# Patient Record
Sex: Female | Born: 1935 | Race: White | Hispanic: No | Smoking: Never smoker
Health system: Southern US, Community
[De-identification: ages and names within clinical notes are randomized; demographics above are authoritative.]

## PROBLEM LIST (undated history)

## (undated) DIAGNOSIS — I10 Essential (primary) hypertension: Secondary | ICD-10-CM

## (undated) DIAGNOSIS — R059 Cough, unspecified: Secondary | ICD-10-CM

## (undated) DIAGNOSIS — T4145XA Adverse effect of unspecified anesthetic, initial encounter: Secondary | ICD-10-CM

## (undated) DIAGNOSIS — D126 Benign neoplasm of colon, unspecified: Secondary | ICD-10-CM

## (undated) DIAGNOSIS — E785 Hyperlipidemia, unspecified: Secondary | ICD-10-CM

## (undated) DIAGNOSIS — N189 Chronic kidney disease, unspecified: Secondary | ICD-10-CM

## (undated) DIAGNOSIS — E119 Type 2 diabetes mellitus without complications: Secondary | ICD-10-CM

## (undated) DIAGNOSIS — M1990 Unspecified osteoarthritis, unspecified site: Secondary | ICD-10-CM

## (undated) DIAGNOSIS — I509 Heart failure, unspecified: Secondary | ICD-10-CM

## (undated) DIAGNOSIS — K219 Gastro-esophageal reflux disease without esophagitis: Secondary | ICD-10-CM

## (undated) DIAGNOSIS — IMO0001 Reserved for inherently not codable concepts without codable children: Secondary | ICD-10-CM

## (undated) DIAGNOSIS — G8929 Other chronic pain: Secondary | ICD-10-CM

## (undated) DIAGNOSIS — F419 Anxiety disorder, unspecified: Secondary | ICD-10-CM

## (undated) DIAGNOSIS — D649 Anemia, unspecified: Secondary | ICD-10-CM

## (undated) DIAGNOSIS — Z950 Presence of cardiac pacemaker: Secondary | ICD-10-CM

## (undated) DIAGNOSIS — I25119 Atherosclerotic heart disease of native coronary artery with unspecified angina pectoris: Secondary | ICD-10-CM

## (undated) DIAGNOSIS — Z79899 Other long term (current) drug therapy: Secondary | ICD-10-CM

## (undated) DIAGNOSIS — M722 Plantar fascial fibromatosis: Secondary | ICD-10-CM

## (undated) DIAGNOSIS — R05 Cough: Secondary | ICD-10-CM

## (undated) HISTORY — PX: ABDOMINAL HYSTERECTOMY: SHX81

## (undated) HISTORY — PX: ROTATOR CUFF REPAIR: SHX139

## (undated) HISTORY — PX: JOINT REPLACEMENT: SHX530

## (undated) HISTORY — PX: VEIN LIGATION AND STRIPPING: SHX2653

## (undated) HISTORY — PX: CATARACT EXTRACTION W/ INTRAOCULAR LENS  IMPLANT, BILATERAL: SHX1307

## (undated) HISTORY — PX: TOTAL HIP ARTHROPLASTY: SHX124

## (undated) HISTORY — PX: TOTAL KNEE ARTHROPLASTY: SHX125

## (undated) HISTORY — PX: BACK SURGERY: SHX140

---

## 1979-03-08 DIAGNOSIS — T8859XA Other complications of anesthesia, initial encounter: Secondary | ICD-10-CM

## 1979-03-08 HISTORY — DX: Other complications of anesthesia, initial encounter: T88.59XA

## 2003-12-28 ENCOUNTER — Other Ambulatory Visit: Payer: Self-pay

## 2004-08-15 ENCOUNTER — Ambulatory Visit: Payer: Self-pay | Admitting: Unknown Physician Specialty

## 2004-08-29 ENCOUNTER — Ambulatory Visit: Payer: Self-pay | Admitting: Gastroenterology

## 2004-09-05 ENCOUNTER — Encounter: Payer: Self-pay | Admitting: Rheumatology

## 2004-11-06 ENCOUNTER — Ambulatory Visit: Payer: Self-pay | Admitting: Internal Medicine

## 2005-04-04 ENCOUNTER — Ambulatory Visit: Payer: Self-pay | Admitting: Unknown Physician Specialty

## 2005-05-20 ENCOUNTER — Inpatient Hospital Stay: Payer: Self-pay | Admitting: Unknown Physician Specialty

## 2005-06-14 ENCOUNTER — Emergency Department: Payer: Self-pay | Admitting: Emergency Medicine

## 2005-11-25 ENCOUNTER — Ambulatory Visit: Payer: Self-pay | Admitting: Internal Medicine

## 2006-06-26 ENCOUNTER — Other Ambulatory Visit: Payer: Self-pay

## 2006-07-07 HISTORY — PX: CORONARY ARTERY BYPASS GRAFT: SHX141

## 2006-07-08 ENCOUNTER — Other Ambulatory Visit: Payer: Self-pay

## 2006-07-08 ENCOUNTER — Inpatient Hospital Stay: Payer: Self-pay | Admitting: Unknown Physician Specialty

## 2006-07-09 ENCOUNTER — Other Ambulatory Visit: Payer: Self-pay

## 2006-07-21 ENCOUNTER — Encounter: Payer: Self-pay | Admitting: Unknown Physician Specialty

## 2006-08-07 ENCOUNTER — Encounter: Payer: Self-pay | Admitting: Unknown Physician Specialty

## 2006-09-01 ENCOUNTER — Encounter: Payer: Self-pay | Admitting: Unknown Physician Specialty

## 2006-09-05 ENCOUNTER — Encounter: Payer: Self-pay | Admitting: Unknown Physician Specialty

## 2006-09-26 ENCOUNTER — Other Ambulatory Visit: Payer: Self-pay

## 2006-09-26 ENCOUNTER — Inpatient Hospital Stay: Payer: Self-pay | Admitting: Internal Medicine

## 2006-12-05 ENCOUNTER — Inpatient Hospital Stay: Payer: Self-pay | Admitting: Internal Medicine

## 2006-12-05 ENCOUNTER — Other Ambulatory Visit: Payer: Self-pay

## 2007-05-19 ENCOUNTER — Ambulatory Visit: Payer: Self-pay | Admitting: Internal Medicine

## 2007-12-23 ENCOUNTER — Encounter: Payer: Self-pay | Admitting: Unknown Physician Specialty

## 2008-01-05 ENCOUNTER — Encounter: Payer: Self-pay | Admitting: Unknown Physician Specialty

## 2008-03-29 ENCOUNTER — Ambulatory Visit: Payer: Self-pay | Admitting: Internal Medicine

## 2008-05-22 ENCOUNTER — Ambulatory Visit: Payer: Self-pay | Admitting: Internal Medicine

## 2009-01-05 ENCOUNTER — Ambulatory Visit: Payer: Self-pay | Admitting: Unknown Physician Specialty

## 2009-04-12 ENCOUNTER — Ambulatory Visit: Payer: Self-pay | Admitting: Unknown Physician Specialty

## 2009-05-25 ENCOUNTER — Ambulatory Visit: Payer: Self-pay | Admitting: Internal Medicine

## 2009-11-27 ENCOUNTER — Ambulatory Visit: Payer: Self-pay | Admitting: Internal Medicine

## 2009-11-29 ENCOUNTER — Ambulatory Visit: Payer: Self-pay | Admitting: Orthopedic Surgery

## 2010-05-28 ENCOUNTER — Ambulatory Visit: Payer: Self-pay | Admitting: Internal Medicine

## 2010-06-18 ENCOUNTER — Ambulatory Visit: Payer: Self-pay | Admitting: Internal Medicine

## 2011-02-17 ENCOUNTER — Ambulatory Visit: Payer: Self-pay | Admitting: Unknown Physician Specialty

## 2011-02-25 ENCOUNTER — Ambulatory Visit: Payer: Self-pay | Admitting: Unknown Physician Specialty

## 2011-06-03 ENCOUNTER — Ambulatory Visit: Payer: Self-pay | Admitting: Internal Medicine

## 2012-01-28 ENCOUNTER — Ambulatory Visit: Payer: Self-pay | Admitting: Ophthalmology

## 2012-05-19 ENCOUNTER — Ambulatory Visit: Payer: Self-pay | Admitting: Internal Medicine

## 2012-06-08 ENCOUNTER — Ambulatory Visit: Payer: Self-pay | Admitting: Internal Medicine

## 2012-06-16 ENCOUNTER — Ambulatory Visit: Payer: Self-pay | Admitting: Gastroenterology

## 2012-11-04 ENCOUNTER — Ambulatory Visit: Payer: Self-pay

## 2013-06-09 ENCOUNTER — Ambulatory Visit: Payer: Self-pay | Admitting: Internal Medicine

## 2013-11-25 ENCOUNTER — Emergency Department: Payer: Self-pay | Admitting: Emergency Medicine

## 2013-11-25 LAB — URINALYSIS, COMPLETE
BLOOD: NEGATIVE
Bilirubin,UR: NEGATIVE
Glucose,UR: NEGATIVE mg/dL (ref 0–75)
Ketone: NEGATIVE
Nitrite: POSITIVE
Ph: 5 (ref 4.5–8.0)
Protein: 100
RBC,UR: 7 /HPF (ref 0–5)
SPECIFIC GRAVITY: 1.019 (ref 1.003–1.030)
WBC UR: 34 /HPF (ref 0–5)

## 2013-12-06 DIAGNOSIS — D649 Anemia, unspecified: Secondary | ICD-10-CM | POA: Insufficient documentation

## 2013-12-06 DIAGNOSIS — E1142 Type 2 diabetes mellitus with diabetic polyneuropathy: Secondary | ICD-10-CM

## 2014-07-26 ENCOUNTER — Inpatient Hospital Stay (HOSPITAL_COMMUNITY)
Admission: EM | Admit: 2014-07-26 | Discharge: 2014-07-31 | DRG: 493 | Disposition: A | Discharge status: Skilled Nursing Facility | Payer: Commercial Managed Care - HMO | Attending: Internal Medicine | Admitting: Internal Medicine

## 2014-07-26 DIAGNOSIS — R221 Localized swelling, mass and lump, neck: Secondary | ICD-10-CM | POA: Diagnosis present

## 2014-07-26 DIAGNOSIS — S82899A Other fracture of unspecified lower leg, initial encounter for closed fracture: Secondary | ICD-10-CM | POA: Diagnosis present

## 2014-07-26 DIAGNOSIS — R1010 Upper abdominal pain, unspecified: Secondary | ICD-10-CM | POA: Diagnosis not present

## 2014-07-26 DIAGNOSIS — E119 Type 2 diabetes mellitus without complications: Secondary | ICD-10-CM | POA: Diagnosis not present

## 2014-07-26 DIAGNOSIS — S2242XA Multiple fractures of ribs, left side, initial encounter for closed fracture: Secondary | ICD-10-CM | POA: Diagnosis present

## 2014-07-26 DIAGNOSIS — S2249XA Multiple fractures of ribs, unspecified side, initial encounter for closed fracture: Secondary | ICD-10-CM | POA: Diagnosis present

## 2014-07-26 DIAGNOSIS — E785 Hyperlipidemia, unspecified: Secondary | ICD-10-CM | POA: Diagnosis present

## 2014-07-26 DIAGNOSIS — R220 Localized swelling, mass and lump, head: Secondary | ICD-10-CM | POA: Diagnosis not present

## 2014-07-26 DIAGNOSIS — Z7982 Long term (current) use of aspirin: Secondary | ICD-10-CM

## 2014-07-26 DIAGNOSIS — Z79899 Other long term (current) drug therapy: Secondary | ICD-10-CM

## 2014-07-26 DIAGNOSIS — E1142 Type 2 diabetes mellitus with diabetic polyneuropathy: Secondary | ICD-10-CM | POA: Diagnosis present

## 2014-07-26 DIAGNOSIS — S2239XA Fracture of one rib, unspecified side, initial encounter for closed fracture: Secondary | ICD-10-CM

## 2014-07-26 DIAGNOSIS — M25561 Pain in right knee: Secondary | ICD-10-CM | POA: Diagnosis not present

## 2014-07-26 DIAGNOSIS — Z794 Long term (current) use of insulin: Secondary | ICD-10-CM

## 2014-07-26 DIAGNOSIS — F419 Anxiety disorder, unspecified: Secondary | ICD-10-CM | POA: Diagnosis present

## 2014-07-26 DIAGNOSIS — S3991XA Unspecified injury of abdomen, initial encounter: Secondary | ICD-10-CM | POA: Diagnosis not present

## 2014-07-26 DIAGNOSIS — Z96651 Presence of right artificial knee joint: Secondary | ICD-10-CM | POA: Diagnosis present

## 2014-07-26 DIAGNOSIS — S2249XD Multiple fractures of ribs, unspecified side, subsequent encounter for fracture with routine healing: Secondary | ICD-10-CM | POA: Diagnosis not present

## 2014-07-26 DIAGNOSIS — S92001A Unspecified fracture of right calcaneus, initial encounter for closed fracture: Secondary | ICD-10-CM | POA: Diagnosis present

## 2014-07-26 DIAGNOSIS — S8991XA Unspecified injury of right lower leg, initial encounter: Secondary | ICD-10-CM | POA: Diagnosis not present

## 2014-07-26 DIAGNOSIS — I2510 Atherosclerotic heart disease of native coronary artery without angina pectoris: Secondary | ICD-10-CM | POA: Diagnosis present

## 2014-07-26 DIAGNOSIS — S82841A Displaced bimalleolar fracture of right lower leg, initial encounter for closed fracture: Principal | ICD-10-CM | POA: Diagnosis present

## 2014-07-26 DIAGNOSIS — S2232XA Fracture of one rib, left side, initial encounter for closed fracture: Secondary | ICD-10-CM | POA: Diagnosis not present

## 2014-07-26 DIAGNOSIS — R262 Difficulty in walking, not elsewhere classified: Secondary | ICD-10-CM | POA: Diagnosis not present

## 2014-07-26 DIAGNOSIS — S92001B Unspecified fracture of right calcaneus, initial encounter for open fracture: Secondary | ICD-10-CM | POA: Diagnosis not present

## 2014-07-26 DIAGNOSIS — S199XXA Unspecified injury of neck, initial encounter: Secondary | ICD-10-CM | POA: Diagnosis not present

## 2014-07-26 DIAGNOSIS — G8918 Other acute postprocedural pain: Secondary | ICD-10-CM | POA: Diagnosis not present

## 2014-07-26 DIAGNOSIS — M6281 Muscle weakness (generalized): Secondary | ICD-10-CM | POA: Diagnosis not present

## 2014-07-26 DIAGNOSIS — S82391A Other fracture of lower end of right tibia, initial encounter for closed fracture: Secondary | ICD-10-CM | POA: Diagnosis not present

## 2014-07-26 DIAGNOSIS — S82891S Other fracture of right lower leg, sequela: Secondary | ICD-10-CM | POA: Diagnosis not present

## 2014-07-26 DIAGNOSIS — Z9889 Other specified postprocedural states: Secondary | ICD-10-CM | POA: Diagnosis not present

## 2014-07-26 DIAGNOSIS — E1140 Type 2 diabetes mellitus with diabetic neuropathy, unspecified: Secondary | ICD-10-CM | POA: Diagnosis not present

## 2014-07-26 DIAGNOSIS — T148 Other injury of unspecified body region: Secondary | ICD-10-CM | POA: Diagnosis not present

## 2014-07-26 DIAGNOSIS — I4891 Unspecified atrial fibrillation: Secondary | ICD-10-CM | POA: Diagnosis not present

## 2014-07-26 DIAGNOSIS — S0990XA Unspecified injury of head, initial encounter: Secondary | ICD-10-CM | POA: Diagnosis not present

## 2014-07-26 DIAGNOSIS — S82899D Other fracture of unspecified lower leg, subsequent encounter for closed fracture with routine healing: Secondary | ICD-10-CM | POA: Diagnosis not present

## 2014-07-26 DIAGNOSIS — I10 Essential (primary) hypertension: Secondary | ICD-10-CM | POA: Diagnosis present

## 2014-07-26 DIAGNOSIS — G894 Chronic pain syndrome: Secondary | ICD-10-CM | POA: Diagnosis present

## 2014-07-26 DIAGNOSIS — IMO0001 Reserved for inherently not codable concepts without codable children: Secondary | ICD-10-CM

## 2014-07-26 DIAGNOSIS — E782 Mixed hyperlipidemia: Secondary | ICD-10-CM | POA: Diagnosis not present

## 2014-07-26 DIAGNOSIS — S82891A Other fracture of right lower leg, initial encounter for closed fracture: Secondary | ICD-10-CM | POA: Diagnosis not present

## 2014-07-26 DIAGNOSIS — S8261XA Displaced fracture of lateral malleolus of right fibula, initial encounter for closed fracture: Secondary | ICD-10-CM | POA: Diagnosis not present

## 2014-07-26 DIAGNOSIS — S92001D Unspecified fracture of right calcaneus, subsequent encounter for fracture with routine healing: Secondary | ICD-10-CM | POA: Diagnosis not present

## 2014-07-26 DIAGNOSIS — K118 Other diseases of salivary glands: Secondary | ICD-10-CM | POA: Diagnosis present

## 2014-07-26 DIAGNOSIS — S82841B Displaced bimalleolar fracture of right lower leg, initial encounter for open fracture type I or II: Secondary | ICD-10-CM | POA: Diagnosis not present

## 2014-07-26 DIAGNOSIS — S9304XA Dislocation of right ankle joint, initial encounter: Secondary | ICD-10-CM | POA: Diagnosis not present

## 2014-07-26 DIAGNOSIS — S2241XA Multiple fractures of ribs, right side, initial encounter for closed fracture: Secondary | ICD-10-CM | POA: Diagnosis not present

## 2014-07-26 DIAGNOSIS — M21271 Flexion deformity, right ankle and toes: Secondary | ICD-10-CM | POA: Diagnosis not present

## 2014-07-26 DIAGNOSIS — S2249XS Multiple fractures of ribs, unspecified side, sequela: Secondary | ICD-10-CM | POA: Diagnosis not present

## 2014-07-26 DIAGNOSIS — D649 Anemia, unspecified: Secondary | ICD-10-CM | POA: Diagnosis not present

## 2014-07-26 DIAGNOSIS — S92011A Displaced fracture of body of right calcaneus, initial encounter for closed fracture: Secondary | ICD-10-CM | POA: Diagnosis not present

## 2014-07-26 HISTORY — DX: Hyperlipidemia, unspecified: E78.5

## 2014-07-26 HISTORY — DX: Anemia, unspecified: D64.9

## 2014-07-26 HISTORY — DX: Other chronic pain: G89.29

## 2014-07-26 HISTORY — DX: Cough: R05

## 2014-07-26 HISTORY — DX: Anxiety disorder, unspecified: F41.9

## 2014-07-26 HISTORY — DX: Other long term (current) drug therapy: Z79.899

## 2014-07-26 HISTORY — DX: Atherosclerotic heart disease of native coronary artery with unspecified angina pectoris: I25.119

## 2014-07-26 HISTORY — DX: Cough, unspecified: R05.9

## 2014-07-26 HISTORY — DX: Benign neoplasm of colon, unspecified: D12.6

## 2014-07-26 HISTORY — DX: Type 2 diabetes mellitus without complications: E11.9

## 2014-07-26 HISTORY — DX: Essential (primary) hypertension: I10

## 2014-07-26 HISTORY — DX: Plantar fascial fibromatosis: M72.2

## 2014-07-26 NOTE — ED Notes (Signed)
Dr. yelverton at the bedside.  
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
## 2014-07-27 ENCOUNTER — Emergency Department (HOSPITAL_COMMUNITY): Payer: Commercial Managed Care - HMO

## 2014-07-27 ENCOUNTER — Encounter (HOSPITAL_COMMUNITY): Payer: Self-pay | Admitting: Emergency Medicine

## 2014-07-27 ENCOUNTER — Inpatient Hospital Stay (HOSPITAL_COMMUNITY): Payer: Commercial Managed Care - HMO | Admitting: Anesthesiology

## 2014-07-27 ENCOUNTER — Encounter (HOSPITAL_COMMUNITY)
Admission: EM | Disposition: A | Discharge status: Skilled Nursing Facility | Payer: Self-pay | Source: Home / Self Care | Attending: Internal Medicine

## 2014-07-27 DIAGNOSIS — Z7982 Long term (current) use of aspirin: Secondary | ICD-10-CM | POA: Diagnosis not present

## 2014-07-27 DIAGNOSIS — Z79899 Other long term (current) drug therapy: Secondary | ICD-10-CM | POA: Diagnosis not present

## 2014-07-27 DIAGNOSIS — S82899A Other fracture of unspecified lower leg, initial encounter for closed fracture: Secondary | ICD-10-CM | POA: Diagnosis present

## 2014-07-27 DIAGNOSIS — S2249XA Multiple fractures of ribs, unspecified side, initial encounter for closed fracture: Secondary | ICD-10-CM | POA: Diagnosis present

## 2014-07-27 DIAGNOSIS — E1142 Type 2 diabetes mellitus with diabetic polyneuropathy: Secondary | ICD-10-CM | POA: Diagnosis present

## 2014-07-27 DIAGNOSIS — I10 Essential (primary) hypertension: Secondary | ICD-10-CM | POA: Diagnosis present

## 2014-07-27 DIAGNOSIS — Z794 Long term (current) use of insulin: Secondary | ICD-10-CM

## 2014-07-27 DIAGNOSIS — I2510 Atherosclerotic heart disease of native coronary artery without angina pectoris: Secondary | ICD-10-CM | POA: Diagnosis present

## 2014-07-27 DIAGNOSIS — S82891A Other fracture of right lower leg, initial encounter for closed fracture: Secondary | ICD-10-CM

## 2014-07-27 DIAGNOSIS — R220 Localized swelling, mass and lump, head: Secondary | ICD-10-CM

## 2014-07-27 DIAGNOSIS — R221 Localized swelling, mass and lump, neck: Secondary | ICD-10-CM | POA: Diagnosis present

## 2014-07-27 DIAGNOSIS — S82841A Displaced bimalleolar fracture of right lower leg, initial encounter for closed fracture: Secondary | ICD-10-CM | POA: Diagnosis present

## 2014-07-27 DIAGNOSIS — S2241XA Multiple fractures of ribs, right side, initial encounter for closed fracture: Secondary | ICD-10-CM

## 2014-07-27 DIAGNOSIS — G894 Chronic pain syndrome: Secondary | ICD-10-CM | POA: Diagnosis present

## 2014-07-27 DIAGNOSIS — IMO0001 Reserved for inherently not codable concepts without codable children: Secondary | ICD-10-CM

## 2014-07-27 DIAGNOSIS — S2242XA Multiple fractures of ribs, left side, initial encounter for closed fracture: Secondary | ICD-10-CM

## 2014-07-27 DIAGNOSIS — Z96651 Presence of right artificial knee joint: Secondary | ICD-10-CM | POA: Diagnosis present

## 2014-07-27 DIAGNOSIS — E785 Hyperlipidemia, unspecified: Secondary | ICD-10-CM | POA: Diagnosis present

## 2014-07-27 DIAGNOSIS — S92001A Unspecified fracture of right calcaneus, initial encounter for closed fracture: Secondary | ICD-10-CM | POA: Diagnosis present

## 2014-07-27 DIAGNOSIS — K118 Other diseases of salivary glands: Secondary | ICD-10-CM | POA: Diagnosis present

## 2014-07-27 DIAGNOSIS — S2239XA Fracture of one rib, unspecified side, initial encounter for closed fracture: Secondary | ICD-10-CM

## 2014-07-27 DIAGNOSIS — F419 Anxiety disorder, unspecified: Secondary | ICD-10-CM | POA: Diagnosis present

## 2014-07-27 DIAGNOSIS — E119 Type 2 diabetes mellitus without complications: Secondary | ICD-10-CM

## 2014-07-27 HISTORY — PX: ORIF ANKLE FRACTURE: SHX5408

## 2014-07-27 LAB — CBC WITH DIFFERENTIAL/PLATELET
BASOS PCT: 0 % (ref 0–1)
BASOS PCT: 0 % (ref 0–1)
Basophils Absolute: 0 10*3/uL (ref 0.0–0.1)
Basophils Absolute: 0 10*3/uL (ref 0.0–0.1)
Eosinophils Absolute: 0 10*3/uL (ref 0.0–0.7)
Eosinophils Absolute: 0.3 10*3/uL (ref 0.0–0.7)
Eosinophils Relative: 0 % (ref 0–5)
Eosinophils Relative: 1 % (ref 0–5)
HEMATOCRIT: 34.1 % — AB (ref 36.0–46.0)
HEMATOCRIT: 40.6 % (ref 36.0–46.0)
Hemoglobin: 11.4 g/dL — ABNORMAL LOW (ref 12.0–15.0)
Hemoglobin: 13.6 g/dL (ref 12.0–15.0)
Lymphocytes Relative: 8 % — ABNORMAL LOW (ref 12–46)
Lymphocytes Relative: 8 % — ABNORMAL LOW (ref 12–46)
Lymphs Abs: 0.8 10*3/uL (ref 0.7–4.0)
Lymphs Abs: 1.4 10*3/uL (ref 0.7–4.0)
MCH: 31.1 pg (ref 26.0–34.0)
MCH: 31.4 pg (ref 26.0–34.0)
MCHC: 33.4 g/dL (ref 30.0–36.0)
MCHC: 33.5 g/dL (ref 30.0–36.0)
MCV: 92.9 fL (ref 78.0–100.0)
MCV: 93.8 fL (ref 78.0–100.0)
MONO ABS: 1.3 10*3/uL — AB (ref 0.1–1.0)
MONOS PCT: 10 % (ref 3–12)
Monocytes Absolute: 0.9 10*3/uL (ref 0.1–1.0)
Monocytes Relative: 7 % (ref 3–12)
NEUTROS ABS: 8.2 10*3/uL — AB (ref 1.7–7.7)
NEUTROS PCT: 82 % — AB (ref 43–77)
Neutro Abs: 15 10*3/uL — ABNORMAL HIGH (ref 1.7–7.7)
Neutrophils Relative %: 84 % — ABNORMAL HIGH (ref 43–77)
PLATELETS: 265 10*3/uL (ref 150–400)
Platelets: 230 10*3/uL (ref 150–400)
RBC: 3.67 MIL/uL — ABNORMAL LOW (ref 3.87–5.11)
RBC: 4.33 MIL/uL (ref 3.87–5.11)
RDW: 14.3 % (ref 11.5–15.5)
RDW: 14.3 % (ref 11.5–15.5)
WBC: 18 10*3/uL — ABNORMAL HIGH (ref 4.0–10.5)
WBC: 9.9 10*3/uL (ref 4.0–10.5)

## 2014-07-27 LAB — COMPREHENSIVE METABOLIC PANEL
ALBUMIN: 3.9 g/dL (ref 3.5–5.2)
ALK PHOS: 46 U/L (ref 39–117)
ALK PHOS: 57 U/L (ref 39–117)
ALT: 20 U/L (ref 0–35)
ALT: 21 U/L (ref 0–35)
ANION GAP: 11 (ref 5–15)
ANION GAP: 9 (ref 5–15)
AST: 33 U/L (ref 0–37)
AST: 37 U/L (ref 0–37)
Albumin: 3.2 g/dL — ABNORMAL LOW (ref 3.5–5.2)
BILIRUBIN TOTAL: 1 mg/dL (ref 0.3–1.2)
BUN: 19 mg/dL (ref 6–23)
BUN: 19 mg/dL (ref 6–23)
CHLORIDE: 101 meq/L (ref 96–112)
CO2: 25 mmol/L (ref 19–32)
CO2: 27 mmol/L (ref 19–32)
CREATININE: 0.8 mg/dL (ref 0.50–1.10)
Calcium: 8.8 mg/dL (ref 8.4–10.5)
Calcium: 9.7 mg/dL (ref 8.4–10.5)
Chloride: 104 mEq/L (ref 96–112)
Creatinine, Ser: 0.97 mg/dL (ref 0.50–1.10)
GFR calc Af Amer: 61 mL/min — ABNORMAL LOW (ref >90)
GFR calc Af Amer: 77 mL/min — ABNORMAL LOW (ref >90)
GFR calc non Af Amer: 53 mL/min — ABNORMAL LOW (ref >90)
GFR calc non Af Amer: 67 mL/min — ABNORMAL LOW (ref >90)
GLUCOSE: 197 mg/dL — AB (ref 70–99)
Glucose, Bld: 180 mg/dL — ABNORMAL HIGH (ref 70–99)
POTASSIUM: 3.7 mmol/L (ref 3.5–5.1)
Potassium: 3.6 mmol/L (ref 3.5–5.1)
Sodium: 138 mmol/L (ref 135–145)
Sodium: 139 mmol/L (ref 135–145)
TOTAL PROTEIN: 7.4 g/dL (ref 6.0–8.3)
Total Bilirubin: 0.8 mg/dL (ref 0.3–1.2)
Total Protein: 5.8 g/dL — ABNORMAL LOW (ref 6.0–8.3)

## 2014-07-27 LAB — LIPID PANEL
CHOLESTEROL: 168 mg/dL (ref 0–200)
HDL: 44 mg/dL (ref >39)
LDL Cholesterol: 104 mg/dL — ABNORMAL HIGH (ref 0–99)
TRIGLYCERIDES: 99 mg/dL (ref <150)
Total CHOL/HDL Ratio: 3.8 RATIO
VLDL: 20 mg/dL (ref 0–40)

## 2014-07-27 LAB — PROTIME-INR
INR: 0.91 (ref 0.00–1.49)
INR: 1.05 (ref 0.00–1.49)
Prothrombin Time: 12.4 seconds (ref 11.6–15.2)
Prothrombin Time: 13.8 seconds (ref 11.6–15.2)

## 2014-07-27 LAB — GLUCOSE, CAPILLARY
GLUCOSE-CAPILLARY: 159 mg/dL — AB (ref 70–99)
GLUCOSE-CAPILLARY: 163 mg/dL — AB (ref 70–99)
Glucose-Capillary: 185 mg/dL — ABNORMAL HIGH (ref 70–99)
Glucose-Capillary: 166 mg/dL — ABNORMAL HIGH (ref 70–99)
Glucose-Capillary: 140 mg/dL — ABNORMAL HIGH (ref 70–99)
Glucose-Capillary: 166 mg/dL — ABNORMAL HIGH (ref 70–99)
Glucose-Capillary: 166 mg/dL — ABNORMAL HIGH (ref 70–99)

## 2014-07-27 LAB — CBG MONITORING, ED: GLUCOSE-CAPILLARY: 190 mg/dL — AB (ref 70–99)

## 2014-07-27 LAB — HEMOGLOBIN A1C
Hgb A1c MFr Bld: 7 % — ABNORMAL HIGH (ref <5.7)
MEAN PLASMA GLUCOSE: 154 mg/dL — AB (ref <117)

## 2014-07-27 LAB — APTT: aPTT: 20 seconds — ABNORMAL LOW (ref 24–37)

## 2014-07-27 LAB — MRSA PCR SCREENING: MRSA BY PCR: NEGATIVE

## 2014-07-27 LAB — TROPONIN I

## 2014-07-27 LAB — MAGNESIUM: Magnesium: 1.6 mg/dL (ref 1.5–2.5)

## 2014-07-27 SURGERY — OPEN REDUCTION INTERNAL FIXATION (ORIF) ANKLE FRACTURE
Anesthesia: Regional | Site: Ankle | Laterality: Right

## 2014-07-27 SURGERY — OPEN REDUCTION INTERNAL FIXATION (ORIF) ANKLE FRACTURE
Anesthesia: Choice | Laterality: Right

## 2014-07-27 MED ORDER — AMLODIPINE BESYLATE 10 MG PO TABS
10.0000 mg | ORAL_TABLET | Freq: Every day | ORAL | Status: DC
  Administered 2014-07-27 – 2014-07-31 (×5): 10 mg via ORAL
  Filled 2014-07-27 (×4): qty 1
  Filled 2014-07-27: qty 2

## 2014-07-27 MED ORDER — PNEUMOCOCCAL VAC POLYVALENT 25 MCG/0.5ML IJ INJ
0.5000 mL | INJECTION | INTRAMUSCULAR | Status: AC
  Administered 2014-07-28: 0.5 mL via INTRAMUSCULAR
  Filled 2014-07-27: qty 0.5

## 2014-07-27 MED ORDER — PROPOFOL 10 MG/ML IV BOLUS
0.5000 mg/kg | Freq: Once | INTRAVENOUS | Status: AC
  Administered 2014-07-27: 50 mg via INTRAVENOUS
  Filled 2014-07-27: qty 20

## 2014-07-27 MED ORDER — INSULIN REGULAR BOLUS VIA INFUSION
0.0000 [IU] | Freq: Three times a day (TID) | INTRAVENOUS | Status: DC
Start: 2014-07-27 — End: 2014-07-28
  Filled 2014-07-27: qty 10

## 2014-07-27 MED ORDER — ONDANSETRON HCL 4 MG/2ML IJ SOLN: 4.0000 mg | Freq: Four times a day (QID) | INTRAMUSCULAR | Status: DC | PRN

## 2014-07-27 MED ORDER — FENTANYL CITRATE 0.05 MG/ML IJ SOLN
25.0000 ug | INTRAMUSCULAR | Status: DC | PRN
  Administered 2014-07-27 – 2014-07-28 (×5): 50 ug via INTRAVENOUS
  Filled 2014-07-27 (×5): qty 2

## 2014-07-27 MED ORDER — DEXTROSE 50 % IV SOLN: 25.0000 mL | INTRAVENOUS | Status: DC | PRN

## 2014-07-27 MED ORDER — BUPIVACAINE HCL (PF) 0.25 % IJ SOLN
INTRAMUSCULAR | Status: AC
  Filled 2014-07-27: qty 30

## 2014-07-27 MED ORDER — ONDANSETRON HCL 4 MG PO TABS: 4.0000 mg | ORAL_TABLET | Freq: Four times a day (QID) | ORAL | Status: DC | PRN

## 2014-07-27 MED ORDER — FERROUS FUMARATE 325 (106 FE) MG PO TABS
1.0000 | ORAL_TABLET | Freq: Every day | ORAL | Status: DC
  Administered 2014-07-27 – 2014-07-31 (×5): 106 mg via ORAL
  Filled 2014-07-27 (×5): qty 1

## 2014-07-27 MED ORDER — MEPIVACAINE HCL 1.5 % IJ SOLN
INTRAMUSCULAR | Status: DC | PRN
  Administered 2014-07-27 (×2): 5 mL via EPIDURAL

## 2014-07-27 MED ORDER — LACTATED RINGERS IV SOLN
INTRAVENOUS | Status: DC | PRN
  Administered 2014-07-27: 12:00:00 via INTRAVENOUS

## 2014-07-27 MED ORDER — ALBUMIN HUMAN 5 % IV SOLN
INTRAVENOUS | Status: DC | PRN
  Administered 2014-07-27: 13:00:00 via INTRAVENOUS

## 2014-07-27 MED ORDER — PRAVASTATIN SODIUM 40 MG PO TABS
40.0000 mg | ORAL_TABLET | Freq: Every day | ORAL | Status: DC
  Administered 2014-07-27 – 2014-07-30 (×4): 40 mg via ORAL
  Filled 2014-07-27 (×5): qty 1

## 2014-07-27 MED ORDER — PHENYLEPHRINE HCL 10 MG/ML IJ SOLN
10.0000 mg | INTRAVENOUS | Status: DC | PRN
  Administered 2014-07-27: 15 ug/min via INTRAVENOUS

## 2014-07-27 MED ORDER — LIDOCAINE HCL (CARDIAC) 20 MG/ML IV SOLN
INTRAVENOUS | Status: DC | PRN
  Administered 2014-07-27: 50 mg via INTRAVENOUS

## 2014-07-27 MED ORDER — DEXTROSE 5 % IV SOLN
500.0000 mg | Freq: Four times a day (QID) | INTRAVENOUS | Status: DC | PRN
  Filled 2014-07-27: qty 5

## 2014-07-27 MED ORDER — FENTANYL CITRATE 0.05 MG/ML IJ SOLN
INTRAMUSCULAR | Status: AC
  Administered 2014-07-27: 50 ug via INTRAVENOUS
  Filled 2014-07-27: qty 2

## 2014-07-27 MED ORDER — CEFAZOLIN SODIUM-DEXTROSE 2-3 GM-% IV SOLR
2.0000 g | INTRAVENOUS | Status: AC
  Administered 2014-07-27: 2 g via INTRAVENOUS
  Filled 2014-07-27: qty 50

## 2014-07-27 MED ORDER — LOSARTAN POTASSIUM 50 MG PO TABS
100.0000 mg | ORAL_TABLET | Freq: Every day | ORAL | Status: DC
  Administered 2014-07-27 – 2014-07-31 (×5): 100 mg via ORAL
  Filled 2014-07-27 (×5): qty 2

## 2014-07-27 MED ORDER — SODIUM CHLORIDE 0.9 % IV SOLN
INTRAVENOUS | Status: DC
  Administered 2014-07-27: 1.3 [IU]/h via INTRAVENOUS
  Administered 2014-07-27: 0.8 [IU]/h via INTRAVENOUS
  Filled 2014-07-27: qty 2.5

## 2014-07-27 MED ORDER — PROPOFOL 10 MG/ML IV BOLUS
INTRAVENOUS | Status: DC | PRN
  Administered 2014-07-27: 10 mg via INTRAVENOUS

## 2014-07-27 MED ORDER — FENTANYL CITRATE 0.05 MG/ML IJ SOLN
50.0000 ug | Freq: Once | INTRAMUSCULAR | Status: AC
  Administered 2014-07-27 (×2): 50 ug via INTRAVENOUS
  Filled 2014-07-27: qty 2

## 2014-07-27 MED ORDER — DOCUSATE SODIUM 100 MG PO CAPS
100.0000 mg | ORAL_CAPSULE | Freq: Every day | ORAL | Status: DC
  Administered 2014-07-27 – 2014-07-31 (×5): 100 mg via ORAL
  Filled 2014-07-27 (×5): qty 1

## 2014-07-27 MED ORDER — ALPRAZOLAM 0.5 MG PO TABS: 1.0000 mg | ORAL_TABLET | Freq: Every evening | ORAL | Status: DC | PRN

## 2014-07-27 MED ORDER — GABAPENTIN 100 MG PO CAPS
100.0000 mg | ORAL_CAPSULE | Freq: Two times a day (BID) | ORAL | Status: DC
  Administered 2014-07-27 – 2014-07-31 (×9): 100 mg via ORAL
  Filled 2014-07-27 (×10): qty 1

## 2014-07-27 MED ORDER — METHOCARBAMOL 500 MG PO TABS
500.0000 mg | ORAL_TABLET | Freq: Four times a day (QID) | ORAL | Status: DC | PRN
  Administered 2014-07-29 – 2014-07-31 (×4): 500 mg via ORAL
  Filled 2014-07-27 (×5): qty 1

## 2014-07-27 MED ORDER — PROMETHAZINE HCL 25 MG/ML IJ SOLN: 6.2500 mg | INTRAMUSCULAR | Status: DC | PRN

## 2014-07-27 MED ORDER — CEFAZOLIN SODIUM-DEXTROSE 2-3 GM-% IV SOLR
2.0000 g | Freq: Four times a day (QID) | INTRAVENOUS | Status: AC
  Administered 2014-07-27 – 2014-07-29 (×6): 2 g via INTRAVENOUS
  Filled 2014-07-27 (×6): qty 50

## 2014-07-27 MED ORDER — CHLORHEXIDINE GLUCONATE 4 % EX LIQD: 60.0000 mL | Freq: Once | CUTANEOUS | Status: DC

## 2014-07-27 MED ORDER — OXYCODONE-ACETAMINOPHEN 5-325 MG PO TABS
1.0000 | ORAL_TABLET | Freq: Four times a day (QID) | ORAL | Status: DC | PRN
  Administered 2014-07-27: 1 via ORAL
  Administered 2014-07-28 – 2014-07-30 (×8): 2 via ORAL
  Administered 2014-07-31: 1 via ORAL
  Filled 2014-07-27 (×5): qty 2
  Filled 2014-07-27: qty 1
  Filled 2014-07-27: qty 2
  Filled 2014-07-27: qty 1
  Filled 2014-07-27 (×2): qty 2

## 2014-07-27 MED ORDER — INSULIN ASPART 100 UNIT/ML ~~LOC~~ SOLN
0.0000 [IU] | SUBCUTANEOUS | Status: DC
  Administered 2014-07-27 – 2014-07-28 (×3): 2 [IU] via SUBCUTANEOUS
  Administered 2014-07-28: 1 [IU] via SUBCUTANEOUS
  Administered 2014-07-28: 2 [IU] via SUBCUTANEOUS

## 2014-07-27 MED ORDER — ENSURE COMPLETE PO LIQD
237.0000 mL | Freq: Two times a day (BID) | ORAL | Status: DC
  Administered 2014-07-28 – 2014-07-31 (×7): 237 mL via ORAL

## 2014-07-27 MED ORDER — PANTOPRAZOLE SODIUM 40 MG PO TBEC
40.0000 mg | DELAYED_RELEASE_TABLET | Freq: Every day | ORAL | Status: DC
  Administered 2014-07-27 – 2014-07-31 (×5): 40 mg via ORAL
  Filled 2014-07-27 (×5): qty 1

## 2014-07-27 MED ORDER — DEXTROSE-NACL 5-0.45 % IV SOLN: INTRAVENOUS | Status: DC

## 2014-07-27 MED ORDER — ROPIVACAINE HCL 5 MG/ML IJ SOLN
INTRAMUSCULAR | Status: DC | PRN
  Administered 2014-07-27 (×2): 15 mL via PERINEURAL

## 2014-07-27 MED ORDER — SODIUM CHLORIDE 0.9 % IV SOLN
INTRAVENOUS | Status: DC
  Administered 2014-07-27 – 2014-07-28 (×3): via INTRAVENOUS

## 2014-07-27 MED ORDER — 0.9 % SODIUM CHLORIDE (POUR BTL) OPTIME
TOPICAL | Status: DC | PRN
  Administered 2014-07-27: 1000 mL

## 2014-07-27 MED ORDER — ASPIRIN EC 325 MG PO TBEC
325.0000 mg | DELAYED_RELEASE_TABLET | Freq: Every day | ORAL | Status: DC
  Administered 2014-07-27 – 2014-07-29 (×3): 325 mg via ORAL
  Filled 2014-07-27 (×3): qty 1

## 2014-07-27 MED ORDER — MEPERIDINE HCL 25 MG/ML IJ SOLN: 6.2500 mg | INTRAMUSCULAR | Status: DC | PRN

## 2014-07-27 SURGICAL SUPPLY — 43 items
BANDAGE ELASTIC 6 VELCRO ST LF (GAUZE/BANDAGES/DRESSINGS) IMPLANT
BANDAGE ESMARK 6X9 LF (GAUZE/BANDAGES/DRESSINGS) ×1 IMPLANT
BLADE SURG 10 STRL SS (BLADE) ×3 IMPLANT
BLADE SURG ROTATE 9660 (MISCELLANEOUS) IMPLANT
BNDG ESMARK 6X9 LF (GAUZE/BANDAGES/DRESSINGS) ×3
COVER MAYO STAND STRL (DRAPES) ×3 IMPLANT
COVER SURGICAL LIGHT HANDLE (MISCELLANEOUS) ×3 IMPLANT
CUFF TOURNIQUET SINGLE 34IN LL (TOURNIQUET CUFF) IMPLANT
CUFF TOURNIQUET SINGLE 44IN (TOURNIQUET CUFF) IMPLANT
DRAPE OEC MINIVIEW 54X84 (DRAPES) IMPLANT
DRAPE U-SHAPE 47X51 STRL (DRAPES) ×3 IMPLANT
DURAPREP 26ML APPLICATOR (WOUND CARE) ×3 IMPLANT
ELECT REM PT RETURN 9FT ADLT (ELECTROSURGICAL) ×3
ELECTRODE REM PT RTRN 9FT ADLT (ELECTROSURGICAL) ×1 IMPLANT
GAUZE SPONGE 4X4 12PLY STRL (GAUZE/BANDAGES/DRESSINGS) IMPLANT
GAUZE XEROFORM 1X8 LF (GAUZE/BANDAGES/DRESSINGS) IMPLANT
GLOVE BIOGEL PI IND STRL 8 (GLOVE) ×2 IMPLANT
GLOVE BIOGEL PI INDICATOR 8 (GLOVE) ×4
GLOVE ECLIPSE 7.5 STRL STRAW (GLOVE) ×6 IMPLANT
GOWN STRL REUS W/ TWL LRG LVL3 (GOWN DISPOSABLE) ×1 IMPLANT
GOWN STRL REUS W/ TWL XL LVL3 (GOWN DISPOSABLE) ×2 IMPLANT
GOWN STRL REUS W/TWL LRG LVL3 (GOWN DISPOSABLE) ×2
GOWN STRL REUS W/TWL XL LVL3 (GOWN DISPOSABLE) ×4
KIT BASIN OR (CUSTOM PROCEDURE TRAY) ×3 IMPLANT
KIT ROOM TURNOVER OR (KITS) ×3 IMPLANT
MANIFOLD NEPTUNE II (INSTRUMENTS) ×3 IMPLANT
NS IRRIG 1000ML POUR BTL (IV SOLUTION) ×3 IMPLANT
PACK ORTHO EXTREMITY (CUSTOM PROCEDURE TRAY) ×3 IMPLANT
PAD ARMBOARD 7.5X6 YLW CONV (MISCELLANEOUS) ×6 IMPLANT
PAD CAST 4YDX4 CTTN HI CHSV (CAST SUPPLIES) IMPLANT
PADDING CAST COTTON 4X4 STRL (CAST SUPPLIES)
SPONGE LAP 4X18 X RAY DECT (DISPOSABLE) ×6 IMPLANT
STAPLER VISISTAT 35W (STAPLE) IMPLANT
SUCTION FRAZIER TIP 10 FR DISP (SUCTIONS) ×3 IMPLANT
SUT ETHILON 4 0 PS 2 18 (SUTURE) IMPLANT
SUT VIC AB 0 CTB1 27 (SUTURE) IMPLANT
SUT VIC AB 2-0 FS1 27 (SUTURE) IMPLANT
SYR CONTROL 10ML LL (SYRINGE) IMPLANT
TOWEL OR 17X24 6PK STRL BLUE (TOWEL DISPOSABLE) ×3 IMPLANT
TOWEL OR 17X26 10 PK STRL BLUE (TOWEL DISPOSABLE) ×3 IMPLANT
TUBE CONNECTING 12'X1/4 (SUCTIONS) ×1
TUBE CONNECTING 12X1/4 (SUCTIONS) ×2 IMPLANT
WATER STERILE IRR 1000ML POUR (IV SOLUTION) ×3 IMPLANT

## 2014-07-27 SURGICAL SUPPLY — 67 items
BANDAGE ELASTIC 6 VELCRO ST LF (GAUZE/BANDAGES/DRESSINGS) ×4 IMPLANT
BANDAGE ESMARK 6X9 LF (GAUZE/BANDAGES/DRESSINGS) ×1 IMPLANT
BIT DRILL 2.5X2.75 QC CALB (BIT) ×2 IMPLANT
BIT DRILL 2.9 CANN QC NONSTRL (BIT) ×2 IMPLANT
BIT DRILL CALIBRATED 2.7 (BIT) ×2 IMPLANT
BLADE SURG 10 STRL SS (BLADE) ×2 IMPLANT
BLADE SURG ROTATE 9660 (MISCELLANEOUS) IMPLANT
BNDG ESMARK 6X9 LF (GAUZE/BANDAGES/DRESSINGS) ×2
CANISTER SUCTION 2500CC (MISCELLANEOUS) ×2 IMPLANT
CHLORAPREP W/TINT 26ML (MISCELLANEOUS) ×2 IMPLANT
COVER MAYO STAND STRL (DRAPES) IMPLANT
COVER SURGICAL LIGHT HANDLE (MISCELLANEOUS) ×2 IMPLANT
CUFF TOURNIQUET SINGLE 34IN LL (TOURNIQUET CUFF) ×2 IMPLANT
CUFF TOURNIQUET SINGLE 44IN (TOURNIQUET CUFF) IMPLANT
DRAPE OEC MINIVIEW 54X84 (DRAPES) ×2 IMPLANT
DRAPE U-SHAPE 47X51 STRL (DRAPES) ×2 IMPLANT
DURAPREP 26ML APPLICATOR (WOUND CARE) IMPLANT
ELECT REM PT RETURN 9FT ADLT (ELECTROSURGICAL) ×2
ELECTRODE REM PT RTRN 9FT ADLT (ELECTROSURGICAL) ×1 IMPLANT
GAUZE SPONGE 4X4 12PLY STRL (GAUZE/BANDAGES/DRESSINGS) ×2 IMPLANT
GAUZE XEROFORM 1X8 LF (GAUZE/BANDAGES/DRESSINGS) IMPLANT
GAUZE XEROFORM 5X9 LF (GAUZE/BANDAGES/DRESSINGS) ×4 IMPLANT
GLOVE BIOGEL PI IND STRL 8 (GLOVE) ×2 IMPLANT
GLOVE BIOGEL PI INDICATOR 8 (GLOVE) ×2
GLOVE ECLIPSE 7.5 STRL STRAW (GLOVE) ×4 IMPLANT
GOWN STRL REUS W/ TWL LRG LVL3 (GOWN DISPOSABLE) ×1 IMPLANT
GOWN STRL REUS W/ TWL XL LVL3 (GOWN DISPOSABLE) ×2 IMPLANT
GOWN STRL REUS W/TWL LRG LVL3 (GOWN DISPOSABLE) ×1
GOWN STRL REUS W/TWL XL LVL3 (GOWN DISPOSABLE) ×2
KIT BASIN OR (CUSTOM PROCEDURE TRAY) ×2 IMPLANT
KIT ROOM TURNOVER OR (KITS) ×2 IMPLANT
MANIFOLD NEPTUNE II (INSTRUMENTS) IMPLANT
NS IRRIG 1000ML POUR BTL (IV SOLUTION) ×2 IMPLANT
PACK ORTHO EXTREMITY (CUSTOM PROCEDURE TRAY) ×2 IMPLANT
PAD ABD 8X10 STRL (GAUZE/BANDAGES/DRESSINGS) ×6 IMPLANT
PAD ARMBOARD 7.5X6 YLW CONV (MISCELLANEOUS) ×4 IMPLANT
PAD CAST 4YDX4 CTTN HI CHSV (CAST SUPPLIES) ×2 IMPLANT
PADDING CAST COTTON 4X4 STRL (CAST SUPPLIES) ×2
PADDING CAST COTTON 6X4 STRL (CAST SUPPLIES) ×4 IMPLANT
PLATE LOCK 6H 77 BILAT FIB (Plate) ×2 IMPLANT
PLATE TUB 100DEG 5 HO (Plate) ×2 IMPLANT
SCREW ACE CAN 4.0 44M (Screw) ×2 IMPLANT
SCREW CORT FT 32X3.5XNONLOCK (Screw) ×1 IMPLANT
SCREW CORTICAL 3.5MM  28MM (Screw) ×1 IMPLANT
SCREW CORTICAL 3.5MM  32MM (Screw) ×1 IMPLANT
SCREW CORTICAL 3.5MM 14MM (Screw) ×4 IMPLANT
SCREW CORTICAL 3.5MM 26MM (Screw) ×2 IMPLANT
SCREW CORTICAL 3.5MM 28MM (Screw) ×1 IMPLANT
SCREW LOCK 3.5X10 DIST TIB (Screw) ×2 IMPLANT
SCREW LOCK CORT STAR 3.5X10 (Screw) ×2 IMPLANT
SCREW LOCK CORT STAR 3.5X14 (Screw) ×4 IMPLANT
SCREW NLOCK CANC HEX 4X32 (Screw) ×2 IMPLANT
SPLINT FIBERGLASS 4X30 (CAST SUPPLIES) ×4 IMPLANT
SPONGE LAP 4X18 X RAY DECT (DISPOSABLE) ×4 IMPLANT
STAPLER VISISTAT 35W (STAPLE) IMPLANT
SUCTION FRAZIER TIP 10 FR DISP (SUCTIONS) ×2 IMPLANT
SUT ETHILON 4 0 PS 2 18 (SUTURE) ×2 IMPLANT
SUT VIC AB 0 CTB1 27 (SUTURE) ×2 IMPLANT
SUT VIC AB 2-0 FS1 27 (SUTURE) ×2 IMPLANT
SUT VIC AB 3-0 SH 27 (SUTURE) ×3
SUT VIC AB 3-0 SH 27X BRD (SUTURE) ×3 IMPLANT
SYR CONTROL 10ML LL (SYRINGE) IMPLANT
TOWEL OR 17X24 6PK STRL BLUE (TOWEL DISPOSABLE) ×2 IMPLANT
TOWEL OR 17X26 10 PK STRL BLUE (TOWEL DISPOSABLE) ×2 IMPLANT
TUBE CONNECTING 12X1/4 (SUCTIONS) ×2 IMPLANT
WATER STERILE IRR 1000ML POUR (IV SOLUTION) IMPLANT
WIRE K 1.6MM 144256 (MISCELLANEOUS) ×2 IMPLANT

## 2014-07-27 NOTE — Anesthesia Procedure Notes (Addendum)
Anesthesia Regional Block:  Adductor canal block  Pre-Anesthetic Checklist: ,, timeout performed, Correct Patient, Correct Site, Correct Laterality, Correct Procedure, Correct Position, site marked, Risks and benefits discussed, Surgical consent,  Pre-op evaluation,  Post-op pain management  Laterality: Right  Prep: chloraprep       Needles:  Injection technique: Single-shot  Needle Type: Stimulator Needle - 80      Needle Gauge: 22 and 22 G  Needle insertion depth: 6 cm   Additional Needles: Adductor canal block Narrative:  Injection made incrementally with aspirations every 5 mL.  Performed by: Personally  Anesthesiologist: Nolon Nations R  Additional Notes: Artery visualized via ultrasound, good anesthetic spread in arterial fascial plane. Negative aspirations.    Anesthesia Regional Block:  Popliteal block  Pre-Anesthetic Checklist: ,, timeout performed, Correct Patient, Correct Site, Correct Laterality, Correct Procedure, Correct Position, site marked, Risks and benefits discussed, Surgical consent,  Pre-op evaluation,  Post-op pain management  Laterality: Right  Prep: chloraprep       Needles:  Injection technique: Single-shot  Needle Type: Stimiplex     Needle Length: 10cm 10 cm Needle Gauge: 21 and 21 G    Additional Needles:  Procedures: ultrasound guided (picture in chart) and nerve stimulator  Motor weakness within 5 minutes. Popliteal block  Nerve Stimulator or Paresthesia:  Response: Eversion, 0.5 mA,   Additional Responses:   Narrative:  Injection made incrementally with aspirations every 5 mL.  Performed by: Personally   Additional Notes: Nerve located at level of bifurcation. Twitch elicited. Good perineural spread. Pt tolerated well.

## 2014-07-27 NOTE — ED Notes (Signed)
Dr. Lita Mains at the bedside to update plan of care, planning for admission.
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
## 2014-07-27 NOTE — ED Notes (Signed)
Attempted to call report

## 2014-07-27 NOTE — Transfer of Care (Signed)
Immediate Anesthesia Transfer of Care Note  Patient: Amber [name]  Procedure(s) Performed: Procedure(s): OPEN REDUCTION INTERNAL FIXATION (ORIF) ANKLE FRACTURE (Right)  Patient Location: PACU  Anesthesia Type:General  Level of Consciousness: awake, alert  and oriented  Airway & Oxygen Therapy: Patient Spontanous Breathing and Patient connected to nasal cannula oxygen  Post-op Assessment: Report given to PACU RN and Post -op Vital signs reviewed and stable  Post vital signs: Reviewed and stable  Complications: No apparent anesthesia complications

## 2014-07-27 NOTE — Progress Notes (Signed)
Utilization review completed. Keon Benscoter, RN, BSN. 
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
## 2014-07-27 NOTE — ED Notes (Signed)
RT and Ortho tech called and informed we will be performing a reduction.
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
## 2014-07-27 NOTE — Anesthesia Preprocedure Evaluation (Addendum)
Anesthesia Evaluation  Patient identified by MRN, date of birth, ID band Patient awake    Reviewed: Allergy & Precautions, NPO status , Patient's Chart, lab work & pertinent test results  Airway Mallampati: II  TM Distance: >3 FB Neck ROM: Full    Dental no notable dental hx.    Pulmonary neg pulmonary ROS,  breath sounds clear to auscultation  Pulmonary exam normal       Cardiovascular hypertension, Pt. on medications + angina + CAD Rhythm:Regular Rate:Normal     Neuro/Psych PSYCHIATRIC DISORDERS Anxiety negative neurological ROS     GI/Hepatic Neg liver ROS,   Endo/Other  diabetes, Type 2, Insulin Dependent  Renal/GU negative Renal ROS     Musculoskeletal negative musculoskeletal ROS (+)   Abdominal   Peds  Hematology  (+) anemia ,   Anesthesia Other Findings   Reproductive/Obstetrics negative OB ROS                            Anesthesia Physical Anesthesia Plan  ASA: III  Anesthesia Plan: Regional   Post-op Pain Management:    Induction: Intravenous  Airway Management Planned:   Additional Equipment:   Intra-op Plan:   Post-operative Plan: Extubation in OR  Informed Consent: I have reviewed the patients History and Physical, chart, labs and discussed the procedure including the risks, benefits and alternatives for the proposed anesthesia with the patient or authorized representative who has indicated his/her understanding and acceptance.   Dental advisory given  Plan Discussed with: CRNA  Anesthesia Plan Comments:         Anesthesia Quick Evaluation

## 2014-07-27 NOTE — Progress Notes (Signed)
Inpatient Diabetes Program Recommendations  AACE/ADA: New Consensus Statement on Inpatient Glycemic Control (2013)  Target Ranges:  Prepandial:   less than 140 mg/dL      Peak postprandial:   less than 180 mg/dL (1-2 hours)      Critically ill patients:  140 - 180 mg/dL     Diabetes history: DM 2 Current orders for Inpatient glycemic control: Insulin gtt  Inpatient Diabetes Program Recommendations Insulin - IV drip/GlucoStabilizer: After patient is through with surgery this afternoon please consider placing her on the following: Insulin - Basal: Please consider ordering Lantus 10 units Daily. First dose to give two hours before insulin gtt is discontinued. Correction (SSI): Please consider ordering CBG's ACHS, Novolog 0-9 units TID with meals, Novolog 0-5 units QHS.  Thanks,  Tama Headings RN, MSN, [facility] Inpatient Diabetes Coordinator Team Pager [phone]

## 2014-07-27 NOTE — Progress Notes (Signed)
I reviewed x-rays of the patient which show that she has a displaced fracture dislocation of the ankle.  The emergency room physician and feels confident that he can reduce this to a reasonable situation.  She will need open reduction and internal fixation of the ankle.  Timing for surgery will be based on the level of swelling.  She also has a calcaneus fracture which may need to be fixed as well.  She is going to undergo CT scan once the fracture has been reduced.This is a very complicated problem that has a high likelihood of bad outcome.  The patient has likely need admission to internal medicine or trauma based on additional pathology.  I will see her early this morning and schedule for surgery.a consult note has been written and pended outlining care and will be signed once I have evaluated the patient.
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
## 2014-07-27 NOTE — ED Notes (Signed)
cbg is 190

## 2014-07-27 NOTE — ED Notes (Signed)
Dr. Gardner, hospitalist, at the bedside.  

## 2014-07-27 NOTE — Progress Notes (Signed)
Consult Note:  Pt NPO for possible surgery today. Pt is an add on on the surgery list for 1045 this am. Noted pt placed on insulin gtt. Pt takes Humalin R 10 units with breakfast, 10 units at lunch, 25 units at dinner. Agree with the insulin gtt for now, until after surgery this afternoon. Waiting for more blood sugars to be gathered before we make recommendations later today. A1c is in process. Will watch patient.  Thanks, Tama Headings RN, MSN, [facility] Inpatient Diabetes Coordinator Team Pager [phone]

## 2014-07-27 NOTE — ED Notes (Signed)
Awaiting creatinine result prior to travel to [place]. Spoke with Gerald Stabs in radiology, not resulted yet.

## 2014-07-27 NOTE — Brief Op Note (Signed)
07/26/2014 - 07/27/2014  1:17 PM  PATIENT:  Naaman Plummer Casella  81 y.o. female  PRE-OPERATIVE DIAGNOSIS:  right ankle fracture  POST-OPERATIVE DIAGNOSIS:  right ankle fracture  PROCEDURE:  Procedure(s): OPEN REDUCTION INTERNAL FIXATION (ORIF) ANKLE FRACTURE (Right)  SURGEON:  Surgeon(s) and Role:    * Alta Corning, MD - Primary  PHYSICIAN ASSISTANT:   ASSISTANTS: bethune   ANESTHESIA:   general  EBL:  Total I/O In: 5170 [I.V.:875; IV Piggyback:250] Out: -   BLOOD ADMINISTERED:none  DRAINS: none   LOCAL MEDICATIONS USED:  NONE  SPECIMEN:  No Specimen  DISPOSITION OF SPECIMEN:  N/A  COUNTS:  YES  TOURNIQUET:  * Missing tourniquet times found for documented tourniquets in log:  [id number] *  DICTATION: .Other Dictation: Dictation Number [id number]  PLAN OF CARE: Admit to inpatient   PATIENT DISPOSITION:  PACU - hemodynamically stable.   Delay start of Pharmacological VTE agent (>24hrs) due to surgical blood loss or risk of bleeding: no

## 2014-07-27 NOTE — ED Notes (Signed)
Patient cannot recall her allergy reaction to idodine, reports the last time she had to do a CT scan, it had to be without.
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
## 2014-07-27 NOTE — ED Notes (Signed)
X-ray changed to portable

## 2014-07-28 ENCOUNTER — Encounter (HOSPITAL_COMMUNITY): Payer: Self-pay | Admitting: Orthopedic Surgery

## 2014-07-28 DIAGNOSIS — Z96651 Presence of right artificial knee joint: Secondary | ICD-10-CM | POA: Diagnosis not present

## 2014-07-28 DIAGNOSIS — S2242XA Multiple fractures of ribs, left side, initial encounter for closed fracture: Secondary | ICD-10-CM | POA: Diagnosis not present

## 2014-07-28 DIAGNOSIS — I10 Essential (primary) hypertension: Secondary | ICD-10-CM | POA: Diagnosis not present

## 2014-07-28 DIAGNOSIS — E785 Hyperlipidemia, unspecified: Secondary | ICD-10-CM | POA: Diagnosis not present

## 2014-07-28 DIAGNOSIS — I2510 Atherosclerotic heart disease of native coronary artery without angina pectoris: Secondary | ICD-10-CM | POA: Diagnosis not present

## 2014-07-28 DIAGNOSIS — S92001A Unspecified fracture of right calcaneus, initial encounter for closed fracture: Secondary | ICD-10-CM | POA: Diagnosis not present

## 2014-07-28 DIAGNOSIS — F419 Anxiety disorder, unspecified: Secondary | ICD-10-CM | POA: Diagnosis not present

## 2014-07-28 DIAGNOSIS — E1142 Type 2 diabetes mellitus with diabetic polyneuropathy: Secondary | ICD-10-CM | POA: Diagnosis not present

## 2014-07-28 DIAGNOSIS — S82841A Displaced bimalleolar fracture of right lower leg, initial encounter for closed fracture: Secondary | ICD-10-CM | POA: Diagnosis not present

## 2014-07-28 LAB — CBC WITH DIFFERENTIAL/PLATELET
Basophils Absolute: 0 10*3/uL (ref 0.0–0.1)
Basophils Relative: 0 % (ref 0–1)
Eosinophils Absolute: 0 10*3/uL (ref 0.0–0.7)
Eosinophils Relative: 0 % (ref 0–5)
HEMATOCRIT: 31.9 % — AB (ref 36.0–46.0)
HEMOGLOBIN: 10.4 g/dL — AB (ref 12.0–15.0)
LYMPHS PCT: 11 % — AB (ref 12–46)
Lymphs Abs: 1.1 10*3/uL (ref 0.7–4.0)
MCH: 31 pg (ref 26.0–34.0)
MCHC: 32.6 g/dL (ref 30.0–36.0)
MCV: 94.9 fL (ref 78.0–100.0)
MONOS PCT: 11 % (ref 3–12)
Monocytes Absolute: 1.1 10*3/uL — ABNORMAL HIGH (ref 0.1–1.0)
NEUTROS PCT: 78 % — AB (ref 43–77)
Neutro Abs: 8.1 10*3/uL — ABNORMAL HIGH (ref 1.7–7.7)
PLATELETS: 202 10*3/uL (ref 150–400)
RBC: 3.36 MIL/uL — ABNORMAL LOW (ref 3.87–5.11)
RDW: 14.6 % (ref 11.5–15.5)
WBC: 10.4 10*3/uL (ref 4.0–10.5)

## 2014-07-28 LAB — MAGNESIUM: Magnesium: 1.6 mg/dL (ref 1.5–2.5)

## 2014-07-28 LAB — COMPREHENSIVE METABOLIC PANEL
ALK PHOS: 39 U/L (ref 39–117)
ALT: 15 U/L (ref 0–35)
AST: 27 U/L (ref 0–37)
Albumin: 3 g/dL — ABNORMAL LOW (ref 3.5–5.2)
Anion gap: 7 (ref 5–15)
BUN: 13 mg/dL (ref 6–23)
CALCIUM: 8.3 mg/dL — AB (ref 8.4–10.5)
CO2: 27 mmol/L (ref 19–32)
CREATININE: 0.83 mg/dL (ref 0.50–1.10)
Chloride: 102 mEq/L (ref 96–112)
GFR calc Af Amer: 74 mL/min — ABNORMAL LOW (ref >90)
GFR calc non Af Amer: 64 mL/min — ABNORMAL LOW (ref >90)
GLUCOSE: 185 mg/dL — AB (ref 70–99)
POTASSIUM: 4.3 mmol/L (ref 3.5–5.1)
Sodium: 136 mmol/L (ref 135–145)
TOTAL PROTEIN: 6 g/dL (ref 6.0–8.3)
Total Bilirubin: 0.6 mg/dL (ref 0.3–1.2)

## 2014-07-28 LAB — GLUCOSE, CAPILLARY
GLUCOSE-CAPILLARY: 169 mg/dL — AB (ref 70–99)
GLUCOSE-CAPILLARY: 172 mg/dL — AB (ref 70–99)
GLUCOSE-CAPILLARY: 176 mg/dL — AB (ref 70–99)
GLUCOSE-CAPILLARY: 196 mg/dL — AB (ref 70–99)
GLUCOSE-CAPILLARY: 225 mg/dL — AB (ref 70–99)
Glucose-Capillary: 131 mg/dL — ABNORMAL HIGH (ref 70–99)

## 2014-07-28 LAB — LIPID PANEL
CHOLESTEROL: 158 mg/dL (ref 0–200)
HDL: 48 mg/dL (ref >39)
LDL CALC: 93 mg/dL (ref 0–99)
TRIGLYCERIDES: 85 mg/dL (ref <150)
Total CHOL/HDL Ratio: 3.3 RATIO
VLDL: 17 mg/dL (ref 0–40)

## 2014-07-28 LAB — HEMOGLOBIN A1C
HEMOGLOBIN A1C: 7.2 % — AB (ref <5.7)
MEAN PLASMA GLUCOSE: 160 mg/dL — AB (ref <117)

## 2014-07-28 MED ORDER — INSULIN ASPART PROT & ASPART (70-30 MIX) 100 UNIT/ML ~~LOC~~ SUSP
10.0000 [IU] | Freq: Two times a day (BID) | SUBCUTANEOUS | Status: DC
  Administered 2014-07-28 – 2014-07-31 (×7): 10 [IU] via SUBCUTANEOUS
  Filled 2014-07-28 (×2): qty 10

## 2014-07-28 MED ORDER — INSULIN ASPART 100 UNIT/ML ~~LOC~~ SOLN
0.0000 [IU] | Freq: Three times a day (TID) | SUBCUTANEOUS | Status: DC
  Administered 2014-07-28: 3 [IU] via SUBCUTANEOUS
  Administered 2014-07-29 (×3): 2 [IU] via SUBCUTANEOUS
  Administered 2014-07-30: 3 [IU] via SUBCUTANEOUS
  Administered 2014-07-30: 2 [IU] via SUBCUTANEOUS
  Administered 2014-07-30: 1 [IU] via SUBCUTANEOUS
  Administered 2014-07-31 (×2): 2 [IU] via SUBCUTANEOUS
  Administered 2014-07-31: 1 [IU] via SUBCUTANEOUS

## 2014-07-28 NOTE — Progress Notes (Signed)
Rehab Admissions Coordinator Note:  Patient was screened by Retta Diones for appropriateness for an Inpatient Acute Rehab Consult.  At this time, we are recommending Inpatient Rehab consult.  Retta Diones 07/28/2014, 9:31 AM  I can be reached at [phone].

## 2014-07-28 NOTE — Progress Notes (Signed)
Report called to [employer] [facility] on [address]. Patient currently eating and will transfer as soon as she finishes her meal.
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
## 2014-07-28 NOTE — Anesthesia Postprocedure Evaluation (Signed)
Anesthesia Post Note  Patient: Amber [name]  Procedure(s) Performed: Procedure(s) (LRB): OPEN REDUCTION INTERNAL FIXATION (ORIF) ANKLE FRACTURE (Right)  Anesthesia type: General  Patient location: PACU  Post pain: Pain level controlled  Post assessment: Post-op Vital signs reviewed  Last Vitals: BP 131/57 mmHg  Pulse 64  Temp(Src) 36.8 C (Oral)  Resp 16  Ht 5\' 1"  (1.549 m)  Wt 155 lb (70.308 kg)  BMI 29.30 kg/m2  SpO2 96%  Post vital signs: Reviewed  Level of consciousness: sedated  Complications: No apparent anesthesia complications

## 2014-07-28 NOTE — Progress Notes (Signed)
Inpatient Diabetes Program Recommendations  AACE/ADA: New Consensus Statement on Inpatient Glycemic Control (2013)  Target Ranges:  Prepandial:   less than 140 mg/dL      Peak postprandial:   less than 180 mg/dL (1-2 hours)      Critically ill patients:  140 - 180 mg/dL   Reason for Visit: Results for MEKIA, DIPINTO (MRN [id number]) as of 07/28/2014 10:08  Ref. Range 07/27/2014 20:40 07/28/2014 00:30 07/28/2014 04:24 07/28/2014 07:59  Glucose-Capillary Latest Range: 70-99 mg/dL 166 (H) 196 (H) 172 (H) 131 (H)    Diabetes history: According to history pt., has insulin dependent diabetes  Outpatient Diabetes medications: Regular insulin 10 units in AM, 10 units lunch and 25 units in the evening Current orders for Inpatient glycemic control:  Novolog sensitive q 4 hours  May consider adding Lantus 8 units daily and changing Novolog correction to tid with meals and HS.  Also may consider adding Novolog meal coverage 3 units tid with meals.   Thanks, Adah Perl, RN, BC-ADM Inpatient Diabetes Coordinator Pager [phone]
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
## 2014-07-29 ENCOUNTER — Encounter (HOSPITAL_COMMUNITY): Payer: Self-pay | Admitting: *Deleted

## 2014-07-29 LAB — GLUCOSE, CAPILLARY
GLUCOSE-CAPILLARY: 153 mg/dL — AB (ref 70–99)
GLUCOSE-CAPILLARY: 153 mg/dL — AB (ref 70–99)
Glucose-Capillary: 154 mg/dL — ABNORMAL HIGH (ref 70–99)
Glucose-Capillary: 150 mg/dL — ABNORMAL HIGH (ref 70–99)

## 2014-07-29 LAB — COMPREHENSIVE METABOLIC PANEL
ALT: 8 U/L (ref 0–35)
ANION GAP: 10 (ref 5–15)
AST: 19 U/L (ref 0–37)
Albumin: 2.6 g/dL — ABNORMAL LOW (ref 3.5–5.2)
Alkaline Phosphatase: 42 U/L (ref 39–117)
BUN: 13 mg/dL (ref 6–23)
CO2: 22 mmol/L (ref 19–32)
CREATININE: 0.94 mg/dL (ref 0.50–1.10)
Calcium: 8.3 mg/dL — ABNORMAL LOW (ref 8.4–10.5)
Chloride: 104 mmol/L (ref 96–112)
GFR, EST AFRICAN AMERICAN: 64 mL/min — AB (ref >90)
GFR, EST NON AFRICAN AMERICAN: 55 mL/min — AB (ref >90)
GLUCOSE: 153 mg/dL — AB (ref 70–99)
Potassium: 4.4 mmol/L (ref 3.5–5.1)
SODIUM: 136 mmol/L (ref 135–145)
Total Bilirubin: 0.7 mg/dL (ref 0.3–1.2)
Total Protein: 5.3 g/dL — ABNORMAL LOW (ref 6.0–8.3)

## 2014-07-29 LAB — CBC
HCT: 27.8 % — ABNORMAL LOW (ref 36.0–46.0)
HEMOGLOBIN: 9.1 g/dL — AB (ref 12.0–15.0)
MCH: 30.4 pg (ref 26.0–34.0)
MCHC: 32.7 g/dL (ref 30.0–36.0)
MCV: 93 fL (ref 78.0–100.0)
PLATELETS: 189 10*3/uL (ref 150–400)
RBC: 2.99 MIL/uL — ABNORMAL LOW (ref 3.87–5.11)
RDW: 14.5 % (ref 11.5–15.5)
WBC: 9.9 10*3/uL (ref 4.0–10.5)

## 2014-07-29 MED ORDER — HEPARIN SODIUM (PORCINE) 5000 UNIT/ML IJ SOLN
5000.0000 [IU] | Freq: Three times a day (TID) | INTRAMUSCULAR | Status: DC
  Administered 2014-07-29 – 2014-07-31 (×7): 5000 [IU] via SUBCUTANEOUS
  Filled 2014-07-29 (×7): qty 1

## 2014-07-29 MED ORDER — CEPHALEXIN 500 MG PO CAPS: 500.0000 mg | ORAL_CAPSULE | Freq: Three times a day (TID) | ORAL | Status: DC

## 2014-07-29 MED ORDER — CEPHALEXIN 500 MG PO CAPS
500.0000 mg | ORAL_CAPSULE | Freq: Three times a day (TID) | ORAL | Status: DC
  Administered 2014-07-29 – 2014-07-31 (×7): 500 mg via ORAL
  Filled 2014-07-29 (×7): qty 1

## 2014-07-29 MED ORDER — ASPIRIN EC 81 MG PO TBEC
81.0000 mg | DELAYED_RELEASE_TABLET | Freq: Every day | ORAL | Status: DC
Start: 2014-07-30 — End: 2014-07-31
  Administered 2014-07-30 – 2014-07-31 (×2): 81 mg via ORAL
  Filled 2014-07-29 (×2): qty 1
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
## 2014-07-29 NOTE — Progress Notes (Signed)
   PATIENT ID: Amber [name]   2 Days Post-Op Procedure(s) (LRB): OPEN REDUCTION INTERNAL FIXATION (ORIF) ANKLE FRACTURE (Right)  Subjective: Doing well, lying in bed. Tells me she is comfortable. Leg elevated on pillows. Wants to get up with PT today.   Objective:  Filed Vitals:   07/29/14 0623  BP: 155/57  Pulse: 71  Temp: 99 F (37.2 C)  Resp: 18     R splint c/d/i Wiggles toes, distally NVI  Labs:   Recent Labs  07/26/14 2340 07/27/14 0820 07/28/14 0254 07/29/14 0431  HGB 13.6 11.4* 10.4* 9.1*   Recent Labs  07/28/14 0254 07/29/14 0431  WBC 10.4 9.9  RBC 3.36* 2.99*  HCT 31.9* 27.8*  PLT 202 189   Recent Labs  07/28/14 0254 07/29/14 0431  NA 136 136  K 4.3 4.4  CL 102 104  CO2 27 22  BUN 13 13  CREATININE 0.83 0.94  GLUCOSE 185* 153*  CALCIUM 8.3* 8.3*    Assessment and Plan: 2 days s/p orif right ankle/calcaneous fracture nonweight bearing R LE Up with PT today Continue splint Will start po Kelfex 500mg  TID for 10 days today, script in chart to continue after d/c to minimize risk of infection in patient with high risk due to comorbidities.  D/c per primary team, possibly CIR  VTE proph: per primary team

## 2014-07-30 LAB — GLUCOSE, CAPILLARY
GLUCOSE-CAPILLARY: 144 mg/dL — AB (ref 70–99)
GLUCOSE-CAPILLARY: 166 mg/dL — AB (ref 70–99)
Glucose-Capillary: 206 mg/dL — ABNORMAL HIGH (ref 70–99)
Glucose-Capillary: 141 mg/dL — ABNORMAL HIGH (ref 70–99)

## 2014-07-30 MED ORDER — BACID PO TABS
2.0000 | ORAL_TABLET | Freq: Three times a day (TID) | ORAL | Status: DC
  Filled 2014-07-30 (×3): qty 2

## 2014-07-30 MED ORDER — GLIPIZIDE 5 MG PO TABS
2.5000 mg | ORAL_TABLET | Freq: Every day | ORAL | Status: DC
  Administered 2014-07-31: 2.5 mg via ORAL
  Filled 2014-07-30: qty 1

## 2014-07-30 MED ORDER — RISAQUAD PO CAPS
2.0000 | ORAL_CAPSULE | Freq: Three times a day (TID) | ORAL | Status: DC
  Administered 2014-07-30 – 2014-07-31 (×5): 2 via ORAL
  Filled 2014-07-30 (×6): qty 2

## 2014-07-30 MED ORDER — BACID PO TABS
2.0000 | ORAL_TABLET | Freq: Three times a day (TID) | ORAL | Status: DC
Start: 2014-07-30 — End: 2014-07-30
  Filled 2014-07-30 (×3): qty 2
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
## 2014-07-30 NOTE — Progress Notes (Signed)
   PATIENT ID: Amber [name]   3 Days Post-Op Procedure(s) (LRB): OPEN REDUCTION INTERNAL FIXATION (ORIF) ANKLE FRACTURE (Right)  Subjective: Feeling well today. Minimal pain.   Objective:  Filed Vitals:   07/30/14 0638  BP: 170/65  Pulse: 66  Temp: 98.9 F (37.2 C)  Resp: 18     R splint c/d/i Wiggles toes, distally NVI  Labs:   Recent Labs  07/28/14 0254 07/29/14 0431  HGB 10.4* 9.1*   Recent Labs  07/28/14 0254 07/29/14 0431  WBC 10.4 9.9  RBC 3.36* 2.99*  HCT 31.9* 27.8*  PLT 202 189   Recent Labs  07/28/14 0254 07/29/14 0431  NA 136 136  K 4.3 4.4  CL 102 104  CO2 27 22  BUN 13 13  CREATININE 0.83 0.94  GLUCOSE 185* 153*  CALCIUM 8.3* 8.3*    Assessment and Plan: 3 days s/p orif right ankle/calcaneous fracture nonweight bearing R LE Up with PT today Continue splint Continue Kelfex 500mg  TID for 10 days today, script in chart to continue after d/c to minimize risk of infection in patient with high risk due to comorbidities.  D/c per primary team, PT rec SNF vs CIR  VTE proph: per primary team

## 2014-07-31 DIAGNOSIS — G894 Chronic pain syndrome: Secondary | ICD-10-CM | POA: Diagnosis not present

## 2014-07-31 DIAGNOSIS — S2249XS Multiple fractures of ribs, unspecified side, sequela: Secondary | ICD-10-CM

## 2014-07-31 DIAGNOSIS — S2249XD Multiple fractures of ribs, unspecified side, subsequent encounter for fracture with routine healing: Secondary | ICD-10-CM | POA: Diagnosis not present

## 2014-07-31 DIAGNOSIS — M6281 Muscle weakness (generalized): Secondary | ICD-10-CM | POA: Diagnosis not present

## 2014-07-31 DIAGNOSIS — S82891A Other fracture of right lower leg, initial encounter for closed fracture: Secondary | ICD-10-CM | POA: Diagnosis not present

## 2014-07-31 DIAGNOSIS — S82891S Other fracture of right lower leg, sequela: Secondary | ICD-10-CM | POA: Diagnosis not present

## 2014-07-31 DIAGNOSIS — S2241XA Multiple fractures of ribs, right side, initial encounter for closed fracture: Secondary | ICD-10-CM | POA: Diagnosis not present

## 2014-07-31 DIAGNOSIS — S82899D Other fracture of unspecified lower leg, subsequent encounter for closed fracture with routine healing: Secondary | ICD-10-CM | POA: Diagnosis not present

## 2014-07-31 DIAGNOSIS — I4891 Unspecified atrial fibrillation: Secondary | ICD-10-CM | POA: Diagnosis not present

## 2014-07-31 DIAGNOSIS — S2242XA Multiple fractures of ribs, left side, initial encounter for closed fracture: Secondary | ICD-10-CM | POA: Diagnosis not present

## 2014-07-31 DIAGNOSIS — E1165 Type 2 diabetes mellitus with hyperglycemia: Secondary | ICD-10-CM | POA: Diagnosis not present

## 2014-07-31 DIAGNOSIS — Z4789 Encounter for other orthopedic aftercare: Secondary | ICD-10-CM | POA: Diagnosis not present

## 2014-07-31 DIAGNOSIS — I10 Essential (primary) hypertension: Secondary | ICD-10-CM | POA: Diagnosis not present

## 2014-07-31 DIAGNOSIS — M21271 Flexion deformity, right ankle and toes: Secondary | ICD-10-CM | POA: Diagnosis not present

## 2014-07-31 DIAGNOSIS — E1140 Type 2 diabetes mellitus with diabetic neuropathy, unspecified: Secondary | ICD-10-CM | POA: Diagnosis not present

## 2014-07-31 DIAGNOSIS — D649 Anemia, unspecified: Secondary | ICD-10-CM | POA: Diagnosis not present

## 2014-07-31 DIAGNOSIS — E119 Type 2 diabetes mellitus without complications: Secondary | ICD-10-CM | POA: Diagnosis not present

## 2014-07-31 DIAGNOSIS — Z9889 Other specified postprocedural states: Secondary | ICD-10-CM | POA: Diagnosis not present

## 2014-07-31 DIAGNOSIS — R262 Difficulty in walking, not elsewhere classified: Secondary | ICD-10-CM | POA: Diagnosis not present

## 2014-07-31 DIAGNOSIS — S83241A Other tear of medial meniscus, current injury, right knee, initial encounter: Secondary | ICD-10-CM | POA: Diagnosis not present

## 2014-07-31 DIAGNOSIS — E782 Mixed hyperlipidemia: Secondary | ICD-10-CM | POA: Diagnosis not present

## 2014-07-31 LAB — CBC
HEMATOCRIT: 27.2 % — AB (ref 36.0–46.0)
Hemoglobin: 9 g/dL — ABNORMAL LOW (ref 12.0–15.0)
MCH: 30.2 pg (ref 26.0–34.0)
MCHC: 33.1 g/dL (ref 30.0–36.0)
MCV: 91.3 fL (ref 78.0–100.0)
Platelets: 235 10*3/uL (ref 150–400)
RBC: 2.98 MIL/uL — ABNORMAL LOW (ref 3.87–5.11)
RDW: 14 % (ref 11.5–15.5)
WBC: 6.6 10*3/uL (ref 4.0–10.5)

## 2014-07-31 LAB — GLUCOSE, CAPILLARY
GLUCOSE-CAPILLARY: 138 mg/dL — AB (ref 70–99)
Glucose-Capillary: 178 mg/dL — ABNORMAL HIGH (ref 70–99)
Glucose-Capillary: 172 mg/dL — ABNORMAL HIGH (ref 70–99)

## 2014-07-31 LAB — BASIC METABOLIC PANEL
Anion gap: 4 — ABNORMAL LOW (ref 5–15)
BUN: 18 mg/dL (ref 6–23)
CALCIUM: 8.5 mg/dL (ref 8.4–10.5)
CHLORIDE: 104 mmol/L (ref 96–112)
CO2: 29 mmol/L (ref 19–32)
CREATININE: 0.79 mg/dL (ref 0.50–1.10)
GFR calc Af Amer: 87 mL/min — ABNORMAL LOW (ref >90)
GFR calc non Af Amer: 75 mL/min — ABNORMAL LOW (ref >90)
Glucose, Bld: 116 mg/dL — ABNORMAL HIGH (ref 70–99)
POTASSIUM: 3.7 mmol/L (ref 3.5–5.1)
SODIUM: 137 mmol/L (ref 135–145)

## 2014-07-31 MED ORDER — OXYCODONE-ACETAMINOPHEN 5-325 MG PO TABS: 1.0000 | ORAL_TABLET | Freq: Four times a day (QID) | ORAL | Status: DC | PRN

## 2014-07-31 MED ORDER — ALPRAZOLAM 1 MG PO TABS: 1.0000 mg | ORAL_TABLET | Freq: Every evening | ORAL | Status: DC | PRN

## 2014-07-31 MED ORDER — SENNOSIDES-DOCUSATE SODIUM 8.6-50 MG PO TABS: 2.0000 | ORAL_TABLET | Freq: Two times a day (BID) | ORAL | Status: DC

## 2014-07-31 MED ORDER — ENSURE COMPLETE PO LIQD: 237.0000 mL | Freq: Two times a day (BID) | ORAL | Status: DC

## 2014-07-31 MED ORDER — CEPHALEXIN 500 MG PO CAPS: 500.0000 mg | ORAL_CAPSULE | Freq: Three times a day (TID) | ORAL | Status: AC

## 2014-07-31 MED ORDER — INSULIN ASPART PROT & ASPART (70-30 MIX) 100 UNIT/ML ~~LOC~~ SUSP: 10.0000 [IU] | Freq: Two times a day (BID) | SUBCUTANEOUS | Status: DC

## 2014-07-31 MED ORDER — SENNOSIDES-DOCUSATE SODIUM 8.6-50 MG PO TABS
2.0000 | ORAL_TABLET | Freq: Two times a day (BID) | ORAL | Status: DC
  Administered 2014-07-31: 2 via ORAL
  Filled 2014-07-31: qty 1

## 2014-07-31 MED ORDER — DOCUSATE SODIUM 100 MG PO CAPS: 100.0000 mg | ORAL_CAPSULE | Freq: Two times a day (BID) | ORAL | Status: DC

## 2014-07-31 MED ORDER — INSULIN ASPART 100 UNIT/ML ~~LOC~~ SOLN: 0.0000 [IU] | Freq: Three times a day (TID) | SUBCUTANEOUS | Status: DC

## 2014-07-31 MED ORDER — POLYETHYLENE GLYCOL 3350 17 G PO PACK
17.0000 g | PACK | Freq: Two times a day (BID) | ORAL | Status: DC
  Administered 2014-07-31: 17 g via ORAL
  Filled 2014-07-31: qty 1

## 2014-07-31 MED ORDER — RISAQUAD PO CAPS: 2.0000 | ORAL_CAPSULE | Freq: Three times a day (TID) | ORAL | Status: AC

## 2014-07-31 MED ORDER — BISACODYL 10 MG RE SUPP
10.0000 mg | Freq: Once | RECTAL | Status: DC
  Filled 2014-07-31: qty 1

## 2014-07-31 MED ORDER — BISACODYL 10 MG RE SUPP: 10.0000 mg | Freq: Every day | RECTAL | Status: DC | PRN

## 2014-07-31 MED ORDER — POLYETHYLENE GLYCOL 3350 17 G PO PACK: 17.0000 g | PACK | Freq: Two times a day (BID) | ORAL | Status: DC

## 2014-07-31 NOTE — Clinical Social Work Note (Signed)
Clinical Social Worker facilitated patient discharge including contacting patient family and facility to confirm patient discharge plans.  Clinical information faxed to facility and family agreeable with plan.  CSW arranged ambulance transport via [facility] to [place].  RN to call report prior to discharge. [employer] Authorization received from [employer].   DC packet prepared for [facility] transport.   Clinical Social Worker will sign off for now as social work intervention is no longer needed. Please consult [place] again if new need arises.  Glendon Axe, MSW, LCSWA [phone] 07/31/2014 2:59 PM
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
## 2014-07-31 NOTE — Clinical Social Work Note (Signed)
Clinical Social Worker has assessed patient and pt's family. Full psychosocial to follow.   Glendon Axe, MSW, LCSWA [phone] 07/31/2014 11:57 AM

## 2014-07-31 NOTE — Progress Notes (Addendum)
Subjective: 4 Days Post-Op Procedure(s) (LRB): OPEN REDUCTION INTERNAL FIXATION (ORIF) ANKLE FRACTURE (Right) Patient reports pain as mild.    Objective: Vital signs in last 24 hours: Temp:  [98.3 F (36.8 C)-98.9 F (37.2 C)] 98.3 F (36.8 C) (01/25 0625) Pulse Rate:  [58-71] 58 (01/25 0625) Resp:  [16-18] 16 (01/25 0625) BP: (127-152)/(51-80) 133/55 mmHg (01/25 0625) SpO2:  [93 %-95 %] 94 % (01/25 0625)  Intake/Output from previous day:   Intake/Output this shift:     Recent Labs  07/29/14 0431 07/31/14 0427  HGB 9.1* 9.0*    Recent Labs  07/29/14 0431 07/31/14 0427  WBC 9.9 6.6  RBC 2.99* 2.98*  HCT 27.8* 27.2*  PLT 189 235    Recent Labs  07/29/14 0431 07/31/14 0427  NA 136 137  K 4.4 3.7  CL 104 104  CO2 22 29  BUN 13 18  CREATININE 0.94 0.79  GLUCOSE 153* 116*  CALCIUM 8.3* 8.5   No results for input(s): LABPT, INR in the last 72 hours.  Neurologically intact ABD soft Neurovascular intact Sensation intact distally No cellulitis present Compartment soft  Assessment/Plan: 4 Days Post-Op Procedure(s) (LRB): OPEN REDUCTION INTERNAL FIXATION (ORIF) ANKLE FRACTURE (Right) Advance diet Up with therapy Discharge [facility].  Hopefully PT can help with making this decision today.  I will see her back in the office in 2 weeks for repeat evaluation. The patient needs to be nonweightbearing at all times on the right lower extremity.  Amber [name] L 07/31/2014, 8:29 AM

## 2014-07-31 NOTE — Progress Notes (Addendum)
Report given to Beulah at [facility] [phone]). All belongings sent with family. Patient is waiting for transportation from [place].  Ave Filter, RN
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
## 2014-07-31 NOTE — Discharge Instructions (Signed)
Follow with Primary MD SPARKS,JEFFREY D, MD in 7 days  Continue to use incentive spirometry once every hour Get CBC, CMP, 2 view Chest X ray checked  by Primary MD next visit.    Activity: The patient needs to be nonweightbearing at all times on the right lower extremity.  Disposition skilled nursing facility   Diet: Heart Healthy  , with feeding assistance and aspiration precautions as needed.  For Heart failure patients - Check your Weight same time everyday, if you gain over 2 pounds, or you develop in leg swelling, experience more shortness of breath or chest pain, call your Primary MD immediately. Follow Cardiac Low Salt Diet and 1.8 lit/day fluid restriction.   On your next visit with your primary care physician please Get Medicines reviewed and adjusted.   Please request your Prim.MD to go over all Hospital Tests and Procedure/Radiological results at the follow up, please get all Hospital records sent to your Prim MD by signing hospital release before you go home.   If you experience worsening of your admission symptoms, develop shortness of breath, life threatening emergency, suicidal or homicidal thoughts you must seek medical attention immediately by calling 911 or calling your MD immediately  if symptoms less severe.  You Must read complete instructions/literature along with all the possible adverse reactions/side effects for all the Medicines you take and that have been prescribed to you. Take any new Medicines after you have completely understood and accpet all the possible adverse reactions/side effects.   Do not drive, operating heavy machinery, perform activities at heights, swimming or participation in water activities or provide baby sitting services if your were admitted for syncope or siezures until you have seen by Primary MD or a Neurologist and advised to do so again.  Do not drive when taking Pain medications.    Do not take more than prescribed Pain, Sleep and  Anxiety Medications  Special Instructions: If you have smoked or chewed Tobacco  in the last 2 yrs please stop smoking, stop any regular Alcohol  and or any Recreational drug use.  Wear Seat belts while driving.   Please note  You were cared for by a hospitalist during your hospital stay. If you have any questions about your discharge medications or the care you received while you were in the hospital after you are discharged, you can call the unit and asked to speak with the hospitalist on call if the hospitalist that took care of you is not available. Once you are discharged, your primary care physician will handle any further medical issues. Please note that NO REFILLS for any discharge medications will be authorized once you are discharged, as it is imperative that you return to your primary care physician (or establish a relationship with a primary care physician if you do not have one) for your aftercare needs so that they can reassess your need for medications and monitor your lab values.
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
## 2014-07-31 NOTE — Discharge Summary (Signed)
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 in the evening.     losartan 100 MG tablet  Commonly known as:  COZAAR  Take 100 mg by mouth daily.     omeprazole 20 MG capsule  Commonly known as:  PRILOSEC  Take 20 mg by mouth daily.     oxyCODONE-acetaminophen 5-325 MG per tablet  Commonly known as:  PERCOCET/ROXICET  Take 1 tablet by mouth every 6 (six) hours as needed for moderate pain.     polyethylene glycol packet  Commonly known as:  MIRALAX / GLYCOLAX  Take 17 g by mouth 2 (two) times daily.     pravastatin 40 MG tablet  Commonly known as:  PRAVACHOL  Take 40 mg by mouth at bedtime.     senna-docusate 8.6-50 MG per tablet  Commonly known as:  Senokot-S  Take 2 tablets by mouth 2 (two) times daily.         Total Time in preparing paper work, data evaluation and todays exam - 35 minutes  ELGERGAWY, DAWOOD M.D on 07/31/2014 at [facility]  [phone]

## 2014-07-31 NOTE — Progress Notes (Signed)
Called [facility] to confirmed transportation.Marland Kitchen Spoke with Shanon Brow and he verbalized that EMS is "pretty slammed" and will arrive shortly to transport patient to facility.  Oluwaseun Bruyere, RN.

## 2014-07-31 NOTE — Progress Notes (Signed)
I met again with pt, her daughter in law and son at bedside. Pt prefers SNF rehab due to cost of inpt rehab. I have alerted MD as well as SW. We will sign off [phone]

## 2014-07-31 NOTE — Progress Notes (Signed)
I met with pt and her daughter in law at bedside. We discussed an inpt rehab stay vs SNF rehab. I will check coverage and discuss cosy difference with pt today for her to decide which venue she would like to pursue with [facility]. Need OT eval that I have ordered. [phone]

## 2014-08-01 DIAGNOSIS — I10 Essential (primary) hypertension: Secondary | ICD-10-CM | POA: Diagnosis not present

## 2014-08-01 DIAGNOSIS — Z4789 Encounter for other orthopedic aftercare: Secondary | ICD-10-CM | POA: Diagnosis not present

## 2014-08-01 DIAGNOSIS — E782 Mixed hyperlipidemia: Secondary | ICD-10-CM | POA: Diagnosis not present

## 2014-08-01 DIAGNOSIS — E119 Type 2 diabetes mellitus without complications: Secondary | ICD-10-CM | POA: Diagnosis not present

## 2014-08-01 NOTE — Clinical Social Work Placement (Signed)
Clinical Social Work Department CLINICAL SOCIAL WORK PLACEMENT NOTE 08/01/2014  Patient:  Amber [name], Amber [name]  Account Number:  [id number] [place] date:  07/26/2014  Clinical Social Worker:  Glendon Axe, CLINICAL SOCIAL WORKER  Date/time:  07/31/2014 1:37 PM  Clinical Social Work is seeking post-discharge placement for this patient at the following level of care:   SKILLED NURSING   (*CSW will update this form in Epic as items are completed)   07/31/2014  Patient/family provided with [place] Department of Clinical Social Work's list of facilities offering this level of care within the geographic area requested by the patient (or if unable, by the patient's family).  07/31/2014  Patient/family informed of their freedom to choose among providers that offer the needed level of care, that participate in Medicare, Medicaid or managed care program needed by the patient, have an available bed and are willing to accept the patient.  07/31/2014  Patient/family informed of [facility]' ownership interest in [facility], as well as of the fact that they are under no obligation to receive care at this facility.  PASARR submitted to EDS on 07/31/2014 PASARR number received on 07/31/2014  FL2 transmitted to all facilities in geographic area requested by pt/family on  07/31/2014 FL2 transmitted to all facilities within larger geographic area on 07/31/2014  Patient informed that his/her managed care company has contracts with or will negotiate with  certain facilities, including the following:   YES     Patient/family informed of bed offers received:  07/31/2014 Patient chooses bed at [place]  Physician recommends and patient chooses bed at    Patient to be transferred to  on  07/31/2014 Patient to be transferred to facility by [facility] Patient and family notified of transfer on 07/31/2014 Name of family member notified:  Pt's son and dtr-in-law present at  bedside.  The following physician request were entered in Epic:   Additional Comments:
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
## 2014-08-07 DIAGNOSIS — G894 Chronic pain syndrome: Secondary | ICD-10-CM | POA: Diagnosis not present

## 2014-08-07 DIAGNOSIS — I10 Essential (primary) hypertension: Secondary | ICD-10-CM | POA: Diagnosis not present

## 2014-08-07 DIAGNOSIS — D649 Anemia, unspecified: Secondary | ICD-10-CM | POA: Diagnosis not present

## 2014-08-08 DIAGNOSIS — E1165 Type 2 diabetes mellitus with hyperglycemia: Secondary | ICD-10-CM | POA: Diagnosis not present

## 2014-08-10 DIAGNOSIS — S83241A Other tear of medial meniscus, current injury, right knee, initial encounter: Secondary | ICD-10-CM | POA: Diagnosis not present

## 2014-08-13 DIAGNOSIS — Z794 Long term (current) use of insulin: Secondary | ICD-10-CM | POA: Diagnosis not present

## 2014-08-13 DIAGNOSIS — E119 Type 2 diabetes mellitus without complications: Secondary | ICD-10-CM | POA: Diagnosis not present

## 2014-08-13 DIAGNOSIS — I2510 Atherosclerotic heart disease of native coronary artery without angina pectoris: Secondary | ICD-10-CM | POA: Diagnosis not present

## 2014-08-13 DIAGNOSIS — S82841D Displaced bimalleolar fracture of right lower leg, subsequent encounter for closed fracture with routine healing: Secondary | ICD-10-CM | POA: Diagnosis not present

## 2014-08-13 DIAGNOSIS — G894 Chronic pain syndrome: Secondary | ICD-10-CM | POA: Diagnosis not present

## 2014-08-13 DIAGNOSIS — F419 Anxiety disorder, unspecified: Secondary | ICD-10-CM | POA: Diagnosis not present

## 2014-08-13 DIAGNOSIS — D649 Anemia, unspecified: Secondary | ICD-10-CM | POA: Diagnosis not present

## 2014-08-13 DIAGNOSIS — S2249XD Multiple fractures of ribs, unspecified side, subsequent encounter for fracture with routine healing: Secondary | ICD-10-CM | POA: Diagnosis not present

## 2014-08-15 DIAGNOSIS — I2510 Atherosclerotic heart disease of native coronary artery without angina pectoris: Secondary | ICD-10-CM | POA: Diagnosis not present

## 2014-08-15 DIAGNOSIS — E119 Type 2 diabetes mellitus without complications: Secondary | ICD-10-CM | POA: Diagnosis not present

## 2014-08-15 DIAGNOSIS — F419 Anxiety disorder, unspecified: Secondary | ICD-10-CM | POA: Diagnosis not present

## 2014-08-15 DIAGNOSIS — S82841D Displaced bimalleolar fracture of right lower leg, subsequent encounter for closed fracture with routine healing: Secondary | ICD-10-CM | POA: Diagnosis not present

## 2014-08-15 DIAGNOSIS — Z794 Long term (current) use of insulin: Secondary | ICD-10-CM | POA: Diagnosis not present

## 2014-08-15 DIAGNOSIS — D649 Anemia, unspecified: Secondary | ICD-10-CM | POA: Diagnosis not present

## 2014-08-15 DIAGNOSIS — G894 Chronic pain syndrome: Secondary | ICD-10-CM | POA: Diagnosis not present

## 2014-08-15 DIAGNOSIS — S2249XD Multiple fractures of ribs, unspecified side, subsequent encounter for fracture with routine healing: Secondary | ICD-10-CM | POA: Diagnosis not present

## 2014-08-16 DIAGNOSIS — F419 Anxiety disorder, unspecified: Secondary | ICD-10-CM | POA: Diagnosis not present

## 2014-08-16 DIAGNOSIS — G894 Chronic pain syndrome: Secondary | ICD-10-CM | POA: Diagnosis not present

## 2014-08-16 DIAGNOSIS — I2510 Atherosclerotic heart disease of native coronary artery without angina pectoris: Secondary | ICD-10-CM | POA: Diagnosis not present

## 2014-08-16 DIAGNOSIS — Z794 Long term (current) use of insulin: Secondary | ICD-10-CM | POA: Diagnosis not present

## 2014-08-16 DIAGNOSIS — S82841D Displaced bimalleolar fracture of right lower leg, subsequent encounter for closed fracture with routine healing: Secondary | ICD-10-CM | POA: Diagnosis not present

## 2014-08-16 DIAGNOSIS — S2249XD Multiple fractures of ribs, unspecified side, subsequent encounter for fracture with routine healing: Secondary | ICD-10-CM | POA: Diagnosis not present

## 2014-08-16 DIAGNOSIS — E119 Type 2 diabetes mellitus without complications: Secondary | ICD-10-CM | POA: Diagnosis not present

## 2014-08-16 DIAGNOSIS — D649 Anemia, unspecified: Secondary | ICD-10-CM | POA: Diagnosis not present

## 2014-08-19 DIAGNOSIS — Z9889 Other specified postprocedural states: Secondary | ICD-10-CM | POA: Diagnosis not present

## 2014-08-21 DIAGNOSIS — S2249XD Multiple fractures of ribs, unspecified side, subsequent encounter for fracture with routine healing: Secondary | ICD-10-CM | POA: Diagnosis not present

## 2014-08-21 DIAGNOSIS — Z794 Long term (current) use of insulin: Secondary | ICD-10-CM | POA: Diagnosis not present

## 2014-08-21 DIAGNOSIS — S82841D Displaced bimalleolar fracture of right lower leg, subsequent encounter for closed fracture with routine healing: Secondary | ICD-10-CM | POA: Diagnosis not present

## 2014-08-21 DIAGNOSIS — D649 Anemia, unspecified: Secondary | ICD-10-CM | POA: Diagnosis not present

## 2014-08-21 DIAGNOSIS — G894 Chronic pain syndrome: Secondary | ICD-10-CM | POA: Diagnosis not present

## 2014-08-21 DIAGNOSIS — I2510 Atherosclerotic heart disease of native coronary artery without angina pectoris: Secondary | ICD-10-CM | POA: Diagnosis not present

## 2014-08-21 DIAGNOSIS — F419 Anxiety disorder, unspecified: Secondary | ICD-10-CM | POA: Diagnosis not present

## 2014-08-21 DIAGNOSIS — E119 Type 2 diabetes mellitus without complications: Secondary | ICD-10-CM | POA: Diagnosis not present

## 2014-08-22 DIAGNOSIS — E782 Mixed hyperlipidemia: Secondary | ICD-10-CM | POA: Diagnosis not present

## 2014-08-22 DIAGNOSIS — G894 Chronic pain syndrome: Secondary | ICD-10-CM | POA: Diagnosis not present

## 2014-08-22 DIAGNOSIS — D649 Anemia, unspecified: Secondary | ICD-10-CM | POA: Diagnosis not present

## 2014-08-22 DIAGNOSIS — I2510 Atherosclerotic heart disease of native coronary artery without angina pectoris: Secondary | ICD-10-CM | POA: Diagnosis not present

## 2014-08-23 DIAGNOSIS — I2510 Atherosclerotic heart disease of native coronary artery without angina pectoris: Secondary | ICD-10-CM | POA: Diagnosis not present

## 2014-08-23 DIAGNOSIS — E119 Type 2 diabetes mellitus without complications: Secondary | ICD-10-CM | POA: Diagnosis not present

## 2014-08-23 DIAGNOSIS — Z794 Long term (current) use of insulin: Secondary | ICD-10-CM | POA: Diagnosis not present

## 2014-08-23 DIAGNOSIS — S82841D Displaced bimalleolar fracture of right lower leg, subsequent encounter for closed fracture with routine healing: Secondary | ICD-10-CM | POA: Diagnosis not present

## 2014-08-23 DIAGNOSIS — D649 Anemia, unspecified: Secondary | ICD-10-CM | POA: Diagnosis not present

## 2014-08-23 DIAGNOSIS — F419 Anxiety disorder, unspecified: Secondary | ICD-10-CM | POA: Diagnosis not present

## 2014-08-23 DIAGNOSIS — S2249XD Multiple fractures of ribs, unspecified side, subsequent encounter for fracture with routine healing: Secondary | ICD-10-CM | POA: Diagnosis not present

## 2014-08-23 DIAGNOSIS — G894 Chronic pain syndrome: Secondary | ICD-10-CM | POA: Diagnosis not present

## 2014-08-24 DIAGNOSIS — E119 Type 2 diabetes mellitus without complications: Secondary | ICD-10-CM | POA: Diagnosis not present

## 2014-08-24 DIAGNOSIS — Z794 Long term (current) use of insulin: Secondary | ICD-10-CM | POA: Diagnosis not present

## 2014-08-24 DIAGNOSIS — I2510 Atherosclerotic heart disease of native coronary artery without angina pectoris: Secondary | ICD-10-CM | POA: Diagnosis not present

## 2014-08-24 DIAGNOSIS — D649 Anemia, unspecified: Secondary | ICD-10-CM | POA: Diagnosis not present

## 2014-08-24 DIAGNOSIS — S82841D Displaced bimalleolar fracture of right lower leg, subsequent encounter for closed fracture with routine healing: Secondary | ICD-10-CM | POA: Diagnosis not present

## 2014-08-24 DIAGNOSIS — G894 Chronic pain syndrome: Secondary | ICD-10-CM | POA: Diagnosis not present

## 2014-08-24 DIAGNOSIS — S2249XD Multiple fractures of ribs, unspecified side, subsequent encounter for fracture with routine healing: Secondary | ICD-10-CM | POA: Diagnosis not present

## 2014-08-24 DIAGNOSIS — F419 Anxiety disorder, unspecified: Secondary | ICD-10-CM | POA: Diagnosis not present

## 2014-08-29 DIAGNOSIS — Z794 Long term (current) use of insulin: Secondary | ICD-10-CM | POA: Diagnosis not present

## 2014-08-29 DIAGNOSIS — D649 Anemia, unspecified: Secondary | ICD-10-CM | POA: Diagnosis not present

## 2014-08-29 DIAGNOSIS — S82841D Displaced bimalleolar fracture of right lower leg, subsequent encounter for closed fracture with routine healing: Secondary | ICD-10-CM | POA: Diagnosis not present

## 2014-08-29 DIAGNOSIS — S2249XD Multiple fractures of ribs, unspecified side, subsequent encounter for fracture with routine healing: Secondary | ICD-10-CM | POA: Diagnosis not present

## 2014-08-29 DIAGNOSIS — F419 Anxiety disorder, unspecified: Secondary | ICD-10-CM | POA: Diagnosis not present

## 2014-08-29 DIAGNOSIS — G894 Chronic pain syndrome: Secondary | ICD-10-CM | POA: Diagnosis not present

## 2014-08-29 DIAGNOSIS — E119 Type 2 diabetes mellitus without complications: Secondary | ICD-10-CM | POA: Diagnosis not present

## 2014-08-29 DIAGNOSIS — I2510 Atherosclerotic heart disease of native coronary artery without angina pectoris: Secondary | ICD-10-CM | POA: Diagnosis not present

## 2014-08-30 DIAGNOSIS — S2249XD Multiple fractures of ribs, unspecified side, subsequent encounter for fracture with routine healing: Secondary | ICD-10-CM | POA: Diagnosis not present

## 2014-08-30 DIAGNOSIS — E119 Type 2 diabetes mellitus without complications: Secondary | ICD-10-CM | POA: Diagnosis not present

## 2014-08-30 DIAGNOSIS — S82841D Displaced bimalleolar fracture of right lower leg, subsequent encounter for closed fracture with routine healing: Secondary | ICD-10-CM | POA: Diagnosis not present

## 2014-08-30 DIAGNOSIS — I2510 Atherosclerotic heart disease of native coronary artery without angina pectoris: Secondary | ICD-10-CM | POA: Diagnosis not present

## 2014-09-05 DIAGNOSIS — D649 Anemia, unspecified: Secondary | ICD-10-CM | POA: Diagnosis not present

## 2014-09-05 DIAGNOSIS — E119 Type 2 diabetes mellitus without complications: Secondary | ICD-10-CM | POA: Diagnosis not present

## 2014-09-05 DIAGNOSIS — S2249XD Multiple fractures of ribs, unspecified side, subsequent encounter for fracture with routine healing: Secondary | ICD-10-CM | POA: Diagnosis not present

## 2014-09-05 DIAGNOSIS — I2510 Atherosclerotic heart disease of native coronary artery without angina pectoris: Secondary | ICD-10-CM | POA: Diagnosis not present

## 2014-09-05 DIAGNOSIS — F419 Anxiety disorder, unspecified: Secondary | ICD-10-CM | POA: Diagnosis not present

## 2014-09-05 DIAGNOSIS — G894 Chronic pain syndrome: Secondary | ICD-10-CM | POA: Diagnosis not present

## 2014-09-05 DIAGNOSIS — S82841D Displaced bimalleolar fracture of right lower leg, subsequent encounter for closed fracture with routine healing: Secondary | ICD-10-CM | POA: Diagnosis not present

## 2014-09-05 DIAGNOSIS — Z794 Long term (current) use of insulin: Secondary | ICD-10-CM | POA: Diagnosis not present

## 2014-09-11 DIAGNOSIS — Z9889 Other specified postprocedural states: Secondary | ICD-10-CM | POA: Diagnosis not present

## 2014-09-13 DIAGNOSIS — F419 Anxiety disorder, unspecified: Secondary | ICD-10-CM | POA: Diagnosis not present

## 2014-09-13 DIAGNOSIS — I2510 Atherosclerotic heart disease of native coronary artery without angina pectoris: Secondary | ICD-10-CM | POA: Diagnosis not present

## 2014-09-13 DIAGNOSIS — S2249XD Multiple fractures of ribs, unspecified side, subsequent encounter for fracture with routine healing: Secondary | ICD-10-CM | POA: Diagnosis not present

## 2014-09-13 DIAGNOSIS — G894 Chronic pain syndrome: Secondary | ICD-10-CM | POA: Diagnosis not present

## 2014-09-13 DIAGNOSIS — D649 Anemia, unspecified: Secondary | ICD-10-CM | POA: Diagnosis not present

## 2014-09-13 DIAGNOSIS — Z794 Long term (current) use of insulin: Secondary | ICD-10-CM | POA: Diagnosis not present

## 2014-09-13 DIAGNOSIS — S82841D Displaced bimalleolar fracture of right lower leg, subsequent encounter for closed fracture with routine healing: Secondary | ICD-10-CM | POA: Diagnosis not present

## 2014-09-13 DIAGNOSIS — E119 Type 2 diabetes mellitus without complications: Secondary | ICD-10-CM | POA: Diagnosis not present

## 2014-09-20 DIAGNOSIS — S2249XD Multiple fractures of ribs, unspecified side, subsequent encounter for fracture with routine healing: Secondary | ICD-10-CM | POA: Diagnosis not present

## 2014-09-20 DIAGNOSIS — Z794 Long term (current) use of insulin: Secondary | ICD-10-CM | POA: Diagnosis not present

## 2014-09-20 DIAGNOSIS — F419 Anxiety disorder, unspecified: Secondary | ICD-10-CM | POA: Diagnosis not present

## 2014-09-20 DIAGNOSIS — D649 Anemia, unspecified: Secondary | ICD-10-CM | POA: Diagnosis not present

## 2014-09-20 DIAGNOSIS — S82841D Displaced bimalleolar fracture of right lower leg, subsequent encounter for closed fracture with routine healing: Secondary | ICD-10-CM | POA: Diagnosis not present

## 2014-09-20 DIAGNOSIS — G894 Chronic pain syndrome: Secondary | ICD-10-CM | POA: Diagnosis not present

## 2014-09-20 DIAGNOSIS — E782 Mixed hyperlipidemia: Secondary | ICD-10-CM | POA: Diagnosis not present

## 2014-09-20 DIAGNOSIS — E119 Type 2 diabetes mellitus without complications: Secondary | ICD-10-CM | POA: Diagnosis not present

## 2014-09-20 DIAGNOSIS — I2510 Atherosclerotic heart disease of native coronary artery without angina pectoris: Secondary | ICD-10-CM | POA: Diagnosis not present

## 2014-10-10 DIAGNOSIS — Z9889 Other specified postprocedural states: Secondary | ICD-10-CM | POA: Diagnosis not present

## 2014-10-19 DIAGNOSIS — E119 Type 2 diabetes mellitus without complications: Secondary | ICD-10-CM | POA: Diagnosis not present

## 2014-10-19 DIAGNOSIS — Z79899 Other long term (current) drug therapy: Secondary | ICD-10-CM | POA: Diagnosis not present

## 2014-10-19 DIAGNOSIS — E785 Hyperlipidemia, unspecified: Secondary | ICD-10-CM | POA: Diagnosis not present

## 2014-10-19 DIAGNOSIS — I10 Essential (primary) hypertension: Secondary | ICD-10-CM | POA: Diagnosis not present

## 2014-10-21 DIAGNOSIS — G894 Chronic pain syndrome: Secondary | ICD-10-CM | POA: Diagnosis not present

## 2014-10-21 DIAGNOSIS — I2510 Atherosclerotic heart disease of native coronary artery without angina pectoris: Secondary | ICD-10-CM | POA: Diagnosis not present

## 2014-10-21 DIAGNOSIS — D649 Anemia, unspecified: Secondary | ICD-10-CM | POA: Diagnosis not present

## 2014-10-21 DIAGNOSIS — E782 Mixed hyperlipidemia: Secondary | ICD-10-CM | POA: Diagnosis not present

## 2014-10-25 DIAGNOSIS — I10 Essential (primary) hypertension: Secondary | ICD-10-CM | POA: Diagnosis not present

## 2014-10-25 DIAGNOSIS — D649 Anemia, unspecified: Secondary | ICD-10-CM | POA: Diagnosis not present

## 2014-10-25 DIAGNOSIS — E785 Hyperlipidemia, unspecified: Secondary | ICD-10-CM | POA: Diagnosis not present

## 2014-10-25 DIAGNOSIS — E119 Type 2 diabetes mellitus without complications: Secondary | ICD-10-CM | POA: Diagnosis not present

## 2014-11-07 DIAGNOSIS — Z9889 Other specified postprocedural states: Secondary | ICD-10-CM | POA: Diagnosis not present

## 2014-11-07 DIAGNOSIS — M25579 Pain in unspecified ankle and joints of unspecified foot: Secondary | ICD-10-CM | POA: Diagnosis not present

## 2014-11-20 DIAGNOSIS — I2510 Atherosclerotic heart disease of native coronary artery without angina pectoris: Secondary | ICD-10-CM | POA: Diagnosis not present

## 2014-11-20 DIAGNOSIS — D649 Anemia, unspecified: Secondary | ICD-10-CM | POA: Diagnosis not present

## 2014-11-20 DIAGNOSIS — E782 Mixed hyperlipidemia: Secondary | ICD-10-CM | POA: Diagnosis not present

## 2014-11-20 DIAGNOSIS — G894 Chronic pain syndrome: Secondary | ICD-10-CM | POA: Diagnosis not present

## 2014-12-21 DIAGNOSIS — I2510 Atherosclerotic heart disease of native coronary artery without angina pectoris: Secondary | ICD-10-CM | POA: Diagnosis not present

## 2014-12-21 DIAGNOSIS — D649 Anemia, unspecified: Secondary | ICD-10-CM | POA: Diagnosis not present

## 2014-12-21 DIAGNOSIS — E782 Mixed hyperlipidemia: Secondary | ICD-10-CM | POA: Diagnosis not present

## 2014-12-21 DIAGNOSIS — G894 Chronic pain syndrome: Secondary | ICD-10-CM | POA: Diagnosis not present

## 2014-12-25 ENCOUNTER — Other Ambulatory Visit: Payer: Self-pay

## 2014-12-25 ENCOUNTER — Encounter: Payer: Self-pay | Admitting: Medical Oncology

## 2014-12-25 ENCOUNTER — Emergency Department
Admission: EM | Admit: 2014-12-25 | Discharge: 2014-12-25 | Disposition: A | Discharge status: Home / Self Care | Payer: Commercial Managed Care - HMO | Attending: Emergency Medicine | Admitting: Emergency Medicine

## 2014-12-25 ENCOUNTER — Emergency Department: Payer: Commercial Managed Care - HMO

## 2014-12-25 DIAGNOSIS — J810 Acute pulmonary edema: Secondary | ICD-10-CM | POA: Diagnosis not present

## 2014-12-25 DIAGNOSIS — J811 Chronic pulmonary edema: Secondary | ICD-10-CM | POA: Diagnosis not present

## 2014-12-25 DIAGNOSIS — R05 Cough: Secondary | ICD-10-CM | POA: Diagnosis not present

## 2014-12-25 DIAGNOSIS — Z880 Allergy status to penicillin: Secondary | ICD-10-CM | POA: Insufficient documentation

## 2014-12-25 DIAGNOSIS — E119 Type 2 diabetes mellitus without complications: Secondary | ICD-10-CM | POA: Insufficient documentation

## 2014-12-25 DIAGNOSIS — Z794 Long term (current) use of insulin: Secondary | ICD-10-CM | POA: Insufficient documentation

## 2014-12-25 DIAGNOSIS — Z79899 Other long term (current) drug therapy: Secondary | ICD-10-CM | POA: Insufficient documentation

## 2014-12-25 DIAGNOSIS — I10 Essential (primary) hypertension: Secondary | ICD-10-CM | POA: Diagnosis not present

## 2014-12-25 DIAGNOSIS — R0602 Shortness of breath: Secondary | ICD-10-CM | POA: Diagnosis not present

## 2014-12-25 DIAGNOSIS — Z7982 Long term (current) use of aspirin: Secondary | ICD-10-CM | POA: Insufficient documentation

## 2014-12-25 LAB — BASIC METABOLIC PANEL
ANION GAP: 4 — AB (ref 5–15)
BUN: 20 mg/dL (ref 6–20)
CALCIUM: 9.1 mg/dL (ref 8.9–10.3)
CO2: 25 mmol/L (ref 22–32)
Chloride: 109 mmol/L (ref 101–111)
Creatinine, Ser: 0.94 mg/dL (ref 0.44–1.00)
GFR calc Af Amer: >60 mL/min (ref >60)
GFR calc non Af Amer: 55 mL/min — ABNORMAL LOW (ref >60)
Glucose, Bld: 158 mg/dL — ABNORMAL HIGH (ref 65–99)
Potassium: 4.8 mmol/L (ref 3.5–5.1)
Sodium: 138 mmol/L (ref 135–145)

## 2014-12-25 LAB — BRAIN NATRIURETIC PEPTIDE: B Natriuretic Peptide: 544 pg/mL — ABNORMAL HIGH (ref 0.0–100.0)

## 2014-12-25 LAB — CBC
HEMATOCRIT: 35.6 % (ref 35.0–47.0)
Hemoglobin: 11.7 g/dL — ABNORMAL LOW (ref 12.0–16.0)
MCH: 30.3 pg (ref 26.0–34.0)
MCHC: 33 g/dL (ref 32.0–36.0)
MCV: 92.1 fL (ref 80.0–100.0)
PLATELETS: 225 10*3/uL (ref 150–440)
RBC: 3.87 MIL/uL (ref 3.80–5.20)
RDW: 15.4 % — ABNORMAL HIGH (ref 11.5–14.5)
WBC: 6 10*3/uL (ref 3.6–11.0)

## 2014-12-25 LAB — TROPONIN I: TROPONIN I: 0.03 ng/mL (ref <0.031)

## 2014-12-25 MED ORDER — FUROSEMIDE 10 MG/ML IJ SOLN
INTRAMUSCULAR | Status: AC
  Administered 2014-12-25: 20 mg via INTRAVENOUS
  Filled 2014-12-25: qty 4

## 2014-12-25 MED ORDER — FUROSEMIDE 20 MG PO TABS: 20.0000 mg | ORAL_TABLET | Freq: Every day | ORAL | Status: DC

## 2014-12-25 MED ORDER — FUROSEMIDE 10 MG/ML IJ SOLN
20.0000 mg | Freq: Once | INTRAMUSCULAR | Status: AC
  Administered 2014-12-25: 20 mg via INTRAVENOUS

## 2014-12-25 NOTE — ED Notes (Signed)
Pt reports she has had cough and sob x 2 months but has worsened since last night. Denies pain.
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
## 2014-12-25 NOTE — Discharge Instructions (Signed)
Please seek medical attention for any high fevers, chest pain, shortness of breath, change in behavior, persistent vomiting, bloody stool or any other new or concerning symptoms.  Pulmonary Edema Pulmonary edema (PE) is a condition in which fluid collects in the lungs. This makes it hard to breathe. PE may be a result of the heart not pumping very well or a result of injury.  CAUSES   Coronary artery disease causes blockages in the arteries of the heart. This deprives the heart muscle of oxygen and weakens the muscle. A heart attack is a form of coronary artery disease.  High blood pressure causes the heart muscle to work harder than usual. Over time, the heart muscle may get stiff, and it starts to work less efficiently. It may also fatigue and weaken.  Viral infection of the heart (myocarditis) may weaken the heart muscle.  Metabolic conditions such as thyroid disease, excessive alcohol use, certain vitamin deficiencies, or diabetes may also weaken the heart muscle.  Leaky or stiff heart valves may impair normal heart function.  Lung disease may strain the heart muscle.  Excessive demands on the heart such as too much salt or fluid intake.  Failure to take prescribed medicines.  Lung injury from heat or toxins, such as poisonous gas.  Infection in the lungs or other parts of the body.  Fluid overload caused by kidney failure or medicines. SYMPTOMS   Shortness of breath at rest or with exertion.  Grunting, wheezing, or gurgling while breathing.  Feeling like you cannot get enough air.  Breaths are shallow and fast.  A lot of coughing with frothy or bloody mucus.  Skin may become cool, damp, and turn a pale or bluish color. DIAGNOSIS  Initial diagnosis may be based on your history, symptoms, and a physical examination. Additional tests for PE may include:  Electrocardiography.  Chest X-ray.  Blood tests.  Stress test.  Ultrasound evaluation of the heart  (echocardiography).  Evaluation by a heart doctor (cardiologist).  Test of the heart arteries to look for blockages (angiography).  Check of blood oxygen. TREATMENT  Treatment of PE will depend on the underlying cause and will focus first on relieving the symptoms.   Extra oxygen to make breathing easier and assist with removing mucus. This may include breathing treatments or a tube into the lungs and a breathing machine.  Medicine to help the body get rid of extra water, usually through an IV tube.  Medicine to help the heart pump better.  If poor heart function is the cause, treatment may include:  Procedures to open blocked arteries, repair damaged heart valves, or remove some of the damaged heart muscle.  A pacemaker to help the heart pump with less effort. HOME CARE INSTRUCTIONS   Your health care provider will help you determine what type of exercise program may be helpful. It is important to maintain strength and increase it if possible. Pace your activities to avoid shortness of breath or chest pain. Rest for at least 1 hour before and after meals. Cardiac rehabilitation programs are available in some locations.  Eat a heart-healthy diet low in salt, saturated fat, and cholesterol. Ask for help with choices.  Make a list of every medicine, vitamin, or herbal supplement you are taking. Keep the list with you at all times. Show it to your health care provider at every visit and before starting a new medicine. Keep the list up to date.  Ask your health care provider or pharmacist to help  you write a plan or schedule so that you know things about each medicine such as:  Why you are taking it.  The possible side effects.  The best time of day to take it.  Foods to take with it or avoid.  When to stop taking it.  Record your hospital or clinic weight. When you get home, compare it to your scale and record your weight. Then, weigh yourself first thing in the morning daily,  and record the weights. You should weigh yourself every morning after you urinate and before you eat breakfast. Wear the same amount of clothing each time you weigh yourself. Provide your health care provider with your weight record. Daily weights are important in the early recognition of excess fluid. Tell your health care provider right away if you have gained 03 lb/1.4 kg in 1 day, 05 lb/2.3 kg in a week, or as directed by your health care provider. Your medicines may need to be adjusted.  Blood pressure monitoring should be done as often as directed. You can get a home blood pressure cuff at your drugstore. Record these values and bring them with you for your clinic visits. Notify your health care provider if you become dizzy or light-headed when standing up.  If you are currently a smoker, it is time to quit. Nicotine makes your heart work harder and is one of the leading causes of cardiac deaths. Do not use nicotine gum or patches before talking to your doctor.  Make a follow-up appointment with your health care provider as directed.  Ask your health care provider for a copy of your latest heart tracing (ECG) and keep a copy with you at all times. SEEK IMMEDIATE MEDICAL CARE IF:   You have severe chest pain, especially if the pain is crushing or pressure-like and spreads to the arms, back, neck, or jaw. THIS IS AN EMERGENCY. Do not wait to see if the pain will go away. Call for local emergency medical help. Do not drive yourself to the hospital.  You have sweating, feel sick to your stomach (nauseous), or are experiencing shortness of breath.  Your weight increases by 03 lb/1.4 kg in 1 day or 05 lb/2.3 kg in a week.  You notice increasing shortness of breath that is unusual for you. This may happen during rest, sleep, or with activity.  You develop chest pain (angina) or pain that is unusual for you.  You notice more swelling in your hands, feet, ankles, or abdomen.  You notice lasting  (persistent) dizziness, blurred vision, headache, or unsteadiness.  You begin to cough up bloody mucus (sputum).  You are unable to sleep because it is hard to breathe.  You begin to feel a "jumping" or "fluttering" sensation (palpitations) in the chest that is unusual for you. MAKE SURE YOU:  Understand these instructions.  Will watch your condition.  Will get help right away if you are not doing well or get worse. Document Released: 09/13/2002 Document Revised: 06/28/2013 Document Reviewed: 02/28/2013 [facility] Patient Information 2015 [place], [place]. This information is not intended to replace advice given to you by your health care provider. Make sure you discuss any questions you have with your health care provider.

## 2015-01-05 ENCOUNTER — Ambulatory Visit: Payer: Commercial Managed Care - HMO | Attending: Family | Admitting: Family

## 2015-01-05 ENCOUNTER — Encounter: Payer: Self-pay | Admitting: Family

## 2015-01-05 VITALS — BP 152/60 | HR 45 | Resp 20 | Ht 61.0 in | Wt 166.0 lb

## 2015-01-05 DIAGNOSIS — D126 Benign neoplasm of colon, unspecified: Secondary | ICD-10-CM | POA: Diagnosis not present

## 2015-01-05 DIAGNOSIS — I5020 Unspecified systolic (congestive) heart failure: Secondary | ICD-10-CM | POA: Diagnosis not present

## 2015-01-05 DIAGNOSIS — D649 Anemia, unspecified: Secondary | ICD-10-CM | POA: Insufficient documentation

## 2015-01-05 DIAGNOSIS — I10 Essential (primary) hypertension: Secondary | ICD-10-CM | POA: Diagnosis not present

## 2015-01-05 DIAGNOSIS — R001 Bradycardia, unspecified: Secondary | ICD-10-CM

## 2015-01-05 DIAGNOSIS — Z79899 Other long term (current) drug therapy: Secondary | ICD-10-CM | POA: Insufficient documentation

## 2015-01-05 DIAGNOSIS — R05 Cough: Secondary | ICD-10-CM | POA: Insufficient documentation

## 2015-01-05 DIAGNOSIS — E785 Hyperlipidemia, unspecified: Secondary | ICD-10-CM | POA: Diagnosis not present

## 2015-01-05 DIAGNOSIS — G8929 Other chronic pain: Secondary | ICD-10-CM | POA: Insufficient documentation

## 2015-01-05 DIAGNOSIS — E119 Type 2 diabetes mellitus without complications: Secondary | ICD-10-CM | POA: Insufficient documentation

## 2015-01-05 DIAGNOSIS — M722 Plantar fascial fibromatosis: Secondary | ICD-10-CM | POA: Insufficient documentation

## 2015-01-05 DIAGNOSIS — IMO0001 Reserved for inherently not codable concepts without codable children: Secondary | ICD-10-CM

## 2015-01-05 DIAGNOSIS — Z794 Long term (current) use of insulin: Secondary | ICD-10-CM | POA: Diagnosis not present

## 2015-01-05 DIAGNOSIS — F419 Anxiety disorder, unspecified: Secondary | ICD-10-CM | POA: Insufficient documentation

## 2015-01-05 DIAGNOSIS — I25110 Atherosclerotic heart disease of native coronary artery with unstable angina pectoris: Secondary | ICD-10-CM | POA: Insufficient documentation

## 2015-01-05 DIAGNOSIS — I509 Heart failure, unspecified: Secondary | ICD-10-CM | POA: Insufficient documentation

## 2015-01-05 DIAGNOSIS — I5022 Chronic systolic (congestive) heart failure: Secondary | ICD-10-CM

## 2015-01-05 NOTE — Patient Instructions (Addendum)
Weigh daily and call for an overnight weight gain of >2 pounds or a weekly weight gain of >5 pounds.   Monitor sodium intake and do not add salt to your food.

## 2015-01-05 NOTE — Progress Notes (Signed)
Subjective:    Patient ID: Amber [name], female    DOB: 1930-01-31, 81 y.o.   MRN: [id number]  Shortness of Breath This is a chronic problem. The current episode started more than 1 month ago. The problem occurs daily. The problem has been unchanged. Associated symptoms include headaches (mild) and orthopnea. Pertinent negatives include no abdominal pain, chest pain, leg pain, PND, sore throat or wheezing. The symptoms are aggravated by lying flat and any activity. She has tried nothing for the symptoms. Her past medical history is significant for allergies, CAD and a heart failure. There is no history of asthma or COPD.  Congestive Heart Failure Presents for initial visit. The disease course has been stable. Associated symptoms include fatigue, orthopnea, palpitations (when coughing hard) and shortness of breath. Pertinent negatives include no abdominal pain, chest pain, chest pressure or edema. The symptoms have been stable. Past treatments include angiotensin receptor blockers and salt and fluid restriction. The treatment provided mild relief. Her past medical history is significant for CAD, DM and HTN.  Cough This is a recurrent problem. The current episode started more than 1 month ago. The problem has been unchanged. The problem occurs every few minutes. The cough is non-productive. Associated symptoms include headaches (mild) and shortness of breath. Pertinent negatives include no chest pain, ear congestion, nasal congestion, postnasal drip, sore throat or wheezing. Nothing aggravates the symptoms. She has tried OTC cough suppressant for the symptoms. The treatment provided no relief. There is no history of asthma or COPD.    Past Medical History  Diagnosis Date  . Hyperlipidemia   . Anemia   . Chronic pain   . Diabetes mellitus without complication   . Essential hypertension   . Cough   . Anxiety   . High risk medication use   . Benign neoplasm of colon   . Coronary artery  disease with unspecified angina pectoris   . Plantar fascial fibromatosis     Past Surgical History  Procedure Laterality Date  . Total knee arthroplasty Right   . Orif ankle fracture Right 07/27/2014    Procedure: OPEN REDUCTION INTERNAL FIXATION (ORIF) ANKLE FRACTURE;  Surgeon: Alta Corning, MD;  Location: [place];  Service: Orthopedics;  Laterality: Right;  . Abdominal hysterectomy    . Coronary artery bypass graft  2008  . Back surgery    . Total hip arthroplasty Right   . Rotator cuff repair    . Vein ligation and stripping      History  Substance Use Topics  . Smoking status: Never Smoker   . Smokeless tobacco: Never Used  . Alcohol Use: No    Allergies  Allergen Reactions  . Ambien [Zolpidem Tartrate] Other (See Comments)    Reaction:  Keeps pt awake   . Etodolac Other (See Comments)    Reaction:  Unknown   . Iodine Itching  . Nsaids Other (See Comments)    Reaction:  Unknown   . Penicillins Itching  . Succinylcholine Other (See Comments)    Reaction:  Unknown     Prior to Admission medications   Medication Sig Start Date End Date Taking? Authorizing Provider  alendronate (FOSAMAX) 70 MG tablet Take 70 mg by mouth once a week. Pt takes on Thursday.   Yes Historical Provider, MD  ALPRAZolam Duanne Moron) 1 MG tablet Take 1 tablet (1 mg total) by mouth at bedtime as needed for anxiety. 07/31/14  Yes Silver Huguenin Elgergawy, MD  amLODipine (NORVASC) 10 MG  tablet Take 5 mg by mouth daily.   Yes Historical Provider, MD  aspirin EC 81 MG tablet Take 81 mg by mouth daily.   Yes Historical Provider, MD  calcium citrate-vitamin D (CITRACAL+D) 315-200 MG-UNIT per tablet Take 1 tablet by mouth 2 (two) times daily.   Yes Historical Provider, MD  furosemide (LASIX) 20 MG tablet Take 1 tablet (20 mg total) by mouth daily. 12/25/14 12/25/15 Yes Nance Pear, MD  gabapentin (NEURONTIN) 100 MG capsule Take 100 mg by mouth 2 (two) times daily.   Yes Historical Provider, MD  insulin regular  (NOVOLIN R,HUMULIN R) 100 units/mL injection Inject 10-25 Units into the skin 2 (two) times daily. Pt uses 10 units in the morning and 25 units at bedtime.   Yes Historical Provider, MD  losartan (COZAAR) 100 MG tablet Take 100 mg by mouth daily.   Yes Historical Provider, MD  Multiple Vitamins-Minerals (PRESERVISION AREDS 2) CAPS Take 1 capsule by mouth daily.   Yes Historical Provider, MD  omeprazole (PRILOSEC) 20 MG capsule Take 20 mg by mouth daily.   Yes Historical Provider, MD  oxyCODONE-acetaminophen (PERCOCET/ROXICET) 5-325 MG per tablet Take 1 tablet by mouth every 6 (six) hours as needed for moderate pain. 07/31/14  Yes Silver Huguenin Elgergawy, MD  pravastatin (PRAVACHOL) 40 MG tablet Take 40 mg by mouth at bedtime.   Yes Historical Provider, MD  ferrous fumarate (HEMOCYTE - 106 MG FE) 325 (106 FE) MG TABS tablet Take 1 tablet by mouth daily.    Historical Provider, MD      Review of Systems  Constitutional: Positive for fatigue. Negative for appetite change.  HENT: Negative for postnasal drip, sore throat and trouble swallowing.   Eyes: Negative.   Respiratory: Positive for cough (last 3 months) and shortness of breath. Negative for chest tightness and wheezing.   Cardiovascular: Positive for palpitations (when coughing hard) and orthopnea. Negative for chest pain and PND.  Gastrointestinal: Positive for diarrhea. Negative for abdominal pain and abdominal distention.  Endocrine: Negative.   Genitourinary: Positive for frequency (with fluid pills). Negative for dysuria.  Musculoskeletal: Positive for back pain (on occasion) and arthralgias ("all over body").  Skin: Negative.   Allergic/Immunologic: Negative.   Neurological: Positive for headaches (mild). Negative for dizziness and weakness.  Hematological: Negative for adenopathy. Does not bruise/bleed easily.  Psychiatric/Behavioral: Positive for sleep disturbance (sleeping on 2-3 pillows). The patient is not nervous/anxious.         Objective:   Physical Exam  Constitutional: She is oriented to person, place, and time. She appears well-developed and well-nourished.  HENT:  Head: Normocephalic and atraumatic.  Eyes: Conjunctivae are normal. Pupils are equal, round, and reactive to light.  Neck: Normal range of motion. Neck supple.  Cardiovascular: Regular rhythm.  Bradycardia present.   Pulmonary/Chest: Effort normal and breath sounds normal. She has no wheezes. She has no rales.  Abdominal: Soft. She exhibits no distension. There is no tenderness.  Musculoskeletal: She exhibits no edema or tenderness.  Neurological: She is alert and oriented to person, place, and time.  Skin: Skin is warm and dry.  Psychiatric: She has a normal mood and affect. Her behavior is normal. Thought content normal.  Nursing note and vitals reviewed.   BP 152/60 mmHg  Pulse 45  Resp 20  Ht 5\' 1"  (1.549 m)  Wt 166 lb (75.297 kg)  BMI 31.38 kg/m2  SpO2 98%        Assessment & Plan:  1: Chronic heart failure with  reduced ejection fraction- Patient presents with shortness of breath and fatigue with exertion. She says that she was a little short of breath upon walking into the office today. When she does experience symptoms, she will stop to rest until her symptoms subside. She does admit that she doesn't do much activity because of her ankle pain. She does weigh herself but hasn't been weighing daily. Instructed to weigh daily and call for an overnight weight gain of >2 pounds or a weekly weight gain of >5 pounds. She doesn't add salt to her foods but says that some of her foods are cooked with salt and actually taste salty to her. Discussed a 2000mg  sodium diet and reviewed how to read food labels. Written information was also provided. She says that she really doesn't do much activity because of her tiredness and because of her ankle pain. Encouraged her to be as active as possible with allowing for frequent rest periods. She has not had  an echocardiogram done in years (last one in 2008 showed an EF of 20%) and she has an appointment with her cardiologist on 01/17/15. 2: HTN- Blood pressure mildly elevated but patient is talking and coughing quite a bit. Will continue to monitor. Continue medications at this time. 3: Diabetes- Patient says that she usually checks her glucose levels every other day. She last checked it yesterday morning and it was 124. She follows with her PCP regarding her diabetes. She was encouraged to check her glucose on a daily basis.  4: Bradycardia- Patient's heart rate is 45 today and she says that her heart rate has been in the 40's. This could certainly be contributing to her fatigue. She's really not taking anything, except amlodipine, that could be making her heart rate low. She has an appointment with her cardiologist on 01/17/15.  Return in 1 month or sooner for any questions/problems before the next office visit.

## 2015-01-17 DIAGNOSIS — I5023 Acute on chronic systolic (congestive) heart failure: Secondary | ICD-10-CM | POA: Diagnosis not present

## 2015-01-17 DIAGNOSIS — R001 Bradycardia, unspecified: Secondary | ICD-10-CM | POA: Diagnosis not present

## 2015-01-17 DIAGNOSIS — R05 Cough: Secondary | ICD-10-CM | POA: Diagnosis not present

## 2015-01-20 DIAGNOSIS — I2510 Atherosclerotic heart disease of native coronary artery without angina pectoris: Secondary | ICD-10-CM | POA: Diagnosis not present

## 2015-01-20 DIAGNOSIS — E782 Mixed hyperlipidemia: Secondary | ICD-10-CM | POA: Diagnosis not present

## 2015-01-20 DIAGNOSIS — G894 Chronic pain syndrome: Secondary | ICD-10-CM | POA: Diagnosis not present

## 2015-01-20 DIAGNOSIS — D649 Anemia, unspecified: Secondary | ICD-10-CM | POA: Diagnosis not present

## 2015-01-24 DIAGNOSIS — R001 Bradycardia, unspecified: Secondary | ICD-10-CM | POA: Diagnosis not present

## 2015-01-26 DIAGNOSIS — I5023 Acute on chronic systolic (congestive) heart failure: Secondary | ICD-10-CM | POA: Diagnosis not present

## 2015-01-29 DIAGNOSIS — E119 Type 2 diabetes mellitus without complications: Secondary | ICD-10-CM | POA: Diagnosis not present

## 2015-01-29 DIAGNOSIS — Z79899 Other long term (current) drug therapy: Secondary | ICD-10-CM | POA: Diagnosis not present

## 2015-01-29 DIAGNOSIS — G894 Chronic pain syndrome: Secondary | ICD-10-CM | POA: Diagnosis not present

## 2015-01-29 DIAGNOSIS — R05 Cough: Secondary | ICD-10-CM | POA: Diagnosis not present

## 2015-01-29 DIAGNOSIS — I10 Essential (primary) hypertension: Secondary | ICD-10-CM | POA: Diagnosis not present

## 2015-01-29 DIAGNOSIS — E782 Mixed hyperlipidemia: Secondary | ICD-10-CM | POA: Diagnosis not present

## 2015-02-09 ENCOUNTER — Ambulatory Visit: Payer: Commercial Managed Care - HMO | Admitting: Family

## 2015-02-09 ENCOUNTER — Telehealth: Payer: Self-pay | Admitting: Family

## 2015-02-09 NOTE — Telephone Encounter (Signed)
Patient missed her followup appointment at the [facility] today.

## 2015-02-16 ENCOUNTER — Ambulatory Visit: Payer: Commercial Managed Care - HMO | Attending: Family | Admitting: Family

## 2015-02-16 ENCOUNTER — Encounter: Payer: Self-pay | Admitting: Family

## 2015-02-16 VITALS — BP 145/34 | HR 40 | Resp 20 | Ht 61.0 in | Wt 169.0 lb

## 2015-02-16 DIAGNOSIS — I2510 Atherosclerotic heart disease of native coronary artery without angina pectoris: Secondary | ICD-10-CM | POA: Insufficient documentation

## 2015-02-16 DIAGNOSIS — Z794 Long term (current) use of insulin: Secondary | ICD-10-CM | POA: Insufficient documentation

## 2015-02-16 DIAGNOSIS — Z7982 Long term (current) use of aspirin: Secondary | ICD-10-CM | POA: Insufficient documentation

## 2015-02-16 DIAGNOSIS — E785 Hyperlipidemia, unspecified: Secondary | ICD-10-CM | POA: Insufficient documentation

## 2015-02-16 DIAGNOSIS — F419 Anxiety disorder, unspecified: Secondary | ICD-10-CM | POA: Diagnosis not present

## 2015-02-16 DIAGNOSIS — Z79899 Other long term (current) drug therapy: Secondary | ICD-10-CM | POA: Diagnosis not present

## 2015-02-16 DIAGNOSIS — R001 Bradycardia, unspecified: Secondary | ICD-10-CM | POA: Diagnosis not present

## 2015-02-16 DIAGNOSIS — I5032 Chronic diastolic (congestive) heart failure: Secondary | ICD-10-CM | POA: Insufficient documentation

## 2015-02-16 DIAGNOSIS — I10 Essential (primary) hypertension: Secondary | ICD-10-CM | POA: Diagnosis not present

## 2015-02-16 DIAGNOSIS — E119 Type 2 diabetes mellitus without complications: Secondary | ICD-10-CM | POA: Diagnosis not present

## 2015-02-16 DIAGNOSIS — IMO0001 Reserved for inherently not codable concepts without codable children: Secondary | ICD-10-CM

## 2015-02-16 NOTE — Progress Notes (Signed)
Subjective:    Patient ID: Amber [name], female    DOB: 12-30-1929, 81 y.o.   MRN: [id number]  Congestive Heart Failure Presents for follow-up visit. The disease course has been stable. Associated symptoms include fatigue. Pertinent negatives include no abdominal pain, chest pain, chest pressure, edema, orthopnea, palpitations or shortness of breath. The symptoms have been improving. Past treatments include angiotensin receptor blockers and salt and fluid restriction. The treatment provided significant relief. Compliance with prior treatments has been good. Her past medical history is significant for DM and HTN. Compliance with total regimen is 76-100%.  Fall The accident occurred 5 to 7 days ago. Fall occurred: off the commode. She fell from a height of 1 to 2 ft. Impact surface: corner of cabinet. There was no blood loss. The point of impact was the head (around left eye). Associated symptoms include numbness (in lower legs). Pertinent negatives include no abdominal pain, fever, nausea or visual change.   Past Medical History  Diagnosis Date  . Hyperlipidemia   . Anemia   . Chronic pain   . Diabetes mellitus without complication   . Essential hypertension   . Cough   . Anxiety   . High risk medication use   . Benign neoplasm of colon   . Coronary artery disease with unspecified angina pectoris   . Plantar fascial fibromatosis     Past Surgical History  Procedure Laterality Date  . Total knee arthroplasty Right   . Orif ankle fracture Right 07/27/2014    Procedure: OPEN REDUCTION INTERNAL FIXATION (ORIF) ANKLE FRACTURE;  Surgeon: Alta Corning, MD;  Location: [place];  Service: Orthopedics;  Laterality: Right;  . Abdominal hysterectomy    . Coronary artery bypass graft  2008  . Back surgery    . Total hip arthroplasty Right   . Rotator cuff repair    . Vein ligation and stripping      Family History  Problem Relation Age of Onset  . Heart attack Mother   . Heart  disease Father   . Alzheimer's disease Sister   . Cervical cancer Sister   . Heart failure Son     Social History  Substance Use Topics  . Smoking status: Never Smoker   . Smokeless tobacco: Never Used  . Alcohol Use: No    Allergies  Allergen Reactions  . Ambien [Zolpidem Tartrate] Other (See Comments)    Reaction:  Keeps pt awake   . Etodolac Other (See Comments)    Reaction:  Unknown   . Iodine Itching  . Nsaids Other (See Comments)    Reaction:  Unknown   . Penicillins Itching  . Succinylcholine Other (See Comments)    Reaction:  Unknown     Prior to Admission medications   Medication Sig Start Date End Date Taking? Authorizing Provider  alendronate (FOSAMAX) 70 MG tablet Take 70 mg by mouth once a week. Pt takes on Thursday.   Yes Historical Provider, MD  ALPRAZolam Duanne Moron) 1 MG tablet Take 1 tablet (1 mg total) by mouth at bedtime as needed for anxiety. 07/31/14  Yes Silver Huguenin Elgergawy, MD  amLODipine (NORVASC) 10 MG tablet Take 5 mg by mouth daily.   Yes Historical Provider, MD  aspirin EC 81 MG tablet Take 81 mg by mouth daily.   Yes Historical Provider, MD  calcium citrate-vitamin D (CITRACAL+D) 315-200 MG-UNIT per tablet Take 1 tablet by mouth 2 (two) times daily.   Yes Historical Provider, MD  ferrous  fumarate (HEMOCYTE - 106 MG FE) 325 (106 FE) MG TABS tablet Take 1 tablet by mouth daily.   Yes Historical Provider, MD  Fluticasone-Salmeterol (ADVAIR) 250-50 MCG/DOSE AEPB Inhale 1 puff into the lungs 2 (two) times daily.   Yes Historical Provider, MD  furosemide (LASIX) 20 MG tablet Take 1 tablet (20 mg total) by mouth daily. 12/25/14 12/25/15 Yes Nance Pear, MD  gabapentin (NEURONTIN) 100 MG capsule Take 100 mg by mouth 2 (two) times daily.   Yes Historical Provider, MD  insulin regular (NOVOLIN R,HUMULIN R) 100 units/mL injection Inject 10-25 Units into the skin 2 (two) times daily. Pt uses 10 units in the morning and 25 units at bedtime.   Yes Historical  Provider, MD  Multiple Vitamins-Minerals (PRESERVISION AREDS 2) CAPS Take 1 capsule by mouth daily.   Yes Historical Provider, MD  omeprazole (PRILOSEC) 20 MG capsule Take 20 mg by mouth daily.   Yes Historical Provider, MD  oxyCODONE-acetaminophen (PERCOCET/ROXICET) 5-325 MG per tablet Take 1 tablet by mouth every 6 (six) hours as needed for moderate pain. 07/31/14  Yes Silver Huguenin Elgergawy, MD  pravastatin (PRAVACHOL) 40 MG tablet Take 40 mg by mouth at bedtime.   Yes Historical Provider, MD  losartan (COZAAR) 100 MG tablet Take 100 mg by mouth daily.    Historical Provider, MD      Review of Systems  Constitutional: Positive for fatigue. Negative for fever and appetite change.  HENT: Negative for congestion, postnasal drip and sore throat.   Eyes: Positive for visual disturbance (lost glasses). Negative for pain.  Respiratory: Positive for cough. Negative for chest tightness, shortness of breath and wheezing.   Cardiovascular: Positive for leg swelling. Negative for chest pain and palpitations.  Gastrointestinal: Negative for nausea, abdominal pain and abdominal distention.  Endocrine: Negative.   Genitourinary: Negative.   Musculoskeletal: Positive for back pain. Negative for neck pain.  Skin: Positive for color change. Negative for rash.       Bruising noted around the left eye due to recent fall  Allergic/Immunologic: Negative.   Neurological: Positive for numbness (in lower legs). Negative for dizziness and light-headedness.  Hematological: Negative for adenopathy. Bruises/bleeds easily.  Psychiatric/Behavioral: Negative for sleep disturbance (sleeping on 2 pillows) and dysphoric mood. The patient is nervous/anxious.        Objective:   Physical Exam  Constitutional: She is oriented to person, place, and time. She appears well-developed and well-nourished.  HENT:  Head: Normocephalic. Head is with contusion (around left eye orbit).  Eyes: Conjunctivae are normal. Pupils are  equal, round, and reactive to light.  Neck: Normal range of motion. Neck supple.  Cardiovascular: An irregular rhythm present. Bradycardia present.   Pulmonary/Chest: Effort normal. She has no wheezes. She has no rales.  Abdominal: Soft. She exhibits no distension. There is no tenderness.  Musculoskeletal: She exhibits no edema or tenderness.  Neurological: She is alert and oriented to person, place, and time.  Skin: Skin is warm and dry. Bruising (around left eye orbit) noted.  Psychiatric: She has a normal mood and affect. Her behavior is normal. Thought content normal.  Nursing note and vitals reviewed.  BP 145/34 mmHg  Pulse 40  Resp 20  Ht 5\' 1"  (1.549 m)  Wt 169 lb (76.658 kg)  BMI 31.95 kg/m2  SpO2 100%        Assessment & Plan:  1: Chronic heart failure with preserved ejection fraction- Patient presents with minimal symptoms at this time. She denies any shortness of  breath and only minimal fatigue but she attributes that because she is so busy running her grandkids around. She continues to weigh herself daily and her weight chart shows a stable weight. Reminded to call for an overnight weight gain of >2 pounds or a weekly weight gain of >5 pounds. She is not adding any salt to her food and does try to follow a low sodium diet. An echocardiogram was done on 01/26/15 and shows an EF of 55%. Has a followup with her cardiologist on 02/23/15. 2: HTN- Blood pressure is ok today. Continue medications at this time. 3: Bradycardia- Heart rate remains quite low and, again, she sees her cardiologist on 02/23/15. She says that there hasn't been a discussion about a pacemaker. 4: Diabetes- Glucose this morning was 116. She continues to use her insulin and does adjust it somewhat based on her glucose readings. Follows with her PCP regarding this.  5: Fall- Patient says that she fell about a week ago. She says that she was sitting on the commode and just fell asleep and she guesses just fell over.  She said that she hit the left side of her head around the eye on the corner of a cabinet. Extensive bruising noted but no visual issues notes by patient (other than she's lost her reading glasses).   Return in 3 months or sooner for any questions/problems before then.

## 2015-02-16 NOTE — Patient Instructions (Addendum)
Continue weighing daily and call for an overnight weight gain of > 2 pounds or a weekly weight gain of >5 pounds. 

## 2015-02-20 DIAGNOSIS — G894 Chronic pain syndrome: Secondary | ICD-10-CM | POA: Diagnosis not present

## 2015-02-20 DIAGNOSIS — I2510 Atherosclerotic heart disease of native coronary artery without angina pectoris: Secondary | ICD-10-CM | POA: Diagnosis not present

## 2015-02-20 DIAGNOSIS — E782 Mixed hyperlipidemia: Secondary | ICD-10-CM | POA: Diagnosis not present

## 2015-02-20 DIAGNOSIS — D649 Anemia, unspecified: Secondary | ICD-10-CM | POA: Diagnosis not present

## 2015-02-23 DIAGNOSIS — I10 Essential (primary) hypertension: Secondary | ICD-10-CM | POA: Diagnosis not present

## 2015-02-23 DIAGNOSIS — I495 Sick sinus syndrome: Secondary | ICD-10-CM | POA: Diagnosis not present

## 2015-02-23 DIAGNOSIS — I25810 Atherosclerosis of coronary artery bypass graft(s) without angina pectoris: Secondary | ICD-10-CM | POA: Diagnosis not present

## 2015-02-23 DIAGNOSIS — I2510 Atherosclerotic heart disease of native coronary artery without angina pectoris: Secondary | ICD-10-CM | POA: Diagnosis present

## 2015-02-23 DIAGNOSIS — I480 Paroxysmal atrial fibrillation: Secondary | ICD-10-CM | POA: Diagnosis not present

## 2015-03-05 ENCOUNTER — Ambulatory Visit
Admission: RE | Admit: 2015-03-05 | Discharge: 2015-03-05 | Disposition: A | Discharge status: Home / Self Care | Payer: Commercial Managed Care - HMO | Source: Ambulatory Visit | Attending: Cardiology | Admitting: Cardiology

## 2015-03-05 ENCOUNTER — Encounter
Admission: RE | Admit: 2015-03-05 | Discharge: 2015-03-05 | Disposition: A | Discharge status: Home / Self Care | Payer: Commercial Managed Care - HMO | Source: Ambulatory Visit | Attending: Cardiology | Admitting: Cardiology

## 2015-03-05 DIAGNOSIS — Z8679 Personal history of other diseases of the circulatory system: Secondary | ICD-10-CM

## 2015-03-05 DIAGNOSIS — I495 Sick sinus syndrome: Secondary | ICD-10-CM | POA: Diagnosis not present

## 2015-03-05 DIAGNOSIS — Z01818 Encounter for other preprocedural examination: Secondary | ICD-10-CM | POA: Insufficient documentation

## 2015-03-05 DIAGNOSIS — S2242XD Multiple fractures of ribs, left side, subsequent encounter for fracture with routine healing: Secondary | ICD-10-CM | POA: Diagnosis not present

## 2015-03-05 HISTORY — DX: Heart failure, unspecified: I50.9

## 2015-03-05 HISTORY — DX: Adverse effect of unspecified anesthetic, initial encounter: T41.45XA

## 2015-03-05 HISTORY — DX: Unspecified osteoarthritis, unspecified site: M19.90

## 2015-03-05 LAB — PROTIME-INR
INR: 1.26
Prothrombin Time: 16 seconds — ABNORMAL HIGH (ref 11.4–15.0)

## 2015-03-05 LAB — BASIC METABOLIC PANEL
ANION GAP: 9 (ref 5–15)
BUN: 22 mg/dL — ABNORMAL HIGH (ref 6–20)
CO2: 27 mmol/L (ref 22–32)
Calcium: 9.9 mg/dL (ref 8.9–10.3)
Chloride: 100 mmol/L — ABNORMAL LOW (ref 101–111)
Creatinine, Ser: 0.83 mg/dL (ref 0.44–1.00)
GFR calc Af Amer: >60 mL/min (ref >60)
GFR calc non Af Amer: >60 mL/min (ref >60)
GLUCOSE: 175 mg/dL — AB (ref 65–99)
Potassium: 4.1 mmol/L (ref 3.5–5.1)
Sodium: 136 mmol/L (ref 135–145)

## 2015-03-05 LAB — DIFFERENTIAL
BASOS ABS: 0.1 10*3/uL (ref 0–0.1)
Basophils Relative: 1 %
Eosinophils Absolute: 0.2 10*3/uL (ref 0–0.7)
Eosinophils Relative: 4 %
LYMPHS ABS: 1.1 10*3/uL (ref 1.0–3.6)
LYMPHS PCT: 18 %
MONOS PCT: 9 %
Monocytes Absolute: 0.6 10*3/uL (ref 0.2–0.9)
Neutro Abs: 4.4 10*3/uL (ref 1.4–6.5)
Neutrophils Relative %: 68 %

## 2015-03-05 LAB — SURGICAL PCR SCREEN
MRSA, PCR: NEGATIVE
Staphylococcus aureus: POSITIVE — AB

## 2015-03-05 LAB — APTT: APTT: 35 s (ref 24–36)

## 2015-03-05 LAB — CBC
HCT: 40.6 % (ref 35.0–47.0)
HEMOGLOBIN: 13.1 g/dL (ref 12.0–16.0)
MCH: 28.8 pg (ref 26.0–34.0)
MCHC: 32.4 g/dL (ref 32.0–36.0)
MCV: 88.9 fL (ref 80.0–100.0)
Platelets: 220 10*3/uL (ref 150–440)
RBC: 4.57 MIL/uL (ref 3.80–5.20)
RDW: 14.5 % (ref 11.5–14.5)
WBC: 6.5 10*3/uL (ref 3.6–11.0)

## 2015-03-05 NOTE — Patient Instructions (Addendum)
  Your procedure is scheduled on: Wednesday March 14, 2015 Report to Same Day Surgery. To find out your arrival time please call [phone] between 1PM - 3PM on Tuesday March 13, 2015.  Remember: Instructions that are not followed completely may result in serious medical risk, up to and including death, or upon the discretion of your surgeon and anesthesiologist your surgery may need to be rescheduled.    _x___ 1. Do not eat food or drink liquids after midnight. No gum chewing or hard candies.     ____ 2. No Alcohol for 24 hours before or after surgery.   ____ 3. Bring all medications with you on the day of surgery if instructed.    _x___ 4. Notify your doctor if there is any change in your medical condition     (cold, fever, infections).     Do not wear jewelry, make-up, hairpins, clips or nail polish.  Do not wear lotions, powders, or perfumes. You may wear deodorant.  Do not shave 48 hours prior to surgery. Men may shave face and neck.  Do not bring valuables to the hospital.    [facility] is not responsible for any belongings or valuables.               Contacts, dentures or bridgework may not be worn into surgery.  Leave your suitcase in the car. After surgery it may be brought to your room.  For patients admitted to the hospital, discharge time is determined by your treatment team.   Patients discharged the day of surgery will not be allowed to drive home.    Please read over the following fact sheets that you were given:   [facility] Preparing for Surgery  _x___ Take these medicines the morning of surgery with A SIP OF WATER:    1. amLODipine (NORVASC)  2. gabapentin (NEURONTIN)  3. losartan (COZAAR)  4.omeprazole (PRILOSEC)   ____ Fleet Enema (as directed)   ____ Use CHG Soap as directed  __x__ Use inhalers on the day of surgery  ____ Stop metformin 2 days prior to surgery    _x___ Take 1/2 of usual insulin dose the night before surgery and none on  the morning of surgery.   __x__ Pt will check with Dr. Nehemiah Massed as to when to stop the Eliquis.  ____ Stop Anti-inflammatories on does not apply.   ____ Stop supplements until after surgery.    ____ Bring C-Pap to the hospital.

## 2015-03-05 NOTE — Pre-Procedure Instructions (Signed)
Message left on [place] daughter Stephanie's cell phone for pt to call PAT at [phone].  When pt calls instruct pt to shower with CHG soap for 5 days prior to surgery for positive Staph Aureus.

## 2015-03-05 NOTE — Pre-Procedure Instructions (Signed)
Staph Aureus result came back positive today. Besty at Dr. Saralyn Pilar' office notified at 1550.

## 2015-03-06 NOTE — OR Nursing (Signed)
Left message for Amber [name] to instruct patient to shower with chg soap x 5 days pre-op

## 2015-03-14 ENCOUNTER — Encounter
Admission: RE | Disposition: A | Discharge status: Home / Self Care | Payer: Self-pay | Source: Ambulatory Visit | Attending: Cardiology

## 2015-03-14 ENCOUNTER — Ambulatory Visit: Payer: Commercial Managed Care - HMO | Admitting: Anesthesiology

## 2015-03-14 ENCOUNTER — Observation Stay: Payer: Commercial Managed Care - HMO

## 2015-03-14 ENCOUNTER — Ambulatory Visit
Admission: RE | Admit: 2015-03-14 | Discharge: 2015-03-15 | Disposition: A | Discharge status: Home / Self Care | Payer: Commercial Managed Care - HMO | Source: Ambulatory Visit | Attending: Cardiology | Admitting: Cardiology

## 2015-03-14 ENCOUNTER — Encounter: Payer: Self-pay | Admitting: *Deleted

## 2015-03-14 ENCOUNTER — Ambulatory Visit: Payer: Commercial Managed Care - HMO

## 2015-03-14 DIAGNOSIS — I8390 Asymptomatic varicose veins of unspecified lower extremity: Secondary | ICD-10-CM | POA: Insufficient documentation

## 2015-03-14 DIAGNOSIS — Z79899 Other long term (current) drug therapy: Secondary | ICD-10-CM | POA: Insufficient documentation

## 2015-03-14 DIAGNOSIS — I481 Persistent atrial fibrillation: Secondary | ICD-10-CM | POA: Diagnosis not present

## 2015-03-14 DIAGNOSIS — M160 Bilateral primary osteoarthritis of hip: Secondary | ICD-10-CM | POA: Insufficient documentation

## 2015-03-14 DIAGNOSIS — M5136 Other intervertebral disc degeneration, lumbar region: Secondary | ICD-10-CM | POA: Insufficient documentation

## 2015-03-14 DIAGNOSIS — E119 Type 2 diabetes mellitus without complications: Secondary | ICD-10-CM | POA: Diagnosis not present

## 2015-03-14 DIAGNOSIS — I10 Essential (primary) hypertension: Secondary | ICD-10-CM | POA: Diagnosis not present

## 2015-03-14 DIAGNOSIS — M19011 Primary osteoarthritis, right shoulder: Secondary | ICD-10-CM | POA: Insufficient documentation

## 2015-03-14 DIAGNOSIS — Z950 Presence of cardiac pacemaker: Secondary | ICD-10-CM

## 2015-03-14 DIAGNOSIS — E669 Obesity, unspecified: Secondary | ICD-10-CM | POA: Diagnosis not present

## 2015-03-14 DIAGNOSIS — K219 Gastro-esophageal reflux disease without esophagitis: Secondary | ICD-10-CM | POA: Diagnosis not present

## 2015-03-14 DIAGNOSIS — Z794 Long term (current) use of insulin: Secondary | ICD-10-CM | POA: Insufficient documentation

## 2015-03-14 DIAGNOSIS — I495 Sick sinus syndrome: Secondary | ICD-10-CM | POA: Diagnosis not present

## 2015-03-14 DIAGNOSIS — I509 Heart failure, unspecified: Secondary | ICD-10-CM | POA: Diagnosis not present

## 2015-03-14 DIAGNOSIS — Z86010 Personal history of colonic polyps: Secondary | ICD-10-CM | POA: Diagnosis not present

## 2015-03-14 DIAGNOSIS — M19012 Primary osteoarthritis, left shoulder: Secondary | ICD-10-CM | POA: Insufficient documentation

## 2015-03-14 DIAGNOSIS — E785 Hyperlipidemia, unspecified: Secondary | ICD-10-CM | POA: Insufficient documentation

## 2015-03-14 DIAGNOSIS — Z951 Presence of aortocoronary bypass graft: Secondary | ICD-10-CM | POA: Diagnosis not present

## 2015-03-14 DIAGNOSIS — I2510 Atherosclerotic heart disease of native coronary artery without angina pectoris: Secondary | ICD-10-CM | POA: Diagnosis not present

## 2015-03-14 DIAGNOSIS — D649 Anemia, unspecified: Secondary | ICD-10-CM | POA: Diagnosis not present

## 2015-03-14 DIAGNOSIS — R001 Bradycardia, unspecified: Secondary | ICD-10-CM | POA: Diagnosis not present

## 2015-03-14 DIAGNOSIS — Z7901 Long term (current) use of anticoagulants: Secondary | ICD-10-CM | POA: Insufficient documentation

## 2015-03-14 DIAGNOSIS — M170 Bilateral primary osteoarthritis of knee: Secondary | ICD-10-CM | POA: Insufficient documentation

## 2015-03-14 DIAGNOSIS — Z7951 Long term (current) use of inhaled steroids: Secondary | ICD-10-CM | POA: Insufficient documentation

## 2015-03-14 HISTORY — PX: PACEMAKER INSERTION: SHX728

## 2015-03-14 LAB — GLUCOSE, CAPILLARY
Glucose-Capillary: 174 mg/dL — ABNORMAL HIGH (ref 65–99)
Glucose-Capillary: 157 mg/dL — ABNORMAL HIGH (ref 65–99)
Glucose-Capillary: 146 mg/dL — ABNORMAL HIGH (ref 65–99)

## 2015-03-14 SURGERY — INSERTION, CARDIAC PACEMAKER
Anesthesia: Monitor Anesthesia Care | Site: Chest | Laterality: Left | Wound class: Clean

## 2015-03-14 MED ORDER — AMLODIPINE BESYLATE 10 MG PO TABS
10.0000 mg | ORAL_TABLET | Freq: Every day | ORAL | Status: DC
  Administered 2015-03-15: 10 mg via ORAL
  Filled 2015-03-14: qty 1

## 2015-03-14 MED ORDER — PROPOFOL 10 MG/ML IV BOLUS
INTRAVENOUS | Status: DC | PRN
  Administered 2015-03-14: 40 mg via INTRAVENOUS

## 2015-03-14 MED ORDER — DIPHENHYDRAMINE HCL 25 MG PO CAPS
25.0000 mg | ORAL_CAPSULE | Freq: Every evening | ORAL | Status: DC | PRN
  Administered 2015-03-15: 25 mg via ORAL
  Filled 2015-03-14: qty 1

## 2015-03-14 MED ORDER — FENTANYL CITRATE (PF) 100 MCG/2ML IJ SOLN: 25.0000 ug | INTRAMUSCULAR | Status: DC | PRN

## 2015-03-14 MED ORDER — INSULIN ASPART 100 UNIT/ML ~~LOC~~ SOLN: 25.0000 [IU] | Freq: Every evening | SUBCUTANEOUS | Status: DC

## 2015-03-14 MED ORDER — MOMETASONE FURO-FORMOTEROL FUM 100-5 MCG/ACT IN AERO
2.0000 | INHALATION_SPRAY | Freq: Two times a day (BID) | RESPIRATORY_TRACT | Status: DC
  Administered 2015-03-14 – 2015-03-15 (×2): 2 via RESPIRATORY_TRACT
  Filled 2015-03-14: qty 8.8

## 2015-03-14 MED ORDER — INSULIN ASPART 100 UNIT/ML ~~LOC~~ SOLN
10.0000 [IU] | Freq: Every morning | SUBCUTANEOUS | Status: DC
  Filled 2015-03-14: qty 10

## 2015-03-14 MED ORDER — LOSARTAN POTASSIUM 50 MG PO TABS
100.0000 mg | ORAL_TABLET | Freq: Every day | ORAL | Status: DC
  Administered 2015-03-15: 100 mg via ORAL
  Filled 2015-03-14: qty 2

## 2015-03-14 MED ORDER — ALPRAZOLAM 0.5 MG PO TABS
1.0000 mg | ORAL_TABLET | Freq: Two times a day (BID) | ORAL | Status: DC | PRN
  Administered 2015-03-14: 1 mg via ORAL
  Filled 2015-03-14: qty 2

## 2015-03-14 MED ORDER — INSULIN ASPART 100 UNIT/ML ~~LOC~~ SOLN
10.0000 [IU] | Freq: Two times a day (BID) | SUBCUTANEOUS | Status: DC
  Administered 2015-03-15: 10 [IU] via SUBCUTANEOUS
  Filled 2015-03-14: qty 10

## 2015-03-14 MED ORDER — PROPOFOL INFUSION 10 MG/ML OPTIME
INTRAVENOUS | Status: DC | PRN
  Administered 2015-03-14: 100 ug/kg/min via INTRAVENOUS

## 2015-03-14 MED ORDER — VANCOMYCIN HCL IN DEXTROSE 1-5 GM/200ML-% IV SOLN
INTRAVENOUS | Status: AC
  Administered 2015-03-14: 1 g
  Filled 2015-03-14: qty 200

## 2015-03-14 MED ORDER — LIDOCAINE HCL (CARDIAC) 20 MG/ML IV SOLN
INTRAVENOUS | Status: DC | PRN
  Administered 2015-03-14: 50 mg via INTRAVENOUS

## 2015-03-14 MED ORDER — SODIUM CHLORIDE 0.9 % IR SOLN
Status: DC | PRN
  Administered 2015-03-14: 2 mL

## 2015-03-14 MED ORDER — GENTAMICIN SULFATE 40 MG/ML IJ SOLN
Freq: Once | INTRAMUSCULAR | Status: DC
  Filled 2015-03-14: qty 2

## 2015-03-14 MED ORDER — ACETAMINOPHEN 325 MG PO TABS
325.0000 mg | ORAL_TABLET | ORAL | Status: DC | PRN
Start: 2015-03-14 — End: 2015-03-15
  Administered 2015-03-14: 650 mg via ORAL
  Administered 2015-03-14: 325 mg via ORAL
  Administered 2015-03-15: 650 mg via ORAL
  Filled 2015-03-14 (×3): qty 2

## 2015-03-14 MED ORDER — KETAMINE HCL 50 MG/ML IJ SOLN
INTRAMUSCULAR | Status: DC | PRN
  Administered 2015-03-14: 20 mg via INTRAMUSCULAR

## 2015-03-14 MED ORDER — VANCOMYCIN HCL 1000 MG IV SOLR: 1000.0000 mg | Freq: Once | INTRAVENOUS | Status: DC

## 2015-03-14 MED ORDER — CALCIUM CARBONATE ANTACID 500 MG PO CHEW
2.5000 | CHEWABLE_TABLET | Freq: Two times a day (BID) | ORAL | Status: DC
  Administered 2015-03-14: 500 mg via ORAL
  Filled 2015-03-14: qty 1
  Filled 2015-03-14 (×2): qty 2.5
  Filled 2015-03-14: qty 1

## 2015-03-14 MED ORDER — INSULIN ASPART PROT & ASPART (70-30 MIX) 100 UNIT/ML ~~LOC~~ SUSP
10.0000 [IU] | Freq: Two times a day (BID) | SUBCUTANEOUS | Status: DC
  Administered 2015-03-15: 10 [IU] via SUBCUTANEOUS
  Filled 2015-03-14: qty 10

## 2015-03-14 MED ORDER — VANCOMYCIN HCL IN DEXTROSE 1-5 GM/200ML-% IV SOLN
1000.0000 mg | Freq: Two times a day (BID) | INTRAVENOUS | Status: AC
  Administered 2015-03-14: 1000 mg via INTRAVENOUS
  Filled 2015-03-14: qty 200

## 2015-03-14 MED ORDER — ONDANSETRON HCL 4 MG/2ML IJ SOLN: 4.0000 mg | Freq: Four times a day (QID) | INTRAMUSCULAR | Status: DC | PRN

## 2015-03-14 MED ORDER — FUROSEMIDE 20 MG PO TABS
20.0000 mg | ORAL_TABLET | Freq: Every day | ORAL | Status: DC
Start: 2015-03-14 — End: 2015-03-15
  Administered 2015-03-15: 20 mg via ORAL
  Filled 2015-03-14: qty 1

## 2015-03-14 MED ORDER — FENTANYL CITRATE (PF) 100 MCG/2ML IJ SOLN
INTRAMUSCULAR | Status: DC | PRN
  Administered 2015-03-14: 50 ug via INTRAVENOUS
  Administered 2015-03-14 (×2): 25 ug via INTRAVENOUS

## 2015-03-14 MED ORDER — PRAVASTATIN SODIUM 20 MG PO TABS
40.0000 mg | ORAL_TABLET | Freq: Every day | ORAL | Status: DC
  Administered 2015-03-14: 40 mg via ORAL
  Filled 2015-03-14 (×2): qty 1

## 2015-03-14 MED ORDER — GABAPENTIN 100 MG PO CAPS
100.0000 mg | ORAL_CAPSULE | Freq: Two times a day (BID) | ORAL | Status: DC
  Administered 2015-03-14 – 2015-03-15 (×2): 100 mg via ORAL
  Filled 2015-03-14 (×2): qty 1

## 2015-03-14 MED ORDER — LIDOCAINE 1 % OPTIME INJ - NO CHARGE
INTRAMUSCULAR | Status: DC | PRN
  Administered 2015-03-14: 30 mL

## 2015-03-14 MED ORDER — PANTOPRAZOLE SODIUM 40 MG PO TBEC
40.0000 mg | DELAYED_RELEASE_TABLET | Freq: Every day | ORAL | Status: DC
  Administered 2015-03-15: 40 mg via ORAL
  Filled 2015-03-14: qty 1

## 2015-03-14 MED ORDER — SODIUM CHLORIDE 0.9 % IV SOLN
INTRAVENOUS | Status: DC
  Administered 2015-03-14: 11:00:00 via INTRAVENOUS

## 2015-03-14 MED ORDER — ALENDRONATE SODIUM 10 MG PO TABS: 70.0000 mg | ORAL_TABLET | Freq: Every day | ORAL | Status: DC

## 2015-03-14 MED ORDER — ONDANSETRON HCL 4 MG/2ML IJ SOLN: 4.0000 mg | Freq: Once | INTRAMUSCULAR | Status: DC | PRN

## 2015-03-14 SURGICAL SUPPLY — 32 items
BAG DECANTER FOR FLEXI CONT (MISCELLANEOUS) ×3 IMPLANT
BRUSH SCRUB 4% CHG (MISCELLANEOUS) ×3 IMPLANT
CABLE SURG 12 DISP A/V CHANNEL (MISCELLANEOUS) ×6 IMPLANT
CANISTER SUCT 1200ML W/VALVE (MISCELLANEOUS) ×3 IMPLANT
CHLORAPREP W/TINT 26ML (MISCELLANEOUS) ×3 IMPLANT
COVER LIGHT HANDLE STERIS (MISCELLANEOUS) ×6 IMPLANT
COVER MAYO STAND STRL (DRAPES) ×3 IMPLANT
DRAPE C-ARM XRAY 36X54 (DRAPES) ×3 IMPLANT
DRESSING TELFA 4X3 1S ST N-ADH (GAUZE/BANDAGES/DRESSINGS) ×3 IMPLANT
DRSG TEGADERM 4X4.75 (GAUZE/BANDAGES/DRESSINGS) ×3 IMPLANT
GLOVE BIO SURGEON STRL SZ7.5 (GLOVE) ×3 IMPLANT
GLOVE BIO SURGEON STRL SZ8 (GLOVE) ×3 IMPLANT
GOWN STRL REUS W/ TWL LRG LVL3 (GOWN DISPOSABLE) ×1 IMPLANT
GOWN STRL REUS W/ TWL XL LVL3 (GOWN DISPOSABLE) ×1 IMPLANT
GOWN STRL REUS W/TWL LRG LVL3 (GOWN DISPOSABLE) ×2
GOWN STRL REUS W/TWL XL LVL3 (GOWN DISPOSABLE) ×2
IMMOBILIZER SHDR MD LX WHT (SOFTGOODS) ×3 IMPLANT
IMMOBILIZER SHDR XL LX WHT (SOFTGOODS) IMPLANT
INTRO PACEMKR SHEATH II 7FR (MISCELLANEOUS) ×6
INTRODUCER PACEMKR SHTH II 7FR (MISCELLANEOUS) ×2 IMPLANT
IV NS 500ML (IV SOLUTION) ×2
IV NS 500ML BAXH (IV SOLUTION) ×1 IMPLANT
KIT RM TURNOVER STRD PROC AR (KITS) ×3 IMPLANT
LABEL OR SOLS (LABEL) ×3 IMPLANT
LEAD CAPSURE NOVUS 5076-52CM (Lead) ×3 IMPLANT
MARKER SKIN W/RULER 31145785 (MISCELLANEOUS) ×3 IMPLANT
PACK PACE INSERTION (MISCELLANEOUS) ×3 IMPLANT
PAD GROUND ADULT SPLIT (MISCELLANEOUS) ×3 IMPLANT
PAD STATPAD (MISCELLANEOUS) ×3 IMPLANT
PPM'ADVISA SR MRI A3SR01 (Pacemaker) ×1 IMPLANT
PPMADVISA SR MRI A3SR01 (Pacemaker) ×2 IMPLANT
SUT SILK 0 SH 30 (SUTURE) ×9 IMPLANT

## 2015-03-14 NOTE — Anesthesia Postprocedure Evaluation (Signed)
  Anesthesia Post-op Note  Patient: Amber [name]  Procedure(s) Performed: Procedure(s): INSERTION PACEMAKER (Left)  Anesthesia type:MAC  Patient location: PACU  Post pain: Pain level controlled  Post assessment: Post-op Vital signs reviewed, Patient's Cardiovascular Status Stable, Respiratory Function Stable, Patent Airway and No signs of Nausea or vomiting  Post vital signs: Reviewed and stable  Last Vitals:  Filed Vitals:   03/14/15 1420  BP: 116/72  Pulse: 59  Temp: 36.7 C  Resp: 19    Level of consciousness: awake, alert  and patient cooperative  Complications: No apparent anesthesia complications

## 2015-03-14 NOTE — H&P (Signed)
Progress Notes   Amber [name] (MR# [id number])        Progress Notes Info     Author Note Status Last Update User Last Update Date/Time Service Date   Amber Dibble, MD Signed Amber Dibble, MD Fri Feb 23, 2015 11:26 AM Amber [name] Feb 23, 2015 11:18 AM    Progress Notes    Expand All Collapse All   Established Patient Visit   Chief Complaint: Chief Complaint  Patient presents with  . Follow-up    Myo,holter,echo   Date of Service: 02/23/2015 Date of Birth: [birth year] PCP: Amber Crouch, MD, MD  History of Present Illness: [name] is a 81 y.o.female patient  Sick sinus syndrome The patient has had persistant non valvular atrial fibrillation for months without pacemaker placement with symptoms including dyspnea, exertional chest pressure/discomfort and near-syncope worsening with increased severity and frequency on medications including no meds. The patient has causes and risk factors of atrial fibrillation including hypertension, coronary artery disease and valve disease. The identified symptoms associated with atrial fibrillation have  altered the patient's quality of life. The patient has not been on anticoagulation for further risk reduction of cardiovascular event and/or stroke CHADSVASC The patient currently has Hypertension (1), Age > 72 (2), Diabetes (1) and Sex = female (1) with a resulting score and a stroke risk of 5 and 6.7% respectively HASBLED bleeding risk with anticoagulation The patient has the following risk factors for bleeding complications with anticoagulation Age> 65 The patient has a score of and a bleeding risk of 1 and 3.4% respectively, and a low risk risk of bleeding within the next year Holter The holter monitor shows frequent PVCs, atrial fibrillation and sick sinus syndrome  Stress Test Persantine SESTAMIBI was performed showing Normal test. Coronary artery disease The patient has known coronary artery disease status  post coronary bypass graft currently stable on statin therapy and arb without apparent side effects of treatment and with risk factors including age, postmenopausal female, HTN and Hyperlipidemia without evidence of significant progressive anginal symptoms requiring further intervention at this time Hypertension The patient does have essential hypertension which they are receiving medication management. They have reviewed current treatment medications below and they appear stable. This medical regimen has been tolerated fairly well at this time. It appears that they have been reaching appropriate blood pressure goals and have been educated on the risks and benefits of this medication management. There has also been a discussion of low-sodium diet for improved long-term control. There appears not to be any secondary causes of this condition.   Past Medical and Surgical History  Past Medical History Past Medical History  Diagnosis Date  . Benign neoplasm of colon   . Other and unspecified hyperlipidemia   . Other and unspecified angina pectoris   . Plantar fascial fibromatosis   . Type II or unspecified type diabetes mellitus without mention of complication, not stated as uncontrolled   . Essential hypertension, benign   . Coronary atherosclerosis of autologous vein bypass graft   . Hemorrhage of rectum and anus   . Type II or unspecified type diabetes mellitus without mention of complication, uncontrolled   . Arthritis     Degenerative disc disease in the lumbar spine, OA knees, hips, knees, shoulders  . Obesity   . Varicosities     Lower extremity   . Venous stasis     with peripheral edema.  . History of hepatitis   . ASCVD (arteriosclerotic cardiovascular disease)  status post CABG s/p triple bypass. Positive heart murmur.  . Congestive heart failure   . GERD (gastroesophageal reflux disease)   . Baker's  cyst     Past Surgical History She has past surgical history that includes Hysterectomy; S/P bilateral vein stripping.; S/P hemorrhoidectomy.; S/P bilateral carpal tunnel release. ; S/P lumbar laminectomy in April of 2002.; Right total knee replacement (2004); Right total hip replacement (2006); Left shoulder surgery times two; Coronary artery bypass graft; Left arm fracture.; Cataract extraction (Right, 2007); and Cataract extraction (Left, 2013).   Medications and Allergies  Current Medications   Current Medications    Current Outpatient Prescriptions  Medication Sig Dispense Refill  . alcohol swabs PadM Apply 200 Swab topically 2 (two) times daily. 200 each 3  . alendronate (FOSAMAX) 70 MG tablet Take 1 tablet (70 mg total) by mouth every 7 (seven) days. 12 tablet 3  . ALPRAZolam (XANAX) 1 MG tablet Take 1 tablet (1 mg total) by mouth 2 (two) times daily as needed. 60 tablet 5  . amLODIPine (NORVASC) 10 MG tablet Take 1 tablet (10 mg total) by mouth once daily. (Patient taking differently: Take 5 mg by mouth once daily.  ) 90 tablet 3  . blood glucose contrl hi,normal (ACCU-CHEK COMP BLUE CONT, M-H) Soln Use 1 Dose 2 (two) times daily. 3 each 3  . blood glucose diagnostic (FREESTYLE TEST) test strip Use 1 each 2 (two) times daily. Use as instructed. 100 each 6  . blood-glucose meter (ACCU-CHEK NANO) Misc As directec 1 each 0  . calcium carbonate (CALCIUM 600) 600 mg (1,500 mg) Tab tablet Take 600 mg by mouth 2 (two) times daily.    . ferrous sulfate (IRON) 325 mg (65 mg iron) capsule Take 325 mg by mouth once daily.    . fluticasone-salmeterol (ADVAIR DISKUS) 250-50 mcg/dose diskus inhaler Inhale 1 inhalation into the lungs every 12 (twelve) hours. 1 Inhaler 12  . FUROsemide (LASIX) 20 MG tablet Take 1 tablet (20 mg total) by mouth once daily. 30 tablet 11  . gabapentin (NEURONTIN) 100 MG capsule Take 1 capsule (100 mg  total) by mouth 2 (two) times daily. 180 capsule 3  . insulin ASPART PROTAMINE-ASPART (NOVOLOG MIX FLEXPEN 70/30) 100 unit/mL (70-30) pen injector Inject subcutaneously 2 (two) times daily before meals. 10 units twice daily    . insulin REGULAR (HUMULIN R,NOVOLIN R) 100 unit/mL injection 10u qam and 25u qpm, Novolin R 30 mL 3  . insulin syringe-needle U-100 1/2 mL 30 x 1/2" syringe Use 4 (four) times daily. 400 each 3  . losartan (COZAAR) 100 MG tablet Take 1 tablet (100 mg total) by mouth once daily. 90 tablet 3  . multivitamin with minerals-lutein (VISION FORMULA, WITH LUTEIN,) 1,000 unit-200 mg-60 unit-2 mg Tab tablet Take 1 tablet by mouth once daily.    Marland Kitchen omeprazole (PRILOSEC) 20 MG DR capsule Take 1 capsule (20 mg total) by mouth once daily. 90 capsule 3  . pravastatin (PRAVACHOL) 40 MG tablet Take 1 tablet (40 mg total) by mouth nightly. Take 2 tablets by mouth at bedtime (Patient taking differently: Take 40 mg by mouth once daily.  ) 90 tablet 3  . apixaban (ELIQUIS) 5 mg tablet Take 1 tablet (5 mg total) by mouth 2 (two) times daily. 60 tablet 11   No current facility-administered medications for this visit.      Allergies: Ambien; Etodolac (bulk); Iodine and iodide containing products; Nsaids (non-steroidal anti-inflammatory drug); Other; Penicillins; and Succinylcholine  Social and  Family History  Social History  reports that she has never smoked. She does not have any smokeless tobacco history on file. She reports that she does not drink alcohol.  Family History Family History  Problem Relation Age of Onset  . Heart disease Mother   . Coronary artery disease Mother   . Hypertension Mother   . Heart disease Father   . Osteoarthritis Father   . Hypertension Father   . Osteoporosis Father   . Bone cancer Father   . Colon polyps Son     Review of Systems   Review of Systems   Positive for fall Negative for weight gain weight loss, vision change, hearing loss, cough, congestion, PND, orthopnea, heartburn, nausea, diaphoresis, vomiting, diarrhea, bloody stool, melena, stomach pain, extremity pain, leg weakness, leg cramping, leg blood clots, headache, blackouts, nosebleed, trouble swallowing, mouth pain, urinary frequency, urination at night, muscle weakness, skin lesions, skin rashes, tingling , numbness, anxiety, and/or depression Physical Examination   Vitals:BP 132/66 mmHg  Pulse 71  Ht 162.6 cm (5\' 4" )  Wt 76.658 kg (169 lb)  BMI 28.99 kg/m2  SpO2 94% Ht:162.6 cm (5\' 4" ) Wt:76.658 kg (169 lb) PPJ:KDTO surface area is 1.86 meters squared. Body mass index is 28.99 kg/(m^2). Appearance: well appearing in no acute distress HEENT: Pupils equally reactive to light and accomodation, no apparent xanthalasma  Neck: Supple, no apparent thyromegaly, masses, or lymphadenopathy  Lungs: normal respiratory effort; no crackles, no rhonchi, no wheezes Heart: irtegular rate and rhythm. Normal S1 S2 No gallops, murmur, no rub, PMI is normal size and placement. carotid upstroke normal without bruit. Jugular venous pressure is normal Abdomen: soft, nontender, not distended with normal bowel sounds. No apparent hepatosplenomegally. Abdominal aorta is normal size without bruit Extremities: no edema, no ulcers, no clubbing, no cyanosis Peripheral Pulses: 2+ in upper extremities, 1+ femoral pulses bilaterally, 1+lower extremity  Musculoskeletal; Normal muscle tone without kyphosis Neurological: Cranial nerves intact   Assessment   81 y.o. female with  Encounter Diagnoses  Name Primary?  . Paroxysmal a-fib   . SSS (sick sinus syndrome) Yes  . Coronary artery disease involving coronary bypass graft of native heart without angina pectoris   . Benign essential hypertension         Plan  -apixaban for anticoagulation and further risk  reduction of stroke with atrial fibrillation/flutter. Patient understands all risks and benefits of this medication management including the possibility of side effects including bleeding, bruising, stroke, and interactions with other medication management and agrees  -The patient is to have consultation and permanent pacemaker placement for sick sinus syndrome. The patient understands all risks and benefits of permanent pacemaker placement. This includes the possibility of death, stroke, heart attack, hemopericardium, pneumothorax, infection, bleeding, blood clot, and reaction to medications. The patient is at low risk for general anesthesia      Orders Placed This Encounter  Procedures  . ECG 12-lead    Return in about 4 weeks (around 03/23/2015).  Amber [name], [facility]   [address] [place] [address]    Service Location    Name Address       [place] [address]      Department    Name Address Phone Fax   [facility] [place] [address] [phone]

## 2015-03-14 NOTE — Anesthesia Preprocedure Evaluation (Addendum)
Anesthesia Evaluation  Patient identified by MRN, date of birth, ID band Patient awake    Reviewed: Allergy & Precautions, NPO status , Patient's Chart, lab work & pertinent test results, reviewed documented beta blocker date and time   History of Anesthesia Complications (+) history of anesthetic complications  Airway Mallampati: II  TM Distance: >3 FB     Dental  (+) Chipped, Poor Dentition, Missing, Dental Advisory Given   Pulmonary           Cardiovascular hypertension, Pt. on medications + angina + CAD and +CHF       Neuro/Psych Anxiety    GI/Hepatic   Endo/Other  diabetes  Renal/GU      Musculoskeletal  (+) Arthritis , Osteoarthritis,    Abdominal   Peds  Hematology  (+) anemia ,   Anesthesia Other Findings Pt sensitive to succinylcholine. Was on a breathing tube for a long time after a hemorrhoidectomy.  Reproductive/Obstetrics                            Anesthesia Physical Anesthesia Plan  ASA: III  Anesthesia Plan: MAC   Post-op Pain Management:    Induction:   Airway Management Planned:   Additional Equipment:   Intra-op Plan:   Post-operative Plan:   Informed Consent: I have reviewed the patients History and Physical, chart, labs and discussed the procedure including the risks, benefits and alternatives for the proposed anesthesia with the patient or authorized representative who has indicated his/her understanding and acceptance.     Plan Discussed with: CRNA  Anesthesia Plan Comments:         Anesthesia Quick Evaluation

## 2015-03-14 NOTE — Interval H&P Note (Signed)
History and Physical Interval Note:  03/14/2015 8:40 AM  [place]  has presented today for surgery, with the diagnosis of BRADYCARDIA  The various methods of treatment have been discussed with the patient and family. After consideration of risks, benefits and other options for treatment, the patient has consented to  Procedure(s): INSERTION PACEMAKER (N/A) as a surgical intervention .  The patient's history has been reviewed, patient examined, no change in status, stable for surgery.  I have reviewed the patient's chart and labs.  Questions were answered to the patient's satisfaction.     Amber [name]

## 2015-03-14 NOTE — Progress Notes (Signed)
MD notified patient to receive 25U Novolog tonight. However, patient's CBG 146. Order obtained to hold tonight's dose

## 2015-03-14 NOTE — Transfer of Care (Signed)
Immediate Anesthesia Transfer of Care Note  Patient: Amber [name]  Procedure(s) Performed: Procedure(s): INSERTION PACEMAKER (Left)  Patient Location: PACU  Anesthesia Type:General  Level of Consciousness: awake, alert , oriented and patient cooperative  Airway & Oxygen Therapy: Patient Spontanous Breathing and Patient connected to nasal cannula oxygen  Post-op Assessment: Report given to RN and Post -op Vital signs reviewed and stable  Post vital signs: Reviewed and stable  Last Vitals:  Filed Vitals:   03/14/15 1334  BP: 110/86  Pulse:   Temp: 36.8 C  Resp: 22    Complications: No apparent anesthesia complications

## 2015-03-14 NOTE — Op Note (Signed)
[facility]   03/14/2015                     1:44 PM  PATIENT:  Amber [name]    PRE-OPERATIVE DIAGNOSIS:  BRADYCARDIA  POST-OPERATIVE DIAGNOSIS:  Same  PROCEDURE:  INSERTION PACEMAKER  SURGEON:  Isaias Cowman, MD    ANESTHESIA:     PREOPERATIVE INDICATIONS:  [name] is a  81 y.o. female with a diagnosis of BRADYCARDIA who failed conservative measures and elected for surgical management.    The risks benefits and alternatives were discussed with the patient preoperatively including but not limited to the risks of infection, bleeding, cardiopulmonary complications, the need for revision surgery, among others, and the patient was willing to proceed.   OPERATIVE PROCEDURE: The patient was brought to the operating room in a fasting state.  The left pectoral region was prepped and draped in the usual sterile manner.  Local anesthesia was obtained with 1% Xylocaine.  6 cm incision was performed over the left pectoral region.  The pacemaker pocket was generated by electrocautery and blunt dissection.  Access was obtained to the left subclavian vein by fine needle aspiration.  MRI compatible lead was present position into the right ventricular apical septum.  After proper thresholds were obtained the lead was sutured in place.  The pacemaker pocket was irrigated with gentamicin solution.  The lead was connected to a single chamber rate responsive MRI compatible pacemaker (Medtronic Advisa SR MRI ).  The pocket was closed with 2-0 and 4-0 Vicryl, respectively.  Steri-Strips and pressure dressing were applied.

## 2015-03-15 DIAGNOSIS — I495 Sick sinus syndrome: Secondary | ICD-10-CM | POA: Diagnosis not present

## 2015-03-15 DIAGNOSIS — Z951 Presence of aortocoronary bypass graft: Secondary | ICD-10-CM | POA: Diagnosis not present

## 2015-03-15 DIAGNOSIS — Z86010 Personal history of colonic polyps: Secondary | ICD-10-CM | POA: Diagnosis not present

## 2015-03-15 DIAGNOSIS — I481 Persistent atrial fibrillation: Secondary | ICD-10-CM | POA: Diagnosis not present

## 2015-03-15 DIAGNOSIS — I10 Essential (primary) hypertension: Secondary | ICD-10-CM | POA: Diagnosis not present

## 2015-03-15 DIAGNOSIS — Z7901 Long term (current) use of anticoagulants: Secondary | ICD-10-CM | POA: Diagnosis not present

## 2015-03-15 DIAGNOSIS — E785 Hyperlipidemia, unspecified: Secondary | ICD-10-CM | POA: Diagnosis not present

## 2015-03-15 DIAGNOSIS — E119 Type 2 diabetes mellitus without complications: Secondary | ICD-10-CM | POA: Diagnosis not present

## 2015-03-15 DIAGNOSIS — R001 Bradycardia, unspecified: Secondary | ICD-10-CM | POA: Diagnosis not present

## 2015-03-15 LAB — GLUCOSE, CAPILLARY
GLUCOSE-CAPILLARY: 134 mg/dL — AB (ref 65–99)
GLUCOSE-CAPILLARY: 143 mg/dL — AB (ref 65–99)

## 2015-03-15 MED ORDER — CLARITHROMYCIN 250 MG PO TABS: 250.0000 mg | ORAL_TABLET | Freq: Two times a day (BID) | ORAL | Status: DC

## 2015-03-15 NOTE — Care Management Note (Signed)
Case Management Note  Patient Details  Name: Amber [name] MRN: [id number] Date of Birth: [birth year]  Subjective/Objective:     Spoke to pt. At length and determined she lives at home  With  Family. She states she uses a cane to get around, and her finances are tight . She also tells Probation officer she gets groceries for herself at a    [employer].For meat she frequents [place] "[place] [address]  There is no problem with getting her medicines per the pt. She states she has no needs at this time, and is excited to discharge sometime today.           Action/Plan:   Expected Discharge Date:                  Expected Discharge Plan:     In-House Referral:     Discharge planning Services     Post Acute Care Choice:    Choice offered to:     DME Arranged:    DME Agency:     HH Arranged:    [place] Agency:     Status of Service:     Medicare Important Message Given:    Date Medicare IM Given:    Medicare IM give by:    Date Additional Medicare IM Given:    Additional Medicare Important Message give by:     If discussed at [place] of Stay Meetings, dates discussed:    Additional Comments:  Beau Fanny, RN 03/15/2015, 9:39 AM

## 2015-03-15 NOTE — Progress Notes (Signed)
Discharge instructions along with home medication list and follow up gone over with patient. Patient verbalized that she understood instruction. After care pacemaker info given to patient as well. One printed rx given to patient. No c/o pain. Steri strips to left chest dry and intact with old drainage. Patient waiting on ride to be discharged home. Will remove telemetry and iv once ride is available [employer]

## 2015-03-15 NOTE — Discharge Instructions (Signed)
1. Do not lift left arm above head 2. Resume Eliquis 03/16/15 3. May shower 03/16/15

## 2015-03-20 DIAGNOSIS — Z950 Presence of cardiac pacemaker: Secondary | ICD-10-CM | POA: Diagnosis not present

## 2015-03-20 DIAGNOSIS — I10 Essential (primary) hypertension: Secondary | ICD-10-CM | POA: Diagnosis not present

## 2015-03-20 DIAGNOSIS — I25810 Atherosclerosis of coronary artery bypass graft(s) without angina pectoris: Secondary | ICD-10-CM | POA: Diagnosis not present

## 2015-03-23 DIAGNOSIS — E782 Mixed hyperlipidemia: Secondary | ICD-10-CM | POA: Diagnosis not present

## 2015-03-23 DIAGNOSIS — D649 Anemia, unspecified: Secondary | ICD-10-CM | POA: Diagnosis not present

## 2015-03-23 DIAGNOSIS — I2510 Atherosclerotic heart disease of native coronary artery without angina pectoris: Secondary | ICD-10-CM | POA: Diagnosis not present

## 2015-03-23 DIAGNOSIS — G894 Chronic pain syndrome: Secondary | ICD-10-CM | POA: Diagnosis not present

## 2015-03-25 ENCOUNTER — Emergency Department
Admission: EM | Admit: 2015-03-25 | Discharge: 2015-03-25 | Disposition: A | Discharge status: Home / Self Care | Payer: Commercial Managed Care - HMO | Attending: Emergency Medicine | Admitting: Emergency Medicine

## 2015-03-25 DIAGNOSIS — I10 Essential (primary) hypertension: Secondary | ICD-10-CM | POA: Diagnosis not present

## 2015-03-25 DIAGNOSIS — Y831 Surgical operation with implant of artificial internal device as the cause of abnormal reaction of the patient, or of later complication, without mention of misadventure at the time of the procedure: Secondary | ICD-10-CM | POA: Diagnosis not present

## 2015-03-25 DIAGNOSIS — T82897A Other specified complication of cardiac prosthetic devices, implants and grafts, initial encounter: Secondary | ICD-10-CM | POA: Insufficient documentation

## 2015-03-25 DIAGNOSIS — E119 Type 2 diabetes mellitus without complications: Secondary | ICD-10-CM | POA: Diagnosis not present

## 2015-03-25 DIAGNOSIS — Z880 Allergy status to penicillin: Secondary | ICD-10-CM | POA: Diagnosis not present

## 2015-03-25 DIAGNOSIS — Z79899 Other long term (current) drug therapy: Secondary | ICD-10-CM | POA: Diagnosis not present

## 2015-03-25 DIAGNOSIS — T792XXA Traumatic secondary and recurrent hemorrhage and seroma, initial encounter: Secondary | ICD-10-CM

## 2015-03-25 DIAGNOSIS — Z792 Long term (current) use of antibiotics: Secondary | ICD-10-CM | POA: Diagnosis not present

## 2015-03-25 DIAGNOSIS — IMO0001 Reserved for inherently not codable concepts without codable children: Secondary | ICD-10-CM

## 2015-03-25 DIAGNOSIS — T888XXA Other specified complications of surgical and medical care, not elsewhere classified, initial encounter: Secondary | ICD-10-CM | POA: Diagnosis not present

## 2015-03-25 DIAGNOSIS — Z794 Long term (current) use of insulin: Secondary | ICD-10-CM | POA: Diagnosis not present

## 2015-03-25 NOTE — Discharge Instructions (Signed)
Seroma A seroma is a collection of fluid that looks like swelling or a mass on the body. Seromas form on the body where tissue has been injured or cut. They are most common after surgeries. Seromas vary in size. Some are small and painless. Others may become large and cause pain or discomfort. Many seromas go away on their own; the fluid is naturally absorbed by the body. Some may require the fluid to be drained through medical procedures.  CAUSES  Seromas form as the result of damage to tissue or the removal of tissue. This tissue damage may occur during surgery or because of an injury or trauma. When tissue is disrupted or removed, empty space is created. The body's natural defense system causes fluid to enter the empty space and form a seroma. SYMPTOMS   Swelling at the site of a surgical cut (incision) or an injury.  Drainage of clear fluid at the surgery or injury site.  Possible discomfort or pain. DIAGNOSIS  Your health care provider will perform a physical exam. During the exam, the health care provider will press on the seroma using a hand or fingers (palpation). Various tests may be ordered to help confirm the diagnosis. These tests may include:  Blood tests.  Imaging tests such as ultrasonography or computed tomography (CT). TREATMENT  Sometimes seromas resolve on their own and drain naturally in the body. Your health care provider may monitor you to make sure the seroma does not cause any complications. If your seroma does not resolve on its own, treatment may include:  Using a needle to drain the fluid from the seroma (needle aspiration).  Inserting a flexible tube (catheter) to drain the fluid.  Applying a dressing, such as an elastic bandage or binder.  Use of antibiotic medicines if the seroma becomes infected.  In rare cases, surgery may be done to remove the seroma and repair the area. HOME CARE INSTRUCTIONS  Follow your health care provider's instructions regarding  activity levels and any limitations on movements.  Only take over-the-counter or prescription medicines as directed by your health care provider.  If your health care provider prescribes antibiotics, take them as directed. Finish them even if you start to feel better.  Check your seroma every day for redness, warmth, or yellow drainage.  Follow up with your health care provider as directed. SEEK MEDICAL CARE IF:  You develop a fever.  You have pain, tenderness, redness, or warmth at the site of the seroma.  You notice yellow drainage coming from the site of the seroma.  Your seroma is getting bigger. Document Released: 10/18/2012 Document Revised: 11/07/2013 Document Reviewed: 10/18/2012 ExitCare Patient Information 2015 [employer]. This information is not intended to replace advice given to you by your health care provider. Make sure you discuss any questions you have with your health care provider.  

## 2015-03-25 NOTE — ED Notes (Signed)
Pt reports she had a pacemaker placed on 03/14/15. Pt reports seen by Dr Nehemiah Massed this past Tuesday and told everything looked good and that she could go back to her normal activity. Pt reports on Thursday she noticed some swelling around the pacemaker insertion site. Friday night she noticed her heart felt like it was fluttering. Tonight she felt like it was swollen worse and felt a little warm.

## 2015-03-25 NOTE — ED Notes (Signed)
Spoke with Dr Joni Fears about pt. Only wants EKG at this time.

## 2015-03-25 NOTE — ED Provider Notes (Signed)
[facility] Emergency Department Provider Note  ____________________________________________  Time seen: 2:15 AM  I have reviewed the triage vital signs and the nursing notes.   HISTORY  Chief Complaint Post-op Problem and Palpitations    HPI Amber [name] is a 81 y.o. female who had a pacemaker placed in the left chest wall about a week and a half ago by Dr. Saralyn Pilar. She follows up with Dr. Nehemiah Massed with cardiology and saw him this week about 5 days ago.Since then, she's had 2 episodes where she felt like she was having some fluttering or palpitations quickly resolved. They lasted only a few seconds. She did not have any associated chest pain shortness breath dizziness or syncope. No symptoms today. Over the last 2 days she is also noticed some swelling in the area of the pacemaker device, but denies any pain in the area or fever. No drainage.     Past Medical History  Diagnosis Date  . Hyperlipidemia   . Anemia   . Chronic pain   . Diabetes mellitus without complication   . Essential hypertension   . Cough   . Anxiety   . High risk medication use   . Benign neoplasm of colon   . Coronary artery disease with unspecified angina pectoris   . Plantar fascial fibromatosis   . Complication of anesthesia 1980's    hard time waking up  . Arthritis   . CHF (congestive heart failure)      Patient Active Problem List   Diagnosis Date Noted  . Sick sinus syndrome 03/14/2015  . Bradycardia 02/16/2015  . Chronic heart failure 01/05/2015  . Fracture dislocation of ankle 07/27/2014  . IDDM (insulin dependent diabetes mellitus) 07/27/2014  . Essential hypertension 07/27/2014  . Multiple rib fractures 07/27/2014  . 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     Past Surgical History  Procedure Laterality Date  . Total knee arthroplasty Right   . Orif ankle fracture Right 07/27/2014    Procedure: OPEN REDUCTION INTERNAL FIXATION (ORIF) ANKLE FRACTURE;   Surgeon: Alta Corning, MD;  Location: [facility];  Service: Orthopedics;  Laterality: Right;  . Abdominal hysterectomy    . Coronary artery bypass graft  2008  . Total hip arthroplasty Right   . Rotator cuff repair    . Vein ligation and stripping    . Back surgery  1960's    cage and screws in lower back  . Joint replacement Right     hip and knee  . Cataract extraction w/ intraocular lens  implant, bilateral Bilateral   . Pacemaker insertion Left 03/14/2015    Procedure: INSERTION PACEMAKER;  Surgeon: Isaias Cowman, MD;  Location: [facility];  Service: Cardiovascular;  Laterality: Left;     Current Outpatient Rx  Name  Route  Sig  Dispense  Refill  . alendronate (FOSAMAX) 70 MG tablet   Oral   Take 70 mg by mouth once a week. Pt takes on Thursday.         . ALPRAZolam (XANAX) 1 MG tablet   Oral   Take 1 tablet (1 mg total) by mouth at bedtime as needed for anxiety.   30 tablet   0   . amLODipine (NORVASC) 10 MG tablet   Oral   Take 5 mg by mouth daily.         Marland Kitchen apixaban (ELIQUIS) 5 MG TABS tablet   Oral   Take 5 mg by mouth 2 (two) times daily.         Marland Kitchen  calcium citrate-vitamin D (CITRACAL+D) 315-200 MG-UNIT per tablet   Oral   Take 1 tablet by mouth 2 (two) times daily.         . clarithromycin (BIAXIN) 250 MG tablet   Oral   Take 1 tablet (250 mg total) by mouth 2 (two) times daily.   14 tablet   0   . ferrous fumarate (HEMOCYTE - 106 MG FE) 325 (106 FE) MG TABS tablet   Oral   Take 1 tablet by mouth daily.         . Fluticasone-Salmeterol (ADVAIR) 250-50 MCG/DOSE AEPB   Inhalation   Inhale 1 puff into the lungs 2 (two) times daily.         . furosemide (LASIX) 20 MG tablet   Oral   Take 1 tablet (20 mg total) by mouth daily.   10 tablet   0   . gabapentin (NEURONTIN) 100 MG capsule   Oral   Take 100 mg by mouth 2 (two) times daily.         . insulin regular (NOVOLIN R,HUMULIN R) 100 units/mL injection   Subcutaneous   Inject 10-25  Units into the skin 2 (two) times daily. Pt uses 10 units in the morning and 25 units at bedtime.         Marland Kitchen losartan (COZAAR) 100 MG tablet   Oral   Take 100 mg by mouth daily.         . Multiple Vitamins-Minerals (PRESERVISION AREDS 2) CAPS   Oral   Take 1 capsule by mouth daily.         Marland Kitchen omeprazole (PRILOSEC) 20 MG capsule   Oral   Take 20 mg by mouth daily.         Marland Kitchen oxyCODONE-acetaminophen (PERCOCET/ROXICET) 5-325 MG per tablet   Oral   Take 1 tablet by mouth every 6 (six) hours as needed for moderate pain.   30 tablet   0   . pravastatin (PRAVACHOL) 40 MG tablet   Oral   Take 40 mg by mouth at bedtime.            Allergies Ambien; Etodolac; Iodine; Nsaids; Penicillins; and Succinylcholine   Family History  Problem Relation Age of Onset  . Heart attack Mother   . Heart disease Father   . Alzheimer's disease Sister   . Cervical cancer Sister   . Heart failure Son     Social History Social History  Substance Use Topics  . Smoking status: Never Smoker   . Smokeless tobacco: Never Used  . Alcohol Use: No    Review of Systems  Constitutional:   No fever or chills. No weight changes Eyes:   No blurry vision or double vision.  ENT:   No sore throat. Cardiovascular:   No chest pain. Respiratory:   No dyspnea or cough. Gastrointestinal:   Negative for abdominal pain, vomiting and diarrhea.  No BRBPR or melena. Genitourinary:   Negative for dysuria, urinary retention, bloody urine, or difficulty urinating. Musculoskeletal:   Negative for back pain. No joint swelling or pain. Skin:   Negative for rash. Neurological:   Negative for headaches, focal weakness or numbness. Psychiatric:  No anxiety or depression.   Endocrine:  No hot/cold intolerance, changes in energy, or sleep difficulty.  10-point ROS otherwise negative.  ____________________________________________   PHYSICAL EXAM:  VITAL SIGNS: ED Triage Vitals  Enc Vitals Group     BP  03/25/15 0018 170/81 mmHg     Pulse  Rate 03/25/15 0018 66     Resp 03/25/15 0018 18     Temp 03/25/15 0018 98 F (36.7 C)     Temp Source 03/25/15 0018 Oral     SpO2 03/25/15 0018 97 %     Weight 03/25/15 0018 159 lb (72.122 kg)     Height 03/25/15 0018 5\' 1"  (1.549 m)     Head Cir --      Peak Flow --      Pain Score 03/25/15 0020 1     Pain Loc --      Pain Edu? --      Excl. in [place]? --      Constitutional:   Alert and oriented. Well appearing and in no distress. Eyes:   No scleral icterus. No conjunctival pallor. PERRL. EOMI ENT   Head:   Normocephalic and atraumatic.   Nose:   No congestion/rhinnorhea. No septal hematoma   Mouth/Throat:   MMM, no pharyngeal erythema. No peritonsillar mass. No uvula shift.   Neck:   No stridor. No SubQ emphysema. No meningismus. Hematological/Lymphatic/Immunilogical:   No cervical lymphadenopathy. Cardiovascular:   RRR. Normal and symmetric distal pulses are present in all extremities. No murmurs, rubs, or gallops. Left chest wall reveals in an area in the superior anterior region where there is recently a pacemaker placed. The incision is completely sealed and healing well. There is no open area or dehiscence or drainage through the incision site. There is no warmth or induration tenderness or erythema in the area. There is very soft swelling and possible fluctuance in the area of the pacemaker, but no pain or tenderness with manipulation of the area.  Respiratory:   Normal respiratory effort without tachypnea nor retractions. Breath sounds are clear and equal bilaterally. No wheezes/rales/rhonchi. Gastrointestinal:   Soft and nontender. No distention. There is no CVA tenderness.  No rebound, rigidity, or guarding. Genitourinary:   deferred Musculoskeletal:   Nontender with normal range of motion in all extremities. No joint effusions.  No lower extremity tenderness.  No edema. Neurologic:   Normal speech and language.  CN 2-10  normal. Motor grossly intact. No pronator drift.  Normal gait. No gross focal neurologic deficits are appreciated.  Skin:    Skin is warm, dry and intact. No rash noted.  No petechiae, purpura, or bullae. Psychiatric:   Mood and affect are normal. Speech and behavior are normal. Patient exhibits appropriate insight and judgment.  ____________________________________________    LABS (pertinent positives/negatives) (all labs ordered are listed, but only abnormal results are displayed) Labs Reviewed - No data to display ____________________________________________   EKG  Interpreted by me  Chart ventricular pacemaker. Rate is 63. Left axis, left bundle branch block. No Sgarbossa criteria. No ectopy.  ____________________________________________    RADIOLOGY    ____________________________________________   PROCEDURES Limited bedside ultrasound of the chest wall soft tissues performed by me. As able to visualize the anterior most portion of the device. In that area, immediately overlying the device there is an anechoic area consistent with of fluid collection. There is no cobblestoning or hyperechoic area surrounding it to suggest inflammation. Total estimated volume of the fluid collection is 5-10 mL.  ____________________________________________   INITIAL IMPRESSION / ASSESSMENT AND PLAN / ED COURSE  Pertinent labs & imaging results that were available during my care of the patient were reviewed by me and considered in my medical decision making (see chart for details).  Patient presents with swelling at pacemaker site there was  recently inserted. No evidence of infection in the area. Have low suspicion for cellulitis or a pacemaker abscess. It appears that she has a seroma in the area. Due to the recent insertion and his critical indwelling device, I don't think he would be prudent to try and aspirate it at this time. I'll have her follow-up with her cardiology team for  further monitoring of the symptoms. She was taking prophylactic antibiotics following the insertion procedure which she finished yesterday. No further antibiotics are indicated at this time. Patient advised to call Dr. Alveria Apley clinic to follow-up this week.     ____________________________________________   FINAL CLINICAL IMPRESSION(S) / ED DIAGNOSES  Final diagnoses:  Seroma complicating a procedure, initial encounter   on left chest wall around pacemaker site.    Carrie Mew, MD 03/25/15 [phone]

## 2015-04-22 DIAGNOSIS — D649 Anemia, unspecified: Secondary | ICD-10-CM | POA: Diagnosis not present

## 2015-04-22 DIAGNOSIS — I2510 Atherosclerotic heart disease of native coronary artery without angina pectoris: Secondary | ICD-10-CM | POA: Diagnosis not present

## 2015-04-22 DIAGNOSIS — E782 Mixed hyperlipidemia: Secondary | ICD-10-CM | POA: Diagnosis not present

## 2015-04-22 DIAGNOSIS — G894 Chronic pain syndrome: Secondary | ICD-10-CM | POA: Diagnosis not present

## 2015-05-01 DIAGNOSIS — F411 Generalized anxiety disorder: Secondary | ICD-10-CM | POA: Diagnosis not present

## 2015-05-01 DIAGNOSIS — Z794 Long term (current) use of insulin: Secondary | ICD-10-CM | POA: Diagnosis not present

## 2015-05-01 DIAGNOSIS — G629 Polyneuropathy, unspecified: Secondary | ICD-10-CM | POA: Diagnosis not present

## 2015-05-01 DIAGNOSIS — Z79899 Other long term (current) drug therapy: Secondary | ICD-10-CM | POA: Diagnosis not present

## 2015-05-01 DIAGNOSIS — G894 Chronic pain syndrome: Secondary | ICD-10-CM | POA: Diagnosis not present

## 2015-05-01 DIAGNOSIS — I10 Essential (primary) hypertension: Secondary | ICD-10-CM | POA: Diagnosis not present

## 2015-05-01 DIAGNOSIS — E782 Mixed hyperlipidemia: Secondary | ICD-10-CM | POA: Diagnosis not present

## 2015-05-01 DIAGNOSIS — E119 Type 2 diabetes mellitus without complications: Secondary | ICD-10-CM | POA: Diagnosis not present

## 2015-05-16 DIAGNOSIS — Z23 Encounter for immunization: Secondary | ICD-10-CM | POA: Diagnosis not present

## 2015-05-17 DIAGNOSIS — I495 Sick sinus syndrome: Secondary | ICD-10-CM | POA: Diagnosis not present

## 2015-05-21 ENCOUNTER — Ambulatory Visit: Payer: Commercial Managed Care - HMO | Admitting: Family

## 2015-05-23 DIAGNOSIS — E782 Mixed hyperlipidemia: Secondary | ICD-10-CM | POA: Diagnosis not present

## 2015-05-23 DIAGNOSIS — I2510 Atherosclerotic heart disease of native coronary artery without angina pectoris: Secondary | ICD-10-CM | POA: Diagnosis not present

## 2015-05-23 DIAGNOSIS — D649 Anemia, unspecified: Secondary | ICD-10-CM | POA: Diagnosis not present

## 2015-05-23 DIAGNOSIS — G894 Chronic pain syndrome: Secondary | ICD-10-CM | POA: Diagnosis not present

## 2015-06-07 DIAGNOSIS — G894 Chronic pain syndrome: Secondary | ICD-10-CM | POA: Diagnosis not present

## 2015-06-21 DIAGNOSIS — E119 Type 2 diabetes mellitus without complications: Secondary | ICD-10-CM | POA: Diagnosis not present

## 2015-06-22 DIAGNOSIS — D649 Anemia, unspecified: Secondary | ICD-10-CM | POA: Diagnosis not present

## 2015-06-22 DIAGNOSIS — I2510 Atherosclerotic heart disease of native coronary artery without angina pectoris: Secondary | ICD-10-CM | POA: Diagnosis not present

## 2015-06-22 DIAGNOSIS — G894 Chronic pain syndrome: Secondary | ICD-10-CM | POA: Diagnosis not present

## 2015-06-22 DIAGNOSIS — E782 Mixed hyperlipidemia: Secondary | ICD-10-CM | POA: Diagnosis not present

## 2015-07-23 DIAGNOSIS — I2510 Atherosclerotic heart disease of native coronary artery without angina pectoris: Secondary | ICD-10-CM | POA: Diagnosis not present

## 2015-07-23 DIAGNOSIS — D649 Anemia, unspecified: Secondary | ICD-10-CM | POA: Diagnosis not present

## 2015-07-23 DIAGNOSIS — G894 Chronic pain syndrome: Secondary | ICD-10-CM | POA: Diagnosis not present

## 2015-07-23 DIAGNOSIS — E782 Mixed hyperlipidemia: Secondary | ICD-10-CM | POA: Diagnosis not present

## 2015-07-25 ENCOUNTER — Observation Stay
Admission: EM | Admit: 2015-07-25 | Discharge: 2015-07-26 | Disposition: A | Discharge status: Home / Self Care | Payer: Commercial Managed Care - HMO | Attending: Internal Medicine | Admitting: Internal Medicine

## 2015-07-25 ENCOUNTER — Encounter: Payer: Self-pay | Admitting: Emergency Medicine

## 2015-07-25 ENCOUNTER — Emergency Department: Payer: Commercial Managed Care - HMO

## 2015-07-25 DIAGNOSIS — Z888 Allergy status to other drugs, medicaments and biological substances status: Secondary | ICD-10-CM | POA: Diagnosis not present

## 2015-07-25 DIAGNOSIS — I739 Peripheral vascular disease, unspecified: Secondary | ICD-10-CM | POA: Insufficient documentation

## 2015-07-25 DIAGNOSIS — R079 Chest pain, unspecified: Secondary | ICD-10-CM | POA: Diagnosis not present

## 2015-07-25 DIAGNOSIS — R29810 Facial weakness: Secondary | ICD-10-CM | POA: Insufficient documentation

## 2015-07-25 DIAGNOSIS — M1991 Primary osteoarthritis, unspecified site: Secondary | ICD-10-CM | POA: Insufficient documentation

## 2015-07-25 DIAGNOSIS — Z950 Presence of cardiac pacemaker: Secondary | ICD-10-CM | POA: Diagnosis not present

## 2015-07-25 DIAGNOSIS — R4781 Slurred speech: Secondary | ICD-10-CM | POA: Diagnosis not present

## 2015-07-25 DIAGNOSIS — Z794 Long term (current) use of insulin: Secondary | ICD-10-CM | POA: Diagnosis not present

## 2015-07-25 DIAGNOSIS — Z886 Allergy status to analgesic agent status: Secondary | ICD-10-CM | POA: Diagnosis not present

## 2015-07-25 DIAGNOSIS — E1122 Type 2 diabetes mellitus with diabetic chronic kidney disease: Secondary | ICD-10-CM | POA: Diagnosis not present

## 2015-07-25 DIAGNOSIS — I10 Essential (primary) hypertension: Secondary | ICD-10-CM | POA: Diagnosis not present

## 2015-07-25 DIAGNOSIS — N189 Chronic kidney disease, unspecified: Secondary | ICD-10-CM | POA: Diagnosis not present

## 2015-07-25 DIAGNOSIS — G8929 Other chronic pain: Secondary | ICD-10-CM | POA: Diagnosis not present

## 2015-07-25 DIAGNOSIS — E785 Hyperlipidemia, unspecified: Secondary | ICD-10-CM | POA: Insufficient documentation

## 2015-07-25 DIAGNOSIS — I129 Hypertensive chronic kidney disease with stage 1 through stage 4 chronic kidney disease, or unspecified chronic kidney disease: Secondary | ICD-10-CM | POA: Insufficient documentation

## 2015-07-25 DIAGNOSIS — Z7901 Long term (current) use of anticoagulants: Secondary | ICD-10-CM | POA: Diagnosis not present

## 2015-07-25 DIAGNOSIS — I4891 Unspecified atrial fibrillation: Secondary | ICD-10-CM | POA: Insufficient documentation

## 2015-07-25 DIAGNOSIS — G459 Transient cerebral ischemic attack, unspecified: Principal | ICD-10-CM | POA: Diagnosis present

## 2015-07-25 DIAGNOSIS — I2510 Atherosclerotic heart disease of native coronary artery without angina pectoris: Secondary | ICD-10-CM | POA: Insufficient documentation

## 2015-07-25 DIAGNOSIS — Z951 Presence of aortocoronary bypass graft: Secondary | ICD-10-CM | POA: Insufficient documentation

## 2015-07-25 DIAGNOSIS — I639 Cerebral infarction, unspecified: Secondary | ICD-10-CM | POA: Diagnosis present

## 2015-07-25 DIAGNOSIS — I6350 Cerebral infarction due to unspecified occlusion or stenosis of unspecified cerebral artery: Secondary | ICD-10-CM | POA: Diagnosis not present

## 2015-07-25 DIAGNOSIS — Z91048 Other nonmedicinal substance allergy status: Secondary | ICD-10-CM | POA: Insufficient documentation

## 2015-07-25 DIAGNOSIS — I509 Heart failure, unspecified: Secondary | ICD-10-CM | POA: Diagnosis not present

## 2015-07-25 DIAGNOSIS — Z880 Allergy status to penicillin: Secondary | ICD-10-CM | POA: Insufficient documentation

## 2015-07-25 DIAGNOSIS — Z66 Do not resuscitate: Secondary | ICD-10-CM | POA: Insufficient documentation

## 2015-07-25 DIAGNOSIS — E119 Type 2 diabetes mellitus without complications: Secondary | ICD-10-CM | POA: Diagnosis not present

## 2015-07-25 DIAGNOSIS — I495 Sick sinus syndrome: Secondary | ICD-10-CM | POA: Diagnosis not present

## 2015-07-25 DIAGNOSIS — K219 Gastro-esophageal reflux disease without esophagitis: Secondary | ICD-10-CM | POA: Insufficient documentation

## 2015-07-25 DIAGNOSIS — IMO0002 Reserved for concepts with insufficient information to code with codable children: Secondary | ICD-10-CM

## 2015-07-25 DIAGNOSIS — I69392 Facial weakness following cerebral infarction: Secondary | ICD-10-CM | POA: Diagnosis present

## 2015-07-25 HISTORY — DX: Chronic kidney disease, unspecified: N18.9

## 2015-07-25 HISTORY — DX: Presence of cardiac pacemaker: Z95.0

## 2015-07-25 HISTORY — DX: Reserved for inherently not codable concepts without codable children: IMO0001

## 2015-07-25 LAB — COMPREHENSIVE METABOLIC PANEL
ALK PHOS: 58 U/L (ref 38–126)
ALT: 15 U/L (ref 14–54)
AST: 19 U/L (ref 15–41)
Albumin: 4.3 g/dL (ref 3.5–5.0)
Anion gap: 8 (ref 5–15)
BUN: 26 mg/dL — AB (ref 6–20)
CALCIUM: 9.7 mg/dL (ref 8.9–10.3)
CHLORIDE: 101 mmol/L (ref 101–111)
CO2: 27 mmol/L (ref 22–32)
CREATININE: 1.05 mg/dL — AB (ref 0.44–1.00)
GFR, EST AFRICAN AMERICAN: 55 mL/min — AB (ref >60)
GFR, EST NON AFRICAN AMERICAN: 48 mL/min — AB (ref >60)
Glucose, Bld: 149 mg/dL — ABNORMAL HIGH (ref 65–99)
Potassium: 4 mmol/L (ref 3.5–5.1)
SODIUM: 136 mmol/L (ref 135–145)
Total Bilirubin: 0.8 mg/dL (ref 0.3–1.2)
Total Protein: 7.7 g/dL (ref 6.5–8.1)

## 2015-07-25 LAB — PROTIME-INR
INR: 1.45
Prothrombin Time: 17.7 seconds — ABNORMAL HIGH (ref 11.4–15.0)

## 2015-07-25 LAB — URINALYSIS COMPLETE WITH MICROSCOPIC (ARMC ONLY)
BACTERIA UA: NONE SEEN
BILIRUBIN URINE: NEGATIVE
GLUCOSE, UA: NEGATIVE mg/dL
Ketones, ur: NEGATIVE mg/dL
Leukocytes, UA: NEGATIVE
NITRITE: NEGATIVE
Protein, ur: NEGATIVE mg/dL
Specific Gravity, Urine: 1.006 (ref 1.005–1.030)
WBC UA: NONE SEEN WBC/hpf (ref 0–5)
pH: 7 (ref 5.0–8.0)

## 2015-07-25 LAB — APTT: APTT: 34 s (ref 24–36)

## 2015-07-25 LAB — CBC WITH DIFFERENTIAL/PLATELET
BASOS ABS: 0.1 10*3/uL (ref 0–0.1)
Basophils Relative: 1 %
EOS PCT: 2 %
Eosinophils Absolute: 0.1 10*3/uL (ref 0–0.7)
HCT: 40.5 % (ref 35.0–47.0)
Hemoglobin: 13.3 g/dL (ref 12.0–16.0)
LYMPHS PCT: 17 %
Lymphs Abs: 1.4 10*3/uL (ref 1.0–3.6)
MCH: 29.9 pg (ref 26.0–34.0)
MCHC: 32.8 g/dL (ref 32.0–36.0)
MCV: 91.4 fL (ref 80.0–100.0)
MONO ABS: 0.8 10*3/uL (ref 0.2–0.9)
MONOS PCT: 9 %
Neutro Abs: 5.8 10*3/uL (ref 1.4–6.5)
Neutrophils Relative %: 71 %
PLATELETS: 235 10*3/uL (ref 150–440)
RBC: 4.43 MIL/uL (ref 3.80–5.20)
RDW: 14.3 % (ref 11.5–14.5)
WBC: 8.2 10*3/uL (ref 3.6–11.0)

## 2015-07-25 LAB — GLUCOSE, CAPILLARY
GLUCOSE-CAPILLARY: 77 mg/dL (ref 65–99)
Glucose-Capillary: 206 mg/dL — ABNORMAL HIGH (ref 65–99)

## 2015-07-25 LAB — TROPONIN I: Troponin I: <0.03 ng/mL (ref <0.031)

## 2015-07-25 LAB — ETHANOL

## 2015-07-25 MED ORDER — ALPRAZOLAM 1 MG PO TABS
1.0000 mg | ORAL_TABLET | Freq: Every evening | ORAL | Status: DC | PRN
Start: 2015-07-25 — End: 2015-07-26

## 2015-07-25 MED ORDER — INSULIN ASPART 100 UNIT/ML ~~LOC~~ SOLN
0.0000 [IU] | Freq: Every day | SUBCUTANEOUS | Status: DC
  Administered 2015-07-25: 2 [IU] via SUBCUTANEOUS

## 2015-07-25 MED ORDER — HEPARIN SODIUM (PORCINE) 5000 UNIT/ML IJ SOLN
5000.0000 [IU] | Freq: Three times a day (TID) | INTRAMUSCULAR | Status: DC
  Administered 2015-07-25: 18:00:00 5000 [IU] via SUBCUTANEOUS
  Filled 2015-07-25: qty 1

## 2015-07-25 MED ORDER — FUROSEMIDE 20 MG PO TABS
20.0000 mg | ORAL_TABLET | Freq: Every day | ORAL | Status: DC
  Administered 2015-07-25 – 2015-07-26 (×2): 20 mg via ORAL
  Filled 2015-07-25 (×2): qty 1

## 2015-07-25 MED ORDER — ACETAMINOPHEN 650 MG RE SUPP: 650.0000 mg | RECTAL | Status: DC | PRN

## 2015-07-25 MED ORDER — INSULIN ASPART 100 UNIT/ML ~~LOC~~ SOLN: 20.0000 [IU] | Freq: Every morning | SUBCUTANEOUS | Status: DC

## 2015-07-25 MED ORDER — AMLODIPINE BESYLATE 5 MG PO TABS
5.0000 mg | ORAL_TABLET | Freq: Every day | ORAL | Status: DC
  Administered 2015-07-25 – 2015-07-26 (×2): 5 mg via ORAL
  Filled 2015-07-25 (×2): qty 1

## 2015-07-25 MED ORDER — STROKE: EARLY STAGES OF RECOVERY BOOK
Freq: Once | Status: AC
  Administered 2015-07-25: 18:00:00

## 2015-07-25 MED ORDER — APIXABAN 5 MG PO TABS
5.0000 mg | ORAL_TABLET | Freq: Two times a day (BID) | ORAL | Status: DC
  Administered 2015-07-25 – 2015-07-26 (×3): 5 mg via ORAL
  Filled 2015-07-25 (×3): qty 1

## 2015-07-25 MED ORDER — OXYCODONE-ACETAMINOPHEN 5-325 MG PO TABS: 1.0000 | ORAL_TABLET | Freq: Four times a day (QID) | ORAL | Status: DC | PRN

## 2015-07-25 MED ORDER — PANTOPRAZOLE SODIUM 40 MG PO TBEC
40.0000 mg | DELAYED_RELEASE_TABLET | Freq: Every day | ORAL | Status: DC
  Administered 2015-07-25 – 2015-07-26 (×2): 40 mg via ORAL
  Filled 2015-07-25 (×3): qty 1

## 2015-07-25 MED ORDER — GABAPENTIN 100 MG PO CAPS
200.0000 mg | ORAL_CAPSULE | Freq: Three times a day (TID) | ORAL | Status: DC
  Administered 2015-07-25 – 2015-07-26 (×3): 200 mg via ORAL
  Filled 2015-07-25 (×5): qty 2

## 2015-07-25 MED ORDER — PRAVASTATIN SODIUM 40 MG PO TABS
40.0000 mg | ORAL_TABLET | Freq: Every day | ORAL | Status: DC
  Administered 2015-07-25: 40 mg via ORAL
  Filled 2015-07-25: qty 1

## 2015-07-25 MED ORDER — ACETAMINOPHEN 325 MG PO TABS: 650.0000 mg | ORAL_TABLET | ORAL | Status: DC | PRN

## 2015-07-25 MED ORDER — INSULIN ASPART 100 UNIT/ML ~~LOC~~ SOLN
25.0000 [IU] | Freq: Every day | SUBCUTANEOUS | Status: DC
  Filled 2015-07-25: qty 25

## 2015-07-25 MED ORDER — LOSARTAN POTASSIUM 50 MG PO TABS
100.0000 mg | ORAL_TABLET | Freq: Every day | ORAL | Status: DC
  Administered 2015-07-25 – 2015-07-26 (×2): 100 mg via ORAL
  Filled 2015-07-25 (×2): qty 2

## 2015-07-25 MED ORDER — MOMETASONE FURO-FORMOTEROL FUM 100-5 MCG/ACT IN AERO
2.0000 | INHALATION_SPRAY | Freq: Two times a day (BID) | RESPIRATORY_TRACT | Status: DC
  Administered 2015-07-25 – 2015-07-26 (×2): 2 via RESPIRATORY_TRACT
  Filled 2015-07-25: qty 8.8

## 2015-07-25 MED ORDER — INSULIN ASPART 100 UNIT/ML ~~LOC~~ SOLN
0.0000 [IU] | Freq: Three times a day (TID) | SUBCUTANEOUS | Status: DC
  Administered 2015-07-26 (×2): 2 [IU] via SUBCUTANEOUS
  Filled 2015-07-25: qty 2
  Filled 2015-07-25: qty 3

## 2015-07-25 MED ORDER — ACETAMINOPHEN 500 MG PO TABS: 1000.0000 mg | ORAL_TABLET | Freq: Four times a day (QID) | ORAL | Status: DC | PRN

## 2015-07-25 NOTE — ED Notes (Signed)
MD at bedside. 

## 2015-07-25 NOTE — ED Notes (Addendum)
Patient presents with slurred speech and difficulty controlling her saliva that was first noticed by her daughter when patient woke up, last seen normal was last night when she went to bed at 2300.  Patient had a pacemaker placed a couple of months ago and has been experiencing chest and shoulder discomfort since that time.  Patient is alert and oriented, she woke up at 0600 and noticed the difficulty speaking herself at that time.  Triage called code stroke and patient was transported to CT, however LSN was greater than 8 hours ago since patient woke up with these symptoms.  Patient also has right sided facial droop.

## 2015-07-25 NOTE — H&P (Addendum)
[place] [facility] [place] NAME: Amber [name]    MR#:  [id number]  DATE OF BIRTH:  March 03, 1930  DATE OF ADMISSION:  07/25/2015  PRIMARY CARE PHYSICIAN: Idelle Crouch, MD   REQUESTING/REFERRING PHYSICIAN: Dr Edd Fabian  CHIEF COMPLAINT:   Chief Complaint  Patient presents with  . Stroke Symptoms  . Chest Pain    HISTORY OF PRESENT ILLNESS:  Amber [name]  is a 82 y.o. female with a known history of pacemaker placement in July 2016 for sick sinus syndrome, coronary artery disease status post CABG in 2008, diabetes, hypertension presents today with new right facial droop and speech change which started upon waking up this morning. She reports that when she woke up she noted that she was drooling from the right side of her mouth, she called to her daughter-in-law and was unable to get words out clearly. Symptoms lasted greater than one hour, and are now resolving. CT of the head is negative for stroke. She is being admitted for further evaluation and treatment of TIA versus stroke. She also notes left-sided chest pain but states that this is been present since the insertion of the pacemaker and has not gotten worse.  PAST MEDICAL HISTORY:   Past Medical History  Diagnosis Date  . Hyperlipidemia   . Anemia   . Chronic pain   . Diabetes mellitus without complication ([place])   . Essential hypertension   . Cough   . Anxiety   . High risk medication use   . Benign neoplasm of colon   . Coronary artery disease with unspecified angina pectoris   . Plantar fascial fibromatosis   . Complication of anesthesia 1980's    hard time waking up  . Arthritis   . CHF (congestive heart failure) ([place])   . Presence of permanent cardiac pacemaker   . Shortness of breath dyspnea   . Chronic kidney disease     PAST SURGICAL HISTORY:   Past Surgical History  Procedure Laterality Date  . Total knee arthroplasty Right   . Orif ankle fracture  Right 07/27/2014    Procedure: OPEN REDUCTION INTERNAL FIXATION (ORIF) ANKLE FRACTURE;  Surgeon: Alta Corning, MD;  Location: [facility];  Service: Orthopedics;  Laterality: Right;  . Abdominal hysterectomy    . Coronary artery bypass graft  2008  . Total hip arthroplasty Right   . Rotator cuff repair    . Vein ligation and stripping    . Back surgery  1960's    cage and screws in lower back  . Joint replacement Right     hip and knee  . Cataract extraction w/ intraocular lens  implant, bilateral Bilateral   . Pacemaker insertion Left 03/14/2015    Procedure: INSERTION PACEMAKER;  Surgeon: Isaias Cowman, MD;  Location: [facility];  Service: Cardiovascular;  Laterality: Left;    SOCIAL HISTORY:   Social History  Substance Use Topics  . Smoking status: Never Smoker   . Smokeless tobacco: Never Used  . Alcohol Use: No    FAMILY HISTORY:   Family History  Problem Relation Age of Onset  . Heart attack Mother   . Heart disease Father   . Alzheimer's disease Sister   . Cervical cancer Sister   . Heart failure Son     DRUG ALLERGIES:   Allergies  Allergen Reactions  . Ambien [Zolpidem Tartrate] Other (See Comments)    Reaction:  Keeps pt awake   . Etodolac  Other (See Comments)    Reaction:  Unknown   . Iodine Itching  . Nsaids Other (See Comments)    Reaction:  Unknown   . Penicillins Itching and Other (See Comments)    Has patient had a PCN reaction causing immediate rash, facial/tongue/throat swelling, SOB or lightheadedness with hypotension: No Has patient had a PCN reaction causing severe rash involving mucus membranes or skin necrosis: No Has patient had a PCN reaction that required hospitalization No Has patient had a PCN reaction occurring within the last 10 years: No If all of the above answers are "NO", then may proceed with Cephalosporin use.  . Succinylcholine Other (See Comments)    Reaction:  Unknown     REVIEW OF SYSTEMS:   Review of Systems   Constitutional: Negative for fever, chills, weight loss and malaise/fatigue.  HENT: Negative for congestion and hearing loss.   Eyes: Negative for blurred vision and pain.  Respiratory: Positive for cough. Negative for hemoptysis, sputum production, shortness of breath and stridor.   Cardiovascular: Positive for chest pain. Negative for palpitations, orthopnea and leg swelling.  Gastrointestinal: Negative for nausea, vomiting, abdominal pain, diarrhea, constipation and blood in stool.  Genitourinary: Negative for dysuria and frequency.  Musculoskeletal: Negative for myalgias, back pain, joint pain and neck pain.  Skin: Negative for rash.  Neurological: Positive for speech change and focal weakness. Negative for loss of consciousness and headaches.  Endo/Heme/Allergies: Does not bruise/bleed easily.  Psychiatric/Behavioral: Negative for depression and hallucinations. The patient is not nervous/anxious.     MEDICATIONS AT HOME:   Prior to Admission medications   Medication Sig Start Date End Date Taking? Authorizing Provider  acetaminophen (TYLENOL) 500 MG tablet Take 1,000 mg by mouth every 6 (six) hours as needed for mild pain or headache.   Yes Historical Provider, MD  alendronate (FOSAMAX) 70 MG tablet Take 70 mg by mouth once a week. Pt takes on Thursday.   Yes Historical Provider, MD  ALPRAZolam Duanne Moron) 1 MG tablet Take 1 tablet (1 mg total) by mouth at bedtime as needed for anxiety. 07/31/14  Yes Silver Huguenin Elgergawy, MD  amLODipine (NORVASC) 5 MG tablet Take 5 mg by mouth daily.   Yes Historical Provider, MD  apixaban (ELIQUIS) 5 MG TABS tablet Take 5 mg by mouth 2 (two) times daily.   Yes Historical Provider, MD  calcium citrate-vitamin D (CITRACAL+D) 315-200 MG-UNIT per tablet Take 1 tablet by mouth daily at 12 noon.    Yes Historical Provider, MD  Fluticasone-Salmeterol (ADVAIR) 250-50 MCG/DOSE AEPB Inhale 1 puff into the lungs 2 (two) times daily.   Yes Historical Provider, MD   furosemide (LASIX) 20 MG tablet Take 1 tablet (20 mg total) by mouth daily. 12/25/14 12/25/15 Yes Nance Pear, MD  gabapentin (NEURONTIN) 100 MG capsule Take 200 mg by mouth 3 (three) times daily.    Yes Historical Provider, MD  insulin regular (NOVOLIN R,HUMULIN R) 100 units/mL injection Inject 20-25 Units into the skin 2 (two) times daily. Pt uses 20 units in the morning and 25 units at bedtime.   Yes Historical Provider, MD  losartan (COZAAR) 100 MG tablet Take 100 mg by mouth daily.   Yes Historical Provider, MD  Multiple Vitamins-Minerals (PRESERVISION AREDS 2) CAPS Take 1 capsule by mouth daily.   Yes Historical Provider, MD  omeprazole (PRILOSEC) 20 MG capsule Take 20 mg by mouth daily.   Yes Historical Provider, MD  oxyCODONE-acetaminophen (PERCOCET/ROXICET) 5-325 MG per tablet Take 1 tablet by mouth every  6 (six) hours as needed for moderate pain. 07/31/14  Yes Silver Huguenin Elgergawy, MD  pravastatin (PRAVACHOL) 40 MG tablet Take 40 mg by mouth at bedtime.   Yes Historical Provider, MD  clarithromycin (BIAXIN) 250 MG tablet Take 1 tablet (250 mg total) by mouth 2 (two) times daily. Patient not taking: Reported on 07/25/2015 03/15/15   Isaias Cowman, MD      VITAL SIGNS:  Blood pressure 180/80, pulse 58, temperature 98.2 F (36.8 C), temperature source Oral, resp. rate 19, height 5\' 1"  ([id number] m), weight 72.576 kg (160 lb), SpO2 100 %.  PHYSICAL EXAMINATION:  GENERAL:  82 y.o.-year-old patient sitting up the bed with no acute distress.  EYES: Pupils equal, round, reactive to light and accommodation. No scleral icterus. Extraocular muscles intact.  HEENT: Head atraumatic, normocephalic. Oropharynx and nasopharynx clear. His membranes pink and moist NECK:  Supple, no jugular venous distention. No thyroid enlargement, no tenderness.  LUNGS: Normal breath sounds bilaterally, no wheezing, rales,rhonchi or crepitation. No use of accessory muscles of respiration.  CARDIOVASCULAR: S1, S2  normal. No murmurs, rubs, or gallops.  ABDOMEN: Soft, nontender, nondistended. Bowel sounds present. No organomegaly or mass. No guarding no rebound EXTREMITIES: No pedal edema, cyanosis, or clubbing. Pulses 1+ NEUROLOGIC: Very slight right facial droop, otherwise cranial nerves are grossly intact, strength and sensation intact PSYCHIATRIC: The patient is alert and oriented x 3. Calm SKIN: No obvious rash, lesion, or ulcer.   LABORATORY PANEL:   CBC  Recent Labs Lab 07/25/15 1149  WBC 8.2  HGB 13.3  HCT 40.5  PLT 235   ------------------------------------------------------------------------------------------------------------------  Chemistries   Recent Labs Lab 07/25/15 1149  NA 136  K 4.0  CL 101  CO2 27  GLUCOSE 149*  BUN 26*  CREATININE 1.05*  CALCIUM 9.7  AST 19  ALT 15  ALKPHOS 58  BILITOT 0.8   ------------------------------------------------------------------------------------------------------------------  Cardiac Enzymes  Recent Labs Lab 07/25/15 1149  TROPONINI <0.03   ------------------------------------------------------------------------------------------------------------------  RADIOLOGY:  Ct Head Wo Contrast  07/25/2015  CLINICAL DATA:  Left-sided facial droop.  Code stroke. EXAM: CT HEAD WITHOUT CONTRAST TECHNIQUE: Contiguous axial images were obtained from the base of the skull through the vertex without intravenous contrast. COMPARISON:  07/27/2014 FINDINGS: Stable moderately advanced small vessel ischemic changes in the periventricular white matter. The brain demonstrates no evidence of hemorrhage, infarction, edema, mass effect, extra-axial fluid collection, hydrocephalus or mass lesion. The skull is unremarkable. IMPRESSION: No acute findings.  Stable small vessel disease. These results were called by telephone at the time of interpretation on 07/25/2015 at 11:40 AM to Dr. Conni Slipper, who verbally acknowledged these results. Electronically  Signed   By: Aletta Edouard M.D.   On: 07/25/2015 11:42   Dg Chest Portable 1 View  07/25/2015  CLINICAL DATA:  Slurred speech, facial droop and chest pain. EXAM: PORTABLE CHEST 1 VIEW COMPARISON:  03/14/2015 FINDINGS: Since pacemaker placement, there is a change in configuration of the single right ventricular lead. New loop of the lead is present at the level of the right atrium and the lead may be retracted from the right ventricular apex. Correlation suggested with pacemaker function/capture. The heart size is stable. Lung volumes are low bilaterally. There is no evidence of pulmonary edema, consolidation, pneumothorax, nodule or pleural fluid. a IMPRESSION: Change in configuration of pacemaker lead with development of loop in the right atrium. The tip of the lead may be retracted from the right ventricular apex. Correlation suggested with pacemaker function. Electronically Signed  By: Aletta Edouard M.D.   On: 07/25/2015 13:10    EKG:   Orders placed or performed during the hospital encounter of 07/25/15  . ED EKG  . ED EKG  . EKG 12-Lead  . EKG 12-Lead  . EKG 12-Lead  . EKG 12-Lead  . EKG 12-Lead  . EKG 12-Lead    IMPRESSION AND PLAN:   1. TIA versus CVA: CT is negative. She does have a very mild persistent right sided facial droop. She has a new pacemaker placed so cannot get an MRI. Will obtain carotid Dopplers and a 2-D echocardiogram.   2. Hypertension: Initial blood pressure is very elevated consistent with possible CVA. Now back in normal range. Continue amlodipine, losartan  3. Question regarding pacemaker placement: Will discuss with cardiology. Possible change in position. No dysfunction.  4. GERD: Continue PPI  5. Coronary artery disease: Does report some left-sided chest pain, though this is more likely due to pacemaker musculoskeletal discomfort we'll cycle cardiac enzymes and monitor on telemetry. Continue with statin, ELIQUIS,  6. Diabetes mellitus type 2: Check  hemoglobin A1c. Continue insulin as she takes at home  All the records are reviewed and case discussed with ED provider. Management plans discussed with the patient, family and they are in agreement.  CODE STATUS: Full  TOTAL TIME TAKING CARE OF THIS PATIENT: 55 minutes.  Greater than 50% of time spent in coordination of care and counseling.  Myrtis Ser M.D on 07/25/2015 at 2:55 PM  Between 7am to 6pm - Pager - [phone]  After 6pm go to [email or web address] - password [email or web address] [facility]  Office  [phone]  CC: Primary care physician; Idelle Crouch, MD

## 2015-07-25 NOTE — ED Provider Notes (Signed)
[facility] Emergency Department Provider Note  ____________________________________________  Time seen: Approximately 12:06 PM  I have reviewed the triage vital signs and the nursing notes.   HISTORY  Chief Complaint Stroke Symptoms and Chest Pain    HPI Amber [name] is a 82 y.o. female history of diabetes, atrial fibrillation on elliquis, sick sinus syndrome status post pacemaker, coronary artery disease who presents for evaluation of right facial droop and speech difficulty noted upon awakening this morning, currently mild to moderate, no modifying factors. The patient reports that yesterday throughout the day she had some mild soreness in the left chest which she often has since having her pacemaker placed; is not ripping or tearing in nature, does not radiate to the back or down towards the feet. She took some Tylenol and that seemed to help the chest discomfort. The chest pain was not worsened with exertion, not associated with any shortness of breath, nausea, vomiting. This morning upon awakening, her daughter noted that she was drooling, her right face was drooping, she was having some difficulty understanding her. Her speech symptoms have improved. Currently she is complaining of some mild chest discomfort in the left chest. He denies any recent illness include no cough, vomiting, diarrhea, fevers or chills. No dysuria or hematuria.   Past Medical History  Diagnosis Date  . Hyperlipidemia   . Anemia   . Chronic pain   . Diabetes mellitus without complication ([place])   . Essential hypertension   . Cough   . Anxiety   . High risk medication use   . Benign neoplasm of colon   . Coronary artery disease with unspecified angina pectoris   . Plantar fascial fibromatosis   . Complication of anesthesia 1980's    hard time waking up  . Arthritis   . CHF (congestive heart failure) [facility])     Patient Active Problem List   Diagnosis Date Noted  . Sick  sinus syndrome ([place]) 03/14/2015  . Bradycardia 02/16/2015  . Chronic heart failure ([place]) 01/05/2015  . Fracture dislocation of ankle 07/27/2014  . IDDM (insulin dependent diabetes mellitus) ([place]) 07/27/2014  . Essential hypertension 07/27/2014  . Multiple rib fractures 07/27/2014  . 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    Past Surgical History  Procedure Laterality Date  . Total knee arthroplasty Right   . Orif ankle fracture Right 07/27/2014    Procedure: OPEN REDUCTION INTERNAL FIXATION (ORIF) ANKLE FRACTURE;  Surgeon: Alta Corning, MD;  Location: [place];  Service: Orthopedics;  Laterality: Right;  . Abdominal hysterectomy    . Coronary artery bypass graft  2008  . Total hip arthroplasty Right   . Rotator cuff repair    . Vein ligation and stripping    . Back surgery  1960's    cage and screws in lower back  . Joint replacement Right     hip and knee  . Cataract extraction w/ intraocular lens  implant, bilateral Bilateral   . Pacemaker insertion Left 03/14/2015    Procedure: INSERTION PACEMAKER;  Surgeon: Isaias Cowman, MD;  Location: [facility] ORS;  Service: Cardiovascular;  Laterality: Left;    Current Outpatient Rx  Name  Route  Sig  Dispense  Refill  . acetaminophen (TYLENOL) 500 MG tablet   Oral   Take 1,000 mg by mouth every 6 (six) hours as needed for mild pain or headache.         . alendronate (FOSAMAX) 70 MG tablet   Oral  Take 70 mg by mouth once a week. Pt takes on Thursday.         . ALPRAZolam (XANAX) 1 MG tablet   Oral   Take 1 tablet (1 mg total) by mouth at bedtime as needed for anxiety.   30 tablet   0   . amLODipine (NORVASC) 5 MG tablet   Oral   Take 5 mg by mouth daily.         Marland Kitchen apixaban (ELIQUIS) 5 MG TABS tablet   Oral   Take 5 mg by mouth 2 (two) times daily.         . calcium citrate-vitamin D (CITRACAL+D) 315-200 MG-UNIT per tablet   Oral   Take 1 tablet by mouth daily at 12 noon.          . Fluticasone-Salmeterol (ADVAIR)  250-50 MCG/DOSE AEPB   Inhalation   Inhale 1 puff into the lungs 2 (two) times daily.         . furosemide (LASIX) 20 MG tablet   Oral   Take 1 tablet (20 mg total) by mouth daily.   10 tablet   0   . gabapentin (NEURONTIN) 100 MG capsule   Oral   Take 200 mg by mouth 3 (three) times daily.          . insulin regular (NOVOLIN R,HUMULIN R) 100 units/mL injection   Subcutaneous   Inject 20-25 Units into the skin 2 (two) times daily. Pt uses 20 units in the morning and 25 units at bedtime.         Marland Kitchen losartan (COZAAR) 100 MG tablet   Oral   Take 100 mg by mouth daily.         . Multiple Vitamins-Minerals (PRESERVISION AREDS 2) CAPS   Oral   Take 1 capsule by mouth daily.         Marland Kitchen omeprazole (PRILOSEC) 20 MG capsule   Oral   Take 20 mg by mouth daily.         Marland Kitchen oxyCODONE-acetaminophen (PERCOCET/ROXICET) 5-325 MG per tablet   Oral   Take 1 tablet by mouth every 6 (six) hours as needed for moderate pain.   30 tablet   0   . pravastatin (PRAVACHOL) 40 MG tablet   Oral   Take 40 mg by mouth at bedtime.         . clarithromycin (BIAXIN) 250 MG tablet   Oral   Take 1 tablet (250 mg total) by mouth 2 (two) times daily. Patient not taking: Reported on 07/25/2015   14 tablet   0     Allergies Ambien; Etodolac; Iodine; Nsaids; Penicillins; and Succinylcholine  Family History  Problem Relation Age of Onset  . Heart attack Mother   . Heart disease Father   . Alzheimer's disease Sister   . Cervical cancer Sister   . Heart failure Son     Social History Social History  Substance Use Topics  . Smoking status: Never Smoker   . Smokeless tobacco: Never Used  . Alcohol Use: No    Review of Systems Constitutional: No fever/chills Eyes: No visual changes. ENT: No sore throat. Cardiovascular: +chest pain. Respiratory: Denies shortness of breath. Gastrointestinal: No abdominal pain.  No nausea, no vomiting.  No diarrhea.  No constipation. Genitourinary:  Negative for dysuria. Musculoskeletal: Negative for back pain. Skin: Negative for rash. Neurological: Negative for headaches, + right facial droop  10-point ROS otherwise negative.  ____________________________________________   PHYSICAL EXAM:  VITAL  SIGNS: ED Triage Vitals  Enc Vitals Group     BP 07/25/15 1147 180/72 mmHg     Pulse Rate 07/25/15 1147 60     Resp 07/25/15 1147 17     Temp 07/25/15 1147 98.2 F (36.8 C)     Temp Source 07/25/15 1147 Oral     SpO2 07/25/15 1147 99 %     Weight 07/25/15 1147 160 lb (72.576 kg)     Height 07/25/15 1147 5\' 1"  (1.549 m)     Head Cir --      Peak Flow --      Pain Score 07/25/15 1148 2     Pain Loc --      Pain Edu? --      Excl. in [place]? --     Constitutional: Alert and oriented. Well appearing and in no acute distress. Eyes: Conjunctivae are normal. PERRL. EOMI. Head: Atraumatic. Nose: No congestion/rhinnorhea. Mouth/Throat: Mucous membranes are moist.  Oropharynx non-erythematous. Neck: No stridor.   Cardiovascular: Normal rate, regular rhythm. Grossly normal heart sounds.  Good peripheral circulation. Respiratory: Normal respiratory effort.  No retractions. Lungs CTAB. Gastrointestinal: Soft and nontender. No distention. No CVA tenderness. Genitourinary: deferred Musculoskeletal: No lower extremity tenderness nor edema.  No joint effusions. Neurologic:  Normal speech and language. Mild right facial droop. 5/5 strength in bilateral upper and lower extremities. Sensation intact to light touch throughout. No pronator drift. No dysarthria or aphasia. Skin:  Skin is warm, dry and intact. No rash noted. Psychiatric: Mood and affect are normal. Speech and behavior are normal.  ____________________________________________   LABS (all labs ordered are listed, but only abnormal results are displayed)  Labs Reviewed  PROTIME-INR - Abnormal; Notable for the following:    Prothrombin Time 17.7 (*)    All other components within  normal limits  COMPREHENSIVE METABOLIC PANEL - Abnormal; Notable for the following:    Glucose, Bld 149 (*)    BUN 26 (*)    Creatinine, Ser 1.05 (*)    GFR calc non Af Amer 48 (*)    GFR calc Af Amer 55 (*)    All other components within normal limits  URINALYSIS COMPLETEWITH MICROSCOPIC ([facility] ONLY) - Abnormal; Notable for the following:    Color, Urine STRAW (*)    APPearance CLEAR (*)    Hgb urine dipstick 1+ (*)    Squamous Epithelial / LPF 0-5 (*)    All other components within normal limits  ETHANOL  APTT  CBC WITH DIFFERENTIAL/PLATELET  TROPONIN I  CBC  DIFFERENTIAL   ____________________________________________  EKG  ED ECG REPORT I, Joanne Gavel, the attending physician, personally viewed and interpreted this ECG.   Date: 07/25/2015  EKG Time: 13:27  Rate: 63  Rhythm: Ventricular paced rhythm  ____________________________________________  RADIOLOGY  CXR IMPRESSION: Change in configuration of pacemaker lead with development of loop in the right atrium. The tip of the lead may be retracted from the right ventricular apex. Correlation suggested with pacemaker function.  CT head IMPRESSION: No acute findings. Stable small vessel disease. ____________________________________________   PROCEDURES  Procedure(s) performed: None  Critical Care performed: Yes, see critical care note(s). Total critical care time spent 30 minutes.  ____________________________________________   INITIAL IMPRESSION / ASSESSMENT AND PLAN / ED COURSE  Pertinent labs & imaging results that were available during my care of the patient were reviewed by me and considered in my medical decision making (see chart for details).  Amber [name] is a  82 y.o. female history of diabetes, atrial fibrillation on elliquis, sick sinus syndrome status post pacemaker, coronary artery disease who presents for evaluation of right facial droop and speech difficulty noted upon awakening  this morning. On exam, she is nontoxic appearing and in no acute distress. Vital signs are stable, she is afebrile. She does have faint right facial droop/smile asymmetry but the remainder of her neurological exam is intact. NIH stroke scale is 1 and given mild deficits, given that symptoms were noted upon awakening, she is not a candidate for TPA. My concern is for possible acute CVA. Plan for basic labs, EKG. CT head is negative for any acute intracranial process. Her chest pain is likely musculoskeletal in nature given she has had intermittent similar pain there since pacemaker placement but we'll obtain serial cardiac enzymes. I doubt that this represents acute aortic dissection or PE. Anticipate admission.   ----------------------------------------- 1:48 PM on 07/25/2015 ----------------------------------------- Labs reviewed. CBC unremarkable. Negative initial troponin. CMP with mild BUN and creatinine elevation. Troponin negative. Coags with INR 1.45 . Urinalysis is not consistent with infection. Chest x-ray showed a paced maker lead loop in the right atrium, recommended correlation with pacemaker function. Pacemaker is functioning appropriately on cardiac monitor and on EKG.. Aspirin given. Case discussed with hospitalist for admission.   ____________________________________________   FINAL CLINICAL IMPRESSION(S) / ED DIAGNOSES  Final diagnoses:  Facial droop due to stroke  Cerebral infarction due to unspecified mechanism      Joanne Gavel, MD 07/25/15 1351

## 2015-07-26 ENCOUNTER — Observation Stay: Payer: Commercial Managed Care - HMO

## 2015-07-26 ENCOUNTER — Observation Stay
Admit: 2015-07-26 | Discharge: 2015-07-26 | Disposition: A | Discharge status: Home / Self Care | Payer: Commercial Managed Care - HMO | Attending: Internal Medicine | Admitting: Internal Medicine

## 2015-07-26 DIAGNOSIS — I638 Other cerebral infarction: Secondary | ICD-10-CM | POA: Diagnosis not present

## 2015-07-26 DIAGNOSIS — I63231 Cerebral infarction due to unspecified occlusion or stenosis of right carotid arteries: Secondary | ICD-10-CM | POA: Diagnosis not present

## 2015-07-26 DIAGNOSIS — E119 Type 2 diabetes mellitus without complications: Secondary | ICD-10-CM | POA: Diagnosis not present

## 2015-07-26 DIAGNOSIS — G459 Transient cerebral ischemic attack, unspecified: Secondary | ICD-10-CM | POA: Diagnosis not present

## 2015-07-26 DIAGNOSIS — I10 Essential (primary) hypertension: Secondary | ICD-10-CM | POA: Diagnosis not present

## 2015-07-26 DIAGNOSIS — I63232 Cerebral infarction due to unspecified occlusion or stenosis of left carotid arteries: Secondary | ICD-10-CM | POA: Diagnosis not present

## 2015-07-26 DIAGNOSIS — I4891 Unspecified atrial fibrillation: Secondary | ICD-10-CM | POA: Diagnosis not present

## 2015-07-26 LAB — GLUCOSE, CAPILLARY
GLUCOSE-CAPILLARY: 165 mg/dL — AB (ref 65–99)
Glucose-Capillary: 184 mg/dL — ABNORMAL HIGH (ref 65–99)

## 2015-07-26 LAB — CBC
HCT: 37.4 % (ref 35.0–47.0)
Hemoglobin: 12.6 g/dL (ref 12.0–16.0)
MCH: 30.6 pg (ref 26.0–34.0)
MCHC: 33.7 g/dL (ref 32.0–36.0)
MCV: 90.9 fL (ref 80.0–100.0)
PLATELETS: 205 10*3/uL (ref 150–440)
RBC: 4.11 MIL/uL (ref 3.80–5.20)
RDW: 14.3 % (ref 11.5–14.5)
WBC: 6.8 10*3/uL (ref 3.6–11.0)

## 2015-07-26 LAB — HEMOGLOBIN A1C
HEMOGLOBIN A1C: 7 % — AB (ref 4.0–6.0)
Hgb A1c MFr Bld: 6.8 % — ABNORMAL HIGH (ref 4.0–6.0)

## 2015-07-26 LAB — BASIC METABOLIC PANEL
Anion gap: 9 (ref 5–15)
BUN: 23 mg/dL — ABNORMAL HIGH (ref 6–20)
CALCIUM: 9.5 mg/dL (ref 8.9–10.3)
CO2: 25 mmol/L (ref 22–32)
CREATININE: 0.93 mg/dL (ref 0.44–1.00)
Chloride: 103 mmol/L (ref 101–111)
GFR calc non Af Amer: 55 mL/min — ABNORMAL LOW (ref >60)
Glucose, Bld: 151 mg/dL — ABNORMAL HIGH (ref 65–99)
Potassium: 3.5 mmol/L (ref 3.5–5.1)
SODIUM: 137 mmol/L (ref 135–145)

## 2015-07-26 LAB — LIPID PANEL
CHOLESTEROL: 177 mg/dL (ref 0–200)
HDL: 61 mg/dL (ref >40)
LDL Cholesterol: 92 mg/dL (ref 0–99)
Total CHOL/HDL Ratio: 2.9 RATIO
Triglycerides: 121 mg/dL (ref <150)
VLDL: 24 mg/dL (ref 0–40)

## 2015-07-26 LAB — TROPONIN I

## 2015-07-26 NOTE — Evaluation (Signed)
Clinical/Bedside Swallow Evaluation Patient Details  Name: Amber [name] MRN: [id number] Date of Birth: 1929-08-25  Today's Date: 07/26/2015 Time: SLP Start Time (ACUTE ONLY): 1100 SLP Stop Time (ACUTE ONLY): 1200 SLP Time Calculation (min) (ACUTE ONLY): 60 min  Past Medical History:  Past Medical History  Diagnosis Date  . Hyperlipidemia   . Anemia   . Chronic pain   . Diabetes mellitus without complication ([place])   . Essential hypertension   . Cough   . Anxiety   . High risk medication use   . Benign neoplasm of colon   . Coronary artery disease with unspecified angina pectoris   . Plantar fascial fibromatosis   . Complication of anesthesia 1980's    hard time waking up  . Arthritis   . CHF (congestive heart failure) ([place])   . Presence of permanent cardiac pacemaker   . Shortness of breath dyspnea   . Chronic kidney disease    Past Surgical History:  Past Surgical History  Procedure Laterality Date  . Total knee arthroplasty Right   . Orif ankle fracture Right 07/27/2014    Procedure: OPEN REDUCTION INTERNAL FIXATION (ORIF) ANKLE FRACTURE;  Surgeon: Alta Corning, MD;  Location: [place];  Service: Orthopedics;  Laterality: Right;  . Abdominal hysterectomy    . Coronary artery bypass graft  2008  . Total hip arthroplasty Right   . Rotator cuff repair    . Vein ligation and stripping    . Back surgery  1960's    cage and screws in lower back  . Joint replacement Right     hip and knee  . Cataract extraction w/ intraocular lens  implant, bilateral Bilateral   . Pacemaker insertion Left 03/14/2015    Procedure: INSERTION PACEMAKER;  Surgeon: Isaias Cowman, MD;  Location: [facility] ORS;  Service: Cardiovascular;  Laterality: Left;   HPI:  Pt is a 82 y.o. female with a known history of CHF, pacemaker placement in July 2016 for sick sinus syndrome, coronary artery disease status post CABG in 2008, diabetes, hypertension, chronic cough, kidney dis., and other medical  issues per chart who presents today with new right facial droop and speech change which started upon waking up this morning. She reports that when she woke up she noted that she was drooling from the right side of her mouth, she called to her daughter-in-law and was unable to get words out clearly. Symptoms lasted greater than one hour, and are now resolving. CT of the head is negative for stroke. She is being admitted for further evaluation and treatment of TIA versus stroke. She also notes left-sided chest pain but states that this is been present since the insertion of the pacemaker and has not gotten worse. Pt stated she had been having difficulty talking yesterday prior to admission but that it "started getting better" yesterday pm and that now she is "talking normally again". Pt denied any other cognitive-linguistic deficits and conversed appropriately w/ SLP and NSG in room. Pt denied any trouble swallowing stating she ate her breakfast meal w/out difficulty. NSG reported pt took pills w/ liquid adequately.     Assessment / Plan / Recommendation Clinical Impression  Pt appears at reduced risk for aspiration at this time as she consumed trials of thin liquids and solids w/ no overt s/s of aspiration noted; no coughing or change in vocal quality occured. Oral phase management of the foods given during the eval appeared appropriate; pt appeared to clear appropriately and timely  w/ all trials. No cognitive-linguistic deficts noted; pt conversed appropriately w/ SLP answering questions. Pt appears at her baseline at this time; pt agreed. Rec. continue a regular diet; general aspiration precautions; pt took oral meds w/ NSG using liquid adequately per report. No further skilled ST services indicated at this time. Pt agreed.    Aspiration Risk   (reduced)    Diet Recommendation  regular; thin liquids; general aspiration precautions  Medication Administration: Whole meds with liquid    Other   Recommendations Recommended Consults:  (none) Oral Care Recommendations: Oral care BID;Patient independent with oral care   Follow up Recommendations  None    Frequency and Duration            Prognosis Prognosis for Safe Diet Advancement: Good Barriers to Reach Goals:  (none)      Swallow Study   General Date of Onset: 07/25/15 HPI: Pt is a 82 y.o. female with a known history of CHF, pacemaker placement in July 2016 for sick sinus syndrome, coronary artery disease status post CABG in 2008, diabetes, hypertension, chronic cough, kidney dis., and other medical issues per chart who presents today with new right facial droop and speech change which started upon waking up this morning. She reports that when she woke up she noted that she was drooling from the right side of her mouth, she called to her daughter-in-law and was unable to get words out clearly. Symptoms lasted greater than one hour, and are now resolving. CT of the head is negative for stroke. She is being admitted for further evaluation and treatment of TIA versus stroke. She also notes left-sided chest pain but states that this is been present since the insertion of the pacemaker and has not gotten worse. Pt stated she had been having difficulty talking yesterday prior to admission but that it "started getting better" yesterday pm and that now she is "talking normally again". Pt denied any other cognitive-linguistic deficits and conversed appropriately w/ SLP and NSG in room. Pt denied any trouble swallowing stating she ate her breakfast meal w/out difficulty. NSG reported pt took pills w/ liquid adequately.   Type of Study: Bedside Swallow Evaluation Previous Swallow Assessment: none Diet Prior to this Study: Regular;Thin liquids Temperature Spikes Noted: No (wbc 6.8) Respiratory Status: Room air History of Recent Intubation: No Behavior/Cognition: Alert;Cooperative;Pleasant mood Oral Cavity Assessment: Within Functional  Limits Oral Care Completed by SLP: No Oral Cavity - Dentition: Missing dentition Vision: Functional for self-feeding Self-Feeding Abilities: Able to feed self Patient Positioning:  (upright on EOB) Baseline Vocal Quality: Normal Volitional Cough: Strong Volitional Swallow: Able to elicit    Oral/Motor/Sensory Function Overall Oral Motor/Sensory Function: Within functional limits (no unilateral weakness noted)   Ice Chips Ice chips: Not tested   Thin Liquid Thin Liquid: Within functional limits Presentation: Self Fed;Straw (had drinks on bedside table she was drinking) Other Comments: ~4 ozs    Nectar Thick Nectar Thick Liquid: Not tested   Honey Thick Honey Thick Liquid: Not tested   Puree Puree: Within functional limits Presentation: Spoon;Self Fed (2 trials)   Solid   GO   Solid: Within functional limits Presentation: Self Fed (4 trials of graham crackers w/ ice cream)    Functional Assessment Tool Used: clinical judgement Functional Limitations: Swallowing Swallow Current Status [id number]): 0 percent impaired, limited or restricted Swallow Goal Status [id number]): 0 percent impaired, limited or restricted Swallow Discharge Status [id number]): 0 percent impaired, limited or restricted    Orinda Kenner, [place], CCC-SLP  Watson,Katherine 07/26/2015,3:45 PM

## 2015-07-26 NOTE — Evaluation (Signed)
Occupational Therapy Evaluation Patient Details Name: Amber [name] MRN: [id number] DOB: June 08, 1930 Today's Date: 07/26/2015    History of Present Illness Amber [name] is a 82 y.o. female with a known history of pacemaker placement in July 2016 for sick sinus syndrome, coronary artery disease status post CABG in 2008, diabetes, hypertension presents today with new right facial droop and speech change which started upon waking up this morning. She reports that when she woke up she noted that she was drooling from the right side of her mouth, she called to her daughter-in-law and was unable to get words out clearly. Symptoms lasted greater than one hour, and are now resolving. CT of the head is negative for stroke. She is being admitted for further evaluation and treatment of TIA versus stroke. She also notes left-sided chest pain but states that this is been present since the insertion of the pacemaker and has not gotten worse.   Clinical Impression   Order received for OT evaluation and chart reviewed.  Met with patient and family who indicate she is at baseline and able to complete ADLs with use of SPC independently again and does not have any residual weakness in UEs or hands.  No needs identified which require full OT evaluation so only screening completed.  Pt to go home today.    Follow Up Recommendations       Equipment Recommendations       Recommendations for Other Services       Precautions / Restrictions        Mobility Bed Mobility                  Transfers                      Balance                                            ADL                                               Vision     Perception     Praxis      Pertinent Vitals/Pain       Hand Dominance     Extremity/Trunk Assessment             Communication     Cognition                           General  Comments       Exercises       Shoulder Instructions      Home Living                                          Prior Functioning/Environment               OT Diagnosis:     OT Problem List:     OT Treatment/Interventions:      OT Goals(Current goals can be found in the care plan section)    OT Frequency:     Barriers  to D/C:            Co-evaluation              End of Session    Activity Tolerance:   Patient left:     Time: 1345-1400 OT Time Calculation (min): 15 min Charges:  OT Treatments $Self Care/Home Management : 8-22 mins G-Codes:    Wofford,Susan 04-Aug-2015, 3:18 PM   Chrys Racer, OTR/L ascom [phone]

## 2015-07-26 NOTE — Progress Notes (Signed)
Patient request Advance Directive Booklet. Pastoral Care provided booklet. Patient had received education from [place] earlier.

## 2015-07-26 NOTE — Plan of Care (Signed)
Problem: Education: Goal: Knowledge of [place] [facility] Education information/materials will improve Outcome: Progressing Pt oriented to unit and knows how to contact RN and NA.  Problem: Safety: Goal: Ability to remain free from injury will improve Outcome: Progressing High fall risk.  Using bed alarm and pt compliant with calling for assistance to get up.  Pt free from injury this shift.  Problem: Tissue Perfusion: Goal: Risk factors for ineffective tissue perfusion will decrease Outcome: Progressing Pt ambulating and moving all extremities well.  Problem: Education: Goal: Knowledge of secondary prevention will improve Outcome: Progressing Stroke prevention discussed with pt and family  Problem: Tissue Perfusion: Goal: Complications of Ischemic Stroke will be minimized. Outcome: Progressing NIH scale is 0.  Continue to monitor.

## 2015-07-26 NOTE — Progress Notes (Signed)
*  PRELIMINARY RESULTS* Echocardiogram 2D Echocardiogram has been performed.  Amber [name] 07/26/2015, 9:23 AM

## 2015-07-26 NOTE — Care Management Obs Status (Signed)
[place] NOTIFICATION   Patient Details  Name: Amber [name] MRN: [id number] Date of Birth: 1930/03/29   Medicare Observation Status Notification Given:  Yes    Shelbie Ammons, RN 07/26/2015, 11:11 AM

## 2015-07-26 NOTE — Discharge Summary (Signed)
[facility] NAME: Amber [name]    MR#:  [id number]  DATE OF BIRTH:  06-Apr-1930  DATE OF ADMISSION:  07/25/2015 ADMITTING PHYSICIAN: Aldean Jewett, MD  DATE OF DISCHARGE: 07/26/2015  3:01 PM  PRIMARY CARE PHYSICIAN: SPARKS,JEFFREY D, MD    ADMISSION DIAGNOSIS:  CVA (cerebral infarction) [I63.9] Facial droop due to stroke [I69.392] Cerebral infarction due to unspecified mechanism [I63.9]  DISCHARGE DIAGNOSIS:  Active Problems:   TIA (transient ischemic attack)   SECONDARY DIAGNOSIS:   Past Medical History  Diagnosis Date  . Hyperlipidemia   . Anemia   . Chronic pain   . Diabetes mellitus without complication ([place])   . Essential hypertension   . Cough   . Anxiety   . High risk medication use   . Benign neoplasm of colon   . Coronary artery disease with unspecified angina pectoris   . Plantar fascial fibromatosis   . Complication of anesthesia 1980's    hard time waking up  . Arthritis   . CHF (congestive heart failure) ([place])   . Presence of permanent cardiac pacemaker   . Shortness of breath dyspnea   . Chronic kidney disease     HOSPITAL COURSE:   82 year old female with multiple medical problems including sick sinus syndrome status post pacemaker, atrial fibrillation on anticoagulation, diabetes, chronic pain, arthritis, coronary artery disease, congestive heart failure presents to the hospital secondary to transient right facial droop and also speech change.   #1 TIA-with speech change and also right facial droop that completely resolved now. -Patient has known history of atrial fibrillation and she is already on anticoagulation. -MRI couldn't be done due to her pacemaker. -CT of the head negative for any acute findings. -carotid Doppler showing no significant stenosis. -Patient already on statin and eliquis which will be continued. -She ambulated fine and will be going home. No Physical  therapy needs.   #2 HTN-  continue losartan and Norvasc  #3 atrial fibrillation with sick sinus syndrome-status post pacemaker placement. Rate controlled. Already on anticoagulation.  #4 GERD-on Protonix  #5 CAD-stable. continue cardiac medications from home.  #6 diabetes mellitus-continue home regimen of insulin  Patient is being discharged home in a stable condition today.   DISCHARGE CONDITIONS:   Stable  CONSULTS OBTAINED:    none  DRUG ALLERGIES:   Allergies  Allergen Reactions  . Ambien [Zolpidem Tartrate] Other (See Comments)    Reaction:  Keeps pt awake   . Etodolac Other (See Comments)    Reaction:  Unknown   . Iodine Itching  . Nsaids Other (See Comments)    Reaction:  Unknown   . Penicillins Itching and Other (See Comments)    Has patient had a PCN reaction causing immediate rash, facial/tongue/throat swelling, SOB or lightheadedness with hypotension: No Has patient had a PCN reaction causing severe rash involving mucus membranes or skin necrosis: No Has patient had a PCN reaction that required hospitalization No Has patient had a PCN reaction occurring within the last 10 years: No If all of the above answers are "NO", then may proceed with Cephalosporin use.  . Succinylcholine Other (See Comments)    Reaction:  Unknown     DISCHARGE MEDICATIONS:   Discharge Medication List as of 07/26/2015  2:26 PM    CONTINUE these medications which have NOT CHANGED   Details  acetaminophen (TYLENOL) 500 MG tablet Take 1,000 mg by mouth every 6 (six) hours as needed  for mild pain or headache., Until Discontinued, Historical Med    alendronate (FOSAMAX) 70 MG tablet Take 70 mg by mouth once a week. Pt takes on Thursday., Until Discontinued, Historical Med    ALPRAZolam (XANAX) 1 MG tablet Take 1 tablet (1 mg total) by mouth at bedtime as needed for anxiety., Starting 07/31/2014, Until Discontinued, Print    amLODipine (NORVASC) 5 MG tablet Take 5 mg by mouth daily.,  Until Discontinued, Historical Med    apixaban (ELIQUIS) 5 MG TABS tablet Take 5 mg by mouth 2 (two) times daily., Until Discontinued, Historical Med    calcium citrate-vitamin D (CITRACAL+D) 315-200 MG-UNIT per tablet Take 1 tablet by mouth daily at 12 noon. , Until Discontinued, Historical Med    Fluticasone-Salmeterol (ADVAIR) 250-50 MCG/DOSE AEPB Inhale 1 puff into the lungs 2 (two) times daily., Until Discontinued, Historical Med    furosemide (LASIX) 20 MG tablet Take 1 tablet (20 mg total) by mouth daily., Starting 12/25/2014, Until Tue 12/25/15, Print    gabapentin (NEURONTIN) 100 MG capsule Take 200 mg by mouth 3 (three) times daily. , Until Discontinued, Historical Med    insulin regular (NOVOLIN R,HUMULIN R) 100 units/mL injection Inject 20-25 Units into the skin 2 (two) times daily. Pt uses 20 units in the morning and 25 units at bedtime., Until Discontinued, Historical Med    losartan (COZAAR) 100 MG tablet Take 100 mg by mouth daily., Until Discontinued, Historical Med    Multiple Vitamins-Minerals (PRESERVISION AREDS 2) CAPS Take 1 capsule by mouth daily., Until Discontinued, Historical Med    omeprazole (PRILOSEC) 20 MG capsule Take 20 mg by mouth daily., Until Discontinued, Historical Med    oxyCODONE-acetaminophen (PERCOCET/ROXICET) 5-325 MG per tablet Take 1 tablet by mouth every 6 (six) hours as needed for moderate pain., Starting 07/31/2014, Until Discontinued, Print    pravastatin (PRAVACHOL) 40 MG tablet Take 40 mg by mouth at bedtime., Until Discontinued, Historical Med      STOP taking these medications     clarithromycin (BIAXIN) 250 MG tablet          DISCHARGE INSTRUCTIONS:   1. PCP follow-up in 1-2 weeks 2. Cardiology follow up as per scheduled  If you experience worsening of your admission symptoms, develop shortness of breath, life threatening emergency, suicidal or homicidal thoughts you must seek medical attention immediately by calling 911 or  calling your MD immediately  if symptoms less severe.  You Must read complete instructions/literature along with all the possible adverse reactions/side effects for all the Medicines you take and that have been prescribed to you. Take any new Medicines after you have completely understood and accept all the possible adverse reactions/side effects.   Please note  You were cared for by a hospitalist during your hospital stay. If you have any questions about your discharge medications or the care you received while you were in the hospital after you are discharged, you can call the unit and asked to speak with the hospitalist on call if the hospitalist that took care of you is not available. Once you are discharged, your primary care physician will handle any further medical issues. Please note that NO REFILLS for any discharge medications will be authorized once you are discharged, as it is imperative that you return to your primary care physician (or establish a relationship with a primary care physician if you do not have one) for your aftercare needs so that they can reassess your need for medications and monitor your lab values.  Today   CHIEF COMPLAINT:   Chief Complaint  Patient presents with  . Stroke Symptoms  . Chest Pain    VITAL SIGNS:  Blood pressure 139/67, pulse 60, temperature 97.8 F (36.6 C), temperature source Oral, resp. rate 20, height 5\' 1"  (1.549 m), weight 72.576 kg (160 lb), SpO2 95 %.  I/O:   Intake/Output Summary (Last 24 hours) at 07/26/15 1640 Last data filed at 07/26/15 1200  Gross per 24 hour  Intake    720 ml  Output   1300 ml  Net   -580 ml    PHYSICAL EXAMINATION:   Physical Exam  GENERAL:  82 y.o.-year-old patient lying in the bed with no acute distress.  EYES: Pupils equal, round, reactive to light and accommodation. No scleral icterus. Extraocular muscles intact.  HEENT: Head atraumatic, normocephalic. Oropharynx and nasopharynx clear.   NECK:  Supple, no jugular venous distention. No thyroid enlargement, no tenderness.  LUNGS: Normal breath sounds bilaterally, no wheezing, rales,rhonchi or crepitation. No use of accessory muscles of respiration.  CARDIOVASCULAR: S1, S2 normal. No rubs, or gallops. 3/6 systolic murmur is present. Left chest wall pacemaker is present, nontender to touch, no swelling or erythema noted.  ABDOMEN: Soft, non-tender, non-distended. Bowel sounds present. No organomegaly or mass.  EXTREMITIES: No pedal edema, cyanosis, or clubbing.  NEUROLOGIC: Cranial nerves II through XII are intact. Muscle strength 5/5 in all extremities. Sensation intact. Gait not checked.  PSYCHIATRIC: The patient is alert and oriented x 3.  SKIN: No obvious rash, lesion, or ulcer.   DATA REVIEW:   CBC  Recent Labs Lab 07/26/15 0133  WBC 6.8  HGB 12.6  HCT 37.4  PLT 205    Chemistries   Recent Labs Lab 07/25/15 1149 07/26/15 0133  NA 136 137  K 4.0 3.5  CL 101 103  CO2 27 25  GLUCOSE 149* 151*  BUN 26* 23*  CREATININE 1.05* 0.93  CALCIUM 9.7 9.5  AST 19  --   ALT 15  --   ALKPHOS 58  --   BILITOT 0.8  --     Cardiac Enzymes  Recent Labs Lab 07/26/15 0133  TROPONINI <0.03    Microbiology Results  Results for orders placed or performed during the hospital encounter of 03/05/15  Surgical pcr screen     Status: Abnormal   Collection Time: 03/05/15 11:52 AM  Result Value Ref Range Status   MRSA, PCR NEGATIVE NEGATIVE Final   Staphylococcus aureus POSITIVE (A) NEGATIVE Final    Comment:        The Xpert SA Assay (FDA approved for NASAL specimens in patients over 59 years of age), is one component of a comprehensive surveillance program.  Test performance has been validated by [facility] for patients greater than or equal to 37 year old. It is not intended to diagnose infection nor to guide or monitor treatment. CRITICAL RESULT CALLED TO, READ BACK BY AND VERIFIED WITH: ERIN DAVIS AT  1334 ON 03/05/15 BY [facility]     RADIOLOGY:  Ct Head Wo Contrast  07/25/2015  CLINICAL DATA:  Left-sided facial droop.  Code stroke. EXAM: CT HEAD WITHOUT CONTRAST TECHNIQUE: Contiguous axial images were obtained from the base of the skull through the vertex without intravenous contrast. COMPARISON:  07/27/2014 FINDINGS: Stable moderately advanced small vessel ischemic changes in the periventricular white matter. The brain demonstrates no evidence of hemorrhage, infarction, edema, mass effect, extra-axial fluid collection, hydrocephalus or mass lesion. The skull is unremarkable. IMPRESSION: No acute  findings.  Stable small vessel disease. These results were called by telephone at the time of interpretation on 07/25/2015 at 11:40 AM to Dr. Conni Slipper, who verbally acknowledged these results. Electronically Signed   By: Aletta Edouard M.D.   On: 07/25/2015 11:42   US Carotid Bilateral  07/26/2015  CLINICAL DATA:  CVA. EXAM: BILATERAL CAROTID DUPLEX ULTRASOUND TECHNIQUE: Pearline Cables scale imaging, color Doppler and duplex ultrasound were performed of bilateral carotid and vertebral arteries in the neck. COMPARISON:  CT 07/25/2015 . FINDINGS: Criteria: Quantification of carotid stenosis is based on velocity parameters that correlate the residual internal carotid diameter with NASCET-based stenosis levels, using the diameter of the distal internal carotid lumen as the denominator for stenosis measurement. The following velocity measurements were obtained: RIGHT ICA:  105/27 cm/sec CCA:  0000000 cm/sec SYSTOLIC ICA/CCA RATIO:  0.7 DIASTOLIC ICA/CCA RATIO:  1.2 ECA:  107 cm/sec LEFT ICA:  78/21 cm/sec CCA:  123456 cm/sec SYSTOLIC ICA/CCA RATIO:  0.8 DIASTOLIC ICA/CCA RATIO:  1.2 ECA:  87 cm/sec RIGHT CAROTID ARTERY: Right distal common carotid and carotid bifurcation atherosclerotic plaque noted. No flow limiting stenosis. RIGHT VERTEBRAL ARTERY:  Patent with antegrade flow. LEFT CAROTID ARTERY: Left distal common carotid and  carotid bifurcation atherosclerotic plaque noted. No flow limiting stenosis. LEFT VERTEBRAL ARTERY:  Patent with antegrade flow. IMPRESSION: 1. Mild distal common carotid and carotid bifurcation plaque noted bilaterally . No flow limiting stenosis. Degree of stenosis less than 50% bilaterally. 2. Vertebral arteries are patent with antegrade flow bilaterally. Electronically Signed   By: Marcello Moores  Register   On: 07/26/2015 10:41   Dg Chest Portable 1 View  07/25/2015  CLINICAL DATA:  Slurred speech, facial droop and chest pain. EXAM: PORTABLE CHEST 1 VIEW COMPARISON:  03/14/2015 FINDINGS: Since pacemaker placement, there is a change in configuration of the single right ventricular lead. New loop of the lead is present at the level of the right atrium and the lead may be retracted from the right ventricular apex. Correlation suggested with pacemaker function/capture. The heart size is stable. Lung volumes are low bilaterally. There is no evidence of pulmonary edema, consolidation, pneumothorax, nodule or pleural fluid. a IMPRESSION: Change in configuration of pacemaker lead with development of loop in the right atrium. The tip of the lead may be retracted from the right ventricular apex. Correlation suggested with pacemaker function. Electronically Signed   By: Aletta Edouard M.D.   On: 07/25/2015 13:10    EKG:   Orders placed or performed during the hospital encounter of 07/25/15  . ED EKG  . ED EKG  . EKG 12-Lead  . EKG 12-Lead  . EKG 12-Lead  . EKG 12-Lead  . EKG 12-Lead  . EKG 12-Lead      Management plans discussed with the patient, family and they are in agreement.  CODE STATUS:     Code Status Orders        Start     Ordered   07/25/15 1709  DNR   Continuous    Discussed CODE STATUS with patient prior to discharge and she confirmed DO NOT RESUSCITATE status  07/25/15 1709    Code Status History    Date Active Date Inactive Code Status Order ID Comments User Context   03/14/2015   2:33 PM 03/15/2015  6:32 PM Full Code [id number]  Isaias Cowman, MD Inpatient   07/27/2014  3:37 PM 07/31/2014 10:33 PM Full Code [id number]  Erlene Senters, PA-C Inpatient   07/27/2014  5:05 AM 07/27/2014  3:37 PM Full Code [id number]  Etta Quill, DO ED      TOTAL TIME TAKING CARE OF THIS PATIENT: 37 minutes.    Gladstone Lighter M.D on 07/26/2015 at 4:40 PM  Between 7am to 6pm - Pager - [phone]  After 6pm go to [email or web address] - password [email or web address] [facility]  Office  [phone]  CC: Primary care physician; Idelle Crouch, MD

## 2015-07-26 NOTE — Progress Notes (Signed)
Pt and family given d/c instructions r/t activity,diet, when to call MD, medications and activity, and follow up care, voiced understanding, out of facility dnr given to pt. Voiced understanding, d/c home via wheelchair escorted by auxillary

## 2015-07-26 NOTE — Care Management (Signed)
Admitted to [facility] with the diagnosis of TIA. Lives with family. Daughter is Amber [name] [phone]). Uses a cane to aid in ambulation. Takes care of all basic and instrumental activities of daily living herself. Dr.  Doy Hutching is listed as primary care physician. Family will transport.  Amber [name] has a pacemaker, unable to perform MRI.  CT head is negative. Shelbie Ammons  RN MSN CCM Care Management [phone]

## 2015-08-01 DIAGNOSIS — E119 Type 2 diabetes mellitus without complications: Secondary | ICD-10-CM | POA: Diagnosis not present

## 2015-08-01 DIAGNOSIS — D649 Anemia, unspecified: Secondary | ICD-10-CM | POA: Diagnosis not present

## 2015-08-01 DIAGNOSIS — G894 Chronic pain syndrome: Secondary | ICD-10-CM | POA: Diagnosis not present

## 2015-08-01 DIAGNOSIS — Z794 Long term (current) use of insulin: Secondary | ICD-10-CM | POA: Diagnosis not present

## 2015-08-01 DIAGNOSIS — G459 Transient cerebral ischemic attack, unspecified: Secondary | ICD-10-CM | POA: Diagnosis not present

## 2015-08-01 DIAGNOSIS — I495 Sick sinus syndrome: Secondary | ICD-10-CM | POA: Diagnosis not present

## 2015-08-22 ENCOUNTER — Emergency Department: Payer: Commercial Managed Care - HMO

## 2015-08-22 ENCOUNTER — Encounter: Payer: Self-pay | Admitting: *Deleted

## 2015-08-22 ENCOUNTER — Emergency Department
Admission: EM | Admit: 2015-08-22 | Discharge: 2015-08-22 | Disposition: A | Discharge status: Home / Self Care | Payer: Commercial Managed Care - HMO | Attending: Emergency Medicine | Admitting: Emergency Medicine

## 2015-08-22 DIAGNOSIS — Y998 Other external cause status: Secondary | ICD-10-CM | POA: Insufficient documentation

## 2015-08-22 DIAGNOSIS — N189 Chronic kidney disease, unspecified: Secondary | ICD-10-CM | POA: Insufficient documentation

## 2015-08-22 DIAGNOSIS — Z7951 Long term (current) use of inhaled steroids: Secondary | ICD-10-CM | POA: Insufficient documentation

## 2015-08-22 DIAGNOSIS — Y93G3 Activity, cooking and baking: Secondary | ICD-10-CM | POA: Insufficient documentation

## 2015-08-22 DIAGNOSIS — W1839XA Other fall on same level, initial encounter: Secondary | ICD-10-CM | POA: Diagnosis not present

## 2015-08-22 DIAGNOSIS — Z794 Long term (current) use of insulin: Secondary | ICD-10-CM | POA: Diagnosis not present

## 2015-08-22 DIAGNOSIS — S0003XA Contusion of scalp, initial encounter: Secondary | ICD-10-CM | POA: Diagnosis not present

## 2015-08-22 DIAGNOSIS — S4991XA Unspecified injury of right shoulder and upper arm, initial encounter: Secondary | ICD-10-CM | POA: Diagnosis not present

## 2015-08-22 DIAGNOSIS — Z79899 Other long term (current) drug therapy: Secondary | ICD-10-CM | POA: Insufficient documentation

## 2015-08-22 DIAGNOSIS — I129 Hypertensive chronic kidney disease with stage 1 through stage 4 chronic kidney disease, or unspecified chronic kidney disease: Secondary | ICD-10-CM | POA: Insufficient documentation

## 2015-08-22 DIAGNOSIS — Z880 Allergy status to penicillin: Secondary | ICD-10-CM | POA: Diagnosis not present

## 2015-08-22 DIAGNOSIS — E119 Type 2 diabetes mellitus without complications: Secondary | ICD-10-CM | POA: Diagnosis not present

## 2015-08-22 DIAGNOSIS — R55 Syncope and collapse: Secondary | ICD-10-CM

## 2015-08-22 DIAGNOSIS — Y92000 Kitchen of unspecified non-institutional (private) residence as  the place of occurrence of the external cause: Secondary | ICD-10-CM | POA: Diagnosis not present

## 2015-08-22 DIAGNOSIS — R079 Chest pain, unspecified: Secondary | ICD-10-CM | POA: Diagnosis not present

## 2015-08-22 LAB — URINALYSIS COMPLETE WITH MICROSCOPIC (ARMC ONLY)
BACTERIA UA: NONE SEEN
Bilirubin Urine: NEGATIVE
Glucose, UA: NEGATIVE mg/dL
HGB URINE DIPSTICK: NEGATIVE
Ketones, ur: NEGATIVE mg/dL
LEUKOCYTES UA: NEGATIVE
Nitrite: NEGATIVE
PH: 6 (ref 5.0–8.0)
PROTEIN: NEGATIVE mg/dL
Specific Gravity, Urine: 1.008 (ref 1.005–1.030)
WBC UA: NONE SEEN WBC/hpf (ref 0–5)

## 2015-08-22 LAB — CBC
HEMATOCRIT: 39.7 % (ref 35.0–47.0)
HEMOGLOBIN: 13 g/dL (ref 12.0–16.0)
MCH: 29.6 pg (ref 26.0–34.0)
MCHC: 32.7 g/dL (ref 32.0–36.0)
MCV: 90.3 fL (ref 80.0–100.0)
Platelets: 248 10*3/uL (ref 150–440)
RBC: 4.4 MIL/uL (ref 3.80–5.20)
RDW: 14.6 % — ABNORMAL HIGH (ref 11.5–14.5)
WBC: 7.5 10*3/uL (ref 3.6–11.0)

## 2015-08-22 LAB — BASIC METABOLIC PANEL
ANION GAP: 9 (ref 5–15)
BUN: 26 mg/dL — ABNORMAL HIGH (ref 6–20)
CALCIUM: 9.5 mg/dL (ref 8.9–10.3)
CO2: 24 mmol/L (ref 22–32)
Chloride: 102 mmol/L (ref 101–111)
Creatinine, Ser: 0.95 mg/dL (ref 0.44–1.00)
GFR calc non Af Amer: 54 mL/min — ABNORMAL LOW (ref >60)
GLUCOSE: 212 mg/dL — AB (ref 65–99)
POTASSIUM: 4 mmol/L (ref 3.5–5.1)
SODIUM: 135 mmol/L (ref 135–145)

## 2015-08-22 LAB — TROPONIN I

## 2015-08-22 NOTE — ED Notes (Signed)
Amber [name] with [employer] called to let us know that the patient pacemaker reading showed no arrhythmias noted for today and that her Right ventricular lead looks good. Unremarkable reading.

## 2015-08-22 NOTE — ED Notes (Signed)
States she was in the kitchen this AM making breakfast and had a syncopal episode, pt on elqiuis with hx of pacermaker, knot on the back of her head, pt awake and alert at triage, states some soreness in her shoulders

## 2015-08-22 NOTE — ED Provider Notes (Signed)
[facility] Emergency Department Provider Note  ____________________________________________  Time seen: Approximately 320 PM  I have reviewed the triage vital signs and the nursing notes.   HISTORY  Chief Complaint Loss of Consciousness    HPI Amber [name] is a 81 y.o. female with a history of congestive heart failure and sick sinus syndrome who is presenting today after syncopal episode. She said she hadn't eaten all day and was preparing lunch when she passed out. She says that she was standing in her kitchen and did not have any preceding symptoms. She denies any dizziness, lightheadedness, chest pain or shortness of breath. She does have a bruise to the back of her head. Is not claiming of any pain at this time. Says feels back to baseline. No history of syncope. Says the blood sugars today have all been over 100.Says that the event happened at 12:22 PM   Past Medical History  Diagnosis Date  . Hyperlipidemia   . Anemia   . Chronic pain   . Diabetes mellitus without complication ([place])   . Essential hypertension   . Cough   . Anxiety   . High risk medication use   . Benign neoplasm of colon   . Coronary artery disease with unspecified angina pectoris   . Plantar fascial fibromatosis   . Complication of anesthesia 1980's    hard time waking up  . Arthritis   . CHF (congestive heart failure) ([place])   . Presence of permanent cardiac pacemaker   . Shortness of breath dyspnea   . Chronic kidney disease     Patient Active Problem List   Diagnosis Date Noted  . TIA (transient ischemic attack) 07/25/2015  . Sick sinus syndrome ([place]) 03/14/2015  . Bradycardia 02/16/2015  . Chronic heart failure ([place]) 01/05/2015  . Fracture dislocation of ankle 07/27/2014  . IDDM (insulin dependent diabetes mellitus) ([place]) 07/27/2014  . Essential hypertension 07/27/2014  . Multiple rib fractures 07/27/2014  . 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 Total knee arthroplasty Right   . Orif ankle fracture Right 07/27/2014    Procedure: OPEN REDUCTION INTERNAL FIXATION (ORIF) ANKLE FRACTURE;  Surgeon: Alta Corning, MD;  Location: [place];  Service: Orthopedics;  Laterality: Right;  . Abdominal hysterectomy    . Coronary artery bypass graft  2008  . Total hip arthroplasty Right   . Rotator cuff repair    . Vein ligation and stripping    . Back surgery  1960's    cage and screws in lower back  . Joint replacement Right     hip and knee  . Cataract extraction w/ intraocular lens  implant, bilateral Bilateral   . Pacemaker insertion Left 03/14/2015    Procedure: INSERTION PACEMAKER;  Surgeon: Isaias Cowman, MD;  Location: [facility];  Service: Cardiovascular;  Laterality: Left;    Current Outpatient Rx  Name  Route  Sig  Dispense  Refill  . acetaminophen (TYLENOL) 500 MG tablet   Oral   Take 1,000 mg by mouth every 6 (six) hours as needed for mild pain or headache.         . alendronate (FOSAMAX) 70 MG tablet   Oral   Take 70 mg by mouth every Thursday.          . ALPRAZolam (XANAX) 1 MG tablet   Oral   Take 1 tablet (1 mg total) by mouth at bedtime as  needed for anxiety.   30 tablet   0   . amLODipine (NORVASC) 5 MG tablet   Oral   Take 5 mg by mouth daily.         Marland Kitchen apixaban (ELIQUIS) 5 MG TABS tablet   Oral   Take 5 mg by mouth 2 (two) times daily.         . Fluticasone-Salmeterol (ADVAIR) 250-50 MCG/DOSE AEPB   Inhalation   Inhale 1 puff into the lungs 2 (two) times daily.         Marland Kitchen gabapentin (NEURONTIN) 100 MG capsule   Oral   Take 200 mg by mouth 3 (three) times daily.          . insulin regular (NOVOLIN R,HUMULIN R) 100 units/mL injection   Subcutaneous   Inject 15-25 Units into the skin See admin instructions. 15-25 units every morning and 25 units at bedtime         . losartan (COZAAR) 100 MG tablet   Oral   Take 100 mg by mouth daily.          . Multiple Vitamins-Minerals (PRESERVISION AREDS 2) CAPS   Oral   Take 1 capsule by mouth daily.         Marland Kitchen omeprazole (PRILOSEC) 20 MG capsule   Oral   Take 20 mg by mouth daily.         Marland Kitchen oxyCODONE-acetaminophen (PERCOCET/ROXICET) 5-325 MG per tablet   Oral   Take 1 tablet by mouth every 6 (six) hours as needed for moderate pain.   30 tablet   0   . pravastatin (PRAVACHOL) 40 MG tablet   Oral   Take 40 mg by mouth at bedtime.         . furosemide (LASIX) 20 MG tablet   Oral   Take 1 tablet (20 mg total) by mouth daily. Patient not taking: Reported on 08/22/2015   10 tablet   0     Allergies Ambien; Etodolac; Iodine; Nsaids; Penicillins; and Succinylcholine  Family History  Problem Relation Age of Onset  . Heart attack Mother   . Heart disease Father   . Alzheimer's disease Sister   . Cervical cancer Sister   . Heart failure Son     Social History Social History  Substance Use Topics  . Smoking status: Never Smoker   . Smokeless tobacco: Never Used  . Alcohol Use: No    Review of Systems Constitutional: No fever/chills Eyes: No visual changes. ENT: No sore throat. Cardiovascular: Denies chest pain. Respiratory: Denies shortness of breath. Gastrointestinal: No abdominal pain.  No nausea, no vomiting.  No diarrhea.  No constipation. Genitourinary: Negative for dysuria. Musculoskeletal: Negative for back pain. Skin: Negative for rash. Neurological: Negative for headaches, focal weakness or numbness.  10-point ROS otherwise negative.  ____________________________________________   PHYSICAL EXAM:  VITAL SIGNS: ED Triage Vitals  Enc Vitals Group     BP 08/22/15 1400 150/72 mmHg     Pulse Rate 08/22/15 1400 64     Resp 08/22/15 1400 18     Temp 08/22/15 1400 98.1 F (36.7 C)     Temp src --      SpO2 08/22/15 1400 96 %     Weight 08/22/15 1400 170 lb (77.111 kg)     Height 08/22/15 1400 5\' 1"  (1.549 m)     Head Cir --      Peak Flow --       Pain Score 08/22/15 1401 4  Pain Loc --      Pain Edu? --      Excl. in [place]? --     Constitutional: Alert and oriented. Well appearing and in no acute distress. Eyes: Conjunctivae are normal. PERRL. EOMI. Head: 2x3cm occipital ecchymosis with only minimal tenderness. There is no depression or bogginess overlying. Nose: No congestion/rhinnorhea. Mouth/Throat: Mucous membranes are moist.  Oropharynx non-erythematous. Neck: No stridor.  Ranges freely. No tenderness. Cardiovascular: Normal rate, regular rhythm. Grossly normal heart sounds.  Good peripheral circulation. Respiratory: Normal respiratory effort.  No retractions. Lungs CTAB. Gastrointestinal: Soft and nontender. No distention. No abdominal bruits. No CVA tenderness. Musculoskeletal: No lower extremity tenderness nor edema.  No joint effusions. Neurologic:  Normal speech and language. No gross focal neurologic deficits are appreciated. No gait instability. Skin:  Skin is warm, dry and intact. No rash noted. Psychiatric: Mood and affect are normal. Speech and behavior are normal.  ____________________________________________   LABS (all labs ordered are listed, but only abnormal results are displayed)  Labs Reviewed  BASIC METABOLIC PANEL - Abnormal; Notable for the following:    Glucose, Bld 212 (*)    BUN 26 (*)    GFR calc non Af Amer 54 (*)    All other components within normal limits  CBC - Abnormal; Notable for the following:    RDW 14.6 (*)    All other components within normal limits  URINALYSIS COMPLETEWITH MICROSCOPIC ([facility] ONLY) - Abnormal; Notable for the following:    Color, Urine YELLOW (*)    APPearance CLEAR (*)    Squamous Epithelial / LPF 0-5 (*)    All other components within normal limits  TROPONIN I  TROPONIN I  CBG MONITORING, ED   ____________________________________________  EKG  ED ECG REPORT I, Doran Stabler, the attending physician, personally viewed and interpreted this  ECG.   Date: 08/22/2015  EKG Time: 1410  Rate: 64  Rhythm: Ventricular paced  Axis: Ocular paced  Intervals:Wide-complex QRS consistent with ventricular pacing  ST&T Change: T wave inversions in 1 and aVL which are unchanged from EKG of 07/25/2015. Nose no acute changes from EKG done last month on 07/25/2015.  ____________________________________________  RADIOLOGY  CT head with chronic white matter ischemic changes. No acute finding. Humerus x-ray without acute osseous findings. I personally reviewed the humerus x-ray and the chest x-ray.  Chest x-ray without acute cardiopulmonary disease.  ____________________________________________   PROCEDURES   ____________________________________________   INITIAL IMPRESSION / ASSESSMENT AND PLAN / ED COURSE  Pertinent labs & imaging results that were available during my care of the patient were reviewed by me and considered in my medical decision making (see chart for details).  ----------------------------------------- 7:33 PM on 08/22/2015 -----------------------------------------  Patient resting comfortably at this time. No further episodes of syncope. No pathologic arrhythmia on the monitor throughout her stay in the emergency department. Her pacemaker was interrogated by [employer] and no pathologic observations based on the interrogation were identified. Likely vasovagal episode. Will be discharged home. Counseled the patient to make sure not skipping meals and drink plenty of fluids. We discussed her labs as well as interrogation results. She'll be following up with her cardiologist, Dr. Nehemiah Massed. ____________________________________________   FINAL CLINICAL IMPRESSION(S) / ED DIAGNOSES  Syncope.    Orbie Pyo, MD 08/22/15 Joen Laura

## 2015-08-22 NOTE — Discharge Instructions (Signed)

## 2015-08-23 DIAGNOSIS — E782 Mixed hyperlipidemia: Secondary | ICD-10-CM | POA: Diagnosis not present

## 2015-08-23 DIAGNOSIS — D649 Anemia, unspecified: Secondary | ICD-10-CM | POA: Diagnosis not present

## 2015-08-23 DIAGNOSIS — G894 Chronic pain syndrome: Secondary | ICD-10-CM | POA: Diagnosis not present

## 2015-08-23 DIAGNOSIS — I2510 Atherosclerotic heart disease of native coronary artery without angina pectoris: Secondary | ICD-10-CM | POA: Diagnosis not present

## 2015-10-30 DIAGNOSIS — I10 Essential (primary) hypertension: Secondary | ICD-10-CM | POA: Diagnosis not present

## 2015-10-30 DIAGNOSIS — Z79899 Other long term (current) drug therapy: Secondary | ICD-10-CM | POA: Diagnosis not present

## 2015-10-30 DIAGNOSIS — E782 Mixed hyperlipidemia: Secondary | ICD-10-CM | POA: Diagnosis not present

## 2015-10-30 DIAGNOSIS — R05 Cough: Secondary | ICD-10-CM | POA: Diagnosis not present

## 2015-10-30 DIAGNOSIS — E119 Type 2 diabetes mellitus without complications: Secondary | ICD-10-CM | POA: Diagnosis not present

## 2015-10-30 DIAGNOSIS — Z794 Long term (current) use of insulin: Secondary | ICD-10-CM | POA: Diagnosis not present

## 2015-10-30 DIAGNOSIS — G894 Chronic pain syndrome: Secondary | ICD-10-CM | POA: Diagnosis not present

## 2015-10-30 DIAGNOSIS — R52 Pain, unspecified: Secondary | ICD-10-CM | POA: Diagnosis not present

## 2015-11-13 DIAGNOSIS — I495 Sick sinus syndrome: Secondary | ICD-10-CM | POA: Diagnosis not present

## 2015-12-06 DIAGNOSIS — L03313 Cellulitis of chest wall: Secondary | ICD-10-CM | POA: Diagnosis not present

## 2015-12-06 DIAGNOSIS — J441 Chronic obstructive pulmonary disease with (acute) exacerbation: Secondary | ICD-10-CM | POA: Diagnosis not present

## 2016-01-29 DIAGNOSIS — I495 Sick sinus syndrome: Secondary | ICD-10-CM | POA: Diagnosis not present

## 2016-01-29 DIAGNOSIS — I10 Essential (primary) hypertension: Secondary | ICD-10-CM | POA: Diagnosis not present

## 2016-01-29 DIAGNOSIS — Z794 Long term (current) use of insulin: Secondary | ICD-10-CM | POA: Diagnosis not present

## 2016-01-29 DIAGNOSIS — Z79899 Other long term (current) drug therapy: Secondary | ICD-10-CM | POA: Diagnosis not present

## 2016-01-29 DIAGNOSIS — R55 Syncope and collapse: Secondary | ICD-10-CM | POA: Diagnosis not present

## 2016-01-29 DIAGNOSIS — E119 Type 2 diabetes mellitus without complications: Secondary | ICD-10-CM | POA: Diagnosis not present

## 2016-01-29 DIAGNOSIS — E782 Mixed hyperlipidemia: Secondary | ICD-10-CM | POA: Diagnosis not present

## 2016-02-07 DIAGNOSIS — I25810 Atherosclerosis of coronary artery bypass graft(s) without angina pectoris: Secondary | ICD-10-CM | POA: Diagnosis not present

## 2016-02-07 DIAGNOSIS — I10 Essential (primary) hypertension: Secondary | ICD-10-CM | POA: Diagnosis not present

## 2016-02-07 DIAGNOSIS — R55 Syncope and collapse: Secondary | ICD-10-CM | POA: Diagnosis not present

## 2016-02-07 DIAGNOSIS — G451 Carotid artery syndrome (hemispheric): Secondary | ICD-10-CM | POA: Diagnosis not present

## 2016-02-22 DIAGNOSIS — R55 Syncope and collapse: Secondary | ICD-10-CM | POA: Diagnosis not present

## 2016-02-22 DIAGNOSIS — I25810 Atherosclerosis of coronary artery bypass graft(s) without angina pectoris: Secondary | ICD-10-CM | POA: Diagnosis not present

## 2016-02-22 DIAGNOSIS — G451 Carotid artery syndrome (hemispheric): Secondary | ICD-10-CM | POA: Diagnosis not present

## 2016-03-09 ENCOUNTER — Emergency Department: Payer: Commercial Managed Care - HMO

## 2016-03-09 ENCOUNTER — Emergency Department
Admission: EM | Admit: 2016-03-09 | Discharge: 2016-03-09 | Disposition: A | Discharge status: Home / Self Care | Payer: Commercial Managed Care - HMO | Attending: Student in an Organized Health Care Education/Training Program | Admitting: Student in an Organized Health Care Education/Training Program

## 2016-03-09 DIAGNOSIS — E1122 Type 2 diabetes mellitus with diabetic chronic kidney disease: Secondary | ICD-10-CM | POA: Insufficient documentation

## 2016-03-09 DIAGNOSIS — N189 Chronic kidney disease, unspecified: Secondary | ICD-10-CM | POA: Diagnosis not present

## 2016-03-09 DIAGNOSIS — Z950 Presence of cardiac pacemaker: Secondary | ICD-10-CM | POA: Insufficient documentation

## 2016-03-09 DIAGNOSIS — M436 Torticollis: Secondary | ICD-10-CM | POA: Diagnosis not present

## 2016-03-09 DIAGNOSIS — I509 Heart failure, unspecified: Secondary | ICD-10-CM | POA: Diagnosis not present

## 2016-03-09 DIAGNOSIS — M542 Cervicalgia: Secondary | ICD-10-CM | POA: Diagnosis not present

## 2016-03-09 DIAGNOSIS — Z794 Long term (current) use of insulin: Secondary | ICD-10-CM | POA: Diagnosis not present

## 2016-03-09 DIAGNOSIS — Z79899 Other long term (current) drug therapy: Secondary | ICD-10-CM | POA: Insufficient documentation

## 2016-03-09 DIAGNOSIS — I25119 Atherosclerotic heart disease of native coronary artery with unspecified angina pectoris: Secondary | ICD-10-CM | POA: Diagnosis not present

## 2016-03-09 DIAGNOSIS — I130 Hypertensive heart and chronic kidney disease with heart failure and stage 1 through stage 4 chronic kidney disease, or unspecified chronic kidney disease: Secondary | ICD-10-CM | POA: Diagnosis not present

## 2016-03-09 MED ORDER — PREDNISONE 10 MG PO TABS
10.0000 mg | ORAL_TABLET | Freq: Once | ORAL | Status: DC
  Filled 2016-03-09: qty 1

## 2016-03-09 MED ORDER — TRAMADOL HCL 50 MG PO TABS
50.0000 mg | ORAL_TABLET | Freq: Once | ORAL | Status: AC
  Administered 2016-03-09: 50 mg via ORAL
  Filled 2016-03-09: qty 1

## 2016-03-09 MED ORDER — TRAMADOL HCL 50 MG PO TABS: 50.0000 mg | ORAL_TABLET | Freq: Three times a day (TID) | ORAL | 0 refills | Status: DC | PRN

## 2016-03-09 NOTE — ED Triage Notes (Signed)
Pt presents to ED with c/o cough x 1 year but worsened over the past few days. Pt states she feels congestion in her chest and tries to cough up phlegm but is unable to. Pt states she started having pain in her neck to right side as well when coughing and has pain when moving her neck to the right side.  Pt denies any shortness of breath or chest pain.  Denies any fevers.

## 2016-03-09 NOTE — Discharge Instructions (Signed)
Take the prescription pain medicine as needed. Dose Tylenol as needed for non-drowsy pain relief. Follow-up with Dr. Doy Hutching for continued pain management. Apply moist heat to the neck for pain relief.

## 2016-03-09 NOTE — ED Provider Notes (Signed)
[facility] Emergency Department Provider Note ____________________________________________  Time seen: 1217  I have reviewed the triage vital signs and the nursing notes.  HISTORY  Chief Complaint  Cough and Neck Pain  HPI Amber [name] is a 82 y.o. female sensitivity ED accompanied by her adult granddaughter forevaluation of sudden onset of right-sided posterior lateral neck pain upon awakening yesterday. The patient denies any preceding injury, accident, trauma, or fall. She reports pain when she leans to the right. She describes intermittent K[place], spasm or muscle pain to the back of the neck. She denies any upper extremity paresthesias, changes, or swelling. She has taken Tylenol with limited benefit.  Past Medical History:  Diagnosis Date  . Anemia   . Anxiety   . Arthritis   . Benign neoplasm of colon   . CHF (congestive heart failure) ([place])   . Chronic kidney disease   . Chronic pain   . Complication of anesthesia 1980's   hard time waking up  . Coronary artery disease with unspecified angina pectoris   . Cough   . Diabetes mellitus without complication ([place])   . Essential hypertension   . High risk medication use   . Hyperlipidemia   . Plantar fascial fibromatosis   . Presence of permanent cardiac pacemaker   . Shortness of breath dyspnea     Patient Active Problem List   Diagnosis Date Noted  . TIA (transient ischemic attack) 07/25/2015  . Sick sinus syndrome ([place]) 03/14/2015  . Bradycardia 02/16/2015  . Chronic heart failure ([place]) 01/05/2015  . Fracture dislocation of ankle 07/27/2014  . IDDM (insulin dependent diabetes mellitus) ([place]) 07/27/2014  . Essential hypertension 07/27/2014  . Multiple rib fractures 07/27/2014  . 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 ABDOMINAL HYSTERECTOMY    . BACK SURGERY  1960's   cage and screws in lower back  . CATARACT EXTRACTION W/  INTRAOCULAR LENS  IMPLANT, BILATERAL Bilateral   . CORONARY ARTERY BYPASS GRAFT  2008  . JOINT REPLACEMENT Right    hip and knee  . ORIF ANKLE FRACTURE Right 07/27/2014   Procedure: OPEN REDUCTION INTERNAL FIXATION (ORIF) ANKLE FRACTURE;  Surgeon: Alta Corning, MD;  Location: [place];  Service: Orthopedics;  Laterality: Right;  . PACEMAKER INSERTION Left 03/14/2015   Procedure: INSERTION PACEMAKER;  Surgeon: Isaias Cowman, MD;  Location: [facility];  Service: Cardiovascular;  Laterality: Left;  . ROTATOR CUFF REPAIR    . TOTAL HIP ARTHROPLASTY Right   . TOTAL KNEE ARTHROPLASTY Right   . VEIN LIGATION AND STRIPPING      Prior to Admission medications   Medication Sig Start Date End Date Taking? Authorizing Provider  acetaminophen (TYLENOL) 500 MG tablet Take 1,000 mg by mouth every 6 (six) hours as needed for mild pain or headache.    Historical Provider, MD  alendronate (FOSAMAX) 70 MG tablet Take 70 mg by mouth every Thursday.     Historical Provider, MD  ALPRAZolam Duanne Moron) 1 MG tablet Take 1 tablet (1 mg total) by mouth at bedtime as needed for anxiety. 07/31/14   Silver Huguenin Elgergawy, MD  amLODipine (NORVASC) 5 MG tablet Take 5 mg by mouth daily.    Historical Provider, MD  apixaban (ELIQUIS) 5 MG TABS tablet Take 5 mg by mouth 2 (two) times daily.    Historical Provider, MD  Fluticasone-Salmeterol (ADVAIR) 250-50 MCG/DOSE AEPB Inhale 1 puff into the lungs 2 (two) times  daily.    Historical Provider, MD  furosemide (LASIX) 20 MG tablet Take 1 tablet (20 mg total) by mouth daily. Patient not taking: Reported on 08/22/2015 12/25/14 12/25/15  Nance Pear, MD  gabapentin (NEURONTIN) 100 MG capsule Take 200 mg by mouth 3 (three) times daily.     Historical Provider, MD  insulin regular (NOVOLIN R,HUMULIN R) 100 units/mL injection Inject 15-25 Units into the skin See admin instructions. 15-25 units every morning and 25 units at bedtime    Historical Provider, MD  losartan (COZAAR) 100 MG tablet  Take 100 mg by mouth daily.    Historical Provider, MD  Multiple Vitamins-Minerals (PRESERVISION AREDS 2) CAPS Take 1 capsule by mouth daily.    Historical Provider, MD  omeprazole (PRILOSEC) 20 MG capsule Take 20 mg by mouth daily.    Historical Provider, MD  oxyCODONE-acetaminophen (PERCOCET/ROXICET) 5-325 MG per tablet Take 1 tablet by mouth every 6 (six) hours as needed for moderate pain. 07/31/14   Silver Huguenin Elgergawy, MD  pravastatin (PRAVACHOL) 40 MG tablet Take 40 mg by mouth at bedtime.    Historical Provider, MD  traMADol (ULTRAM) 50 MG tablet Take 1 tablet (50 mg total) by mouth 3 (three) times daily as needed. 03/09/16   Franki Alcaide V Bacon Mikisha Roseland, PA-C    Allergies Ambien [zolpidem tartrate]; Etodolac; Iodine; Nsaids; Penicillins; and Succinylcholine  Family History  Problem Relation Age of Onset  . Heart attack Mother   . Heart disease Father   . Alzheimer's disease Sister   . Cervical cancer Sister   . Heart failure Son     Social History Social History  Substance Use Topics  . Smoking status: Never Smoker  . Smokeless tobacco: Never Used  . Alcohol use No    Review of Systems  Constitutional: Negative for fever. Eyes: Negative for visual changes. ENT: Negative for sore throat. Cardiovascular: Negative for chest pain. Respiratory: Negative for shortness of breath. Gastrointestinal: Negative for abdominal pain, vomiting and diarrhea. Musculoskeletal: Negative for back pain.Right-sided neck pain as above. Skin: Negative for rash. Neurological: Negative for headaches, focal weakness or numbness. ____________________________________________  PHYSICAL EXAM:  VITAL SIGNS: ED Triage Vitals  Enc Vitals Group     BP 03/09/16 1131 (!) 161/77     Pulse Rate 03/09/16 1131 79     Resp 03/09/16 1131 20     Temp 03/09/16 1131 97.9 F (36.6 C)     Temp Source 03/09/16 1131 Oral     SpO2 03/09/16 1131 98 %     Weight 03/09/16 1132 180 lb (81.6 kg)     Height 03/09/16 1132  5\' 1"  (1.549 m)     Head Circumference --      Peak Flow --      Pain Score 03/09/16 1133 7     Pain Loc --      Pain Edu? --      Excl. in [place]? --    Constitutional: Alert and oriented. Well appearing and in no distress. Head: Normocephalic and atraumatic. Neck: Supple. No thyromegaly.  Hematological/Lymphatic/Immunological: No cervical lymphadenopathy. Cardiovascular: Normal rate, regular rhythm.  Respiratory: Normal respiratory effort. No wheezes/rales/rhonchi. Gastrointestinal: Soft and nontender. No distention. Musculoskeletal: Normal spinal lamina without midline tenderness, spasm, deformity, or step-off. Patient with minimal tenderness to palp over the right upper trapezius and SCM. I'll composite fist.  Nontender with normal range of motion in all extremities.  Neurologic:  Cranial nerves II through XII grossly intact. Normal UE DTRs bilaterally. Normal gait without  ataxia. Normal speech and language. No gross focal neurologic deficits are appreciated. Skin:  Skin is warm, dry and intact. No rash noted. Psychiatric: Mood and affect are normal. Patient exhibits appropriate insight and judgment. ___________________________________________   RADIOLOGY  Cervical Spine IMPRESSION: 1. Cervical spondylosis. 2. Stable 3 mm anterolisthesis C4 on C5. 3.  No evidence for acute  abnormality.  I, Corissa Oguinn, Dannielle Karvonen, personally viewed and evaluated these images (plain radiographs) as part of my medical decision making, as well as reviewing the written report by the radiologist. ____________________________________________  PROCEDURES  Ultram 50 mg PO ____________________________________________  INITIAL IMPRESSION / ASSESSMENT AND PLAN / ED COURSE  Patient with an acute myofascial strain and muscle spasm to the right neck. Her exam is otherwise unremarkable and cervical spine imaging reveals no acute etiology. Patient will be discharged with a prescription for Ultram dose as  needed for pain relief. She may continue to dose Tylenol for non-drowsy pain relief intermittently. She should apply warm compresses to the neck for comfort and follow up with her primary care provider for ongoing symptom management.  Clinical Course   ____________________________________________  FINAL CLINICAL IMPRESSION(S) / ED DIAGNOSES  Final diagnoses:  Neck pain  Torticollis      Melvenia Needles, PA-C 03/09/16 [place], MD 03/09/16 [phone]

## 2016-03-14 DIAGNOSIS — I10 Essential (primary) hypertension: Secondary | ICD-10-CM | POA: Diagnosis not present

## 2016-03-14 DIAGNOSIS — I25810 Atherosclerosis of coronary artery bypass graft(s) without angina pectoris: Secondary | ICD-10-CM | POA: Diagnosis not present

## 2016-03-14 DIAGNOSIS — R05 Cough: Secondary | ICD-10-CM | POA: Diagnosis not present

## 2016-03-14 DIAGNOSIS — Z950 Presence of cardiac pacemaker: Secondary | ICD-10-CM | POA: Diagnosis not present

## 2016-03-27 DIAGNOSIS — E119 Type 2 diabetes mellitus without complications: Secondary | ICD-10-CM | POA: Diagnosis not present

## 2016-03-27 DIAGNOSIS — Z794 Long term (current) use of insulin: Secondary | ICD-10-CM | POA: Diagnosis not present

## 2016-03-27 DIAGNOSIS — I10 Essential (primary) hypertension: Secondary | ICD-10-CM | POA: Diagnosis not present

## 2016-03-27 DIAGNOSIS — E782 Mixed hyperlipidemia: Secondary | ICD-10-CM | POA: Diagnosis not present

## 2016-03-27 DIAGNOSIS — I25810 Atherosclerosis of coronary artery bypass graft(s) without angina pectoris: Secondary | ICD-10-CM | POA: Diagnosis not present

## 2016-03-27 DIAGNOSIS — I495 Sick sinus syndrome: Secondary | ICD-10-CM | POA: Diagnosis not present

## 2016-03-27 DIAGNOSIS — Z950 Presence of cardiac pacemaker: Secondary | ICD-10-CM | POA: Diagnosis not present

## 2016-04-30 DIAGNOSIS — G629 Polyneuropathy, unspecified: Secondary | ICD-10-CM | POA: Diagnosis not present

## 2016-04-30 DIAGNOSIS — Z794 Long term (current) use of insulin: Secondary | ICD-10-CM | POA: Diagnosis not present

## 2016-04-30 DIAGNOSIS — F411 Generalized anxiety disorder: Secondary | ICD-10-CM | POA: Diagnosis not present

## 2016-04-30 DIAGNOSIS — G894 Chronic pain syndrome: Secondary | ICD-10-CM | POA: Diagnosis not present

## 2016-04-30 DIAGNOSIS — E119 Type 2 diabetes mellitus without complications: Secondary | ICD-10-CM | POA: Diagnosis not present

## 2016-05-09 ENCOUNTER — Encounter: Payer: Self-pay | Admitting: Emergency Medicine

## 2016-05-09 ENCOUNTER — Ambulatory Visit
Admission: EM | Admit: 2016-05-09 | Discharge: 2016-05-09 | Disposition: A | Discharge status: Home / Self Care | Payer: Commercial Managed Care - HMO | Attending: Family Medicine | Admitting: Family Medicine

## 2016-05-09 DIAGNOSIS — M545 Low back pain, unspecified: Secondary | ICD-10-CM

## 2016-05-09 DIAGNOSIS — M25551 Pain in right hip: Secondary | ICD-10-CM | POA: Diagnosis not present

## 2016-05-09 MED ORDER — HYDROCODONE-ACETAMINOPHEN 5-325 MG PO TABS: ORAL_TABLET | ORAL | 0 refills | Status: DC

## 2016-05-09 NOTE — ED Triage Notes (Signed)
Pt reports right hip pain ongoing over the last week reports Hx of hip replacement years ago after sciatic nerve problem.

## 2016-05-09 NOTE — ED Provider Notes (Signed)
[facility]    CSN: [id number] Arrival date & time: 05/09/16  0844     History   Chief Complaint Chief Complaint  Patient presents with  . Hip Pain    HPI Amber [name] is a 82 y.o. female.   HPI  Past Medical History:  Diagnosis Date  . Anemia   . Anxiety   . Arthritis   . Benign neoplasm of colon   . CHF (congestive heart failure) ([place])   . Chronic kidney disease   . Chronic pain   . Complication of anesthesia 1980's   hard time waking up  . Coronary artery disease with unspecified angina pectoris   . Cough   . Diabetes mellitus without complication ([place])   . Essential hypertension   . High risk medication use   . Hyperlipidemia   . Plantar fascial fibromatosis   . Presence of permanent cardiac pacemaker   . Shortness of breath dyspnea     Patient Active Problem List   Diagnosis Date Noted  . TIA (transient ischemic attack) 07/25/2015  . Sick sinus syndrome ([place]) 03/14/2015  . Bradycardia 02/16/2015  . Chronic heart failure ([place]) 01/05/2015  . Fracture dislocation of ankle 07/27/2014  . IDDM (insulin dependent diabetes mellitus) ([place]) 07/27/2014  . Essential hypertension 07/27/2014  . Multiple rib fractures 07/27/2014  . 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 ABDOMINAL HYSTERECTOMY    . BACK SURGERY  1960's   cage and screws in lower back  . CATARACT EXTRACTION W/ INTRAOCULAR LENS  IMPLANT, BILATERAL Bilateral   . CORONARY ARTERY BYPASS GRAFT  2008  . JOINT REPLACEMENT Right    hip and knee  . ORIF ANKLE FRACTURE Right 07/27/2014   Procedure: OPEN REDUCTION INTERNAL FIXATION (ORIF) ANKLE FRACTURE;  Surgeon: Alta Corning, MD;  Location: [facility];  Service: Orthopedics;  Laterality: Right;  . PACEMAKER INSERTION Left 03/14/2015   Procedure: INSERTION PACEMAKER;  Surgeon: Isaias Cowman, MD;  Location: [facility];  Service: Cardiovascular;  Laterality: Left;  . ROTATOR CUFF REPAIR      . TOTAL HIP ARTHROPLASTY Right   . TOTAL KNEE ARTHROPLASTY Right   . VEIN LIGATION AND STRIPPING      OB History    No data available       Home Medications    Prior to Admission medications   Medication Sig Start Date End Date Taking? Authorizing Provider  acetaminophen (TYLENOL) 500 MG tablet Take 1,000 mg by mouth every 6 (six) hours as needed for mild pain or headache.    Historical Provider, MD  alendronate (FOSAMAX) 70 MG tablet Take 70 mg by mouth every Thursday.     Historical Provider, MD  ALPRAZolam Duanne Moron) 1 MG tablet Take 1 tablet (1 mg total) by mouth at bedtime as needed for anxiety. 07/31/14   Silver Huguenin Elgergawy, MD  amLODipine (NORVASC) 5 MG tablet Take 5 mg by mouth daily.    Historical Provider, MD  apixaban (ELIQUIS) 5 MG TABS tablet Take 5 mg by mouth 2 (two) times daily.    Historical Provider, MD  Fluticasone-Salmeterol (ADVAIR) 250-50 MCG/DOSE AEPB Inhale 1 puff into the lungs 2 (two) times daily.    Historical Provider, MD  furosemide (LASIX) 20 MG tablet Take 1 tablet (20 mg total) by mouth daily. Patient not taking: Reported on 08/22/2015 12/25/14 12/25/15  Nance Pear, MD  gabapentin (NEURONTIN) 100 MG capsule Take 200 mg by mouth  3 (three) times daily.     Historical Provider, MD  HYDROcodone-acetaminophen (NORCO/VICODIN) 5-325 MG tablet 1-2 tabs po qhs prn 05/09/16   Norval Gable, MD  insulin regular (NOVOLIN R,HUMULIN R) 100 units/mL injection Inject 15-25 Units into the skin See admin instructions. 15-25 units every morning and 25 units at bedtime    Historical Provider, MD  losartan (COZAAR) 100 MG tablet Take 100 mg by mouth daily.    Historical Provider, MD  Multiple Vitamins-Minerals (PRESERVISION AREDS 2) CAPS Take 1 capsule by mouth daily.    Historical Provider, MD  omeprazole (PRILOSEC) 20 MG capsule Take 20 mg by mouth daily.    Historical Provider, MD  oxyCODONE-acetaminophen (PERCOCET/ROXICET) 5-325 MG per tablet Take 1 tablet by mouth every 6  (six) hours as needed for moderate pain. 07/31/14   Silver Huguenin Elgergawy, MD  pravastatin (PRAVACHOL) 40 MG tablet Take 40 mg by mouth at bedtime.    Historical Provider, MD  traMADol (ULTRAM) 50 MG tablet Take 1 tablet (50 mg total) by mouth 3 (three) times daily as needed. 03/09/16   Jenise Dyanne Carrel, PA-C    Family History Family History  Problem Relation Age of [facility]  . Heart attack Mother   . Heart disease Father   . Alzheimer's disease Sister   . Cervical cancer Sister   . Heart failure Son     Social History Social History  Substance Use Topics  . Smoking status: Never Smoker  . Smokeless tobacco: Never Used  . Alcohol use No     Allergies   Ambien [zolpidem tartrate]; Etodolac; Iodine; Nsaids; Penicillins; and Succinylcholine   Review of Systems Review of Systems   Physical Exam Triage Vital Signs ED Triage Vitals  Enc Vitals Group     BP 05/09/16 0907 (!) 142/92     Pulse Rate 05/09/16 0907 64     Resp 05/09/16 0907 18     Temp 05/09/16 0907 97.9 F (36.6 C)     Temp Source 05/09/16 0907 Tympanic     SpO2 05/09/16 0907 97 %     Weight 05/09/16 0909 180 lb (81.6 kg)     Height 05/09/16 0909 5' (1.524 m)     Head Circumference --      Peak Flow --      Pain Score 05/09/16 0911 10     Pain Loc --      Pain Edu? --      Excl. in [place]? --    No data found.   Updated Vital Signs BP (!) 142/92 (BP Location: Right Arm)   Pulse 64   Temp 97.9 F (36.6 C) (Tympanic)   Resp 18   Ht 5' (1.524 m)   Wt 180 lb (81.6 kg)   SpO2 97%   BMI 35.15 kg/m   Visual Acuity Right Eye Distance:   Left Eye Distance:   Bilateral Distance:    Right Eye Near:   Left Eye Near:    Bilateral Near:     Physical Exam  Constitutional: She appears well-developed and well-nourished. No distress.  Musculoskeletal: She exhibits tenderness. She exhibits no edema.       Lumbar back: She exhibits tenderness and spasm. She exhibits normal range of motion, no bony tenderness,  no swelling, no edema, no deformity, no laceration, no pain and normal pulse.       Back:  Neurological: She is alert. She has normal reflexes. She displays normal reflexes. She exhibits normal muscle tone.  Skin: Skin is warm and dry. No rash noted. She is not diaphoretic. No erythema.  Nursing note and vitals reviewed.    UC Treatments / Results  Labs (all labs ordered are listed, but only abnormal results are displayed) Labs Reviewed - No data to display  EKG  EKG Interpretation None       Radiology No results found.  Procedures Procedures (including critical care time)  Medications Ordered in UC Medications - No data to display   Initial Impression / Assessment and Plan / UC Course  I have reviewed the triage vital signs and the nursing notes.  Pertinent labs & imaging results that were available during my care of the patient were reviewed by me and considered in my medical decision making (see chart for details).  Clinical Course      Final Clinical Impressions(s) / UC Diagnoses   Final diagnoses:  Right hip pain  Acute low back pain without sciatica, unspecified back pain laterality    New Prescriptions Discharge Medication List as of 05/09/2016  9:54 AM    START taking these medications   Details  HYDROcodone-acetaminophen (NORCO/VICODIN) 5-325 MG tablet 1-2 tabs po qhs prn, Print       1.diagnosis reviewed with patient 2. rx as per orders above; reviewed possible side effects, interactions, risks and benefits  3. Recommend supportive treatment with heat, otc analgesics, stretching 4. Follow-up prn if symptoms worsen or don't improve   Norval Gable, MD 05/09/16 1206

## 2016-05-13 DIAGNOSIS — I495 Sick sinus syndrome: Secondary | ICD-10-CM | POA: Diagnosis not present

## 2016-06-06 ENCOUNTER — Emergency Department: Payer: Commercial Managed Care - HMO

## 2016-06-06 ENCOUNTER — Emergency Department
Admission: EM | Admit: 2016-06-06 | Discharge: 2016-06-06 | Disposition: A | Discharge status: Home / Self Care | Payer: Commercial Managed Care - HMO | Attending: Emergency Medicine | Admitting: Emergency Medicine

## 2016-06-06 ENCOUNTER — Encounter: Payer: Self-pay | Admitting: Emergency Medicine

## 2016-06-06 DIAGNOSIS — Z951 Presence of aortocoronary bypass graft: Secondary | ICD-10-CM | POA: Diagnosis not present

## 2016-06-06 DIAGNOSIS — I2510 Atherosclerotic heart disease of native coronary artery without angina pectoris: Secondary | ICD-10-CM | POA: Insufficient documentation

## 2016-06-06 DIAGNOSIS — Z794 Long term (current) use of insulin: Secondary | ICD-10-CM | POA: Diagnosis not present

## 2016-06-06 DIAGNOSIS — M25532 Pain in left wrist: Secondary | ICD-10-CM | POA: Diagnosis not present

## 2016-06-06 DIAGNOSIS — I509 Heart failure, unspecified: Secondary | ICD-10-CM | POA: Insufficient documentation

## 2016-06-06 DIAGNOSIS — Z950 Presence of cardiac pacemaker: Secondary | ICD-10-CM | POA: Insufficient documentation

## 2016-06-06 DIAGNOSIS — Z85038 Personal history of other malignant neoplasm of large intestine: Secondary | ICD-10-CM | POA: Diagnosis not present

## 2016-06-06 DIAGNOSIS — Z79899 Other long term (current) drug therapy: Secondary | ICD-10-CM | POA: Diagnosis not present

## 2016-06-06 DIAGNOSIS — N189 Chronic kidney disease, unspecified: Secondary | ICD-10-CM | POA: Insufficient documentation

## 2016-06-06 DIAGNOSIS — E1122 Type 2 diabetes mellitus with diabetic chronic kidney disease: Secondary | ICD-10-CM | POA: Insufficient documentation

## 2016-06-06 DIAGNOSIS — I130 Hypertensive heart and chronic kidney disease with heart failure and stage 1 through stage 4 chronic kidney disease, or unspecified chronic kidney disease: Secondary | ICD-10-CM | POA: Insufficient documentation

## 2016-06-06 DIAGNOSIS — M19032 Primary osteoarthritis, left wrist: Secondary | ICD-10-CM

## 2016-06-06 DIAGNOSIS — S6992XA Unspecified injury of left wrist, hand and finger(s), initial encounter: Secondary | ICD-10-CM | POA: Diagnosis not present

## 2016-06-06 MED ORDER — TRAMADOL HCL 50 MG PO TABS: 50.0000 mg | ORAL_TABLET | Freq: Three times a day (TID) | ORAL | 0 refills | Status: DC | PRN

## 2016-06-06 MED ORDER — TRAMADOL HCL 50 MG PO TABS
50.0000 mg | ORAL_TABLET | Freq: Once | ORAL | Status: AC
  Administered 2016-06-06: 50 mg via ORAL
  Filled 2016-06-06: qty 1

## 2016-06-06 NOTE — ED Provider Notes (Signed)
[name] Emergency Department Provider Note ____________________________________________  Time seen: 1243  I have reviewed the triage vital signs and the nursing notes.  HISTORY  Chief Complaint  Arm Pain  HPI Amber [name] is a 82 y.o. female presents to the ED for evaluation of left arm pain.Patient with a history of arthritis of the wrist and remote history of a left humeral fracture with pin placement, describes pain over the last week without known injury, accident, trauma. She denies any distal paresthesias, swelling, warmth or edema to the hand. Prescription pain is worse with movement.  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 She has not taking any medication in the interim for pain relief other than Tylenol.  Past Medical History:  Diagnosis Date  . Anemia   . Anxiety   . Arthritis   . Benign neoplasm of colon   . CHF (congestive heart failure) ([place])   . Chronic kidney disease   . Chronic pain   . Complication of anesthesia 1980's   hard time waking up  . Coronary artery disease with unspecified angina pectoris   . Cough   . Diabetes mellitus without complication ([place])   . Essential hypertension   . High risk medication use   . Hyperlipidemia   . Plantar fascial fibromatosis   . Presence of permanent cardiac pacemaker   . Shortness of breath dyspnea     Patient Active Problem List   Diagnosis Date Noted  . TIA (transient ischemic attack) 07/25/2015  . Sick sinus syndrome ([place]) 03/14/2015  . Bradycardia 02/16/2015  . Chronic heart failure ([place]) 01/05/2015  . Fracture dislocation of ankle 07/27/2014  . IDDM (insulin dependent diabetes mellitus) ([place]) 07/27/2014  . Essential hypertension 07/27/2014  . Multiple rib fractures 07/27/2014  . 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 ABDOMINAL HYSTERECTOMY    . BACK SURGERY  1960's   cage and  screws in lower back  . CATARACT EXTRACTION W/ INTRAOCULAR LENS  IMPLANT, BILATERAL Bilateral   . CORONARY ARTERY BYPASS GRAFT  2008  . JOINT REPLACEMENT Right    hip and knee  . ORIF ANKLE FRACTURE Right 07/27/2014   Procedure: OPEN REDUCTION INTERNAL FIXATION (ORIF) ANKLE FRACTURE;  Surgeon: Alta Corning, MD;  Location: [facility];  Service: Orthopedics;  Laterality: Right;  . PACEMAKER INSERTION Left 03/14/2015   Procedure: INSERTION PACEMAKER;  Surgeon: Isaias Cowman, MD;  Location: [facility];  Service: Cardiovascular;  Laterality: Left;  . ROTATOR CUFF REPAIR    . TOTAL HIP ARTHROPLASTY Right   . TOTAL KNEE ARTHROPLASTY Right   . VEIN LIGATION AND STRIPPING      Prior to Admission medications   Medication Sig Start Date End Date Taking? Authorizing Provider  acetaminophen (TYLENOL) 500 MG tablet Take 1,000 mg by mouth every 6 (six) hours as needed for mild pain or headache.    Historical Provider, MD  alendronate (FOSAMAX) 70 MG tablet Take 70 mg by mouth every Thursday.     Historical Provider, MD  ALPRAZolam Duanne Moron) 1 MG tablet Take 1 tablet (1 mg total) by mouth at bedtime as needed for anxiety. 07/31/14   Silver Huguenin Elgergawy, MD  amLODipine (NORVASC) 5 MG tablet Take 5 mg by mouth daily.    Historical Provider, MD  apixaban (ELIQUIS) 5 MG TABS tablet Take 5 mg by mouth 2 (two) times daily.    Historical Provider, MD  Fluticasone-Salmeterol (  ADVAIR) 250-50 MCG/DOSE AEPB Inhale 1 puff into the lungs 2 (two) times daily.    Historical Provider, MD  furosemide (LASIX) 20 MG tablet Take 1 tablet (20 mg total) by mouth daily. Patient not taking: Reported on 08/22/2015 12/25/14 12/25/15  Nance Pear, MD  gabapentin (NEURONTIN) 100 MG capsule Take 200 mg by mouth 3 (three) times daily.     Historical Provider, MD  HYDROcodone-acetaminophen (NORCO/VICODIN) 5-325 MG tablet 1-2 tabs po qhs prn 05/09/16   Norval Gable, MD  insulin regular (NOVOLIN R,HUMULIN R) 100 units/mL injection Inject 15-25  Units into the skin See admin instructions. 15-25 units every morning and 25 units at bedtime    Historical Provider, MD  losartan (COZAAR) 100 MG tablet Take 100 mg by mouth daily.    Historical Provider, MD  Multiple Vitamins-Minerals (PRESERVISION AREDS 2) CAPS Take 1 capsule by mouth daily.    Historical Provider, MD  omeprazole (PRILOSEC) 20 MG capsule Take 20 mg by mouth daily.    Historical Provider, MD  oxyCODONE-acetaminophen (PERCOCET/ROXICET) 5-325 MG per tablet Take 1 tablet by mouth every 6 (six) hours as needed for moderate pain. 07/31/14   Silver Huguenin Elgergawy, MD  pravastatin (PRAVACHOL) 40 MG tablet Take 40 mg by mouth at bedtime.    Historical Provider, MD  traMADol (ULTRAM) 50 MG tablet Take 1 tablet (50 mg total) by mouth 3 (three) times daily as needed. 06/06/16   Lydon Vansickle V Bacon Merrin Mcvicker, PA-C    Allergies Ambien [zolpidem tartrate]; Etodolac; Iodine; Nsaids; Penicillins; and Succinylcholine  Family History  Problem Relation Age of [facility]  . Heart attack Mother   . Heart disease Father   . Alzheimer's disease Sister   . Cervical cancer Sister   . Heart failure Son     Social History Social History  Substance Use Topics  . Smoking status: Never Smoker  . Smokeless tobacco: Never Used  . Alcohol use No    Review of Systems  Constitutional: Negative for fever. Cardiovascular: Negative for chest pain. Respiratory: Negative for shortness of breath. Musculoskeletal: Negative for back pain. Left arm pain as above  Skin: Negative for rash. Neurological: Negative for headaches, focal weakness or numbness. ____________________________________________  PHYSICAL EXAM:  VITAL SIGNS: ED Triage Vitals  Enc Vitals Group     BP 06/06/16 1133 (!) 157/72     Pulse Rate 06/06/16 1133 60     Resp 06/06/16 1133 18     Temp 06/06/16 1133 98 F (36.7 C)     Temp Source 06/06/16 1133 Oral     SpO2 06/06/16 1133 97 %     Weight 06/06/16 1131 180 lb (81.6 kg)     Height  06/06/16 1131 5\' 1"  (1.549 m)     Head Circumference --      Peak Flow --      Pain Score 06/06/16 1131 5     Pain Loc --      Pain Edu? --      Excl. in [place]? --    Constitutional: Alert and oriented. Well appearing and in no distress. Head: Normocephalic and atraumatic. Cardiovascular: Normal rate, regular rhythm. Normal distal pulses. Respiratory: Normal respiratory effort. No wheezes/rales/rhonchi. Musculoskeletal: Left upper extremities without obvious deformity, dislocation, or edema. Patient with normal range of motion without rotator cuff deficit. Is also noted to have normal elbow flexion and extension range and pronation supination of the forearm is intact. She The most pain localized to the dorsal wrist. Flexion and extension exacerbate her  discomfort. She does have a normal composite fist and normal grip strength bilaterally. Nontender with normal range of motion in all extremities.  Neurologic:  Normal gross sensation. Normal speech and language. No gross focal neurologic deficits are appreciated. Skin:  Skin is warm, dry and intact. No rash noted. ____________________________________________   RADIOLOGY Left Wrist  IMPRESSION: Diffuse degenerative change left wrist. Old radius styloid nonunited fracture. No acute bony abnormality identified. No acute fracture or dislocation .  I, Kynadee Dam, Dannielle Karvonen, personally viewed and evaluated these images (plain radiographs) as part of my medical decision making, as well as reviewing the written report by the radiologist. ____________________________________________  PROCEDURES  Ultram 50 mg PO ____________________________________________  INITIAL IMPRESSION / ASSESSMENT AND PLAN / ED COURSE  Patient with left upper extremity pain without acute fracture dislocation. The patient's symptoms may be due to underlying arthritis at the wrist. Exam is otherwise benign for any acute findings. She is discharged with a prescription for  him, and will follow-up with Dr. Doy Hutching for ongoing symptom management. She also should inquire about potential pain management referral for her generalized pain due to underlying arthritis.  Clinical Course    ____________________________________________  FINAL CLINICAL IMPRESSION(S) / ED DIAGNOSES  Final diagnoses:  Left wrist pain  Primary osteoarthritis of left wrist      Melvenia Needles, PA-C 06/06/16 1500    Orbie Pyo, MD 06/06/16 1520

## 2016-06-06 NOTE — ED Triage Notes (Signed)
Patient presents to the ED with left arm pain from shoulder to wrist x 1 week.  Patient denies injury to left arm.  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  Patient is alert and oriented x 4.

## 2016-06-06 NOTE — Discharge Instructions (Signed)
Follow-up with Dr. Doy Hutching for further treatment or referral to pain management. Apply moist heat to the arm for comfort. Take the pain medicine as needed.

## 2016-07-18 IMAGING — DX DG HUMERUS 2V *R*
2 series · 2 of 2 positions shown · non-contrast
Comparison: None.

CLINICAL DATA: Right shoulder pain since falling today.

EXAM:
RIGHT HUMERUS - 2+ VIEW

[humerus ap]
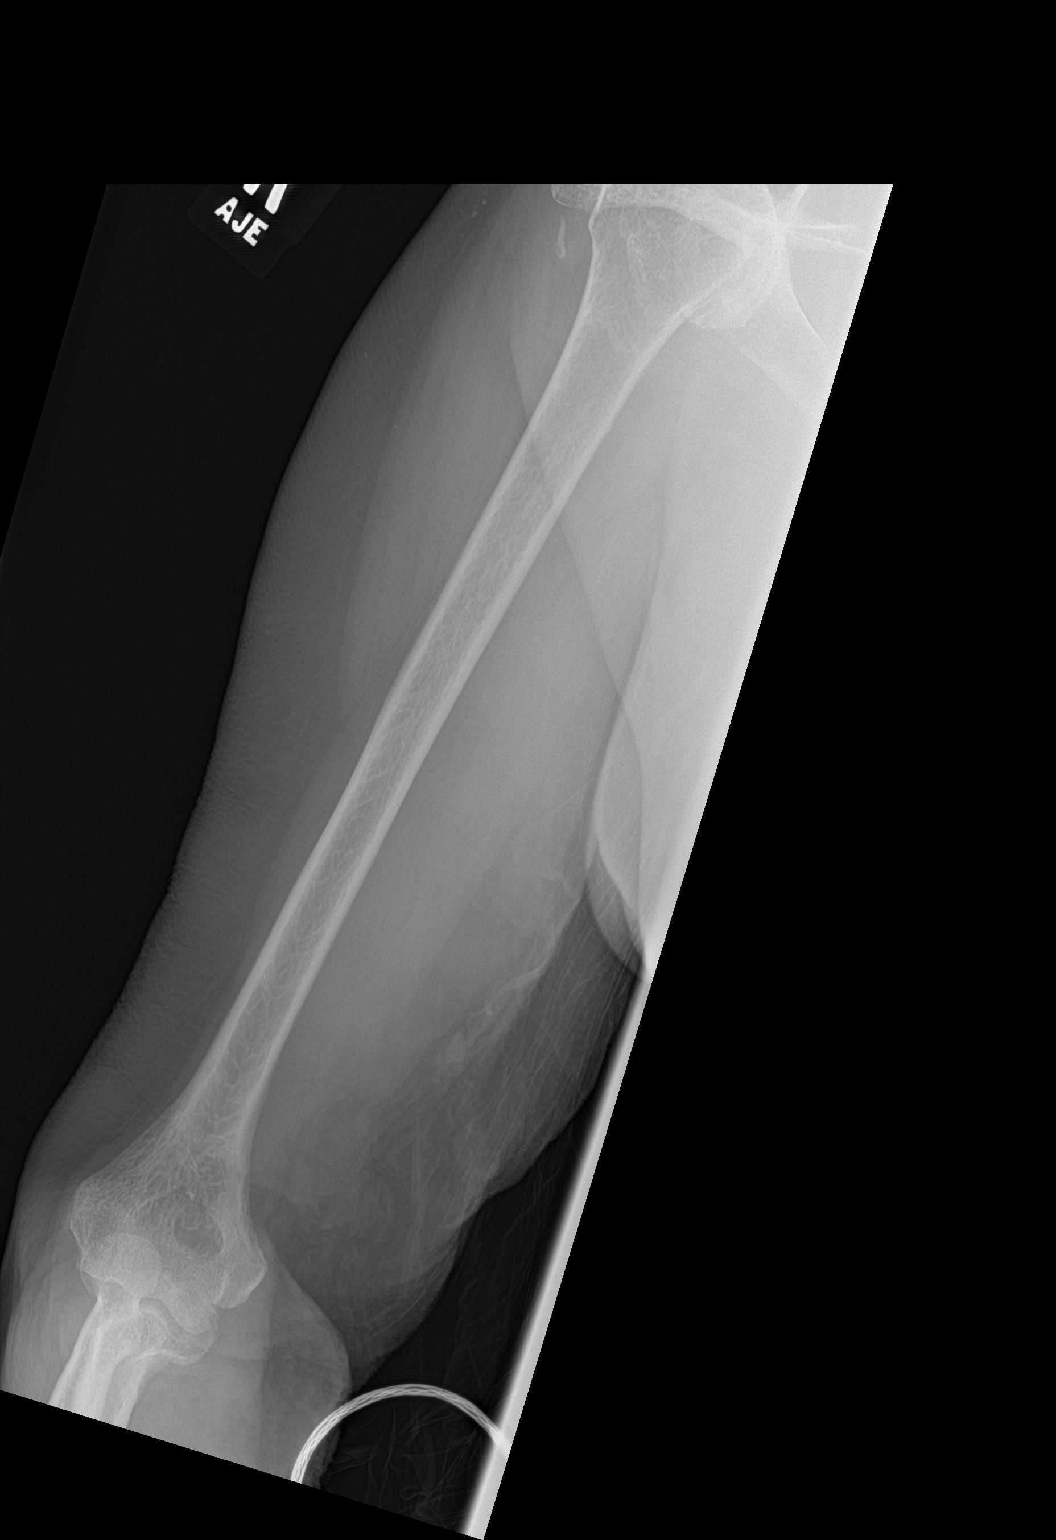

[humerus lat]
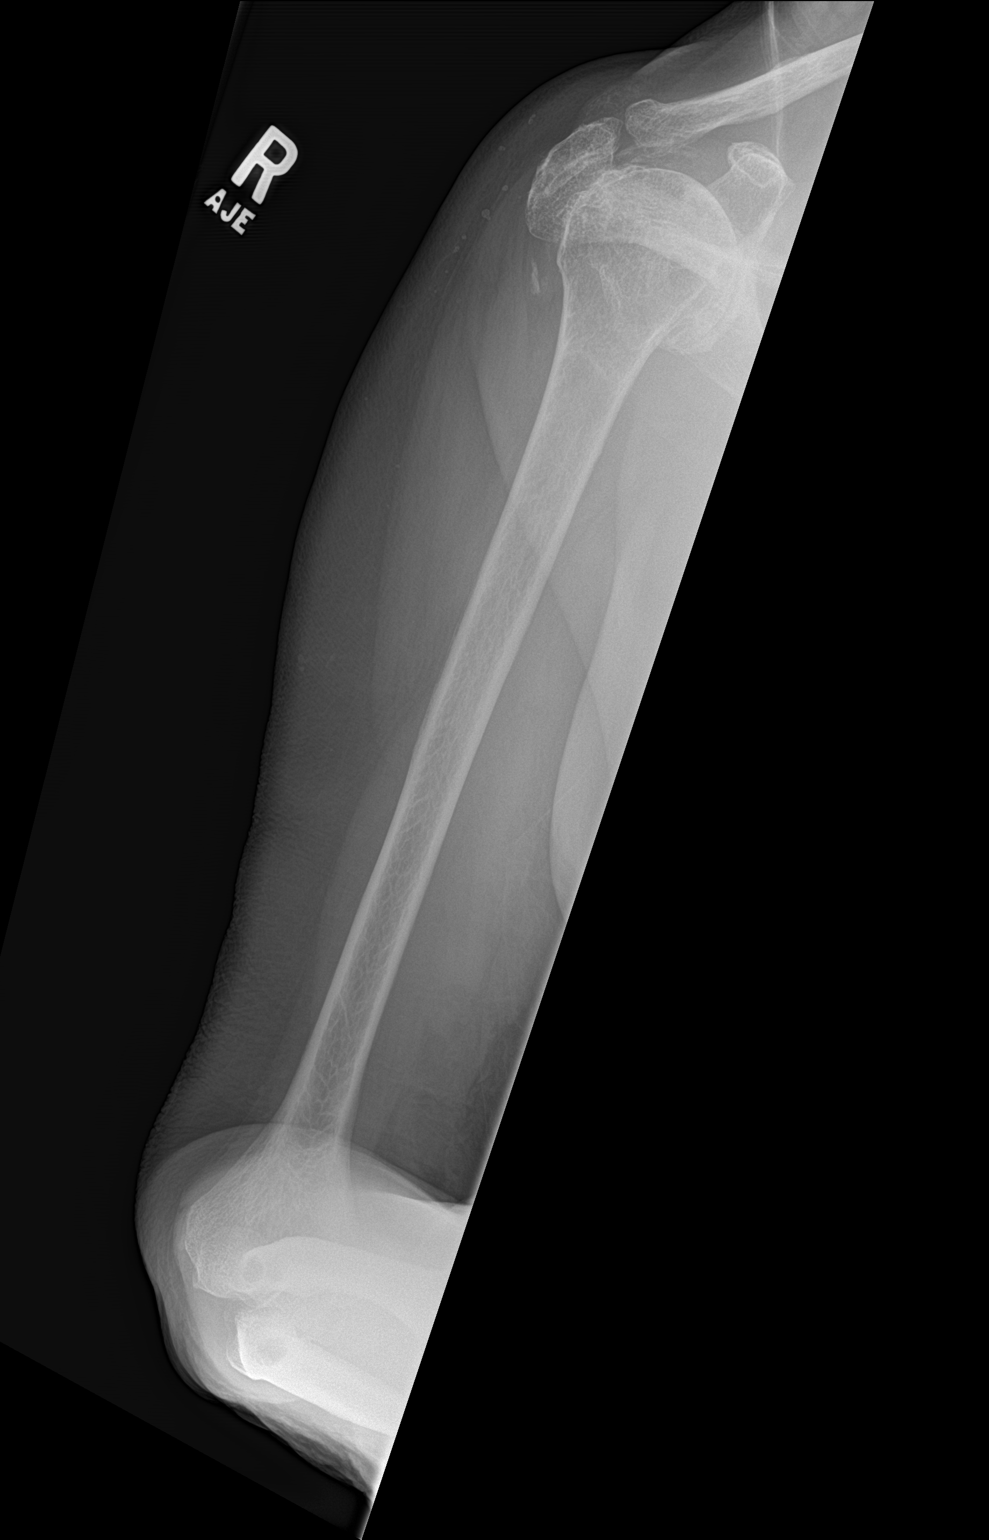

[2 of 2 positions shown; findings below may reference images not displayed]

FINDINGS: The positioning is similar on the two views. The shoulder is not
seen in the lateral projection. The mineralization and alignment are
normal. There is no evidence of acute fracture or dislocation.
Moderate glenohumeral and acromioclavicular degenerative changes are
present. There is narrowing of the subacromial space of the shoulder
with surrounding soft tissue calcifications, some of which may be
dermal.
IMPRESSION: No acute osseous findings demonstrated. Shoulder evaluation is
limited by positioning ; consider dedicated shoulder radiographs.
There are moderately advanced glenohumeral and acromioclavicular
degenerative changes.

## 2016-07-18 IMAGING — DX DG CHEST 1V
1 series · 1 of 1 positions shown · non-contrast
Comparison: 07/25/2015

CLINICAL DATA: Syncope. Fall in kitchen while cooking. Chest and
bilateral shoulder pain. Congestive heart failure. Initial
encounter.

EXAM:
CHEST 1 VIEW

[chest ap]
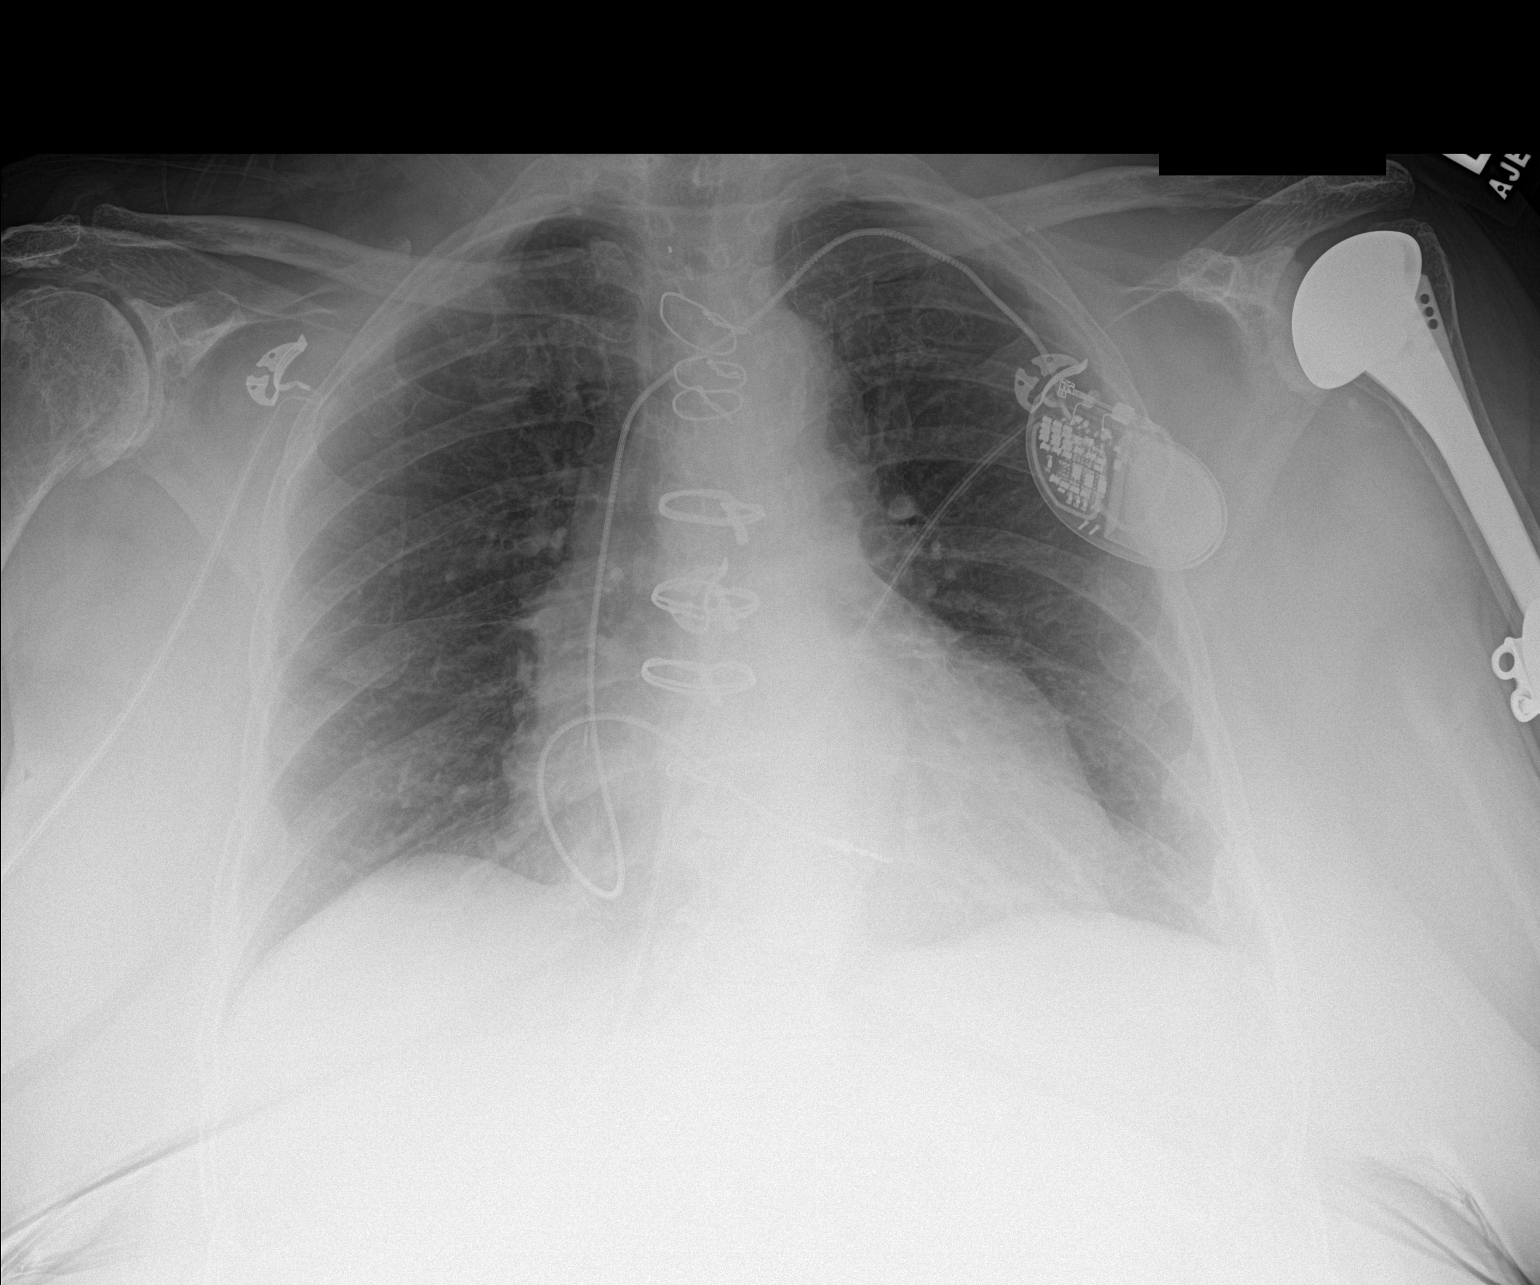

[1 of 1 positions shown; findings below may reference images not displayed]

FINDINGS: Prior median sternotomy noted. Transvenous pacemaker shows redundant
loop in the right atrium with distal tip overlying the right
ventricle. Both lungs are clear. No evidence of pneumothorax or
pleural effusion. Multiple old left lower rib fracture deformities
again noted. Left shoulder prosthesis again noted.
IMPRESSION: No active cardiopulmonary disease.

The transvenous pacemaker shows redundant loop in the right atrium,
with distal tip still in region of right ventricle.

## 2016-07-26 ENCOUNTER — Emergency Department: Payer: Medicare HMO

## 2016-07-26 ENCOUNTER — Encounter: Payer: Self-pay | Admitting: Emergency Medicine

## 2016-07-26 ENCOUNTER — Inpatient Hospital Stay
Admission: EM | Admit: 2016-07-26 | Discharge: 2016-07-28 | DRG: 291 | Disposition: A | Discharge status: Home / Self Care | Payer: Medicare HMO | Attending: Internal Medicine | Admitting: Internal Medicine

## 2016-07-26 DIAGNOSIS — I10 Essential (primary) hypertension: Secondary | ICD-10-CM | POA: Diagnosis not present

## 2016-07-26 DIAGNOSIS — Z7983 Long term (current) use of bisphosphonates: Secondary | ICD-10-CM

## 2016-07-26 DIAGNOSIS — J9601 Acute respiratory failure with hypoxia: Secondary | ICD-10-CM | POA: Diagnosis not present

## 2016-07-26 DIAGNOSIS — J9600 Acute respiratory failure, unspecified whether with hypoxia or hypercapnia: Secondary | ICD-10-CM | POA: Diagnosis not present

## 2016-07-26 DIAGNOSIS — E785 Hyperlipidemia, unspecified: Secondary | ICD-10-CM | POA: Diagnosis present

## 2016-07-26 DIAGNOSIS — Z794 Long term (current) use of insulin: Secondary | ICD-10-CM

## 2016-07-26 DIAGNOSIS — Z96651 Presence of right artificial knee joint: Secondary | ICD-10-CM | POA: Diagnosis not present

## 2016-07-26 DIAGNOSIS — R0602 Shortness of breath: Secondary | ICD-10-CM

## 2016-07-26 DIAGNOSIS — Z7951 Long term (current) use of inhaled steroids: Secondary | ICD-10-CM

## 2016-07-26 DIAGNOSIS — I495 Sick sinus syndrome: Secondary | ICD-10-CM | POA: Diagnosis not present

## 2016-07-26 DIAGNOSIS — I2510 Atherosclerotic heart disease of native coronary artery without angina pectoris: Secondary | ICD-10-CM | POA: Diagnosis not present

## 2016-07-26 DIAGNOSIS — I5032 Chronic diastolic (congestive) heart failure: Secondary | ICD-10-CM

## 2016-07-26 DIAGNOSIS — E1122 Type 2 diabetes mellitus with diabetic chronic kidney disease: Secondary | ICD-10-CM | POA: Diagnosis present

## 2016-07-26 DIAGNOSIS — I4891 Unspecified atrial fibrillation: Secondary | ICD-10-CM | POA: Diagnosis present

## 2016-07-26 DIAGNOSIS — I130 Hypertensive heart and chronic kidney disease with heart failure and stage 1 through stage 4 chronic kidney disease, or unspecified chronic kidney disease: Principal | ICD-10-CM | POA: Diagnosis present

## 2016-07-26 DIAGNOSIS — N183 Chronic kidney disease, stage 3 (moderate): Secondary | ICD-10-CM | POA: Diagnosis present

## 2016-07-26 DIAGNOSIS — M6281 Muscle weakness (generalized): Secondary | ICD-10-CM

## 2016-07-26 DIAGNOSIS — I5021 Acute systolic (congestive) heart failure: Secondary | ICD-10-CM | POA: Diagnosis not present

## 2016-07-26 DIAGNOSIS — I110 Hypertensive heart disease with heart failure: Secondary | ICD-10-CM | POA: Diagnosis not present

## 2016-07-26 DIAGNOSIS — Z8673 Personal history of transient ischemic attack (TIA), and cerebral infarction without residual deficits: Secondary | ICD-10-CM

## 2016-07-26 DIAGNOSIS — Z96641 Presence of right artificial hip joint: Secondary | ICD-10-CM | POA: Diagnosis present

## 2016-07-26 DIAGNOSIS — Z951 Presence of aortocoronary bypass graft: Secondary | ICD-10-CM

## 2016-07-26 DIAGNOSIS — J9621 Acute and chronic respiratory failure with hypoxia: Secondary | ICD-10-CM

## 2016-07-26 DIAGNOSIS — I5023 Acute on chronic systolic (congestive) heart failure: Secondary | ICD-10-CM

## 2016-07-26 DIAGNOSIS — I509 Heart failure, unspecified: Secondary | ICD-10-CM | POA: Diagnosis not present

## 2016-07-26 DIAGNOSIS — Z950 Presence of cardiac pacemaker: Secondary | ICD-10-CM

## 2016-07-26 DIAGNOSIS — K219 Gastro-esophageal reflux disease without esophagitis: Secondary | ICD-10-CM | POA: Diagnosis present

## 2016-07-26 DIAGNOSIS — E119 Type 2 diabetes mellitus without complications: Secondary | ICD-10-CM | POA: Diagnosis not present

## 2016-07-26 DIAGNOSIS — Z79899 Other long term (current) drug therapy: Secondary | ICD-10-CM

## 2016-07-26 DIAGNOSIS — R0902 Hypoxemia: Secondary | ICD-10-CM

## 2016-07-26 DIAGNOSIS — R05 Cough: Secondary | ICD-10-CM | POA: Diagnosis not present

## 2016-07-26 DIAGNOSIS — Z7901 Long term (current) use of anticoagulants: Secondary | ICD-10-CM

## 2016-07-26 LAB — CBC WITH DIFFERENTIAL/PLATELET
Basophils Absolute: 0 10*3/uL (ref 0–0.1)
Basophils Relative: 0 %
EOS ABS: 0 10*3/uL (ref 0–0.7)
EOS PCT: 0 %
HCT: 36 % (ref 35.0–47.0)
Hemoglobin: 12.2 g/dL (ref 12.0–16.0)
Lymphocytes Relative: 4 %
Lymphs Abs: 0.3 10*3/uL — ABNORMAL LOW (ref 1.0–3.6)
MCH: 29.4 pg (ref 26.0–34.0)
MCHC: 34 g/dL (ref 32.0–36.0)
MCV: 86.4 fL (ref 80.0–100.0)
MONO ABS: 0.3 10*3/uL (ref 0.2–0.9)
MONOS PCT: 5 %
Neutro Abs: 6.7 10*3/uL — ABNORMAL HIGH (ref 1.4–6.5)
Neutrophils Relative %: 91 %
PLATELETS: 238 10*3/uL (ref 150–440)
RBC: 4.17 MIL/uL (ref 3.80–5.20)
RDW: 16.2 % — AB (ref 11.5–14.5)
WBC: 7.4 10*3/uL (ref 3.6–11.0)

## 2016-07-26 LAB — COMPREHENSIVE METABOLIC PANEL
ALT: 13 U/L — ABNORMAL LOW (ref 14–54)
ANION GAP: 9 (ref 5–15)
AST: 20 U/L (ref 15–41)
Albumin: 4 g/dL (ref 3.5–5.0)
Alkaline Phosphatase: 58 U/L (ref 38–126)
BUN: 17 mg/dL (ref 6–20)
CALCIUM: 9.3 mg/dL (ref 8.9–10.3)
CHLORIDE: 98 mmol/L — AB (ref 101–111)
CO2: 26 mmol/L (ref 22–32)
Creatinine, Ser: 0.76 mg/dL (ref 0.44–1.00)
Glucose, Bld: 308 mg/dL — ABNORMAL HIGH (ref 65–99)
Potassium: 4.3 mmol/L (ref 3.5–5.1)
SODIUM: 133 mmol/L — AB (ref 135–145)
Total Bilirubin: 1.2 mg/dL (ref 0.3–1.2)
Total Protein: 7.9 g/dL (ref 6.5–8.1)

## 2016-07-26 LAB — GLUCOSE, CAPILLARY
GLUCOSE-CAPILLARY: 266 mg/dL — AB (ref 65–99)
Glucose-Capillary: 169 mg/dL — ABNORMAL HIGH (ref 65–99)

## 2016-07-26 LAB — TROPONIN I
TROPONIN I: 0.03 ng/mL — AB (ref <0.03)
TROPONIN I: 0.06 ng/mL — AB (ref <0.03)
Troponin I: 0.05 ng/mL (ref <0.03)

## 2016-07-26 MED ORDER — GABAPENTIN 100 MG PO CAPS
200.0000 mg | ORAL_CAPSULE | Freq: Three times a day (TID) | ORAL | Status: DC
  Administered 2016-07-26 – 2016-07-28 (×5): 200 mg via ORAL
  Filled 2016-07-26 (×5): qty 2

## 2016-07-26 MED ORDER — BISACODYL 10 MG RE SUPP
10.0000 mg | Freq: Every day | RECTAL | Status: DC | PRN
Start: 2016-07-26 — End: 2016-07-28

## 2016-07-26 MED ORDER — OCUVITE-LUTEIN PO CAPS
1.0000 | ORAL_CAPSULE | Freq: Every day | ORAL | Status: DC
  Administered 2016-07-27 – 2016-07-28 (×2): 1 via ORAL
  Filled 2016-07-26 (×2): qty 1

## 2016-07-26 MED ORDER — ONDANSETRON HCL 4 MG PO TABS: 4.0000 mg | ORAL_TABLET | Freq: Four times a day (QID) | ORAL | Status: DC | PRN

## 2016-07-26 MED ORDER — SODIUM CHLORIDE 0.9% FLUSH
3.0000 mL | Freq: Two times a day (BID) | INTRAVENOUS | Status: DC
  Administered 2016-07-26 – 2016-07-28 (×4): 3 mL via INTRAVENOUS

## 2016-07-26 MED ORDER — IPRATROPIUM-ALBUTEROL 0.5-2.5 (3) MG/3ML IN SOLN
3.0000 mL | Freq: Four times a day (QID) | RESPIRATORY_TRACT | Status: DC
  Administered 2016-07-26 – 2016-07-28 (×7): 3 mL via RESPIRATORY_TRACT
  Filled 2016-07-26 (×8): qty 3

## 2016-07-26 MED ORDER — APIXABAN 5 MG PO TABS
5.0000 mg | ORAL_TABLET | Freq: Two times a day (BID) | ORAL | Status: DC
  Administered 2016-07-26 – 2016-07-28 (×4): 5 mg via ORAL
  Filled 2016-07-26 (×4): qty 1

## 2016-07-26 MED ORDER — FUROSEMIDE 10 MG/ML IJ SOLN
60.0000 mg | Freq: Once | INTRAMUSCULAR | Status: AC
  Administered 2016-07-26: 60 mg via INTRAVENOUS
  Filled 2016-07-26: qty 8

## 2016-07-26 MED ORDER — DOCUSATE SODIUM 100 MG PO CAPS
100.0000 mg | ORAL_CAPSULE | Freq: Two times a day (BID) | ORAL | Status: DC
  Administered 2016-07-26 – 2016-07-28 (×4): 100 mg via ORAL
  Filled 2016-07-26 (×4): qty 1

## 2016-07-26 MED ORDER — HYDROCODONE-ACETAMINOPHEN 5-325 MG PO TABS: 1.0000 | ORAL_TABLET | Freq: Every evening | ORAL | Status: DC | PRN

## 2016-07-26 MED ORDER — PRAVASTATIN SODIUM 20 MG PO TABS
40.0000 mg | ORAL_TABLET | Freq: Every day | ORAL | Status: DC
  Administered 2016-07-26 – 2016-07-27 (×2): 40 mg via ORAL
  Filled 2016-07-26 (×2): qty 2

## 2016-07-26 MED ORDER — IPRATROPIUM-ALBUTEROL 0.5-2.5 (3) MG/3ML IN SOLN
3.0000 mL | Freq: Once | RESPIRATORY_TRACT | Status: AC
  Administered 2016-07-26: 3 mL via RESPIRATORY_TRACT
  Filled 2016-07-26: qty 3

## 2016-07-26 MED ORDER — SODIUM CHLORIDE 0.9 % IV SOLN: 250.0000 mL | INTRAVENOUS | Status: DC | PRN

## 2016-07-26 MED ORDER — PANTOPRAZOLE SODIUM 40 MG PO TBEC
40.0000 mg | DELAYED_RELEASE_TABLET | Freq: Every day | ORAL | Status: DC
  Administered 2016-07-26 – 2016-07-28 (×3): 40 mg via ORAL
  Filled 2016-07-26 (×3): qty 1

## 2016-07-26 MED ORDER — SODIUM CHLORIDE 0.9% FLUSH: 3.0000 mL | INTRAVENOUS | Status: DC | PRN

## 2016-07-26 MED ORDER — ALPRAZOLAM 1 MG PO TABS: 1.0000 mg | ORAL_TABLET | Freq: Every evening | ORAL | Status: DC | PRN

## 2016-07-26 MED ORDER — INSULIN ASPART 100 UNIT/ML ~~LOC~~ SOLN
0.0000 [IU] | Freq: Three times a day (TID) | SUBCUTANEOUS | Status: DC
Start: 2016-07-26 — End: 2016-07-28
  Administered 2016-07-26: 5 [IU] via SUBCUTANEOUS
  Administered 2016-07-27: 2 [IU] via SUBCUTANEOUS
  Administered 2016-07-27: 1 [IU] via SUBCUTANEOUS
  Administered 2016-07-27: 2 [IU] via SUBCUTANEOUS
  Administered 2016-07-28 (×2): 3 [IU] via SUBCUTANEOUS
  Filled 2016-07-26: qty 3
  Filled 2016-07-26: qty 1
  Filled 2016-07-26: qty 2
  Filled 2016-07-26: qty 5
  Filled 2016-07-26: qty 3
  Filled 2016-07-26: qty 2

## 2016-07-26 MED ORDER — ACETAMINOPHEN 325 MG PO TABS
650.0000 mg | ORAL_TABLET | Freq: Four times a day (QID) | ORAL | Status: DC | PRN
Start: 2016-07-26 — End: 2016-07-28
  Administered 2016-07-26: 650 mg via ORAL
  Filled 2016-07-26: qty 2

## 2016-07-26 MED ORDER — ALBUTEROL SULFATE HFA 108 (90 BASE) MCG/ACT IN AERS: 2.0000 | INHALATION_SPRAY | Freq: Four times a day (QID) | RESPIRATORY_TRACT | Status: DC | PRN

## 2016-07-26 MED ORDER — AMLODIPINE BESYLATE 5 MG PO TABS
5.0000 mg | ORAL_TABLET | Freq: Every day | ORAL | Status: DC
  Administered 2016-07-26 – 2016-07-28 (×3): 5 mg via ORAL
  Filled 2016-07-26 (×3): qty 1

## 2016-07-26 MED ORDER — ACETAMINOPHEN 650 MG RE SUPP: 650.0000 mg | Freq: Four times a day (QID) | RECTAL | Status: DC | PRN

## 2016-07-26 MED ORDER — FUROSEMIDE 10 MG/ML IJ SOLN
40.0000 mg | Freq: Two times a day (BID) | INTRAMUSCULAR | Status: DC
  Administered 2016-07-27 – 2016-07-28 (×3): 40 mg via INTRAVENOUS
  Filled 2016-07-26 (×3): qty 4

## 2016-07-26 MED ORDER — LOSARTAN POTASSIUM 50 MG PO TABS
100.0000 mg | ORAL_TABLET | Freq: Every day | ORAL | Status: DC
  Administered 2016-07-27 – 2016-07-28 (×2): 100 mg via ORAL
  Filled 2016-07-26 (×2): qty 2

## 2016-07-26 MED ORDER — FAMOTIDINE IN NACL 20-0.9 MG/50ML-% IV SOLN
20.0000 mg | Freq: Two times a day (BID) | INTRAVENOUS | Status: DC
  Administered 2016-07-26 – 2016-07-27 (×2): 20 mg via INTRAVENOUS
  Filled 2016-07-26 (×4): qty 50

## 2016-07-26 MED ORDER — NITROGLYCERIN 2 % TD OINT
1.0000 [in_us] | TOPICAL_OINTMENT | Freq: Four times a day (QID) | TRANSDERMAL | Status: DC
  Administered 2016-07-26 – 2016-07-28 (×7): 1 [in_us] via TOPICAL
  Filled 2016-07-26 (×7): qty 1

## 2016-07-26 MED ORDER — SODIUM CHLORIDE 0.9% FLUSH: 3.0000 mL | Freq: Two times a day (BID) | INTRAVENOUS | Status: DC

## 2016-07-26 MED ORDER — ONDANSETRON HCL 4 MG/2ML IJ SOLN: 4.0000 mg | Freq: Four times a day (QID) | INTRAMUSCULAR | Status: DC | PRN

## 2016-07-26 MED ORDER — HEPARIN SODIUM (PORCINE) 5000 UNIT/ML IJ SOLN: 5000.0000 [IU] | Freq: Three times a day (TID) | INTRAMUSCULAR | Status: DC

## 2016-07-26 NOTE — H&P (Signed)
History and Physical    [name] [id number] DOB: 1930-01-09 DOA: 07/26/2016  Referring physician: Dr. Kerman Passey PCP: Idelle Crouch, MD  Specialists: Dr. Nehemiah Massed  Chief Complaint: SOB  HPI: Amber [name] is a 83 y.o. female has a past medical history significant for SSS s/p pacer with chronic systolic CHF with [id number] now with 2 day hx of worsening SOB and slight cough. No fever. Denies CP or LE edema. In ER, pt markedly hypoxic with worsening CHF noted on CXR. She is now admitted.  Review of Systems: The patient denies anorexia, fever, weight loss,, vision loss, decreased hearing, hoarseness, chest pain, syncope, peripheral edema, balance deficits, hemoptysis, abdominal pain, melena, hematochezia, severe indigestion/heartburn, hematuria, incontinence, genital sores, muscle weakness, suspicious skin lesions, transient blindness, difficulty walking, depression, unusual weight change, abnormal bleeding, enlarged lymph nodes, angioedema, and breast masses.   Past Medical History:  Diagnosis Date  . Anemia   . Anxiety   . Arthritis   . Benign neoplasm of colon   . CHF (congestive heart failure) ([place])   . Chronic kidney disease   . Chronic pain   . Complication of anesthesia 1980's   hard time waking up  . Coronary artery disease with unspecified angina pectoris   . Cough   . Diabetes mellitus without complication ([place])   . Essential hypertension   . High risk medication use   . Hyperlipidemia   . Plantar fascial fibromatosis   . Presence of permanent cardiac pacemaker   . Shortness of breath dyspnea    Past Surgical History:  Procedure Laterality Date  . ABDOMINAL HYSTERECTOMY    . BACK SURGERY  1960's   cage and screws in lower back  . CATARACT EXTRACTION W/ INTRAOCULAR LENS  IMPLANT, BILATERAL Bilateral   . CORONARY ARTERY BYPASS GRAFT  2008  . JOINT REPLACEMENT Right    hip and knee  . ORIF ANKLE FRACTURE Right 07/27/2014   Procedure: OPEN  REDUCTION INTERNAL FIXATION (ORIF) ANKLE FRACTURE;  Surgeon: Alta Corning, MD;  Location: [facility];  Service: Orthopedics;  Laterality: Right;  . PACEMAKER INSERTION Left 03/14/2015   Procedure: INSERTION PACEMAKER;  Surgeon: Isaias Cowman, MD;  Location: [facility];  Service: Cardiovascular;  Laterality: Left;  . ROTATOR CUFF REPAIR    . TOTAL HIP ARTHROPLASTY Right   . TOTAL KNEE ARTHROPLASTY Right   . VEIN LIGATION AND STRIPPING     Social History:  reports that she has never smoked. She has never used smokeless tobacco. She reports that she does not drink alcohol or use drugs.  Allergies  Allergen Reactions  . Ambien [Zolpidem Tartrate] Other (See Comments)    Reaction:  Keeps pt awake   . Etodolac Other (See Comments)    Reaction:  Unknown   . Iodine Itching  . Nsaids Other (See Comments)    Reaction:  Unknown   . Penicillins Itching and Other (See Comments)    Has patient had a PCN reaction causing immediate rash, facial/tongue/throat swelling, SOB or lightheadedness with hypotension: No Has patient had a PCN reaction causing severe rash involving mucus membranes or skin necrosis: No Has patient had a PCN reaction that required hospitalization No Has patient had a PCN reaction occurring within the last 10 years: No If all of the above answers are "NO", then may proceed with Cephalosporin use.  . Succinylcholine Other (See Comments)    Reaction:  Unknown     Family History  Problem Relation Age of Onset  .  Heart attack Mother   . Heart disease Father   . Alzheimer's disease Sister   . Cervical cancer Sister   . Heart failure Son     Prior to Admission medications   Medication Sig Start Date End Date Taking? Authorizing Provider  acetaminophen (TYLENOL) 500 MG tablet Take 1,000 mg by mouth every 6 (six) hours as needed for mild pain or headache.    Historical Provider, MD  alendronate (FOSAMAX) 70 MG tablet Take 70 mg by mouth every Thursday.     Historical Provider, MD   ALPRAZolam Duanne Moron) 1 MG tablet Take 1 tablet (1 mg total) by mouth at bedtime as needed for anxiety. 07/31/14   Silver Huguenin Elgergawy, MD  amLODipine (NORVASC) 5 MG tablet Take 5 mg by mouth daily.    Historical Provider, MD  apixaban (ELIQUIS) 5 MG TABS tablet Take 5 mg by mouth 2 (two) times daily.    Historical Provider, MD  Fluticasone-Salmeterol (ADVAIR) 250-50 MCG/DOSE AEPB Inhale 1 puff into the lungs 2 (two) times daily.    Historical Provider, MD  furosemide (LASIX) 20 MG tablet Take 1 tablet (20 mg total) by mouth daily. Patient not taking: Reported on 08/22/2015 12/25/14 12/25/15  Nance Pear, MD  gabapentin (NEURONTIN) 100 MG capsule Take 200 mg by mouth 3 (three) times daily.     Historical Provider, MD  HYDROcodone-acetaminophen (NORCO/VICODIN) 5-325 MG tablet 1-2 tabs po qhs prn 05/09/16   Norval Gable, MD  insulin regular (NOVOLIN R,HUMULIN R) 100 units/mL injection Inject 15-25 Units into the skin See admin instructions. 15-25 units every morning and 25 units at bedtime    Historical Provider, MD  losartan (COZAAR) 100 MG tablet Take 100 mg by mouth daily.    Historical Provider, MD  Multiple Vitamins-Minerals (PRESERVISION AREDS 2) CAPS Take 1 capsule by mouth daily.    Historical Provider, MD  omeprazole (PRILOSEC) 20 MG capsule Take 20 mg by mouth daily.    Historical Provider, MD  oxyCODONE-acetaminophen (PERCOCET/ROXICET) 5-325 MG per tablet Take 1 tablet by mouth every 6 (six) hours as needed for moderate pain. 07/31/14   Silver Huguenin Elgergawy, MD  pravastatin (PRAVACHOL) 40 MG tablet Take 40 mg by mouth at bedtime.    Historical Provider, MD  traMADol (ULTRAM) 50 MG tablet Take 1 tablet (50 mg total) by mouth 3 (three) times daily as needed. 06/06/16   Melvenia Needles, PA-C   Physical Exam: Vitals:   07/26/16 1035 07/26/16 1205 07/26/16 1230 07/26/16 1400  BP: (!) 165/72  (!) 154/98 123/64  Pulse:   (!) 59 (!) 59  Resp:   (!) 22 17  Temp:      TempSrc:      SpO2:    93% 95%  Weight:  85.5 kg (188 lb 9.6 oz)    Height:  5\' 2"  (1.575 m)       General:  No apparent distress, WDWN, [place]/AT  Eyes: PERRL, EOMI, no scleral icterus, conjunctiva clear  ENT: moist oropharynx without exudate, TM's benign, dentition fair  Neck: supple, no lymphadenopathy. No bruits or thyromegaly  Cardiovascular: regular rate with a 3/6 systolic murmur noted. No rubs or gallops; 2+ peripheral pulses, no JVD, no peripheral edema  Respiratory: bilateral rales without wheezes or rhonchi. No dullness. Respiratory effort increased  Abdomen: soft, non tender to palpation, positive bowel sounds, no guarding, no rebound  Skin: no rashes or lesions  Musculoskeletal: normal bulk and tone, no joint swelling  Psychiatric: normal mood and affect, A&OX3  Neurologic: CN 2-12 grossly intact, Motor strength 5/5 in all 4 groups with symmetric DTR's and non-focal sensory exam  Labs on Admission:  Basic Metabolic Panel:  Recent Labs Lab 07/26/16 1159  NA 133*  K 4.3  CL 98*  CO2 26  GLUCOSE 308*  BUN 17  CREATININE 0.76  CALCIUM 9.3   Liver Function Tests:  Recent Labs Lab 07/26/16 1159  AST 20  ALT 13*  ALKPHOS 58  BILITOT 1.2  PROT 7.9  ALBUMIN 4.0   No results for input(s): LIPASE, AMYLASE in the last 168 hours. No results for input(s): AMMONIA in the last 168 hours. CBC:  Recent Labs Lab 07/26/16 1159  WBC 7.4  NEUTROABS 6.7*  HGB 12.2  HCT 36.0  MCV 86.4  PLT 238   Cardiac Enzymes:  Recent Labs Lab 07/26/16 1159  TROPONINI 0.03*    BNP (last 3 results) No results for input(s): BNP in the last 8760 hours.  ProBNP (last 3 results) No results for input(s): PROBNP in the last 8760 hours.  CBG: No results for input(s): GLUCAP in the last 168 hours.  Radiological Exams on Admission: Dg Chest 2 View  Result Date: 07/26/2016 CLINICAL DATA:  Shortness of breath and cough beginning yesterday. EXAM: CHEST  2 VIEW COMPARISON:  08/22/2015 FINDINGS:  Previous median sternotomy. Single lead pacemaker with loop in the right atrium as seen previously. Lead is unchanged. There is cardiomegaly. There is venous hypertension with early interstitial edema. Small effusions in the posterior costophrenic angles. Chronic spinal degenerative and postoperative changes as seen previously. Previous surgical changes of the left upper extremity. IMPRESSION: Congestive heart failure with pulmonary venous hypertension and interstitial edema. Small effusions. Electronically Signed   By: Nelson Chimes M.D.   On: 07/26/2016 11:33    EKG: Independently reviewed.  Assessment/Plan Principal Problem:   Acute respiratory failure (HCC) Active Problems:   Essential hypertension   Sick sinus syndrome (HCC)   Acute on chronic systolic CHF (congestive heart failure) ([place])   Will admit to telemetry with O2, NTP, and IV Lasix. Follow enzymes. Order echo and Cardiology consult. Follow sugars. Repeat labs and CXR in AM. Wean O2 as tolerated.  Diet: low sodium Fluids: saline lock DVT Prophylaxis: SQ Heparin  Code Status: FULL  Family Communication: yes  Disposition Plan: home  Time spent: 50 min

## 2016-07-26 NOTE — ED Triage Notes (Signed)
Pt to ED via POV for c/o shortness of breath and cough. Pt states that shortness of breath started yesterday. Pt denies fevers or chills. Patient states that she feels very weak.

## 2016-07-26 NOTE — ED Provider Notes (Signed)
[facility] Emergency Department Provider Note  Time seen: 11:29 AM  I have reviewed the triage vital signs and the nursing notes.   HISTORY  Chief Complaint Shortness of Breath    HPI Amber [name] is a 83 y.o. female with a past medical history of COPD, CHF presents to the emergency department with shortness of breath starting last night. According to the patient since last night she has been feeling short of breath which she describes as not being able to get a full breath of air. Denies any chest pain pressure or tightness. Denies any leg pain or swelling. Patient does not normally wear oxygen, currently 83% on room air. Patient states cough but this is unchanged from her chronic cough. Denies any increased sputum production. Denies any fever.  Past Medical History:  Diagnosis Date  . Anemia   . Anxiety   . Arthritis   . Benign neoplasm of colon   . CHF (congestive heart failure) ([place])   . Chronic kidney disease   . Chronic pain   . Complication of anesthesia 1980's   hard time waking up  . Coronary artery disease with unspecified angina pectoris   . Cough   . Diabetes mellitus without complication ([place])   . Essential hypertension   . High risk medication use   . Hyperlipidemia   . Plantar fascial fibromatosis   . Presence of permanent cardiac pacemaker   . Shortness of breath dyspnea     Patient Active Problem List   Diagnosis Date Noted  . TIA (transient ischemic attack) 07/25/2015  . Sick sinus syndrome ([place]) 03/14/2015  . Bradycardia 02/16/2015  . Chronic heart failure ([place]) 01/05/2015  . Fracture dislocation of ankle 07/27/2014  . IDDM (insulin dependent diabetes mellitus) ([place]) 07/27/2014  . Essential hypertension 07/27/2014  . Multiple rib fractures 07/27/2014  . 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 ABDOMINAL HYSTERECTOMY    . BACK SURGERY  1960's   cage and screws in  lower back  . CATARACT EXTRACTION W/ INTRAOCULAR LENS  IMPLANT, BILATERAL Bilateral   . CORONARY ARTERY BYPASS GRAFT  2008  . JOINT REPLACEMENT Right    hip and knee  . ORIF ANKLE FRACTURE Right 07/27/2014   Procedure: OPEN REDUCTION INTERNAL FIXATION (ORIF) ANKLE FRACTURE;  Surgeon: Alta Corning, MD;  Location: [facility];  Service: Orthopedics;  Laterality: Right;  . PACEMAKER INSERTION Left 03/14/2015   Procedure: INSERTION PACEMAKER;  Surgeon: Isaias Cowman, MD;  Location: [facility];  Service: Cardiovascular;  Laterality: Left;  . ROTATOR CUFF REPAIR    . TOTAL HIP ARTHROPLASTY Right   . TOTAL KNEE ARTHROPLASTY Right   . VEIN LIGATION AND STRIPPING      Prior to Admission medications   Medication Sig Start Date End Date Taking? Authorizing Provider  acetaminophen (TYLENOL) 500 MG tablet Take 1,000 mg by mouth every 6 (six) hours as needed for mild pain or headache.    Historical Provider, MD  alendronate (FOSAMAX) 70 MG tablet Take 70 mg by mouth every Thursday.     Historical Provider, MD  ALPRAZolam Duanne Moron) 1 MG tablet Take 1 tablet (1 mg total) by mouth at bedtime as needed for anxiety. 07/31/14   Silver Huguenin Elgergawy, MD  amLODipine (NORVASC) 5 MG tablet Take 5 mg by mouth daily.    Historical Provider, MD  apixaban (ELIQUIS) 5 MG TABS tablet Take 5 mg by mouth 2 (two)  times daily.    Historical Provider, MD  Fluticasone-Salmeterol (ADVAIR) 250-50 MCG/DOSE AEPB Inhale 1 puff into the lungs 2 (two) times daily.    Historical Provider, MD  furosemide (LASIX) 20 MG tablet Take 1 tablet (20 mg total) by mouth daily. Patient not taking: Reported on 08/22/2015 12/25/14 12/25/15  Nance Pear, MD  gabapentin (NEURONTIN) 100 MG capsule Take 200 mg by mouth 3 (three) times daily.     Historical Provider, MD  HYDROcodone-acetaminophen (NORCO/VICODIN) 5-325 MG tablet 1-2 tabs po qhs prn 05/09/16   Norval Gable, MD  insulin regular (NOVOLIN R,HUMULIN R) 100 units/mL injection Inject 15-25 Units  into the skin See admin instructions. 15-25 units every morning and 25 units at bedtime    Historical Provider, MD  losartan (COZAAR) 100 MG tablet Take 100 mg by mouth daily.    Historical Provider, MD  Multiple Vitamins-Minerals (PRESERVISION AREDS 2) CAPS Take 1 capsule by mouth daily.    Historical Provider, MD  omeprazole (PRILOSEC) 20 MG capsule Take 20 mg by mouth daily.    Historical Provider, MD  oxyCODONE-acetaminophen (PERCOCET/ROXICET) 5-325 MG per tablet Take 1 tablet by mouth every 6 (six) hours as needed for moderate pain. 07/31/14   Silver Huguenin Elgergawy, MD  pravastatin (PRAVACHOL) 40 MG tablet Take 40 mg by mouth at bedtime.    Historical Provider, MD  traMADol (ULTRAM) 50 MG tablet Take 1 tablet (50 mg total) by mouth 3 (three) times daily as needed. 06/06/16   Jenise V Bacon Menshew, PA-C    Allergies  Allergen Reactions  . Ambien [Zolpidem Tartrate] Other (See Comments)    Reaction:  Keeps pt awake   . Etodolac Other (See Comments)    Reaction:  Unknown   . Iodine Itching  . Nsaids Other (See Comments)    Reaction:  Unknown   . Penicillins Itching and Other (See Comments)    Has patient had a PCN reaction causing immediate rash, facial/tongue/throat swelling, SOB or lightheadedness with hypotension: No Has patient had a PCN reaction causing severe rash involving mucus membranes or skin necrosis: No Has patient had a PCN reaction that required hospitalization No Has patient had a PCN reaction occurring within the last 10 years: No If all of the above answers are "NO", then may proceed with Cephalosporin use.  . Succinylcholine Other (See Comments)    Reaction:  Unknown     Family History  Problem Relation Age of Onset  . Heart attack Mother   . Heart disease Father   . Alzheimer's disease Sister   . Cervical cancer Sister   . Heart failure Son     Social History Social History  Substance Use Topics  . Smoking status: Never Smoker  . Smokeless tobacco: Never  Used  . Alcohol use No    Review of Systems Constitutional: Negative for fever. Cardiovascular: Negative for chest pain. Respiratory: Positive for shortness of breath. Positive for chronic cough, patient states unchanged. Gastrointestinal: Negative for abdominal pain Genitourinary: Negative for dysuria. Neurological: Negative for headache 10-point ROS otherwise negative.  ____________________________________________   PHYSICAL EXAM:  VITAL SIGNS: ED Triage Vitals  Enc Vitals Group     BP 07/26/16 1035 (!) 165/72     Pulse Rate 07/26/16 1032 60     Resp 07/26/16 1032 20     Temp 07/26/16 1032 97.9 F (36.6 C)     Temp Source 07/26/16 1032 Oral     SpO2 07/26/16 1032 (!) 83 %     Weight --  Height --      Head Circumference --      Peak Flow --      Pain Score --      Pain Loc --      Pain Edu? --      Excl. in [place]? --    Constitutional: Alert and oriented. Well appearing and in no distress. Eyes: Normal exam ENT   Head: Normocephalic and atraumatic.   Mouth/Throat: Mucous membranes are moist. Cardiovascular: Normal rate, regular rhythm. No murmur Respiratory: Normal respiratory effort. Decreased breath sounds bilaterally. No wheeze rales or rhonchi. Gastrointestinal: Soft and nontender. No distention.  Musculoskeletal: Nontender with normal range of motion in all extremities. No lower extremity tenderness or edema. Neurologic:  Normal speech and language. No gross focal neurologic deficits Skin:  Skin is warm, dry and intact.  Psychiatric: Mood and affect are normal.  ____________________________________________    EKG  EKG reviewed and interpreted by myself shows a ventricular paced rhythm at 62 bpm, no concerning findings.  ____________________________________________    RADIOLOGY  X-ray consistent with congestive heart failure with interstitial edema  ____________________________________________   INITIAL IMPRESSION / ASSESSMENT AND PLAN /  ED COURSE  Pertinent labs & imaging results that were available during my care of the patient were reviewed by me and considered in my medical decision making (see chart for details).  The patient presents to the emergency department with shortness of breath and hypoxia 83% on room air, no home O2 requirement at baseline. We will check labs, chest x-ray, influenza swab and closely monitor in the emergency department. Currently patient appears well, no acute distress. Satting in the mid 90s on 2 L of oxygen.  Patient's x-rays consistent with congestive heart failure with interstitial edema. Patient has a room air O2 saturation of 83% which is new for the patient, no home O2 requirement. Given the patient's hypoxia with evidence of edema on chest x-ray patient will be admitted to the hospital for diuresis. Patient agreeable to plan.  ____________________________________________   FINAL CLINICAL IMPRESSION(S) / ED DIAGNOSES  Dyspnea Congestive heart failure exacerbation   Harvest Dark, MD 07/26/16 1346

## 2016-07-27 ENCOUNTER — Inpatient Hospital Stay
Admit: 2016-07-27 | Discharge: 2016-07-27 | Disposition: A | Discharge status: Home / Self Care | Payer: Medicare HMO | Attending: Internal Medicine | Admitting: Internal Medicine

## 2016-07-27 ENCOUNTER — Inpatient Hospital Stay: Payer: Medicare HMO

## 2016-07-27 LAB — GLUCOSE, CAPILLARY
GLUCOSE-CAPILLARY: 139 mg/dL — AB (ref 65–99)
GLUCOSE-CAPILLARY: 195 mg/dL — AB (ref 65–99)
Glucose-Capillary: 208 mg/dL — ABNORMAL HIGH (ref 65–99)
Glucose-Capillary: 188 mg/dL — ABNORMAL HIGH (ref 65–99)
Glucose-Capillary: 178 mg/dL — ABNORMAL HIGH (ref 65–99)

## 2016-07-27 LAB — CBC
HEMATOCRIT: 34.1 % — AB (ref 35.0–47.0)
HEMOGLOBIN: 11.5 g/dL — AB (ref 12.0–16.0)
MCH: 29.3 pg (ref 26.0–34.0)
MCHC: 33.7 g/dL (ref 32.0–36.0)
MCV: 87.2 fL (ref 80.0–100.0)
Platelets: 225 10*3/uL (ref 150–440)
RBC: 3.91 MIL/uL (ref 3.80–5.20)
RDW: 15.4 % — ABNORMAL HIGH (ref 11.5–14.5)
WBC: 6.9 10*3/uL (ref 3.6–11.0)

## 2016-07-27 LAB — COMPREHENSIVE METABOLIC PANEL
ALT: 12 U/L — ABNORMAL LOW (ref 14–54)
ANION GAP: 9 (ref 5–15)
AST: 19 U/L (ref 15–41)
Albumin: 3.6 g/dL (ref 3.5–5.0)
Alkaline Phosphatase: 49 U/L (ref 38–126)
BILIRUBIN TOTAL: 1.1 mg/dL (ref 0.3–1.2)
BUN: 20 mg/dL (ref 6–20)
CALCIUM: 8.8 mg/dL — AB (ref 8.9–10.3)
CO2: 29 mmol/L (ref 22–32)
Chloride: 100 mmol/L — ABNORMAL LOW (ref 101–111)
Creatinine, Ser: 0.81 mg/dL (ref 0.44–1.00)
GLUCOSE: 198 mg/dL — AB (ref 65–99)
POTASSIUM: 3.4 mmol/L — AB (ref 3.5–5.1)
Sodium: 138 mmol/L (ref 135–145)
TOTAL PROTEIN: 6.9 g/dL (ref 6.5–8.1)

## 2016-07-27 LAB — BASIC METABOLIC PANEL
ANION GAP: 10 (ref 5–15)
BUN: 20 mg/dL (ref 6–20)
CO2: 30 mmol/L (ref 22–32)
Calcium: 8.9 mg/dL (ref 8.9–10.3)
Chloride: 96 mmol/L — ABNORMAL LOW (ref 101–111)
Creatinine, Ser: 0.9 mg/dL (ref 0.44–1.00)
GFR calc Af Amer: >60 mL/min (ref >60)
GFR calc non Af Amer: 57 mL/min — ABNORMAL LOW (ref >60)
GLUCOSE: 213 mg/dL — AB (ref 65–99)
POTASSIUM: 3.6 mmol/L (ref 3.5–5.1)
Sodium: 136 mmol/L (ref 135–145)

## 2016-07-27 LAB — TROPONIN I: Troponin I: 0.05 ng/mL (ref <0.03)

## 2016-07-27 MED ORDER — FAMOTIDINE 20 MG PO TABS
20.0000 mg | ORAL_TABLET | Freq: Every day | ORAL | Status: DC
  Administered 2016-07-27: 20 mg via ORAL
  Filled 2016-07-27: qty 1

## 2016-07-27 MED ORDER — POTASSIUM CHLORIDE CRYS ER 20 MEQ PO TBCR
40.0000 meq | EXTENDED_RELEASE_TABLET | Freq: Every day | ORAL | Status: DC
  Administered 2016-07-27 – 2016-07-28 (×2): 40 meq via ORAL
  Filled 2016-07-27 (×2): qty 2

## 2016-07-27 NOTE — Plan of Care (Signed)
Problem: Physical Regulation: Goal: Ability to maintain clinical measurements within normal limits will improve Outcome: Progressing Patient weaned to room air. o2 sats 94%  Problem: Activity: Goal: Risk for activity intolerance will decrease Outcome: Progressing Patient ambulating to bathroom.

## 2016-07-27 NOTE — Progress Notes (Signed)
PHARMACIST - PHYSICIAN COMMUNICATION   CONCERNING: IV to Oral Route Change Policy And Renal Adjustment  RECOMMENDATION: This patient is receiving FAMOTIDINE by the intravenous route.  Based on criteria approved by the Pharmacy and Therapeutics Committee, the intravenous medication(s) is/are being converted to the equivalent oral dose form(s).  Transitioned Famotidine to 20mg  PO Q24h for Crcl 45 ml/min   DESCRIPTION: These criteria include:  The patient is eating (either orally or via tube) and/or has been taking other orally administered medications for a least 24 hours  The patient has no evidence of active gastrointestinal bleeding or impaired GI absorption (gastrectomy, short bowel, patient on TNA or NPO).  If you have questions about this conversion, please contact the Pharmacy Department  []   [phone] )  [facility] [x]   [phone] )  [facility] []   [phone] )  [facility] []   [phone] )  [facility] []   [phone] )  [place], [facility] 07/27/2016 5:24 PM

## 2016-07-27 NOTE — Progress Notes (Signed)
[facility] [place] NAME: Amber [name]    MR#:  [id number]  DATE OF BIRTH:  [birth year]  SUBJECTIVE:   patient feeling better still with some SOB but can lie flat this am  REVIEW OF SYSTEMS:    Review of Systems  Constitutional: Negative for chills, fever and malaise/fatigue.       Tired  HENT: Negative.  Negative for ear discharge, ear pain, hearing loss, nosebleeds and sore throat.   Eyes: Negative.  Negative for blurred vision and pain.  Respiratory: Positive for shortness of breath. Negative for cough, hemoptysis and wheezing.   Cardiovascular: Negative.  Negative for chest pain, palpitations and leg swelling.  Gastrointestinal: Negative.  Negative for abdominal pain, blood in stool, diarrhea, nausea and vomiting.  Genitourinary: Negative.  Negative for dysuria.  Musculoskeletal: Negative.  Negative for back pain.  Skin: Negative.   Neurological: Negative for dizziness, tremors, speech change, focal weakness, seizures and headaches.  Endo/Heme/Allergies: Negative.  Does not bruise/bleed easily.  Psychiatric/Behavioral: Negative.  Negative for depression, hallucinations and suicidal ideas.    Tolerating Diet: yes      DRUG ALLERGIES:   Allergies  Allergen Reactions  . Ambien [Zolpidem Tartrate] Other (See Comments)    Reaction:  Keeps pt awake   . Etodolac Other (See Comments)    Reaction:  Unknown   . Iodine Itching  . Nsaids Other (See Comments)    Reaction:  Unknown   . Penicillins Itching and Other (See Comments)    Has patient had a PCN reaction causing immediate rash, facial/tongue/throat swelling, SOB or lightheadedness with hypotension: No Has patient had a PCN reaction causing severe rash involving mucus membranes or skin necrosis: No Has patient had a PCN reaction that required hospitalization No Has patient had a PCN reaction occurring within the last 10 years: No If all of the above answers are "NO", then  may proceed with Cephalosporin use.  . Succinylcholine Other (See Comments)    Reaction:  Unknown     VITALS:  Blood pressure (!) 124/59, pulse 61, temperature 97.9 F (36.6 C), temperature source Oral, resp. rate 18, height 5\' 1"  (1.549 m), weight 82.2 kg (181 lb 3.2 oz), SpO2 99 %.  PHYSICAL EXAMINATION:   Physical Exam  Constitutional: She is oriented to person, place, and time and well-developed, well-nourished, and in no distress. No distress.  HENT:  Head: Normocephalic.  Eyes: No scleral icterus.  Neck: Normal range of motion. Neck supple. No JVD present. No tracheal deviation present.  Cardiovascular: Normal rate and regular rhythm.  Exam reveals no gallop and no friction rub.   Murmur heard. Pulmonary/Chest: Effort normal. No respiratory distress. She has no wheezes. She has rales. She exhibits no tenderness.  Abdominal: Soft. Bowel sounds are normal. She exhibits no distension and no mass. There is no tenderness. There is no rebound and no guarding.  Musculoskeletal: Normal range of motion. She exhibits no edema.  Neurological: She is alert and oriented to person, place, and time.  Skin: Skin is warm. No rash noted. No erythema.  Psychiatric: Affect and judgment normal.      LABORATORY PANEL:   CBC  Recent Labs Lab 07/27/16 0411  WBC 6.9  HGB 11.5*  HCT 34.1*  PLT 225   ------------------------------------------------------------------------------------------------------------------  Chemistries   Recent Labs Lab 07/27/16 0411 07/27/16 0943  NA 138 136  K 3.4* 3.6  CL 100* 96*  CO2 29 30  GLUCOSE 198* 213*  BUN 20 20  CREATININE 0.81 0.90  CALCIUM 8.8* 8.9  AST 19  --   ALT 12*  --   ALKPHOS 49  --   BILITOT 1.1  --    ------------------------------------------------------------------------------------------------------------------  Cardiac Enzymes  Recent Labs Lab 07/26/16 1633 07/26/16 2218 07/27/16 0411  TROPONINI 0.05* 0.06* 0.05*    ------------------------------------------------------------------------------------------------------------------  RADIOLOGY:  Dg Chest 2 View  Result Date: 07/27/2016 CLINICAL DATA:  Congestive heart failure. EXAM: CHEST  2 VIEW COMPARISON:  July 26, 2016 FINDINGS: Stable pacemaker. Mild edema is slightly improved. The small effusions seen yesterday have improved as well. No other changes. IMPRESSION: Improving edema and small effusions. Electronically Signed   By: Dorise Bullion III M.D   On: 07/27/2016 08:19   Dg Chest 2 View  Result Date: 07/26/2016 CLINICAL DATA:  Shortness of breath and cough beginning yesterday. EXAM: CHEST  2 VIEW COMPARISON:  08/22/2015 FINDINGS: Previous median sternotomy. Single lead pacemaker with loop in the right atrium as seen previously. Lead is unchanged. There is cardiomegaly. There is venous hypertension with early interstitial edema. Small effusions in the posterior costophrenic angles. Chronic spinal degenerative and postoperative changes as seen previously. Previous surgical changes of the left upper extremity. IMPRESSION: Congestive heart failure with pulmonary venous hypertension and interstitial edema. Small effusions. Electronically Signed   By: Nelson Chimes M.D.   On: 07/26/2016 11:33     ASSESSMENT AND PLAN:   83 year old female with chronic systolic heart failure ejection fraction of 25% presented with shortness of breath.   1. Acute hypoxic respiratory failure in the setting of acute on chronic systolic heart failure Wean oxygen as tolerated  2. Acute on chronic systolic heart failure EF of 25%: Continue IV Lasix for diuresis Monitor weight and output Follow up on cardiology consultation and echocardiogram Continue losartan Patient has pacemaker may be a candidate for beta blocker we'll await cardiology consult  3. Essential hypertension on losartan and Norvasc  4. Chronic kidney disease stage III with stable creatinine  5.  Hyperlipidemia on pravastatin  6. Diabetes: Continue sliding scale insulin  7. Atrial fibrillation: Continue Eliquis  8. GERD on PPI  Management plans discussed with the patient and she is in agreement.  CODE STATUS: FULL  TOTAL TIME TAKING CARE OF THIS PATIENT: 30 minutes.    Physical therapy consult for disposition POSSIBLE D/C tomorrow, DEPENDING ON CLINICAL CONDITION.   Gavinn Collard M.D on 07/27/2016 at 11:31 AM  Between 7am to 6pm - Pager - [phone] After 6pm go to [email or web address] - password [email or web address] [facility]  Office  [phone]  CC: Primary care physician; Idelle Crouch, MD  Note: This dictation was prepared with Dragon dictation along with smaller phrase technology. Any transcriptional errors that result from this process are unintentional.

## 2016-07-27 NOTE — Progress Notes (Signed)
Physical Therapy Evaluation Patient Details Name: Amber [name] MRN: [id number] DOB: 09-27-1929 Today's Date: 07/27/2016   History of Present Illness  Patient admitted on 20 JAN for worsening SOB x2 days, diagnosed with acute respiratory failure. PMH includes SSS s/p pacemaker w/chronic systolic CHF w/EF [id number], HTN, CKD III, hyperlipidemia, atrial fibrillation, DM, and GERD.  Clinical Impression  Patient is a pleasant female admitted for above listed reasons. On evaluation, patient demonstrates independence with bed mobility, transfers, and modified independence with gait. Patient's oxygen saturations remained >90% for duration of ambulation ~105' without O2 supplementation. Patient will continue to benefit from skilled and progressive PT during hospital stay to improve cardiopulmonary endurance and dynamic balance; however, it is believed that she will not require PT f/u upon discharge.    Follow Up Recommendations No PT follow up    Equipment Recommendations  None recommended by PT    Recommendations for Other Services       Precautions / Restrictions Precautions Precautions: Fall Restrictions Weight Bearing Restrictions: No      Mobility  Bed Mobility Overal bed mobility: Independent             General bed mobility comments: Patient performs bed mobility independently. Recommended log rolling for improved supine to sit transfer.  Transfers Overall transfer level: Modified independent Equipment used: None             General transfer comment: Patient moves from sit to stand and stand to sit with good dynamic balance.  Ambulation/Gait Ambulation/Gait assistance: Min guard Ambulation Distance (Feet): 105 Feet Assistive device: None       General Gait Details: Patient ambulates with no AD at decreased cadence. Demonstrates decreased trunk rotation with shortened step length.  Stairs            Wheelchair Mobility    Modified Rankin (Stroke  Patients Only)       Balance Overall balance assessment: Modified Independent                                           Pertinent Vitals/Pain Pain Assessment: No/denies pain    Home Living Family/patient expects to be discharged to:: Private residence Living Arrangements: Children;Other relatives Available Help at Discharge: Available 24 hours/day Type of Home: House Home Access: Ramped entrance     Home Layout: One level Home Equipment: [place] - 2 wheels;Cane - single point;Bedside commode      Prior Function Level of Independence: Independent         Comments: Patient lives with son and daughter in law but performs cooking/driving independently.     Hand Dominance        Extremity/Trunk Assessment   Upper Extremity Assessment Upper Extremity Assessment: Generalized weakness;LUE deficits/detail LUE Deficits / Details: Pain in L UE due to OA    Lower Extremity Assessment Lower Extremity Assessment: Generalized weakness;Overall WFL for tasks assessed       Communication   Communication: No difficulties  Cognition Arousal/Alertness: Awake/alert Behavior During Therapy: WFL for tasks assessed/performed Overall Cognitive Status: Within Functional Limits for tasks assessed                      General Comments      Exercises     Assessment/Plan    PT Assessment Patient needs continued PT services  PT Problem List Decreased activity tolerance;Decreased mobility;Cardiopulmonary status  limiting activity          PT Treatment Interventions      PT Goals (Current goals can be found in the Care Plan section)  Acute Rehab PT Goals Patient Stated Goal: "To feel better" PT Goal Formulation: With patient Time For Goal Achievement: 08/10/16 Potential to Achieve Goals: Good    Frequency Min 2X/week   Barriers to discharge        Co-evaluation               End of Session Equipment Utilized During Treatment: Gait  belt Activity Tolerance: Patient tolerated treatment well Patient left: in chair;with call bell/phone within reach;with chair alarm set           Time: 1520-1541 PT Time Calculation (min) (ACUTE ONLY): 21 min   Charges:   PT Evaluation $PT Eval Low Complexity: 1 Procedure     PT G Codes:        Dorice Lamas, PT, DPT 07/27/2016, 3:52 PM

## 2016-07-28 LAB — ECHOCARDIOGRAM COMPLETE
AOVTI: 51.8 cm
AV Area VTI index: 0.78 cm2/m2
AV Mean grad: 13 mmHg
AV Peak grad: 23 mmHg
AV area mean vel ind: 0.81 cm2/m2
AV pk vel: 241 cm/s
AV vel: 1.5
AVA: 1.5 cm2
AVAREAMEANV: 1.55 cm2
AVAREAVTI: 1.65 cm2
AVCELMEANRAT: 0.45
Ao pk vel: 0.48 m/s
CHL CUP AV PEAK INDEX: 0.86
CHL CUP DOP CALC LVOT VTI: 22.5 cm
DOP CAL AO MEAN VELOCITY: 166 cm/s
FS: 17 % — AB (ref 28–44)
Height: 61 in
IVS/LV PW RATIO, ED: 1.19
LA diam end sys: 46 mm
LADIAMINDEX: 2.4 cm/m2
LASIZE: 46 mm
LAVOL: 84.8 mL
LAVOLA4C: 76.5 mL
LAVOLIN: 44.2 mL/m2
LDCA: 3.46 cm2
LV PW d: 9.14 mm — AB (ref 0.6–1.1)
LV sys vol index: 71 mL/m2
LVDIAVOL: 194 mL — AB (ref 46–106)
LVDIAVOLIN: 101 mL/m2
LVOT SV: 78 mL
LVOT diameter: 21 mm
LVOT peak grad rest: 5 mmHg
LVOTPV: 115 cm/s
LVOTVTI: 0.43 cm
LVSYSVOL: 136 mL — AB (ref 14–42)
RV LATERAL S' VELOCITY: 5.78 cm/s
RV TAPSE: 9.99 mm
Simpson's disk: 30
Stroke v: 58 ml
Valve area index: 0.78
Weight: 2899.2 oz

## 2016-07-28 LAB — GLUCOSE, CAPILLARY
GLUCOSE-CAPILLARY: 222 mg/dL — AB (ref 65–99)
Glucose-Capillary: 244 mg/dL — ABNORMAL HIGH (ref 65–99)

## 2016-07-28 LAB — BASIC METABOLIC PANEL
Anion gap: 9 (ref 5–15)
BUN: 29 mg/dL — AB (ref 6–20)
CHLORIDE: 96 mmol/L — AB (ref 101–111)
CO2: 31 mmol/L (ref 22–32)
Calcium: 9.4 mg/dL (ref 8.9–10.3)
Creatinine, Ser: 0.97 mg/dL (ref 0.44–1.00)
GFR calc non Af Amer: 52 mL/min — ABNORMAL LOW (ref >60)
Glucose, Bld: 205 mg/dL — ABNORMAL HIGH (ref 65–99)
POTASSIUM: 3.6 mmol/L (ref 3.5–5.1)
SODIUM: 136 mmol/L (ref 135–145)

## 2016-07-28 MED ORDER — FUROSEMIDE 20 MG PO TABS: 20.0000 mg | ORAL_TABLET | Freq: Every day | ORAL | 0 refills | Status: DC

## 2016-07-28 MED ORDER — IPRATROPIUM-ALBUTEROL 0.5-2.5 (3) MG/3ML IN SOLN: 3.0000 mL | RESPIRATORY_TRACT | Status: DC | PRN

## 2016-07-28 NOTE — Care Management Important Message (Signed)
Important Message  Patient Details  Name: Amber [name] MRN: [id number] Date of Birth: 1929/07/15   Medicare Important Message Given:  Yes    Shelbie Ammons, RN 07/28/2016, 12:14 PM

## 2016-07-28 NOTE — Progress Notes (Signed)
Initial Heart Failure Clinic appointment scheduled for August 07, 2016 at 10:00am. Thank you.

## 2016-07-28 NOTE — Care Management (Signed)
Admitted to [facility] with the diagnosis of acute respiratory failure. Lives with daughter-in-law [name] [phone]). Seen Dr. Doy Hutching last December. Home Health per [facility] 2 years ago. [facility] for skilled nursing 2 years ago. No home oxygen. Prescriptions are filled at [facility] on [address]. Last fall was 6 months ago. Good appetite. Wheelchair, bedside commode, and rolling walker in the home. Takes care of all basic activities of daily living herself, drives. [name] will transport.                                                                                                                              Discharge to home today per Dr. Benjie Karvonen. No needs identified.                                                   Shelbie Ammons RN MSN CCM Care Management

## 2016-07-28 NOTE — Discharge Summary (Signed)
[place] [facility] [place] NAME: Amber [name]    MR#:  [id number]  DATE OF BIRTH:  [birth year]  DATE OF ADMISSION:  07/26/2016 ADMITTING PHYSICIAN: Idelle Crouch, MD  DATE OF DISCHARGE: 07/28/2016  PRIMARY CARE PHYSICIAN: SPARKS,JEFFREY D, MD    ADMISSION DIAGNOSIS:  CHF (congestive heart failure) (HCC) [I50.9] SOB (shortness of breath) [R06.02] Hypoxia [R09.02] Acute on chronic congestive heart failure, unspecified congestive heart failure type ([place]) [I50.9]  DISCHARGE DIAGNOSIS:  Principal Problem:   Acute respiratory failure (HCC) Active Problems:   Essential hypertension   Sick sinus syndrome (HCC)   Acute on chronic systolic CHF (congestive heart failure) (HCC)   CHF (congestive heart failure) ([place])   SECONDARY DIAGNOSIS:   Past Medical History:  Diagnosis Date  . Anemia   . Anxiety   . Arthritis   . Benign neoplasm of colon   . CHF (congestive heart failure) ([place])   . Chronic kidney disease   . Chronic pain   . Complication of anesthesia 1980's   hard time waking up  . Coronary artery disease with unspecified angina pectoris   . Cough   . Diabetes mellitus without complication ([place])   . Essential hypertension   . High risk medication use   . Hyperlipidemia   . Plantar fascial fibromatosis   . Presence of permanent cardiac pacemaker   . Shortness of breath dyspnea     HOSPITAL COURSE:  83 year old female with chronic systolic heart failure ejection fraction of 25% presented with shortness of breath.   1. Acute hypoxic respiratory failure in the setting of acute on chronic systolic heart failure She was weaned off of o2.  2. Acute on chronic systolic heart failure EF of 35-40% which has improved since last ECHO. She is Euvolemic and will need low dose lasix at discharge. She has follow up on Friday with PCP and at that visit BMP should be checked.  Continue losartan. Patient has pacemaker may be a  candidate for beta blocker and will be discussed at next cardiology visit. 3. Essential hypertension on losartan and Norvasc  4. Chronic kidney disease stage III with stable creatinine  5. Hyperlipidemia on pravastatin  6. Diabetes: Continue outpatient regimen  DISCHARGE CONDITIONS AND DIET:  Stable Diabetic diet  CONSULTS OBTAINED:  Treatment Team:  Yolonda Kida, MD  DRUG ALLERGIES:   Allergies  Allergen Reactions  . Ambien [Zolpidem Tartrate] Other (See Comments)    Reaction:  Keeps pt awake   . Etodolac Other (See Comments)    Reaction:  Unknown   . Iodine Itching  . Nsaids Other (See Comments)    Reaction:  Unknown   . Penicillins Itching and Other (See Comments)    Has patient had a PCN reaction causing immediate rash, facial/tongue/throat swelling, SOB or lightheadedness with hypotension: No Has patient had a PCN reaction causing severe rash involving mucus membranes or skin necrosis: No Has patient had a PCN reaction that required hospitalization No Has patient had a PCN reaction occurring within the last 10 years: No If all of the above answers are "NO", then may proceed with Cephalosporin use.  . Succinylcholine Other (See Comments)    Reaction:  Unknown     DISCHARGE MEDICATIONS:   Current Discharge Medication List    CONTINUE these medications which have CHANGED   Details  furosemide (LASIX) 20 MG tablet Take 1 tablet (20 mg total) by mouth daily. Qty: 10 tablet, Refills: 0  CONTINUE these medications which have NOT CHANGED   Details  acetaminophen (TYLENOL) 500 MG tablet Take 1,000 mg by mouth every 6 (six) hours as needed for mild pain or headache.    alendronate (FOSAMAX) 70 MG tablet Take 70 mg by mouth every Thursday.     ALPRAZolam (XANAX) 1 MG tablet Take 1 tablet (1 mg total) by mouth at bedtime as needed for anxiety. Qty: 30 tablet, Refills: 0    amLODipine (NORVASC) 5 MG tablet Take 5 mg by mouth daily.    apixaban (ELIQUIS)  5 MG TABS tablet Take 5 mg by mouth 2 (two) times daily.    gabapentin (NEURONTIN) 100 MG capsule Take 200 mg by mouth 3 (three) times daily.     guaiFENesin-dextromethorphan (ROBITUSSIN DM) 100-10 MG/5ML syrup Take 5 mLs by mouth every 4 (four) hours as needed for cough.    insulin regular (NOVOLIN R,HUMULIN R) 100 units/mL injection Inject 10-30 Units into the skin 3 (three) times daily before meals. 15-25 units every morning and 25 units at bedtime    losartan (COZAAR) 100 MG tablet Take 100 mg by mouth daily.    Multiple Vitamins-Minerals (PRESERVISION AREDS 2) CAPS Take 1 capsule by mouth daily.    omeprazole (PRILOSEC) 20 MG capsule Take 20 mg by mouth daily.    pravastatin (PRAVACHOL) 40 MG tablet Take 40 mg by mouth at bedtime.    VENTOLIN HFA 108 (90 Base) MCG/ACT inhaler Inhale 2 puffs into the lungs every 6 (six) hours as needed.      STOP taking these medications     sodium chloride (OCEAN) 0.65 % SOLN nasal spray      HYDROcodone-acetaminophen (NORCO/VICODIN) 5-325 MG tablet      oxyCODONE-acetaminophen (PERCOCET/ROXICET) 5-325 MG per tablet      traMADol (ULTRAM) 50 MG tablet               Today   CHIEF COMPLAINT:   doing well no SOB    VITAL SIGNS:  Blood pressure (!) 121/45, pulse 61, temperature 97.9 F (36.6 C), temperature source Oral, resp. rate 16, height 5\' 1"  (1.549 m), weight 80.2 kg (176 lb 14.4 oz), SpO2 94 %.   REVIEW OF SYSTEMS:  Review of Systems  Constitutional: Negative.  Negative for chills, fever and malaise/fatigue.  HENT: Negative.  Negative for ear discharge, ear pain, hearing loss, nosebleeds and sore throat.   Eyes: Negative.  Negative for blurred vision and pain.  Respiratory: Negative.  Negative for cough, hemoptysis, shortness of breath and wheezing.   Cardiovascular: Negative.  Negative for chest pain, palpitations and leg swelling.  Gastrointestinal: Negative.  Negative for abdominal pain, blood in stool, diarrhea,  nausea and vomiting.  Genitourinary: Negative.  Negative for dysuria.  Musculoskeletal: Negative.  Negative for back pain.  Skin: Negative.   Neurological: Negative for dizziness, tremors, speech change, focal weakness, seizures and headaches.  Endo/Heme/Allergies: Negative.  Does not bruise/bleed easily.  Psychiatric/Behavioral: Negative.  Negative for depression, hallucinations and suicidal ideas.     PHYSICAL EXAMINATION:  GENERAL:  84 y.o.-year-old patient lying in the bed with no acute distress.  NECK:  Supple, no jugular venous distention. No thyroid enlargement, no tenderness.  LUNGS: Normal breath sounds bilaterally, no wheezing, rales,rhonchi  No use of accessory muscles of respiration.  CARDIOVASCULAR: S1, S2 normal. No murmurs, rubs, or gallops.  ABDOMEN: Soft, non-tender, non-distended. Bowel sounds present. No organomegaly or mass.  EXTREMITIES: No pedal edema, cyanosis, or clubbing.  PSYCHIATRIC: The patient is alert  and oriented x 3.  SKIN: No obvious rash, lesion, or ulcer.   DATA REVIEW:   CBC  Recent Labs Lab 07/27/16 0411  WBC 6.9  HGB 11.5*  HCT 34.1*  PLT 225    Chemistries   Recent Labs Lab 07/27/16 0411  07/28/16 0438  NA 138  < > 136  K 3.4*  < > 3.6  CL 100*  < > 96*  CO2 29  < > 31  GLUCOSE 198*  < > 205*  BUN 20  < > 29*  CREATININE 0.81  < > 0.97  CALCIUM 8.8*  < > 9.4  AST 19  --   --   ALT 12*  --   --   ALKPHOS 49  --   --   BILITOT 1.1  --   --   < > = values in this interval not displayed.  Cardiac Enzymes  Recent Labs Lab 07/26/16 1633 07/26/16 2218 07/27/16 0411  TROPONINI 0.05* 0.06* 0.05*    Microbiology Results  @[email or web address] @  RADIOLOGY:  Dg Chest 2 View  Result Date: 07/27/2016 CLINICAL DATA:  Congestive heart failure. EXAM: CHEST  2 VIEW COMPARISON:  July 26, 2016 FINDINGS: Stable pacemaker. Mild edema is slightly improved. The small effusions seen yesterday have improved as well. No other changes.  IMPRESSION: Improving edema and small effusions. Electronically Signed   By: Dorise Bullion III M.D   On: 07/27/2016 08:19      Management plans discussed with the patient and she is in agreement. Stable for discharge home  Patient should follow up with pcp  CODE STATUS:     Code Status Orders        Start     Ordered   07/26/16 1617  Full code  Continuous     07/26/16 1616    Code Status History    Date Active Date Inactive Code Status Order ID Comments User Context   07/25/2015  5:09 PM 07/26/2015  6:01 PM Full Code [id number]  Aldean Jewett, MD Inpatient   03/14/2015  2:33 PM 03/15/2015  6:32 PM Full Code [id number]  Isaias Cowman, MD Inpatient   07/27/2014  3:37 PM 07/31/2014 10:33 PM Full Code [id number]  Erlene Senters, PA-C Inpatient   07/27/2014  5:05 AM 07/27/2014  3:37 PM Full Code [id number]  Etta Quill, DO ED    Advance Directive Documentation   Flowsheet Row Most Recent Value  Type of Advance Directive  Healthcare Power of Attorney  Pre-existing out of facility DNR order (yellow form or pink MOST form)  No data  "MOST" Form in Place?  No data      TOTAL TIME TAKING CARE OF THIS PATIENT: 37 minutes.    Note: This dictation was prepared with Dragon dictation along with smaller phrase technology. Any transcriptional errors that result from this process are unintentional.  Jawuan Robb M.D on 07/28/2016 at 11:29 AM  Between 7am to 6pm - Pager - [phone] After 6pm go to [email or web address] - password [email or web address]  Office  [phone]  CC: Primary care physician; Idelle Crouch, MD

## 2016-07-28 NOTE — Discharge Instructions (Signed)
Heart Failure Heart failure means your heart has trouble pumping blood. This makes it hard for your body to work well. Heart failure is usually a long-term (chronic) condition. You must take good care of yourself and follow your doctor's treatment plan. HOME CARE  Take your heart medicine as told by your doctor.  Do not stop taking medicine unless your doctor tells you to.  Do not skip any dose of medicine.  Refill your medicines before they run out.  Take other medicines only as told by your doctor or pharmacist.  Stay active if told by your doctor. The elderly and people with severe heart failure should talk with a doctor about physical activity.  Eat heart-healthy foods. Choose foods that are without trans fat and are low in saturated fat, cholesterol, and salt (sodium). This includes fresh or frozen fruits and vegetables, fish, lean meats, fat-free or low-fat dairy foods, whole grains, and high-fiber foods. Lentils and dried peas and beans (legumes) are also good choices.  Limit salt if told by your doctor.  Cook in a healthy way. Roast, grill, broil, bake, poach, steam, or stir-fry foods.  Limit fluids as told by your doctor.  Weigh yourself every morning. Do this after you pee (urinate) and before you eat breakfast. Write down your weight to give to your doctor.  Take your blood pressure and write it down if your doctor tells you to.  Ask your doctor how to check your pulse. Check your pulse as told.  Lose weight if told by your doctor.  Stop smoking or chewing tobacco. Do not use gum or patches that help you quit without your doctor's approval.  Schedule and go to doctor visits as told.  Nonpregnant women should have no more than 1 drink a day. Men should have no more than 2 drinks a day. Talk to your doctor about drinking alcohol.  Stop illegal drug use.  Stay current with shots (immunizations).  Manage your health conditions as told by your doctor.  Learn to  manage your stress.  Rest when you are tired.  If it is really hot outside:  Avoid intense activities.  Use air conditioning or fans, or get in a cooler place.  Avoid caffeine and alcohol.  Wear loose-fitting, lightweight, and light-colored clothing.  If it is really cold outside:  Avoid intense activities.  Layer your clothing.  Wear mittens or gloves, a hat, and a scarf when going outside.  Avoid alcohol.  Learn about heart failure and get support as needed.  Get help to maintain or improve your quality of life and your ability to care for yourself as needed. GET HELP IF:   You gain weight quickly.  You are more short of breath than usual.  You cannot do your normal activities.  You tire easily.  You cough more than normal, especially with activity.  You have any or more puffiness (swelling) in areas such as your hands, feet, ankles, or belly (abdomen).  You cannot sleep because it is hard to breathe.  You feel like your heart is beating fast (palpitations).  You get dizzy or light-headed when you stand up. GET HELP RIGHT AWAY IF:   You have trouble breathing.  There is a change in mental status, such as becoming less alert or not being able to focus.  You have chest pain or discomfort.  You faint. MAKE SURE YOU:   Understand these instructions.  Will watch your condition.  Will get help right away if  you are not doing well or get worse. This information is not intended to replace advice given to you by your health care provider. Make sure you discuss any questions you have with your health care provider. Document Released: 04/01/2008 Document Revised: 07/14/2014 Document Reviewed: 08/09/2012 Elsevier Interactive Patient Education  2017 [facility] appointment on August 07, 2016 at 10:00am with Darylene Price, [place]. Please call [phone] to reschedule.

## 2016-07-28 NOTE — Progress Notes (Signed)
Patient discharged via wheelchair and private vehicle. IV removed and catheter intact. All discharge instructions given and patient verbalizes understanding. Tele removed and returned. No prescriptions given to patient No distress noted.   

## 2016-08-01 DIAGNOSIS — I509 Heart failure, unspecified: Secondary | ICD-10-CM | POA: Diagnosis not present

## 2016-08-01 DIAGNOSIS — I5042 Chronic combined systolic (congestive) and diastolic (congestive) heart failure: Secondary | ICD-10-CM | POA: Diagnosis not present

## 2016-08-01 DIAGNOSIS — I10 Essential (primary) hypertension: Secondary | ICD-10-CM | POA: Diagnosis not present

## 2016-08-01 DIAGNOSIS — D649 Anemia, unspecified: Secondary | ICD-10-CM | POA: Diagnosis not present

## 2016-08-01 DIAGNOSIS — G894 Chronic pain syndrome: Secondary | ICD-10-CM | POA: Diagnosis not present

## 2016-08-01 DIAGNOSIS — E119 Type 2 diabetes mellitus without complications: Secondary | ICD-10-CM | POA: Diagnosis not present

## 2016-08-01 DIAGNOSIS — Z79899 Other long term (current) drug therapy: Secondary | ICD-10-CM | POA: Diagnosis not present

## 2016-08-01 DIAGNOSIS — E7800 Pure hypercholesterolemia, unspecified: Secondary | ICD-10-CM | POA: Diagnosis not present

## 2016-08-01 DIAGNOSIS — Z794 Long term (current) use of insulin: Secondary | ICD-10-CM | POA: Diagnosis not present

## 2016-08-06 DIAGNOSIS — I495 Sick sinus syndrome: Secondary | ICD-10-CM | POA: Diagnosis not present

## 2016-08-06 DIAGNOSIS — I25810 Atherosclerosis of coronary artery bypass graft(s) without angina pectoris: Secondary | ICD-10-CM | POA: Diagnosis not present

## 2016-08-06 DIAGNOSIS — R748 Abnormal levels of other serum enzymes: Secondary | ICD-10-CM | POA: Diagnosis not present

## 2016-08-06 DIAGNOSIS — I5023 Acute on chronic systolic (congestive) heart failure: Secondary | ICD-10-CM | POA: Diagnosis not present

## 2016-08-07 ENCOUNTER — Encounter: Payer: Self-pay | Admitting: Family

## 2016-08-07 ENCOUNTER — Ambulatory Visit: Payer: Medicare HMO | Attending: Family | Admitting: Family

## 2016-08-07 VITALS — BP 144/85 | HR 84 | Resp 20 | Ht 61.0 in | Wt 184.4 lb

## 2016-08-07 DIAGNOSIS — E1122 Type 2 diabetes mellitus with diabetic chronic kidney disease: Secondary | ICD-10-CM | POA: Insufficient documentation

## 2016-08-07 DIAGNOSIS — Z951 Presence of aortocoronary bypass graft: Secondary | ICD-10-CM | POA: Insufficient documentation

## 2016-08-07 DIAGNOSIS — N189 Chronic kidney disease, unspecified: Secondary | ICD-10-CM | POA: Diagnosis not present

## 2016-08-07 DIAGNOSIS — D649 Anemia, unspecified: Secondary | ICD-10-CM | POA: Diagnosis not present

## 2016-08-07 DIAGNOSIS — I10 Essential (primary) hypertension: Secondary | ICD-10-CM

## 2016-08-07 DIAGNOSIS — I110 Hypertensive heart disease with heart failure: Secondary | ICD-10-CM | POA: Insufficient documentation

## 2016-08-07 DIAGNOSIS — I25119 Atherosclerotic heart disease of native coronary artery with unspecified angina pectoris: Secondary | ICD-10-CM | POA: Insufficient documentation

## 2016-08-07 DIAGNOSIS — F419 Anxiety disorder, unspecified: Secondary | ICD-10-CM | POA: Diagnosis not present

## 2016-08-07 DIAGNOSIS — I130 Hypertensive heart and chronic kidney disease with heart failure and stage 1 through stage 4 chronic kidney disease, or unspecified chronic kidney disease: Secondary | ICD-10-CM | POA: Insufficient documentation

## 2016-08-07 DIAGNOSIS — Z794 Long term (current) use of insulin: Secondary | ICD-10-CM | POA: Insufficient documentation

## 2016-08-07 DIAGNOSIS — I5022 Chronic systolic (congestive) heart failure: Secondary | ICD-10-CM

## 2016-08-07 DIAGNOSIS — G8929 Other chronic pain: Secondary | ICD-10-CM | POA: Diagnosis not present

## 2016-08-07 DIAGNOSIS — E785 Hyperlipidemia, unspecified: Secondary | ICD-10-CM | POA: Diagnosis not present

## 2016-08-07 DIAGNOSIS — M1990 Unspecified osteoarthritis, unspecified site: Secondary | ICD-10-CM | POA: Insufficient documentation

## 2016-08-07 DIAGNOSIS — Z950 Presence of cardiac pacemaker: Secondary | ICD-10-CM | POA: Diagnosis not present

## 2016-08-07 DIAGNOSIS — I5089 Other heart failure: Secondary | ICD-10-CM | POA: Diagnosis not present

## 2016-08-07 DIAGNOSIS — IMO0001 Reserved for inherently not codable concepts without codable children: Secondary | ICD-10-CM

## 2016-08-07 DIAGNOSIS — E119 Type 2 diabetes mellitus without complications: Secondary | ICD-10-CM

## 2016-08-07 MED ORDER — FUROSEMIDE 20 MG PO TABS: 20.0000 mg | ORAL_TABLET | Freq: Every day | ORAL | 3 refills | Status: DC

## 2016-08-07 NOTE — Patient Instructions (Signed)
Continue weighing daily and call for an overnight weight gain of > 2 pounds or a weekly weight gain of >5 pounds. 

## 2016-08-07 NOTE — Progress Notes (Signed)
Patient ID: Amber [name], female    DOB: [birth year], 83 y.o.   MRN: [id number]  HPI  Amber [name] is a 83 y/o female with a history of hyperlipidemia, HTN, DM, CAD, chronic pain, CKD, arthritis, anxiety, anemia and chronic heart failure.   Last echo was done 07/27/16 and showed an EF of 35-40% along with moderate MR/TR. This is essentially unchanged from a previous echo done August 2017.  Last admitted on 07/26/16 with acute heart failure exacerbation and HTN. Was treated and release after 2 days. Was in the ED on 06/06/16 for left wrist pain. Was evaluated and discharged home.   She presents today for a follow-up visit. Has not been seen in the clinic since August 2016. She has fatigue and shortness of breath with minimal exertion. Symptoms improve quickly upon rest. Denies any swelling in her legs/abdomen. Weighing daily and says that her weight has been stable. Has been under considerable stress with family issues. Is also having some pain in both of her wrists due to arthritis.  Past Medical History:  Diagnosis Date  . Anemia   . Anxiety   . Arthritis   . Benign neoplasm of colon   . CHF (congestive heart failure) ([place])   . Chronic kidney disease   . Chronic pain   . Complication of anesthesia 1980's   hard time waking up  . Coronary artery disease with unspecified angina pectoris   . Cough   . Diabetes mellitus without complication ([name])   . Essential hypertension   . High risk medication use   . Hyperlipidemia   . Plantar fascial fibromatosis   . Presence of permanent cardiac pacemaker   . Shortness of breath dyspnea    Past Surgical History:  Procedure Laterality Date  . ABDOMINAL HYSTERECTOMY    . BACK SURGERY  1960's   cage and screws in lower back  . CATARACT EXTRACTION W/ INTRAOCULAR LENS  IMPLANT, BILATERAL Bilateral   . CORONARY ARTERY BYPASS GRAFT  2008  . JOINT REPLACEMENT Right    hip and knee  . ORIF ANKLE FRACTURE Right 07/27/2014   Procedure: OPEN  REDUCTION INTERNAL FIXATION (ORIF) ANKLE FRACTURE;  Surgeon: Alta Corning, MD;  Location: [facility];  Service: Orthopedics;  Laterality: Right;  . PACEMAKER INSERTION Left 03/14/2015   Procedure: INSERTION PACEMAKER;  Surgeon: Isaias Cowman, MD;  Location: [facility];  Service: Cardiovascular;  Laterality: Left;  . ROTATOR CUFF REPAIR    . TOTAL HIP ARTHROPLASTY Right   . TOTAL KNEE ARTHROPLASTY Right   . VEIN LIGATION AND STRIPPING     Family History  Problem Relation Age of Onset  . Heart attack Mother   . Heart disease Father   . Alzheimer's disease Sister   . Cervical cancer Sister   . Heart failure Son    Social History  Substance Use Topics  . Smoking status: Never Smoker  . Smokeless tobacco: Never Used  . Alcohol use No   Allergies  Allergen Reactions  . Ambien [Zolpidem Tartrate] Other (See Comments)    Reaction:  Keeps pt awake   . Etodolac Other (See Comments)    Reaction:  Unknown   . Iodine Itching  . Nsaids Other (See Comments)    Reaction:  Unknown   . Penicillins Itching and Other (See Comments)    Has patient had a PCN reaction causing immediate rash, facial/tongue/throat swelling, SOB or lightheadedness with hypotension: No Has patient had a PCN reaction causing severe rash  involving mucus membranes or skin necrosis: No Has patient had a PCN reaction that required hospitalization No Has patient had a PCN reaction occurring within the last 10 years: No If all of the above answers are "NO", then may proceed with Cephalosporin use.  . Succinylcholine Other (See Comments)    Reaction:  Unknown    Prior to Admission medications   Medication Sig Start Date End Date Taking? Authorizing Provider  alendronate (FOSAMAX) 70 MG tablet Take 70 mg by mouth every Thursday.    Yes Historical Provider, MD  ALPRAZolam Duanne Moron) 1 MG tablet Take 1 tablet (1 mg total) by mouth at bedtime as needed for anxiety. Patient taking differently: Take 0.5 mg by mouth at bedtime as  needed for anxiety.  07/31/14  Yes Silver Huguenin Elgergawy, MD  amLODipine (NORVASC) 10 MG tablet Take 10 mg by mouth daily.   Yes Historical Provider, MD  apixaban (ELIQUIS) 5 MG TABS tablet Take 5 mg by mouth 2 (two) times daily.   Yes Historical Provider, MD  calcium carbonate (OSCAL) 1500 (600 Ca) MG TABS tablet Take 600 mg of elemental calcium by mouth daily with breakfast.   Yes Historical Provider, MD  furosemide (LASIX) 20 MG tablet Take 1 tablet (20 mg total) by mouth daily. 08/07/16 08/07/17 Yes Alisa Graff, FNP  gabapentin (NEURONTIN) 100 MG capsule Take 200 mg by mouth 3 (three) times daily.    Yes Historical Provider, MD  insulin aspart protamine- aspart (NOVOLOG MIX 70/30) (70-30) 100 UNIT/ML injection Inject 10 Units into the skin 2 (two) times daily with a meal.   Yes Historical Provider, MD  losartan (COZAAR) 100 MG tablet Take 100 mg by mouth daily.   Yes Historical Provider, MD  Multiple Vitamins-Minerals (PRESERVISION AREDS 2) CAPS Take 1 capsule by mouth daily.   Yes Historical Provider, MD  omeprazole (PRILOSEC) 20 MG capsule Take 20 mg by mouth daily.   Yes Historical Provider, MD  pravastatin (PRAVACHOL) 40 MG tablet Take 40 mg by mouth at bedtime.   Yes Historical Provider, MD  VENTOLIN HFA 108 (90 Base) MCG/ACT inhaler Inhale 2 puffs into the lungs every 6 (six) hours as needed. 06/01/16  Yes Historical Provider, MD    Review of Systems  Constitutional: Positive for fatigue. Negative for appetite change.  HENT: Positive for congestion (sometimes at night). Negative for postnasal drip and sore throat.   Eyes: Negative.   Respiratory: Positive for cough and shortness of breath. Negative for chest tightness and wheezing.   Cardiovascular: Negative for chest pain, palpitations and leg swelling.  Gastrointestinal: Negative for abdominal distention and abdominal pain.  Endocrine: Negative.   Genitourinary: Negative.   Musculoskeletal: Positive for arthralgias (bilateral wrists due  to arthritis). Negative for back pain.  Skin: Negative.   Allergic/Immunologic: Negative.   Neurological: Positive for numbness (in feet due to neuropathy). Negative for dizziness and light-headedness.  Hematological: Negative for adenopathy. Does not bruise/bleed easily.  Psychiatric/Behavioral: Positive for sleep disturbance (trouble falling asleep due to stress). Negative for dysphoric mood and suicidal ideas. The patient is nervous/anxious.    Vitals:   08/07/16 1024  BP: (!) 144/85  Pulse: 84  Resp: 20  SpO2: 98%  Weight: 184 lb 6 oz (83.6 kg)  Height: 5\' 1"  (1.549 m)   Wt Readings from Last 3 Encounters:  08/07/16 184 lb 6 oz (83.6 kg)  07/28/16 176 lb 14.4 oz (80.2 kg)  06/06/16 180 lb (81.6 kg)   Lab Results  Component Value Date  CREATININE 0.97 07/28/2016   CREATININE 0.90 07/27/2016   CREATININE 0.81 07/27/2016   Physical Exam  Constitutional: She is oriented to person, place, and time. She appears well-developed and well-nourished.  HENT:  Head: Normocephalic and atraumatic.  Eyes: Conjunctivae are normal. Pupils are equal, round, and reactive to light.  Neck: Normal range of motion. Neck supple. No JVD present.  Cardiovascular: Normal rate and regular rhythm.   Pulmonary/Chest: Effort normal. She has no wheezes. She has no rales.  Abdominal: Soft. She exhibits no distension. There is no tenderness.  Musculoskeletal: She exhibits no edema or tenderness.  Neurological: She is alert and oriented to person, place, and time.  Skin: Skin is warm and dry.  Psychiatric: She has a normal mood and affect. Her behavior is normal. Thought content normal.  Nursing note and vitals reviewed.   Assessment & Plan:  1: Chronic heart failure with reduced ejection fraction- - NYHA class III - euvolemic - already weighing daily. Reminded to call for an overnight weight gain of >2 pounds or a weekly weight gain of >5 pounds - not adding salt to her food and is trying to eat  low sodium foods - discussed changing her losartan to entresto and a brochure was given to her about entresto - saw her cardiologist Nehemiah Massed) 08/06/16 and returns to him 09/03/16  2: HTN- - BP looks good today - continue medications at this time - saw PCP Doy Hutching) 08/01/16 and returns to him April 2018  3: Diabetes- - glucose today was 238 - insulin has recently been increased  4: Anxiety- - admits to being under a lot of family stress with financial issues related to deaths in her family - does take 0.5mg  alprazolam at bedtime on occasion so that she can settle her mind down to sleep - denies any suicidal thoughts  Medication list was reviewed and she was encouraged to take the printed med list home and check it for accuracy against her actual bottles.  Return here in 3 months or sooner for any questions/problems before then.

## 2016-08-08 DIAGNOSIS — F419 Anxiety disorder, unspecified: Secondary | ICD-10-CM | POA: Insufficient documentation

## 2016-09-03 DIAGNOSIS — W19XXXS Unspecified fall, sequela: Secondary | ICD-10-CM | POA: Diagnosis not present

## 2016-09-03 DIAGNOSIS — Y92009 Unspecified place in unspecified non-institutional (private) residence as the place of occurrence of the external cause: Secondary | ICD-10-CM | POA: Diagnosis not present

## 2016-09-03 DIAGNOSIS — I10 Essential (primary) hypertension: Secondary | ICD-10-CM | POA: Diagnosis not present

## 2016-09-03 DIAGNOSIS — I5022 Chronic systolic (congestive) heart failure: Secondary | ICD-10-CM | POA: Diagnosis not present

## 2016-09-03 DIAGNOSIS — I25810 Atherosclerosis of coronary artery bypass graft(s) without angina pectoris: Secondary | ICD-10-CM | POA: Diagnosis not present

## 2016-09-19 ENCOUNTER — Emergency Department: Payer: Medicare HMO

## 2016-09-19 ENCOUNTER — Emergency Department
Admission: EM | Admit: 2016-09-19 | Discharge: 2016-09-19 | Disposition: A | Discharge status: Home / Self Care | Payer: Medicare HMO | Attending: Emergency Medicine | Admitting: Emergency Medicine

## 2016-09-19 DIAGNOSIS — Z951 Presence of aortocoronary bypass graft: Secondary | ICD-10-CM | POA: Diagnosis not present

## 2016-09-19 DIAGNOSIS — Z79899 Other long term (current) drug therapy: Secondary | ICD-10-CM | POA: Insufficient documentation

## 2016-09-19 DIAGNOSIS — I509 Heart failure, unspecified: Secondary | ICD-10-CM | POA: Diagnosis not present

## 2016-09-19 DIAGNOSIS — S40011A Contusion of right shoulder, initial encounter: Secondary | ICD-10-CM | POA: Diagnosis not present

## 2016-09-19 DIAGNOSIS — Y999 Unspecified external cause status: Secondary | ICD-10-CM | POA: Diagnosis not present

## 2016-09-19 DIAGNOSIS — S0990XA Unspecified injury of head, initial encounter: Secondary | ICD-10-CM | POA: Insufficient documentation

## 2016-09-19 DIAGNOSIS — N189 Chronic kidney disease, unspecified: Secondary | ICD-10-CM | POA: Insufficient documentation

## 2016-09-19 DIAGNOSIS — S8001XA Contusion of right knee, initial encounter: Secondary | ICD-10-CM | POA: Insufficient documentation

## 2016-09-19 DIAGNOSIS — W01198A Fall on same level from slipping, tripping and stumbling with subsequent striking against other object, initial encounter: Secondary | ICD-10-CM | POA: Insufficient documentation

## 2016-09-19 DIAGNOSIS — Z794 Long term (current) use of insulin: Secondary | ICD-10-CM | POA: Diagnosis not present

## 2016-09-19 DIAGNOSIS — I130 Hypertensive heart and chronic kidney disease with heart failure and stage 1 through stage 4 chronic kidney disease, or unspecified chronic kidney disease: Secondary | ICD-10-CM | POA: Insufficient documentation

## 2016-09-19 DIAGNOSIS — Y929 Unspecified place or not applicable: Secondary | ICD-10-CM | POA: Diagnosis not present

## 2016-09-19 DIAGNOSIS — I2510 Atherosclerotic heart disease of native coronary artery without angina pectoris: Secondary | ICD-10-CM | POA: Insufficient documentation

## 2016-09-19 DIAGNOSIS — Y939 Activity, unspecified: Secondary | ICD-10-CM | POA: Diagnosis not present

## 2016-09-19 DIAGNOSIS — E1122 Type 2 diabetes mellitus with diabetic chronic kidney disease: Secondary | ICD-10-CM | POA: Insufficient documentation

## 2016-09-19 DIAGNOSIS — M7989 Other specified soft tissue disorders: Secondary | ICD-10-CM | POA: Diagnosis not present

## 2016-09-19 DIAGNOSIS — S8991XA Unspecified injury of right lower leg, initial encounter: Secondary | ICD-10-CM | POA: Diagnosis present

## 2016-09-19 DIAGNOSIS — M25461 Effusion, right knee: Secondary | ICD-10-CM | POA: Diagnosis not present

## 2016-09-19 DIAGNOSIS — Z950 Presence of cardiac pacemaker: Secondary | ICD-10-CM | POA: Diagnosis not present

## 2016-09-19 DIAGNOSIS — M25511 Pain in right shoulder: Secondary | ICD-10-CM | POA: Diagnosis not present

## 2016-09-19 MED ORDER — TRAMADOL HCL 50 MG PO TABS
50.0000 mg | ORAL_TABLET | Freq: Once | ORAL | Status: AC
  Administered 2016-09-19: 50 mg via ORAL
  Filled 2016-09-19: qty 1

## 2016-09-19 MED ORDER — TRAMADOL HCL 50 MG PO TABS: 50.0000 mg | ORAL_TABLET | Freq: Four times a day (QID) | ORAL | 0 refills | Status: DC | PRN

## 2016-09-19 NOTE — ED Triage Notes (Signed)
Pt reports to ED w/ c/o mechanical fall that occurred last night.  Pt sts that she tripped over rug, sts that this is 2nd incident this week, pt sts that she does not use cane/walker as she's supposed too. Pt c/o R shoulder and knee pain.  Pt sts that she hit head and is on a blood thinner, denies h/a OR chngs in vision.  Pt A/OX4, resp even and unlabored.  NAD

## 2016-09-19 NOTE — ED Provider Notes (Signed)
Time Seen: Approximately 1809  I have reviewed the triage notes  Chief Complaint: Fall   History of Present Illness: Amber [name] is a 83 y.o. female  who presents after she had a mechanical fall last night. Patient states she tripped over some carpet and landed directly on her right knee, struck the right side of her head, and also her right shoulder. She denies any loss of consciousness. She denies any neck, thoracic, lumbar spine pain. She's had previous knee replacement surgery and has extensive bruising and swelling over this area. She denies any limited range of motion with her right shoulder. She denies any chest or abdominal pain or trauma.  Past Medical History:  Diagnosis Date  . Anemia   . Anxiety   . Arthritis   . Benign neoplasm of colon   . CHF (congestive heart failure) ([place])   . Chronic kidney disease   . Chronic pain   . Complication of anesthesia 1980's   hard time waking up  . Coronary artery disease with unspecified angina pectoris   . Cough   . Diabetes mellitus without complication ([place])   . Essential hypertension   . High risk medication use   . Hyperlipidemia   . Plantar fascial fibromatosis   . Presence of permanent cardiac pacemaker   . Shortness of breath dyspnea     Patient Active Problem List   Diagnosis Date Noted  . Anxiety 08/08/2016  . TIA (transient ischemic attack) 07/25/2015  . Sick sinus syndrome ([place]) 03/14/2015  . Bradycardia 02/16/2015  . Chronic heart failure ([place]) 01/05/2015  . Fracture dislocation of ankle 07/27/2014  . IDDM (insulin dependent diabetes mellitus) ([place]) 07/27/2014  . Essential hypertension 07/27/2014  . Multiple rib fractures 07/27/2014  . 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 ABDOMINAL HYSTERECTOMY    . BACK SURGERY  1960's   cage and screws in lower back  . CATARACT EXTRACTION W/ INTRAOCULAR LENS  IMPLANT, BILATERAL Bilateral   . CORONARY ARTERY  BYPASS GRAFT  2008  . JOINT REPLACEMENT Right    hip and knee  . ORIF ANKLE FRACTURE Right 07/27/2014   Procedure: OPEN REDUCTION INTERNAL FIXATION (ORIF) ANKLE FRACTURE;  Surgeon: Alta Corning, MD;  Location: [facility];  Service: Orthopedics;  Laterality: Right;  . PACEMAKER INSERTION Left 03/14/2015   Procedure: INSERTION PACEMAKER;  Surgeon: Isaias Cowman, MD;  Location: [facility];  Service: Cardiovascular;  Laterality: Left;  . ROTATOR CUFF REPAIR    . TOTAL HIP ARTHROPLASTY Right   . TOTAL KNEE ARTHROPLASTY Right   . VEIN LIGATION AND STRIPPING      Past Surgical History:  Procedure Laterality Date  . ABDOMINAL HYSTERECTOMY    . BACK SURGERY  1960's   cage and screws in lower back  . CATARACT EXTRACTION W/ INTRAOCULAR LENS  IMPLANT, BILATERAL Bilateral   . CORONARY ARTERY BYPASS GRAFT  2008  . JOINT REPLACEMENT Right    hip and knee  . ORIF ANKLE FRACTURE Right 07/27/2014   Procedure: OPEN REDUCTION INTERNAL FIXATION (ORIF) ANKLE FRACTURE;  Surgeon: Alta Corning, MD;  Location: [facility];  Service: Orthopedics;  Laterality: Right;  . PACEMAKER INSERTION Left 03/14/2015   Procedure: INSERTION PACEMAKER;  Surgeon: Isaias Cowman, MD;  Location: [facility];  Service: Cardiovascular;  Laterality: Left;  . ROTATOR CUFF REPAIR    . TOTAL HIP ARTHROPLASTY Right   . TOTAL KNEE ARTHROPLASTY Right   .  VEIN LIGATION AND STRIPPING      Current Outpatient Rx  . Order #: [id number] Class: Historical Med  . Order #: [id number] Class: Print  . Order #: [id number] Class: Historical Med  . Order #: [id number] Class: Historical Med  . Order #: [id number] Class: Historical Med  . Order #: [id number] Class: Normal  . Order #: [id number] Class: Historical Med  . Order #: [id number] Class: Historical Med  . Order #: [id number] Class: Historical Med  . Order #: [id number] Class: Historical Med  . Order #: [id number] Class: Historical Med  . Order #: [id number] Class: Historical Med  . Order #: [id number] Class: Print   . Order #: [id number] Class: Historical Med    Allergies:  Ambien [zolpidem tartrate]; Etodolac; Iodine; Nsaids; Penicillins; and Succinylcholine  Family History: Family History  Problem Relation Age of Onset  . Heart attack Mother   . Heart disease Father   . Alzheimer's disease Sister   . Cervical cancer Sister   . Heart failure Son     Social History: Social History  Substance Use Topics  . Smoking status: Never Smoker  . Smokeless tobacco: Never Used  . Alcohol use No     Review of Systems:   10 point review of systems was performed and was otherwise negative:  Constitutional: No fever Eyes: No visual disturbances. ENT: No sore throat, ear pain Cardiac: No chest pain Respiratory: No shortness of breath, wheezing, or stridor Abdomen: No abdominal pain, no vomiting, No diarrhea Endocrine: No weight loss, No night sweats Extremities: Pain and swelling in the right knee  Skin: No rashes, easy bruising Neurologic: No focal weakness, trouble with speech or swollowing. No headache Urologic: No dysuria, Hematuria, or urinary frequency   Physical Exam:  ED Triage Vitals  Enc Vitals Group     BP 09/19/16 1832 (!) 162/71     Pulse Rate 09/19/16 1551 68     Resp 09/19/16 1551 18     Temp 09/19/16 1551 98.2 F (36.8 C)     Temp Source 09/19/16 1551 Oral     SpO2 09/19/16 1551 95 %     Weight 09/19/16 1554 177 lb (80.3 kg)     Height 09/19/16 1554 5\' 1"  (1.549 m)     Head Circumference --      Peak Flow --      Pain Score 09/19/16 1554 5     Pain Loc --      Pain Edu? --      Excl. in [place]? --     General: Awake , Alert , and Oriented times 3; GCS 15 Head: Normal cephalic , atraumatic. No crepitus or step-off noted no obvious hematoma Eyes: Pupils equal , round, reactive to light Nose/Throat: No nasal drainage, patent upper airway without erythema or exudate.  Neck: Supple, Full range of motion, No anterior adenopathy or palpable thyroid masses Lungs: Clear to  ascultation without wheezes , rhonchi, or rales Heart: Regular rate, regular rhythm without murmurs , gallops , or rubs Abdomen: Soft, non tender without rebound, guarding , or rigidity; bowel sounds positive and symmetric in all 4 quadrants. No organomegaly .        Extremities: Right shoulder shows full range of motion with a contusion over this area without any crepitus or step-off noted and otherwise neurovascularly intact with no limited range of motion Right knee shows a large contusion and bruise the patella extending superiorly and inferiorly. Tenderness over this area without any erythema. No crepitus or step-off noted Neurologic: normal ambulation, Motor symmetric without deficits, sensory intact  Skin: warm, dry, no rashes   Radiology:  "Dg Shoulder Right  Result Date: 09/19/2016 CLINICAL DATA:  Right shoulder pain after fall yesterday. EXAM: RIGHT SHOULDER - 2+ VIEW COMPARISON:  08/22/2015 right humerus radiographs FINDINGS: Visualized median sternotomy wires appear intact. Single lead left subclavian pacemaker partially visualized. No fracture. No dislocation at the right glenohumeral joint. No evidence of right acromioclavicular joint separation. Moderate to severe osteoarthritis in the right glenohumeral joint. Small subacromial spur. Soft tissue calcifications adjacent to the lateral right humeral head. IMPRESSION: 1. No fracture or malalignment. 2. Moderate to severe right glenohumeral joint osteoarthritis. 3. Soft tissue calcifications adjacent to the lateral right humeral head, which may indicate calcific tendinopathy/bursitis. Electronically Signed   By: Ilona Sorrel M.D.   On: 09/19/2016 16:54   Dg Knee Complete 4 Views Right  Result Date: 09/19/2016 CLINICAL DATA:  Anterior knee pain and swelling.  Status post fall. EXAM: RIGHT KNEE - COMPLETE 4+ VIEW COMPARISON:  07/27/2014 FINDINGS: Previous right total knee arthroplasty. No fracture or subluxation identified. There is a small  suprapatellar joint effusion. Diffuse soft tissue swelling noted. IMPRESSION: 1. Joint effusion. 2. Diffuse soft tissue swelling. Electronically Signed   By: Kerby Moors M.D.   On: 09/19/2016 16:53  " * I personally reviewed the radiologic studies     ED Course: ** Patient had an application of a right knee immobilizer by the nursing staff.  patient was discharged with instructions on contusions and mild acute closed head injury. She does not appear to have any concussive symptoms and I felt head CT was not necessary at this time though cautioned if she has an increased headache, blurred vision, and persistent nausea or vomiting or any other new concerns to not hesitate to return here to the emergency department.   Assessment: Mild acute closed head injury Right shoulder contusion Right knee contusion   Final Clinical Impression:   Final diagnoses:  Contusion of right knee, initial encounter  Minor head injury, initial encounter     Plan:  Outpatient Patient was advised to return immediately if condition worsens. Patient was advised to follow up with their primary care physician or other specialized physicians involved in their outpatient care. The patient and/or family member/power of attorney had laboratory results reviewed at the bedside. All questions and concerns were addressed and appropriate discharge instructions were distributed by the nursing staff.             Daymon Larsen, MD 09/19/16 2113

## 2016-09-19 NOTE — ED Notes (Signed)
Pt discharged home after verbalizing understanding of discharge instructions; nad noted. 

## 2016-09-19 NOTE — ED Notes (Signed)
Pt presents with pain following a mechanical fall yesterday. She c/o pain to her right knee and right shoulder. Pt is on Eliquis. She hit her head but denies loc, blurred vision, other problems. Pt alert & oriented with NAD noted.

## 2016-09-19 NOTE — Discharge Instructions (Signed)
Please return immediately if condition worsens. Please contact her primary physician or the physician you were given for referral. If you have any specialist physicians involved in her treatment and plan please also contact them. Thank you for using [facility].  Please take over-the-counter Tylenol for pain. Rest, ice, and elevate the right knee and to use the knee immobilizer when up and ambulatory. Return emergency department especially for any increasing headache, nausea and vomiting, weakness or any other concerns.

## 2016-10-06 ENCOUNTER — Encounter: Payer: Self-pay | Admitting: Family

## 2016-10-06 ENCOUNTER — Ambulatory Visit: Payer: Medicare HMO | Attending: Family | Admitting: Family

## 2016-10-06 VITALS — BP 143/75 | HR 85 | Resp 18 | Ht 61.0 in | Wt 184.5 lb

## 2016-10-06 DIAGNOSIS — Z951 Presence of aortocoronary bypass graft: Secondary | ICD-10-CM | POA: Diagnosis not present

## 2016-10-06 DIAGNOSIS — Z950 Presence of cardiac pacemaker: Secondary | ICD-10-CM | POA: Insufficient documentation

## 2016-10-06 DIAGNOSIS — Z794 Long term (current) use of insulin: Secondary | ICD-10-CM | POA: Diagnosis not present

## 2016-10-06 DIAGNOSIS — I130 Hypertensive heart and chronic kidney disease with heart failure and stage 1 through stage 4 chronic kidney disease, or unspecified chronic kidney disease: Secondary | ICD-10-CM | POA: Insufficient documentation

## 2016-10-06 DIAGNOSIS — I10 Essential (primary) hypertension: Secondary | ICD-10-CM

## 2016-10-06 DIAGNOSIS — I25119 Atherosclerotic heart disease of native coronary artery with unspecified angina pectoris: Secondary | ICD-10-CM | POA: Insufficient documentation

## 2016-10-06 DIAGNOSIS — E785 Hyperlipidemia, unspecified: Secondary | ICD-10-CM | POA: Diagnosis not present

## 2016-10-06 DIAGNOSIS — W19XXXS Unspecified fall, sequela: Secondary | ICD-10-CM

## 2016-10-06 DIAGNOSIS — N189 Chronic kidney disease, unspecified: Secondary | ICD-10-CM | POA: Diagnosis not present

## 2016-10-06 DIAGNOSIS — I5022 Chronic systolic (congestive) heart failure: Secondary | ICD-10-CM | POA: Insufficient documentation

## 2016-10-06 DIAGNOSIS — G8929 Other chronic pain: Secondary | ICD-10-CM | POA: Diagnosis not present

## 2016-10-06 DIAGNOSIS — F419 Anxiety disorder, unspecified: Secondary | ICD-10-CM | POA: Insufficient documentation

## 2016-10-06 DIAGNOSIS — E119 Type 2 diabetes mellitus without complications: Secondary | ICD-10-CM

## 2016-10-06 DIAGNOSIS — M1990 Unspecified osteoarthritis, unspecified site: Secondary | ICD-10-CM | POA: Diagnosis not present

## 2016-10-06 DIAGNOSIS — D649 Anemia, unspecified: Secondary | ICD-10-CM | POA: Diagnosis not present

## 2016-10-06 DIAGNOSIS — W19XXXA Unspecified fall, initial encounter: Secondary | ICD-10-CM | POA: Insufficient documentation

## 2016-10-06 DIAGNOSIS — E1122 Type 2 diabetes mellitus with diabetic chronic kidney disease: Secondary | ICD-10-CM | POA: Diagnosis not present

## 2016-10-06 DIAGNOSIS — IMO0001 Reserved for inherently not codable concepts without codable children: Secondary | ICD-10-CM

## 2016-10-06 NOTE — Progress Notes (Signed)
Patient ID: Amber [name], female    DOB: August 27, 1929, 83 y.o.   MRN: [id number]  HPI  Amber [name] is a 83 y/o female with a history of hyperlipidemia, HTN, DM, CAD, chronic pain, CKD, arthritis, anxiety, anemia and chronic heart failure.   Last echo was done 07/27/16 and showed an EF of 35-40% along with moderate MR/TR. This is essentially unchanged from a previous echo done August 2017.  Came to the ED on 09/19/16 after a mechanical fall. She tripped over some carpet and fell onto her right knee. Evaluated and discharged home. Last admitted on 07/26/16 with acute heart failure exacerbation and HTN. Was treated and release after 2 days. Was in the ED on 06/06/16 for left wrist pain. Was evaluated and discharged home.   She presents today for a follow-up visit. She has fatigue and shortness of breath with minimal exertion. Symptoms improve quickly upon rest. Denies any swelling in her legs/abdomen. Hasn't been weighing daily. Has been under considerable stress with family issues. Having some pain in her right knee that she fell on but feels like the pain is improving.   Past Medical History:  Diagnosis Date  . Anemia   . Anxiety   . Arthritis   . Benign neoplasm of colon   . CHF (congestive heart failure) ([place])   . Chronic kidney disease   . Chronic pain   . Complication of anesthesia 1980's   hard time waking up  . Coronary artery disease with unspecified angina pectoris   . Cough   . Diabetes mellitus without complication ([place])   . Essential hypertension   . High risk medication use   . Hyperlipidemia   . Plantar fascial fibromatosis   . Presence of permanent cardiac pacemaker   . Shortness of breath dyspnea    Past Surgical History:  Procedure Laterality Date  . ABDOMINAL HYSTERECTOMY    . BACK SURGERY  1960's   cage and screws in lower back  . CATARACT EXTRACTION W/ INTRAOCULAR LENS  IMPLANT, BILATERAL Bilateral   . CORONARY ARTERY BYPASS GRAFT  2008  . JOINT REPLACEMENT  Right    hip and knee  . ORIF ANKLE FRACTURE Right 07/27/2014   Procedure: OPEN REDUCTION INTERNAL FIXATION (ORIF) ANKLE FRACTURE;  Surgeon: Alta Corning, MD;  Location: [place] [facility];  Service: Orthopedics;  Laterality: Right;  . PACEMAKER INSERTION Left 03/14/2015   Procedure: INSERTION PACEMAKER;  Surgeon: Isaias Cowman, MD;  Location: [facility];  Service: Cardiovascular;  Laterality: Left;  . ROTATOR CUFF REPAIR    . TOTAL HIP ARTHROPLASTY Right   . TOTAL KNEE ARTHROPLASTY Right   . VEIN LIGATION AND STRIPPING     Family History  Problem Relation Age of Onset  . Heart attack Mother   . Heart disease Father   . Alzheimer's disease Sister   . Cervical cancer Sister   . Heart failure Son    Social History  Substance Use Topics  . Smoking status: Never Smoker  . Smokeless tobacco: Never Used  . Alcohol use No   Allergies  Allergen Reactions  . Ambien [Zolpidem Tartrate] Other (See Comments)    Reaction:  Keeps pt awake   . Etodolac Other (See Comments)    Reaction:  Unknown   . Iodine Itching  . Nsaids Other (See Comments)    Reaction:  Unknown   . Penicillins Itching and Other (See Comments)    Has patient had a PCN reaction causing immediate rash, facial/tongue/throat swelling,  SOB or lightheadedness with hypotension: No Has patient had a PCN reaction causing severe rash involving mucus membranes or skin necrosis: No Has patient had a PCN reaction that required hospitalization No Has patient had a PCN reaction occurring within the last 10 years: No If all of the above answers are "NO", then may proceed with Cephalosporin use.  . Succinylcholine Other (See Comments)    Reaction:  Unknown    Prior to Admission medications   Medication Sig Start Date End Date Taking? Authorizing Provider  alendronate (FOSAMAX) 70 MG tablet Take 70 mg by mouth every Thursday.    Yes Historical Provider, MD  ALPRAZolam Duanne Moron) 1 MG tablet Take 1 tablet (1 mg total) by mouth at bedtime as needed  for anxiety. Patient taking differently: Take 0.5 mg by mouth at bedtime as needed for anxiety.  07/31/14  Yes Silver Huguenin Elgergawy, MD  amLODipine (NORVASC) 10 MG tablet Take 5 mg by mouth daily.    Yes Historical Provider, MD  apixaban (ELIQUIS) 5 MG TABS tablet Take 5 mg by mouth 2 (two) times daily.   Yes Historical Provider, MD  calcium carbonate (OSCAL) 1500 (600 Ca) MG TABS tablet Take 600 mg of elemental calcium by mouth daily with breakfast.   Yes Historical Provider, MD  furosemide (LASIX) 20 MG tablet Take 1 tablet (20 mg total) by mouth daily. 08/07/16 08/07/17 Yes Alisa Graff, FNP  gabapentin (NEURONTIN) 100 MG capsule Take 200 mg by mouth 3 (three) times daily.    Yes Historical Provider, MD  insulin aspart protamine- aspart (NOVOLOG MIX 70/30) (70-30) 100 UNIT/ML injection Inject 10 Units into the skin 2 (two) times daily with a meal.   Yes Historical Provider, MD  losartan (COZAAR) 100 MG tablet Take 100 mg by mouth daily.   Yes Historical Provider, MD  Multiple Vitamins-Minerals (PRESERVISION AREDS 2) CAPS Take 1 capsule by mouth daily.   Yes Historical Provider, MD  omeprazole (PRILOSEC) 20 MG capsule Take 20 mg by mouth daily.   Yes Historical Provider, MD  pravastatin (PRAVACHOL) 40 MG tablet Take 40 mg by mouth at bedtime.   Yes Historical Provider, MD  traMADol (ULTRAM) 50 MG tablet Take 1 tablet (50 mg total) by mouth every 6 (six) hours as needed. 09/19/16  Yes Daymon Larsen, MD  VENTOLIN HFA 108 [phone] Base) MCG/ACT inhaler Inhale 2 puffs into the lungs every 6 (six) hours as needed. 06/01/16  Yes Historical Provider, MD    Review of Systems  Constitutional: Positive for fatigue. Negative for appetite change.  HENT: Negative for congestion, postnasal drip and sore throat.   Eyes: Negative.   Respiratory: Positive for cough and shortness of breath. Negative for chest tightness.   Cardiovascular: Negative for chest pain, palpitations and leg swelling.  Gastrointestinal:  Negative for abdominal distention and abdominal pain.  Endocrine: Negative.   Genitourinary: Negative.   Musculoskeletal: Positive for arthralgias (right knee pain). Negative for neck pain.  Skin: Negative.   Allergic/Immunologic: Negative.   Neurological: Positive for light-headedness (after the fall) and numbness (right lower leg).  Hematological: Negative for adenopathy. Bruises/bleeds easily.  Psychiatric/Behavioral: Positive for sleep disturbance (not sleeping well). Negative for dysphoric mood. The patient is nervous/anxious.    Vitals:   10/06/16 1105  BP: (!) 143/75  Pulse: 85  Resp: 18  SpO2: 96%  Weight: 184 lb 8 oz (83.7 kg)  Height: 5\' 1"  (1.549 m)   Wt Readings from Last 3 Encounters:  10/06/16 184 lb 8 oz (  83.7 kg)  09/19/16 177 lb (80.3 kg)  08/07/16 184 lb 6 oz (83.6 kg)   Lab Results  Component Value Date   CREATININE 0.97 07/28/2016   CREATININE 0.90 07/27/2016   CREATININE 0.81 07/27/2016    Physical Exam  Constitutional: She is oriented to person, place, and time. She appears well-developed and well-nourished.  HENT:  Head: Normocephalic and atraumatic.  Eyes: Conjunctivae are normal. Pupils are equal, round, and reactive to light.  Neck: Normal range of motion. Neck supple. No JVD present.  Cardiovascular: Normal rate and regular rhythm.   Pulmonary/Chest: Effort normal. She has no wheezes. She has no rales.  Abdominal: Soft. She exhibits no distension. There is no tenderness.  Musculoskeletal: She exhibits tenderness (right knee). She exhibits no edema.  Neurological: She is alert and oriented to person, place, and time.  Skin: Skin is warm and dry.  Psychiatric: She has a normal mood and affect. Her behavior is normal. Thought content normal.  Nursing note and vitals reviewed.   Assessment & Plan:  1: Chronic heart failure with reduced ejection fraction- - NYHA class III - euvolemic - encouraged her to resume weighing daily. Reminded to call  for an overnight weight gain of >2 pounds or a weekly weight gain of >5 pounds - not adding salt to her food and is trying to eat low sodium foods - discussed entresto and she says that she just got a 90 day prescription of her losartan filled and would like to finish that before making the switch.  - advised her to finish out the losartan and not refill it. Will plan on changing her to entresto at her next office visit - saw her cardiologist Nehemiah Massed) 09/03/16 and returns to him June 2018  2: HTN- - BP looks good today - continue medications at this time - saw PCP Doy Hutching) 08/01/16 and returns to him April 2018  3: Diabetes- - glucose today was 168 - follows with PCP about this  4: Anxiety- - admits to being under a lot of family stress with financial issues related to deaths in her family - does take 0.5mg  alprazolam at bedtime on occasion so that she can settle her mind down to sleep - denies any suicidal thoughts  5: Fall- - fell at home after tripping over an elevated piece of carpet. Was not using her cane/walker at the time - no small throw rugs in the home - encouraged her to make sure she lifts up her feet completely and to not walk in a hurry - knee swelling/pain is decreasing per her report  Patient did not bring her medications nor a list. Each medication was verbally reviewed with the patient and she was encouraged to bring the bottles to every visit to confirm accuracy of list.  Return in 3 months or sooner for any questions/problems before next office visit.

## 2016-10-06 NOTE — Patient Instructions (Addendum)
Resume weighing daily and call for an overnight weight gain of > 2 pounds or a weekly weight gain of >5 pounds.  Do not refill any more losartan as we will switch you to entresto at your next visit.

## 2016-10-30 DIAGNOSIS — G894 Chronic pain syndrome: Secondary | ICD-10-CM | POA: Diagnosis not present

## 2016-10-30 DIAGNOSIS — Z794 Long term (current) use of insulin: Secondary | ICD-10-CM | POA: Diagnosis not present

## 2016-10-30 DIAGNOSIS — Z79899 Other long term (current) drug therapy: Secondary | ICD-10-CM | POA: Diagnosis not present

## 2016-10-30 DIAGNOSIS — L989 Disorder of the skin and subcutaneous tissue, unspecified: Secondary | ICD-10-CM | POA: Diagnosis not present

## 2016-10-30 DIAGNOSIS — I10 Essential (primary) hypertension: Secondary | ICD-10-CM | POA: Diagnosis not present

## 2016-10-30 DIAGNOSIS — I5022 Chronic systolic (congestive) heart failure: Secondary | ICD-10-CM | POA: Diagnosis not present

## 2016-10-30 DIAGNOSIS — E782 Mixed hyperlipidemia: Secondary | ICD-10-CM | POA: Diagnosis not present

## 2016-10-30 DIAGNOSIS — F411 Generalized anxiety disorder: Secondary | ICD-10-CM | POA: Diagnosis not present

## 2016-10-30 DIAGNOSIS — E119 Type 2 diabetes mellitus without complications: Secondary | ICD-10-CM | POA: Diagnosis not present

## 2016-11-05 DIAGNOSIS — M1711 Unilateral primary osteoarthritis, right knee: Secondary | ICD-10-CM | POA: Diagnosis not present

## 2016-11-05 DIAGNOSIS — E119 Type 2 diabetes mellitus without complications: Secondary | ICD-10-CM | POA: Diagnosis not present

## 2016-11-05 DIAGNOSIS — Z96651 Presence of right artificial knee joint: Secondary | ICD-10-CM | POA: Diagnosis not present

## 2016-11-05 DIAGNOSIS — Z794 Long term (current) use of insulin: Secondary | ICD-10-CM | POA: Diagnosis not present

## 2016-11-05 DIAGNOSIS — G894 Chronic pain syndrome: Secondary | ICD-10-CM | POA: Diagnosis not present

## 2016-11-11 DIAGNOSIS — I495 Sick sinus syndrome: Secondary | ICD-10-CM | POA: Diagnosis not present

## 2016-11-13 DIAGNOSIS — H43812 Vitreous degeneration, left eye: Secondary | ICD-10-CM | POA: Diagnosis not present

## 2016-11-17 DIAGNOSIS — L821 Other seborrheic keratosis: Secondary | ICD-10-CM | POA: Diagnosis not present

## 2017-01-01 DIAGNOSIS — I495 Sick sinus syndrome: Secondary | ICD-10-CM | POA: Diagnosis not present

## 2017-01-01 DIAGNOSIS — I25810 Atherosclerosis of coronary artery bypass graft(s) without angina pectoris: Secondary | ICD-10-CM | POA: Diagnosis not present

## 2017-01-01 DIAGNOSIS — I5022 Chronic systolic (congestive) heart failure: Secondary | ICD-10-CM | POA: Diagnosis not present

## 2017-01-05 ENCOUNTER — Encounter: Payer: Self-pay | Admitting: Family

## 2017-01-05 ENCOUNTER — Ambulatory Visit: Payer: Medicare HMO | Attending: Family | Admitting: Family

## 2017-01-05 VITALS — BP 138/63 | HR 78 | Resp 20 | Ht 61.0 in | Wt 184.4 lb

## 2017-01-05 DIAGNOSIS — Z880 Allergy status to penicillin: Secondary | ICD-10-CM | POA: Diagnosis not present

## 2017-01-05 DIAGNOSIS — M1990 Unspecified osteoarthritis, unspecified site: Secondary | ICD-10-CM | POA: Insufficient documentation

## 2017-01-05 DIAGNOSIS — I5022 Chronic systolic (congestive) heart failure: Secondary | ICD-10-CM | POA: Diagnosis not present

## 2017-01-05 DIAGNOSIS — I25119 Atherosclerotic heart disease of native coronary artery with unspecified angina pectoris: Secondary | ICD-10-CM | POA: Diagnosis not present

## 2017-01-05 DIAGNOSIS — G8929 Other chronic pain: Secondary | ICD-10-CM | POA: Diagnosis not present

## 2017-01-05 DIAGNOSIS — I130 Hypertensive heart and chronic kidney disease with heart failure and stage 1 through stage 4 chronic kidney disease, or unspecified chronic kidney disease: Secondary | ICD-10-CM | POA: Diagnosis not present

## 2017-01-05 DIAGNOSIS — Z808 Family history of malignant neoplasm of other organs or systems: Secondary | ICD-10-CM | POA: Diagnosis not present

## 2017-01-05 DIAGNOSIS — Z7901 Long term (current) use of anticoagulants: Secondary | ICD-10-CM | POA: Diagnosis not present

## 2017-01-05 DIAGNOSIS — W19XXXD Unspecified fall, subsequent encounter: Secondary | ICD-10-CM

## 2017-01-05 DIAGNOSIS — N189 Chronic kidney disease, unspecified: Secondary | ICD-10-CM | POA: Insufficient documentation

## 2017-01-05 DIAGNOSIS — E785 Hyperlipidemia, unspecified: Secondary | ICD-10-CM | POA: Insufficient documentation

## 2017-01-05 DIAGNOSIS — E1122 Type 2 diabetes mellitus with diabetic chronic kidney disease: Secondary | ICD-10-CM | POA: Diagnosis not present

## 2017-01-05 DIAGNOSIS — Z90710 Acquired absence of both cervix and uterus: Secondary | ICD-10-CM | POA: Diagnosis not present

## 2017-01-05 DIAGNOSIS — R0602 Shortness of breath: Secondary | ICD-10-CM | POA: Insufficient documentation

## 2017-01-05 DIAGNOSIS — I10 Essential (primary) hypertension: Secondary | ICD-10-CM

## 2017-01-05 DIAGNOSIS — F419 Anxiety disorder, unspecified: Secondary | ICD-10-CM | POA: Insufficient documentation

## 2017-01-05 DIAGNOSIS — Z96641 Presence of right artificial hip joint: Secondary | ICD-10-CM | POA: Diagnosis not present

## 2017-01-05 DIAGNOSIS — Z79899 Other long term (current) drug therapy: Secondary | ICD-10-CM | POA: Diagnosis not present

## 2017-01-05 DIAGNOSIS — D649 Anemia, unspecified: Secondary | ICD-10-CM | POA: Diagnosis not present

## 2017-01-05 DIAGNOSIS — Z888 Allergy status to other drugs, medicaments and biological substances status: Secondary | ICD-10-CM | POA: Insufficient documentation

## 2017-01-05 DIAGNOSIS — Z8249 Family history of ischemic heart disease and other diseases of the circulatory system: Secondary | ICD-10-CM | POA: Diagnosis not present

## 2017-01-05 DIAGNOSIS — R05 Cough: Secondary | ICD-10-CM | POA: Diagnosis not present

## 2017-01-05 DIAGNOSIS — Z794 Long term (current) use of insulin: Secondary | ICD-10-CM | POA: Diagnosis not present

## 2017-01-05 DIAGNOSIS — E119 Type 2 diabetes mellitus without complications: Secondary | ICD-10-CM

## 2017-01-05 DIAGNOSIS — IMO0001 Reserved for inherently not codable concepts without codable children: Secondary | ICD-10-CM

## 2017-01-05 DIAGNOSIS — Z9889 Other specified postprocedural states: Secondary | ICD-10-CM | POA: Diagnosis not present

## 2017-01-05 NOTE — Progress Notes (Signed)
Patient ID: Amber [name], female    DOB: August 30, 1929, 83 y.o.   MRN: [id number]  HPI  Amber [name] is a 83 y/o female with a history of hyperlipidemia, HTN, DM, CAD, chronic pain, CKD, arthritis, anxiety, anemia and chronic heart failure.   Last echo was done 07/27/16 and showed an EF of 35-40% along with moderate MR/TR. This is essentially unchanged from a previous echo done August 2017.  Came to the ED on 09/19/16 after a mechanical fall. She tripped over some carpet and fell onto her right knee. Evaluated and discharged home. Last admitted on 07/26/16 with acute heart failure exacerbation and HTN. Was treated and release after 2 days. Was in the ED on 06/06/16 for left wrist pain. Was evaluated and discharged home.   She presents today for a follow-up visit with a chief complaint of mild shortness of breath with moderate exertion. She says that this has been present for years with varying levels of severity. She has associated fatigue and cough along with this.   Past Medical History:  Diagnosis Date  . Anemia   . Anxiety   . Arthritis   . Benign neoplasm of colon   . CHF (congestive heart failure) ([place])   . Chronic kidney disease   . Chronic pain   . Complication of anesthesia 1980's   hard time waking up  . Coronary artery disease with unspecified angina pectoris   . Cough   . Diabetes mellitus without complication ([place])   . Essential hypertension   . High risk medication use   . Hyperlipidemia   . Plantar fascial fibromatosis   . Presence of permanent cardiac pacemaker   . Shortness of breath dyspnea    Past Surgical History:  Procedure Laterality Date  . ABDOMINAL HYSTERECTOMY    . BACK SURGERY  1960's   cage and screws in lower back  . CATARACT EXTRACTION W/ INTRAOCULAR LENS  IMPLANT, BILATERAL Bilateral   . CORONARY ARTERY BYPASS GRAFT  2008  . JOINT REPLACEMENT Right    hip and knee  . ORIF ANKLE FRACTURE Right 07/27/2014   Procedure: OPEN REDUCTION INTERNAL  FIXATION (ORIF) ANKLE FRACTURE;  Surgeon: Alta Corning, MD;  Location: [place];  Service: Orthopedics;  Laterality: Right;  . PACEMAKER INSERTION Left 03/14/2015   Procedure: INSERTION PACEMAKER;  Surgeon: Isaias Cowman, MD;  Location: [facility];  Service: Cardiovascular;  Laterality: Left;  . ROTATOR CUFF REPAIR    . TOTAL HIP ARTHROPLASTY Right   . TOTAL KNEE ARTHROPLASTY Right   . VEIN LIGATION AND STRIPPING     Family History  Problem Relation Age of Onset  . Heart attack Mother   . Heart disease Father   . Alzheimer's disease Sister   . Cervical cancer Sister   . Heart failure Son    Social History  Substance Use Topics  . Smoking status: Never Smoker  . Smokeless tobacco: Never Used  . Alcohol use No   Allergies  Allergen Reactions  . Ambien [Zolpidem Tartrate] Other (See Comments)    Reaction:  Keeps pt awake   . Etodolac Other (See Comments)    Reaction:  Unknown   . Iodine Itching  . Nsaids Other (See Comments)    Reaction:  Unknown   . Penicillins Itching and Other (See Comments)    Has patient had a PCN reaction causing immediate rash, facial/tongue/throat swelling, SOB or lightheadedness with hypotension: No Has patient had a PCN reaction causing severe rash involving  mucus membranes or skin necrosis: No Has patient had a PCN reaction that required hospitalization No Has patient had a PCN reaction occurring within the last 10 years: No If all of the above answers are "NO", then may proceed with Cephalosporin use.  . Succinylcholine Other (See Comments)    Reaction:  Unknown    Prior to Admission medications   Medication Sig Start Date End Date Taking? Authorizing Provider  alendronate (FOSAMAX) 70 MG tablet Take 70 mg by mouth every Thursday.    Yes [provider]  ALPRAZolam Duanne Moron) 1 MG tablet Take 1 tablet (1 mg total) by mouth at bedtime as needed for anxiety. Patient taking differently: Take 0.5 mg by mouth at bedtime as needed for anxiety.   07/31/14  Yes Elgergawy, Silver Huguenin, MD  amLODipine (NORVASC) 10 MG tablet Take 5 mg by mouth daily.    Yes [provider]  apixaban (ELIQUIS) 5 MG TABS tablet Take 5 mg by mouth 2 (two) times daily.   Yes [provider]  calcium carbonate (OSCAL) 1500 (600 Ca) MG TABS tablet Take 600 mg of elemental calcium by mouth daily with breakfast.   Yes [provider]  furosemide (LASIX) 20 MG tablet Take 1 tablet (20 mg total) by mouth daily. 08/07/16 08/07/17 Yes Pasqualino Witherspoon, Otila Kluver A, FNP  gabapentin (NEURONTIN) 100 MG capsule Take 200 mg by mouth 3 (three) times daily.    Yes [provider]  insulin aspart protamine- aspart (NOVOLOG MIX 70/30) (70-30) 100 UNIT/ML injection Inject 10 Units into the skin 2 (two) times daily with a meal.   Yes [provider]  losartan (COZAAR) 100 MG tablet Take 100 mg by mouth daily.   Yes [provider]  Multiple Vitamins-Minerals (PRESERVISION AREDS 2) CAPS Take 1 capsule by mouth daily.   Yes [provider]  omeprazole (PRILOSEC) 20 MG capsule Take 20 mg by mouth daily.   Yes [provider]  pravastatin (PRAVACHOL) 40 MG tablet Take 40 mg by mouth at bedtime.   Yes [provider]  VENTOLIN HFA 108 (90 Base) MCG/ACT inhaler Inhale 2 puffs into the lungs every 6 (six) hours as needed. 06/01/16  Yes [provider]  traMADol (ULTRAM) 50 MG tablet Take 1 tablet (50 mg total) by mouth every 6 (six) hours as needed. 09/19/16   Daymon Larsen, MD   Review of Systems  Constitutional: Positive for fatigue. Negative for appetite change.  HENT: Negative for congestion, postnasal drip and sore throat.   Eyes: Negative.   Respiratory: Positive for cough and shortness of breath. Negative for chest tightness.   Cardiovascular: Negative for chest pain, palpitations and leg swelling.  Gastrointestinal: Negative for abdominal distention and abdominal pain.  Endocrine: Negative.   Genitourinary:  Negative.   Musculoskeletal: Positive for arthralgias (right knee pain & left hip pain). Negative for neck pain.  Skin:       Knot on the back of her head   Allergic/Immunologic: Negative.   Neurological: Negative for dizziness, light-headedness and numbness.  Hematological: Negative for adenopathy. Bruises/bleeds easily.  Psychiatric/Behavioral: Negative for dysphoric mood and sleep disturbance. The patient is nervous/anxious.    Vitals:   01/05/17 1020  BP: 138/63  Pulse: 78  Resp: 20  SpO2: 98%  Weight: 184 lb 6 oz (83.6 kg)  Height: 5\' 1"  (1.549 m)   Wt Readings from Last 3 Encounters:  01/05/17 184 lb 6 oz (83.6 kg)  10/06/16 184 lb 8 oz (83.7 kg)  09/19/16 177 lb (80.3 kg)    Lab Results  Component Value Date   CREATININE 0.97 07/28/2016   CREATININE 0.90 07/27/2016   CREATININE 0.81 07/27/2016    Physical Exam  Constitutional: She is oriented to person, place, and time. She appears well-developed and well-nourished.  HENT:  Head: Normocephalic and atraumatic.  Neck: Normal range of motion. Neck supple. No JVD present.  Cardiovascular: Normal rate and regular rhythm.   Pulmonary/Chest: Effort normal. She has no wheezes. She has no rales.  Abdominal: Soft. She exhibits no distension. There is no tenderness.  Musculoskeletal: She exhibits tenderness (back of skull). She exhibits no edema.  Neurological: She is alert and oriented to person, place, and time.  Skin: Skin is warm and dry.  Psychiatric: She has a normal mood and affect. Her behavior is normal. Thought content normal.  Nursing note and vitals reviewed.   Assessment & Plan:  1: Chronic heart failure with reduced ejection fraction- - NYHA class II - euvolemic - continue weighing daily. Reminded to call for an overnight weight gain of >2 pounds or a weekly weight gain of >5 pounds - not adding salt to her food and is trying to eat low sodium foods - has about 1 week left of losartan. She was  instructed to finish it out and then begin entresto 24/26mg  twice daily,. Cardiologist has already given RX for entresto - saw her cardiologist Amber [name]) 01/01/17 and returns July 2018 - will check BMP at her next visit if not done at cardiologist's office  2: HTN- - BP looks good today - continue medications at this time - saw PCP Amber [name]) 10/30/16  3: Diabetes- - glucose today was 212 - she adjusts her insulin herself - follows with PCP about this  4: Anxiety- - admits to being under a lot of family stress with financial issues related to deaths in her family - does take 0.5mg  alprazolam at bedtime on occasion so that she can settle her mind down to sleep - denies any suicidal thoughts  5: Fall- - fell again a couple of days ago and has no idea what happened leading up to fall - did not present to ED or PCP's office - has a knot on the back of her head which she reports as being slightly tender but improving - advised her to call her PCP's office to let him know that she's fallen  Patient did not bring her medications nor a list. Each medication was verbally reviewed with the patient and she was encouraged to bring the bottles to every visit to confirm accuracy of list.  Return in 2 months or sooner for any questions/problems before next office visit.

## 2017-01-05 NOTE — Patient Instructions (Addendum)
Continue weighing daily and call for an overnight weight gain of > 2 pounds or a weekly weight gain of >5 pounds.  Finish out the losartan. Once you finish the losartan, then you will begin entresto 24/26mg  twice daily

## 2017-01-06 ENCOUNTER — Ambulatory Visit
Admission: RE | Admit: 2017-01-06 | Discharge: 2017-01-06 | Disposition: A | Discharge status: Home / Self Care | Payer: Medicare HMO | Source: Ambulatory Visit | Attending: Family Medicine | Admitting: Family Medicine

## 2017-01-06 ENCOUNTER — Other Ambulatory Visit: Payer: Self-pay | Admitting: Family Medicine

## 2017-01-06 DIAGNOSIS — R55 Syncope and collapse: Secondary | ICD-10-CM | POA: Diagnosis not present

## 2017-01-06 DIAGNOSIS — I6523 Occlusion and stenosis of bilateral carotid arteries: Secondary | ICD-10-CM | POA: Insufficient documentation

## 2017-01-06 DIAGNOSIS — T671XXD Heat syncope, subsequent encounter: Secondary | ICD-10-CM | POA: Diagnosis not present

## 2017-01-18 ENCOUNTER — Emergency Department: Payer: Medicare HMO

## 2017-01-18 ENCOUNTER — Emergency Department
Admission: EM | Admit: 2017-01-18 | Discharge: 2017-01-18 | Disposition: A | Discharge status: Home / Self Care | Payer: Medicare HMO | Attending: Student in an Organized Health Care Education/Training Program | Admitting: Student in an Organized Health Care Education/Training Program

## 2017-01-18 ENCOUNTER — Encounter: Payer: Self-pay | Admitting: Emergency Medicine

## 2017-01-18 DIAGNOSIS — Z7901 Long term (current) use of anticoagulants: Secondary | ICD-10-CM | POA: Diagnosis not present

## 2017-01-18 DIAGNOSIS — S99911A Unspecified injury of right ankle, initial encounter: Secondary | ICD-10-CM | POA: Diagnosis present

## 2017-01-18 DIAGNOSIS — Z951 Presence of aortocoronary bypass graft: Secondary | ICD-10-CM | POA: Insufficient documentation

## 2017-01-18 DIAGNOSIS — Y9389 Activity, other specified: Secondary | ICD-10-CM | POA: Diagnosis not present

## 2017-01-18 DIAGNOSIS — N189 Chronic kidney disease, unspecified: Secondary | ICD-10-CM | POA: Insufficient documentation

## 2017-01-18 DIAGNOSIS — I130 Hypertensive heart and chronic kidney disease with heart failure and stage 1 through stage 4 chronic kidney disease, or unspecified chronic kidney disease: Secondary | ICD-10-CM | POA: Diagnosis not present

## 2017-01-18 DIAGNOSIS — Z96641 Presence of right artificial hip joint: Secondary | ICD-10-CM | POA: Diagnosis not present

## 2017-01-18 DIAGNOSIS — Z79899 Other long term (current) drug therapy: Secondary | ICD-10-CM | POA: Diagnosis not present

## 2017-01-18 DIAGNOSIS — I2510 Atherosclerotic heart disease of native coronary artery without angina pectoris: Secondary | ICD-10-CM | POA: Diagnosis not present

## 2017-01-18 DIAGNOSIS — Y999 Unspecified external cause status: Secondary | ICD-10-CM | POA: Insufficient documentation

## 2017-01-18 DIAGNOSIS — M79671 Pain in right foot: Secondary | ICD-10-CM | POA: Diagnosis not present

## 2017-01-18 DIAGNOSIS — I509 Heart failure, unspecified: Secondary | ICD-10-CM | POA: Diagnosis not present

## 2017-01-18 DIAGNOSIS — W19XXXA Unspecified fall, initial encounter: Secondary | ICD-10-CM | POA: Diagnosis not present

## 2017-01-18 DIAGNOSIS — S0990XA Unspecified injury of head, initial encounter: Secondary | ICD-10-CM | POA: Diagnosis not present

## 2017-01-18 DIAGNOSIS — E1122 Type 2 diabetes mellitus with diabetic chronic kidney disease: Secondary | ICD-10-CM | POA: Insufficient documentation

## 2017-01-18 DIAGNOSIS — S99922A Unspecified injury of left foot, initial encounter: Secondary | ICD-10-CM | POA: Diagnosis not present

## 2017-01-18 DIAGNOSIS — S92811A Other fracture of right foot, initial encounter for closed fracture: Secondary | ICD-10-CM | POA: Diagnosis not present

## 2017-01-18 DIAGNOSIS — M25571 Pain in right ankle and joints of right foot: Secondary | ICD-10-CM | POA: Diagnosis not present

## 2017-01-18 DIAGNOSIS — Z7902 Long term (current) use of antithrombotics/antiplatelets: Secondary | ICD-10-CM | POA: Diagnosis not present

## 2017-01-18 DIAGNOSIS — Z794 Long term (current) use of insulin: Secondary | ICD-10-CM | POA: Insufficient documentation

## 2017-01-18 DIAGNOSIS — R52 Pain, unspecified: Secondary | ICD-10-CM

## 2017-01-18 DIAGNOSIS — Z96651 Presence of right artificial knee joint: Secondary | ICD-10-CM | POA: Insufficient documentation

## 2017-01-18 DIAGNOSIS — Y929 Unspecified place or not applicable: Secondary | ICD-10-CM | POA: Diagnosis not present

## 2017-01-18 DIAGNOSIS — R55 Syncope and collapse: Secondary | ICD-10-CM

## 2017-01-18 LAB — URINALYSIS, COMPLETE (UACMP) WITH MICROSCOPIC
BACTERIA UA: NONE SEEN
BILIRUBIN URINE: NEGATIVE
Glucose, UA: 50 mg/dL — AB
HGB URINE DIPSTICK: NEGATIVE
Ketones, ur: 5 mg/dL — AB
LEUKOCYTES UA: NEGATIVE
Nitrite: NEGATIVE
PROTEIN: 30 mg/dL — AB
SPECIFIC GRAVITY, URINE: 1.03 (ref 1.005–1.030)
pH: 5 (ref 5.0–8.0)

## 2017-01-18 LAB — CBC
HEMATOCRIT: 36.2 % (ref 35.0–47.0)
Hemoglobin: 11.9 g/dL — ABNORMAL LOW (ref 12.0–16.0)
MCH: 28.3 pg (ref 26.0–34.0)
MCHC: 33 g/dL (ref 32.0–36.0)
MCV: 85.7 fL (ref 80.0–100.0)
PLATELETS: 322 10*3/uL (ref 150–440)
RBC: 4.22 MIL/uL (ref 3.80–5.20)
RDW: 17.6 % — ABNORMAL HIGH (ref 11.5–14.5)
WBC: 6.5 10*3/uL (ref 3.6–11.0)

## 2017-01-18 LAB — BASIC METABOLIC PANEL
Anion gap: 11 (ref 5–15)
BUN: 19 mg/dL (ref 6–20)
CHLORIDE: 96 mmol/L — AB (ref 101–111)
CO2: 28 mmol/L (ref 22–32)
CREATININE: 1.02 mg/dL — AB (ref 0.44–1.00)
Calcium: 9.5 mg/dL (ref 8.9–10.3)
GFR calc non Af Amer: 49 mL/min — ABNORMAL LOW (ref >60)
GFR, EST AFRICAN AMERICAN: 57 mL/min — AB (ref >60)
Glucose, Bld: 324 mg/dL — ABNORMAL HIGH (ref 65–99)
POTASSIUM: 4.1 mmol/L (ref 3.5–5.1)
Sodium: 135 mmol/L (ref 135–145)

## 2017-01-18 LAB — TROPONIN I

## 2017-01-18 MED ORDER — TRAMADOL HCL 50 MG PO TABS: 50.0000 mg | ORAL_TABLET | Freq: Four times a day (QID) | ORAL | 0 refills | Status: DC | PRN

## 2017-01-18 MED ORDER — TRAMADOL HCL 50 MG PO TABS
50.0000 mg | ORAL_TABLET | Freq: Once | ORAL | Status: AC
  Administered 2017-01-18: 50 mg via ORAL
  Filled 2017-01-18: qty 1

## 2017-01-18 NOTE — ED Triage Notes (Signed)
Pt had syncopal episode to day and and has had three in the past months. Pt is on eliquis and does not feel like she hit her head but did have LOC with fall. Pt is alert and oriented and in NAD at this time in triage.

## 2017-01-18 NOTE — ED Provider Notes (Signed)
[facility] Emergency Department Provider Note    First MD Initiated Contact with Patient 01/18/17 2202     (approximate)  I have reviewed the triage vital signs and the nursing notes.   HISTORY  Chief Complaint Loss of Consciousness    HPI Amber [name] is a 83 y.o. female presents after a fainting spell today with right ankle injury. Patient with history of congestive heart failure status post pacemaker placement with frequent and recurrent syncopal episodes. Patient seems to relate that many of these syncopal or fainting spells occur during coughing fits. States that she'll start coughing to the point where she passes out for a few seconds. Had a similar episode happened today. She denies any chest pain or pressure prior to the episode. Denies any numbness or tingling. There is no post ictal period. She is on Eliquis for her congestive heart failure but denies any melena or hematochezia.Family members have witnessed this multiple times before. She currently feels well. Episode happened several hours prior to arrival. Her on complaint at this time is pain to the right ankle and foot.   Echo 07/27/16 Study Conclusions  - Left ventricle: The cavity size was moderately dilated. Wall   thickness was normal. Systolic function was moderately reduced.   The estimated ejection fraction was in the range of 35% to 40%.   Diffuse hypokinesis. Regional wall motion abnormalities cannot be   excluded. - Aortic valve: Valve area (VTI): 1.5 cm^2. Valve area (Vmax): 1.65   cm^2. Valve area (Vmean): 1.55 cm^2. - Mitral valve: There was moderate regurgitation. - Left atrium: The atrium was mildly dilated. - Right ventricle: The cavity size was mildly dilated. Wall   thickness was normal. - Tricuspid valve: There was moderate regurgitation.  Impressions:  - Moderate to severely reduced overall left ventricular function   globally depressed   ejection fraction  between 35 and 40%   moderately dilated RV and LV   moderate MR and TR.  Carotid US 01/06/17 RIGHT CAROTID ARTERY: No significant calcifications of the right common carotid artery. Intermediate waveform maintained. Heterogeneous and partially calcified plaque at the right carotid bifurcation. No significant lumen shadowing. Low resistance waveform of the right ICA. No significant tortuosity.  RIGHT VERTEBRAL ARTERY: Antegrade flow with low resistance waveform.  LEFT CAROTID ARTERY: No significant calcifications of the left common carotid artery. Intermediate waveform maintained. Heterogeneous and partially calcified plaque at the left carotid bifurcation without significant lumen shadowing. Low resistance waveform of the left ICA. No significant tortuosity.  LEFT VERTEBRAL ARTERY:  Antegrade flow with low resistance waveform.  IMPRESSION: Color duplex indicates minimal heterogeneous and calcified plaque, with no hemodynamically significant stenosis by duplex criteria in the extracranial cerebrovascular circulation.    Past Medical History:  Diagnosis Date  . Anemia   . Anxiety   . Arthritis   . Benign neoplasm of colon   . CHF (congestive heart failure) ([place])   . Chronic kidney disease   . Chronic pain   . Complication of anesthesia 1980's   hard time waking up  . Coronary artery disease with unspecified angina pectoris   . Cough   . Diabetes mellitus without complication ([place])   . Essential hypertension   . High risk medication use   . Hyperlipidemia   . Plantar fascial fibromatosis   . Presence of permanent cardiac pacemaker   . Shortness of breath dyspnea    Family History  Problem Relation Age of Onset  . Heart  attack Mother   . Heart disease Father   . Alzheimer's disease Sister   . Cervical cancer Sister   . Heart failure Son    Past Surgical History:  Procedure Laterality Date  . ABDOMINAL HYSTERECTOMY    . BACK SURGERY  1960's   cage and screws  in lower back  . CATARACT EXTRACTION W/ INTRAOCULAR LENS  IMPLANT, BILATERAL Bilateral   . CORONARY ARTERY BYPASS GRAFT  2008  . JOINT REPLACEMENT Right    hip and knee  . ORIF ANKLE FRACTURE Right 07/27/2014   Procedure: OPEN REDUCTION INTERNAL FIXATION (ORIF) ANKLE FRACTURE;  Surgeon: Alta Corning, MD;  Location: [facility];  Service: Orthopedics;  Laterality: Right;  . PACEMAKER INSERTION Left 03/14/2015   Procedure: INSERTION PACEMAKER;  Surgeon: Isaias Cowman, MD;  Location: [facility];  Service: Cardiovascular;  Laterality: Left;  . ROTATOR CUFF REPAIR    . TOTAL HIP ARTHROPLASTY Right   . TOTAL KNEE ARTHROPLASTY Right   . VEIN LIGATION AND STRIPPING     Patient Active Problem List   Diagnosis Date Noted  . Fall 10/06/2016  . Anxiety 08/08/2016  . TIA (transient ischemic attack) 07/25/2015  . Sick sinus syndrome ([place]) 03/14/2015  . Bradycardia 02/16/2015  . Chronic heart failure ([place]) 01/05/2015  . Fracture dislocation of ankle 07/27/2014  . IDDM (insulin dependent diabetes mellitus) ([place]) 07/27/2014  . Essential hypertension 07/27/2014  . Multiple rib fractures 07/27/2014  . 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 Authorizing Provider  alendronate (FOSAMAX) 70 MG tablet Take 70 mg by mouth every Thursday.     [provider]  ALPRAZolam Duanne Moron) 1 MG tablet Take 1 tablet (1 mg total) by mouth at bedtime as needed for anxiety. Patient taking differently: Take 0.5 mg by mouth at bedtime as needed for anxiety.  07/31/14   Elgergawy, Silver Huguenin, MD  amLODipine (NORVASC) 10 MG tablet Take 5 mg by mouth daily.     [provider]  apixaban (ELIQUIS) 5 MG TABS tablet Take 5 mg by mouth 2 (two) times daily.    [provider]  calcium carbonate (OSCAL) 1500 (600 Ca) MG TABS tablet Take 600 mg of elemental calcium by mouth daily with breakfast.    [provider]  furosemide  (LASIX) 20 MG tablet Take 1 tablet (20 mg total) by mouth daily. 08/07/16 08/07/17  Alisa Graff, FNP  gabapentin (NEURONTIN) 100 MG capsule Take 200 mg by mouth 3 (three) times daily.     [provider]  insulin aspart protamine- aspart (NOVOLOG MIX 70/30) (70-30) 100 UNIT/ML injection Inject 10 Units into the skin 2 (two) times daily with a meal.    [provider]  losartan (COZAAR) 100 MG tablet Take 100 mg by mouth daily.    [provider]  Multiple Vitamins-Minerals (PRESERVISION AREDS 2) CAPS Take 1 capsule by mouth daily.    [provider]  omeprazole (PRILOSEC) 20 MG capsule Take 20 mg by mouth daily.    [provider]  pravastatin (PRAVACHOL) 40 MG tablet Take 40 mg by mouth at bedtime.    [provider]  traMADol (ULTRAM) 50 MG tablet Take 1 tablet (50 mg total) by mouth every 6 (six) hours as needed. 01/18/17   Merlyn Lot, MD  VENTOLIN HFA 108 [id number] Base) MCG/ACT inhaler Inhale 2 puffs into the lungs every 6 (six) hours as needed.  06/01/16   [provider]    Allergies Ambien [zolpidem tartrate]; Etodolac; Iodine; Nsaids; Penicillins; and Succinylcholine    Social History Social History  Substance Use Topics  . Smoking status: Never Smoker  . Smokeless tobacco: Never Used  . Alcohol use No    Review of Systems Patient denies headaches, rhinorrhea, blurry vision, numbness, shortness of breath, chest pain, edema, cough, abdominal pain, nausea, vomiting, diarrhea, dysuria, fevers, rashes or hallucinations unless otherwise stated above in HPI. ____________________________________________   PHYSICAL EXAM:  VITAL SIGNS: Vitals:   01/18/17 1855 01/18/17 2216  BP: (!) 148/69 (!) 168/75  Pulse: 62 65  Resp:  19  Temp: 98.6 F (37 C)     Constitutional: Alert and oriented. Well appearing and in no acute distress. Eyes: Conjunctivae are normal.  Head: Atraumatic. Nose: No  congestion/rhinnorhea. Mouth/Throat: Mucous membranes are moist.   Neck: No stridor. Painless ROM.  Cardiovascular: Normal rate, regular rhythm. Grossly normal heart sounds.  Good peripheral circulation. Respiratory: Normal respiratory effort.  No retractions. Lungs CTAB. Gastrointestinal: Soft and nontender. No distention. No abdominal bruits. No CVA tenderness. Musculoskeletal: ttp of right base of 1st metatarsal and lateral malleolus, no instability.  No knee ttp Neurologic:  Normal speech and language. No gross focal neurologic deficits are appreciated. No facial droop Skin:  Skin is warm, dry and intact. No rash noted. Psychiatric: Mood and affect are normal. Speech and behavior are normal.  ____________________________________________   LABS (all labs ordered are listed, but only abnormal results are displayed)  Results for orders placed or performed during the hospital encounter of 01/18/17 (from the past 24 hour(s))  Basic metabolic panel     Status: Abnormal   Collection Time: 01/18/17  7:08 PM  Result Value Ref Range   Sodium 135 135 - 145 mmol/L   Potassium 4.1 3.5 - 5.1 mmol/L   Chloride 96 (L) 101 - 111 mmol/L   CO2 28 22 - 32 mmol/L   Glucose, Bld 324 (H) 65 - 99 mg/dL   BUN 19 6 - 20 mg/dL   Creatinine, Ser 1.02 (H) 0.44 - 1.00 mg/dL   Calcium 9.5 8.9 - 10.3 mg/dL   GFR calc non Af Amer 49 (L) >60 mL/min   GFR calc Af Amer 57 (L) >60 mL/min   Anion gap 11 5 - 15  CBC     Status: Abnormal   Collection Time: 01/18/17  7:08 PM  Result Value Ref Range   WBC 6.5 3.6 - 11.0 K/uL   RBC 4.22 3.80 - 5.20 MIL/uL   Hemoglobin 11.9 (L) 12.0 - 16.0 g/dL   HCT 36.2 35.0 - 47.0 %   MCV 85.7 80.0 - 100.0 fL   MCH 28.3 26.0 - 34.0 pg   MCHC 33.0 32.0 - 36.0 g/dL   RDW 17.6 (H) 11.5 - 14.5 %   Platelets 322 150 - 440 K/uL  Urinalysis, Complete w Microscopic     Status: Abnormal   Collection Time: 01/18/17  7:08 PM  Result Value Ref Range   Color, Urine AMBER (A) YELLOW    APPearance CLEAR (A) CLEAR   Specific Gravity, Urine 1.030 1.005 - 1.030   pH 5.0 5.0 - 8.0   Glucose, UA 50 (A) NEGATIVE mg/dL   Hgb urine dipstick NEGATIVE NEGATIVE   Bilirubin Urine NEGATIVE NEGATIVE   Ketones, ur 5 (A) NEGATIVE mg/dL   Protein, ur 30 (A) NEGATIVE mg/dL   Nitrite NEGATIVE NEGATIVE   Leukocytes, UA NEGATIVE NEGATIVE  RBC / HPF 0-5 0 - 5 RBC/hpf   WBC, UA 0-5 0 - 5 WBC/hpf   Bacteria, UA NONE SEEN NONE SEEN   Squamous Epithelial / LPF 0-5 (A) NONE SEEN   Mucous PRESENT    Hyaline Casts, UA PRESENT   Troponin I     Status: None   Collection Time: 01/18/17  7:08 PM  Result Value Ref Range   Troponin I <0.03 <0.03 ng/mL   ____________________________________________  EKG My review and personal interpretation at Time: 19:06   Indication: syncope  Rate: 70  Rhythm: v paced Axis: left Other: no sgarbossa criteria ____________________________________________  RADIOLOGY  I personally reviewed all radiographic images ordered to evaluate for the above acute complaints and reviewed radiology reports and findings.  These findings were personally discussed with the patient.  Please see medical record for radiology report.  ____________________________________________   PROCEDURES  Procedure(s) performed:  Procedures    Critical Care performed: no ____________________________________________   INITIAL IMPRESSION / ASSESSMENT AND PLAN / ED COURSE  Pertinent labs & imaging results that were available during my care of the patient were reviewed by me and considered in my medical decision making (see chart for details).  DDX: dehydration, dysrhythmia, acs, orthostasis, neurogenic syncope, fracture, Fannie Knee  Tucker Steedley is a 83 y.o. who presents to the ED with fainting spell and right foot pain as described above. Patient arrives here dynamically stable and in no acute distress. She is pleasant and interactive. CT imaging ordered to evaluate for traumatic  injury shows none. Radiographs do show evidence of probable not as well as fracture of the right sesamoid. Does have some swelling over the right lateral malleolus but it's anteriorly and does not have bony tenderness. Do suspect some component of sprain. Chest x-ray shows cardiomegaly but no consolidation, pneumothorax or significant edema. She is satting well on room air. Her EKG has no changes in morphology from previous and is a normal paced rhythm. Her troponin is negative. She denies any melena or hematochezia and her hemoglobin is stable. No neuro deficits or history described or suggest seizure-like activity. Patient does seem to endorse some component of coughing that induces these fainting spells. Cough may be secondary to underlying congestive heart failure. Also question if she has some underlying bronchitis. At this point do not feel the patient requires admission to hospital as much of her syncope workup is ordered and done as an outpatient. Do feel patient will be stable and appropriate for further workup as an outpatient.  Have discussed with the patient and available family all diagnostics and treatments performed thus far and all questions were answered to the best of my ability. The patient demonstrates understanding and agreement with plan.       ____________________________________________   FINAL CLINICAL IMPRESSION(S) / ED DIAGNOSES  Final diagnoses:  Closed fracture of sesamoid bone of right foot, initial encounter  Fainting spell      NEW MEDICATIONS STARTED DURING THIS VISIT:  Current Discharge Medication List       Note:  This document was prepared using Dragon voice recognition software and may include unintentional dictation errors.    Merlyn Lot, MD 01/18/17 2329

## 2017-01-18 NOTE — ED Triage Notes (Addendum)
FIRST NURSE NOTE-pt had syncope this AM. Here for ankle pain from syncope. Alert and oriented at this time

## 2017-01-26 ENCOUNTER — Other Ambulatory Visit: Payer: Self-pay

## 2017-01-26 ENCOUNTER — Emergency Department: Payer: Medicare HMO

## 2017-01-26 ENCOUNTER — Inpatient Hospital Stay
Admission: EM | Admit: 2017-01-26 | Discharge: 2017-01-28 | DRG: 202 | Disposition: A | Discharge status: Home / Self Care | Payer: Medicare HMO | Attending: Internal Medicine | Admitting: Internal Medicine

## 2017-01-26 DIAGNOSIS — Z66 Do not resuscitate: Secondary | ICD-10-CM | POA: Diagnosis present

## 2017-01-26 DIAGNOSIS — E1122 Type 2 diabetes mellitus with diabetic chronic kidney disease: Secondary | ICD-10-CM | POA: Diagnosis present

## 2017-01-26 DIAGNOSIS — Z8673 Personal history of transient ischemic attack (TIA), and cerebral infarction without residual deficits: Secondary | ICD-10-CM

## 2017-01-26 DIAGNOSIS — N182 Chronic kidney disease, stage 2 (mild): Secondary | ICD-10-CM | POA: Diagnosis present

## 2017-01-26 DIAGNOSIS — Z794 Long term (current) use of insulin: Secondary | ICD-10-CM

## 2017-01-26 DIAGNOSIS — I25119 Atherosclerotic heart disease of native coronary artery with unspecified angina pectoris: Secondary | ICD-10-CM | POA: Diagnosis present

## 2017-01-26 DIAGNOSIS — Z79899 Other long term (current) drug therapy: Secondary | ICD-10-CM

## 2017-01-26 DIAGNOSIS — Z950 Presence of cardiac pacemaker: Secondary | ICD-10-CM | POA: Diagnosis not present

## 2017-01-26 DIAGNOSIS — I4891 Unspecified atrial fibrillation: Secondary | ICD-10-CM | POA: Diagnosis present

## 2017-01-26 DIAGNOSIS — G8929 Other chronic pain: Secondary | ICD-10-CM | POA: Diagnosis present

## 2017-01-26 DIAGNOSIS — I5022 Chronic systolic (congestive) heart failure: Secondary | ICD-10-CM | POA: Diagnosis not present

## 2017-01-26 DIAGNOSIS — Z90710 Acquired absence of both cervix and uterus: Secondary | ICD-10-CM

## 2017-01-26 DIAGNOSIS — R55 Syncope and collapse: Secondary | ICD-10-CM | POA: Diagnosis not present

## 2017-01-26 DIAGNOSIS — E1140 Type 2 diabetes mellitus with diabetic neuropathy, unspecified: Secondary | ICD-10-CM | POA: Diagnosis present

## 2017-01-26 DIAGNOSIS — R7989 Other specified abnormal findings of blood chemistry: Secondary | ICD-10-CM | POA: Diagnosis not present

## 2017-01-26 DIAGNOSIS — I442 Atrioventricular block, complete: Secondary | ICD-10-CM | POA: Diagnosis not present

## 2017-01-26 DIAGNOSIS — I429 Cardiomyopathy, unspecified: Secondary | ICD-10-CM | POA: Diagnosis present

## 2017-01-26 DIAGNOSIS — J45998 Other asthma: Principal | ICD-10-CM | POA: Diagnosis present

## 2017-01-26 DIAGNOSIS — I130 Hypertensive heart and chronic kidney disease with heart failure and stage 1 through stage 4 chronic kidney disease, or unspecified chronic kidney disease: Secondary | ICD-10-CM | POA: Diagnosis present

## 2017-01-26 DIAGNOSIS — E785 Hyperlipidemia, unspecified: Secondary | ICD-10-CM | POA: Diagnosis present

## 2017-01-26 DIAGNOSIS — Z96651 Presence of right artificial knee joint: Secondary | ICD-10-CM | POA: Diagnosis present

## 2017-01-26 DIAGNOSIS — R0602 Shortness of breath: Secondary | ICD-10-CM

## 2017-01-26 DIAGNOSIS — E1165 Type 2 diabetes mellitus with hyperglycemia: Secondary | ICD-10-CM | POA: Diagnosis present

## 2017-01-26 DIAGNOSIS — Z951 Presence of aortocoronary bypass graft: Secondary | ICD-10-CM

## 2017-01-26 DIAGNOSIS — Z8249 Family history of ischemic heart disease and other diseases of the circulatory system: Secondary | ICD-10-CM | POA: Diagnosis not present

## 2017-01-26 DIAGNOSIS — Z888 Allergy status to other drugs, medicaments and biological substances status: Secondary | ICD-10-CM

## 2017-01-26 DIAGNOSIS — Z7901 Long term (current) use of anticoagulants: Secondary | ICD-10-CM

## 2017-01-26 DIAGNOSIS — Z961 Presence of intraocular lens: Secondary | ICD-10-CM | POA: Diagnosis present

## 2017-01-26 DIAGNOSIS — Z880 Allergy status to penicillin: Secondary | ICD-10-CM

## 2017-01-26 DIAGNOSIS — K219 Gastro-esophageal reflux disease without esophagitis: Secondary | ICD-10-CM | POA: Diagnosis present

## 2017-01-26 DIAGNOSIS — Z96641 Presence of right artificial hip joint: Secondary | ICD-10-CM | POA: Diagnosis present

## 2017-01-26 DIAGNOSIS — Z981 Arthrodesis status: Secondary | ICD-10-CM

## 2017-01-26 DIAGNOSIS — Z886 Allergy status to analgesic agent status: Secondary | ICD-10-CM

## 2017-01-26 DIAGNOSIS — Z79891 Long term (current) use of opiate analgesic: Secondary | ICD-10-CM

## 2017-01-26 DIAGNOSIS — I509 Heart failure, unspecified: Secondary | ICD-10-CM | POA: Diagnosis not present

## 2017-01-26 DIAGNOSIS — R05 Cough: Secondary | ICD-10-CM | POA: Diagnosis not present

## 2017-01-26 DIAGNOSIS — F419 Anxiety disorder, unspecified: Secondary | ICD-10-CM | POA: Diagnosis present

## 2017-01-26 DIAGNOSIS — J45909 Unspecified asthma, uncomplicated: Secondary | ICD-10-CM | POA: Diagnosis present

## 2017-01-26 LAB — BASIC METABOLIC PANEL
ANION GAP: 12 (ref 5–15)
BUN: 20 mg/dL (ref 6–20)
CHLORIDE: 94 mmol/L — AB (ref 101–111)
CO2: 28 mmol/L (ref 22–32)
Calcium: 9.1 mg/dL (ref 8.9–10.3)
Creatinine, Ser: 0.96 mg/dL (ref 0.44–1.00)
GFR calc Af Amer: >60 mL/min (ref >60)
GFR, EST NON AFRICAN AMERICAN: 53 mL/min — AB (ref >60)
GLUCOSE: 309 mg/dL — AB (ref 65–99)
POTASSIUM: 4.2 mmol/L (ref 3.5–5.1)
Sodium: 134 mmol/L — ABNORMAL LOW (ref 135–145)

## 2017-01-26 LAB — URINALYSIS, COMPLETE (UACMP) WITH MICROSCOPIC
BACTERIA UA: NONE SEEN
BILIRUBIN URINE: NEGATIVE
Glucose, UA: >=500 mg/dL — AB
KETONES UR: NEGATIVE mg/dL
LEUKOCYTES UA: NEGATIVE
NITRITE: NEGATIVE
Protein, ur: 30 mg/dL — AB
Specific Gravity, Urine: 1.016 (ref 1.005–1.030)
pH: 5 (ref 5.0–8.0)

## 2017-01-26 LAB — CBC
HEMATOCRIT: 35.8 % (ref 35.0–47.0)
HEMOGLOBIN: 11.6 g/dL — AB (ref 12.0–16.0)
MCH: 28 pg (ref 26.0–34.0)
MCHC: 32.4 g/dL (ref 32.0–36.0)
MCV: 86.3 fL (ref 80.0–100.0)
Platelets: 287 10*3/uL (ref 150–440)
RBC: 4.15 MIL/uL (ref 3.80–5.20)
RDW: 17.9 % — ABNORMAL HIGH (ref 11.5–14.5)
WBC: 9.1 10*3/uL (ref 3.6–11.0)

## 2017-01-26 LAB — TROPONIN I

## 2017-01-26 LAB — BRAIN NATRIURETIC PEPTIDE: B NATRIURETIC PEPTIDE 5: 787 pg/mL — AB (ref 0.0–100.0)

## 2017-01-26 MED ORDER — AMLODIPINE BESYLATE 5 MG PO TABS
5.0000 mg | ORAL_TABLET | Freq: Every day | ORAL | Status: DC
  Administered 2017-01-27 – 2017-01-28 (×2): 5 mg via ORAL
  Filled 2017-01-26 (×2): qty 1

## 2017-01-26 MED ORDER — ALPRAZOLAM 0.5 MG PO TABS
0.5000 mg | ORAL_TABLET | Freq: Every evening | ORAL | Status: DC | PRN
  Administered 2017-01-27 (×2): 0.5 mg via ORAL
  Filled 2017-01-26 (×2): qty 1

## 2017-01-26 MED ORDER — FUROSEMIDE 10 MG/ML IJ SOLN
40.0000 mg | Freq: Two times a day (BID) | INTRAMUSCULAR | Status: DC
  Administered 2017-01-27 – 2017-01-28 (×3): 40 mg via INTRAVENOUS
  Filled 2017-01-26 (×3): qty 4

## 2017-01-26 MED ORDER — BENZONATATE 100 MG PO CAPS
200.0000 mg | ORAL_CAPSULE | Freq: Three times a day (TID) | ORAL | Status: DC | PRN
Start: 2017-01-26 — End: 2017-01-28
  Administered 2017-01-27: 200 mg via ORAL
  Filled 2017-01-26: qty 2

## 2017-01-26 MED ORDER — INSULIN ASPART PROT & ASPART (70-30 MIX) 100 UNIT/ML ~~LOC~~ SUSP
15.0000 [IU] | Freq: Two times a day (BID) | SUBCUTANEOUS | Status: DC
  Administered 2017-01-27 – 2017-01-28 (×3): 15 [IU] via SUBCUTANEOUS
  Filled 2017-01-26 (×3): qty 10

## 2017-01-26 MED ORDER — AZITHROMYCIN 250 MG PO TABS
250.0000 mg | ORAL_TABLET | Freq: Every day | ORAL | Status: DC
  Administered 2017-01-27 – 2017-01-28 (×2): 250 mg via ORAL
  Filled 2017-01-26 (×2): qty 1

## 2017-01-26 MED ORDER — IPRATROPIUM-ALBUTEROL 0.5-2.5 (3) MG/3ML IN SOLN
3.0000 mL | Freq: Once | RESPIRATORY_TRACT | Status: AC
  Administered 2017-01-26: 3 mL via RESPIRATORY_TRACT
  Filled 2017-01-26: qty 3

## 2017-01-26 MED ORDER — OCUVITE-LUTEIN PO CAPS
1.0000 | ORAL_CAPSULE | Freq: Every day | ORAL | Status: DC
  Administered 2017-01-27 – 2017-01-28 (×2): 1 via ORAL
  Filled 2017-01-26 (×2): qty 1

## 2017-01-26 MED ORDER — SACUBITRIL-VALSARTAN 24-26 MG PO TABS
1.0000 | ORAL_TABLET | Freq: Two times a day (BID) | ORAL | Status: DC
  Administered 2017-01-27 – 2017-01-28 (×4): 1 via ORAL
  Filled 2017-01-26 (×4): qty 1

## 2017-01-26 MED ORDER — IPRATROPIUM-ALBUTEROL 0.5-2.5 (3) MG/3ML IN SOLN
3.0000 mL | Freq: Four times a day (QID) | RESPIRATORY_TRACT | Status: DC
  Administered 2017-01-27 – 2017-01-28 (×5): 3 mL via RESPIRATORY_TRACT
  Filled 2017-01-26 (×6): qty 3

## 2017-01-26 MED ORDER — FUROSEMIDE 10 MG/ML IJ SOLN
40.0000 mg | Freq: Once | INTRAMUSCULAR | Status: AC
  Administered 2017-01-26: 40 mg via INTRAVENOUS
  Filled 2017-01-26: qty 4

## 2017-01-26 MED ORDER — HYDROCOD POLST-CPM POLST ER 10-8 MG/5ML PO SUER
5.0000 mL | Freq: Two times a day (BID) | ORAL | Status: DC | PRN
  Administered 2017-01-27: 5 mL via ORAL
  Filled 2017-01-26: qty 5

## 2017-01-26 MED ORDER — GABAPENTIN 100 MG PO CAPS
200.0000 mg | ORAL_CAPSULE | Freq: Three times a day (TID) | ORAL | Status: DC
  Administered 2017-01-27 – 2017-01-28 (×5): 200 mg via ORAL
  Filled 2017-01-26 (×5): qty 2

## 2017-01-26 MED ORDER — INSULIN ASPART 100 UNIT/ML ~~LOC~~ SOLN
0.0000 [IU] | Freq: Three times a day (TID) | SUBCUTANEOUS | Status: DC
  Administered 2017-01-27: 2 [IU] via SUBCUTANEOUS
  Administered 2017-01-27: 1 [IU] via SUBCUTANEOUS
  Administered 2017-01-27 – 2017-01-28 (×2): 2 [IU] via SUBCUTANEOUS
  Filled 2017-01-26 (×5): qty 1

## 2017-01-26 MED ORDER — PANTOPRAZOLE SODIUM 40 MG PO TBEC
40.0000 mg | DELAYED_RELEASE_TABLET | Freq: Every day | ORAL | Status: DC
  Administered 2017-01-27 – 2017-01-28 (×2): 40 mg via ORAL
  Filled 2017-01-26 (×2): qty 1

## 2017-01-26 MED ORDER — BUDESONIDE 0.5 MG/2ML IN SUSP
0.5000 mg | Freq: Two times a day (BID) | RESPIRATORY_TRACT | Status: DC
  Administered 2017-01-27 – 2017-01-28 (×3): 0.5 mg via RESPIRATORY_TRACT
  Filled 2017-01-26 (×3): qty 2

## 2017-01-26 MED ORDER — AZITHROMYCIN 250 MG PO TABS
500.0000 mg | ORAL_TABLET | Freq: Every day | ORAL | Status: AC
Start: 2017-01-26 — End: 2017-01-27
  Administered 2017-01-27: 500 mg via ORAL
  Filled 2017-01-26: qty 2

## 2017-01-26 MED ORDER — CALCIUM CARBONATE ANTACID 500 MG PO CHEW
3.0000 | CHEWABLE_TABLET | Freq: Every day | ORAL | Status: DC
  Administered 2017-01-27 – 2017-01-28 (×2): 600 mg via ORAL
  Filled 2017-01-26 (×3): qty 3

## 2017-01-26 MED ORDER — CALCIUM CARBONATE 1500 (600 CA) MG PO TABS: 600.0000 mg | ORAL_TABLET | Freq: Every day | ORAL | Status: DC

## 2017-01-26 MED ORDER — PRAVASTATIN SODIUM 40 MG PO TABS
40.0000 mg | ORAL_TABLET | Freq: Every day | ORAL | Status: DC
  Administered 2017-01-27 (×2): 40 mg via ORAL
  Filled 2017-01-26 (×2): qty 1

## 2017-01-26 MED ORDER — APIXABAN 5 MG PO TABS
5.0000 mg | ORAL_TABLET | Freq: Two times a day (BID) | ORAL | Status: DC
  Administered 2017-01-27 – 2017-01-28 (×4): 5 mg via ORAL
  Filled 2017-01-26 (×4): qty 1

## 2017-01-26 MED ORDER — TRAMADOL HCL 50 MG PO TABS: 50.0000 mg | ORAL_TABLET | Freq: Four times a day (QID) | ORAL | Status: DC | PRN

## 2017-01-26 MED ORDER — INSULIN ASPART 100 UNIT/ML ~~LOC~~ SOLN
0.0000 [IU] | Freq: Every day | SUBCUTANEOUS | Status: DC
  Administered 2017-01-27: 3 [IU] via SUBCUTANEOUS
  Filled 2017-01-26: qty 1

## 2017-01-26 NOTE — ED Notes (Signed)
Pacemaker interrogated. 

## 2017-01-26 NOTE — ED Notes (Signed)
Pt turned, dried, and repositioned 

## 2017-01-26 NOTE — ED Triage Notes (Signed)
PT reporting SOB  And congestion X 1 week. Pt also reports syncope X 4 this week.

## 2017-01-26 NOTE — ED Notes (Signed)
Pt turned, dried, and diaper changed

## 2017-01-26 NOTE — ED Notes (Signed)
Pt sounds the same as prior to nebulizer - she also has a congested cough with thick phlegm in her throat that causes her to gag when she coughs

## 2017-01-26 NOTE — H&P (Signed)
[facility] NAME: Amber [name]    MR#:  [id number]  DATE OF BIRTH:  Aug 25, 1929  DATE OF ADMISSION:  01/26/2017  PRIMARY CARE PHYSICIAN: Idelle Crouch, MD   REQUESTING/REFERRING PHYSICIAN: Dr Charolett Bumpers  CHIEF COMPLAINT:   Chief Complaint  Patient presents with  . Shortness of Breath    HISTORY OF PRESENT ILLNESS:  Amber [name]  is a 83 y.o. female presents with shortness of breath and cough. She states her cough has been terrible. She picked up a touch of a cold. Family member also recently sick. She passed out 4 times since yesterday with the coughing. As per family member she sleeps a lot. She's also been having trouble breathing and her legs are bothersome. Hospitalist services contacted for further evaluation.  PAST MEDICAL HISTORY:   Past Medical History:  Diagnosis Date  . Anemia   . Anxiety   . Arthritis   . Benign neoplasm of colon   . CHF (congestive heart failure) ([place])   . Chronic kidney disease   . Chronic pain   . Complication of anesthesia 1980's   hard time waking up  . Coronary artery disease with unspecified angina pectoris   . Cough   . Diabetes mellitus without complication ([place])   . Essential hypertension   . High risk medication use   . Hyperlipidemia   . Plantar fascial fibromatosis   . Presence of permanent cardiac pacemaker   . Shortness of breath dyspnea     PAST SURGICAL HISTORY:   Past Surgical History:  Procedure Laterality Date  . ABDOMINAL HYSTERECTOMY    . BACK SURGERY  1960's   cage and screws in lower back  . CATARACT EXTRACTION W/ INTRAOCULAR LENS  IMPLANT, BILATERAL Bilateral   . CORONARY ARTERY BYPASS GRAFT  2008  . JOINT REPLACEMENT Right    hip and knee  . ORIF ANKLE FRACTURE Right 07/27/2014   Procedure: OPEN REDUCTION INTERNAL FIXATION (ORIF) ANKLE FRACTURE;  Surgeon: Alta Corning, MD;  Location: [facility];  Service: Orthopedics;  Laterality:  Right;  . PACEMAKER INSERTION Left 03/14/2015   Procedure: INSERTION PACEMAKER;  Surgeon: Isaias Cowman, MD;  Location: [facility];  Service: Cardiovascular;  Laterality: Left;  . ROTATOR CUFF REPAIR    . TOTAL HIP ARTHROPLASTY Right   . TOTAL KNEE ARTHROPLASTY Right   . VEIN LIGATION AND STRIPPING      SOCIAL HISTORY:   Social History  Substance Use Topics  . Smoking status: Never Smoker  . Smokeless tobacco: Never Used  . Alcohol use No    FAMILY HISTORY:   Family History  Problem Relation Age of Onset  . Heart attack Mother   . Heart disease Father   . Alzheimer's disease Sister   . Cervical cancer Sister   . Heart failure Son     DRUG ALLERGIES:   Allergies  Allergen Reactions  . Ambien [Zolpidem Tartrate] Other (See Comments)    Reaction:  Keeps pt awake   . Etodolac Other (See Comments)    Reaction:  Unknown   . Iodine Itching  . Nsaids Other (See Comments)    Reaction:  Unknown   . Penicillins Itching and Other (See Comments)    Has patient had a PCN reaction causing immediate rash, facial/tongue/throat swelling, SOB or lightheadedness with hypotension: No Has patient had a PCN reaction causing severe rash involving mucus membranes or skin necrosis: No Has patient had a  PCN reaction that required hospitalization No Has patient had a PCN reaction occurring within the last 10 years: No If all of the above answers are "NO", then may proceed with Cephalosporin use.  . Succinylcholine Other (See Comments)    Reaction:  Unknown     REVIEW OF SYSTEMS:  CONSTITUTIONAL: Positive for fever. Positive for fatigue.   EYES: No blurred or double vision. Wears glasses EARS, NOSE, AND THROAT: No tinnitus or ear pain. Positive for runny nose and sore throat RESPIRATORY: Positive for cough, shortness of breath, and wheezing. No hemoptysis.  CARDIOVASCULAR: No chest pain, orthopnea, edema.  GASTROINTESTINAL: Positive for nausea. No vomiting, diarrhea or abdominal pain. No  blood in bowel movements GENITOURINARY: No dysuria, hematuria.  ENDOCRINE: No polyuria, nocturia,  HEMATOLOGY: No anemia, easy bruising or bleeding SKIN: No rash or lesion. MUSCULOSKELETAL: Arthritis in the hands, knees, legs and left hip  NEUROLOGIC: No tingling, numbness, weakness.  PSYCHIATRY: No anxiety or depression.   MEDICATIONS AT HOME:   Prior to Admission medications   Medication Sig Start Date End Date Taking? Authorizing Provider  alendronate (FOSAMAX) 70 MG tablet Take 70 mg by mouth every Thursday.    Yes [provider]  amLODipine (NORVASC) 10 MG tablet Take 5 mg by mouth daily.    Yes [provider]  apixaban (ELIQUIS) 5 MG TABS tablet Take 5 mg by mouth 2 (two) times daily.   Yes [provider]  calcium carbonate (OSCAL) 1500 (600 Ca) MG TABS tablet Take 600 mg of elemental calcium by mouth daily with breakfast.   Yes [provider]  furosemide (LASIX) 20 MG tablet Take 1 tablet (20 mg total) by mouth daily. 08/07/16 08/07/17 Yes Hackney, Otila Kluver A, FNP  gabapentin (NEURONTIN) 100 MG capsule Take 200 mg by mouth 3 (three) times daily.    Yes [provider]  insulin aspart protamine- aspart (NOVOLOG MIX 70/30) (70-30) 100 UNIT/ML injection Inject 10 Units into the skin 2 (two) times daily with a meal.   Yes [provider]  Multiple Vitamins-Minerals (PRESERVISION AREDS 2) CAPS Take 1 capsule by mouth daily.   Yes [provider]  omeprazole (PRILOSEC) 20 MG capsule Take 20 mg by mouth daily.   Yes [provider]  pravastatin (PRAVACHOL) 40 MG tablet Take 40 mg by mouth at bedtime.   Yes [provider]  sacubitril-valsartan (ENTRESTO) 24-26 MG Take 1 tablet by mouth 2 (two) times daily.   Yes [provider]  ALPRAZolam Duanne Moron) 1 MG tablet Take 1 tablet (1 mg total) by mouth at bedtime as needed for anxiety. Patient taking differently: Take 0.5 mg by mouth at bedtime as needed for  anxiety.  07/31/14   Elgergawy, Silver Huguenin, MD  traMADol (ULTRAM) 50 MG tablet Take 1 tablet (50 mg total) by mouth every 6 (six) hours as needed. 01/18/17   Merlyn Lot, MD      VITAL SIGNS:  Blood pressure (!) 177/84, pulse 64, temperature 100.2 F (37.9 C), temperature source Oral, resp. rate 15, height 5\' 1"  (1.549 m), weight 81.6 kg (180 lb), SpO2 96 %.  PHYSICAL EXAMINATION:  GENERAL:  83 y.o.-year-old patient lying in the bed with no acute distress.  EYES: Pupils equal, round, reactive to light and accommodation. No scleral icterus. Extraocular muscles intact.  HEENT: Head atraumatic, normocephalic. Oropharynx and nasopharynx clear.  NECK:  Supple, no jugular venous distention. No thyroid enlargement, no tenderness.  LUNGS: Decreased breath sounds bilaterally, expiratory wheezing. No rales,rhonchi or crepitation.  No use of accessory muscles of respiration.  CARDIOVASCULAR: S1, S2 normal. No murmurs, rubs, or gallops.  ABDOMEN: Soft, nontender, nondistended. Bowel sounds present. No organomegaly or mass.  EXTREMITIES: Trace pedal edema. No cyanosis, or clubbing.  NEUROLOGIC: Cranial nerves II through XII are intact. Muscle strength 5/5 in all extremities. Sensation intact. Gait not checked.  PSYCHIATRIC: The patient is alert and oriented x 3.  SKIN: No rash, lesion, or ulcer.   LABORATORY PANEL:   CBC  Recent Labs Lab 01/26/17 1603  WBC 9.1  HGB 11.6*  HCT 35.8  PLT 287   ------------------------------------------------------------------------------------------------------------------  Chemistries   Recent Labs Lab 01/26/17 1603  NA 134*  K 4.2  CL 94*  CO2 28  GLUCOSE 309*  BUN 20  CREATININE 0.96  CALCIUM 9.1   ------------------------------------------------------------------------------------------------------------------  Cardiac Enzymes  Recent Labs Lab 01/26/17 1603  TROPONINI <0.03    ------------------------------------------------------------------------------------------------------------------  RADIOLOGY:  Dg Chest 2 View  Result Date: 01/26/2017 CLINICAL DATA:  One-week history of shortness of breath and chest congestion. Patient describes 4 near syncopal episodes this week. Current history of diabetes, hypertension, CHF and coronary artery disease with prior CABG. EXAM: CHEST  2 VIEW COMPARISON:  01/18/2017, 07/27/2016 and earlier. FINDINGS: Prior sternotomy for CABG. Cardiac silhouette moderately enlarged, unchanged. Left subclavian single lead transvenous pacemaker unchanged with its lead tip at the expected location of the RV apex. Thoracic aorta atherosclerotic, unchanged. Hilar and mediastinal contours otherwise unremarkable. Lungs clear. Bronchovascular markings normal. Pulmonary vascularity normal. No visible pleural effusions. No pneumothorax. Degenerative changes throughout the thoracic and upper lumbar spine. Prior L2-3 fusion. Old healed anterior left rib fractures as noted previously. Prior left shoulder arthroplasty with anatomic alignment. Severe degenerative changes involving the right shoulder. IMPRESSION: Stable cardiomegaly.  No acute cardiopulmonary disease. Electronically Signed   By: Evangeline Dakin M.D.   On: 01/26/2017 15:56     IMPRESSION AND PLAN:   1. Asthmatic bronchitis. Start DuoNeb nebulizer solution with budesonide nebulizers. I am hesitant on systemic steroids with elevation of sugars. Add a Z-Pak. Check pro calcitonin and respiratory panel. 2. Chronic cough. Chest x-ray negative. Could have an asthmatic cause of this. Trial of budesonide nebulizers. Other etiologies could be postnasal drip, GERD or even Entresto could cause. Also give a trial of cough suppressants. 3. Cough syncope. Trial of cough suppressants. Interrogate pacer. 4. Chronic systolic congestive heart failure. Chest x-ray negative. When somebody is on Entresto, the BNP is  not a reliable marker anymore. The BNP will be falsely elevated. 5. Type 2 diabetes mellitus uncontrolled. Check a hemoglobin A1c. Continue 7030 insulin but increase dose to 15 units twice a day. Sliding scale also. 6. Chronic kidney disease stage II 7. Accelerated hypertension. Give Norvasc this evening. Continue Entresto. Add low-dose beta blocker since the patient already has a pacemaker. 8. History of atrial fibrillation on Eliquis 9. Anxiety on Xanax 10. GERD on PPI 11. Hyperlipidemia unspecified on pravastatin 12 diabetic neuropathy on gabapentin    All the records are reviewed and case discussed with ED provider. Management plans discussed with the patient, family and they are in agreement.  CODE STATUS: DO NOT RESUSCITATE  TOTAL TIME TAKING CARE OF THIS PATIENT: 55 minutes.    Loletha Grayer M.D on 01/26/2017 at 9:14 PM  Between 7am to 6pm - Pager - [phone]  After 6pm call admission pager [phone]  [facility]  [phone]  CC: Primary care physician; Idelle Crouch, MD

## 2017-01-26 NOTE — ED Provider Notes (Signed)
[facility] Emergency Department Provider Note   ____________________________________________   I have reviewed the triage vital signs and the nursing notes.   HISTORY  Chief Complaint Shortness of Breath   History limited by: Not Limited   HPI Amber [name] is a 83 y.o. female who presents to the emergency department today with primary concerns for shortness of breath, congestion. The patient states that she has been feeling bad for the past couple of days. She thinks that she got it from family. Multiple members of the family have had congestion. She has been having some production with the cough. She also states that she has been having syncopal episodes for months. Family states she has had multiple in the past week. She states she is being worked up with her primary care doctor for the syncope.    Past Medical History:  Diagnosis Date  . Anemia   . Anxiety   . Arthritis   . Benign neoplasm of colon   . CHF (congestive heart failure) ([place])   . Chronic kidney disease   . Chronic pain   . Complication of anesthesia 1980's   hard time waking up  . Coronary artery disease with unspecified angina pectoris   . Cough   . Diabetes mellitus without complication ([place])   . Essential hypertension   . High risk medication use   . Hyperlipidemia   . Plantar fascial fibromatosis   . Presence of permanent cardiac pacemaker   . Shortness of breath dyspnea     Patient Active Problem List   Diagnosis Date Noted  . Fall 10/06/2016  . Anxiety 08/08/2016  . TIA (transient ischemic attack) 07/25/2015  . Sick sinus syndrome ([place]) 03/14/2015  . Bradycardia 02/16/2015  . Chronic heart failure ([place]) 01/05/2015  . Fracture dislocation of ankle 07/27/2014  . IDDM (insulin dependent diabetes mellitus) ([place]) 07/27/2014  . Essential hypertension 07/27/2014  . Multiple rib fractures 07/27/2014  . 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 ABDOMINAL HYSTERECTOMY    . BACK SURGERY  1960's   cage and screws in lower back  . CATARACT EXTRACTION W/ INTRAOCULAR LENS  IMPLANT, BILATERAL Bilateral   . CORONARY ARTERY BYPASS GRAFT  2008  . JOINT REPLACEMENT Right    hip and knee  . ORIF ANKLE FRACTURE Right 07/27/2014   Procedure: OPEN REDUCTION INTERNAL FIXATION (ORIF) ANKLE FRACTURE;  Surgeon: Alta Corning, MD;  Location: [facility];  Service: Orthopedics;  Laterality: Right;  . PACEMAKER INSERTION Left 03/14/2015   Procedure: INSERTION PACEMAKER;  Surgeon: Isaias Cowman, MD;  Location: [facility];  Service: Cardiovascular;  Laterality: Left;  . ROTATOR CUFF REPAIR    . TOTAL HIP ARTHROPLASTY Right   . TOTAL KNEE ARTHROPLASTY Right   . VEIN LIGATION AND STRIPPING      Prior to Admission medications   Medication Sig Start Date End Date Taking? Authorizing Provider  alendronate (FOSAMAX) 70 MG tablet Take 70 mg by mouth every Thursday.     [provider]  ALPRAZolam Duanne Moron) 1 MG tablet Take 1 tablet (1 mg total) by mouth at bedtime as needed for anxiety. Patient taking differently: Take 0.5 mg by mouth at bedtime as needed for anxiety.  07/31/14   Elgergawy, Silver Huguenin, MD  amLODipine (NORVASC) 10 MG tablet Take 5 mg by mouth daily.     [provider]  apixaban (ELIQUIS) 5 MG TABS tablet Take 5 mg  by mouth 2 (two) times daily.    [provider]  calcium carbonate (OSCAL) 1500 (600 Ca) MG TABS tablet Take 600 mg of elemental calcium by mouth daily with breakfast.    [provider]  furosemide (LASIX) 20 MG tablet Take 1 tablet (20 mg total) by mouth daily. 08/07/16 08/07/17  Alisa Graff, FNP  gabapentin (NEURONTIN) 100 MG capsule Take 200 mg by mouth 3 (three) times daily.     [provider]  insulin aspart protamine- aspart (NOVOLOG MIX 70/30) (70-30) 100 UNIT/ML injection Inject 10 Units into the skin 2 (two) times daily with a meal.    [provider]  losartan (COZAAR) 100 MG tablet Take 100 mg by mouth daily.    [provider]  Multiple Vitamins-Minerals (PRESERVISION AREDS 2) CAPS Take 1 capsule by mouth daily.    [provider]  omeprazole (PRILOSEC) 20 MG capsule Take 20 mg by mouth daily.    [provider]  pravastatin (PRAVACHOL) 40 MG tablet Take 40 mg by mouth at bedtime.    [provider]  traMADol (ULTRAM) 50 MG tablet Take 1 tablet (50 mg total) by mouth every 6 (six) hours as needed. 01/18/17   Merlyn Lot, MD  VENTOLIN HFA 108 [phone] Base) MCG/ACT inhaler Inhale 2 puffs into the lungs every 6 (six) hours as needed. 06/01/16   [provider]    Allergies Ambien [zolpidem tartrate]; Etodolac; Iodine; Nsaids; Penicillins; and Succinylcholine  Family History  Problem Relation Age of Onset  . Heart attack Mother   . Heart disease Father   . Alzheimer's disease Sister   . Cervical cancer Sister   . Heart failure Son     Social History Social History  Substance Use Topics  . Smoking status: Never Smoker  . Smokeless tobacco: Never Used  . Alcohol use No    Review of Systems Constitutional: No fever/chills Eyes: No visual changes. ENT: Positive for sore throat.  Cardiovascular: Denies chest pain. Respiratory: Positive for shortness of breath. Gastrointestinal: No abdominal pain.  No nausea, no vomiting.  No diarrhea.   Genitourinary: Negative for dysuria. Musculoskeletal: Negative for back pain. Skin: Negative for rash. Neurological: Negative for headaches, focal weakness or numbness.  ____________________________________________   PHYSICAL EXAM:  VITAL SIGNS: ED Triage Vitals  Enc Vitals Group     BP 01/26/17 1528 (!) 137/119     Pulse Rate 01/26/17 1528 (!) 59     Resp 01/26/17 1528 (!) 22     Temp 01/26/17 1528 100.2 F (37.9 C)     Temp Source 01/26/17 1528 Oral     SpO2 01/26/17 1528 96 %     Weight 01/26/17 1529 180 lb (81.6 kg)      Height 01/26/17 1529 5\' 1"  (1.549 m)   Constitutional: Alert and oriented.  Eyes: Conjunctivae are normal.  ENT   Head: Normocephalic and atraumatic.   Nose: No congestion/rhinnorhea.   Mouth/Throat: Mucous membranes are moist.   Neck: No stridor. Hematological/Lymphatic/Immunilogical: No cervical lymphadenopathy. Cardiovascular: Normal rate, regular rhythm.  No murmurs, rubs, or gallops. Respiratory: Increased respiratory rate, diffuse rhonchi. Diffuse wheezing.  Gastrointestinal: Soft and non tender. No rebound. No guarding.  Genitourinary: Deferred Musculoskeletal: Normal range of motion in all extremities. No lower extremity edema. Neurologic:  Normal speech and language. No gross focal neurologic deficits are appreciated.  Skin:  Skin is warm, dry and intact. No rash noted. Psychiatric: Mood and affect are normal. Speech and behavior are  normal. Patient exhibits appropriate insight and judgment.  ____________________________________________    LABS (pertinent positives/negatives)  Labs Reviewed  BASIC METABOLIC PANEL - Abnormal; Notable for the following:       Result Value   Sodium 134 (*)    Chloride 94 (*)    Glucose, Bld 309 (*)    GFR calc non Af Amer 53 (*)    All other components within normal limits  CBC - Abnormal; Notable for the following:    Hemoglobin 11.6 (*)    RDW 17.9 (*)    All other components within normal limits  URINALYSIS, COMPLETE (UACMP) WITH MICROSCOPIC - Abnormal; Notable for the following:    Color, Urine YELLOW (*)    APPearance CLEAR (*)    Glucose, UA >=500 (*)    Hgb urine dipstick SMALL (*)    Protein, ur 30 (*)    Squamous Epithelial / LPF 0-5 (*)    All other components within normal limits  BRAIN NATRIURETIC PEPTIDE - Abnormal; Notable for the following:    B Natriuretic Peptide 787.0 (*)    All other components within normal limits  TROPONIN I  CBG MONITORING, ED      ____________________________________________   EKG  I, Nance Pear, attending physician, personally viewed and interpreted this EKG  EKG Time: 1535 Rate: 74 Rhythm: ventricular pace maker Axis: left axis deviation Intervals: qtc 499 QRS: wide ST changes: no st elevation equivalent Impression: abnormal ekg   ____________________________________________    RADIOLOGY  CXR   IMPRESSION:  Stable cardiomegaly. No acute cardiopulmonary disease.     ____________________________________________   PROCEDURES  Procedures  ____________________________________________   INITIAL IMPRESSION / ASSESSMENT AND PLAN / ED COURSE  Pertinent labs & imaging results that were available during my care of the patient were reviewed by me and considered in my medical decision making (see chart for details).  Patient presented to the emergency department today with primary concern for cough congestion and upper respiratory illness type symptoms. Patient did get slight improvement after DuoNeb treatments. However BNP was elevated. Chest x-ray without any concerning findings. Giving continued shortness of breath will admit to the hospital service.  ____________________________________________   FINAL CLINICAL IMPRESSION(S) / ED DIAGNOSES  Final diagnoses:  Shortness of breath  Elevated brain natriuretic peptide (BNP) level  Syncope, unspecified syncope type     Note: This dictation was prepared with Dragon dictation. Any transcriptional errors that result from this process are unintentional     Nance Pear, MD 01/26/17 2036

## 2017-01-26 NOTE — ED Notes (Signed)
Patient transported to X-ray 

## 2017-01-27 ENCOUNTER — Inpatient Hospital Stay
Admit: 2017-01-27 | Discharge: 2017-01-27 | Disposition: A | Discharge status: Home / Self Care | Payer: Medicare HMO | Attending: Internal Medicine | Admitting: Internal Medicine

## 2017-01-27 LAB — RESPIRATORY PANEL BY PCR
ADENOVIRUS-RVPPCR: NOT DETECTED
BORDETELLA PERTUSSIS-RVPCR: NOT DETECTED
CORONAVIRUS 229E-RVPPCR: NOT DETECTED
CORONAVIRUS HKU1-RVPPCR: NOT DETECTED
CORONAVIRUS NL63-RVPPCR: NOT DETECTED
Chlamydophila pneumoniae: NOT DETECTED
Coronavirus OC43: NOT DETECTED
Influenza A: NOT DETECTED
Influenza B: NOT DETECTED
METAPNEUMOVIRUS-RVPPCR: NOT DETECTED
Mycoplasma pneumoniae: NOT DETECTED
PARAINFLUENZA VIRUS 1-RVPPCR: NOT DETECTED
PARAINFLUENZA VIRUS 2-RVPPCR: NOT DETECTED
PARAINFLUENZA VIRUS 3-RVPPCR: NOT DETECTED
Parainfluenza Virus 4: NOT DETECTED
RHINOVIRUS / ENTEROVIRUS - RVPPCR: DETECTED — AB
Respiratory Syncytial Virus: NOT DETECTED

## 2017-01-27 LAB — ECHOCARDIOGRAM COMPLETE
Height: 62 in
Weight: 2990.4 oz

## 2017-01-27 LAB — BASIC METABOLIC PANEL
Anion gap: 12 (ref 5–15)
BUN: 16 mg/dL (ref 6–20)
CO2: 29 mmol/L (ref 22–32)
Calcium: 8.9 mg/dL (ref 8.9–10.3)
Chloride: 96 mmol/L — ABNORMAL LOW (ref 101–111)
Creatinine, Ser: 0.89 mg/dL (ref 0.44–1.00)
GFR calc Af Amer: >60 mL/min (ref >60)
GFR calc non Af Amer: 58 mL/min — ABNORMAL LOW (ref >60)
GLUCOSE: 190 mg/dL — AB (ref 65–99)
POTASSIUM: 3.5 mmol/L (ref 3.5–5.1)
Sodium: 137 mmol/L (ref 135–145)

## 2017-01-27 LAB — CBC
HEMATOCRIT: 32.1 % — AB (ref 35.0–47.0)
HEMOGLOBIN: 10.6 g/dL — AB (ref 12.0–16.0)
MCH: 28.2 pg (ref 26.0–34.0)
MCHC: 33 g/dL (ref 32.0–36.0)
MCV: 85.5 fL (ref 80.0–100.0)
Platelets: 265 10*3/uL (ref 150–440)
RBC: 3.76 MIL/uL — ABNORMAL LOW (ref 3.80–5.20)
RDW: 17.6 % — ABNORMAL HIGH (ref 11.5–14.5)
WBC: 7.5 10*3/uL (ref 3.6–11.0)

## 2017-01-27 LAB — GLUCOSE, CAPILLARY
GLUCOSE-CAPILLARY: 130 mg/dL — AB (ref 65–99)
GLUCOSE-CAPILLARY: 177 mg/dL — AB (ref 65–99)
GLUCOSE-CAPILLARY: 178 mg/dL — AB (ref 65–99)
GLUCOSE-CAPILLARY: 237 mg/dL — AB (ref 65–99)
Glucose-Capillary: 269 mg/dL — ABNORMAL HIGH (ref 65–99)
Glucose-Capillary: 147 mg/dL — ABNORMAL HIGH (ref 65–99)

## 2017-01-27 LAB — PROCALCITONIN: Procalcitonin: <0.1 ng/mL

## 2017-01-27 MED ORDER — ACETAMINOPHEN 325 MG PO TABS: 650.0000 mg | ORAL_TABLET | Freq: Four times a day (QID) | ORAL | Status: DC | PRN

## 2017-01-27 MED ORDER — PHENOL 1.4 % MT LIQD: 1.0000 | OROMUCOSAL | Status: DC | PRN

## 2017-01-27 MED ORDER — CARVEDILOL 6.25 MG PO TABS
6.2500 mg | ORAL_TABLET | Freq: Two times a day (BID) | ORAL | Status: DC
  Administered 2017-01-27 – 2017-01-28 (×2): 6.25 mg via ORAL
  Filled 2017-01-27 (×2): qty 1

## 2017-01-27 NOTE — Consult Note (Signed)
Amber [name]  CARDIOLOGY CONSULT NOTE  Patient ID: Amber [name] MRN: [id number] DOB/AGE: 01-11-1930 83 y.o.    Admit date: 01/26/2017 Referring Physician Dr. Manuella Ghazi Primary Physician   Primary Cardiologist Dr. Nehemiah Massed Reason for Consultation pacemaker malfunction  HPI: Pt is a 83 yo female with history of high grade heart block with afib and permanant pacemaker who was admitted with bronchitic symptoms. EKG showed probable afib with ventricular paced beats. Pt states she has had episodes of syncope. Pacemaker was interoggated in the er and is functioning normally both sensing and pacing as programmed at lower rate of 60 and upper rate of 130. Review of telemetry reveals probable afib with intermitantly v paced beats with some conducted beats with appropriate sensing. Pt has a know systolic cardiomyopathy with ef of 25%. She has been taking apixaban at 5 mg bid and amlodipine 5 mg daily.  Review of Systems  HENT: Negative.   Eyes: Negative.   Respiratory: Positive for shortness of breath and wheezing.   Cardiovascular: Negative.   Gastrointestinal: Negative.   Genitourinary: Negative.   Musculoskeletal: Negative.   Skin: Negative.   Neurological: Positive for weakness.  Endo/Heme/Allergies: Negative.   Psychiatric/Behavioral: Negative.     Past Medical History:  Diagnosis Date  . Anemia   . Anxiety   . Arthritis   . Benign neoplasm of colon   . CHF (congestive heart failure) ([place])   . Chronic kidney disease   . Chronic pain   . Complication of anesthesia 1980's   hard time waking up  . Coronary artery disease with unspecified angina pectoris   . Cough   . Diabetes mellitus without complication ([place])   . Essential hypertension   . High risk medication use   . Hyperlipidemia   . Plantar fascial fibromatosis   . Presence of permanent cardiac pacemaker   . Shortness of breath dyspnea     Family History  Problem  Relation Age of Onset  . Heart attack Mother   . Heart disease Father   . Alzheimer's disease Sister   . Cervical cancer Sister   . Heart failure Son     Social History   Social History  . Marital status: Widowed    Spouse name: N/A  . Number of children: N/A  . Years of education: N/A   Occupational History  . Not on file.   Social History Main Topics  . Smoking status: Never Smoker  . Smokeless tobacco: Never Used  . Alcohol use No  . Drug use: No  . Sexual activity: Not on file   Other Topics Concern  . Not on file   Social History Narrative  . No narrative on file    Past Surgical History:  Procedure Laterality Date  . ABDOMINAL HYSTERECTOMY    . BACK SURGERY  1960's   cage and screws in lower back  . CATARACT EXTRACTION W/ INTRAOCULAR LENS  IMPLANT, BILATERAL Bilateral   . CORONARY ARTERY BYPASS GRAFT  2008  . JOINT REPLACEMENT Right    hip and knee  . ORIF ANKLE FRACTURE Right 07/27/2014   Procedure: OPEN REDUCTION INTERNAL FIXATION (ORIF) ANKLE FRACTURE;  Surgeon: Alta Corning, MD;  Location: [facility];  Service: Orthopedics;  Laterality: Right;  . PACEMAKER INSERTION Left 03/14/2015   Procedure: INSERTION PACEMAKER;  Surgeon: Isaias Cowman, MD;  Location: [facility];  Service: Cardiovascular;  Laterality: Left;  . ROTATOR CUFF REPAIR    .  TOTAL HIP ARTHROPLASTY Right   . TOTAL KNEE ARTHROPLASTY Right   . VEIN LIGATION AND STRIPPING       Prescriptions Prior to Admission  Medication Sig Dispense Refill Last Dose  . alendronate (FOSAMAX) 70 MG tablet Take 70 mg by mouth every Thursday.    Past Week at Unknown time  . amLODipine (NORVASC) 10 MG tablet Take 5 mg by mouth daily.    01/26/2017 at am  . apixaban (ELIQUIS) 5 MG TABS tablet Take 5 mg by mouth 2 (two) times daily.   01/26/2017 at am  . calcium carbonate (OSCAL) 1500 (600 Ca) MG TABS tablet Take 600 mg of elemental calcium by mouth daily with breakfast.   01/25/2017 at Unknown time  . furosemide  (LASIX) 20 MG tablet Take 1 tablet (20 mg total) by mouth daily. 90 tablet 3 01/26/2017 at am  . gabapentin (NEURONTIN) 100 MG capsule Take 200 mg by mouth 3 (three) times daily.    01/26/2017 at am  . insulin aspart protamine- aspart (NOVOLOG MIX 70/30) (70-30) 100 UNIT/ML injection Inject 10 Units into the skin 2 (two) times daily with a meal.   01/26/2017 at am  . Multiple Vitamins-Minerals (PRESERVISION AREDS 2) CAPS Take 1 capsule by mouth daily.   Past Month at Unknown time  . omeprazole (PRILOSEC) 20 MG capsule Take 20 mg by mouth daily.   01/26/2017 at am  . pravastatin (PRAVACHOL) 40 MG tablet Take 40 mg by mouth at bedtime.   01/25/2017 at Unknown time  . sacubitril-valsartan (ENTRESTO) 24-26 MG Take 1 tablet by mouth 2 (two) times daily.   01/26/2017 at am  . ALPRAZolam (XANAX) 1 MG tablet Take 1 tablet (1 mg total) by mouth at bedtime as needed for anxiety. (Patient taking differently: Take 0.5 mg by mouth at bedtime as needed for anxiety. ) 30 tablet 0 prn at prn  . traMADol (ULTRAM) 50 MG tablet Take 1 tablet (50 mg total) by mouth every 6 (six) hours as needed. 10 tablet 0 prn at prn    Physical Exam: Blood pressure (!) 150/62, pulse (!) 58, temperature (!) 97.3 F (36.3 C), temperature source Oral, resp. rate 16, height 5\' 2"  (1.575 m), weight 84.8 kg (186 lb 14.4 oz), SpO2 92 %.   Wt Readings from Last 1 Encounters:  01/27/17 84.8 kg (186 lb 14.4 oz)     General appearance: alert and cooperative Neck: no adenopathy, no carotid bruit, no JVD, supple, symmetrical, trachea midline and thyroid not enlarged, symmetric, no tenderness/mass/nodules Resp: rhonchi bibasilar and wheezes bibasilar Cardio: regular rate and rhythm GI: soft, non-tender; bowel sounds normal; no masses,  no organomegaly Neurologic: Grossly normal  Labs:   Lab Results  Component Value Date   WBC 7.5 01/27/2017   HGB 10.6 (L) 01/27/2017   HCT 32.1 (L) 01/27/2017   MCV 85.5 01/27/2017   PLT 265 01/27/2017     Recent Labs Lab 01/27/17 0452  NA 137  K 3.5  CL 96*  CO2 29  BUN 16  CREATININE 0.89  CALCIUM 8.9  GLUCOSE 190*   Lab Results  Component Value Date   TROPONINI <0.03 01/26/2017      Radiology: stable with no acute cardiopulmonary disease EKG: afib with intermitant v paced beats and conducted beats.  ASSESSMENT AND PLAN:  No evidence of pacemaker malfunciton. Device is pacing and capturing normally. Device was interggated and no malfunciton was found. Etiology of reported syncope is unclear but not due to pacemaker malfunciton. Will  add carvedilol due to cardiomyopathy. Continue with eliquis  Signed: Teodoro Spray MD, [facility] 01/27/2017, 6:52 PM

## 2017-01-27 NOTE — Care Management (Signed)
From home.  Chronic Entresto. No home oxygen.  PT consult pending.  Will require home oxygen assessment.  has had some room air sats down to 87

## 2017-01-27 NOTE — Progress Notes (Signed)
Inpatient Diabetes Program Recommendations  AACE/ADA: New Consensus Statement on Inpatient Glycemic Control (2015)  Target Ranges:  Prepandial:   less than 140 mg/dL      Peak postprandial:   less than 180 mg/dL (1-2 hours)      Critically ill patients:  140 - 180 mg/dL   Lab Results  Component Value Date   GLUCAP 147 (H) 01/27/2017   HGBA1C 6.8 (H) 07/26/2015    Review of Glycemic Control  Results for Amber [name], Amber [name] (MRN [id number]) as of 01/27/2017 08:19  Ref. Range 01/27/2017 00:42 01/27/2017 01:28 01/27/2017 07:28  Glucose-Capillary Latest Ref Range: 65 - 99 mg/dL 237 (H) 269 (H) 147 (H)    Diabetes history: Type 2 Outpatient Diabetes medications: Novolog mix 70/30 10 units bid Current orders for Inpatient glycemic control: Novolog mix 70/30 15 units bid, Novolog 0-9 units tid, Novolog 0-5 units qhs  Inpatient Diabetes Program Recommendations:  Consider stopping Novolog insulin while inpatient and consider Levemir 4 units bid- continue Novolog correction as ordered. Continue Novolog correction as ordered.   If her intake is poor, 70/30 insulin posses a risk of hypoglycemia.   Gentry Fitz, RN, BA, MHA, CDE Diabetes Coordinator Inpatient Diabetes Program  [phone] (Team Pager) [phone] ([place]) 01/27/2017 8:21 AM

## 2017-01-27 NOTE — Evaluation (Signed)
Physical Therapy Evaluation Patient Details Name: Amber [name] MRN: [id number] DOB: 1929-07-30 Today's Date: 01/27/2017   History of Present Illness  Pt is A 83 yo F, admitted to actute care on 7/23 for asthmatic bronchitis. Prior to admission, pt ModI with all ADL's/IADL's, using SPC for amb. PMH: anemia, anxiety, arthritis, chronic pain, CAD, diabetess mellitus, HTN, HLD, pacemaker, and plantar facial fibromatosis.   Clinical Impression  Pt performs bed mobility with minA, tranfers ModI, and ambulation with CGA, secondary to impaired strength and cardiopulmonary endurance levels. Pt amb total of 80 ft with 3 seated rest breaks, secondary to impaired cardiopulmonary endurance. Pt O2 sat decreased to 80% with amb, with HR at 180; SPT had pt sit at EOB, and practice pursed lipped breathing. Within 2 min O2 sat at 91% and HR in 80's (HR stabilized within 30 sec). Nursing notified of pt O2 sat and HR response to amb. Pt presents with the following deficits: strength, endurance, and balance. Overall, pt responded well to today's treatment with no adverse affects. Pt would benefit from skilled PT to address the previously mentioned impairments and promote return to PLOF. Currently recommending HHPT, pending d/c.      Follow Up Recommendations Home health PT    Equipment Recommendations  Rolling walker with 5" wheels    Recommendations for Other Services       Precautions / Restrictions Precautions Precautions: Fall Restrictions Weight Bearing Restrictions: No      Mobility  Bed Mobility Overal bed mobility: Needs Assistance Bed Mobility: Supine to Sit     Supine to sit: Min assist     General bed mobility comments: MinA with supine to sit, requiring HOB elevation and use of L bed rail. Requires min verbal cues for mechanics and safety awareness.   Transfers Overall transfer level: Modified independent Equipment used: Rolling walker (2 wheeled)             General  transfer comment: ModI with STS transfers, requiring increased time and use of RW for B UE support. Required min sues for [employer] and safety awareness.   Ambulation/Gait Ambulation/Gait assistance: Min guard Ambulation Distance (Feet): 80 Feet Assistive device: Rolling walker (2 wheeled) Gait Pattern/deviations: Step-through pattern     General Gait Details: Good reciprocal gait pattern with use of RW, requiring min cues for staying close to RW, body mechanics, and safety awareness. Pt experienced one instance of urinating in floor when coughing while amb and 2 instances of urinating in chair when coughing during rest break. Pt required 3 seated rest breaks during amb after ~20 ft. After final bout of amb, pt O2 sat dropped to 80% and HR elevated to 180. Pt vitals stabilized after 2 min seated rest break, transitioning to supine with HOB elevated: O2 saturation at 91% and HR in 80's.   Stairs            Wheelchair Mobility    Modified Rankin (Stroke Patients Only)       Balance Overall balance assessment: Needs assistance Sitting-balance support: Bilateral upper extremity supported;Feet supported Sitting balance-Leahy Scale: Good Sitting balance - Comments: good sitting balance, requiring no cues for mechancis and safety awareness.    Standing balance support: Bilateral upper extremity supported Standing balance-Leahy Scale: Fair Standing balance comment: Fair standing balance, as pt required B UE support. Required no cues for safety.  Pertinent Vitals/Pain Pain Assessment: Faces Faces Pain Scale: Hurts little more Pain Location: chest, only from/when coughing.  Pain Descriptors / Indicators: Discomfort;Nagging (intermittent with cough) Pain Intervention(s): Monitored during session;Repositioned    Home Living Family/patient expects to be discharged to:: Private residence Living Arrangements: Children;Other relatives Available  Help at Discharge: Available 24 hours/day Type of Home: House Home Access: Ramped entrance     Home Layout: One level Home Equipment: Walker - standard;Cane - single point;Wheelchair - manual Additional Comments: Pt utilizes WC for sitting during the day, and primarily amb with SPC. Pt lives with son and daughter in-law and drives them to all appointments. They are able to stay in home with pt adn assist as needed.     Prior Function Level of Independence: Independent with assistive device(s)               Hand Dominance        Extremity/Trunk Assessment   Upper Extremity Assessment Upper Extremity Assessment: Overall WFL for tasks assessed    Lower Extremity Assessment Lower Extremity Assessment: Generalized weakness (MMT to B LE's 4/5 )       Communication   Communication: No difficulties  Cognition Arousal/Alertness: Awake/alert Behavior During Therapy: Impulsive Overall Cognitive Status: Within Functional Limits for tasks assessed                                 General Comments: Highly impulsive      General Comments      Exercises Other Exercises Other Exercises: Supine therex performed to B LE's with supervision x 10 reps: ankle pumps, quad sets, glute sets, SLR, adn hip abd. Pt with good mechancis, erquiring min verbal cues. Pt with good carry over. Pt on RA during duration of treatment. O2 saturation between 87-92%. SPT provided education re: pursed lipped breathing and refraining from breath holding.  Other Exercises: (P) toileting, requiring CGA for all hygiene and clothing manipualtion. Min cues for safety awareness.    Assessment/Plan    PT Assessment Patient needs continued PT services  PT Problem List Decreased strength;Decreased activity tolerance;Decreased balance;Decreased mobility;Decreased coordination;Decreased safety awareness       PT Treatment Interventions DME instruction;Gait training;Functional mobility  training;Therapeutic activities;Therapeutic exercise;Balance training;Neuromuscular re-education;Patient/family education    PT Goals (Current goals can be found in the Care Plan section)  Acute Rehab PT Goals Patient Stated Goal: to go home  PT Goal Formulation: With patient Time For Goal Achievement: 02/10/17 Potential to Achieve Goals: Good    Frequency Min 2X/week   Barriers to discharge        Co-evaluation               AM-PAC PT "6 Clicks" Daily Activity  Outcome Measure Difficulty turning over in bed (including adjusting bedclothes, sheets and blankets)?: Total Difficulty moving from lying on back to sitting on the side of the bed? : Total Difficulty sitting down on and standing up from a chair with arms (e.g., wheelchair, bedside commode, etc,.)?: A Little Help needed moving to and from a bed to chair (including a wheelchair)?: A Little Help needed walking in hospital room?: A Little Help needed climbing 3-5 steps with a railing? : A Lot 6 Click Score: 13    End of Session Equipment Utilized During Treatment: Gait belt Activity Tolerance: Other (comment) (Limited by cardiopulmonary response. ) Patient left: in bed;with bed alarm set;with call bell/phone within reach;with family/visitor present Nurse Communication:  Mobility status;Other (comment) (HR and O2 response to treatment. Pt urination episodes. ) PT Visit Diagnosis: Unsteadiness on feet (R26.81);Other abnormalities of gait and mobility (R26.89);Muscle weakness (generalized) (M62.81)    Time: 8003-4917 PT Time Calculation (min) (ACUTE ONLY): 33 min   Charges:         PT G Codes:        Oran Rein PT, SPT  Bevelyn Ngo 01/27/2017, 5:24 PM

## 2017-01-27 NOTE — Progress Notes (Signed)
*  PRELIMINARY RESULTS* Echocardiogram 2D Echocardiogram has been performed.  Amber [name] 01/27/2017, 9:41 AM

## 2017-01-27 NOTE — Progress Notes (Signed)
[place] [facility] [place] NAME: Amber [name]    MR#:  [id number]  DATE OF BIRTH:  06-12-1930  SUBJECTIVE:  CHIEF COMPLAINT:   Chief Complaint  Patient presents with  . Shortness of Breath  breathing somewhat better REVIEW OF SYSTEMS:  Review of Systems  Constitutional: Negative for chills, fever and weight loss.  HENT: Negative for nosebleeds and sore throat.   Eyes: Negative for blurred vision.  Respiratory: Positive for cough and shortness of breath. Negative for wheezing.   Cardiovascular: Negative for chest pain, orthopnea, leg swelling and PND.  Gastrointestinal: Negative for abdominal pain, constipation, diarrhea, heartburn, nausea and vomiting.  Genitourinary: Negative for dysuria and urgency.  Musculoskeletal: Negative for back pain.  Skin: Negative for rash.  Neurological: Negative for dizziness, speech change, focal weakness and headaches.  Endo/Heme/Allergies: Does not bruise/bleed easily.  Psychiatric/Behavioral: Negative for depression.    DRUG ALLERGIES:   Allergies  Allergen Reactions  . Ambien [Zolpidem Tartrate] Other (See Comments)    Reaction:  Keeps pt awake   . Etodolac Other (See Comments)    Reaction:  Unknown   . Iodine Itching  . Nsaids Other (See Comments)    Reaction:  Unknown   . Penicillins Itching and Other (See Comments)    Has patient had a PCN reaction causing immediate rash, facial/tongue/throat swelling, SOB or lightheadedness with hypotension: No Has patient had a PCN reaction causing severe rash involving mucus membranes or skin necrosis: No Has patient had a PCN reaction that required hospitalization No Has patient had a PCN reaction occurring within the last 10 years: No If all of the above answers are "NO", then may proceed with Cephalosporin use.  . Succinylcholine Other (See Comments)    Reaction:  Unknown    VITALS:  Blood pressure (!) 150/62, pulse (!) 58, temperature (!) 97.3  F (36.3 C), temperature source Oral, resp. rate 16, height 5\' 2"  (1.575 m), weight 84.8 kg (186 lb 14.4 oz), SpO2 92 %. PHYSICAL EXAMINATION:  Physical Exam  Constitutional: She is oriented to person, place, and time and well-developed, well-nourished, and in no distress.  HENT:  Head: Normocephalic and atraumatic.  Eyes: Pupils are equal, round, and reactive to light. Conjunctivae and EOM are normal.  Neck: Normal range of motion. Neck supple. No tracheal deviation present. No thyromegaly present.  Cardiovascular: Normal rate, regular rhythm and normal heart sounds.   Pulmonary/Chest: Effort normal and breath sounds normal. No respiratory distress. She has no wheezes. She exhibits no tenderness.  Abdominal: Soft. Bowel sounds are normal. She exhibits no distension. There is no tenderness.  Musculoskeletal: Normal range of motion.  Neurological: She is alert and oriented to person, place, and time. No cranial nerve deficit.  Skin: Skin is warm and dry. No rash noted.  Psychiatric: Mood and affect normal.   LABORATORY PANEL:  Female CBC  Recent Labs Lab 01/27/17 0452  WBC 7.5  HGB 10.6*  HCT 32.1*  PLT 265   ------------------------------------------------------------------------------------------------------------------ Chemistries   Recent Labs Lab 01/27/17 0452  NA 137  K 3.5  CL 96*  CO2 29  GLUCOSE 190*  BUN 16  CREATININE 0.89  CALCIUM 8.9   RADIOLOGY:  No results found. ASSESSMENT AND PLAN:  83 y.o. female presents with shortness of breath and cough.    1. Asthmatic bronchitis - continue DuoNeb with budesonide nebulizers which seem to help - continue Z-Pak. pro calcitonin normal so doubt bacterial infection.   2.  Chronic cough. Chest x-ray negative. Could have an asthmatic cause of this. Trial of budesonide nebulizers. Other etiologies could be postnasal drip, GERD or even Entresto could cause.  - trial of cough suppressants.  3. Syncope: due to constant  coughing/pacemaker malfunction - Trial of cough suppressants. Interrogate pacer. - cardio c/s  4. Chronic systolic congestive heart failure. Chest x-ray negative.   5. Type 2 diabetes mellitus uncontrolled - Continue 70/30 insulin 15 units twice a day. Sliding scale as well  6. Chronic kidney disease stage II: at baseline  7. Accelerated hypertension. - continue norvasc, coreg, entresto  8. History of atrial fibrillation on Eliquis, has pacer  9. Anxiety on Xanax 10. GERD on PPI 11. Hyperlipidemia unspecified on pravastatin 12 diabetic neuropathy on gabapentin     All the records are reviewed and case discussed with Care Management/Social Worker. Management plans discussed with the patient, nursing and they are in agreement.  CODE STATUS: DNR  TOTAL TIME TAKING CARE OF THIS PATIENT: 25 minutes.   More than 50% of the time was spent in counseling/coordination of care: YES  POSSIBLE D/C IN 1-2 DAYS, DEPENDING ON CLINICAL CONDITION. And cardio eval   Max Sane M.D on 01/27/2017 at 6:14 PM  Between 7am to 6pm - Pager - [phone]  After 6pm go to [email or web address] - Proofreader  [facility]  Office  [phone]  CC: Primary care physician; Idelle Crouch, MD  Note: This dictation was prepared with Dragon dictation along with smaller phrase technology. Any transcriptional errors that result from this process are unintentional.

## 2017-01-28 LAB — HEMOGLOBIN A1C
HEMOGLOBIN A1C: 8.8 % — AB (ref 4.8–5.6)
Mean Plasma Glucose: 206 mg/dL

## 2017-01-28 LAB — GLUCOSE, CAPILLARY: GLUCOSE-CAPILLARY: 180 mg/dL — AB (ref 65–99)

## 2017-01-28 LAB — PROCALCITONIN: Procalcitonin: <0.1 ng/mL

## 2017-01-28 MED ORDER — BENZONATATE 200 MG PO CAPS: 200.0000 mg | ORAL_CAPSULE | Freq: Three times a day (TID) | ORAL | 0 refills | Status: DC | PRN

## 2017-01-28 MED ORDER — INSULIN ASPART PROT & ASPART (70-30 MIX) 100 UNIT/ML ~~LOC~~ SUSP: 15.0000 [IU] | Freq: Two times a day (BID) | SUBCUTANEOUS | 11 refills | Status: DC

## 2017-01-28 MED ORDER — CARVEDILOL 6.25 MG PO TABS: 6.2500 mg | ORAL_TABLET | Freq: Two times a day (BID) | ORAL | 0 refills | Status: AC

## 2017-01-28 MED ORDER — PREDNISONE 5 MG PO TABS: ORAL_TABLET | ORAL | 0 refills | Status: DC

## 2017-01-28 MED ORDER — METHYLPREDNISOLONE SODIUM SUCC 40 MG IJ SOLR
40.0000 mg | Freq: Once | INTRAMUSCULAR | Status: AC
  Administered 2017-01-28: 40 mg via INTRAVENOUS
  Filled 2017-01-28: qty 1

## 2017-01-28 MED ORDER — ALBUTEROL SULFATE HFA 108 (90 BASE) MCG/ACT IN AERS: 2.0000 | INHALATION_SPRAY | Freq: Four times a day (QID) | RESPIRATORY_TRACT | 0 refills | Status: AC | PRN

## 2017-01-28 NOTE — Progress Notes (Signed)
Pt discharged with no concerns offered. Discharge paperwork reviewed with pt and family member. New medications reviewed, pt verbalized understanding of medication indications. Belonging sent with pt. IV removed with no issue. No SOB.

## 2017-01-28 NOTE — Discharge Summary (Signed)
[facility] NAME: Amber [name]    MR#:  [id number]  DATE OF BIRTH:  [birth year]  DATE OF ADMISSION:  01/26/2017 ADMITTING PHYSICIAN: Loletha Grayer, MD  DATE OF DISCHARGE: 01/28/2017 12:30 PM  PRIMARY CARE PHYSICIAN: Idelle Crouch, MD    ADMISSION DIAGNOSIS:  Shortness of breath [R06.02] Elevated brain natriuretic peptide (BNP) level [R79.89] Syncope, unspecified syncope type [R55]  DISCHARGE DIAGNOSIS:  Active Problems:   Asthmatic bronchitis   SECONDARY DIAGNOSIS:   Past Medical History:  Diagnosis Date  . Anemia   . Anxiety   . Arthritis   . Benign neoplasm of colon   . CHF (congestive heart failure) ([place])   . Chronic kidney disease   . Chronic pain   . Complication of anesthesia 1980's   hard time waking up  . Coronary artery disease with unspecified angina pectoris   . Cough   . Diabetes mellitus without complication ([place])   . Essential hypertension   . High risk medication use   . Hyperlipidemia   . Plantar fascial fibromatosis   . Presence of permanent cardiac pacemaker   . Shortness of breath dyspnea     HOSPITAL COURSE:   1. Asthmatic bronchitis. We started DuoNeb nebulizer solution with budesonide nebulizers. I did prescribe systemic steroids and prednisone taper upon going home. I stopped antibiotics because respiratory panel for rhinovirus/enterovirus and pro calcitonin was negative. Patient feeling better upon disposition. 2. Chronic cough seems better with Tessalon Perles 3. Cough syncope. Pacemaker interrogated and no arrhythmias. 4. Chronic systolic congestive heart failure. I do not think this was a congestive heart failure exacerbation. BNP is falsely elevated with those people on Entresto. Coreg prescribed. Consideration for spironolactone can be made as outpatient. 5. Type 2 diabetes mellitus uncontrolled hemoglobin A1c elevated at 8.8. 6. Chronic kidney disease stage II 7.  Accelerated hypertension on presentation. Blood pressure improved 8. History of atrial fibrillation on Eliquis 9. Anxiety on Xanax 10. GERD on PPI 11. Hyperlipidemia unspecified on pravastatin 12. Diabetic neuropathy on gabapentin  DISCHARGE CONDITIONS:   Satisfactory  CONSULTS OBTAINED:  Treatment Team:  Loletha Grayer, MD Teodoro Spray, MD  DRUG ALLERGIES:   Allergies  Allergen Reactions  . Ambien [Zolpidem Tartrate] Other (See Comments)    Reaction:  Keeps pt awake   . Etodolac Other (See Comments)    Reaction:  Unknown   . Iodine Itching  . Nsaids Other (See Comments)    Reaction:  Unknown   . Penicillins Itching and Other (See Comments)    Has patient had a PCN reaction causing immediate rash, facial/tongue/throat swelling, SOB or lightheadedness with hypotension: No Has patient had a PCN reaction causing severe rash involving mucus membranes or skin necrosis: No Has patient had a PCN reaction that required hospitalization No Has patient had a PCN reaction occurring within the last 10 years: No If all of the above answers are "NO", then may proceed with Cephalosporin use.  . Succinylcholine Other (See Comments)    Reaction:  Unknown     DISCHARGE MEDICATIONS:   Discharge Medication List as of 01/28/2017 11:47 AM    START taking these medications   Details  albuterol (PROVENTIL HFA;VENTOLIN HFA) 108 (90 Base) MCG/ACT inhaler Inhale 2 puffs into the lungs every 6 (six) hours as needed for wheezing or shortness of breath., Starting Wed 01/28/2017, Print    benzonatate (TESSALON) 200 MG capsule Take 1 capsule (200 mg total) by mouth 3 (  three) times daily as needed for cough., Starting Wed 01/28/2017, Print    carvedilol (COREG) 6.25 MG tablet Take 1 tablet (6.25 mg total) by mouth 2 (two) times daily with a meal., Starting Wed 01/28/2017, Print    predniSONE (DELTASONE) 5 MG tablet 4 tabs day1; 3 tabs po day2; 2 tabs po day3; 1 tab po day4, Print      CONTINUE  these medications which have CHANGED   Details  insulin aspart protamine- aspart (NOVOLOG MIX 70/30) (70-30) 100 UNIT/ML injection Inject 0.15 mLs (15 Units total) into the skin 2 (two) times daily with a meal., Starting Wed 01/28/2017, Normal      CONTINUE these medications which have NOT CHANGED   Details  alendronate (FOSAMAX) 70 MG tablet Take 70 mg by mouth every Thursday. , Historical Med    amLODipine (NORVASC) 10 MG tablet Take 5 mg by mouth daily. , Historical Med    apixaban (ELIQUIS) 5 MG TABS tablet Take 5 mg by mouth 2 (two) times daily., Historical Med    calcium carbonate (OSCAL) 1500 (600 Ca) MG TABS tablet Take 600 mg of elemental calcium by mouth daily with breakfast., Historical Med    furosemide (LASIX) 20 MG tablet Take 1 tablet (20 mg total) by mouth daily., Starting Thu 08/07/2016, Until Fri 08/07/2017, Normal    gabapentin (NEURONTIN) 100 MG capsule Take 200 mg by mouth 3 (three) times daily. , Historical Med    Multiple Vitamins-Minerals (PRESERVISION AREDS 2) CAPS Take 1 capsule by mouth daily., Historical Med    omeprazole (PRILOSEC) 20 MG capsule Take 20 mg by mouth daily., Historical Med    pravastatin (PRAVACHOL) 40 MG tablet Take 40 mg by mouth at bedtime., Historical Med    sacubitril-valsartan (ENTRESTO) 24-26 MG Take 1 tablet by mouth 2 (two) times daily., Historical Med    ALPRAZolam (XANAX) 1 MG tablet Take 1 tablet (1 mg total) by mouth at bedtime as needed for anxiety., Starting Mon 07/31/2014, Print    traMADol (ULTRAM) 50 MG tablet Take 1 tablet (50 mg total) by mouth every 6 (six) hours as needed., Starting Sun 01/18/2017, Print         DISCHARGE INSTRUCTIONS:   Follow-up PMD one week  If you experience worsening of your admission symptoms, develop shortness of breath, life threatening emergency, suicidal or homicidal thoughts you must seek medical attention immediately by calling 911 or calling your MD immediately  if symptoms less  severe.  You Must read complete instructions/literature along with all the possible adverse reactions/side effects for all the Medicines you take and that have been prescribed to you. Take any new Medicines after you have completely understood and accept all the possible adverse reactions/side effects.   Please note  You were cared for by a hospitalist during your hospital stay. If you have any questions about your discharge medications or the care you received while you were in the hospital after you are discharged, you can call the unit and asked to speak with the hospitalist on call if the hospitalist that took care of you is not available. Once you are discharged, your primary care physician will handle any further medical issues. Please note that NO REFILLS for any discharge medications will be authorized once you are discharged, as it is imperative that you return to your primary care physician (or establish a relationship with a primary care physician if you do not have one) for your aftercare needs so that they can reassess your need for  medications and monitor your lab values.    Today   CHIEF COMPLAINT:   Chief Complaint  Patient presents with  . Shortness of Breath    HISTORY OF PRESENT ILLNESS:  Asherah [name]  is a 83 y.o. female presented with shortness of breath   VITAL SIGNS:  Vital signs when I made the decision to discharge her were blood pressure 142/65 pulse 61 pulse ox 90% on room air respirations 18   PHYSICAL EXAMINATION:  GENERAL:  83 y.o.-year-old patient lying in the bed with no acute distress.  EYES: Pupils equal, round, reactive to light and accommodation. No scleral icterus. Extraocular muscles intact.  HEENT: Head atraumatic, normocephalic. Oropharynx and nasopharynx clear.  NECK:  Supple, no jugular venous distention. No thyroid enlargement, no tenderness.  LUNGS: Normal breath sounds bilaterally, Slight expiratory wheeze left base, rales,rhonchi or  crepitation. No use of accessory muscles of respiration.  CARDIOVASCULAR: S1, S2 normal. No murmurs, rubs, or gallops.  ABDOMEN: Soft, non-tender, non-distended. Bowel sounds present. No organomegaly or mass.  EXTREMITIES: Trace edema, no cyanosis, or clubbing.  NEUROLOGIC: Cranial nerves II through XII are intact. Muscle strength 5/5 in all extremities. Sensation intact. Gait not checked.  PSYCHIATRIC: The patient is alert and oriented x 3.  SKIN: No obvious rash, lesion, or ulcer.   DATA REVIEW:   CBC  Recent Labs Lab 01/27/17 0452  WBC 7.5  HGB 10.6*  HCT 32.1*  PLT 265    Chemistries   Recent Labs Lab 01/27/17 0452  NA 137  K 3.5  CL 96*  CO2 29  GLUCOSE 190*  BUN 16  CREATININE 0.89  CALCIUM 8.9    Cardiac Enzymes  Recent Labs Lab 01/26/17 1603  TROPONINI <0.03    Microbiology Results  Results for orders placed or performed during the hospital encounter of 01/26/17  Respiratory Panel by PCR     Status: Abnormal   Collection Time: 01/27/17  4:13 AM  Result Value Ref Range Status   Adenovirus NOT DETECTED NOT DETECTED Final   Coronavirus 229E NOT DETECTED NOT DETECTED Final   Coronavirus HKU1 NOT DETECTED NOT DETECTED Final   Coronavirus NL63 NOT DETECTED NOT DETECTED Final   Coronavirus OC43 NOT DETECTED NOT DETECTED Final   Metapneumovirus NOT DETECTED NOT DETECTED Final   Rhinovirus / Enterovirus DETECTED (A) NOT DETECTED Final   Influenza A NOT DETECTED NOT DETECTED Final   Influenza B NOT DETECTED NOT DETECTED Final   Parainfluenza Virus 1 NOT DETECTED NOT DETECTED Final   Parainfluenza Virus 2 NOT DETECTED NOT DETECTED Final   Parainfluenza Virus 3 NOT DETECTED NOT DETECTED Final   Parainfluenza Virus 4 NOT DETECTED NOT DETECTED Final   Respiratory Syncytial Virus NOT DETECTED NOT DETECTED Final   Bordetella pertussis NOT DETECTED NOT DETECTED Final   Chlamydophila pneumoniae NOT DETECTED NOT DETECTED Final   Mycoplasma pneumoniae NOT DETECTED  NOT DETECTED Final    Comment: Performed at [facility], [address] [place], [place] [address]      Management plans discussed with the patient, family and they are in agreement.  CODE STATUS:  Code Status History    Date Active Date Inactive Code Status Order ID Comments User Context   01/26/2017  9:00 PM 01/28/2017  3:36 PM DNR [id number]  Loletha Grayer, MD ED   07/26/2016  4:16 PM 07/28/2016  7:26 PM Full Code [id number]  Idelle Crouch, MD Inpatient   07/25/2015  5:09 PM 07/26/2015  6:01 PM  Full Code [id number]  Aldean Jewett, MD Inpatient   03/14/2015  2:33 PM 03/15/2015  6:32 PM Full Code [id number]  Isaias Cowman, MD Inpatient   07/27/2014  3:37 PM 07/31/2014 10:33 PM Full Code [id number]  Penelope Coop Inpatient   07/27/2014  5:05 AM 07/27/2014  3:37 PM Full Code [id number]  Etta Quill, DO ED    Questions for Most Recent Historical Code Status (Order [id number])    Question Answer Comment   In the event of cardiac or respiratory ARREST Do not call a "code blue"    In the event of cardiac or respiratory ARREST Do not perform Intubation, CPR, defibrillation or ACLS    In the event of cardiac or respiratory ARREST Use medication by any route, position, wound care, and other measures to relive pain and suffering. May use oxygen, suction and manual treatment of airway obstruction as needed for comfort.    Comments nurse may pronounce         Advance Directive Documentation     Most Recent Value  Type of Advance Directive  Healthcare Power of Attorney, Living will  Pre-existing out of facility DNR order (yellow form or pink MOST form)  -  "MOST" Form in Place?  -      TOTAL TIME TAKING CARE OF THIS PATIENT: 35 minutes.    Loletha Grayer M.D on 01/28/2017 at 6:22 PM  Between 7am to 6pm - Pager - [phone]  After 6pm go to [email or web address] - password [email or web address] [facility]  [phone]  CC: Primary care physician; Idelle Crouch, MD

## 2017-01-28 NOTE — Care Management (Signed)
Screen due to recommendations for home health.  Patient lives with her son, daughter in law and sometimes the adult grandchildren are in the home.  Independent in all adls, denies issues accessing medical care, obtaining medications or with transportation.  Current with her PCP- Dr Doy Hutching.  Patient declines home health. She admits to not being homebound.  No one in the home drives and she is the one that must drive everyone to work and pick them up.  She says she leaves the home without any issues.  Street address is [place], [place]. Updated attending

## 2017-01-28 NOTE — Care Management Important Message (Signed)
Important Message  Patient Details  Name: Amber [name] MRN: [id number] Date of Birth: 1930/05/29   Medicare Important Message Given:  Yes    Katrina Stack, RN 01/28/2017, 8:33 AM

## 2017-01-28 NOTE — Discharge Instructions (Signed)
Heart Failure Clinic appointment on February 09 2017 at 11:20am with Darylene Price, [place]. Please call [phone] to reschedule.

## 2017-02-06 DIAGNOSIS — E118 Type 2 diabetes mellitus with unspecified complications: Secondary | ICD-10-CM | POA: Diagnosis not present

## 2017-02-06 DIAGNOSIS — R05 Cough: Secondary | ICD-10-CM | POA: Diagnosis not present

## 2017-02-06 DIAGNOSIS — R55 Syncope and collapse: Secondary | ICD-10-CM | POA: Diagnosis not present

## 2017-02-06 DIAGNOSIS — I5022 Chronic systolic (congestive) heart failure: Secondary | ICD-10-CM | POA: Diagnosis not present

## 2017-02-06 DIAGNOSIS — Z79899 Other long term (current) drug therapy: Secondary | ICD-10-CM | POA: Diagnosis not present

## 2017-02-09 ENCOUNTER — Ambulatory Visit: Payer: Medicare HMO | Attending: Family | Admitting: Family

## 2017-02-09 ENCOUNTER — Encounter: Payer: Self-pay | Admitting: Family

## 2017-02-09 VITALS — BP 132/60 | HR 81 | Resp 20 | Ht 61.0 in | Wt 189.4 lb

## 2017-02-09 DIAGNOSIS — Z8249 Family history of ischemic heart disease and other diseases of the circulatory system: Secondary | ICD-10-CM | POA: Diagnosis not present

## 2017-02-09 DIAGNOSIS — G8929 Other chronic pain: Secondary | ICD-10-CM | POA: Insufficient documentation

## 2017-02-09 DIAGNOSIS — IMO0001 Reserved for inherently not codable concepts without codable children: Secondary | ICD-10-CM

## 2017-02-09 DIAGNOSIS — M1990 Unspecified osteoarthritis, unspecified site: Secondary | ICD-10-CM | POA: Diagnosis not present

## 2017-02-09 DIAGNOSIS — Z7901 Long term (current) use of anticoagulants: Secondary | ICD-10-CM | POA: Diagnosis not present

## 2017-02-09 DIAGNOSIS — I130 Hypertensive heart and chronic kidney disease with heart failure and stage 1 through stage 4 chronic kidney disease, or unspecified chronic kidney disease: Secondary | ICD-10-CM | POA: Diagnosis not present

## 2017-02-09 DIAGNOSIS — F419 Anxiety disorder, unspecified: Secondary | ICD-10-CM | POA: Diagnosis not present

## 2017-02-09 DIAGNOSIS — Z96641 Presence of right artificial hip joint: Secondary | ICD-10-CM | POA: Diagnosis not present

## 2017-02-09 DIAGNOSIS — Z96651 Presence of right artificial knee joint: Secondary | ICD-10-CM | POA: Insufficient documentation

## 2017-02-09 DIAGNOSIS — I5022 Chronic systolic (congestive) heart failure: Secondary | ICD-10-CM | POA: Diagnosis not present

## 2017-02-09 DIAGNOSIS — E119 Type 2 diabetes mellitus without complications: Secondary | ICD-10-CM

## 2017-02-09 DIAGNOSIS — Z7983 Long term (current) use of bisphosphonates: Secondary | ICD-10-CM | POA: Diagnosis not present

## 2017-02-09 DIAGNOSIS — Z79899 Other long term (current) drug therapy: Secondary | ICD-10-CM | POA: Insufficient documentation

## 2017-02-09 DIAGNOSIS — I10 Essential (primary) hypertension: Secondary | ICD-10-CM

## 2017-02-09 DIAGNOSIS — R55 Syncope and collapse: Secondary | ICD-10-CM | POA: Insufficient documentation

## 2017-02-09 DIAGNOSIS — I509 Heart failure, unspecified: Secondary | ICD-10-CM | POA: Diagnosis present

## 2017-02-09 DIAGNOSIS — Z888 Allergy status to other drugs, medicaments and biological substances status: Secondary | ICD-10-CM | POA: Insufficient documentation

## 2017-02-09 DIAGNOSIS — Z950 Presence of cardiac pacemaker: Secondary | ICD-10-CM | POA: Diagnosis not present

## 2017-02-09 DIAGNOSIS — N189 Chronic kidney disease, unspecified: Secondary | ICD-10-CM | POA: Insufficient documentation

## 2017-02-09 DIAGNOSIS — Z880 Allergy status to penicillin: Secondary | ICD-10-CM | POA: Diagnosis not present

## 2017-02-09 DIAGNOSIS — E1122 Type 2 diabetes mellitus with diabetic chronic kidney disease: Secondary | ICD-10-CM | POA: Insufficient documentation

## 2017-02-09 DIAGNOSIS — E785 Hyperlipidemia, unspecified: Secondary | ICD-10-CM | POA: Insufficient documentation

## 2017-02-09 DIAGNOSIS — Z8049 Family history of malignant neoplasm of other genital organs: Secondary | ICD-10-CM | POA: Diagnosis not present

## 2017-02-09 DIAGNOSIS — Z794 Long term (current) use of insulin: Secondary | ICD-10-CM | POA: Diagnosis not present

## 2017-02-09 DIAGNOSIS — I2510 Atherosclerotic heart disease of native coronary artery without angina pectoris: Secondary | ICD-10-CM | POA: Insufficient documentation

## 2017-02-09 NOTE — Progress Notes (Signed)
Patient ID: Amber [name], female    DOB: 04-Sep-1929, 83 y.o.   MRN: [id number]  HPI  Amber [name] is a 83 y/o female with a history of hyperlipidemia, HTN, DM, CAD, chronic pain, CKD, arthritis, anxiety, anemia and chronic heart failure.   Reviewed echo done 01/27/17 which showed an EF of 40-45% along with mild MR. Previous echo done 07/27/16 showed an EF of 35-40% along with moderate MR/TR. This is essentially unchanged from a previous echo done August 2017.  Admitted 01/26/17 due to asthmatic bronchitis. Started on nebulizers, steroids and antibiotics. Discharged home after 2 days with a prednisone taper. Was in the ED 01/18/17 after a fainting episode at home with right ankle injury. Diagnosed with a closed fracture of sesamoid bone in right foot. Discharged home. Came to the ED on 09/19/16 after a mechanical fall. She tripped over some carpet and fell onto her right knee. Evaluated and discharged home. Last admitted on 07/26/16 with acute heart failure exacerbation and HTN. Was treated and release after 2 days. Was in the ED on 06/06/16 for left wrist pain. Was evaluated and discharged home.   She presents today for a follow-up visit with a chief complaint of mild shortness of breath upon moderate exertion. She describes this as chronic in nature having been present for several years with varying levels of severity. She has associated fatigue, wheezing, cough and anxiety along with this. She does note that her breathing gets worse when she's feeling anxious.   Past Medical History:  Diagnosis Date  . Anemia   . Anxiety   . Arthritis   . Benign neoplasm of colon   . CHF (congestive heart failure) ([place])   . Chronic kidney disease   . Chronic pain   . Complication of anesthesia 1980's   hard time waking up  . Coronary artery disease with unspecified angina pectoris   . Cough   . Diabetes mellitus without complication ([place])   . Essential hypertension   . High risk medication use   .  Hyperlipidemia   . Plantar fascial fibromatosis   . Presence of permanent cardiac pacemaker   . Shortness of breath dyspnea    Past Surgical History:  Procedure Laterality Date  . ABDOMINAL HYSTERECTOMY    . BACK SURGERY  1960's   cage and screws in lower back  . CATARACT EXTRACTION W/ INTRAOCULAR LENS  IMPLANT, BILATERAL Bilateral   . CORONARY ARTERY BYPASS GRAFT  2008  . JOINT REPLACEMENT Right    hip and knee  . ORIF ANKLE FRACTURE Right 07/27/2014   Procedure: OPEN REDUCTION INTERNAL FIXATION (ORIF) ANKLE FRACTURE;  Surgeon: Alta Corning, MD;  Location: [facility];  Service: Orthopedics;  Laterality: Right;  . PACEMAKER INSERTION Left 03/14/2015   Procedure: INSERTION PACEMAKER;  Surgeon: Isaias Cowman, MD;  Location: [facility];  Service: Cardiovascular;  Laterality: Left;  . ROTATOR CUFF REPAIR    . TOTAL HIP ARTHROPLASTY Right   . TOTAL KNEE ARTHROPLASTY Right   . VEIN LIGATION AND STRIPPING     Family History  Problem Relation Age of Onset  . Heart attack Mother   . Heart disease Father   . Alzheimer's disease Sister   . Cervical cancer Sister   . Heart failure Son    Social History  Substance Use Topics  . Smoking status: Never Smoker  . Smokeless tobacco: Never Used  . Alcohol use No   Allergies  Allergen Reactions  . Ambien [Zolpidem Tartrate] Other (  See Comments)    Reaction:  Keeps pt awake   . Etodolac Other (See Comments)    Reaction:  Unknown   . Iodine Itching  . Nsaids Other (See Comments)    Reaction:  Unknown   . Penicillins Itching and Other (See Comments)    Has patient had a PCN reaction causing immediate rash, facial/tongue/throat swelling, SOB or lightheadedness with hypotension: No Has patient had a PCN reaction causing severe rash involving mucus membranes or skin necrosis: No Has patient had a PCN reaction that required hospitalization No Has patient had a PCN reaction occurring within the last 10 years: No If all of the above answers are  "NO", then may proceed with Cephalosporin use.  . Succinylcholine Other (See Comments)    Reaction:  Unknown    Prior to Admission medications   Medication Sig Start Date End Date Taking? Authorizing Provider  albuterol (PROVENTIL HFA;VENTOLIN HFA) 108 (90 Base) MCG/ACT inhaler Inhale 2 puffs into the lungs every 6 (six) hours as needed for wheezing or shortness of breath. 01/28/17  Yes Wieting, Richard, MD  alendronate (FOSAMAX) 70 MG tablet Take 70 mg by mouth every Thursday.    Yes [provider]  ALPRAZolam Duanne Moron) 1 MG tablet Take 1 tablet (1 mg total) by mouth at bedtime as needed for anxiety. Patient taking differently: Take 0.5 mg by mouth at bedtime as needed for anxiety.  07/31/14  Yes Elgergawy, Silver Huguenin, MD  amLODipine (NORVASC) 10 MG tablet Take 5 mg by mouth daily.    Yes [provider]  apixaban (ELIQUIS) 5 MG TABS tablet Take 5 mg by mouth 2 (two) times daily.   Yes [provider]  calcium carbonate (OSCAL) 1500 (600 Ca) MG TABS tablet Take 600 mg of elemental calcium by mouth daily with breakfast.   Yes [provider]  carvedilol (COREG) 6.25 MG tablet Take 1 tablet (6.25 mg total) by mouth 2 (two) times daily with a meal. 01/28/17  Yes Wieting, Richard, MD  furosemide (LASIX) 20 MG tablet Take 1 tablet (20 mg total) by mouth daily. 08/07/16 08/07/17 Yes Wilford Merryfield, Otila Kluver A, FNP  gabapentin (NEURONTIN) 100 MG capsule Take 200 mg by mouth 3 (three) times daily.    Yes [provider]  insulin aspart protamine- aspart (NOVOLOG MIX 70/30) (70-30) 100 UNIT/ML injection Inject 0.15 mLs (15 Units total) into the skin 2 (two) times daily with a meal. 01/28/17  Yes Wieting, Richard, MD  ipratropium-albuterol (DUONEB) 0.5-2.5 (3) MG/3ML SOLN Inhale 3 mLs into the lungs 4 (four) times daily. 02/06/17 02/01/18 Yes [provider]  Multiple Vitamins-Minerals (PRESERVISION AREDS 2) CAPS Take 1 capsule by mouth daily.   Yes [provider]   omeprazole (PRILOSEC) 20 MG capsule Take 20 mg by mouth daily.   Yes [provider]  pravastatin (PRAVACHOL) 40 MG tablet Take 40 mg by mouth at bedtime.   Yes [provider]  sacubitril-valsartan (ENTRESTO) 24-26 MG Take 1 tablet by mouth 2 (two) times daily.   Yes [provider]  traMADol (ULTRAM) 50 MG tablet Take 1 tablet (50 mg total) by mouth every 6 (six) hours as needed. 01/18/17  Yes Merlyn Lot, MD  benzonatate (TESSALON) 200 MG capsule Take 1 capsule (200 mg total) by mouth 3 (three) times daily as needed for cough. Patient not taking: Reported on 02/09/2017 01/28/17   Loletha Grayer, MD    Review of Systems  Constitutional: Positive for fatigue. Negative for appetite change.  HENT: Negative  for congestion, postnasal drip and sore throat.   Eyes: Negative.   Respiratory: Positive for cough, shortness of breath and wheezing (better). Negative for chest tightness.   Cardiovascular: Negative for chest pain, palpitations and leg swelling.  Gastrointestinal: Negative for abdominal distention and abdominal pain.  Endocrine: Negative.   Genitourinary: Negative.   Musculoskeletal: Positive for arthralgias (right knee pain & left hip pain). Negative for neck pain.  Skin: Negative.           Allergic/Immunologic: Negative.   Neurological: Negative for dizziness, light-headedness and numbness.  Hematological: Negative for adenopathy. Bruises/bleeds easily.  Psychiatric/Behavioral: Positive for sleep disturbance (didn't sleep well last night due to stress). Negative for dysphoric mood and suicidal ideas. The patient is nervous/anxious.    Vitals:   02/09/17 1133  BP: 132/60  Pulse: 81  Resp: 20  SpO2: 95%  Weight: 189 lb 6 oz (85.9 kg)  Height: 5\' 1"  (1.549 m)   Wt Readings from Last 3 Encounters:  02/09/17 189 lb 6 oz (85.9 kg)  01/27/17 186 lb 14.4 oz (84.8 kg)  01/18/17 180 lb (81.6 kg)    Lab Results  Component Value Date   CREATININE  0.89 01/27/2017   CREATININE 0.96 01/26/2017   CREATININE 1.02 (H) 01/18/2017    Physical Exam  Constitutional: She is oriented to person, place, and time. She appears well-developed and well-nourished.  HENT:  Head: Normocephalic and atraumatic.  Neck: Normal range of motion. Neck supple. No JVD present.  Cardiovascular: Normal rate and regular rhythm.   Pulmonary/Chest: Effort normal. She has no wheezes. She has no rales.  Abdominal: Soft. She exhibits no distension. There is no tenderness.  Musculoskeletal: She exhibits no edema or tenderness.  Neurological: She is alert and oriented to person, place, and time.  Skin: Skin is warm and dry.  Psychiatric: She has a normal mood and affect. Her behavior is normal. Thought content normal.  Nursing note and vitals reviewed.   Assessment & Plan:  1: Chronic heart failure with reduced ejection fraction- - NYHA class II - euvolemic - continue weighing daily. Reminded to call for an overnight weight gain of >2 pounds or a weekly weight gain of >5 pounds. Weight up 5 pounds since she was last here although she denies any overnight weight gains per above parameter - not adding salt to her food and is trying to eat low sodium foods although last night she ate brunswick stew and crackers - tolerating entresto without known side effects; discussed increasing it in the future depending on what the origin of her syncopal events are - saw her cardiologist Nehemiah Massed) 01/01/17 and returns 02/10/17 - BMP from 02/06/17 reviewed and shows sodium 139, potassium 4.3 and GFR 53 - using nebulizer QID   2: HTN- - BP looks good today - continue medications at this time - saw PCP Doy Hutching) 02/06/17 and returns 03/20/17  3: Diabetes- - glucose has been running in the 200's - A1c from 02/06/17 was 8.5% - she adjusts her insulin herself - follows with PCP about this  4: Anxiety- - admits to being under a lot of family stress with financial issues related to  deaths in her family - does take 0.5mg  alprazolam at bedtime on occasion so that she can settle her mind down to sleep - said she had difficulty sleeping last night due to the stress - denies any suicidal thoughts  5: Syncope- - had another syncopal event a few weeks ago which led to a broken bone  on the top of her right foot - had another event a few days ago; denies any warning symptoms - has been told not to drive - says that PCP is scheduling a neurologist appointment  Patient did not bring her medications nor a list. Each medication was verbally reviewed with the patient and she was encouraged to bring the bottles to every visit to confirm accuracy of list.  Return in 3 months or sooner for any questions/problems before next office visit.

## 2017-02-09 NOTE — Patient Instructions (Signed)
Continue weighing daily and call for an overnight weight gain of > 2 pounds or a weekly weight gain of >5 pounds. 

## 2017-02-10 DIAGNOSIS — I10 Essential (primary) hypertension: Secondary | ICD-10-CM | POA: Diagnosis not present

## 2017-02-10 DIAGNOSIS — I5023 Acute on chronic systolic (congestive) heart failure: Secondary | ICD-10-CM | POA: Diagnosis not present

## 2017-02-10 DIAGNOSIS — I25810 Atherosclerosis of coronary artery bypass graft(s) without angina pectoris: Secondary | ICD-10-CM | POA: Diagnosis not present

## 2017-02-10 DIAGNOSIS — R748 Abnormal levels of other serum enzymes: Secondary | ICD-10-CM | POA: Diagnosis not present

## 2017-02-26 DIAGNOSIS — J4521 Mild intermittent asthma with (acute) exacerbation: Secondary | ICD-10-CM | POA: Diagnosis not present

## 2017-02-26 DIAGNOSIS — R05 Cough: Secondary | ICD-10-CM | POA: Diagnosis not present

## 2017-03-02 ENCOUNTER — Ambulatory Visit: Payer: Medicare HMO | Admitting: Family

## 2017-03-07 ENCOUNTER — Emergency Department: Payer: Medicare HMO

## 2017-03-07 ENCOUNTER — Emergency Department
Admission: EM | Admit: 2017-03-07 | Discharge: 2017-03-07 | Disposition: A | Discharge status: Home / Self Care | Payer: Medicare HMO | Attending: Emergency Medicine | Admitting: Emergency Medicine

## 2017-03-07 ENCOUNTER — Encounter: Payer: Self-pay | Admitting: Emergency Medicine

## 2017-03-07 DIAGNOSIS — Z79899 Other long term (current) drug therapy: Secondary | ICD-10-CM | POA: Insufficient documentation

## 2017-03-07 DIAGNOSIS — I130 Hypertensive heart and chronic kidney disease with heart failure and stage 1 through stage 4 chronic kidney disease, or unspecified chronic kidney disease: Secondary | ICD-10-CM | POA: Insufficient documentation

## 2017-03-07 DIAGNOSIS — M549 Dorsalgia, unspecified: Secondary | ICD-10-CM | POA: Insufficient documentation

## 2017-03-07 DIAGNOSIS — G8929 Other chronic pain: Secondary | ICD-10-CM | POA: Insufficient documentation

## 2017-03-07 DIAGNOSIS — I110 Hypertensive heart disease with heart failure: Secondary | ICD-10-CM | POA: Diagnosis not present

## 2017-03-07 DIAGNOSIS — I509 Heart failure, unspecified: Secondary | ICD-10-CM | POA: Diagnosis not present

## 2017-03-07 DIAGNOSIS — N189 Chronic kidney disease, unspecified: Secondary | ICD-10-CM | POA: Insufficient documentation

## 2017-03-07 DIAGNOSIS — I2510 Atherosclerotic heart disease of native coronary artery without angina pectoris: Secondary | ICD-10-CM | POA: Insufficient documentation

## 2017-03-07 DIAGNOSIS — R109 Unspecified abdominal pain: Secondary | ICD-10-CM | POA: Diagnosis not present

## 2017-03-07 DIAGNOSIS — Z7901 Long term (current) use of anticoagulants: Secondary | ICD-10-CM | POA: Insufficient documentation

## 2017-03-07 DIAGNOSIS — E1122 Type 2 diabetes mellitus with diabetic chronic kidney disease: Secondary | ICD-10-CM | POA: Insufficient documentation

## 2017-03-07 DIAGNOSIS — Z794 Long term (current) use of insulin: Secondary | ICD-10-CM | POA: Diagnosis not present

## 2017-03-07 DIAGNOSIS — M545 Low back pain: Secondary | ICD-10-CM | POA: Diagnosis not present

## 2017-03-07 LAB — URINALYSIS, ROUTINE W REFLEX MICROSCOPIC
Bacteria, UA: NONE SEEN
Bilirubin Urine: NEGATIVE
Glucose, UA: NEGATIVE mg/dL
Ketones, ur: NEGATIVE mg/dL
Leukocytes, UA: NEGATIVE
Nitrite: NEGATIVE
Protein, ur: NEGATIVE mg/dL
Specific Gravity, Urine: 1.005 (ref 1.005–1.030)
Squamous Epithelial / HPF: NONE SEEN
WBC, UA: NONE SEEN WBC/hpf (ref 0–5)
pH: 5 (ref 5.0–8.0)

## 2017-03-07 MED ORDER — CYCLOBENZAPRINE HCL 10 MG PO TABS
10.0000 mg | ORAL_TABLET | Freq: Once | ORAL | Status: AC
  Administered 2017-03-07: 10 mg via ORAL
  Filled 2017-03-07: qty 1

## 2017-03-07 MED ORDER — TRAMADOL-ACETAMINOPHEN 37.5-325 MG PO TABS: 1.0000 | ORAL_TABLET | Freq: Two times a day (BID) | ORAL | 0 refills | Status: DC

## 2017-03-07 NOTE — ED Notes (Signed)
FIRST NURSE NOTE:  Pt arrived via POV from home c/o bilateral low back pain, pt states she thinks it is her kidneys.  Pt assisted to bathroom immediately on arrival, urine sample obtain, clear yellow no odor.

## 2017-03-07 NOTE — ED Provider Notes (Signed)
[facility] Emergency Department Provider Note   ____________________________________________   None    (approximate)  I have reviewed the triage vital signs and the nursing notes.   HISTORY  Chief Complaint Back Pain    HPI Amber [name] is a 83 y.o. female patient complain of bilateral flank pain which began yesterday. Patient state pain is worse with movement and deep inspiration.atient denies urinary complaints. Patient denies radicular component to her pain. Patient stated mild transient relief taking extra strength Tylenol.Patient has history of chronic back pain which surgical corrections consisting of internal fixations. She currently rates the pain as a 3/10. Patient pain increases to a 7/10 with movements or deep inspirations.   Past Medical History:  Diagnosis Date  . Anemia   . Anxiety   . Arthritis   . Benign neoplasm of colon   . CHF (congestive heart failure) ([place])   . Chronic kidney disease   . Chronic pain   . Complication of anesthesia 1980's   hard time waking up  . Coronary artery disease with unspecified angina pectoris   . Cough   . Diabetes mellitus without complication ([place])   . Essential hypertension   . High risk medication use   . Hyperlipidemia   . Plantar fascial fibromatosis   . Presence of permanent cardiac pacemaker   . Shortness of breath dyspnea     Patient Active Problem List   Diagnosis Date Noted  . Syncope 02/09/2017  . Asthmatic bronchitis 01/26/2017  . Fall 10/06/2016  . Anxiety 08/08/2016  . TIA (transient ischemic attack) 07/25/2015  . Sick sinus syndrome ([place]) 03/14/2015  . Bradycardia 02/16/2015  . Chronic heart failure ([place]) 01/05/2015  . Fracture dislocation of ankle 07/27/2014  . IDDM (insulin dependent diabetes mellitus) ([place]) 07/27/2014  . Essential hypertension 07/27/2014  . Multiple rib fractures 07/27/2014  . 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    Past Surgical History:    Procedure Laterality Date  . ABDOMINAL HYSTERECTOMY    . BACK SURGERY  1960's   cage and screws in lower back  . CATARACT EXTRACTION W/ INTRAOCULAR LENS  IMPLANT, BILATERAL Bilateral   . CORONARY ARTERY BYPASS GRAFT  2008  . JOINT REPLACEMENT Right    hip and knee  . ORIF ANKLE FRACTURE Right 07/27/2014   Procedure: OPEN REDUCTION INTERNAL FIXATION (ORIF) ANKLE FRACTURE;  Surgeon: Alta Corning, MD;  Location: [facility];  Service: Orthopedics;  Laterality: Right;  . PACEMAKER INSERTION Left 03/14/2015   Procedure: INSERTION PACEMAKER;  Surgeon: Isaias Cowman, MD;  Location: [facility];  Service: Cardiovascular;  Laterality: Left;  . ROTATOR CUFF REPAIR    . TOTAL HIP ARTHROPLASTY Right   . TOTAL KNEE ARTHROPLASTY Right   . VEIN LIGATION AND STRIPPING      Prior to Admission medications   Medication Sig Start Date End Date Taking? Authorizing Provider  albuterol (PROVENTIL HFA;VENTOLIN HFA) 108 (90 Base) MCG/ACT inhaler Inhale 2 puffs into the lungs every 6 (six) hours as needed for wheezing or shortness of breath. 01/28/17   Loletha Grayer, MD  alendronate (FOSAMAX) 70 MG tablet Take 70 mg by mouth every Thursday.     [provider]  ALPRAZolam Duanne Moron) 1 MG tablet Take 1 tablet (1 mg total) by mouth at bedtime as needed for anxiety. Patient taking differently: Take 0.5 mg by mouth at bedtime as needed for anxiety.  07/31/14   Elgergawy, Silver Huguenin, MD  amLODipine (NORVASC) 10 MG tablet Take 5  mg by mouth daily.     [provider]  apixaban (ELIQUIS) 5 MG TABS tablet Take 5 mg by mouth 2 (two) times daily.    [provider]  calcium carbonate (OSCAL) 1500 (600 Ca) MG TABS tablet Take 600 mg of elemental calcium by mouth daily with breakfast.    [provider]  carvedilol (COREG) 6.25 MG tablet Take 1 tablet (6.25 mg total) by mouth 2 (two) times daily with a meal. 01/28/17   Leslye Peer, Richard, MD  furosemide (LASIX) 20 MG tablet Take 1 tablet (20 mg  total) by mouth daily. 08/07/16 08/07/17  Alisa Graff, FNP  gabapentin (NEURONTIN) 100 MG capsule Take 200 mg by mouth 3 (three) times daily.     [provider]  insulin aspart protamine- aspart (NOVOLOG MIX 70/30) (70-30) 100 UNIT/ML injection Inject 0.15 mLs (15 Units total) into the skin 2 (two) times daily with a meal. 01/28/17   Leslye Peer, Richard, MD  ipratropium-albuterol (DUONEB) 0.5-2.5 (3) MG/3ML SOLN Inhale 3 mLs into the lungs 4 (four) times daily. 02/06/17 02/01/18  [provider]  Multiple Vitamins-Minerals (PRESERVISION AREDS 2) CAPS Take 1 capsule by mouth daily.    [provider]  omeprazole (PRILOSEC) 20 MG capsule Take 20 mg by mouth daily.    [provider]  pravastatin (PRAVACHOL) 40 MG tablet Take 40 mg by mouth at bedtime.    [provider]  sacubitril-valsartan (ENTRESTO) 24-26 MG Take 1 tablet by mouth 2 (two) times daily.    [provider]  traMADol (ULTRAM) 50 MG tablet Take 1 tablet (50 mg total) by mouth every 6 (six) hours as needed. 01/18/17   Merlyn Lot, MD  traMADol-acetaminophen (ULTRACET) 37.5-325 MG tablet Take 1 tablet by mouth 2 (two) times daily. 03/07/17   Sable Feil, PA-C    Allergies Ambien [zolpidem tartrate]; Etodolac; Iodine; Nsaids; Penicillins; and Succinylcholine  Family History  Problem Relation Age of Onset  . Heart attack Mother   . Heart disease Father   . Alzheimer's disease Sister   . Cervical cancer Sister   . Heart failure Son     Social History Social History  Substance Use Topics  . Smoking status: Never Smoker  . Smokeless tobacco: Never Used  . Alcohol use No    Review of Systems Constitutional: No fever/chills Eyes: No visual changes. ENT: No sore throat. Cardiovascular: Denies chest pain. Respiratory: Denies shortness of breath. Gastrointestinal: No abdominal pain.  No nausea, no vomiting.  No diarrhea.  No constipation. Genitourinary: Negative for  dysuria. Musculoskeletal: bilateral back pain Skin: Negative for rash. Neurological: Negative for headaches, focal weakness or numbness. Psychiatric:Anxiety Endocrine:diabetes, hypertension, nd hyperlipidemia Hematological/Lymphatic: Allergic/Immunilogical: see medication list ____________________________________________   PHYSICAL EXAM:  VITAL SIGNS: ED Triage Vitals  Enc Vitals Group     BP 03/07/17 1236 (!) 135/56     Pulse Rate 03/07/17 1236 64     Resp 03/07/17 1236 18     Temp 03/07/17 1236 98.2 F (36.8 C)     Temp Source 03/07/17 1236 Oral     SpO2 03/07/17 1236 100 %     Weight 03/07/17 1237 189 lb (85.7 kg)     Height --      Head Circumference --      Peak Flow --      Pain Score 03/07/17 1236 3     Pain Loc --      Pain Edu? --      Excl. in  GC? --     Constitutional: Alert and oriented. Well appearing and in no acute distress. Neck: No stridor.  No cervical spine tenderness to palpation. Cardiovascular: Normal rate, regular rhythm. Grossly normal heart sounds.  Good peripheral circulation. Respiratory: Normal respiratory effort.  No retractions. Lungs CTAB. Gastrointestinal: Soft and nontender. No distention. No abdominal bruits. No CVA tenderness. Genitourinary: deferred Musculoskeletal: no obvious cervical or lumbar deformity. Patient is  Guarding with palpation L1-L5. No lower extremity tenderness nor edema.  No joint effusions. Neurologic:  Normal speech and language. No gross focal neurologic deficits are appreciated. No gait instability. Skin:  Skin is warm, dry and intact. No rash noted. Psychiatric: Mood and affect are normal. Speech and behavior are normal.  ____________________________________________   LABS (all labs ordered are listed, but only abnormal results are displayed)  Labs Reviewed  URINALYSIS, ROUTINE W REFLEX MICROSCOPIC - Abnormal; Notable for the following:       Result Value   Color, Urine STRAW (*)    APPearance CLEAR (*)     Hgb urine dipstick SMALL (*)    All other components within normal limits   ____________________________________________  EKG   ____________________________________________  RADIOLOGY  Dg Lumbar Spine Complete  Result Date: 03/07/2017 CLINICAL DATA:  83 year old with acute onset of low back pain when she awakened 2 days ago, without radiculopathy. Prior lumbar fusion 40+ years ago. No known injury. EXAM: LUMBAR SPINE - COMPLETE 4+ VIEW COMPARISON:  Bone window images from CT chest abdomen and pelvis 07/27/2014 FINDINGS: Five non-rib-bearing lumbar vertebrae with anatomic alignment. No fractures. Prior L2-3 PLIF and interbody fusion with ray cages. Hardware intact and the cages appropriately positioned in the disc space without subsidence. Severe disc space narrowing with associated endplate hypertrophic changes at L1-2 and L3-4. Mild disc space narrowing at L4-5 with vacuum phenomenon. Lower thoracic degenerative disc disease and spondylosis. Facet degenerative changes at L3-4, L4-5 and L5-S1. Sacroiliac joints intact. Note made of old healed fractures involving the right posterior tenth, eleventh and twelfth ribs as noted on the prior CT. IMPRESSION: 1. No acute osseous abnormality. 2. Severe degenerative disc disease at L1-2 and L3-4. Mild degenerative disc disease at L4-5. 3. Prior L2-3 PLIF and interbody fusion with ray cages. No complicating features. 4. Facet degenerative changes at L3-4, L4-5 and L5-S1. Electronically Signed   By: Evangeline Dakin M.D.   On: 03/07/2017 13:43    ____________________________________________   PROCEDURES  Procedure(s) performed: None  Procedures  Critical Care performed: No  ____________________________________________   INITIAL IMPRESSION / ASSESSMENT AND PLAN / ED COURSE  Pertinent labs & imaging results that were available during my care of the patient were reviewed by me and considered in my medical decision making (see chart for  details).  Patient presented bilateral back pain with no provocative incident. Patient's urinalysis unremarkable.X-ray of the back reveal moderate/severe degenerative changes of the lumbar spine. Internal fixations all stable. Discussed these findings with patient. Patient given discharge care instructions. Patient advised follow-up with PCP.      ____________________________________________   FINAL CLINICAL IMPRESSION(S) / ED DIAGNOSES  Final diagnoses:  Bilateral flank pain      NEW MEDICATIONS STARTED DURING THIS VISIT:  Discharge Medication List as of 03/07/2017  2:19 PM    START taking these medications   Details  traMADol-acetaminophen (ULTRACET) 37.5-325 MG tablet Take 1 tablet by mouth 2 (two) times daily., Starting Sat 03/07/2017, Print         Note:  This document was prepared  using Systems analyst and may include unintentional dictation errors.    Sable Feil, PA-C 03/07/17 1427    Lavonia Drafts, MD 03/07/17 [phone]

## 2017-03-07 NOTE — ED Triage Notes (Signed)
Pt to ed with c/o lower back pain bilaterally that started yesterday.  Pt states worse with movement and deep breath.  Reports started yesterday when she awoke.

## 2017-03-16 DIAGNOSIS — I5023 Acute on chronic systolic (congestive) heart failure: Secondary | ICD-10-CM | POA: Diagnosis not present

## 2017-03-16 DIAGNOSIS — I071 Rheumatic tricuspid insufficiency: Secondary | ICD-10-CM | POA: Diagnosis not present

## 2017-03-16 DIAGNOSIS — I340 Nonrheumatic mitral (valve) insufficiency: Secondary | ICD-10-CM | POA: Diagnosis not present

## 2017-03-16 DIAGNOSIS — I495 Sick sinus syndrome: Secondary | ICD-10-CM | POA: Diagnosis not present

## 2017-03-16 DIAGNOSIS — I25810 Atherosclerosis of coronary artery bypass graft(s) without angina pectoris: Secondary | ICD-10-CM | POA: Diagnosis not present

## 2017-03-16 DIAGNOSIS — I5022 Chronic systolic (congestive) heart failure: Secondary | ICD-10-CM | POA: Diagnosis not present

## 2017-03-16 DIAGNOSIS — G451 Carotid artery syndrome (hemispheric): Secondary | ICD-10-CM | POA: Diagnosis not present

## 2017-03-16 DIAGNOSIS — I10 Essential (primary) hypertension: Secondary | ICD-10-CM | POA: Diagnosis not present

## 2017-03-16 DIAGNOSIS — R55 Syncope and collapse: Secondary | ICD-10-CM | POA: Diagnosis not present

## 2017-03-18 DIAGNOSIS — R55 Syncope and collapse: Secondary | ICD-10-CM | POA: Diagnosis not present

## 2017-03-18 DIAGNOSIS — I10 Essential (primary) hypertension: Secondary | ICD-10-CM | POA: Diagnosis not present

## 2017-03-18 DIAGNOSIS — G894 Chronic pain syndrome: Secondary | ICD-10-CM | POA: Diagnosis not present

## 2017-03-18 DIAGNOSIS — Z794 Long term (current) use of insulin: Secondary | ICD-10-CM | POA: Diagnosis not present

## 2017-03-18 DIAGNOSIS — M5137 Other intervertebral disc degeneration, lumbosacral region: Secondary | ICD-10-CM | POA: Diagnosis not present

## 2017-03-18 DIAGNOSIS — E119 Type 2 diabetes mellitus without complications: Secondary | ICD-10-CM | POA: Diagnosis not present

## 2017-03-25 DIAGNOSIS — R05 Cough: Secondary | ICD-10-CM | POA: Diagnosis not present

## 2017-03-25 DIAGNOSIS — F99 Mental disorder, not otherwise specified: Secondary | ICD-10-CM | POA: Diagnosis not present

## 2017-03-26 DIAGNOSIS — I25810 Atherosclerosis of coronary artery bypass graft(s) without angina pectoris: Secondary | ICD-10-CM | POA: Diagnosis not present

## 2017-03-30 ENCOUNTER — Ambulatory Visit: Payer: Medicare HMO | Admitting: Nurse Practitioner

## 2017-04-08 ENCOUNTER — Encounter: Payer: Self-pay | Admitting: Nurse Practitioner

## 2017-04-08 ENCOUNTER — Ambulatory Visit: Payer: Medicare HMO | Attending: Nurse Practitioner | Admitting: Nurse Practitioner

## 2017-04-08 VITALS — BP 138/58 | HR 65 | Temp 98.0°F | Resp 16 | Ht 59.0 in | Wt 180.0 lb

## 2017-04-08 DIAGNOSIS — I5023 Acute on chronic systolic (congestive) heart failure: Secondary | ICD-10-CM | POA: Insufficient documentation

## 2017-04-08 DIAGNOSIS — R1032 Left lower quadrant pain: Secondary | ICD-10-CM

## 2017-04-08 DIAGNOSIS — G894 Chronic pain syndrome: Secondary | ICD-10-CM | POA: Diagnosis not present

## 2017-04-08 DIAGNOSIS — M79604 Pain in right leg: Secondary | ICD-10-CM

## 2017-04-08 DIAGNOSIS — M533 Sacrococcygeal disorders, not elsewhere classified: Secondary | ICD-10-CM

## 2017-04-08 DIAGNOSIS — I252 Old myocardial infarction: Secondary | ICD-10-CM | POA: Diagnosis not present

## 2017-04-08 DIAGNOSIS — Z79899 Other long term (current) drug therapy: Secondary | ICD-10-CM

## 2017-04-08 DIAGNOSIS — Z951 Presence of aortocoronary bypass graft: Secondary | ICD-10-CM | POA: Insufficient documentation

## 2017-04-08 DIAGNOSIS — Z888 Allergy status to other drugs, medicaments and biological substances status: Secondary | ICD-10-CM | POA: Insufficient documentation

## 2017-04-08 DIAGNOSIS — M545 Low back pain, unspecified: Secondary | ICD-10-CM | POA: Insufficient documentation

## 2017-04-08 DIAGNOSIS — M899 Disorder of bone, unspecified: Secondary | ICD-10-CM

## 2017-04-08 DIAGNOSIS — I25110 Atherosclerotic heart disease of native coronary artery with unstable angina pectoris: Secondary | ICD-10-CM | POA: Insufficient documentation

## 2017-04-08 DIAGNOSIS — M79605 Pain in left leg: Secondary | ICD-10-CM

## 2017-04-08 DIAGNOSIS — Z9842 Cataract extraction status, left eye: Secondary | ICD-10-CM | POA: Insufficient documentation

## 2017-04-08 DIAGNOSIS — Z9889 Other specified postprocedural states: Secondary | ICD-10-CM | POA: Insufficient documentation

## 2017-04-08 DIAGNOSIS — Z79891 Long term (current) use of opiate analgesic: Secondary | ICD-10-CM

## 2017-04-08 DIAGNOSIS — Z789 Other specified health status: Secondary | ICD-10-CM | POA: Diagnosis not present

## 2017-04-08 DIAGNOSIS — N189 Chronic kidney disease, unspecified: Secondary | ICD-10-CM | POA: Insufficient documentation

## 2017-04-08 DIAGNOSIS — Z880 Allergy status to penicillin: Secondary | ICD-10-CM | POA: Diagnosis not present

## 2017-04-08 DIAGNOSIS — G8929 Other chronic pain: Secondary | ICD-10-CM | POA: Diagnosis not present

## 2017-04-08 DIAGNOSIS — Z90710 Acquired absence of both cervix and uterus: Secondary | ICD-10-CM | POA: Insufficient documentation

## 2017-04-08 DIAGNOSIS — M722 Plantar fascial fibromatosis: Secondary | ICD-10-CM | POA: Diagnosis not present

## 2017-04-08 DIAGNOSIS — E1122 Type 2 diabetes mellitus with diabetic chronic kidney disease: Secondary | ICD-10-CM | POA: Diagnosis not present

## 2017-04-08 DIAGNOSIS — Z96661 Presence of right artificial ankle joint: Secondary | ICD-10-CM | POA: Insufficient documentation

## 2017-04-08 DIAGNOSIS — Z8049 Family history of malignant neoplasm of other genital organs: Secondary | ICD-10-CM | POA: Insufficient documentation

## 2017-04-08 DIAGNOSIS — Z950 Presence of cardiac pacemaker: Secondary | ICD-10-CM | POA: Diagnosis present

## 2017-04-08 DIAGNOSIS — Z87828 Personal history of other (healed) physical injury and trauma: Secondary | ICD-10-CM | POA: Insufficient documentation

## 2017-04-08 DIAGNOSIS — R1030 Lower abdominal pain, unspecified: Secondary | ICD-10-CM

## 2017-04-08 DIAGNOSIS — Z96651 Presence of right artificial knee joint: Secondary | ICD-10-CM | POA: Insufficient documentation

## 2017-04-08 DIAGNOSIS — M542 Cervicalgia: Secondary | ICD-10-CM

## 2017-04-08 DIAGNOSIS — Z96641 Presence of right artificial hip joint: Secondary | ICD-10-CM | POA: Insufficient documentation

## 2017-04-08 DIAGNOSIS — M1990 Unspecified osteoarthritis, unspecified site: Secondary | ICD-10-CM | POA: Insufficient documentation

## 2017-04-08 DIAGNOSIS — Z886 Allergy status to analgesic agent status: Secondary | ICD-10-CM | POA: Insufficient documentation

## 2017-04-08 DIAGNOSIS — I130 Hypertensive heart and chronic kidney disease with heart failure and stage 1 through stage 4 chronic kidney disease, or unspecified chronic kidney disease: Secondary | ICD-10-CM | POA: Diagnosis not present

## 2017-04-08 DIAGNOSIS — E785 Hyperlipidemia, unspecified: Secondary | ICD-10-CM | POA: Diagnosis not present

## 2017-04-08 DIAGNOSIS — Z9841 Cataract extraction status, right eye: Secondary | ICD-10-CM | POA: Insufficient documentation

## 2017-04-08 DIAGNOSIS — Z8249 Family history of ischemic heart disease and other diseases of the circulatory system: Secondary | ICD-10-CM | POA: Insufficient documentation

## 2017-04-08 NOTE — Progress Notes (Signed)
Safety precautions to be maintained throughout the outpatient stay will include: orient to surroundings, keep bed in low position, maintain call bell within reach at all times, provide assistance with transfer out of bed and ambulation.   Patient reports having periods of passing out which the neurologist has dx as seizures. She also states she has CHF which causes her to cough.   Patient has had 3 bypasses and currently has a pacemaker placed by Dr Saralyn Pilar.  Dr Nehemiah Massed is her current cardiologist.  Patient has had 2 hospital admissions recently 1.  Bronchitis LOS 3 days 2.  Back pain.
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
## 2017-04-08 NOTE — Patient Instructions (Addendum)
____________________________________________________________________________________________  Appointment Policy Summary  It is our goal and responsibility to provide the medical community with assistance in the evaluation and management of patients with chronic pain. Unfortunately our resources are limited. Because we do not have an unlimited amount of time, or available appointments, we are required to closely monitor and manage their use. The following rules exist to maximize their use:  Patient's responsibilities: 1. Punctuality:  At what time should I arrive? You should be physically present in our office 30 minutes before your scheduled appointment. Your scheduled appointment is with your assigned healthcare provider. However, it takes 5-10 minutes to be "checked-in", and another 15 minutes for the nurses to do the admission. If you arrive to our office at the time you were given for your appointment, you will end up being at least 20-25 minutes late to your appointment with the provider. 2. Tardiness:  What happens if I arrive only a few minutes after my scheduled appointment time? You will need to reschedule your appointment. The cutoff is your appointment time. This is why it is so important that you arrive at least 30 minutes before that appointment. If you have an appointment scheduled for 10:00 AM and you arrive at 10:01, you will be required to reschedule your appointment.  3. Plan ahead:  Always assume that you will encounter traffic on your way in. Plan for it. If you are dependent on a driver, make sure they understand these rules and the need to arrive early. 4. Other appointments and responsibilities:  Avoid scheduling any other appointments before or after your pain clinic appointments.  5. Be prepared:  Write down everything that you need to discuss with your healthcare provider and give this information to the admitting nurse. Write down the medications that you will need  refilled. Bring your pills and bottles (even the empty ones), to all of your appointments, except for those where a procedure is scheduled. 6. No children or pets:  Find someone to take care of them. It is not appropriate to bring them in. 7. Scheduling changes:  We request "advanced notification" of any changes or cancellations. 8. Advanced notification:  Defined as a time period of more than 24 hours prior to the originally scheduled appointment. This allows for the appointment to be offered to other patients. 9. Rescheduling:  When a visit is rescheduled, it will require the cancellation of the original appointment. For this reason they both fall within the category of "Cancellations".  10. Cancellations:  They require advanced notification. Any cancellation less than 24 hours before the  appointment will be recorded as a "No Show". 11. No Show:  Defined as an unkept appointment where the patient failed to notify or declare to the practice their intention or inability to keep the appointment.  Corrective process for repeat offenders:  1. Tardiness: Three (3) episodes of rescheduling due to late arrivals will be recorded as one (1) "No Show". 2. Cancellation or reschedule: Three (3) cancellations or rescheduling will be recorded as one (1) "No Show". 3. "No Shows": Three (3) "No Shows" within a 12 month period will result in discharge from the practice.  ____________________________________________________________________________________________  ____________________________________________________________________________________________  Pain Scale  Introduction: The pain score used by this practice is the Verbal Numerical Rating Scale (VNRS-11). This is an 11-point scale. It is for adults and children 10 years or older. There are significant differences in how the pain score is reported, used, and applied. Forget everything you learned in the past and  learn this scoring  system.  General Information: The scale should reflect your current level of pain. Unless you are specifically asked for the level of your worst pain, or your average pain. If you are asked for one of these two, then it should be understood that it is over the past 24 hours.  Basic Activities of Daily Living (ADL): Personal hygiene, dressing, eating, transferring, and using restroom.  Instructions: Most patients tend to report their level of pain as a combination of two factors, their physical pain and their psychosocial pain. This last one is also known as "suffering" and it is reflection of how physical pain affects you socially and psychologically. From now on, report them separately. From this point on, when asked to report your pain level, report only your physical pain. Use the following table for reference.  Pain Clinic Pain Levels (0-5/10)  Pain Level Score  Description  No Pain 0   Mild pain 1 Nagging, annoying, but does not interfere with basic activities of daily living (ADL). Patients are able to eat, bathe, get dressed, toileting (being able to get on and off the toilet and perform personal hygiene functions), transfer (move in and out of bed or a chair without assistance), and maintain continence (able to control bladder and bowel functions). Blood pressure and heart rate are unaffected. A normal heart rate for a healthy adult ranges from 60 to 100 bpm (beats per minute).   Mild to moderate pain 2 Noticeable and distracting. Impossible to hide from other people. More frequent flare-ups. Still possible to adapt and function close to normal. It can be very annoying and may have occasional stronger flare-ups. With discipline, patients may get used to it and adapt.   Moderate pain 3 Interferes significantly with activities of daily living (ADL). It becomes difficult to feed, bathe, get dressed, get on and off the toilet or to perform personal hygiene functions. Difficult to get in and out of  bed or a chair without assistance. Very distracting. With effort, it can be ignored when deeply involved in activities.   Moderately severe pain 4 Impossible to ignore for more than a few minutes. With effort, patients may still be able to manage work or participate in some social activities. Very difficult to concentrate. Signs of autonomic nervous system discharge are evident: dilated pupils (mydriasis); mild sweating (diaphoresis); sleep interference. Heart rate becomes elevated (>115 bpm). Diastolic blood pressure (lower number) rises above 100 mmHg. Patients find relief in laying down and not moving.   Severe pain 5 Intense and extremely unpleasant. Associated with frowning face and frequent crying. Pain overwhelms the senses.  Ability to do any activity or maintain social relationships becomes significantly limited. Conversation becomes difficult. Pacing back and forth is common, as getting into a comfortable position is nearly impossible. Pain wakes you up from deep sleep. Physical signs will be obvious: pupillary dilation; increased sweating; goosebumps; brisk reflexes; cold, clammy hands and feet; nausea, vomiting or dry heaves; loss of appetite; significant sleep disturbance with inability to fall asleep or to remain asleep. When persistent, significant weight loss is observed due to the complete loss of appetite and sleep deprivation.  Blood pressure and heart rate becomes significantly elevated. Caution: If elevated blood pressure triggers a pounding headache associated with blurred vision, then the patient should immediately seek attention at an urgent or emergency care unit, as these may be signs of an impending stroke.    Emergency Department Pain Levels (6-10/10)  Emergency Room Pain 6   Severely limiting. Requires emergency care and should not be seen or managed at an outpatient pain management facility. Communication becomes difficult and requires great effort. Assistance to reach the  emergency department may be required. Facial flushing and profuse sweating along with potentially dangerous increases in heart rate and blood pressure will be evident.   Distressing pain 7 Self-care is very difficult. Assistance is required to transport, or use restroom. Assistance to reach the emergency department will be required. Tasks requiring coordination, such as bathing and getting dressed become very difficult.   Disabling pain 8 Self-care is no longer possible. At this level, pain is disabling. The individual is unable to do even the most "basic" activities such as walking, eating, bathing, dressing, transferring to a bed, or toileting. Fine motor skills are lost. It is difficult to think clearly.   Incapacitating pain 9 Pain becomes incapacitating. Thought processing is no longer possible. Difficult to remember your own name. Control of movement and coordination are lost.   The worst pain imaginable 10 At this level, most patients pass out from pain. When this level is reached, collapse of the autonomic nervous system occurs, leading to a sudden drop in blood pressure and heart rate. This in turn results in a temporary and dramatic drop in blood flow to the brain, leading to a loss of consciousness. Fainting is one of the body's self defense mechanisms. Passing out puts the brain in a calmed state and causes it to shut down for a while, in order to begin the healing process.    Summary: 1. Refer to this scale when providing [place] with your pain level. 2. Be accurate and careful when reporting your pain level. This will help with your care. 3. Over-reporting your pain level will lead to loss of credibility. 4. Even a level of 1/10 means that there is pain and will be treated at our facility. 5. High, inaccurate reporting will be documented as "Symptom Exaggeration", leading to loss of credibility and suspicions of possible secondary gains such as obtaining more narcotics, or wanting to appear  disabled, for fraudulent reasons. 6. Only pain levels of 5 or below will be seen at our facility. 7. Pain levels of 6 and above will be sent to the Emergency Department and the appointment cancelled. ____________________________________________________________________________________________   BMI Assessment: Estimated body mass index is 36.36 kg/m as calculated from the following:   Height as of this encounter: 4\' 11"  (1.499 m).   Weight as of this encounter: 180 lb (81.6 kg).  BMI interpretation table: BMI level Category Range association with higher incidence of chronic pain  <18 kg/m2 Underweight   18.5-24.9 kg/m2 Ideal body weight   25-29.9 kg/m2 Overweight Increased incidence by 20%  30-34.9 kg/m2 Obese (Class I) Increased incidence by 68%  35-39.9 kg/m2 Severe obesity (Class II) Increased incidence by 136%  >40 kg/m2 Extreme obesity (Class III) Increased incidence by 254%   BMI Readings from Last 4 Encounters:  04/08/17 36.36 kg/m  03/07/17 35.71 kg/m  02/09/17 35.78 kg/m  01/27/17 34.18 kg/m   Wt Readings from Last 4 Encounters:  04/08/17 180 lb (81.6 kg)  03/07/17 189 lb (85.7 kg)  02/09/17 189 lb 6 oz (85.9 kg)  01/27/17 186 lb 14.4 oz (84.8 kg)

## 2017-04-09 DIAGNOSIS — G894 Chronic pain syndrome: Secondary | ICD-10-CM | POA: Insufficient documentation

## 2017-04-12 LAB — COMP. METABOLIC PANEL (12)
A/G RATIO: 1.4 (ref 1.2–2.2)
ALBUMIN: 4.2 g/dL (ref 3.5–4.7)
ALK PHOS: 71 IU/L (ref 39–117)
AST: 10 IU/L (ref 0–40)
BILIRUBIN TOTAL: 0.6 mg/dL (ref 0.0–1.2)
BUN/Creatinine Ratio: 21 (ref 12–28)
BUN: 20 mg/dL (ref 8–27)
CHLORIDE: 98 mmol/L (ref 96–106)
CREATININE: 0.95 mg/dL (ref 0.57–1.00)
Calcium: 10 mg/dL (ref 8.7–10.3)
GFR calc Af Amer: 64 mL/min/{1.73_m2} (ref >59)
GFR calc non Af Amer: 55 mL/min/{1.73_m2} — ABNORMAL LOW (ref >59)
GLOBULIN, TOTAL: 3 g/dL (ref 1.5–4.5)
Glucose: 139 mg/dL — ABNORMAL HIGH (ref 65–99)
POTASSIUM: 4.5 mmol/L (ref 3.5–5.2)
SODIUM: 140 mmol/L (ref 134–144)
Total Protein: 7.2 g/dL (ref 6.0–8.5)

## 2017-04-12 LAB — VITAMIN B12: Vitamin B-12: 244 pg/mL (ref 232–1245)

## 2017-04-12 LAB — MAGNESIUM: Magnesium: 1.9 mg/dL (ref 1.6–2.3)

## 2017-04-12 LAB — 25-HYDROXY VITAMIN D LCMS D2+D3
25-Hydroxy, Vitamin D-2: <1 ng/mL
25-Hydroxy, Vitamin D-3: 30 ng/mL
25-Hydroxy, Vitamin D: 31 ng/mL

## 2017-04-12 LAB — C-REACTIVE PROTEIN: CRP: 5.1 mg/L — AB (ref 0.0–4.9)

## 2017-04-12 LAB — SEDIMENTATION RATE: SED RATE: 48 mm/h — AB (ref 0–40)

## 2017-04-15 LAB — COMPLIANCE DRUG ANALYSIS, UR

## 2017-04-20 ENCOUNTER — Ambulatory Visit
Admission: RE | Admit: 2017-04-20 | Discharge: 2017-04-20 | Disposition: A | Discharge status: Home / Self Care | Payer: Medicare HMO | Source: Ambulatory Visit | Attending: Nurse Practitioner | Admitting: Nurse Practitioner

## 2017-04-20 DIAGNOSIS — I700 Atherosclerosis of aorta: Secondary | ICD-10-CM | POA: Diagnosis not present

## 2017-04-20 DIAGNOSIS — M533 Sacrococcygeal disorders, not elsewhere classified: Secondary | ICD-10-CM | POA: Insufficient documentation

## 2017-04-20 DIAGNOSIS — G8929 Other chronic pain: Secondary | ICD-10-CM

## 2017-04-20 DIAGNOSIS — M542 Cervicalgia: Secondary | ICD-10-CM | POA: Diagnosis not present

## 2017-04-20 DIAGNOSIS — M4802 Spinal stenosis, cervical region: Secondary | ICD-10-CM | POA: Diagnosis not present

## 2017-04-21 NOTE — Progress Notes (Signed)
Results were reviewed and found to be: abnormal  No acute injury or pathology identified  Review would suggest interventional pain management techniques may be of benefit

## 2017-05-12 DIAGNOSIS — I5022 Chronic systolic (congestive) heart failure: Secondary | ICD-10-CM | POA: Diagnosis not present

## 2017-05-12 DIAGNOSIS — I10 Essential (primary) hypertension: Secondary | ICD-10-CM | POA: Diagnosis not present

## 2017-05-12 DIAGNOSIS — I25810 Atherosclerosis of coronary artery bypass graft(s) without angina pectoris: Secondary | ICD-10-CM | POA: Diagnosis not present

## 2017-05-12 DIAGNOSIS — I350 Nonrheumatic aortic (valve) stenosis: Secondary | ICD-10-CM | POA: Diagnosis not present

## 2017-05-19 DIAGNOSIS — I495 Sick sinus syndrome: Secondary | ICD-10-CM | POA: Diagnosis not present

## 2017-05-25 ENCOUNTER — Ambulatory Visit: Payer: Medicare HMO | Admitting: Family

## 2017-06-03 ENCOUNTER — Emergency Department: Payer: Medicare HMO

## 2017-06-03 ENCOUNTER — Encounter: Payer: Self-pay | Admitting: Emergency Medicine

## 2017-06-03 ENCOUNTER — Emergency Department
Admission: EM | Admit: 2017-06-03 | Discharge: 2017-06-03 | Disposition: A | Discharge status: Home / Self Care | Payer: Medicare HMO | Attending: Emergency Medicine | Admitting: Emergency Medicine

## 2017-06-03 DIAGNOSIS — Z79899 Other long term (current) drug therapy: Secondary | ICD-10-CM | POA: Insufficient documentation

## 2017-06-03 DIAGNOSIS — E1122 Type 2 diabetes mellitus with diabetic chronic kidney disease: Secondary | ICD-10-CM | POA: Diagnosis not present

## 2017-06-03 DIAGNOSIS — M25562 Pain in left knee: Secondary | ICD-10-CM | POA: Diagnosis not present

## 2017-06-03 DIAGNOSIS — Z8673 Personal history of transient ischemic attack (TIA), and cerebral infarction without residual deficits: Secondary | ICD-10-CM | POA: Insufficient documentation

## 2017-06-03 DIAGNOSIS — I509 Heart failure, unspecified: Secondary | ICD-10-CM | POA: Diagnosis not present

## 2017-06-03 DIAGNOSIS — Z794 Long term (current) use of insulin: Secondary | ICD-10-CM | POA: Insufficient documentation

## 2017-06-03 DIAGNOSIS — G8929 Other chronic pain: Secondary | ICD-10-CM | POA: Diagnosis not present

## 2017-06-03 DIAGNOSIS — M25552 Pain in left hip: Secondary | ICD-10-CM | POA: Diagnosis not present

## 2017-06-03 DIAGNOSIS — M79605 Pain in left leg: Secondary | ICD-10-CM | POA: Diagnosis present

## 2017-06-03 DIAGNOSIS — M1712 Unilateral primary osteoarthritis, left knee: Secondary | ICD-10-CM | POA: Insufficient documentation

## 2017-06-03 DIAGNOSIS — I130 Hypertensive heart and chronic kidney disease with heart failure and stage 1 through stage 4 chronic kidney disease, or unspecified chronic kidney disease: Secondary | ICD-10-CM | POA: Insufficient documentation

## 2017-06-03 DIAGNOSIS — N189 Chronic kidney disease, unspecified: Secondary | ICD-10-CM | POA: Insufficient documentation

## 2017-06-03 DIAGNOSIS — M79662 Pain in left lower leg: Secondary | ICD-10-CM | POA: Diagnosis not present

## 2017-06-03 DIAGNOSIS — Z7901 Long term (current) use of anticoagulants: Secondary | ICD-10-CM | POA: Diagnosis not present

## 2017-06-03 DIAGNOSIS — M8589 Other specified disorders of bone density and structure, multiple sites: Secondary | ICD-10-CM | POA: Insufficient documentation

## 2017-06-03 MED ORDER — MENTHOL-CAMPHOR 16-4 % EX LOTN: 1.0000 "application " | TOPICAL_LOTION | Freq: Two times a day (BID) | CUTANEOUS | 0 refills | Status: DC | PRN

## 2017-06-03 MED ORDER — TRAMADOL HCL 50 MG PO TABS: 25.0000 mg | ORAL_TABLET | Freq: Four times a day (QID) | ORAL | 0 refills | Status: DC | PRN

## 2017-06-03 MED ORDER — TRAMADOL HCL 50 MG PO TABS
50.0000 mg | ORAL_TABLET | Freq: Once | ORAL | Status: AC
  Administered 2017-06-03: 50 mg via ORAL
  Filled 2017-06-03: qty 1

## 2017-06-03 NOTE — ED Triage Notes (Signed)
First Nurse Note:  Arrives with c/o left knee pain radiating up toward hip.  States pain started on Saturday.  C/O pain impacting her mobility, stating she cannot walk on that leg.   Patient is AAOx3.  Skin warm and dry .NAD

## 2017-06-03 NOTE — ED Triage Notes (Addendum)
Patient presents to the ED with pain from left knee to left hip pain since Sunday.  Patient states, "I fell last week one time but I didn't hurt myself."  Patient denies any other injury to left leg.  Patient reports increased difficulty walking.  Patient denies pain while sitting still but states it is a 10/10 when she stands.  Patient has arthritis to her lower back and states, "I"m trying to get in with the pain clinic but I didn't have the money to pay the co-pay for the class at [facility]."  Patient states, "all I have at home for pain is tylenol and a couple of BC powders".

## 2017-06-03 NOTE — ED Notes (Signed)
NAD Noted at time of D/C. Pt taken to lobby via wheelchair by her family. Pt denies any comments/concerns regarding D/C instructions at this time.

## 2017-06-03 NOTE — ED Provider Notes (Signed)
[facility] Emergency Department Provider Note  ____________________________________________  Time seen: Approximately 4:27 PM  I have reviewed the triage vital signs and the nursing notes.   HISTORY  Chief Complaint Leg Pain    HPI Amber [name] is a 83 y.o. female that presents emergency department for evaluation of left knee and left hip for 3 days.  Pain starts in the front of her knee and radiates up to her hip.  It is worse with walking.  She was unable to put socks on today due to pain. She is concerned it's arthritis. Patient states that she fell last week after turning too fast in the bathroom but did not injure herself. She did not hit her head.  She has not taken anything today for pain. Patient lives with daughter and law and son. Patient spends most of her time in bed. She has taken tramadol in the past without difficulty. She had a recent xray completed of her hip.  She is trying to get into a pain clinic.  No nausea, vomiting, abdominal pain, numbness, tingling.    Past Medical History:  Diagnosis Date  . Anemia   . Anxiety   . Arthritis   . Benign neoplasm of colon   . CHF (congestive heart failure) ([place])   . Chronic kidney disease   . Chronic pain   . Complication of anesthesia 1980's   hard time waking up  . Coronary artery disease with unspecified angina pectoris   . Cough   . Diabetes mellitus without complication ([place])   . Essential hypertension   . High risk medication use   . Hyperlipidemia   . Plantar fascial fibromatosis   . Presence of permanent cardiac pacemaker   . Shortness of breath dyspnea     Patient Active Problem List   Diagnosis Date Noted  . Chronic pain syndrome 04/09/2017  . Other specified health status 04/08/2017  . Disorder of bone, unspecified 04/08/2017  . Long term current use of opiate analgesic 04/08/2017  . Chronic low back pain (Primary Area of Pain) (Bilateral) (L>R) 04/08/2017  . Chronic  pain of both lower extremities  (Tertiary Area of Pain) 04/08/2017  . Chronic groin pain (Secondary Area of Pain) 04/08/2017  . Chronic neck pain (Fourth Area of Pain) (Bilateral) (L>R) 04/08/2017  . Chronic sacroiliac joint pain 04/08/2017  . Other long term (current) drug therapy 04/08/2017  . Syncope 02/09/2017  . Asthmatic bronchitis 01/26/2017  . Fall 10/06/2016  . Anxiety 08/08/2016  . TIA (transient ischemic attack) 07/25/2015  . Sick sinus syndrome ([place]) 03/14/2015  . Bradycardia 02/16/2015  . Chronic heart failure ([place]) 01/05/2015  . Fracture dislocation of ankle 07/27/2014  . IDDM (insulin dependent diabetes mellitus) ([place]) 07/27/2014  . Essential hypertension 07/27/2014  . Multiple rib fractures 07/27/2014  . 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 ABDOMINAL HYSTERECTOMY    . BACK SURGERY  1960's   cage and screws in lower back  . CATARACT EXTRACTION W/ INTRAOCULAR LENS  IMPLANT, BILATERAL Bilateral   . CORONARY ARTERY BYPASS GRAFT  2008  . JOINT REPLACEMENT Right    hip and knee  . ORIF ANKLE FRACTURE Right 07/27/2014   Procedure: OPEN REDUCTION INTERNAL FIXATION (ORIF) ANKLE FRACTURE;  Surgeon: Alta Corning, MD;  Location: [facility];  Service: Orthopedics;  Laterality: Right;  . PACEMAKER INSERTION Left 03/14/2015   Procedure: INSERTION PACEMAKER;  Surgeon: Isaias Cowman, MD;  Location: [facility];  Service: Cardiovascular;  Laterality: Left;  . ROTATOR CUFF REPAIR    . TOTAL HIP ARTHROPLASTY Right   . TOTAL KNEE ARTHROPLASTY Right   . VEIN LIGATION AND STRIPPING      Prior to Admission medications   Medication Sig Start Date End Date Taking? Authorizing Provider  albuterol (PROVENTIL HFA;VENTOLIN HFA) 108 (90 Base) MCG/ACT inhaler Inhale 2 puffs into the lungs every 6 (six) hours as needed for wheezing or shortness of breath. 01/28/17   Loletha Grayer, MD  alendronate (FOSAMAX) 70 MG tablet Take 70 mg by mouth  every Thursday.     [provider]  ALPRAZolam Duanne Moron) 1 MG tablet Take 1 tablet (1 mg total) by mouth at bedtime as needed for anxiety. Patient taking differently: Take 0.5 mg by mouth at bedtime as needed for anxiety.  07/31/14   Elgergawy, Silver Huguenin, MD  amLODipine (NORVASC) 10 MG tablet Take 5 mg by mouth daily.     [provider]  apixaban (ELIQUIS) 5 MG TABS tablet Take 5 mg by mouth 2 (two) times daily.    [provider]  calcium carbonate (OSCAL) 1500 (600 Ca) MG TABS tablet Take 600 mg of elemental calcium by mouth daily with breakfast.    [provider]  carvedilol (COREG) 6.25 MG tablet Take 1 tablet (6.25 mg total) by mouth 2 (two) times daily with a meal. 01/28/17   Leslye Peer, Richard, MD  furosemide (LASIX) 20 MG tablet Take 1 tablet (20 mg total) by mouth daily. 08/07/16 08/07/17  Alisa Graff, FNP  gabapentin (NEURONTIN) 100 MG capsule Take 200 mg by mouth 3 (three) times daily.     [provider]  insulin aspart protamine- aspart (NOVOLOG MIX 70/30) (70-30) 100 UNIT/ML injection Inject 0.15 mLs (15 Units total) into the skin 2 (two) times daily with a meal. 01/28/17   Leslye Peer, Richard, MD  Insulin NPH Isophane & Regular (NOVOLIN 70/30 [place]) Inject 10 Units into the skin 2 (two) times daily. prn    [provider]  ipratropium-albuterol (DUONEB) 0.5-2.5 (3) MG/3ML SOLN Inhale 3 mLs into the lungs 4 (four) times daily. 02/06/17 02/01/18  [provider]  levETIRAcetam (KEPPRA) 500 MG tablet Take 1 tablet by mouth 2 (two) times daily. 03/25/17 07/23/17  [provider]  Menthol-Camphor (ICY HOT ARTHRITIS PAIN RELIEF) 16-4 % LOTN Apply 1 application topically 2 (two) times daily as needed. 06/03/17   Laban Emperor, PA-C  Multiple Vitamins-Minerals (PRESERVISION AREDS 2) CAPS Take 1 capsule by mouth daily.    [provider]  omeprazole (PRILOSEC) 20 MG capsule Take 20 mg by mouth daily.    [provider]   pravastatin (PRAVACHOL) 40 MG tablet Take 40 mg by mouth at bedtime.    [provider]  sacubitril-valsartan (ENTRESTO) 24-26 MG Take 1 tablet by mouth 2 (two) times daily.    [provider]  traMADol (ULTRAM) 50 MG tablet Take 0.5 tablets (25 mg total) by mouth every 6 (six) hours as needed. 06/03/17 06/03/18  Laban Emperor, PA-C    Allergies Ambien [zolpidem tartrate]; Etodolac; Iodine; Nsaids; Penicillins; and Succinylcholine  Family History  Problem Relation Age of Onset  . Heart attack Mother   . Heart disease Father   . Alzheimer's disease Sister   . Cervical cancer Sister   . Heart failure Son     Social History Social History   Tobacco Use  . Smoking status: Never Smoker  . Smokeless tobacco: Never Used  Substance Use Topics  . Alcohol use: No  . Drug use: No     Review of Systems  Constitutional: No fever/chills ENT: No upper respiratory complaints. Cardiovascular: No chest pain. Respiratory: No SOB. Gastrointestinal: No abdominal pain.  No nausea, no vomiting.  Skin: Negative for rash, abrasions, lacerations, ecchymosis. Neurological: Negative for headaches, numbness or tingling   ____________________________________________   PHYSICAL EXAM:  VITAL SIGNS: ED Triage Vitals  Enc Vitals Group     BP 06/03/17 1540 (!) 145/68     Pulse Rate 06/03/17 1540 65     Resp 06/03/17 1540 18     Temp 06/03/17 1540 98.1 F (36.7 C)     Temp Source 06/03/17 1540 Oral     SpO2 06/03/17 1540 96 %     Weight 06/03/17 1540 180 lb (81.6 kg)     Height 06/03/17 1540 4\' 11"  (1.499 m)     Head Circumference --      Peak Flow --      Pain Score 06/03/17 1537 0     Pain Loc --      Pain Edu? --      Excl. in [place]? --      Constitutional: Alert and oriented. Well appearing and in no acute distress. Eyes: Conjunctivae are normal. PERRL. EOMI. Head: Atraumatic. ENT:      Ears:      Nose: No congestion/rhinnorhea.      Mouth/Throat: Mucous  membranes are moist.  Neck: No stridor.   Cardiovascular: Normal rate, regular rhythm.  Good peripheral circulation. Respiratory: Normal respiratory effort without tachypnea or retractions. Lungs CTAB. Good air entry to the bases with no decreased or absent breath sounds. Gastrointestinal: Bowel sounds 4 quadrants. Soft and nontender to palpation. No guarding or rigidity. No palpable masses. No distention. Musculoskeletal: Full range of motion to all extremities. No gross deformities appreciated.  No tenderness to palpation over hip or lumbar spine.  Diffuse tenderness to palpation over left knee.  No visible swelling, rash, ecchymosis.  Patient walking with a limp. Neurologic:  Normal speech and language. No gross focal neurologic deficits are appreciated.  Skin:  Skin is warm, dry and intact. No rash noted.   ____________________________________________   LABS (all labs ordered are listed, but only abnormal results are displayed)  Labs Reviewed - No data to display ____________________________________________  EKG   ____________________________________________  RADIOLOGY Robinette Haines, personally viewed and evaluated these images (plain radiographs) as part of my medical decision making, as well as reviewing the written report by the radiologist.  US Venous Img Lower Unilateral Left  Result Date: 06/03/2017 CLINICAL DATA:  83 year old female with left lower extremity pain for the past week since falling. EXAM: LEFT LOWER EXTREMITY VENOUS DOPPLER ULTRASOUND TECHNIQUE: Gray-scale sonography with graded compression, as well as color Doppler and duplex ultrasound were performed to evaluate the lower extremity deep venous systems from the level of the common femoral vein and including the common femoral, femoral, profunda femoral, popliteal and calf veins including the posterior tibial, peroneal and gastrocnemius veins when visible. The superficial great saphenous vein was also  interrogated. Spectral Doppler was utilized to evaluate flow at rest and with distal augmentation maneuvers in the common femoral, femoral and popliteal veins. COMPARISON:  None. FINDINGS: Contralateral Common Femoral Vein: Respiratory phasicity is normal and symmetric with the symptomatic side. No evidence of thrombus. Normal compressibility. Common Femoral Vein: No evidence of thrombus. Normal compressibility, respiratory phasicity and response to augmentation. Saphenofemoral Junction: No evidence  of thrombus. Normal compressibility and flow on color Doppler imaging. Profunda Femoral Vein: No evidence of thrombus. Normal compressibility and flow on color Doppler imaging. Femoral Vein: No evidence of thrombus. Normal compressibility, respiratory phasicity and response to augmentation. Popliteal Vein: No evidence of thrombus. Normal compressibility, respiratory phasicity and response to augmentation. Calf Veins: No evidence of thrombus. Normal compressibility and flow on color Doppler imaging. Superficial Great Saphenous Vein: No evidence of thrombus. Normal compressibility. Venous Reflux:  None. Other Findings:  None. IMPRESSION: No evidence of deep venous thrombosis. Electronically Signed   By: Jacqulynn Cadet M.D.   On: 06/03/2017 17:29   Dg Knee Complete 4 Views Left  Result Date: 06/03/2017 CLINICAL DATA:  83 y/o  F; left leg pain. EXAM: LEFT KNEE - COMPLETE 4+ VIEW COMPARISON:  None. FINDINGS: Moderate to severe medial femorotibial compartment joint space narrowing. Sclerosis of articular surfaces. Moderate tricompartmental osteophytosis. Suprapatellar joint effusion. No acute fracture identified. Vascular calcifications. IMPRESSION: 1. Tricompartmental osteoarthrosis with moderate to severe femorotibial compartment joint space narrowing and tricompartmental osteophytosis. 2. Suprapatellar joint effusion. 3. No acute fracture identified. Electronically Signed   By: Kristine Garbe M.D.   On:  06/03/2017 16:35   Dg Hip Unilat W Or Wo Pelvis 2-3 Views Left  Result Date: 06/03/2017 CLINICAL DATA:  Left hip pain for 4 days.  Recent fall. EXAM: DG HIP (WITH OR WITHOUT PELVIS) 2-3V LEFT COMPARISON:  04/20/2017 FINDINGS: Remote postop changes of the lumbar spine and right hip. Previous right total hip arthroplasty noted. Bones are osteopenic. Left hip appears intact. No acute osseous finding or malalignment. Iliac and femoral atherosclerosis noted. Normal bowel gas pattern. IMPRESSION: Chronic postoperative findings. No acute finding by plain radiography Osteopenia Atherosclerosis Electronically Signed   By: Jerilynn Mages.  Shick M.D.   On: 06/03/2017 16:35    ____________________________________________    PROCEDURES  Procedure(s) performed:    Procedures    Medications  traMADol (ULTRAM) tablet 50 mg (50 mg Oral Given 06/03/17 1717)     ____________________________________________   INITIAL IMPRESSION / ASSESSMENT AND PLAN / ED COURSE  Pertinent labs & imaging results that were available during my care of the patient were reviewed by me and considered in my medical decision making (see chart for details).  Review of the [place] CSRS was performed in accordance of the [place] prior to dispensing any controlled drugs.   Patient's diagnosis is consistent with osteoporosis and osteopenia.  Vital signs and exam are reassuring.  X-ray consistent with osteoporosis and osteopenia.  Ultrasound negative for DVT.  Patient felt better after tramadol and was able to ambulate with minimal pain.  Patient will be discharged home with prescriptions for a low-dose of tramadol and IcyHot. Patient is to follow up with PCP as directed. Patient is given ED precautions to return to the ED for any worsening or new symptoms.     ____________________________________________  FINAL CLINICAL IMPRESSION(S) / ED DIAGNOSES  Final diagnoses:  Osteoarthritis of left knee, unspecified osteoarthritis type  Osteopenia  of multiple sites       This chart was dictated using voice recognition software/Dragon. Despite best efforts to proofread, errors can occur which can change the meaning. Any change was purely unintentional.    Laban Emperor, PA-C 06/03/17 2358    Carrie Mew, MD 06/07/17 [phone]

## 2017-06-08 ENCOUNTER — Encounter: Payer: Self-pay | Admitting: Family

## 2017-06-08 ENCOUNTER — Ambulatory Visit: Payer: Medicare HMO | Attending: Family | Admitting: Family

## 2017-06-08 ENCOUNTER — Other Ambulatory Visit: Payer: Self-pay

## 2017-06-08 VITALS — BP 120/61 | HR 62 | Resp 18 | Ht 61.0 in | Wt 172.4 lb

## 2017-06-08 DIAGNOSIS — Z79899 Other long term (current) drug therapy: Secondary | ICD-10-CM | POA: Insufficient documentation

## 2017-06-08 DIAGNOSIS — F419 Anxiety disorder, unspecified: Secondary | ICD-10-CM | POA: Insufficient documentation

## 2017-06-08 DIAGNOSIS — E1122 Type 2 diabetes mellitus with diabetic chronic kidney disease: Secondary | ICD-10-CM | POA: Diagnosis not present

## 2017-06-08 DIAGNOSIS — G8929 Other chronic pain: Secondary | ICD-10-CM | POA: Insufficient documentation

## 2017-06-08 DIAGNOSIS — I5022 Chronic systolic (congestive) heart failure: Secondary | ICD-10-CM | POA: Diagnosis not present

## 2017-06-08 DIAGNOSIS — M25552 Pain in left hip: Secondary | ICD-10-CM | POA: Insufficient documentation

## 2017-06-08 DIAGNOSIS — Z96651 Presence of right artificial knee joint: Secondary | ICD-10-CM | POA: Diagnosis not present

## 2017-06-08 DIAGNOSIS — Z96641 Presence of right artificial hip joint: Secondary | ICD-10-CM | POA: Diagnosis not present

## 2017-06-08 DIAGNOSIS — G894 Chronic pain syndrome: Secondary | ICD-10-CM

## 2017-06-08 DIAGNOSIS — Z880 Allergy status to penicillin: Secondary | ICD-10-CM | POA: Insufficient documentation

## 2017-06-08 DIAGNOSIS — I130 Hypertensive heart and chronic kidney disease with heart failure and stage 1 through stage 4 chronic kidney disease, or unspecified chronic kidney disease: Secondary | ICD-10-CM | POA: Diagnosis not present

## 2017-06-08 DIAGNOSIS — E785 Hyperlipidemia, unspecified: Secondary | ICD-10-CM | POA: Insufficient documentation

## 2017-06-08 DIAGNOSIS — I25119 Atherosclerotic heart disease of native coronary artery with unspecified angina pectoris: Secondary | ICD-10-CM | POA: Diagnosis not present

## 2017-06-08 DIAGNOSIS — M25561 Pain in right knee: Secondary | ICD-10-CM | POA: Insufficient documentation

## 2017-06-08 DIAGNOSIS — N189 Chronic kidney disease, unspecified: Secondary | ICD-10-CM | POA: Diagnosis not present

## 2017-06-08 DIAGNOSIS — Z7901 Long term (current) use of anticoagulants: Secondary | ICD-10-CM | POA: Insufficient documentation

## 2017-06-08 DIAGNOSIS — Z794 Long term (current) use of insulin: Secondary | ICD-10-CM | POA: Diagnosis not present

## 2017-06-08 DIAGNOSIS — D649 Anemia, unspecified: Secondary | ICD-10-CM | POA: Insufficient documentation

## 2017-06-08 DIAGNOSIS — Z950 Presence of cardiac pacemaker: Secondary | ICD-10-CM | POA: Insufficient documentation

## 2017-06-08 DIAGNOSIS — M1990 Unspecified osteoarthritis, unspecified site: Secondary | ICD-10-CM | POA: Diagnosis not present

## 2017-06-08 DIAGNOSIS — I10 Essential (primary) hypertension: Secondary | ICD-10-CM

## 2017-06-08 NOTE — Progress Notes (Signed)
Patient ID: Amber [name], female    DOB: Mar 16, 1930, 83 y.o.   MRN: [id number]  HPI  Amber [name] is a 83 y/o female with a history of hyperlipidemia, HTN, DM, CAD, chronic pain, CKD, arthritis, anxiety, anemia and chronic heart failure.   Reviewed echo done 01/27/17 which showed an EF of 40-45% along with mild MR. Previous echo done 07/27/16 showed an EF of 35-40% along with moderate MR/TR. This is essentially unchanged from a previous echo done August 2017.  Was in the ED 06/03/17 due to left knee/hip pain. Xrays were done and she was treated and released. Was in the ED 03/07/17 due to bilateral flank pain. She was treated and released. Admitted 01/26/17 due to asthmatic bronchitis. Started on nebulizers, steroids and antibiotics. Discharged home after 2 days with a prednisone taper. Was in the ED 01/18/17 after a fainting episode at home with right ankle injury. Diagnosed with a closed fracture of sesamoid bone in right foot. Discharged home.   She presents today for a follow-up visit with a chief complaint of chronic pain in her hips/knee/back. She describes this as chronic in nature having been present for several years with varying levels of severity. She has associated fatigue, shortness of breath, difficulty sleeping and anxiety along with this. She denies any chest pain, edema, palpitations or weight gain.   Past Medical History:  Diagnosis Date  . Anemia   . Anxiety   . Arthritis   . Benign neoplasm of colon   . CHF (congestive heart failure) ([place])   . Chronic kidney disease   . Chronic pain   . Complication of anesthesia 1980's   hard time waking up  . Coronary artery disease with unspecified angina pectoris   . Cough   . Diabetes mellitus without complication ([place])   . Essential hypertension   . High risk medication use   . Hyperlipidemia   . Plantar fascial fibromatosis   . Presence of permanent cardiac pacemaker   . Shortness of breath dyspnea    Past Surgical History:   Procedure Laterality Date  . ABDOMINAL HYSTERECTOMY    . BACK SURGERY  1960's   cage and screws in lower back  . CATARACT EXTRACTION W/ INTRAOCULAR LENS  IMPLANT, BILATERAL Bilateral   . CORONARY ARTERY BYPASS GRAFT  2008  . JOINT REPLACEMENT Right    hip and knee  . ORIF ANKLE FRACTURE Right 07/27/2014   Procedure: OPEN REDUCTION INTERNAL FIXATION (ORIF) ANKLE FRACTURE;  Surgeon: Alta Corning, MD;  Location: [facility];  Service: Orthopedics;  Laterality: Right;  . PACEMAKER INSERTION Left 03/14/2015   Procedure: INSERTION PACEMAKER;  Surgeon: Isaias Cowman, MD;  Location: [facility];  Service: Cardiovascular;  Laterality: Left;  . ROTATOR CUFF REPAIR    . TOTAL HIP ARTHROPLASTY Right   . TOTAL KNEE ARTHROPLASTY Right   . VEIN LIGATION AND STRIPPING     Family History  Problem Relation Age of Onset  . Heart attack Mother   . Heart disease Father   . Alzheimer's disease Sister   . Cervical cancer Sister   . Heart failure Son    Social History   Tobacco Use  . Smoking status: Never Smoker  . Smokeless tobacco: Never Used  Substance Use Topics  . Alcohol use: No   Allergies  Allergen Reactions  . Ambien [Zolpidem Tartrate] Other (See Comments)    Reaction:  Keeps pt awake   . Etodolac Other (See Comments)    Reaction:  Unknown   . Iodine Itching  . Nsaids Other (See Comments)    Reaction:  Unknown   . Penicillins Itching and Other (See Comments)    Has patient had a PCN reaction causing immediate rash, facial/tongue/throat swelling, SOB or lightheadedness with hypotension: No Has patient had a PCN reaction causing severe rash involving mucus membranes or skin necrosis: No Has patient had a PCN reaction that required hospitalization No Has patient had a PCN reaction occurring within the last 10 years: No If all of the above answers are "NO", then may proceed with Cephalosporin use.  . Succinylcholine Other (See Comments)    Reaction:  Unknown    Prior to Admission  medications   Medication Sig Start Date End Date Taking? Authorizing Provider  albuterol (PROVENTIL HFA;VENTOLIN HFA) 108 (90 Base) MCG/ACT inhaler Inhale 2 puffs into the lungs every 6 (six) hours as needed for wheezing or shortness of breath. 01/28/17  Yes Wieting, Richard, MD  ALPRAZolam Duanne Moron) 1 MG tablet Take 1 tablet (1 mg total) by mouth at bedtime as needed for anxiety. Patient taking differently: Take 0.5 mg by mouth at bedtime as needed for anxiety.  07/31/14  Yes Elgergawy, Silver Huguenin, MD  amLODipine (NORVASC) 10 MG tablet Take 5 mg by mouth daily.    Yes [provider]  apixaban (ELIQUIS) 5 MG TABS tablet Take 5 mg by mouth 2 (two) times daily.   Yes [provider]  calcium carbonate (OSCAL) 1500 (600 Ca) MG TABS tablet Take 600 mg of elemental calcium by mouth daily with breakfast.   Yes [provider]  carvedilol (COREG) 6.25 MG tablet Take 1 tablet (6.25 mg total) by mouth 2 (two) times daily with a meal. 01/28/17  Yes Wieting, Richard, MD  furosemide (LASIX) 20 MG tablet Take 1 tablet (20 mg total) by mouth daily. 08/07/16 08/07/17 Yes Hackney, Otila Kluver A, FNP  insulin aspart protamine- aspart (NOVOLOG MIX 70/30) (70-30) 100 UNIT/ML injection Inject 0.15 mLs (15 Units total) into the skin 2 (two) times daily with a meal. 01/28/17  Yes Wieting, Richard, MD  Insulin NPH Isophane & Regular (NOVOLIN 70/30 [place]) Inject 10 Units into the skin 2 (two) times daily. prn   Yes [provider]  ipratropium-albuterol (DUONEB) 0.5-2.5 (3) MG/3ML SOLN Inhale 3 mLs into the lungs 4 (four) times daily. 02/06/17 02/01/18 Yes [provider]  levETIRAcetam (KEPPRA) 500 MG tablet Take 1 tablet by mouth 2 (two) times daily. 03/25/17 07/23/17 Yes [provider]  alendronate (FOSAMAX) 70 MG tablet Take 70 mg by mouth every Thursday.     [provider]  gabapentin (NEURONTIN) 100 MG capsule Take 200 mg by mouth 3 (three) times daily.     [provider]  Menthol-Camphor (ICY HOT ARTHRITIS PAIN RELIEF) 16-4 % LOTN Apply 1 application topically 2 (two) times daily as needed. Patient not taking: Reported on 06/08/2017 06/03/17   Amber Emperor, PA-C  Multiple Vitamins-Minerals (PRESERVISION AREDS 2) CAPS Take 1 capsule by mouth daily.    [provider]  omeprazole (PRILOSEC) 20 MG capsule Take 20 mg by mouth daily.    [provider]  pravastatin (PRAVACHOL) 40 MG tablet Take 40 mg by mouth at bedtime.    [provider]  sacubitril-valsartan (ENTRESTO) 24-26 MG Take 1 tablet by mouth 2 (two) times daily.    [provider]  traMADol (ULTRAM) 50 MG tablet Take 0.5 tablets (25 mg total) by mouth every 6 (six) hours as needed. Patient  not taking: Reported on 06/08/2017 06/03/17 06/03/18  Amber Emperor, PA-C    Review of Systems  Constitutional: Positive for fatigue. Negative for appetite change.  HENT: Negative for congestion, postnasal drip and sore throat.   Eyes: Negative.   Respiratory: Positive for cough, shortness of breath and wheezing (better). Negative for chest tightness.   Cardiovascular: Negative for chest pain, palpitations and leg swelling.  Gastrointestinal: Negative for abdominal distention and abdominal pain.  Endocrine: Negative.   Genitourinary: Negative.   Musculoskeletal: Positive for arthralgias (right knee pain & left hip pain). Negative for neck pain.  Skin: Negative.           Allergic/Immunologic: Negative.   Neurological: Negative for dizziness, light-headedness and numbness.  Hematological: Negative for adenopathy. Bruises/bleeds easily.  Psychiatric/Behavioral: Positive for sleep disturbance (didn't sleep well last night due to stress). Negative for dysphoric mood and suicidal ideas. The patient is nervous/anxious.    Vitals:   06/08/17 1033  BP: 120/61  Pulse: 62  Resp: 18  SpO2: 94%  Weight: 172 lb 6 oz (78.2 kg)  Height: 5\' 1"  (1.549 m)   Wt Readings from Last 3  Encounters:  06/08/17 172 lb 6 oz (78.2 kg)  06/03/17 180 lb (81.6 kg)  04/08/17 180 lb (81.6 kg)    Lab Results  Component Value Date   CREATININE 0.95 04/08/2017   CREATININE 0.89 01/27/2017   CREATININE 0.96 01/26/2017    Physical Exam  Constitutional: She is oriented to person, place, and time. She appears well-developed and well-nourished.  HENT:  Head: Normocephalic and atraumatic.  Neck: Normal range of motion. Neck supple. No JVD present.  Cardiovascular: Normal rate and regular rhythm.  Pulmonary/Chest: Effort normal. She has no wheezes. She has rhonchi in the right lower field and the left lower field. She has no rales.  Abdominal: Soft. She exhibits no distension. There is no tenderness.  Musculoskeletal: She exhibits no edema or tenderness.  Neurological: She is alert and oriented to person, place, and time.  Skin: Skin is warm and dry.  Psychiatric: She has a normal mood and affect. Her behavior is normal. Thought content normal.  Nursing note and vitals reviewed.   Assessment & Plan:  1: Chronic heart failure with reduced ejection fraction- - NYHA class II - euvolemic - continue weighing daily. Reminded to call for an overnight weight gain of >2 pounds or a weekly weight gain of >5 pounds.  - weight down 17 pounds since she was last here; says that she's eating well - not adding salt to her food and is trying to eat low sodium foods  - has been without numerous medications for the last couple of weeks due to an issue with her mailorder. 56 samples of entresto 24/26mg  given. Offered to send new RX's to her local pharmacy but she prefers to wait as she's concerned she'll get charged again. Daughter is going to call them again when she gets home today - saw her cardiologist Nehemiah Massed) 05/12/17 - BMP from 04/08/17 reviewed and shows sodium 140, potassium 4.5 and GFR 55 - will not titrate up entresto due to history of syncope  2: HTN- - BP looks good today - continue  medications at this time - saw PCP ([facility]) 03/18/17  3: Anxiety- - continues to be under quite a bit of family stress although she doesn't elaborate  - not sleeping well due to stress - emotional support offered - denies any suicidal thoughts  4: Chronic pain- - patient complains of  knee/hip pain. Has been seen by pain management and she has an appointment to be evaluated at [facility] per pain management's protocol  Patient did not bring her medications nor a list. Each medication was verbally reviewed with the patient and she was encouraged to bring the bottles to every visit to confirm accuracy of list.  Return in 3 months or sooner for any questions/problems before next office visit.

## 2017-06-08 NOTE — Patient Instructions (Signed)
Continue weighing daily and call for an overnight weight gain of > 2 pounds or a weekly weight gain of >5 pounds. 

## 2017-07-29 DIAGNOSIS — G894 Chronic pain syndrome: Secondary | ICD-10-CM | POA: Diagnosis not present

## 2017-07-29 DIAGNOSIS — F411 Generalized anxiety disorder: Secondary | ICD-10-CM | POA: Diagnosis not present

## 2017-07-29 DIAGNOSIS — E119 Type 2 diabetes mellitus without complications: Secondary | ICD-10-CM | POA: Diagnosis not present

## 2017-07-29 DIAGNOSIS — Z794 Long term (current) use of insulin: Secondary | ICD-10-CM | POA: Diagnosis not present

## 2017-07-29 DIAGNOSIS — Z79899 Other long term (current) drug therapy: Secondary | ICD-10-CM | POA: Diagnosis not present

## 2017-07-29 DIAGNOSIS — I10 Essential (primary) hypertension: Secondary | ICD-10-CM | POA: Diagnosis not present

## 2017-07-29 DIAGNOSIS — E782 Mixed hyperlipidemia: Secondary | ICD-10-CM | POA: Diagnosis not present

## 2017-07-29 DIAGNOSIS — D649 Anemia, unspecified: Secondary | ICD-10-CM | POA: Diagnosis not present

## 2017-07-29 DIAGNOSIS — I5022 Chronic systolic (congestive) heart failure: Secondary | ICD-10-CM | POA: Diagnosis not present

## 2017-09-04 NOTE — Progress Notes (Deleted)
Patient ID: Amber [name], female    DOB: 05/14/1930, 84 y.o.   MRN: [id number]  HPI  Ms Furber is a 84 y/o female with a history of hyperlipidemia, HTN, DM, CAD, chronic pain, CKD, arthritis, anxiety, anemia and chronic heart failure.   Reviewed echo done 01/27/17 which showed an EF of 40-45% along with mild MR. Previous echo done 07/27/16 showed an EF of 35-40% along with moderate MR/TR. This is essentially unchanged from a previous echo done August 2017.  Was in the ED 06/03/17 due to left knee/hip pain. Xrays were done and she was treated and released. Was in the ED 03/07/17 due to bilateral flank pain. She was treated and released. Admitted 01/26/17 due to asthmatic bronchitis. Started on nebulizers, steroids and antibiotics. Discharged home after 2 days with a prednisone taper. Was in the ED 01/18/17 after a fainting episode at home with right ankle injury. Diagnosed with a closed fracture of sesamoid bone in right foot. Discharged home.   She presents today for a follow-up visit with a chief complaint of   Past Medical History:  Diagnosis Date  . Anemia   . Anxiety   . Arthritis   . Benign neoplasm of colon   . CHF (congestive heart failure) ([place])   . Chronic kidney disease   . Chronic pain   . Complication of anesthesia 1980's   hard time waking up  . Coronary artery disease with unspecified angina pectoris   . Cough   . Diabetes mellitus without complication ([place])   . Essential hypertension   . High risk medication use   . Hyperlipidemia   . Plantar fascial fibromatosis   . Presence of permanent cardiac pacemaker   . Shortness of breath dyspnea    Past Surgical History:  Procedure Laterality Date  . ABDOMINAL HYSTERECTOMY    . BACK SURGERY  1960's   cage and screws in lower back  . CATARACT EXTRACTION W/ INTRAOCULAR LENS  IMPLANT, BILATERAL Bilateral   . CORONARY ARTERY BYPASS GRAFT  2008  . JOINT REPLACEMENT Right    hip and knee  . ORIF ANKLE FRACTURE Right  07/27/2014   Procedure: OPEN REDUCTION INTERNAL FIXATION (ORIF) ANKLE FRACTURE;  Surgeon: Alta Corning, MD;  Location: [facility];  Service: Orthopedics;  Laterality: Right;  . PACEMAKER INSERTION Left 03/14/2015   Procedure: INSERTION PACEMAKER;  Surgeon: Isaias Cowman, MD;  Location: [facility];  Service: Cardiovascular;  Laterality: Left;  . ROTATOR CUFF REPAIR    . TOTAL HIP ARTHROPLASTY Right   . TOTAL KNEE ARTHROPLASTY Right   . VEIN LIGATION AND STRIPPING     Family History  Problem Relation Age of Onset  . Heart attack Mother   . Heart disease Father   . Alzheimer's disease Sister   . Cervical cancer Sister   . Heart failure Son    Social History   Tobacco Use  . Smoking status: Never Smoker  . Smokeless tobacco: Never Used  Substance Use Topics  . Alcohol use: No   Allergies  Allergen Reactions  . Ambien [Zolpidem Tartrate] Other (See Comments)    Reaction:  Keeps pt awake   . Etodolac Other (See Comments)    Reaction:  Unknown   . Iodine Itching  . Nsaids Other (See Comments)    Reaction:  Unknown   . Penicillins Itching and Other (See Comments)    Has patient had a PCN reaction causing immediate rash, facial/tongue/throat swelling, SOB or lightheadedness with hypotension:  No Has patient had a PCN reaction causing severe rash involving mucus membranes or skin necrosis: No Has patient had a PCN reaction that required hospitalization No Has patient had a PCN reaction occurring within the last 10 years: No If all of the above answers are "NO", then may proceed with Cephalosporin use.  . Succinylcholine Other (See Comments)    Reaction:  Unknown      Review of Systems  Constitutional: Positive for fatigue. Negative for appetite change.  HENT: Negative for congestion, postnasal drip and sore throat.   Eyes: Negative.   Respiratory: Positive for cough, shortness of breath and wheezing (better). Negative for chest tightness.   Cardiovascular: Negative for chest pain,  palpitations and leg swelling.  Gastrointestinal: Negative for abdominal distention and abdominal pain.  Endocrine: Negative.   Genitourinary: Negative.   Musculoskeletal: Positive for arthralgias (right knee pain & left hip pain). Negative for neck pain.  Skin: Negative.           Allergic/Immunologic: Negative.   Neurological: Negative for dizziness, light-headedness and numbness.  Hematological: Negative for adenopathy. Bruises/bleeds easily.  Psychiatric/Behavioral: Positive for sleep disturbance (didn't sleep well last night due to stress). Negative for dysphoric mood and suicidal ideas. The patient is nervous/anxious.      Physical Exam  Constitutional: She is oriented to person, place, and time. She appears well-developed and well-nourished.  HENT:  Head: Normocephalic and atraumatic.  Neck: Normal range of motion. Neck supple. No JVD present.  Cardiovascular: Normal rate and regular rhythm.  Pulmonary/Chest: Effort normal. She has no wheezes. She has rhonchi in the right lower field and the left lower field. She has no rales.  Abdominal: Soft. She exhibits no distension. There is no tenderness.  Musculoskeletal: She exhibits no edema or tenderness.  Neurological: She is alert and oriented to person, place, and time.  Skin: Skin is warm and dry.  Psychiatric: She has a normal mood and affect. Her behavior is normal. Thought content normal.  Nursing note and vitals reviewed.   Assessment & Plan:  1: Chronic heart failure with reduced ejection fraction- - NYHA class II - euvolemic - continue weighing daily. Reminded to call for an overnight weight gain of >2 pounds or a weekly weight gain of >5 pounds.  - weight down 17 pounds since she was last here; says that she's eating well - not adding salt to her food and is trying to eat low sodium foods  - has been without numerous medications for the last couple of weeks due to an issue with her mailorder. 56 samples of entresto  24/26mg  given. Offered to send new RX's to her local pharmacy but she prefers to wait as she's concerned she'll get charged again. Daughter is going to call them again when she gets home today - saw her cardiologist Nehemiah Massed) 05/12/17 - BMP from 04/08/17 reviewed and shows sodium 140, potassium 4.5 and GFR 55 - will not titrate up entresto due to history of syncope  2: HTN- - BP looks good today - continue medications at this time - saw PCP ([place]) 03/18/17  3: Anxiety- - continues to be under quite a bit of family stress although she doesn't elaborate  - not sleeping well due to stress - emotional support offered - denies any suicidal thoughts  4: Chronic pain- - patient complains of knee/hip pain. Has been seen by pain management and she has an appointment to be evaluated at [facility] per pain management's protocol  Patient did  not bring her medications nor a list. Each medication was verbally reviewed with the patient and she was encouraged to bring the bottles to every visit to confirm accuracy of list.

## 2017-09-07 ENCOUNTER — Ambulatory Visit: Payer: Medicare HMO | Admitting: Family

## 2017-09-07 ENCOUNTER — Telehealth: Payer: Self-pay | Admitting: Family

## 2017-09-07 NOTE — Telephone Encounter (Signed)
Patient did not show for her [facility] appointment on 09/07/17. Will attempt to reschedule.

## 2017-10-20 ENCOUNTER — Other Ambulatory Visit: Payer: Self-pay | Admitting: Family

## 2017-11-14 NOTE — Progress Notes (Signed)
Patient ID: Amber [name], female    DOB: 1929-09-25, 84 y.o.   MRN: [id number]  HPI  Amber [name] is a 84 y/o female with a history of hyperlipidemia, HTN, DM, CAD, chronic pain, CKD, arthritis, anxiety, anemia and chronic heart failure.   Reviewed echo done 01/27/17 which showed an EF of 40-45% along with mild MR. Previous echo done 07/27/16 showed an EF of 35-40% along with moderate MR/TR. This is essentially unchanged from a previous echo done August 2017.  Was in the ED 06/03/17 due to left knee/hip pain. Xrays were done and she was treated and released.   She presents today for a follow-up visit with a chief complaint of minimal fatigue upon moderate exertion. She describes this as chronic in nature having been present for several years. She has associated cough, anxiety (due to stress), easy bruising and pain in her left hip and knees. She denies any difficulty sleeping, abdominal distention, palpitations, edema, chest pain, shortness of breath, dizziness or weight gain.   Past Medical History:  Diagnosis Date  . Anemia   . Anxiety   . Arthritis   . Benign neoplasm of colon   . CHF (congestive heart failure) ([place])   . Chronic kidney disease   . Chronic pain   . Complication of anesthesia 1980's   hard time waking up  . Coronary artery disease with unspecified angina pectoris   . Cough   . Diabetes mellitus without complication ([place])   . Essential hypertension   . High risk medication use   . Hyperlipidemia   . Plantar fascial fibromatosis   . Presence of permanent cardiac pacemaker   . Shortness of breath dyspnea    Past Surgical History:  Procedure Laterality Date  . ABDOMINAL HYSTERECTOMY    . BACK SURGERY  1960's   cage and screws in lower back  . CATARACT EXTRACTION W/ INTRAOCULAR LENS  IMPLANT, BILATERAL Bilateral   . CORONARY ARTERY BYPASS GRAFT  2008  . JOINT REPLACEMENT Right    hip and knee  . ORIF ANKLE FRACTURE Right 07/27/2014   Procedure: OPEN  REDUCTION INTERNAL FIXATION (ORIF) ANKLE FRACTURE;  Surgeon: Alta Corning, MD;  Location: [facility];  Service: Orthopedics;  Laterality: Right;  . PACEMAKER INSERTION Left 03/14/2015   Procedure: INSERTION PACEMAKER;  Surgeon: Isaias Cowman, MD;  Location: [facility];  Service: Cardiovascular;  Laterality: Left;  . ROTATOR CUFF REPAIR    . TOTAL HIP ARTHROPLASTY Right   . TOTAL KNEE ARTHROPLASTY Right   . VEIN LIGATION AND STRIPPING     Family History  Problem Relation Age of Onset  . Heart attack Mother   . Heart disease Father   . Alzheimer's disease Sister   . Cervical cancer Sister   . Heart failure Son    Social History   Tobacco Use  . Smoking status: Never Smoker  . Smokeless tobacco: Never Used  Substance Use Topics  . Alcohol use: No   Allergies  Allergen Reactions  . Ambien [Zolpidem Tartrate] Other (See Comments)    Reaction:  Keeps pt awake   . Etodolac Other (See Comments)    Reaction:  Unknown   . Iodine Itching  . Nsaids Other (See Comments)    Reaction:  Unknown   . Penicillins Itching and Other (See Comments)    Has patient had a PCN reaction causing immediate rash, facial/tongue/throat swelling, SOB or lightheadedness with hypotension: No Has patient had a PCN reaction causing severe rash  involving mucus membranes or skin necrosis: No Has patient had a PCN reaction that required hospitalization No Has patient had a PCN reaction occurring within the last 10 years: No If all of the above answers are "NO", then may proceed with Cephalosporin use.  . Succinylcholine Other (See Comments)    Reaction:  Unknown    Prior to Admission medications   Medication Sig Start Date End Date Taking? Authorizing Provider  albuterol (PROVENTIL HFA;VENTOLIN HFA) 108 (90 Base) MCG/ACT inhaler Inhale 2 puffs into the lungs every 6 (six) hours as needed for wheezing or shortness of breath. 01/28/17  Yes Wieting, Richard, MD  alendronate (FOSAMAX) 70 MG tablet Take 70 mg by mouth  every Thursday.    Yes [provider]  ALPRAZolam Duanne Moron) 1 MG tablet Take 1 tablet (1 mg total) by mouth at bedtime as needed for anxiety. Patient taking differently: Take 0.5 mg by mouth at bedtime as needed for anxiety.  07/31/14  Yes Elgergawy, Silver Huguenin, MD  amLODipine (NORVASC) 10 MG tablet Take 5 mg by mouth daily.    Yes [provider]  apixaban (ELIQUIS) 5 MG TABS tablet Take 5 mg by mouth 2 (two) times daily.   Yes [provider]  calcium carbonate (OSCAL) 1500 (600 Ca) MG TABS tablet Take 600 mg of elemental calcium by mouth daily with breakfast.   Yes [provider]  carvedilol (COREG) 6.25 MG tablet Take 1 tablet (6.25 mg total) by mouth 2 (two) times daily with a meal. 01/28/17  Yes Loletha Grayer, MD  furosemide (LASIX) 20 MG tablet TAKE ONE TABLET BY MOUTH ONCE DAILY 10/21/17  Yes Darylene Price A, FNP  gabapentin (NEURONTIN) 100 MG capsule Take 200 mg by mouth 3 (three) times daily.    Yes [provider]  insulin aspart protamine- aspart (NOVOLOG MIX 70/30) (70-30) 100 UNIT/ML injection Inject 0.15 mLs (15 Units total) into the skin 2 (two) times daily with a meal. 01/28/17  Yes Wieting, Richard, MD  Insulin NPH Isophane & Regular (NOVOLIN 70/30 [place]) Inject 10 Units into the skin 2 (two) times daily. prn   Yes [provider]  ipratropium-albuterol (DUONEB) 0.5-2.5 (3) MG/3ML SOLN Inhale 3 mLs into the lungs 4 (four) times daily. 02/06/17 02/01/18 Yes [provider]  levETIRAcetam (KEPPRA) 500 MG tablet Take 1 tablet by mouth 2 (two) times daily. 03/25/17 11/17/17 Yes [provider]  Menthol-Camphor (ICY HOT ARTHRITIS PAIN RELIEF) 16-4 % LOTN Apply 1 application topically 2 (two) times daily as needed. 06/03/17  Yes Laban Emperor, PA-C  Multiple Vitamins-Minerals (PRESERVISION AREDS 2) CAPS Take 1 capsule by mouth daily.   Yes [provider]  omeprazole (PRILOSEC) 20 MG capsule Take 20 mg by mouth daily.    Yes [provider]  pravastatin (PRAVACHOL) 40 MG tablet Take 40 mg by mouth at bedtime.   Yes [provider]  sacubitril-valsartan (ENTRESTO) 24-26 MG Take 1 tablet by mouth 2 (two) times daily.   Yes [provider]  traMADol (ULTRAM) 50 MG tablet Take 0.5 tablets (25 mg total) by mouth every 6 (six) hours as needed. 06/03/17 06/03/18 Yes Laban Emperor, PA-C    Review of Systems  Constitutional: Positive for fatigue. Negative for appetite change.  HENT: Negative for congestion, postnasal drip and sore throat.   Eyes: Negative.   Respiratory: Positive for cough. Negative for chest tightness, shortness of breath and wheezing.   Cardiovascular: Negative for chest pain, palpitations and leg swelling.  Gastrointestinal: Negative for  abdominal distention and abdominal pain.  Endocrine: Negative.   Genitourinary: Negative.   Musculoskeletal: Positive for arthralgias (right knee pain & left hip pain). Negative for neck pain.  Skin: Negative.   Allergic/Immunologic: Negative.   Neurological: Negative for dizziness, light-headedness and numbness.  Hematological: Negative for adenopathy. Bruises/bleeds easily.  Psychiatric/Behavioral: Negative for dysphoric mood, sleep disturbance (sleeping better) and suicidal ideas. The patient is nervous/anxious.    Vitals:   11/17/17 1120  BP: 126/65  Pulse: 64  Resp: 18  SpO2: 97%  Weight: 165 lb 4 oz (75 kg)  Height: 4\' 11"  (1.499 m)   Wt Readings from Last 3 Encounters:  11/17/17 165 lb 4 oz (75 kg)  06/08/17 172 lb 6 oz (78.2 kg)  06/03/17 180 lb (81.6 kg)   Lab Results  Component Value Date   CREATININE 0.95 04/08/2017   CREATININE 0.89 01/27/2017   CREATININE 0.96 01/26/2017    Physical Exam  Constitutional: She is oriented to person, place, and time. She appears well-developed and well-nourished.  HENT:  Head: Normocephalic and atraumatic.  Neck: Normal range of motion. Neck supple. No JVD present.   Cardiovascular: Normal rate and regular rhythm.  Pulmonary/Chest: Effort normal. She has no wheezes. She has no rhonchi. She has no rales.  Abdominal: Soft. She exhibits no distension. There is no tenderness.  Musculoskeletal: She exhibits no edema or tenderness.  Neurological: She is alert and oriented to person, place, and time.  Skin: Skin is warm and dry.  Psychiatric: She has a normal mood and affect. Her behavior is normal. Thought content normal.  Nursing note and vitals reviewed.  Assessment & Plan:  1: Chronic heart failure with reduced ejection fraction- - NYHA class II - euvolemic - continue weighing daily. Reminded to call for an overnight weight gain of >2 pounds or a weekly weight gain of >5 pounds.  - weight down 5 pounds since she was last here 06/08/17 - not adding salt to her food and is trying to eat low sodium foods  - saw her cardiologist Amber [name]) 05/12/17 - BMP from 07/29/17 reviewed and shows sodium 137, potassium 3.9 and GFR 80 - will not titrate up entresto due to history of syncope  2: HTN- - BP looks good today - continue medications at this time - saw PCP Amber [name]) 07/29/17  3: Anxiety- - continues to be under quite a bit of family stress although she doesn't elaborate  - denies any suicidal thoughts  4: Chronic pain- - patient complains of knee/hip pain. Has been seen by pain management and she had an appointment to be evaluated at [facility] per pain management's protocol but she was unable to keep that appointment because she didn't have the copay  Patient did not bring her medications nor a list. Each medication was verbally reviewed with the patient and she was encouraged to bring the bottles to every visit to confirm accuracy of list.  Will not make a follow-up appointment at this time. Patient and her daughter advised to call back at anytime to schedule another appointment.

## 2017-11-17 ENCOUNTER — Ambulatory Visit: Payer: Medicare HMO | Attending: Family | Admitting: Family

## 2017-11-17 ENCOUNTER — Encounter: Payer: Self-pay | Admitting: Family

## 2017-11-17 VITALS — BP 126/65 | HR 64 | Resp 18 | Ht 59.0 in | Wt 165.2 lb

## 2017-11-17 DIAGNOSIS — G894 Chronic pain syndrome: Secondary | ICD-10-CM

## 2017-11-17 DIAGNOSIS — Z96651 Presence of right artificial knee joint: Secondary | ICD-10-CM | POA: Diagnosis not present

## 2017-11-17 DIAGNOSIS — Z96641 Presence of right artificial hip joint: Secondary | ICD-10-CM | POA: Insufficient documentation

## 2017-11-17 DIAGNOSIS — Z880 Allergy status to penicillin: Secondary | ICD-10-CM | POA: Diagnosis not present

## 2017-11-17 DIAGNOSIS — Z820 Family history of epilepsy and other diseases of the nervous system: Secondary | ICD-10-CM | POA: Insufficient documentation

## 2017-11-17 DIAGNOSIS — E1122 Type 2 diabetes mellitus with diabetic chronic kidney disease: Secondary | ICD-10-CM | POA: Diagnosis not present

## 2017-11-17 DIAGNOSIS — Z961 Presence of intraocular lens: Secondary | ICD-10-CM | POA: Insufficient documentation

## 2017-11-17 DIAGNOSIS — Z9842 Cataract extraction status, left eye: Secondary | ICD-10-CM | POA: Diagnosis not present

## 2017-11-17 DIAGNOSIS — Z886 Allergy status to analgesic agent status: Secondary | ICD-10-CM | POA: Diagnosis not present

## 2017-11-17 DIAGNOSIS — M1990 Unspecified osteoarthritis, unspecified site: Secondary | ICD-10-CM | POA: Insufficient documentation

## 2017-11-17 DIAGNOSIS — Z8049 Family history of malignant neoplasm of other genital organs: Secondary | ICD-10-CM | POA: Insufficient documentation

## 2017-11-17 DIAGNOSIS — Z950 Presence of cardiac pacemaker: Secondary | ICD-10-CM | POA: Insufficient documentation

## 2017-11-17 DIAGNOSIS — Z7983 Long term (current) use of bisphosphonates: Secondary | ICD-10-CM | POA: Insufficient documentation

## 2017-11-17 DIAGNOSIS — I5022 Chronic systolic (congestive) heart failure: Secondary | ICD-10-CM

## 2017-11-17 DIAGNOSIS — Z79899 Other long term (current) drug therapy: Secondary | ICD-10-CM | POA: Diagnosis not present

## 2017-11-17 DIAGNOSIS — Z90710 Acquired absence of both cervix and uterus: Secondary | ICD-10-CM | POA: Insufficient documentation

## 2017-11-17 DIAGNOSIS — I130 Hypertensive heart and chronic kidney disease with heart failure and stage 1 through stage 4 chronic kidney disease, or unspecified chronic kidney disease: Secondary | ICD-10-CM | POA: Diagnosis not present

## 2017-11-17 DIAGNOSIS — N189 Chronic kidney disease, unspecified: Secondary | ICD-10-CM | POA: Insufficient documentation

## 2017-11-17 DIAGNOSIS — I509 Heart failure, unspecified: Secondary | ICD-10-CM | POA: Diagnosis present

## 2017-11-17 DIAGNOSIS — Z8249 Family history of ischemic heart disease and other diseases of the circulatory system: Secondary | ICD-10-CM | POA: Diagnosis not present

## 2017-11-17 DIAGNOSIS — I10 Essential (primary) hypertension: Secondary | ICD-10-CM

## 2017-11-17 DIAGNOSIS — Z888 Allergy status to other drugs, medicaments and biological substances status: Secondary | ICD-10-CM | POA: Diagnosis not present

## 2017-11-17 DIAGNOSIS — Z794 Long term (current) use of insulin: Secondary | ICD-10-CM | POA: Diagnosis not present

## 2017-11-17 DIAGNOSIS — G8929 Other chronic pain: Secondary | ICD-10-CM | POA: Diagnosis not present

## 2017-11-17 DIAGNOSIS — E785 Hyperlipidemia, unspecified: Secondary | ICD-10-CM | POA: Insufficient documentation

## 2017-11-17 DIAGNOSIS — Z9841 Cataract extraction status, right eye: Secondary | ICD-10-CM | POA: Insufficient documentation

## 2017-11-17 DIAGNOSIS — Z951 Presence of aortocoronary bypass graft: Secondary | ICD-10-CM | POA: Insufficient documentation

## 2017-11-17 DIAGNOSIS — Z7901 Long term (current) use of anticoagulants: Secondary | ICD-10-CM | POA: Diagnosis not present

## 2017-11-17 DIAGNOSIS — F419 Anxiety disorder, unspecified: Secondary | ICD-10-CM | POA: Insufficient documentation

## 2017-11-17 DIAGNOSIS — I2510 Atherosclerotic heart disease of native coronary artery without angina pectoris: Secondary | ICD-10-CM | POA: Insufficient documentation

## 2017-11-17 DIAGNOSIS — I495 Sick sinus syndrome: Secondary | ICD-10-CM | POA: Diagnosis not present

## 2017-11-17 NOTE — Patient Instructions (Signed)
Continue weighing daily and call for an overnight weight gain of > 2 pounds or a weekly weight gain of >5 pounds. 

## 2017-11-22 ENCOUNTER — Other Ambulatory Visit: Payer: Self-pay

## 2017-11-22 ENCOUNTER — Ambulatory Visit
Admission: EM | Admit: 2017-11-22 | Discharge: 2017-11-22 | Disposition: A | Discharge status: Home / Self Care | Payer: Medicare HMO | Attending: Family Medicine | Admitting: Family Medicine

## 2017-11-22 DIAGNOSIS — N3001 Acute cystitis with hematuria: Secondary | ICD-10-CM

## 2017-11-22 DIAGNOSIS — R350 Frequency of micturition: Secondary | ICD-10-CM | POA: Diagnosis not present

## 2017-11-22 DIAGNOSIS — R310 Gross hematuria: Secondary | ICD-10-CM | POA: Diagnosis not present

## 2017-11-22 LAB — URINALYSIS, COMPLETE (UACMP) WITH MICROSCOPIC: BACTERIA UA: NONE SEEN

## 2017-11-22 MED ORDER — CEPHALEXIN 500 MG PO CAPS: 500.0000 mg | ORAL_CAPSULE | Freq: Two times a day (BID) | ORAL | 0 refills | Status: DC

## 2017-11-22 NOTE — ED Provider Notes (Signed)
[facility]   CSN: [id number] Arrival date & time: 11/22/17  1409  History   Chief Complaint Blood with urination  HPI  84 year old female presents with hematuria.  Patient reports that yesterday she went to the restroom and had blood with urination.  She states that she is been having urinary frequency and urgency.  She reports chronic low back pain but no increasing pain.  No flank pain.  No nausea vomiting.  No reports of dysuria.  She states that she is taken a dose of Azo with some improvement.  No known exacerbating factors.  No other associated symptoms.  No other complaints.  Past Medical History:  Diagnosis Date  . Anemia   . Anxiety   . Arthritis   . Benign neoplasm of colon   . CHF (congestive heart failure) ([place])   . Chronic kidney disease   . Chronic pain   . Complication of anesthesia 1980's   hard time waking up  . Coronary artery disease with unspecified angina pectoris   . Cough   . Diabetes mellitus without complication ([place])   . Essential hypertension   . High risk medication use   . Hyperlipidemia   . Plantar fascial fibromatosis   . Presence of permanent cardiac pacemaker   . Shortness of breath dyspnea     Patient Active Problem List   Diagnosis Date Noted  . Chronic pain syndrome 04/09/2017  . Other specified health status 04/08/2017  . Disorder of bone, unspecified 04/08/2017  . Long term current use of opiate analgesic 04/08/2017  . Chronic low back pain (Primary Area of Pain) (Bilateral) (L>R) 04/08/2017  . Chronic pain of both lower extremities  (Tertiary Area of Pain) 04/08/2017  . Chronic groin pain (Secondary Area of Pain) 04/08/2017  . Chronic neck pain (Fourth Area of Pain) (Bilateral) (L>R) 04/08/2017  . Chronic sacroiliac joint pain 04/08/2017  . Other long term (current) drug therapy 04/08/2017  . Syncope 02/09/2017  . Asthmatic bronchitis 01/26/2017  . Fall 10/06/2016  . Anxiety 08/08/2016  . TIA (transient ischemic  attack) 07/25/2015  . Sick sinus syndrome ([place]) 03/14/2015  . Chronic heart failure ([place]) 01/05/2015  . Fracture dislocation of ankle 07/27/2014  . IDDM (insulin dependent diabetes mellitus) ([place]) 07/27/2014  . Essential hypertension 07/27/2014  . Multiple rib fractures 07/27/2014  . 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 ABDOMINAL HYSTERECTOMY    . BACK SURGERY  1960's   cage and screws in lower back  . CATARACT EXTRACTION W/ INTRAOCULAR LENS  IMPLANT, BILATERAL Bilateral   . CORONARY ARTERY BYPASS GRAFT  2008  . JOINT REPLACEMENT Right    hip and knee  . ORIF ANKLE FRACTURE Right 07/27/2014   Procedure: OPEN REDUCTION INTERNAL FIXATION (ORIF) ANKLE FRACTURE;  Surgeon: Alta Corning, MD;  Location: [facility];  Service: Orthopedics;  Laterality: Right;  . PACEMAKER INSERTION Left 03/14/2015   Procedure: INSERTION PACEMAKER;  Surgeon: Isaias Cowman, MD;  Location: [facility];  Service: Cardiovascular;  Laterality: Left;  . ROTATOR CUFF REPAIR    . TOTAL HIP ARTHROPLASTY Right   . TOTAL KNEE ARTHROPLASTY Right   . VEIN LIGATION AND STRIPPING      OB History   None      Home Medications    Prior to Admission medications   Medication Sig Start Date End Date Taking? Authorizing Provider  albuterol (PROVENTIL HFA;VENTOLIN HFA) 108 (90 Base) MCG/ACT inhaler Inhale 2  puffs into the lungs every 6 (six) hours as needed for wheezing or shortness of breath. 01/28/17   Loletha Grayer, MD  alendronate (FOSAMAX) 70 MG tablet Take 70 mg by mouth every Thursday.     [provider]  ALPRAZolam Duanne Moron) 1 MG tablet Take 1 tablet (1 mg total) by mouth at bedtime as needed for anxiety. Patient taking differently: Take 0.5 mg by mouth at bedtime as needed for anxiety.  07/31/14   Elgergawy, Silver Huguenin, MD  amLODipine (NORVASC) 10 MG tablet Take 5 mg by mouth daily.     [provider]  apixaban (ELIQUIS) 5 MG TABS tablet Take 5 mg  by mouth 2 (two) times daily.    [provider]  calcium carbonate (OSCAL) 1500 (600 Ca) MG TABS tablet Take 600 mg of elemental calcium by mouth daily with breakfast.    [provider]  carvedilol (COREG) 6.25 MG tablet Take 1 tablet (6.25 mg total) by mouth 2 (two) times daily with a meal. 01/28/17   Leslye Peer, Richard, MD  cephALEXin (KEFLEX) 500 MG capsule Take 1 capsule (500 mg total) by mouth 2 (two) times daily. 11/22/17   Coral Spikes, DO  furosemide (LASIX) 20 MG tablet TAKE ONE TABLET BY MOUTH ONCE DAILY 10/21/17   Darylene Price A, FNP  gabapentin (NEURONTIN) 100 MG capsule Take 200 mg by mouth 3 (three) times daily.     [provider]  insulin aspart protamine- aspart (NOVOLOG MIX 70/30) (70-30) 100 UNIT/ML injection Inject 0.15 mLs (15 Units total) into the skin 2 (two) times daily with a meal. 01/28/17   Leslye Peer, Richard, MD  Insulin NPH Isophane & Regular (NOVOLIN 70/30 [place]) Inject 10 Units into the skin 2 (two) times daily. prn    [provider]  ipratropium-albuterol (DUONEB) 0.5-2.5 (3) MG/3ML SOLN Inhale 3 mLs into the lungs 4 (four) times daily. 02/06/17 02/01/18  [provider]  levETIRAcetam (KEPPRA) 500 MG tablet Take 1 tablet by mouth 2 (two) times daily. 03/25/17 11/17/17  [provider]  Menthol-Camphor (ICY HOT ARTHRITIS PAIN RELIEF) 16-4 % LOTN Apply 1 application topically 2 (two) times daily as needed. 06/03/17   Laban Emperor, PA-C  Multiple Vitamins-Minerals (PRESERVISION AREDS 2) CAPS Take 1 capsule by mouth daily.    [provider]  omeprazole (PRILOSEC) 20 MG capsule Take 20 mg by mouth daily.    [provider]  pravastatin (PRAVACHOL) 40 MG tablet Take 40 mg by mouth at bedtime.    [provider]  sacubitril-valsartan (ENTRESTO) 24-26 MG Take 1 tablet by mouth 2 (two) times daily.    [provider]    Family History Family History  Problem Relation Age of Onset  . Heart  attack Mother   . Heart disease Father   . Alzheimer's disease Sister   . Cervical cancer Sister   . Heart failure Son     Social History Social History   Tobacco Use  . Smoking status: Never Smoker  . Smokeless tobacco: Never Used  Substance Use Topics  . Alcohol use: No  . Drug use: No     Allergies   Ambien [zolpidem tartrate]; Etodolac; Iodine; Nsaids; Penicillins; and Succinylcholine   Review of Systems Review of Systems  Constitutional: Negative.   Genitourinary: Positive for frequency, hematuria and urgency. Negative for flank pain.   Physical Exam Triage Vital Signs ED Triage Vitals  Enc Vitals Group     BP 11/22/17 1423 110/73  Pulse Rate 11/22/17 1423 61     Resp 11/22/17 1423 18     Temp 11/22/17 1423 98.4 F (36.9 C)     Temp Source 11/22/17 1423 Oral     SpO2 11/22/17 1423 93 %     Weight 11/22/17 1426 165 lb (74.8 kg)     Height 11/22/17 1426 4\' 11"  (1.499 m)     Head Circumference --      Peak Flow --      Pain Score 11/22/17 1426 5     Pain Loc --      Pain Edu? --      Excl. in [place]? --    Updated Vital Signs BP 110/73 (BP Location: Right Arm)   Pulse 61   Temp 98.4 F (36.9 C) (Oral)   Resp 18   Ht 4\' 11"  (1.499 m)   Wt 165 lb (74.8 kg)   SpO2 93%   BMI 33.33 kg/m   Physical Exam  Constitutional: She is oriented to person, place, and time. She appears well-developed. No distress.  Cardiovascular: Normal rate and regular rhythm.  Pulmonary/Chest: Effort normal and breath sounds normal. She has no wheezes. She has no rales.  Abdominal: Soft. She exhibits no distension. There is no tenderness.  Neurological: She is alert and oriented to person, place, and time.  Psychiatric: She has a normal mood and affect. Her behavior is normal.  Nursing note and vitals reviewed.   UC Treatments / Results  Labs (all labs ordered are listed, but only abnormal results are displayed) Labs Reviewed  URINALYSIS, COMPLETE (UACMP) WITH MICROSCOPIC  - Abnormal; Notable for the following components:      Result Value   Color, Urine AMBER (*)    APPearance CLOUDY (*)    Glucose, UA   (*)    Value: TEST NOT REPORTED DUE TO COLOR INTERFERENCE OF URINE PIGMENT   Hgb urine dipstick   (*)    Value: TEST NOT REPORTED DUE TO COLOR INTERFERENCE OF URINE PIGMENT   Bilirubin Urine   (*)    Value: TEST NOT REPORTED DUE TO COLOR INTERFERENCE OF URINE PIGMENT   Ketones, ur   (*)    Value: TEST NOT REPORTED DUE TO COLOR INTERFERENCE OF URINE PIGMENT   Protein, ur   (*)    Value: TEST NOT REPORTED DUE TO COLOR INTERFERENCE OF URINE PIGMENT   Nitrite   (*)    Value: TEST NOT REPORTED DUE TO COLOR INTERFERENCE OF URINE PIGMENT   Leukocytes, UA   (*)    Value: TEST NOT REPORTED DUE TO COLOR INTERFERENCE OF URINE PIGMENT   All other components within normal limits  URINE CULTURE    EKG None  Radiology No results found.  Procedures Procedures (including critical care time)  Medications Ordered in UC Medications - No data to display  Initial Impression / Assessment and Plan / UC Course  I have reviewed the triage vital signs and the nursing notes.  Pertinent labs & imaging results that were available during my care of the patient were reviewed by me and considered in my medical decision making (see chart for details).    84 year old female presents with gross hematuria, urinary frequency and urgency.  She is a non-smoker.  Low suspicion for underlying malignancy at this point.  She is status post hysterectomy.  Urinalysis revealed hematuria and pyuria.  Sending culture.  Placing on antibiotic for suspected UTI.  Final Clinical Impressions(s) / UC Diagnoses   Final  diagnoses:  Gross hematuria  Acute cystitis with hematuria     Discharge Instructions     Antibiotic as prescribed.  If persists, please get re-evaluated.  Take care  Dr. Lacinda Axon    ED Prescriptions    Medication Sig Dispense Auth. Provider   cephALEXin (KEFLEX)  500 MG capsule Take 1 capsule (500 mg total) by mouth 2 (two) times daily. 14 capsule Coral Spikes, DO     Controlled Substance Prescriptions [place] Controlled Substance Registry consulted? Not Applicable   Coral Spikes, DO 11/22/17 1527

## 2017-11-22 NOTE — ED Triage Notes (Signed)
Pt reports she started with vaginal bleeding yesterday and used 3 pads and got onto her clothes. Not really pain, but some low back discomfort. She doesn't think it was coming from her urine but not completely sure.

## 2017-11-22 NOTE — Discharge Instructions (Signed)
Antibiotic as prescribed.  If persists, please get re-evaluated.  Take care  Dr. Lacinda Axon

## 2017-11-24 LAB — URINE CULTURE: CULTURE: NO GROWTH

## 2017-12-07 DIAGNOSIS — Z79899 Other long term (current) drug therapy: Secondary | ICD-10-CM | POA: Diagnosis not present

## 2017-12-07 DIAGNOSIS — Z0000 Encounter for general adult medical examination without abnormal findings: Secondary | ICD-10-CM | POA: Diagnosis not present

## 2017-12-07 DIAGNOSIS — I5022 Chronic systolic (congestive) heart failure: Secondary | ICD-10-CM | POA: Diagnosis not present

## 2017-12-07 DIAGNOSIS — I10 Essential (primary) hypertension: Secondary | ICD-10-CM | POA: Diagnosis not present

## 2017-12-07 DIAGNOSIS — M25552 Pain in left hip: Secondary | ICD-10-CM | POA: Diagnosis not present

## 2017-12-07 DIAGNOSIS — E782 Mixed hyperlipidemia: Secondary | ICD-10-CM | POA: Diagnosis not present

## 2017-12-07 DIAGNOSIS — E119 Type 2 diabetes mellitus without complications: Secondary | ICD-10-CM | POA: Diagnosis not present

## 2017-12-07 DIAGNOSIS — R8290 Unspecified abnormal findings in urine: Secondary | ICD-10-CM | POA: Diagnosis not present

## 2017-12-07 DIAGNOSIS — Z794 Long term (current) use of insulin: Secondary | ICD-10-CM | POA: Diagnosis not present

## 2017-12-07 DIAGNOSIS — G894 Chronic pain syndrome: Secondary | ICD-10-CM | POA: Diagnosis not present

## 2017-12-08 ENCOUNTER — Emergency Department
Admission: EM | Admit: 2017-12-08 | Discharge: 2017-12-08 | Disposition: A | Discharge status: Home / Self Care | Payer: Medicare HMO | Attending: Emergency Medicine | Admitting: Emergency Medicine

## 2017-12-08 ENCOUNTER — Emergency Department: Payer: Medicare HMO

## 2017-12-08 ENCOUNTER — Encounter: Payer: Self-pay | Admitting: Emergency Medicine

## 2017-12-08 ENCOUNTER — Other Ambulatory Visit: Payer: Self-pay

## 2017-12-08 DIAGNOSIS — M545 Low back pain, unspecified: Secondary | ICD-10-CM

## 2017-12-08 DIAGNOSIS — I130 Hypertensive heart and chronic kidney disease with heart failure and stage 1 through stage 4 chronic kidney disease, or unspecified chronic kidney disease: Secondary | ICD-10-CM | POA: Diagnosis not present

## 2017-12-08 DIAGNOSIS — I509 Heart failure, unspecified: Secondary | ICD-10-CM | POA: Diagnosis not present

## 2017-12-08 DIAGNOSIS — Z951 Presence of aortocoronary bypass graft: Secondary | ICD-10-CM | POA: Diagnosis not present

## 2017-12-08 DIAGNOSIS — N189 Chronic kidney disease, unspecified: Secondary | ICD-10-CM | POA: Insufficient documentation

## 2017-12-08 DIAGNOSIS — Z96651 Presence of right artificial knee joint: Secondary | ICD-10-CM | POA: Diagnosis not present

## 2017-12-08 DIAGNOSIS — G8929 Other chronic pain: Secondary | ICD-10-CM | POA: Diagnosis not present

## 2017-12-08 DIAGNOSIS — Z79899 Other long term (current) drug therapy: Secondary | ICD-10-CM | POA: Diagnosis not present

## 2017-12-08 DIAGNOSIS — E1122 Type 2 diabetes mellitus with diabetic chronic kidney disease: Secondary | ICD-10-CM | POA: Insufficient documentation

## 2017-12-08 DIAGNOSIS — Z794 Long term (current) use of insulin: Secondary | ICD-10-CM | POA: Diagnosis not present

## 2017-12-08 DIAGNOSIS — M25552 Pain in left hip: Secondary | ICD-10-CM | POA: Diagnosis not present

## 2017-12-08 DIAGNOSIS — Z7901 Long term (current) use of anticoagulants: Secondary | ICD-10-CM | POA: Diagnosis not present

## 2017-12-08 DIAGNOSIS — Z8673 Personal history of transient ischemic attack (TIA), and cerebral infarction without residual deficits: Secondary | ICD-10-CM | POA: Insufficient documentation

## 2017-12-08 DIAGNOSIS — Z7902 Long term (current) use of antithrombotics/antiplatelets: Secondary | ICD-10-CM | POA: Insufficient documentation

## 2017-12-08 DIAGNOSIS — M79605 Pain in left leg: Secondary | ICD-10-CM | POA: Diagnosis not present

## 2017-12-08 DIAGNOSIS — Z96641 Presence of right artificial hip joint: Secondary | ICD-10-CM | POA: Diagnosis not present

## 2017-12-08 LAB — COMPREHENSIVE METABOLIC PANEL
ALBUMIN: 3.7 g/dL (ref 3.5–5.0)
ALK PHOS: 57 U/L (ref 38–126)
ALT: 9 U/L — AB (ref 14–54)
AST: 20 U/L (ref 15–41)
Anion gap: 12 (ref 5–15)
BUN: 30 mg/dL — ABNORMAL HIGH (ref 6–20)
CALCIUM: 9.8 mg/dL (ref 8.9–10.3)
CO2: 23 mmol/L (ref 22–32)
CREATININE: 0.86 mg/dL (ref 0.44–1.00)
Chloride: 101 mmol/L (ref 101–111)
GFR calc non Af Amer: 60 mL/min — ABNORMAL LOW (ref >60)
GLUCOSE: 139 mg/dL — AB (ref 65–99)
Potassium: 4.1 mmol/L (ref 3.5–5.1)
SODIUM: 136 mmol/L (ref 135–145)
Total Bilirubin: 0.7 mg/dL (ref 0.3–1.2)
Total Protein: 7.2 g/dL (ref 6.5–8.1)

## 2017-12-08 LAB — CBC WITH DIFFERENTIAL/PLATELET
BASOS PCT: 1 %
Basophils Absolute: 0.1 10*3/uL (ref 0–0.1)
Eosinophils Absolute: 0 10*3/uL (ref 0–0.7)
Eosinophils Relative: 1 %
HEMATOCRIT: 38.3 % (ref 35.0–47.0)
HEMOGLOBIN: 13 g/dL (ref 12.0–16.0)
LYMPHS PCT: 8 %
Lymphs Abs: 0.7 10*3/uL — ABNORMAL LOW (ref 1.0–3.6)
MCH: 29.6 pg (ref 26.0–34.0)
MCHC: 34.1 g/dL (ref 32.0–36.0)
MCV: 86.8 fL (ref 80.0–100.0)
MONOS PCT: 14 %
Monocytes Absolute: 1.2 10*3/uL — ABNORMAL HIGH (ref 0.2–0.9)
NEUTROS ABS: 6.5 10*3/uL (ref 1.4–6.5)
NEUTROS PCT: 76 %
Platelets: 244 10*3/uL (ref 150–440)
RBC: 4.41 MIL/uL (ref 3.80–5.20)
RDW: 16.4 % — ABNORMAL HIGH (ref 11.5–14.5)
WBC: 8.4 10*3/uL (ref 3.6–11.0)

## 2017-12-08 LAB — GLUCOSE, CAPILLARY: Glucose-Capillary: 169 mg/dL — ABNORMAL HIGH (ref 65–99)

## 2017-12-08 LAB — SEDIMENTATION RATE: Sed Rate: 44 mm/hr — ABNORMAL HIGH (ref 0–30)

## 2017-12-08 MED ORDER — OXYCODONE-ACETAMINOPHEN 5-325 MG PO TABS
1.0000 | ORAL_TABLET | Freq: Once | ORAL | Status: AC
Start: 2017-12-08 — End: 2017-12-08
  Administered 2017-12-08: 1 via ORAL
  Filled 2017-12-08: qty 1

## 2017-12-08 MED ORDER — OXYCODONE-ACETAMINOPHEN 5-325 MG PO TABS
1.0000 | ORAL_TABLET | Freq: Once | ORAL | Status: AC
  Administered 2017-12-08: 1 via ORAL
  Filled 2017-12-08: qty 1

## 2017-12-08 MED ORDER — TRAMADOL HCL 50 MG PO TABS: 50.0000 mg | ORAL_TABLET | Freq: Four times a day (QID) | ORAL | 0 refills | Status: DC | PRN

## 2017-12-08 NOTE — ED Notes (Signed)
See triage note  Presents with pain to left hip and leg  States she was dx'd with arthritis  States she is waiting to see ortho next week  States she is not able to sleep d/t increased pain

## 2017-12-08 NOTE — ED Triage Notes (Signed)
Pt arrived via POV with reports of left hip and leg pain. Pt has hx of arthritis, was seen by Dr. Doy Hutching yesterday and was referred to orthopedics for a shot and has appointment next Wednesday.  Pt states the pain has been worse today.  Pt denies any falls or injuries and was diagnosed with arthritis. Pt has taken BCs at home-last dose was 1 hour ago.

## 2017-12-08 NOTE — Discharge Instructions (Addendum)
Follow-up with your regular doctor and the orthopedic doctor that you have an appointment with next week.  Apply ice to the left hip.  Use your walker when you try to bear weight.  Use the tramadol for pain as needed.  He can also take Tylenol or ibuprofen.

## 2017-12-08 NOTE — ED Provider Notes (Signed)
[facility] Emergency Department Provider Note  ____________________________________________   First MD Initiated Contact with Patient 12/08/17 1755     (approximate)  I have reviewed the triage vital signs and the nursing notes.   HISTORY  Chief Complaint Hip Pain and Leg Pain    HPI Amber [name] is a 84 y.o. female presents emergency department complaining of left hip and leg pain.  States she has a history of arthritis and was seen by Dr. Doy Hutching yesterday who referred her to orthopedics.  She states that the pain has gotten much worse that she cannot bear weight on the left leg.  She states her lower back also hurts.  Her family states that she is been unable to move from the chair to the bed without help.  She does have a walker and a wheelchair at home.  SHe has been taking BCs.  She does have a pacemaker.  Past Medical History:  Diagnosis Date  . Anemia   . Anxiety   . Arthritis   . Benign neoplasm of colon   . CHF (congestive heart failure) ([place])   . Chronic kidney disease   . Chronic pain   . Complication of anesthesia 1980's   hard time waking up  . Coronary artery disease with unspecified angina pectoris   . Cough   . Diabetes mellitus without complication ([place])   . Essential hypertension   . High risk medication use   . Hyperlipidemia   . Plantar fascial fibromatosis   . Presence of permanent cardiac pacemaker   . Shortness of breath dyspnea     Patient Active Problem List   Diagnosis Date Noted  . Chronic pain syndrome 04/09/2017  . Other specified health status 04/08/2017  . Disorder of bone, unspecified 04/08/2017  . Long term current use of opiate analgesic 04/08/2017  . Chronic low back pain (Primary Area of Pain) (Bilateral) (L>R) 04/08/2017  . Chronic pain of both lower extremities  (Tertiary Area of Pain) 04/08/2017  . Chronic groin pain (Secondary Area of Pain) 04/08/2017  . Chronic neck pain (Fourth Area of Pain)  (Bilateral) (L>R) 04/08/2017  . Chronic sacroiliac joint pain 04/08/2017  . Other long term (current) drug therapy 04/08/2017  . Syncope 02/09/2017  . Asthmatic bronchitis 01/26/2017  . Fall 10/06/2016  . Anxiety 08/08/2016  . TIA (transient ischemic attack) 07/25/2015  . Sick sinus syndrome ([place]) 03/14/2015  . Chronic heart failure ([place]) 01/05/2015  . Fracture dislocation of ankle 07/27/2014  . IDDM (insulin dependent diabetes mellitus) ([place]) 07/27/2014  . Essential hypertension 07/27/2014  . Multiple rib fractures 07/27/2014  . 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 ABDOMINAL HYSTERECTOMY    . BACK SURGERY  1960's   cage and screws in lower back  . CATARACT EXTRACTION W/ INTRAOCULAR LENS  IMPLANT, BILATERAL Bilateral   . CORONARY ARTERY BYPASS GRAFT  2008  . JOINT REPLACEMENT Right    hip and knee  . ORIF ANKLE FRACTURE Right 07/27/2014   Procedure: OPEN REDUCTION INTERNAL FIXATION (ORIF) ANKLE FRACTURE;  Surgeon: Alta Corning, MD;  Location: [facility];  Service: Orthopedics;  Laterality: Right;  . PACEMAKER INSERTION Left 03/14/2015   Procedure: INSERTION PACEMAKER;  Surgeon: Isaias Cowman, MD;  Location: [facility];  Service: Cardiovascular;  Laterality: Left;  . ROTATOR CUFF REPAIR    . TOTAL HIP ARTHROPLASTY Right   . TOTAL KNEE ARTHROPLASTY Right   .  VEIN LIGATION AND STRIPPING      Prior to Admission medications   Medication Sig Start Date End Date Taking? Authorizing Provider  albuterol (PROVENTIL HFA;VENTOLIN HFA) 108 (90 Base) MCG/ACT inhaler Inhale 2 puffs into the lungs every 6 (six) hours as needed for wheezing or shortness of breath. 01/28/17   Loletha Grayer, MD  alendronate (FOSAMAX) 70 MG tablet Take 70 mg by mouth every Thursday.     [provider]  ALPRAZolam Duanne Moron) 1 MG tablet Take 1 tablet (1 mg total) by mouth at bedtime as needed for anxiety. Patient taking differently: Take 0.5 mg by mouth at  bedtime as needed for anxiety.  07/31/14   Elgergawy, Silver Huguenin, MD  amLODipine (NORVASC) 10 MG tablet Take 5 mg by mouth daily.     [provider]  apixaban (ELIQUIS) 5 MG TABS tablet Take 5 mg by mouth 2 (two) times daily.    [provider]  calcium carbonate (OSCAL) 1500 (600 Ca) MG TABS tablet Take 600 mg of elemental calcium by mouth daily with breakfast.    [provider]  carvedilol (COREG) 6.25 MG tablet Take 1 tablet (6.25 mg total) by mouth 2 (two) times daily with a meal. 01/28/17   Leslye Peer, Richard, MD  cephALEXin (KEFLEX) 500 MG capsule Take 1 capsule (500 mg total) by mouth 2 (two) times daily. 11/22/17   Coral Spikes, DO  furosemide (LASIX) 20 MG tablet TAKE ONE TABLET BY MOUTH ONCE DAILY 10/21/17   Darylene Price A, FNP  gabapentin (NEURONTIN) 100 MG capsule Take 200 mg by mouth 3 (three) times daily.     [provider]  insulin aspart protamine- aspart (NOVOLOG MIX 70/30) (70-30) 100 UNIT/ML injection Inject 0.15 mLs (15 Units total) into the skin 2 (two) times daily with a meal. 01/28/17   Leslye Peer, Richard, MD  Insulin NPH Isophane & Regular (NOVOLIN 70/30 [place]) Inject 10 Units into the skin 2 (two) times daily. prn    [provider]  ipratropium-albuterol (DUONEB) 0.5-2.5 (3) MG/3ML SOLN Inhale 3 mLs into the lungs 4 (four) times daily. 02/06/17 02/01/18  [provider]  levETIRAcetam (KEPPRA) 500 MG tablet Take 1 tablet by mouth 2 (two) times daily. 03/25/17 11/17/17  [provider]  Menthol-Camphor (ICY HOT ARTHRITIS PAIN RELIEF) 16-4 % LOTN Apply 1 application topically 2 (two) times daily as needed. 06/03/17   Laban Emperor, PA-C  Multiple Vitamins-Minerals (PRESERVISION AREDS 2) CAPS Take 1 capsule by mouth daily.    [provider]  omeprazole (PRILOSEC) 20 MG capsule Take 20 mg by mouth daily.    [provider]  pravastatin (PRAVACHOL) 40 MG tablet Take 40 mg by mouth at bedtime.    [provider]  sacubitril-valsartan (ENTRESTO) 24-26 MG Take 1 tablet by mouth 2 (two) times daily.    [provider]  traMADol (ULTRAM) 50 MG tablet Take 1 tablet (50 mg total) by mouth every 6 (six) hours as needed. 12/08/17   Caryn Section Linden Dolin, PA-C    Allergies Ambien [zolpidem tartrate]; Etodolac; Iodine; Nsaids; Penicillins; and Succinylcholine  Family History  Problem Relation Age of Onset  . Heart attack Mother   . Heart disease Father   . Alzheimer's disease Sister   . Cervical cancer Sister   . Heart failure Son     Social History Social History   Tobacco Use  . Smoking status: Never Smoker  . Smokeless tobacco: Never Used  Substance Use Topics  . Alcohol use:  No  . Drug use: No    Review of Systems  Constitutional: No fever/chills Eyes: No visual changes. ENT: No sore throat. Respiratory: Denies cough Genitourinary: Negative for dysuria. Musculoskeletal: Positive for left hip and for back pain. Skin: Negative for rash.    ____________________________________________   PHYSICAL EXAM:  VITAL SIGNS: ED Triage Vitals  Enc Vitals Group     BP 12/08/17 1736 118/66     Pulse Rate 12/08/17 1736 60     Resp 12/08/17 1736 18     Temp 12/08/17 1736 98.5 F (36.9 C)     Temp Source 12/08/17 1736 Oral     SpO2 12/08/17 1736 94 %     Weight 12/08/17 1737 165 lb (74.8 kg)     Height 12/08/17 1737 4' 11" (1.499 m)     Head Circumference --      Peak Flow --      Pain Score 12/08/17 1737 5     Pain Loc --      Pain Edu? --      Excl. in [place]? --     Constitutional: Alert and oriented. Well appearing and in no acute distress. Eyes: Conjunctivae are normal.  Head: Atraumatic. Nose: No congestion/rhinnorhea. Mouth/Throat: Mucous membranes are moist.   Cardiovascular: Normal rate, regular rhythm.  Heart sounds normal Respiratory: Normal respiratory effort.  No retractions, lungs clear to all station GU: deferred Musculoskeletal: Left hip and lumbar  spine are tender to palpation.  Patient is unable to bear weight on the left side.  She has decreased range of motion of the left hip secondary to pain.  She is neurovascularly intact.  Legs are of equal length.   Neurologic:  Normal speech and language.  Skin:  Skin is warm, dry and intact. No rash noted. Psychiatric: Mood and affect are normal. Speech and behavior are normal.  ____________________________________________   LABS (all labs ordered are listed, but only abnormal results are displayed)  Labs Reviewed  GLUCOSE, CAPILLARY - Abnormal; Notable for the following components:      Result Value   Glucose-Capillary 169 (*)    All other components within normal limits  CBC WITH DIFFERENTIAL/PLATELET - Abnormal; Notable for the following components:   RDW 16.4 (*)    Lymphs Abs 0.7 (*)    Monocytes Absolute 1.2 (*)    All other components within normal limits  SEDIMENTATION RATE - Abnormal; Notable for the following components:   Sed Rate 44 (*)    All other components within normal limits  COMPREHENSIVE METABOLIC PANEL - Abnormal; Notable for the following components:   Glucose, Bld 139 (*)    BUN 30 (*)    ALT 9 (*)    GFR calc non Af Amer 60 (*)    All other components within normal limits  CBG MONITORING, ED   ____________________________________________   ____________________________________________  RADIOLOGY  CT of the left hip and lumbar spine show degenerative changes, no fractures, no AVN  ____________________________________________   PROCEDURES  Procedure(s) performed: No  Procedures    ____________________________________________   INITIAL IMPRESSION / ASSESSMENT AND PLAN / ED COURSE  Pertinent labs & imaging results that were available during my care of the patient were reviewed by me and considered in my medical decision making (see chart for details).  Patient is an 84 year old female presents emergency department complaining of lumbar  spine pain and left hip pain.  She denies any falls or injuries.  She states been difficult to bear weight  due to the pain.  She saw her regular doctor and was referred to orthopedics but cannot be seen until next Wednesday.  She denies fever or chills.  On physical exam the patient is mildly uncomfortable.  She has difficulty standing when going from the wheelchair to the stretcher.  Legs are of equal length.  The lumbar spine and left hip are tender to palpation.  CT of the left hip and lumbar spine show degenerative changes but no acute abnormalities.  The CBC is normal, met C is normal, sed rate is elevated  Explained the CT results and the lab results to the patient and her family.  The patient states she did have relief with the Percocet that we gave her while she was here in the ED.  We gave her another Percocet just prior to discharge and hopes this will control the pain overnight.  She was given a prescription for tramadol.  Strict instructions were given to the family that she is to use the walker when trying to stand due to the narcotics as this may make her dizzy or unsteady.  It will increase her chance of falls.  They state they understand and will help her throughout the day.  Also instructed them that if she is unable to wait until Wednesday of next week to be seen at [facility] orth[facility].  They state they understand and may use the walk-in clinic.  Patient was discharged in stable condition in the care of her family.     As part of my medical decision making, I reviewed the following data within the [place] notes reviewed and incorporated, Labs reviewed as noted above, Old chart reviewed, Radiograph reviewed CT of the lumbar spine and left hip are negative for acute abnormality, Notes from prior ED visits and [place] Controlled Substance Database  ____________________________________________   FINAL  CLINICAL IMPRESSION(S) / ED DIAGNOSES  Final diagnoses:  Left hip pain  Chronic midline low back pain without sciatica      NEW MEDICATIONS STARTED DURING THIS VISIT:  Discharge Medication List as of 12/08/2017  9:18 PM    START taking these medications   Details  traMADol (ULTRAM) 50 MG tablet Take 1 tablet (50 mg total) by mouth every 6 (six) hours as needed., Starting Tue 12/08/2017, Print         Note:  This document was prepared using Dragon voice recognition software and may include unintentional dictation errors.    Versie Starks, PA-C 12/08/17 2150    Carrie Mew, MD 12/09/17 [phone]

## 2017-12-22 ENCOUNTER — Other Ambulatory Visit: Payer: Self-pay

## 2017-12-22 ENCOUNTER — Ambulatory Visit
Admission: EM | Admit: 2017-12-22 | Discharge: 2017-12-22 | Disposition: A | Discharge status: Home / Self Care | Payer: Medicare HMO | Attending: Emergency Medicine | Admitting: Emergency Medicine

## 2017-12-22 ENCOUNTER — Encounter: Payer: Self-pay | Admitting: Emergency Medicine

## 2017-12-22 DIAGNOSIS — R310 Gross hematuria: Secondary | ICD-10-CM | POA: Diagnosis not present

## 2017-12-22 LAB — COMPREHENSIVE METABOLIC PANEL
ALBUMIN: 4.1 g/dL (ref 3.5–5.0)
ALT: 12 U/L — ABNORMAL LOW (ref 14–54)
AST: 19 U/L (ref 15–41)
Alkaline Phosphatase: 66 U/L (ref 38–126)
Anion gap: 15 (ref 5–15)
BUN: 24 mg/dL — AB (ref 6–20)
CHLORIDE: 99 mmol/L — AB (ref 101–111)
CO2: 22 mmol/L (ref 22–32)
CREATININE: 0.9 mg/dL (ref 0.44–1.00)
Calcium: 9.5 mg/dL (ref 8.9–10.3)
GFR calc Af Amer: >60 mL/min (ref >60)
GFR calc non Af Amer: 57 mL/min — ABNORMAL LOW (ref >60)
GLUCOSE: 139 mg/dL — AB (ref 65–99)
POTASSIUM: 4 mmol/L (ref 3.5–5.1)
Sodium: 136 mmol/L (ref 135–145)
Total Bilirubin: 1.1 mg/dL (ref 0.3–1.2)
Total Protein: 7.8 g/dL (ref 6.5–8.1)

## 2017-12-22 LAB — CBC WITH DIFFERENTIAL/PLATELET
BASOS ABS: 0.1 10*3/uL (ref 0–0.1)
Basophils Relative: 1 %
EOS PCT: 1 %
Eosinophils Absolute: 0.1 10*3/uL (ref 0–0.7)
HCT: 41.9 % (ref 35.0–47.0)
Hemoglobin: 13.9 g/dL (ref 12.0–16.0)
LYMPHS PCT: 8 %
Lymphs Abs: 1.3 10*3/uL (ref 1.0–3.6)
MCH: 28.6 pg (ref 26.0–34.0)
MCHC: 33.1 g/dL (ref 32.0–36.0)
MCV: 86.4 fL (ref 80.0–100.0)
MONO ABS: 1.7 10*3/uL — AB (ref 0.2–0.9)
Monocytes Relative: 11 %
NEUTROS ABS: 13.1 10*3/uL — AB (ref 1.4–6.5)
Neutrophils Relative %: 81 %
PLATELETS: 272 10*3/uL (ref 150–440)
RBC: 4.85 MIL/uL (ref 3.80–5.20)
RDW: 16.6 % — AB (ref 11.5–14.5)
WBC: 16.2 10*3/uL — ABNORMAL HIGH (ref 3.6–11.0)

## 2017-12-22 LAB — URINALYSIS, COMPLETE (UACMP) WITH MICROSCOPIC
BACTERIA UA: NONE SEEN
GLUCOSE, UA: NEGATIVE mg/dL
KETONES UR: 15 mg/dL — AB
Leukocytes, UA: NEGATIVE
Nitrite: NEGATIVE
PROTEIN: 100 mg/dL — AB
RBC / HPF: >50 RBC/hpf (ref 0–5)
Specific Gravity, Urine: >1.03 — ABNORMAL HIGH (ref 1.005–1.030)
pH: 5.5 (ref 5.0–8.0)

## 2017-12-22 MED ORDER — CEPHALEXIN 500 MG PO CAPS: 500.0000 mg | ORAL_CAPSULE | Freq: Two times a day (BID) | ORAL | 0 refills | Status: AC

## 2017-12-22 NOTE — ED Provider Notes (Signed)
[facility] ____________________________________________  Time seen: Approximately 5:00 PM  I have reviewed the triage vital signs and the nursing notes.   HISTORY  Chief Complaint Hematuria   HPI Amber [name] is a 84 y.o. female with past medical history of CAD, chronic low back pain, chronic left hip pain, congestive heart failure, CABG, diabetes presenting with daughter-in-law and caregiver at bedside for evaluation of hematuria that was noted today.  Patient denies previously seeing blood in her urine in the last several weeks other than today.  [name] states that she was helping patient in the restroom and some blood in urine prompting evaluation.  Patient denies any vaginal bleeding.  States blood is only present with urination.  States has been wearing a pain liner and not seeing any blood, discharge or drainage present on the pantiliner.  Reports chronic low back and left hip pain that has been unchanged in the last several days.  Denies associated fevers, abdominal pain, rash, trauma.  Continues to eat and drink well.  Denies burning with urination, does report some urinary frequency.  Patient reports that she was seen in urgent care approximately 1 month ago for the same complaint and was treated with an antibiotic at that time symptoms then resolved.  Denies chest pain, shortness of breath, fevers, extremity swelling, rash or other complaints.  Idelle Crouch, MD: PCP  Past Medical History:  Diagnosis Date  . Anemia   . Anxiety   . Arthritis   . Benign neoplasm of colon   . CHF (congestive heart failure) ([place])   . Chronic kidney disease   . Chronic pain   . Complication of anesthesia 1980's   hard time waking up  . Coronary artery disease with unspecified angina pectoris   . Cough   . Diabetes mellitus without complication ([place])   . Essential hypertension   . High risk medication use   . Hyperlipidemia   . Plantar fascial fibromatosis     . Presence of permanent cardiac pacemaker   . Shortness of breath dyspnea     Patient Active Problem List   Diagnosis Date Noted  . Chronic pain syndrome 04/09/2017  . Other specified health status 04/08/2017  . Disorder of bone, unspecified 04/08/2017  . Long term current use of opiate analgesic 04/08/2017  . Chronic low back pain (Primary Area of Pain) (Bilateral) (L>R) 04/08/2017  . Chronic pain of both lower extremities  (Tertiary Area of Pain) 04/08/2017  . Chronic groin pain (Secondary Area of Pain) 04/08/2017  . Chronic neck pain (Fourth Area of Pain) (Bilateral) (L>R) 04/08/2017  . Chronic sacroiliac joint pain 04/08/2017  . Other long term (current) drug therapy 04/08/2017  . Syncope 02/09/2017  . Asthmatic bronchitis 01/26/2017  . Fall 10/06/2016  . Anxiety 08/08/2016  . TIA (transient ischemic attack) 07/25/2015  . Sick sinus syndrome ([place]) 03/14/2015  . Chronic heart failure ([place]) 01/05/2015  . Fracture dislocation of ankle 07/27/2014  . IDDM (insulin dependent diabetes mellitus) ([place]) 07/27/2014  . Essential hypertension 07/27/2014  . Multiple rib fractures 07/27/2014  . 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 ABDOMINAL HYSTERECTOMY    . BACK SURGERY  1960's   cage and screws in lower back  . CATARACT EXTRACTION W/ INTRAOCULAR LENS  IMPLANT, BILATERAL Bilateral   . CORONARY ARTERY BYPASS GRAFT  2008  . JOINT REPLACEMENT Right    hip and knee  . ORIF ANKLE FRACTURE  Right 07/27/2014   Procedure: OPEN REDUCTION INTERNAL FIXATION (ORIF) ANKLE FRACTURE;  Surgeon: Alta Corning, MD;  Location: [facility];  Service: Orthopedics;  Laterality: Right;  . PACEMAKER INSERTION Left 03/14/2015   Procedure: INSERTION PACEMAKER;  Surgeon: Isaias Cowman, MD;  Location: [facility];  Service: Cardiovascular;  Laterality: Left;  . ROTATOR CUFF REPAIR    . TOTAL HIP ARTHROPLASTY Right   . TOTAL KNEE ARTHROPLASTY Right   . VEIN  LIGATION AND STRIPPING       No current facility-administered medications for this encounter.   Current Outpatient Medications:  .  albuterol (PROVENTIL HFA;VENTOLIN HFA) 108 (90 Base) MCG/ACT inhaler, Inhale 2 puffs into the lungs every 6 (six) hours as needed for wheezing or shortness of breath., Disp: 1 Inhaler, Rfl: 0 .  alendronate (FOSAMAX) 70 MG tablet, Take 70 mg by mouth every Thursday. , Disp: , Rfl:  .  ALPRAZolam (XANAX) 1 MG tablet, Take 1 tablet (1 mg total) by mouth at bedtime as needed for anxiety. (Patient taking differently: Take 0.5 mg by mouth at bedtime as needed for anxiety. ), Disp: 30 tablet, Rfl: 0 .  amLODipine (NORVASC) 10 MG tablet, Take 5 mg by mouth daily. , Disp: , Rfl:  .  apixaban (ELIQUIS) 5 MG TABS tablet, Take 5 mg by mouth 2 (two) times daily., Disp: , Rfl:  .  calcium carbonate (OSCAL) 1500 (600 Ca) MG TABS tablet, Take 600 mg of elemental calcium by mouth daily with breakfast., Disp: , Rfl:  .  carvedilol (COREG) 6.25 MG tablet, Take 1 tablet (6.25 mg total) by mouth 2 (two) times daily with a meal., Disp: 60 tablet, Rfl: 0 .  cephALEXin (KEFLEX) 500 MG capsule, Take 1 capsule (500 mg total) by mouth 2 (two) times daily for 7 days., Disp: 14 capsule, Rfl: 0 .  furosemide (LASIX) 20 MG tablet, TAKE ONE TABLET BY MOUTH ONCE DAILY, Disp: 90 tablet, Rfl: 0 .  gabapentin (NEURONTIN) 100 MG capsule, Take 200 mg by mouth 3 (three) times daily. , Disp: , Rfl:  .  insulin aspart protamine- aspart (NOVOLOG MIX 70/30) (70-30) 100 UNIT/ML injection, Inject 0.15 mLs (15 Units total) into the skin 2 (two) times daily with a meal., Disp: 10 mL, Rfl: 11 .  Insulin NPH Isophane & Regular (NOVOLIN 70/30 [place]), Inject 10 Units into the skin 2 (two) times daily. prn, Disp: , Rfl:  .  ipratropium-albuterol (DUONEB) 0.5-2.5 (3) MG/3ML SOLN, Inhale 3 mLs into the lungs 4 (four) times daily., Disp: , Rfl:  .  levETIRAcetam (KEPPRA) 500 MG tablet, Take 1 tablet by mouth 2 (two) times  daily., Disp: , Rfl:  .  Menthol-Camphor (ICY HOT ARTHRITIS PAIN RELIEF) 16-4 % LOTN, Apply 1 application topically 2 (two) times daily as needed., Disp: 1 Bottle, Rfl: 0 .  Multiple Vitamins-Minerals (PRESERVISION AREDS 2) CAPS, Take 1 capsule by mouth daily., Disp: , Rfl:  .  omeprazole (PRILOSEC) 20 MG capsule, Take 20 mg by mouth daily., Disp: , Rfl:  .  pravastatin (PRAVACHOL) 40 MG tablet, Take 40 mg by mouth at bedtime., Disp: , Rfl:  .  sacubitril-valsartan (ENTRESTO) 24-26 MG, Take 1 tablet by mouth 2 (two) times daily., Disp: , Rfl:  .  traMADol (ULTRAM) 50 MG tablet, Take 1 tablet (50 mg total) by mouth every 6 (six) hours as needed., Disp: 15 tablet, Rfl: 0  Allergies Ambien [zolpidem tartrate]; Etodolac; Iodine; Nsaids; Penicillins; and Succinylcholine  Family History  Problem Relation Age of Onset  .  Heart attack Mother   . Heart disease Father   . Alzheimer's disease Sister   . Cervical cancer Sister   . Heart failure Son     Social History Social History   Tobacco Use  . Smoking status: Never Smoker  . Smokeless tobacco: Never Used  Substance Use Topics  . Alcohol use: No  . Drug use: No    Review of Systems Constitutional: No fever/chills Cardiovascular: Denies chest pain. Respiratory: Denies shortness of breath. Gastrointestinal: No abdominal pain.  No nausea, no vomiting.  No diarrhea.  No constipation.  Reports last bowel movement was earlier today prior to arrival with normal coloration.  Denies any melena or abnormal colored stool. Genitourinary: As above.  Musculoskeletal: Negative for change in chronic back or left hip pain. Skin: Negative for rash.   ____________________________________________   PHYSICAL EXAM:  VITAL SIGNS: ED Triage Vitals  Enc Vitals Group     BP 12/22/17 1607 (!) 145/76     Pulse Rate 12/22/17 1607 62     Resp 12/22/17 1607 16     Temp 12/22/17 1607 98.2 F (36.8 C)     Temp Source 12/22/17 1607 Oral     SpO2  12/22/17 1607 96 %     Weight 12/22/17 1605 165 lb (74.8 kg)     Height 12/22/17 1605 4\' 11"  (1.499 m)     Head Circumference --      Peak Flow --      Pain Score 12/22/17 1604 0     Pain Loc --      Pain Edu? --      Excl. in [place]? --     Constitutional: Alert and oriented. Well appearing and in no acute distress. Eyes: Conjunctivae are normal.  ENT      Head: Normocephalic and atraumatic.      Mouth/Throat: Mucous membranes are moist. Cardiovascular: Normal rate, regular rhythm. Grossly normal heart sounds.  Good peripheral circulation. Respiratory: Normal respiratory effort without tachypnea nor retractions. Breath sounds are clear and equal bilaterally. No wheezes, rales, rhonchi. Gastrointestinal: Soft and nontender.  Obese abdomen.  No CVA tenderness. Musculoskeletal: Bilateral lower extremities nontender no edema noted. Neurologic:  Normal speech and language. Speech is normal. No gait instability.  Skin:  Skin is warm, dry and intact. No rash noted. Psychiatric: Mood and affect are normal. Speech and behavior are normal. Patient exhibits appropriate insight and judgment   ___________________________________________   LABS (all labs ordered are listed, but only abnormal results are displayed)  Labs Reviewed  URINALYSIS, COMPLETE (UACMP) WITH MICROSCOPIC - Abnormal; Notable for the following components:      Result Value   Color, Urine BROWN (*)    APPearance CLOUDY (*)    Specific Gravity, Urine >1.030 (*)    Hgb urine dipstick LARGE (*)    Bilirubin Urine LARGE (*)    Ketones, ur 15 (*)    Protein, ur 100 (*)    All other components within normal limits  CBC WITH DIFFERENTIAL/PLATELET - Abnormal; Notable for the following components:   WBC 16.2 (*)    RDW 16.6 (*)    Neutro Abs 13.1 (*)    Monocytes Absolute 1.7 (*)    All other components within normal limits  COMPREHENSIVE METABOLIC PANEL - Abnormal; Notable for the following components:   Chloride 99 (*)     Glucose, Bld 139 (*)    BUN 24 (*)    ALT 12 (*)    GFR calc non  Af Amer 57 (*)    All other components within normal limits  URINE CULTURE   ____________________________________________   PROCEDURES Procedures    INITIAL IMPRESSION / ASSESSMENT AND PLAN / ED COURSE  Pertinent labs & imaging results that were available during my care of the patient were reviewed by me and considered in my medical decision making (see chart for details).  Overall well-appearing patient.  No acute distress.  Family at bedside.  Patient presenting for episode of gross hematuria, soft at times in the last 2 months.  Abdomen soft and nontender.  Overall kidney function consistent with previous.  Increased WBC with left shift, concern for acute cystitis.  No bacteria noted on exam, no other changes of patient medical history per patient, and will empirically start patient on oral Keflex.  Reports she tolerates Keflex well.  Recommend for patient to have prompt follow-up with primary care as well as to be seen by urology for likely cystoscopy and further evaluation of gross hematuria.  Patient again denies any vaginal bleeding, or other bleeding.  Encourage supportive care and close follow-up.  Discussed strict follow-up and return parameters including returning to urgent care proceeding to ER for any fevers, pain, urinary retention or worsening concerns.Discussed indication, risks and benefits of medications with patient.  Discussed follow up with Primary care physician in 2 days. Discussed follow up and return parameters including no resolution or any worsening concerns. Patient verbalized understanding and agreed to plan.   ____________________________________________   FINAL CLINICAL IMPRESSION(S) / ED DIAGNOSES  Final diagnoses:  Gross hematuria     ED Discharge Orders        Ordered    cephALEXin (KEFLEX) 500 MG capsule  2 times daily     12/22/17 1739       Note: This dictation was prepared  with Dragon dictation along with smaller phrase technology. Any transcriptional errors that result from this process are unintentional.         Marylene Land, NP 12/22/17 [phone]

## 2017-12-22 NOTE — Discharge Instructions (Addendum)
Take medication as prescribed. Rest. Drink plenty of fluids.   You need to have very close follow-up regarding this.  As discussed you need to have close follow-up and further evaluation for hematuria with urologist.  If unable to be seen by urologist in 2 to 3 days please be seen by your  primary care physician.  Return to Urgent care for new or worsening concerns.

## 2017-12-22 NOTE — ED Triage Notes (Signed)
Patient states she has been "passing blood all night and into today"

## 2017-12-24 LAB — URINE CULTURE: CULTURE: NO GROWTH

## 2018-01-03 NOTE — Progress Notes (Signed)
01/08/2018 10:33 AM   Amber [name] [birth year] [id number]  Referring provider: Idelle Crouch, MD [facility] [place], [place] [address]  Chief Complaint  Patient presents with  . Hematuria    New Patient    HPI: Patient is a 84 -year-old Caucasian female who presents today as a referral from [facility] for gross hematuria with daughter in law, Amber [name].    She urinated blood on two occassions.  On the first occasion, she was given an antibiotic and it cleared right up.  The blood in the urine returned and she was referred to [place].     She does not have a prior history of recurrent urinary tract infections, nephrolithiasis, trauma to the genitourinary tract or malignancies of the genitourinary tract.   She does not have a family medical history of nephrolithiasis, malignancies of the genitourinary tract or hematuria.   Today, she is having symptoms of frequent urination, urgency, nocturia and incontinence.  She wears a pad.  Her UA today demonstrates 0-5 RBC's.  Patient denies any dysuria or suprapubic/flank pain.  Patient denies any fevers, chills, nausea or vomiting.    She had a bladder tacking years ago.    She is not a smoker. She is not exposed to secondhand smoke.  She has not worked with Sports administrator, trichloroethylene, etc.   She has a high BMI.     PMH: Past Medical History:  Diagnosis Date  . Anemia   . Anxiety   . Arthritis   . Benign neoplasm of colon   . CHF (congestive heart failure) ([place])   . Chronic kidney disease   . Chronic pain   . Complication of anesthesia 1980's   hard time waking up  . Coronary artery disease with unspecified angina pectoris   . Cough   . Diabetes mellitus without complication ([place])   . Essential hypertension   . High risk medication use   . Hyperlipidemia   . Plantar fascial fibromatosis   . Presence of permanent cardiac pacemaker   . Shortness of breath dyspnea     Surgical  History: Past Surgical History:  Procedure Laterality Date  . ABDOMINAL HYSTERECTOMY    . BACK SURGERY  1960's   cage and screws in lower back  . CATARACT EXTRACTION W/ INTRAOCULAR LENS  IMPLANT, BILATERAL Bilateral   . CORONARY ARTERY BYPASS GRAFT  2008  . JOINT REPLACEMENT Right    hip and knee  . ORIF ANKLE FRACTURE Right 07/27/2014   Procedure: OPEN REDUCTION INTERNAL FIXATION (ORIF) ANKLE FRACTURE;  Surgeon: Alta Corning, MD;  Location: [facility];  Service: Orthopedics;  Laterality: Right;  . PACEMAKER INSERTION Left 03/14/2015   Procedure: INSERTION PACEMAKER;  Surgeon: Isaias Cowman, MD;  Location: [facility];  Service: Cardiovascular;  Laterality: Left;  . ROTATOR CUFF REPAIR    . TOTAL HIP ARTHROPLASTY Right   . TOTAL KNEE ARTHROPLASTY Right   . VEIN LIGATION AND STRIPPING      Home Medications:  Allergies as of 01/08/2018      Reactions   Ambien [zolpidem Tartrate] Other (See Comments)   Reaction:  Keeps pt awake    Etodolac Other (See Comments)   Reaction:  Unknown    Iodine Itching   Nsaids Other (See Comments)   Reaction:  Unknown    Penicillins Itching, Other (See Comments)   Has patient had a PCN reaction causing immediate rash, facial/tongue/throat swelling, SOB or lightheadedness with hypotension: No  Has patient had a PCN reaction causing severe rash involving mucus membranes or skin necrosis: No Has patient had a PCN reaction that required hospitalization No Has patient had a PCN reaction occurring within the last 10 years: No If all of the above answers are "NO", then may proceed with Cephalosporin use.   Succinylcholine Other (See Comments)   Reaction:  Unknown       Medication List        Accurate as of 01/08/18 10:33 AM. Always use your most recent med list.          albuterol 108 (90 Base) MCG/ACT inhaler Commonly known as:  PROVENTIL HFA;VENTOLIN HFA Inhale 2 puffs into the lungs every 6 (six) hours as needed for wheezing or shortness of breath.     alendronate 70 MG tablet Commonly known as:  FOSAMAX Take 70 mg by mouth every Thursday.   ALPRAZolam 1 MG tablet Commonly known as:  XANAX Take 1 tablet (1 mg total) by mouth at bedtime as needed for anxiety.   amLODipine 10 MG tablet Commonly known as:  NORVASC Take 5 mg by mouth daily.   calcium carbonate 1500 (600 Ca) MG Tabs tablet Commonly known as:  OSCAL Take 600 mg of elemental calcium by mouth daily with breakfast.   carvedilol 6.25 MG tablet Commonly known as:  COREG Take 1 tablet (6.25 mg total) by mouth 2 (two) times daily with a meal.   ELIQUIS 5 MG Tabs tablet Generic drug:  apixaban Take 5 mg by mouth 2 (two) times daily.   furosemide 20 MG tablet Commonly known as:  LASIX TAKE ONE TABLET BY MOUTH ONCE DAILY   gabapentin 100 MG capsule Commonly known as:  NEURONTIN Take 200 mg by mouth 3 (three) times daily.   insulin aspart protamine- aspart (70-30) 100 UNIT/ML injection Commonly known as:  NOVOLOG MIX 70/30 Inject 0.15 mLs (15 Units total) into the skin 2 (two) times daily with a meal.   ipratropium-albuterol 0.5-2.5 (3) MG/3ML Soln Commonly known as:  DUONEB Inhale 3 mLs into the lungs 4 (four) times daily.   levETIRAcetam 500 MG tablet Commonly known as:  KEPPRA Take 1 tablet by mouth 2 (two) times daily.   Menthol-Camphor 16-4 % Lotn Commonly known as:  ICY HOT ARTHRITIS PAIN RELIEF Apply 1 application topically 2 (two) times daily as needed.   NOVOLIN 70/30 [place] Inject 10 Units into the skin 2 (two) times daily. prn   omeprazole 20 MG capsule Commonly known as:  PRILOSEC Take 20 mg by mouth daily.   pravastatin 40 MG tablet Commonly known as:  PRAVACHOL Take 40 mg by mouth at bedtime.   PRESERVISION AREDS 2 Caps Take 1 capsule by mouth daily.   sacubitril-valsartan 24-26 MG Commonly known as:  ENTRESTO Take 1 tablet by mouth 2 (two) times daily.   traMADol 50 MG tablet Commonly known as:  ULTRAM Take 1 tablet (50 mg total) by  mouth every 6 (six) hours as needed.       Allergies:  Allergies  Allergen Reactions  . Ambien [Zolpidem Tartrate] Other (See Comments)    Reaction:  Keeps pt awake   . Etodolac Other (See Comments)    Reaction:  Unknown   . Iodine Itching  . Nsaids Other (See Comments)    Reaction:  Unknown   . Penicillins Itching and Other (See Comments)    Has patient had a PCN reaction causing immediate rash, facial/tongue/throat swelling, SOB or lightheadedness with hypotension: No Has  patient had a PCN reaction causing severe rash involving mucus membranes or skin necrosis: No Has patient had a PCN reaction that required hospitalization No Has patient had a PCN reaction occurring within the last 10 years: No If all of the above answers are "NO", then may proceed with Cephalosporin use.  . Succinylcholine Other (See Comments)    Reaction:  Unknown     Family History: Family History  Problem Relation Age of Onset  . Heart attack Mother   . Heart disease Father   . Alzheimer's disease Sister   . Cervical cancer Sister   . Heart failure Son     Social History:  reports that she has never smoked. She has never used smokeless tobacco. She reports that she does not drink alcohol or use drugs.  ROS: UROLOGY Frequent Urination?: Yes Hard to postpone urination?: Yes Burning/pain with urination?: No Get up at night to urinate?: Yes Leakage of urine?: Yes Urine stream starts and stops?: No Trouble starting stream?: No Do you have to strain to urinate?: No Blood in urine?: Yes Urinary tract infection?: No Sexually transmitted disease?: No Injury to kidneys or bladder?: No Painful intercourse?: No Weak stream?: No Currently pregnant?: No Vaginal bleeding?: No Last menstrual period?: n  Gastrointestinal Nausea?: No Vomiting?: No Indigestion/heartburn?: No Diarrhea?: No Constipation?: No  Constitutional Fever: No Night sweats?: No Weight loss?: No Fatigue?: No  Skin Skin  rash/lesions?: No Itching?: No  Eyes Blurred vision?: No Double vision?: No  Ears/Nose/Throat Sore throat?: No Sinus problems?: No  Hematologic/Lymphatic Swollen glands?: No Easy bruising?: No  Cardiovascular Leg swelling?: No Chest pain?: No  Respiratory Cough?: No Shortness of breath?: No  Endocrine Excessive thirst?: No  Musculoskeletal Back pain?: No Joint pain?: No  Neurological Headaches?: No Dizziness?: No  Psychologic Depression?: No Anxiety?: No  Physical Exam: BP 117/62   Pulse (!) 59   Ht 4\' 11"  (1.499 m)   Wt 165 lb (74.8 kg)   BMI 33.33 kg/m   Constitutional:  Well nourished. Alert and oriented, No acute distress. HEENT: [place] AT, moist mucus membranes.  Trachea midline, no masses. Cardiovascular: No clubbing, cyanosis, or edema. Respiratory: Normal respiratory effort, no increased work of breathing. GI: Abdomen is soft, non tender, non distended, no abdominal masses. Liver and spleen not palpable.  No hernias appreciated.  Stool sample for occult testing is not indicated.   GU: No CVA tenderness.  No bladder fullness or masses.   Skin: No rashes, bruises or suspicious lesions. Lymph: No cervical or inguinal adenopathy. Neurologic: Grossly intact, no focal deficits, moving all 4 extremities. Psychiatric: Normal mood and affect.  Laboratory Data: Lab Results  Component Value Date   WBC 16.2 (H) 12/22/2017   HGB 13.9 12/22/2017   HCT 41.9 12/22/2017   MCV 86.4 12/22/2017   PLT 272 12/22/2017    Lab Results  Component Value Date   CREATININE 0.83 01/08/2018    No results found for: PSA  No results found for: TESTOSTERONE  Lab Results  Component Value Date   HGBA1C 8.8 (H) 01/27/2017    No results found for: TSH     Component Value Date/Time   CHOL 177 07/26/2015 0133   HDL 61 07/26/2015 0133   CHOLHDL 2.9 07/26/2015 0133   VLDL 24 07/26/2015 0133   LDLCALC 92 07/26/2015 0133    Lab Results  Component Value Date   AST  19 12/22/2017   Lab Results  Component Value Date   ALT 12 (L) 12/22/2017  No components found for: ALKALINEPHOPHATASE No components found for: BILIRUBINTOTAL  No results found for: ESTRADIOL   Urinalysis See HPI and Epic.   I have reviewed the labs.  Pertinent Imaging: CT scan in 07/2014 noted two 37mm left renal stones  Assessment & Plan:    1. Gross hematuria Explained to the patient that there are a number of causes that can be associated with blood in the urine, such as stones, UTI's, damage to the urinary tract and/or cancer. At this time, I felt that the patient warranted further urologic evaluation.   The AUA guidelines state that a CT urogram is the preferred imaging study to evaluate hematuria - due to her age, co morbidities and possible allergic reaction to iodine we will have her undergo a non contrast CT and pursue RTG's and/or RUS pending cystoscopy results after discussion with the patient and daughter-in-law and they understand some urological tumors may be missed Following the imaging study,  I've recommended a cystoscopy. I described how this is performed, typically in an office setting with a flexible cystoscope. We described the risks, benefits, and possible side effects, the most common of which is a minor amount of blood in the urine and/or burning which usually resolves in 24 to 48 hours.  The patient had the opportunity to ask questions which were answered. Based upon this discussion, the patient is willing to proceed. Therefore, I've ordered: a CT and cystoscopy. The patient will return following all of the above for discussion of the results.  UA 0-5 RBC's Urine culture pending BUN + creatinine  pending  2. History of nephrolithiasis Two 3 mm stones seen on 2016 CT in the left kidney CT pending  Return for CT report and cystoscopy.  These notes generated with voice recognition software. I apologize for typographical errors.  Zara Council,  PA-C  [facility] [address] [place]  [place], [place] [address] [phone]

## 2018-01-06 DIAGNOSIS — E119 Type 2 diabetes mellitus without complications: Secondary | ICD-10-CM | POA: Diagnosis not present

## 2018-01-06 DIAGNOSIS — Z96641 Presence of right artificial hip joint: Secondary | ICD-10-CM | POA: Diagnosis not present

## 2018-01-06 DIAGNOSIS — E669 Obesity, unspecified: Secondary | ICD-10-CM | POA: Diagnosis not present

## 2018-01-06 DIAGNOSIS — M1612 Unilateral primary osteoarthritis, left hip: Secondary | ICD-10-CM | POA: Insufficient documentation

## 2018-01-06 DIAGNOSIS — G8929 Other chronic pain: Secondary | ICD-10-CM | POA: Diagnosis not present

## 2018-01-06 DIAGNOSIS — M25552 Pain in left hip: Secondary | ICD-10-CM | POA: Diagnosis not present

## 2018-01-06 DIAGNOSIS — Z794 Long term (current) use of insulin: Secondary | ICD-10-CM | POA: Diagnosis not present

## 2018-01-08 ENCOUNTER — Other Ambulatory Visit
Admission: RE | Admit: 2018-01-08 | Discharge: 2018-01-08 | Disposition: A | Discharge status: Home / Self Care | Payer: Medicare HMO | Source: Ambulatory Visit | Attending: Urology | Admitting: Urology

## 2018-01-08 ENCOUNTER — Encounter: Payer: Self-pay | Admitting: Urology

## 2018-01-08 ENCOUNTER — Ambulatory Visit (INDEPENDENT_AMBULATORY_CARE_PROVIDER_SITE_OTHER): Payer: Medicare HMO | Admitting: Urology

## 2018-01-08 ENCOUNTER — Other Ambulatory Visit: Payer: Self-pay

## 2018-01-08 VITALS — BP 117/62 | HR 59 | Ht 59.0 in | Wt 165.0 lb

## 2018-01-08 DIAGNOSIS — Z87442 Personal history of urinary calculi: Secondary | ICD-10-CM

## 2018-01-08 DIAGNOSIS — R310 Gross hematuria: Secondary | ICD-10-CM

## 2018-01-08 LAB — BUN: BUN: 23 mg/dL (ref 8–23)

## 2018-01-08 LAB — URINALYSIS, COMPLETE (UACMP) WITH MICROSCOPIC
BACTERIA UA: NONE SEEN
Bilirubin Urine: NEGATIVE
GLUCOSE, UA: NEGATIVE mg/dL
Hgb urine dipstick: NEGATIVE
Ketones, ur: NEGATIVE mg/dL
Leukocytes, UA: NEGATIVE
Nitrite: NEGATIVE
PH: 5.5 (ref 5.0–8.0)
Protein, ur: NEGATIVE mg/dL
Specific Gravity, Urine: <1.005 — ABNORMAL LOW (ref 1.005–1.030)
WBC, UA: NONE SEEN WBC/hpf (ref 0–5)

## 2018-01-08 LAB — CREATININE, SERUM: CREATININE: 0.83 mg/dL (ref 0.44–1.00)

## 2018-01-09 LAB — URINE CULTURE: Culture: NO GROWTH

## 2018-01-13 DIAGNOSIS — M1612 Unilateral primary osteoarthritis, left hip: Secondary | ICD-10-CM | POA: Diagnosis not present

## 2018-01-25 DIAGNOSIS — I340 Nonrheumatic mitral (valve) insufficiency: Secondary | ICD-10-CM | POA: Diagnosis not present

## 2018-01-25 DIAGNOSIS — Z01810 Encounter for preprocedural cardiovascular examination: Secondary | ICD-10-CM | POA: Diagnosis not present

## 2018-01-25 DIAGNOSIS — I5022 Chronic systolic (congestive) heart failure: Secondary | ICD-10-CM | POA: Diagnosis not present

## 2018-01-25 DIAGNOSIS — I071 Rheumatic tricuspid insufficiency: Secondary | ICD-10-CM | POA: Diagnosis not present

## 2018-01-25 DIAGNOSIS — I10 Essential (primary) hypertension: Secondary | ICD-10-CM | POA: Diagnosis not present

## 2018-01-25 DIAGNOSIS — I5023 Acute on chronic systolic (congestive) heart failure: Secondary | ICD-10-CM | POA: Diagnosis not present

## 2018-01-25 DIAGNOSIS — I350 Nonrheumatic aortic (valve) stenosis: Secondary | ICD-10-CM | POA: Diagnosis not present

## 2018-01-25 DIAGNOSIS — I2510 Atherosclerotic heart disease of native coronary artery without angina pectoris: Secondary | ICD-10-CM | POA: Diagnosis not present

## 2018-01-25 DIAGNOSIS — I495 Sick sinus syndrome: Secondary | ICD-10-CM | POA: Diagnosis not present

## 2018-02-01 DIAGNOSIS — I350 Nonrheumatic aortic (valve) stenosis: Secondary | ICD-10-CM | POA: Diagnosis not present

## 2018-02-01 DIAGNOSIS — I25810 Atherosclerosis of coronary artery bypass graft(s) without angina pectoris: Secondary | ICD-10-CM | POA: Diagnosis not present

## 2018-02-01 DIAGNOSIS — I5022 Chronic systolic (congestive) heart failure: Secondary | ICD-10-CM | POA: Diagnosis not present

## 2018-02-01 DIAGNOSIS — I495 Sick sinus syndrome: Secondary | ICD-10-CM | POA: Diagnosis not present

## 2018-02-08 ENCOUNTER — Other Ambulatory Visit: Payer: Self-pay

## 2018-02-08 ENCOUNTER — Ambulatory Visit
Admission: RE | Admit: 2018-02-08 | Discharge: 2018-02-08 | Disposition: A | Discharge status: Home / Self Care | Payer: Medicare HMO | Source: Ambulatory Visit | Attending: Urology | Admitting: Urology

## 2018-02-08 ENCOUNTER — Encounter
Admission: RE | Admit: 2018-02-08 | Discharge: 2018-02-08 | Disposition: A | Discharge status: Home / Self Care | Payer: Medicare HMO | Source: Ambulatory Visit

## 2018-02-08 DIAGNOSIS — R310 Gross hematuria: Secondary | ICD-10-CM | POA: Insufficient documentation

## 2018-02-08 DIAGNOSIS — I130 Hypertensive heart and chronic kidney disease with heart failure and stage 1 through stage 4 chronic kidney disease, or unspecified chronic kidney disease: Secondary | ICD-10-CM | POA: Diagnosis present

## 2018-02-08 DIAGNOSIS — Z87442 Personal history of urinary calculi: Secondary | ICD-10-CM | POA: Insufficient documentation

## 2018-02-08 DIAGNOSIS — N200 Calculus of kidney: Secondary | ICD-10-CM

## 2018-02-08 DIAGNOSIS — Z7401 Bed confinement status: Secondary | ICD-10-CM | POA: Diagnosis not present

## 2018-02-08 DIAGNOSIS — Z96642 Presence of left artificial hip joint: Secondary | ICD-10-CM | POA: Diagnosis not present

## 2018-02-08 DIAGNOSIS — K5730 Diverticulosis of large intestine without perforation or abscess without bleeding: Secondary | ICD-10-CM

## 2018-02-08 DIAGNOSIS — I509 Heart failure, unspecified: Secondary | ICD-10-CM | POA: Diagnosis not present

## 2018-02-08 DIAGNOSIS — E785 Hyperlipidemia, unspecified: Secondary | ICD-10-CM | POA: Diagnosis present

## 2018-02-08 DIAGNOSIS — M6281 Muscle weakness (generalized): Secondary | ICD-10-CM | POA: Diagnosis not present

## 2018-02-08 DIAGNOSIS — E1122 Type 2 diabetes mellitus with diabetic chronic kidney disease: Secondary | ICD-10-CM | POA: Diagnosis present

## 2018-02-08 DIAGNOSIS — I700 Atherosclerosis of aorta: Secondary | ICD-10-CM | POA: Insufficient documentation

## 2018-02-08 DIAGNOSIS — Z886 Allergy status to analgesic agent status: Secondary | ICD-10-CM | POA: Diagnosis not present

## 2018-02-08 DIAGNOSIS — R262 Difficulty in walking, not elsewhere classified: Secondary | ICD-10-CM | POA: Diagnosis not present

## 2018-02-08 DIAGNOSIS — Z880 Allergy status to penicillin: Secondary | ICD-10-CM | POA: Diagnosis not present

## 2018-02-08 DIAGNOSIS — I25119 Atherosclerotic heart disease of native coronary artery with unspecified angina pectoris: Secondary | ICD-10-CM | POA: Diagnosis present

## 2018-02-08 DIAGNOSIS — Z794 Long term (current) use of insulin: Secondary | ICD-10-CM | POA: Diagnosis not present

## 2018-02-08 DIAGNOSIS — Z471 Aftercare following joint replacement surgery: Secondary | ICD-10-CM | POA: Diagnosis not present

## 2018-02-08 DIAGNOSIS — N189 Chronic kidney disease, unspecified: Secondary | ICD-10-CM | POA: Diagnosis present

## 2018-02-08 DIAGNOSIS — I5023 Acute on chronic systolic (congestive) heart failure: Secondary | ICD-10-CM | POA: Diagnosis present

## 2018-02-08 DIAGNOSIS — Z888 Allergy status to other drugs, medicaments and biological substances status: Secondary | ICD-10-CM | POA: Diagnosis not present

## 2018-02-08 DIAGNOSIS — M25559 Pain in unspecified hip: Secondary | ICD-10-CM | POA: Diagnosis not present

## 2018-02-08 DIAGNOSIS — Z950 Presence of cardiac pacemaker: Secondary | ICD-10-CM | POA: Diagnosis not present

## 2018-02-08 DIAGNOSIS — M1612 Unilateral primary osteoarthritis, left hip: Secondary | ICD-10-CM | POA: Diagnosis present

## 2018-02-08 DIAGNOSIS — M879 Osteonecrosis, unspecified: Secondary | ICD-10-CM | POA: Diagnosis present

## 2018-02-08 LAB — TYPE AND SCREEN
ABO/RH(D): A POS
ANTIBODY SCREEN: NEGATIVE

## 2018-02-08 LAB — URINALYSIS, ROUTINE W REFLEX MICROSCOPIC
BACTERIA UA: NONE SEEN
Bilirubin Urine: NEGATIVE
Glucose, UA: NEGATIVE mg/dL
Ketones, ur: NEGATIVE mg/dL
Leukocytes, UA: NEGATIVE
Nitrite: NEGATIVE
PROTEIN: NEGATIVE mg/dL
SPECIFIC GRAVITY, URINE: 1.016 (ref 1.005–1.030)
pH: 5 (ref 5.0–8.0)

## 2018-02-08 LAB — BASIC METABOLIC PANEL
Anion gap: 13 (ref 5–15)
BUN: 21 mg/dL (ref 8–23)
CALCIUM: 9.6 mg/dL (ref 8.9–10.3)
CO2: 27 mmol/L (ref 22–32)
CREATININE: 1.01 mg/dL — AB (ref 0.44–1.00)
Chloride: 95 mmol/L — ABNORMAL LOW (ref 98–111)
GFR calc Af Amer: 57 mL/min — ABNORMAL LOW (ref >60)
GFR, EST NON AFRICAN AMERICAN: 49 mL/min — AB (ref >60)
Glucose, Bld: 126 mg/dL — ABNORMAL HIGH (ref 70–99)
POTASSIUM: 4.1 mmol/L (ref 3.5–5.1)
SODIUM: 135 mmol/L (ref 135–145)

## 2018-02-08 LAB — CBC
HCT: 39.9 % (ref 35.0–47.0)
Hemoglobin: 13.9 g/dL (ref 12.0–16.0)
MCH: 30.6 pg (ref 26.0–34.0)
MCHC: 34.8 g/dL (ref 32.0–36.0)
MCV: 87.7 fL (ref 80.0–100.0)
PLATELETS: 226 10*3/uL (ref 150–440)
RBC: 4.55 MIL/uL (ref 3.80–5.20)
RDW: 16.3 % — AB (ref 11.5–14.5)
WBC: 7.1 10*3/uL (ref 3.6–11.0)

## 2018-02-08 LAB — PROTIME-INR
INR: 0.97
PROTHROMBIN TIME: 12.8 s (ref 11.4–15.2)

## 2018-02-08 LAB — SURGICAL PCR SCREEN
MRSA, PCR: NEGATIVE
STAPHYLOCOCCUS AUREUS: NEGATIVE

## 2018-02-08 LAB — APTT: aPTT: 31 seconds (ref 24–36)

## 2018-02-08 MED ORDER — CEFAZOLIN SODIUM-DEXTROSE 2-4 GM/100ML-% IV SOLN
2.0000 g | Freq: Once | INTRAVENOUS | Status: AC
  Administered 2018-02-09: 2 g via INTRAVENOUS

## 2018-02-08 NOTE — Patient Instructions (Signed)
Your procedure is scheduled on: Tues 8/6 Report to Day Surgery. To find out your arrival time please call [phone] between 1PM - 3PM on Today.  Remember: Instructions that are not followed completely may result in serious medical risk,  up to and including death, or upon the discretion of your surgeon and anesthesiologist your  surgery may need to be rescheduled.     _X__ 1. Do not eat food after midnight the night before your procedure.                 No gum chewing or hard candies. You may drink clear liquids up to 2 hours                 before you are scheduled to arrive for your surgery- DO not drink clear                 liquids within 2 hours of the start of your surgery.                 Clear Liquids include:  water, apple juice without pulp, clear carbohydrate                 drink such as Clearfast of Gatorade, Black Coffee or Tea (Do not add                 anything to coffee or tea).  __X__2.  On the morning of surgery brush your teeth with toothpaste and water, you                may rinse your mouth with mouthwash if you wish.  Do not swallow any toothpaste of mouthwash.     _X__ 3.  No Alcohol for 24 hours before or after surgery.   _X__ 4.  Do Not Smoke or use e-cigarettes For 24 Hours Prior to Your Surgery.                 Do not use any chewable tobacco products for at least 6 hours prior to                 surgery.  ____  5.  Bring all medications with you on the day of surgery if instructed.   __x__  6.  Notify your doctor if there is any change in your medical condition      (cold, fever, infections).     Do not wear jewelry, make-up, hairpins, clips or nail polish. Do not wear lotions, powders, or perfumes. You may wear deodorant. Do not shave 48 hours prior to surgery. Men may shave face and neck. Do not bring valuables to the hospital.    [facility] is not responsible for any belongings or valuables.  Contacts, dentures or  bridgework may not be worn into surgery. Leave your suitcase in the car. After surgery it may be brought to your room. For patients admitted to the hospital, discharge time is determined by your treatment team.   Patients discharged the day of surgery will not be allowed to drive home.   Please read over the following fact sheets that you were given:    _x___ Take these medicines the morning of surgery with A SIP OF WATER:    1. amLODipine (NORVASC) 10 MG tablet  2. carvedilol (COREG) 6.25 MG tablet  3. gabapentin (NEURONTIN) 100 MG capsule  4.levETIRAcetam (KEPPRA) 500 MG tablet  5.omeprazole (PRILOSEC) 20 MG capsule  Take extra dose the  night before and one the morning of surgery  6.traMADol (ULTRAM) 50 MG tablet  ____ Fleet Enema (as directed)   ____ Use CHG Soap as directed  _x___ Use inhalers on the day of surgery albuterol (PROVENTIL HFA;VENTOLIN HFA) 108 (90 Base) MCG/ACT inhaler and bring to hospital,  ____ Stop metformin 2 days prior to surgery    __x__ Take 1/2 of usual insulin dose the night before surgery. No insulin the morning          of surgery.  x ____ Stop apixaban (ELIQUIS) 5 MG TABS tablet stopped  Last dose on Friday 8/2  __x__ Stop Anti-inflammatories advil, aleve bc powders   ( May take tylenol and tramadol)   ____ Stop supplements until after surgery.    ____ Bring C-Pap to the hospital.

## 2018-02-09 ENCOUNTER — Inpatient Hospital Stay
Admission: RE | Admit: 2018-02-09 | Discharge: 2018-02-12 | DRG: 469 | Disposition: A | Discharge status: Skilled Nursing Facility | Payer: Medicare HMO | Source: Ambulatory Visit | Attending: Orthopedic Surgery | Admitting: Orthopedic Surgery

## 2018-02-09 ENCOUNTER — Encounter: Payer: Self-pay | Admitting: *Deleted

## 2018-02-09 ENCOUNTER — Encounter
Admission: RE | Disposition: A | Discharge status: Skilled Nursing Facility | Payer: Self-pay | Source: Ambulatory Visit | Attending: Orthopedic Surgery

## 2018-02-09 ENCOUNTER — Other Ambulatory Visit: Payer: Self-pay

## 2018-02-09 ENCOUNTER — Inpatient Hospital Stay: Payer: Medicare HMO

## 2018-02-09 ENCOUNTER — Inpatient Hospital Stay: Payer: Medicare HMO | Admitting: Anesthesiology

## 2018-02-09 DIAGNOSIS — Z471 Aftercare following joint replacement surgery: Secondary | ICD-10-CM | POA: Diagnosis not present

## 2018-02-09 DIAGNOSIS — N189 Chronic kidney disease, unspecified: Secondary | ICD-10-CM | POA: Diagnosis present

## 2018-02-09 DIAGNOSIS — I25119 Atherosclerotic heart disease of native coronary artery with unspecified angina pectoris: Secondary | ICD-10-CM | POA: Diagnosis present

## 2018-02-09 DIAGNOSIS — I130 Hypertensive heart and chronic kidney disease with heart failure and stage 1 through stage 4 chronic kidney disease, or unspecified chronic kidney disease: Secondary | ICD-10-CM | POA: Diagnosis present

## 2018-02-09 DIAGNOSIS — Z96642 Presence of left artificial hip joint: Secondary | ICD-10-CM | POA: Diagnosis not present

## 2018-02-09 DIAGNOSIS — M1612 Unilateral primary osteoarthritis, left hip: Principal | ICD-10-CM | POA: Diagnosis present

## 2018-02-09 DIAGNOSIS — Z950 Presence of cardiac pacemaker: Secondary | ICD-10-CM

## 2018-02-09 DIAGNOSIS — G8918 Other acute postprocedural pain: Secondary | ICD-10-CM

## 2018-02-09 DIAGNOSIS — Z886 Allergy status to analgesic agent status: Secondary | ICD-10-CM | POA: Diagnosis not present

## 2018-02-09 DIAGNOSIS — Z419 Encounter for procedure for purposes other than remedying health state, unspecified: Secondary | ICD-10-CM

## 2018-02-09 DIAGNOSIS — M879 Osteonecrosis, unspecified: Secondary | ICD-10-CM | POA: Diagnosis present

## 2018-02-09 DIAGNOSIS — E1122 Type 2 diabetes mellitus with diabetic chronic kidney disease: Secondary | ICD-10-CM | POA: Diagnosis present

## 2018-02-09 DIAGNOSIS — Z888 Allergy status to other drugs, medicaments and biological substances status: Secondary | ICD-10-CM

## 2018-02-09 DIAGNOSIS — I5023 Acute on chronic systolic (congestive) heart failure: Secondary | ICD-10-CM | POA: Diagnosis present

## 2018-02-09 DIAGNOSIS — Z880 Allergy status to penicillin: Secondary | ICD-10-CM | POA: Diagnosis not present

## 2018-02-09 DIAGNOSIS — E785 Hyperlipidemia, unspecified: Secondary | ICD-10-CM | POA: Diagnosis present

## 2018-02-09 DIAGNOSIS — Z794 Long term (current) use of insulin: Secondary | ICD-10-CM | POA: Diagnosis not present

## 2018-02-09 HISTORY — PX: TOTAL HIP ARTHROPLASTY: SHX124

## 2018-02-09 LAB — ABO/RH: ABO/RH(D): A POS

## 2018-02-09 LAB — GLUCOSE, CAPILLARY
Glucose-Capillary: 117 mg/dL — ABNORMAL HIGH (ref 70–99)
Glucose-Capillary: 121 mg/dL — ABNORMAL HIGH (ref 70–99)
Glucose-Capillary: 84 mg/dL (ref 70–99)

## 2018-02-09 SURGERY — ARTHROPLASTY, HIP, TOTAL, ANTERIOR APPROACH
Anesthesia: Spinal | Laterality: Left

## 2018-02-09 MED ORDER — FENTANYL CITRATE (PF) 100 MCG/2ML IJ SOLN
INTRAMUSCULAR | Status: DC | PRN
  Administered 2018-02-09 (×2): 25 ug via INTRAVENOUS
  Administered 2018-02-09: 50 ug via INTRAVENOUS

## 2018-02-09 MED ORDER — PROPOFOL 10 MG/ML IV BOLUS
INTRAVENOUS | Status: DC | PRN
  Administered 2018-02-09: 130 mg via INTRAVENOUS

## 2018-02-09 MED ORDER — PANTOPRAZOLE SODIUM 40 MG PO TBEC
80.0000 mg | DELAYED_RELEASE_TABLET | Freq: Every day | ORAL | Status: DC
  Administered 2018-02-10 – 2018-02-12 (×3): 80 mg via ORAL
  Filled 2018-02-09 (×3): qty 2

## 2018-02-09 MED ORDER — ROCURONIUM BROMIDE 100 MG/10ML IV SOLN
INTRAVENOUS | Status: DC | PRN
  Administered 2018-02-09: 30 mg via INTRAVENOUS

## 2018-02-09 MED ORDER — CEFAZOLIN SODIUM-DEXTROSE 1-4 GM/50ML-% IV SOLN
1.0000 g | Freq: Four times a day (QID) | INTRAVENOUS | Status: AC
  Administered 2018-02-09 – 2018-02-10 (×2): 1 g via INTRAVENOUS
  Filled 2018-02-09 (×2): qty 50

## 2018-02-09 MED ORDER — APIXABAN 2.5 MG PO TABS
2.5000 mg | ORAL_TABLET | Freq: Two times a day (BID) | ORAL | Status: AC
  Administered 2018-02-10 (×2): 2.5 mg via ORAL
  Filled 2018-02-09 (×2): qty 1

## 2018-02-09 MED ORDER — DOCUSATE SODIUM 100 MG PO CAPS
100.0000 mg | ORAL_CAPSULE | Freq: Two times a day (BID) | ORAL | Status: DC
  Administered 2018-02-09 – 2018-02-12 (×6): 100 mg via ORAL
  Filled 2018-02-09 (×6): qty 1

## 2018-02-09 MED ORDER — NEOMYCIN-POLYMYXIN B GU 40-200000 IR SOLN
Status: DC | PRN
  Administered 2018-02-09: 4 mL

## 2018-02-09 MED ORDER — LEVETIRACETAM 500 MG PO TABS
500.0000 mg | ORAL_TABLET | Freq: Two times a day (BID) | ORAL | Status: DC
  Administered 2018-02-09 – 2018-02-12 (×6): 500 mg via ORAL
  Filled 2018-02-09 (×7): qty 1

## 2018-02-09 MED ORDER — ALUM & MAG HYDROXIDE-SIMETH 200-200-20 MG/5ML PO SUSP: 30.0000 mL | ORAL | Status: DC | PRN

## 2018-02-09 MED ORDER — AMLODIPINE BESYLATE 5 MG PO TABS
5.0000 mg | ORAL_TABLET | Freq: Every day | ORAL | Status: DC
Start: 2018-02-10 — End: 2018-02-12
  Administered 2018-02-10: 5 mg via ORAL
  Filled 2018-02-09 (×2): qty 1

## 2018-02-09 MED ORDER — SUGAMMADEX SODIUM 200 MG/2ML IV SOLN
INTRAVENOUS | Status: DC | PRN
  Administered 2018-02-09: 146 mg via INTRAVENOUS

## 2018-02-09 MED ORDER — SODIUM CHLORIDE 0.9 % IV SOLN
INTRAVENOUS | Status: DC
  Administered 2018-02-09: 12:00:00 via INTRAVENOUS

## 2018-02-09 MED ORDER — PHENOL 1.4 % MT LIQD
1.0000 | OROMUCOSAL | Status: DC | PRN
  Filled 2018-02-09: qty 177

## 2018-02-09 MED ORDER — METHOCARBAMOL 1000 MG/10ML IJ SOLN
500.0000 mg | Freq: Four times a day (QID) | INTRAVENOUS | Status: DC | PRN
  Filled 2018-02-09: qty 5

## 2018-02-09 MED ORDER — INSULIN ASPART PROT & ASPART (70-30 MIX) 100 UNIT/ML ~~LOC~~ SUSP: 15.0000 [IU] | Freq: Two times a day (BID) | SUBCUTANEOUS | Status: DC

## 2018-02-09 MED ORDER — ONDANSETRON HCL 4 MG/2ML IJ SOLN: 4.0000 mg | Freq: Four times a day (QID) | INTRAMUSCULAR | Status: DC | PRN

## 2018-02-09 MED ORDER — ALBUTEROL SULFATE (2.5 MG/3ML) 0.083% IN NEBU: 2.5000 mg | INHALATION_SOLUTION | Freq: Four times a day (QID) | RESPIRATORY_TRACT | Status: DC | PRN

## 2018-02-09 MED ORDER — HYDROCODONE-ACETAMINOPHEN 5-325 MG PO TABS
1.0000 | ORAL_TABLET | ORAL | Status: DC | PRN
Start: 2018-02-09 — End: 2018-02-12
  Administered 2018-02-09: 2 via ORAL
  Administered 2018-02-10 (×3): 1 via ORAL
  Filled 2018-02-09 (×3): qty 1
  Filled 2018-02-09: qty 2

## 2018-02-09 MED ORDER — FENTANYL CITRATE (PF) 100 MCG/2ML IJ SOLN
25.0000 ug | INTRAMUSCULAR | Status: DC | PRN
  Administered 2018-02-09 (×4): 25 ug via INTRAVENOUS

## 2018-02-09 MED ORDER — SACUBITRIL-VALSARTAN 24-26 MG PO TABS
1.0000 | ORAL_TABLET | Freq: Two times a day (BID) | ORAL | Status: DC
  Administered 2018-02-09 – 2018-02-12 (×6): 1 via ORAL
  Filled 2018-02-09 (×7): qty 1

## 2018-02-09 MED ORDER — METOCLOPRAMIDE HCL 10 MG PO TABS: 5.0000 mg | ORAL_TABLET | Freq: Three times a day (TID) | ORAL | Status: DC | PRN

## 2018-02-09 MED ORDER — ACETAMINOPHEN 500 MG PO TABS
500.0000 mg | ORAL_TABLET | Freq: Four times a day (QID) | ORAL | Status: AC
  Administered 2018-02-09 – 2018-02-10 (×3): 500 mg via ORAL
  Filled 2018-02-09 (×3): qty 1

## 2018-02-09 MED ORDER — FUROSEMIDE 20 MG PO TABS
20.0000 mg | ORAL_TABLET | Freq: Every day | ORAL | Status: DC
Start: 2018-02-09 — End: 2018-02-12
  Administered 2018-02-10 – 2018-02-12 (×3): 20 mg via ORAL
  Filled 2018-02-09 (×3): qty 1

## 2018-02-09 MED ORDER — ONDANSETRON HCL 4 MG/2ML IJ SOLN: 4.0000 mg | Freq: Once | INTRAMUSCULAR | Status: DC | PRN

## 2018-02-09 MED ORDER — ACETAMINOPHEN 325 MG PO TABS
325.0000 mg | ORAL_TABLET | Freq: Four times a day (QID) | ORAL | Status: DC | PRN
Start: 2018-02-10 — End: 2018-02-12

## 2018-02-09 MED ORDER — DEXAMETHASONE SODIUM PHOSPHATE 10 MG/ML IJ SOLN
INTRAMUSCULAR | Status: DC | PRN
  Administered 2018-02-09: 4 mg via INTRAVENOUS

## 2018-02-09 MED ORDER — METOCLOPRAMIDE HCL 5 MG/ML IJ SOLN: 5.0000 mg | Freq: Three times a day (TID) | INTRAMUSCULAR | Status: DC | PRN

## 2018-02-09 MED ORDER — SODIUM CHLORIDE 0.9 % IV SOLN
INTRAVENOUS | Status: DC | PRN
  Administered 2018-02-09: 100 ug/min via INTRAVENOUS

## 2018-02-09 MED ORDER — ONDANSETRON HCL 4 MG PO TABS: 4.0000 mg | ORAL_TABLET | Freq: Four times a day (QID) | ORAL | Status: DC | PRN

## 2018-02-09 MED ORDER — ONDANSETRON HCL 4 MG/2ML IJ SOLN
INTRAMUSCULAR | Status: DC | PRN
  Administered 2018-02-09: 4 mg via INTRAVENOUS

## 2018-02-09 MED ORDER — APIXABAN 5 MG PO TABS: 5.0000 mg | ORAL_TABLET | Freq: Two times a day (BID) | ORAL | Status: DC

## 2018-02-09 MED ORDER — SENNOSIDES-DOCUSATE SODIUM 8.6-50 MG PO TABS
1.0000 | ORAL_TABLET | Freq: Every evening | ORAL | Status: DC | PRN
  Administered 2018-02-10: 1 via ORAL
  Filled 2018-02-09: qty 1

## 2018-02-09 MED ORDER — GABAPENTIN 100 MG PO CAPS
200.0000 mg | ORAL_CAPSULE | Freq: Three times a day (TID) | ORAL | Status: DC
  Administered 2018-02-09 – 2018-02-12 (×9): 200 mg via ORAL
  Filled 2018-02-09 (×9): qty 2

## 2018-02-09 MED ORDER — DIPHENHYDRAMINE HCL 12.5 MG/5ML PO ELIX: 12.5000 mg | ORAL_SOLUTION | ORAL | Status: DC | PRN

## 2018-02-09 MED ORDER — INSULIN ASPART 100 UNIT/ML ~~LOC~~ SOLN
20.0000 [IU] | Freq: Three times a day (TID) | SUBCUTANEOUS | Status: DC
  Administered 2018-02-10 – 2018-02-11 (×3): 20 [IU] via SUBCUTANEOUS
  Filled 2018-02-09 (×3): qty 1

## 2018-02-09 MED ORDER — CARVEDILOL 3.125 MG PO TABS
6.2500 mg | ORAL_TABLET | Freq: Two times a day (BID) | ORAL | Status: DC
  Administered 2018-02-09: 6.25 mg via ORAL
  Filled 2018-02-09 (×4): qty 2

## 2018-02-09 MED ORDER — CEFAZOLIN SODIUM-DEXTROSE 2-4 GM/100ML-% IV SOLN
INTRAVENOUS | Status: AC
  Filled 2018-02-09: qty 100

## 2018-02-09 MED ORDER — EPHEDRINE SULFATE 50 MG/ML IJ SOLN
INTRAMUSCULAR | Status: DC | PRN
  Administered 2018-02-09 (×2): 10 mg via INTRAVENOUS
  Administered 2018-02-09: 20 mg via INTRAVENOUS
  Administered 2018-02-09: 10 mg via INTRAVENOUS

## 2018-02-09 MED ORDER — MORPHINE SULFATE (PF) 2 MG/ML IV SOLN: 0.5000 mg | INTRAVENOUS | Status: DC | PRN

## 2018-02-09 MED ORDER — PHENYLEPHRINE HCL 10 MG/ML IJ SOLN
INTRAMUSCULAR | Status: DC | PRN
  Administered 2018-02-09 (×3): 100 ug via INTRAVENOUS

## 2018-02-09 MED ORDER — METHOCARBAMOL 500 MG PO TABS
500.0000 mg | ORAL_TABLET | Freq: Four times a day (QID) | ORAL | Status: DC | PRN
  Administered 2018-02-10 – 2018-02-11 (×2): 500 mg via ORAL
  Filled 2018-02-09 (×2): qty 1

## 2018-02-09 MED ORDER — HYDROCODONE-ACETAMINOPHEN 7.5-325 MG PO TABS
1.0000 | ORAL_TABLET | ORAL | Status: DC | PRN
  Administered 2018-02-11: 1 via ORAL
  Filled 2018-02-09: qty 1

## 2018-02-09 MED ORDER — EPHEDRINE SULFATE 50 MG/ML IJ SOLN: 20.0000 mg | Freq: Once | INTRAMUSCULAR | Status: DC

## 2018-02-09 MED ORDER — PRAVASTATIN SODIUM 20 MG PO TABS
40.0000 mg | ORAL_TABLET | Freq: Every day | ORAL | Status: DC
Start: 2018-02-09 — End: 2018-02-12
  Administered 2018-02-09 – 2018-02-11 (×3): 40 mg via ORAL
  Filled 2018-02-09 (×3): qty 2

## 2018-02-09 MED ORDER — BISACODYL 5 MG PO TBEC
5.0000 mg | DELAYED_RELEASE_TABLET | Freq: Every day | ORAL | Status: DC | PRN
  Administered 2018-02-11 – 2018-02-12 (×2): 5 mg via ORAL
  Filled 2018-02-09 (×2): qty 1

## 2018-02-09 MED ORDER — SODIUM CHLORIDE 0.9 % IV SOLN
INTRAVENOUS | Status: DC
  Administered 2018-02-09 – 2018-02-10 (×2): via INTRAVENOUS

## 2018-02-09 MED ORDER — MAGNESIUM CITRATE PO SOLN
1.0000 | Freq: Once | ORAL | Status: DC | PRN
  Filled 2018-02-09 (×2): qty 296

## 2018-02-09 MED ORDER — ALPRAZOLAM 0.5 MG PO TABS
0.5000 mg | ORAL_TABLET | Freq: Every evening | ORAL | Status: DC | PRN
Start: 2018-02-09 — End: 2018-02-12

## 2018-02-09 MED ORDER — BUPIVACAINE-EPINEPHRINE 0.25% -1:200000 IJ SOLN
INTRAMUSCULAR | Status: DC | PRN
  Administered 2018-02-09: 30 mL

## 2018-02-09 MED ORDER — OCUVITE-LUTEIN PO CAPS
2.0000 | ORAL_CAPSULE | Freq: Every day | ORAL | Status: DC
  Administered 2018-02-09 – 2018-02-12 (×4): 2 via ORAL
  Filled 2018-02-09 (×4): qty 2

## 2018-02-09 MED ORDER — MENTHOL 3 MG MT LOZG
1.0000 | LOZENGE | OROMUCOSAL | Status: DC | PRN
  Filled 2018-02-09: qty 9

## 2018-02-09 MED ORDER — TRAMADOL HCL 50 MG PO TABS
50.0000 mg | ORAL_TABLET | Freq: Four times a day (QID) | ORAL | Status: DC
  Administered 2018-02-09 – 2018-02-12 (×8): 50 mg via ORAL
  Filled 2018-02-09 (×8): qty 1

## 2018-02-09 MED ORDER — LIDOCAINE HCL (CARDIAC) PF 100 MG/5ML IV SOSY
PREFILLED_SYRINGE | INTRAVENOUS | Status: DC | PRN
  Administered 2018-02-09: 80 mg via INTRATRACHEAL

## 2018-02-09 MED ORDER — FENTANYL CITRATE (PF) 100 MCG/2ML IJ SOLN
INTRAMUSCULAR | Status: AC
  Filled 2018-02-09: qty 2

## 2018-02-09 SURGICAL SUPPLY — 56 items
BLADE SAGITTAL AGGR TOOTH XLG (BLADE) ×3 IMPLANT
BNDG COHESIVE 6X5 TAN STRL LF (GAUZE/BANDAGES/DRESSINGS) ×9 IMPLANT
CANISTER SUCT 1200ML W/VALVE (MISCELLANEOUS) ×3 IMPLANT
CHLORAPREP W/TINT 26ML (MISCELLANEOUS) ×3 IMPLANT
DRAPE C-ARM XRAY 36X54 (DRAPES) ×3 IMPLANT
DRAPE INCISE IOBAN 66X60 STRL (DRAPES) IMPLANT
DRAPE POUCH INSTRU U-SHP 10X18 (DRAPES) ×3 IMPLANT
DRAPE SHEET LG 3/4 BI-LAMINATE (DRAPES) ×9 IMPLANT
DRAPE TABLE BACK 80X90 (DRAPES) ×3 IMPLANT
DRESSING SURGICEL FIBRLLR 1X2 (HEMOSTASIS) ×2 IMPLANT
DRSG OPSITE POSTOP 4X8 (GAUZE/BANDAGES/DRESSINGS) ×6 IMPLANT
DRSG SURGICEL FIBRILLAR 1X2 (HEMOSTASIS) ×6
ELECT BLADE 6.5 EXT (BLADE) ×3 IMPLANT
ELECT REM PT RETURN 9FT ADLT (ELECTROSURGICAL) ×3
ELECTRODE REM PT RTRN 9FT ADLT (ELECTROSURGICAL) ×1 IMPLANT
GLOVE BIOGEL PI IND STRL 9 (GLOVE) ×1 IMPLANT
GLOVE BIOGEL PI INDICATOR 9 (GLOVE) ×2
GLOVE SURG SYN 9.0  PF PI (GLOVE) ×4
GLOVE SURG SYN 9.0 PF PI (GLOVE) ×2 IMPLANT
GOWN SRG 2XL LVL 4 RGLN SLV (GOWNS) ×1 IMPLANT
GOWN STRL NON-REIN 2XL LVL4 (GOWNS) ×2
GOWN STRL REUS W/ TWL LRG LVL3 (GOWN DISPOSABLE) ×1 IMPLANT
GOWN STRL REUS W/TWL LRG LVL3 (GOWN DISPOSABLE) ×2
HEMOVAC 400CC 10FR (MISCELLANEOUS) IMPLANT
HIP FEM HD L 28 (Head) ×3 IMPLANT
HOLDER FOLEY CATH W/STRAP (MISCELLANEOUS) ×3 IMPLANT
HOOD PEEL AWAY FLYTE STAYCOOL (MISCELLANEOUS) ×3 IMPLANT
KIT PREVENA INCISION MGT 13 (CANNISTER) ×6 IMPLANT
LINER DM 28MM (Liner) ×3 IMPLANT
MAT BLUE FLOOR 46X72 FLO (MISCELLANEOUS) ×3 IMPLANT
NDL SAFETY ECLIPSE 18X1.5 (NEEDLE) ×1 IMPLANT
NEEDLE HYPO 18GX1.5 SHARP (NEEDLE) ×2
NEEDLE SPNL 18GX3.5 QUINCKE PK (NEEDLE) ×3 IMPLANT
NS IRRIG 1000ML POUR BTL (IV SOLUTION) ×3 IMPLANT
PACK HIP COMPR (MISCELLANEOUS) ×3 IMPLANT
SCALPEL PROTECTED #10 DISP (BLADE) ×6 IMPLANT
SHELL ACETABULAR DM  48MM (Shell) ×3 IMPLANT
SOL PREP PVP 2OZ (MISCELLANEOUS) ×3
SOLUTION PREP PVP 2OZ (MISCELLANEOUS) ×1 IMPLANT
SPONGE DRAIN TRACH 4X4 STRL 2S (GAUZE/BANDAGES/DRESSINGS) ×3 IMPLANT
STAPLER SKIN PROX 35W (STAPLE) ×3 IMPLANT
STEM FEMORAL 1 STD COLLARED (Stem) ×3 IMPLANT
STRAP SAFETY 5IN WIDE (MISCELLANEOUS) ×3 IMPLANT
SUT DVC 2 QUILL PDO  T11 36X36 (SUTURE) ×2
SUT DVC 2 QUILL PDO T11 36X36 (SUTURE) ×1 IMPLANT
SUT SILK 0 (SUTURE) ×2
SUT SILK 0 30XBRD TIE 6 (SUTURE) ×1 IMPLANT
SUT V-LOC 90 ABS DVC 3-0 CL (SUTURE) ×3 IMPLANT
SUT VIC AB 1 CT1 36 (SUTURE) ×3 IMPLANT
SYR 20CC LL (SYRINGE) ×3 IMPLANT
SYR 30ML LL (SYRINGE) ×3 IMPLANT
SYR BULB IRRIG 60ML STRL (SYRINGE) ×3 IMPLANT
TAPE MICROFOAM 4IN (TAPE) ×3 IMPLANT
TOWEL OR 17X26 4PK STRL BLUE (TOWEL DISPOSABLE) ×3 IMPLANT
TRAY FOLEY MTR SLVR 16FR STAT (SET/KITS/TRAYS/PACK) ×3 IMPLANT
WND VAC CANISTER 500ML (MISCELLANEOUS) ×6 IMPLANT

## 2018-02-09 NOTE — Anesthesia Post-op Follow-up Note (Signed)
Anesthesia QCDR form completed.        

## 2018-02-09 NOTE — Progress Notes (Signed)
Pt admitted to room 155 from PACU. Pt is A&Ox4. Hemovac and wound vac in place to left hip. Pt and family educated on pain medication regimen and plan of care. IVF infusing, VSS.   [place], Jerry Caras

## 2018-02-09 NOTE — Anesthesia Procedure Notes (Addendum)
Procedure Name: Intubation Date/Time: 02/09/2018 2:02 PM Performed by: Aline Brochure, CRNA Pre-anesthesia Checklist: Patient identified, Emergency Drugs available, Suction available and Patient being monitored Patient Re-evaluated:Patient Re-evaluated prior to induction Oxygen Delivery Method: Circle system utilized Preoxygenation: Pre-oxygenation with 100% oxygen Induction Type: IV induction Ventilation: Oral airway inserted - appropriate to patient size and Two handed mask ventilation required Laryngoscope Size: Miller and 2 Grade View: Grade I Tube type: Oral Tube size: 7.0 mm Number of attempts: 1 Airway Equipment and Method: Stylet Placement Confirmation: ETT inserted through vocal cords under direct vision,  positive ETCO2 and breath sounds checked- equal and bilateral Secured at: 21 cm Tube secured with: Tape Dental Injury: Teeth and Oropharynx as per pre-operative assessment

## 2018-02-09 NOTE — Transfer of Care (Signed)
Immediate Anesthesia Transfer of Care Note  Patient: Amber [name]  Procedure(s) Performed: TOTAL HIP ARTHROPLASTY ANTERIOR APPROACH (Left )  Patient Location: PACU  Anesthesia Type:General  Level of Consciousness: sedated  Airway & Oxygen Therapy: Patient connected to face mask oxygen  Post-op Assessment: Post -op Vital signs reviewed and stable  Post vital signs: stable  Last Vitals:  Vitals Value Taken Time  BP    Temp    Pulse    Resp    SpO2      Last Pain:  Vitals:   02/09/18 1647  TempSrc: Oral  PainSc:          Complications: No apparent anesthesia complications

## 2018-02-09 NOTE — Op Note (Signed)
02/09/2018  3:22 PM  PATIENT:  Amber [name]  84 y.o. female  PRE-OPERATIVE DIAGNOSIS:  PRIMARY OSTEOARTHRITIS OF LEFT HIP  POST-OPERATIVE DIAGNOSIS: Avascular necrosis left hip  PROCEDURE:  Procedure(s): TOTAL HIP ARTHROPLASTY ANTERIOR APPROACH (Left)  SURGEON: Laurene Footman, MD  ASSISTANTS: None  ANESTHESIA:   general  EBL:  No intake/output data recorded.  BLOOD ADMINISTERED:none  DRAINS: (2) Hemovact drain(s) in the And the one in the subcutaneous layer 1 in the joint with  Suction Open   LOCAL MEDICATIONS USED:  MARCAINE     SPECIMEN:  Source of Specimen:  Left femoral head  DISPOSITION OF SPECIMEN:  PATHOLOGY  COUNTS:  YES  TOURNIQUET:  * No tourniquets in log *  IMPLANTS: Medact AMIS 1 standard with L 28 mm metal head, 48 mm Mpact DM cup and liner  DICTATION: .Dragon Dictation   The patient was brought to the operating room and after spinal anesthesia was obtained patient was placed on the operative table with the ipsilateral foot into the Medacta attachment, contralateral leg on a well-padded table. C-arm was brought in and preop template x-ray taken. After prepping and draping in usual sterile fashion appropriate patient identification and timeout procedures were completed. Anterior approach to the hip was obtained and centered over the greater trochanter and TFL muscle. The subcutaneous tissue was incised hemostasis being achieved by electrocautery. TFL fascia was incised and the muscle retracted laterally deep retractor placed. The lateral femoral circumflex vessels were identified and ligated. The anterior capsule was exposed and a capsulotomy performed. The neck was identified and a femoral neck cut carried out with a saw. The head was removed without difficulty and showed sclerotic femoral head and acetabulum. Reaming was carried out to 48 mm and a 48 mm cup trial gave appropriate tightness to the acetabular component a 48 mm cup was impacted into position. The  leg was then externally rotated and ischiofemoral and pubofemoral releases carried out. The femur was sequentially broached to a size 1, size 1 standard with S and then L heads trials were placed and the final components chosen. The one standard stem was inserted along with a metal L 28 mm head and the mm liner. The hip was reduced and was stable the wound was thoroughly irrigated with a dilute Betadine solution. The deep fascia closed using a heavy Quill after infiltration of 30 cc of quarter percent Sensorcaine with epinephrine. Subcutaneous drains were then inserted 3-0 v-loc to close the skin with skin staples. Wound VAC applied   PLAN OF CARE: Admit to inpatient

## 2018-02-09 NOTE — Anesthesia Preprocedure Evaluation (Signed)
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 on medications +CHF  + pacemaker (-) Valvular Problems/Murmurs     Neuro/Psych Seizures -, Well Controlled,  Anxiety TIA   GI/Hepatic Neg liver ROS, GERD  Medicated and Controlled,  Endo/Other  diabetes, Type 2, Oral Hypoglycemic Agents  Renal/GU Renal InsufficiencyRenal disease     Musculoskeletal   Abdominal   Peds  Hematology   Anesthesia Other Findings   Reproductive/Obstetrics                             Anesthesia Physical Anesthesia Plan  ASA: III  Anesthesia Plan: Spinal   Post-op Pain Management:    Induction:   PONV Risk Score and Plan:   Airway Management Planned:   Additional Equipment:   Intra-op Plan:   Post-operative Plan:   Informed Consent: I have reviewed the patients History and Physical, chart, labs and discussed the procedure including the risks, benefits and alternatives for the proposed anesthesia with the patient or authorized representative who has indicated his/her understanding and acceptance.     Plan Discussed with:   Anesthesia Plan Comments:         Anesthesia Quick Evaluation

## 2018-02-09 NOTE — Progress Notes (Signed)
ANTICOAGULATION CONSULT NOTE - Initial Consult  Pharmacy Consult for Apixaban  Indication: atrial fibrillation  Allergies  Allergen Reactions  . Ambien [Zolpidem Tartrate] Other (See Comments)    Reaction:  Keeps pt awake   . Penicillins Itching and Other (See Comments)    Has patient had a PCN reaction causing immediate rash, facial/tongue/throat swelling, SOB or lightheadedness with hypotension: No Has patient had a PCN reaction causing severe rash involving mucus membranes or skin necrosis: No Has patient had a PCN reaction that required hospitalization No Has patient had a PCN reaction occurring within the last 10 years: No If all of the above answers are "NO", then may proceed with Cephalosporin use.  . Iodine Itching  . Succinylcholine Other (See Comments)    Reaction:  Unknown   . Etodolac Other (See Comments)    GI upset  . Nsaids Other (See Comments)    Reaction: gi upset    Patient Measurements: Height: 4\' 11"  (149.9 cm) Weight: 161 lb (73 kg) IBW/kg (Calculated) : 43.2 Heparin Dosing Weight:   Vital Signs: Temp: 97.4 F (36.3 C) (08/06 1647) Temp Source: Oral (08/06 1647) BP: 123/79 (08/06 1647) Pulse Rate: 59 (08/06 1647)  Labs: Recent Labs    02/08/18 1530  HGB 13.9  HCT 39.9  PLT 226  APTT 31  LABPROT 12.8  INR 0.97  CREATININE 1.01*    Estimated Creatinine Clearance: 35.4 mL/min (A) (by C-G formula based on SCr of 1.01 mg/dL (H)).   Medical History: Past Medical History:  Diagnosis Date  . Anemia   . Anxiety   . Arthritis   . Benign neoplasm of colon   . CHF (congestive heart failure) ([place])   . Chronic kidney disease   . Chronic pain   . Complication of anesthesia 1980's   hard time waking up  . Coronary artery disease with unspecified angina pectoris   . Cough   . Diabetes mellitus without complication ([place])   . Essential hypertension   . High risk medication use   . Hyperlipidemia   . Plantar fascial fibromatosis   . Presence of  permanent cardiac pacemaker   . Shortness of breath dyspnea     Medications:  Medications Prior to Admission  Medication Sig Dispense Refill Last Dose  . albuterol (PROVENTIL HFA;VENTOLIN HFA) 108 (90 Base) MCG/ACT inhaler Inhale 2 puffs into the lungs every 6 (six) hours as needed for wheezing or shortness of breath. 1 Inhaler 0 02/09/2018 at Unknown time  . alendronate (FOSAMAX) 70 MG tablet Take 70 mg by mouth every Thursday.    02/08/2018 at Unknown time  . ALPRAZolam (XANAX) 1 MG tablet Take 1 tablet (1 mg total) by mouth at bedtime as needed for anxiety. (Patient taking differently: Take 0.5 mg by mouth at bedtime as needed for anxiety. ) 30 tablet 0 Past Month at Unknown time  . amLODipine (NORVASC) 10 MG tablet Take 5 mg by mouth daily.    02/09/2018 at Unknown time  . apixaban (ELIQUIS) 5 MG TABS tablet Take 5 mg by mouth 2 (two) times daily.   Past Month at Unknown time  . carvedilol (COREG) 6.25 MG tablet Take 1 tablet (6.25 mg total) by mouth 2 (two) times daily with a meal. 60 tablet 0 02/09/2018 at Unknown time  . furosemide (LASIX) 20 MG tablet TAKE ONE TABLET BY MOUTH ONCE DAILY 90 tablet 0 02/08/2018 at Unknown time  . gabapentin (NEURONTIN) 100 MG capsule Take 200 mg by mouth 3 (three) times  daily.    02/09/2018 at Unknown time  . insulin regular (NOVOLIN R,HUMULIN R) 100 units/mL injection Inject 20 Units into the skin 3 (three) times daily before meals. Twice a day per sliding scale (greater than 170)   02/09/2018 at Unknown time  . levETIRAcetam (KEPPRA) 500 MG tablet Take 500 mg by mouth 2 (two) times daily.    02/09/2018 at Unknown time  . Multiple Vitamins-Minerals (PRESERVISION AREDS 2) CAPS Take 1 capsule by mouth daily.   02/08/2018 at Unknown time  . omeprazole (PRILOSEC) 20 MG capsule Take 20 mg by mouth daily.   02/09/2018 at Unknown time  . pravastatin (PRAVACHOL) 40 MG tablet Take 40 mg by mouth at bedtime.   02/08/2018 at Unknown time  . sacubitril-valsartan (ENTRESTO) 24-26 MG Take 1  tablet by mouth 2 (two) times daily.   02/08/2018 at Unknown time  . traMADol (ULTRAM) 50 MG tablet Take 1 tablet (50 mg total) by mouth every 6 (six) hours as needed. 15 tablet 0 02/09/2018 at Unknown time  . acetaminophen (TYLENOL) 500 MG tablet Take 1,000 mg by mouth every 6 (six) hours as needed.     . calcium carbonate (OSCAL) 1500 (600 Ca) MG TABS tablet Take 600 mg of elemental calcium by mouth daily with breakfast.   Unknown  . insulin aspart protamine- aspart (NOVOLOG MIX 70/30) (70-30) 100 UNIT/ML injection Inject 0.15 mLs (15 Units total) into the skin 2 (two) times daily with a meal. (Patient not taking: Reported on 02/08/2018) 10 mL 11 Not Taking at Unknown time  . Menthol-Camphor (ICY HOT ARTHRITIS PAIN RELIEF) 16-4 % LOTN Apply 1 application topically 2 (two) times daily as needed. (Patient not taking: Reported on 02/08/2018) 1 Bottle 0 Not Taking at Unknown time    Assessment: Pt s/p hip replacement ,  On apixaban 5 mg PO BID at home.   Goal of Therapy:  prevention of thromboembolism  Monitor platelets by anticoagulation protocol: Yes   Plan:  Will order Apixaban 2.5 mg PO BID X 2 doses to start 8/7 and resume full home dose of Apixaban 5 mg PO BID on 8/8.   Zeplin Aleshire D 02/09/2018,5:02 PM

## 2018-02-09 NOTE — H&P (Signed)
Reviewed paper H+P, will be scanned into chart. No changes noted.  

## 2018-02-10 ENCOUNTER — Encounter: Payer: Self-pay | Admitting: Orthopedic Surgery

## 2018-02-10 LAB — BASIC METABOLIC PANEL
Anion gap: 8 (ref 5–15)
BUN: 24 mg/dL — AB (ref 8–23)
CALCIUM: 8.8 mg/dL — AB (ref 8.9–10.3)
CO2: 29 mmol/L (ref 22–32)
CREATININE: 1.07 mg/dL — AB (ref 0.44–1.00)
Chloride: 101 mmol/L (ref 98–111)
GFR calc non Af Amer: 46 mL/min — ABNORMAL LOW (ref >60)
GFR, EST AFRICAN AMERICAN: 53 mL/min — AB (ref >60)
Glucose, Bld: 204 mg/dL — ABNORMAL HIGH (ref 70–99)
Potassium: 4.9 mmol/L (ref 3.5–5.1)
Sodium: 138 mmol/L (ref 135–145)

## 2018-02-10 LAB — GLUCOSE, CAPILLARY
GLUCOSE-CAPILLARY: 102 mg/dL — AB (ref 70–99)
GLUCOSE-CAPILLARY: 259 mg/dL — AB (ref 70–99)
Glucose-Capillary: 209 mg/dL — ABNORMAL HIGH (ref 70–99)
Glucose-Capillary: 137 mg/dL — ABNORMAL HIGH (ref 70–99)

## 2018-02-10 LAB — CBC
HEMATOCRIT: 34.9 % — AB (ref 35.0–47.0)
Hemoglobin: 11.7 g/dL — ABNORMAL LOW (ref 12.0–16.0)
MCH: 30.1 pg (ref 26.0–34.0)
MCHC: 33.6 g/dL (ref 32.0–36.0)
MCV: 89.6 fL (ref 80.0–100.0)
Platelets: 182 10*3/uL (ref 150–440)
RBC: 3.9 MIL/uL (ref 3.80–5.20)
RDW: 16.4 % — AB (ref 11.5–14.5)
WBC: 8.3 10*3/uL (ref 3.6–11.0)

## 2018-02-10 LAB — URINE CULTURE
Culture: NO GROWTH
SPECIAL REQUESTS: NORMAL

## 2018-02-10 NOTE — Progress Notes (Signed)
   Subjective: 1 Day Post-Op Procedure(s) (LRB): TOTAL HIP ARTHROPLASTY ANTERIOR APPROACH (Left) Patient reports pain as moderate.   Patient is well, and has had no acute complaints or problems Denies any CP, SOB, ABD pain. We will start therapy today.   Objective: Vital signs in last 24 hours: Temp:  [97.1 F (36.2 C)-98.4 F (36.9 C)] 98.4 F (36.9 C) (08/07 0752) Pulse Rate:  [59-62] 60 (08/07 0752) Resp:  [12-19] 17 (08/07 0752) BP: (85-145)/(49-79) 109/49 (08/07 0752) SpO2:  [89 %-100 %] 95 % (08/07 0752) Weight:  [73 kg (161 lb)] 73 kg (161 lb) (08/06 1127)  Intake/Output from previous day: 08/06 0701 - 08/07 0700 In: 684.2 [P.O.:360; I.V.:324.2] Out: 120 [Drains:120] Intake/Output this shift: No intake/output data recorded.  Recent Labs    02/08/18 1530 02/10/18 0552  HGB 13.9 11.7*   Recent Labs    02/08/18 1530 02/10/18 0552  WBC 7.1 8.3  RBC 4.55 3.90  HCT 39.9 34.9*  PLT 226 182   Recent Labs    02/08/18 1530 02/10/18 0552  NA 135 138  K 4.1 4.9  CL 95* 101  CO2 27 29  BUN 21 24*  CREATININE 1.01* 1.07*  GLUCOSE 126* 204*  CALCIUM 9.6 8.8*   Recent Labs    02/08/18 1530  INR 0.97    EXAM General - Patient is Alert, Appropriate and Oriented Extremity - Neurovascular intact Sensation intact distally Intact pulses distally Incision: dressing C/D/I and no drainage No cellulitis present Compartment soft Dressing - dressing C/D/I, Hemovac intact, wound VAC intact Motor Function - intact, moving foot and toes well on exam.   Past Medical History:  Diagnosis Date  . Anemia   . Anxiety   . Arthritis   . Benign neoplasm of colon   . CHF (congestive heart failure) ([place])   . Chronic kidney disease   . Chronic pain   . Complication of anesthesia 1980's   hard time waking up  . Coronary artery disease with unspecified angina pectoris   . Cough   . Diabetes mellitus without complication ([place])   . Essential hypertension   . High risk  medication use   . Hyperlipidemia   . Plantar fascial fibromatosis   . Presence of permanent cardiac pacemaker   . Shortness of breath dyspnea     Assessment/Plan:   1 Day Post-Op Procedure(s) (LRB): TOTAL HIP ARTHROPLASTY ANTERIOR APPROACH (Left) Active Problems:   Status post total hip replacement, left  Estimated body mass index is 32.52 kg/m as calculated from the following:   Height as of this encounter: 4\' 11"  (1.499 m).   Weight as of this encounter: 73 kg (161 lb). Advance diet Up with therapy  Needs bowel movement Remove Hemovac tomorrow Labs and vital signs are within normal limits, recheck labs in the morning Care management to assist with discharge   DVT Prophylaxis - TED hose and Eliquis, SCDs Weight-Bearing as tolerated to left leg   T. Rachelle Hora, PA-C [place] 02/10/2018, 8:08 AM

## 2018-02-10 NOTE — Evaluation (Signed)
Physical Therapy Evaluation Patient Details Name: Amber [name] MRN: [id number] DOB: 04/04/1930 Today's Date: 02/10/2018   History of Present Illness  "Amber [name]" Gruel is an 84yo female who comes [facility] for elective Lt THA in the setting of AVN, direct anterio aproach c Dr. Hessie Knows. PMH: GAD, anemia, OA, CAD, DM, HTN, HLD, PPM, Ankle Fracture, drop out episodes per patient now on antiepileptic meds.   Clinical Impression  Pt admitted with above diagnosis. Pt currently with functional limitations due to the deficits listed below (see "PT Problem List"). Upon entry, pt at EOB leaning on HOB asleep, no family/caregiver present. Pt needs totalA physical assist for sitting upright and erect at EOB, then c/o head "feeling funny" which does not relent in time: pt reports she does not feel presyncopal. Pt assisted with STS and then SPT to chair, minA required, then BP seated noted to be quite low, only slightly better 60 seconds later after sitting. Functional mobility assessment demonstrates increased effort/time requirements, poor tolerance, and need for physical assistance, whereas the patient performed these at a higher level of independence PTA. Pt will benefit from skilled PT intervention to increase independence and safety with basic mobility in preparation for discharge to the venue listed below.       Follow Up Recommendations SNF;Supervision for mobility/OOB    Equipment Recommendations  None recommended by PT    Recommendations for Other Services       Precautions / Restrictions Precautions Precautions: None Restrictions Weight Bearing Restrictions: Yes LLE Weight Bearing: Weight bearing as tolerated      Mobility  Bed Mobility               General bed mobility comments: received seated at EOB leaning laterally on the elevated HOB; unable to right trunk to upright with total A physical assistance.   Transfers Overall transfer level: Needs  assistance Equipment used: Rolling walker (2 wheeled) Transfers: Sit to/from [facility] Sit to Stand: Min assist Stand pivot transfers: Mod assist          Ambulation/Gait Ambulation/Gait assistance: (pt dizzy and hypotensive)              Stairs            Wheelchair Mobility    Modified Rankin (Stroke Patients Only)       Balance Overall balance assessment: Mild deficits observed, not formally tested                                           Pertinent Vitals/Pain Pain Assessment: Faces Faces Pain Scale: Hurts even more Pain Location: Left hip, with movement and weight bearing only, well controlled at rest.  Pain Descriptors / Indicators: Aching Pain Intervention(s): Limited activity within patient's tolerance;Monitored during session;Premedicated before session    Home Living Family/patient expects to be discharged to:: Private residence Living Arrangements: Children;Other relatives(son, DIL, grandaguhter and granddaughter's boyfriend)   Type of Home: Mobile home Home Access: Ramped entrance     Home Layout: One level Home Equipment: Walker - 2 wheels;Walker - standard;Wheelchair - manual;Cane - single point;Shower seat;Bedside commode Additional Comments: uses WC for household mobility >5years, performs short distance AMB fro transfers independently, holding onto furniture.     Prior Function Level of Independence: Needs assistance   Gait / Transfers Assistance Needed: son assists with WC on ramp and out of house  ADL's / Homemaking Assistance Needed: assist for home making, independent transfer and WC in home; intermittent help with set-up during bathing         Hand Dominance   Dominant Hand: Right    Extremity/Trunk Assessment   Upper Extremity Assessment Upper Extremity Assessment: Generalized weakness    Lower Extremity Assessment Lower Extremity Assessment: Generalized weakness        Communication      Cognition Arousal/Alertness: Awake/alert Behavior During Therapy: WFL for tasks assessed/performed Overall Cognitive Status: Within Functional Limits for tasks assessed                                        General Comments      Exercises     Assessment/Plan    PT Assessment Patient needs continued PT services  PT Problem List Decreased strength;Decreased range of motion;Decreased activity tolerance;Decreased balance;Decreased mobility;Decreased coordination;Pain;Decreased safety awareness;Decreased knowledge of use of DME;Obesity       PT Treatment Interventions DME instruction;Balance training;Gait training;Stair training;Functional mobility training;Therapeutic activities;Therapeutic exercise;Patient/family education    PT Goals (Current goals can be found in the Care Plan section)  Acute Rehab PT Goals Patient Stated Goal: not feel lightheaded PT Goal Formulation: With patient Time For Goal Achievement: 02/24/18 Potential to Achieve Goals: Good    Frequency BID   Barriers to discharge        Co-evaluation               AM-PAC PT "6 Clicks" Daily Activity  Outcome Measure Difficulty turning over in bed (including adjusting bedclothes, sheets and blankets)?: Unable Difficulty moving from lying on back to sitting on the side of the bed? : Unable Difficulty sitting down on and standing up from a chair with arms (e.g., wheelchair, bedside commode, etc,.)?: Unable Help needed moving to and from a bed to chair (including a wheelchair)?: Total Help needed walking in hospital room?: Total Help needed climbing 3-5 steps with a railing? : Total 6 Click Score: 6    End of Session   Activity Tolerance: Treatment limited secondary to medical complications (Comment);Patient limited by pain;Patient limited by fatigue(orthostatic and lightheaded. ) Patient left: in chair;with call bell/phone within reach;with chair alarm set;with SCD's  reapplied Nurse Communication: Mobility status;Other (comment)(BP ) PT Visit Diagnosis: Difficulty in walking, not elsewhere classified (R26.2);Muscle weakness (generalized) (M62.81);Dizziness and giddiness (R42);Other abnormalities of gait and mobility (R26.89)    Time: 7001-7494 PT Time Calculation (min) (ACUTE ONLY): 24 min   Charges:   PT Evaluation $PT Eval High Complexity: 1 High PT Treatments $Therapeutic Activity: 8-22 mins       10:33 AM, 02/10/18 Etta Grandchild, PT, DPT Physical Therapist - [facility]  [phone] ([place])    Buccola,Allan C 02/10/2018, 10:30 AM

## 2018-02-10 NOTE — Progress Notes (Signed)
OT Cancellation Note  Patient Details Name: Amber [name] MRN: [id number] DOB: [birth year]   Cancelled Treatment:    Reason Eval/Treat Not Completed: Other (comment). Order received, chart reviewed. Pt working with PT upon attempt. Will re-attempt OT evaluation at later date/time as pt is available and medically appropriate.   Jeni Salles, MPH, MS, OTR/L ascom [phone] 02/10/18, 2:08 PM

## 2018-02-10 NOTE — Anesthesia Postprocedure Evaluation (Signed)
Anesthesia Post Note  Patient: Amber [name]  Procedure(s) Performed: TOTAL HIP ARTHROPLASTY ANTERIOR APPROACH (Left )  Patient location during evaluation: Other Anesthesia Type: Spinal Level of consciousness: oriented and awake and alert Pain management: pain level controlled Vital Signs Assessment: post-procedure vital signs reviewed and stable Respiratory status: spontaneous breathing, respiratory function stable and patient connected to nasal cannula oxygen Cardiovascular status: blood pressure returned to baseline and stable Postop Assessment: no headache, no backache and no apparent nausea or vomiting Anesthetic complications: no     Last Vitals:  Vitals:   02/09/18 2332 02/10/18 0421  BP: (!) 112/59 (!) 116/54  Pulse: (!) 59 60  Resp: 16 16  Temp: 36.5 C 36.5 C  SpO2: 100% 98%    Last Pain:  Vitals:   02/10/18 0421  TempSrc: Oral  PainSc:                  Alison Stalling

## 2018-02-10 NOTE — Progress Notes (Signed)
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 Hessie Knows. PMH: GAD, anemia, OA, CAD, DM, HTN, HLD, PPM, Ankle Fracture, drop out episodes per patient now on antiepileptic meds.     PT Comments    PT returning for 2nd session this date. Dizziness not a limiting factor this session. Continued low confidence with taught DME use and mobility strategies. Pain limiting exercises, still requiring some mild assist. Transfers require significantly elevated surface height relative to patient height. Limited tolerance is standing, with lack of familiarity in standing tall and erect. DIL and Son in room watching session, providing some encouragement occasionally. Pt progressing slowly. Will continue to follow.      Follow Up Recommendations  SNF;Supervision for mobility/OOB     Equipment Recommendations  None recommended by PT    Recommendations for Other Services       Precautions / Restrictions Precautions Precautions: None Restrictions LLE Weight Bearing: Weight bearing as tolerated    Mobility  Bed Mobility Overal bed mobility: Needs Assistance Bed Mobility: Sit to Supine           General bed mobility comments: Assistance with operativ eleg, but eventually also needs assistance with non operative leg as well. Pt manages trunk.   Transfers Overall transfer level: Needs assistance Equipment used: Rolling walker (2 wheeled) Transfers: Sit to/from [facility] Sit to Stand: Min guard;Supervision(practics from EOB 5 times, elevating bed to a height that allows for supervison level performance. ) Stand pivot transfers: Max assist(poor committment to RW, continues to deviate back to fixed objects when confidence wanes; Heavy VC to  maintain hands on RW, eventually gives out and collapses onto EOB, unable to remain standing. )          Ambulation/Gait Ambulation/Gait assistance: (still deferred, poor tolerance to prolonged standing; low confidence in RW  (accustomed to standard walker use or furniture) )               Stairs             Wheelchair Mobility    Modified Rankin (Stroke Patients Only)       Balance Overall balance assessment: Mild deficits observed, not formally tested                                          Cognition Arousal/Alertness: Awake/alert Behavior During Therapy: WFL for tasks assessed/performed Overall Cognitive Status: Within Functional Limits for tasks assessed                                        Exercises Total Joint Exercises Long Arc Quad: AROM;AAROM;Left;10 reps(poor tolerance to A/ROM d/t pain; assistance provided for pain relief. )    General Comments        Pertinent Vitals/Pain Pain Assessment: Faces Faces Pain Scale: Hurts little more Pain Location: still close to no pain at rest, less with movement compared to AM session.  Pain Descriptors / Indicators: Aching;Sore Pain Intervention(s): Limited activity within patient's tolerance;Monitored during session;Premedicated before session    Home Living  Prior Function            PT Goals (current goals can now be found in the care plan section) Acute Rehab PT Goals Patient Stated Goal: not feel lightheaded PT Goal Formulation: With patient Time For Goal Achievement: 02/24/18 Potential to Achieve Goals: Good Progress towards PT goals: Progressing toward goals    Frequency    BID      PT Plan Current plan remains appropriate    Co-evaluation              AM-PAC PT "6 Clicks" Daily Activity  Outcome Measure  Difficulty turning over in bed (including adjusting bedclothes, sheets and blankets)?: Unable Difficulty  moving from lying on back to sitting on the side of the bed? : Unable Difficulty sitting down on and standing up from a chair with arms (e.g., wheelchair, bedside commode, etc,.)?: Unable Help needed moving to and from a bed to chair (including a wheelchair)?: A Lot Help needed walking in hospital room?: Total Help needed climbing 3-5 steps with a railing? : Total 6 Click Score: 7    End of Session   Activity Tolerance: Patient tolerated treatment well;Patient limited by fatigue Patient left: in bed;with family/visitor present;with call bell/phone within reach;with SCD's reapplied Nurse Communication: Mobility status PT Visit Diagnosis: Difficulty in walking, not elsewhere classified (R26.2);Muscle weakness (generalized) (M62.81);Dizziness and giddiness (R42);Other abnormalities of gait and mobility (R26.89)     Time: 6384-6659 PT Time Calculation (min) (ACUTE ONLY): 23 min  Charges:  $Therapeutic Exercise: 8-22 mins $Therapeutic Activity: 8-22 mins                     2:49 PM, 02/10/18 Etta Grandchild, PT, DPT Physical Therapist - [facility]  [phone] ([place])     Buccola,Allan C 02/10/2018, 2:47 PM

## 2018-02-10 NOTE — Progress Notes (Signed)
Inpatient Diabetes Program Recommendations  AACE/ADA: New Consensus Statement on Inpatient Glycemic Control (2019)  Target Ranges:  Prepandial:   less than 140 mg/dL      Peak postprandial:   less than 180 mg/dL (1-2 hours)      Critically ill patients:  140 - 180 mg/dL   Results for TANIJA, GERMANI (MRN [id number]) as of 02/10/2018 11:49  Ref. Range 02/09/2018 11:45 02/09/2018 15:27 02/09/2018 17:21 02/10/2018 08:21  Glucose-Capillary Latest Ref Range: 70 - 99 mg/dL 117 (H) 121 (H) 84 209 (H)   Review of Glycemic Control  Diabetes history: DM2 Outpatient Diabetes medications: Novolog 70/30 15 units BID (per home med list, notes pt is NOT TAKING; ran out), Regular 20 units TID with meals Current orders for Inpatient glycemic control: 70/30 15 units BID, Novolog 20 units TID with meals  Inpatient Diabetes Program Recommendations:  Insulin - Basal: Noted patient refused 70/30 insulin this morning and per home medication list patient is NOT TAKING 70/30 at home. Please consider discontinuing 70/30. If glucose is consistently greater than 180 mg/dl with Novolog correction, would recommend using low dose Levemir (such as 5-7 units Q24H)_. Correction (SSI): Please discontinue Novolog 20 units TID with meals. Please consider using Glycemic Control order set to order CBGs with Novolog 0-15 units TID with meals and Novolog 0-5 units QHS.  Thanks, Barnie Alderman, RN, MSN, CDE Diabetes Coordinator Inpatient Diabetes Program [phone] (Team Pager from 8am to 5pm)

## 2018-02-10 NOTE — Plan of Care (Signed)
  Problem: Education: Goal: Knowledge of General Education information will improve Description Including pain rating scale, medication(s)/side effects and non-pharmacologic comfort measures Outcome: Progressing   

## 2018-02-10 NOTE — Clinical Social Work Placement (Signed)
   CLINICAL SOCIAL WORK PLACEMENT  NOTE  Date:  02/10/2018  Patient Details  Name: Amber [name] MRN: [id number] Date of Birth: 1929-12-03  Clinical Social Work is seeking post-discharge placement for this patient at the [place] level of care (*CSW will initial, date and re-position this form in  chart as items are completed):  Yes   Patient/family provided with [facility] Work Department's list of facilities offering this level of care within the geographic area requested by the patient (or if unable, by the patient's family).  Yes   Patient/family informed of their freedom to choose among providers that offer the needed level of care, that participate in Medicare, Medicaid or managed care program needed by the patient, have an available bed and are willing to accept the patient.  Yes   Patient/family informed of [facility]'s ownership interest in [facility] and [facility], as well as of the fact that they are under no obligation to receive care at these facilities.  PASRR submitted to EDS on       PASRR number received on       Existing PASRR number confirmed on 02/10/18     FL2 transmitted to all facilities in geographic area requested by pt/family on 02/10/18     FL2 transmitted to all facilities within larger geographic area on       Patient informed that his/her managed care company has contracts with or will negotiate with certain facilities, including the following:            Patient/family informed of bed offers received.  Patient chooses bed at       Physician recommends and patient chooses bed at      Patient to be transferred to   on  .  Patient to be transferred to facility by       Patient family notified on   of transfer.  Name of family member notified:        PHYSICIAN       Additional Comment:    _______________________________________________ Notnamed Croucher, Veronia Beets, LCSW 02/10/2018, 11:14 AM

## 2018-02-10 NOTE — Evaluation (Signed)
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 Hessie Knows. PMH: GAD, anemia, OA, CAD, DM, HTN, HLD, PPM, Ankle Fracture, drop out episodes per patient now on antiepileptic meds.    Clinical Impression   Pt seen for OT evaluation this date, POD#1 from above surgery. Pt was requiring assist from daughter in law for dressing, PRN set up for bathing, and assist for IADL including driving longer distances, meals, and housekeeping task prior to surgery, and using a w/c for mobility primarily but able to perform transfers. Pt is eager to return to PLOF with less pain and improved safety and independence. Pt currently requires max assist for LB dressing and bathing while in bed due to 8/10 pain and limited AROM of L hip. >8 minutes spent providing instruction to Pt/dtr-in-law/son in self care skills, falls prevention strategies including footwear and pet care considerations, home/routines modifications, DME/AE for LB bathing and dressing tasks, and compression stocking mgt strategies. Pt would benefit from additional instruction in self care skills and techniques to help maintain precautions with or without assistive devices to support recall and carryover prior to discharge. Recommend STR upon discharge.      Follow Up Recommendations  SNF    Equipment Recommendations  Other (comment)(TBD)    Recommendations for Other Services       Precautions / Restrictions Precautions Precautions: Fall Restrictions Weight Bearing Restrictions: Yes LLE Weight Bearing: Weight bearing as tolerated      Mobility Bed Mobility           General bed mobility comments: unable to attempt 2/2 pain  Transfers       General transfer comment: unable to attempt 2/2  pain    Balance                                         ADL either performed or assessed with clinical judgement   ADL Overall ADL's : Needs assistance/impaired Eating/Feeding: Sitting;Independent   Grooming: Sitting;Independent   Upper Body Bathing: Minimal assistance;Bed level;Moderate assistance   Lower Body Bathing: Bed level;Maximal assistance   Upper Body Dressing : Bed level;Minimal assistance;Moderate assistance   Lower Body Dressing: Bed level;Maximal assistance     Toilet Transfer Details (indicate cue type and reason): unable to attempt 2/2 pain                 Vision Baseline Vision/History: Wears glasses Wears Glasses: At all times Patient Visual Report: No change from baseline       Perception     Praxis      Pertinent Vitals/Pain Pain Assessment: 0-10 Pain Score: 8  Faces Pain Scale: Hurts little more Pain Location: 8/10 pain in L hip at rest following PT session Pain Descriptors / Indicators: Aching;Sore;Operative site guarding;Grimacing Pain Intervention(s): Limited activity within patient's tolerance;Monitored during session;Repositioned;Other (comment)(pt declined OT to tell RN, states she will tell RN soon)     Hand Dominance Right   Extremity/Trunk Assessment Upper Extremity Assessment Upper Extremity Assessment: Generalized weakness   Lower Extremity Assessment Lower Extremity Assessment: Generalized weakness(unable to fully assess LLE 2/2 pain)       Communication Communication Communication: No difficulties  Cognition Arousal/Alertness: Awake/alert Behavior During Therapy: WFL for tasks assessed/performed Overall Cognitive Status: Within Functional Limits for tasks assessed                                     General Comments       Exercises Other Exercises: Pt/family instructed in falls prevention strategies including proper footwear and pet care considerations  Other Exercises:  Pt/family instructed AE for LB ADL tasks to maximize safety and independence  Other Exercises: Pt/family instructed in compression stocking mgt   Shoulder Instructions      Home Living Family/patient expects to be discharged to:: Private residence Living Arrangements: Children;Other relatives(son, DIL, grandaguhter and granddaughter's boyfriend) Available Help at Discharge: Family;Available 24 hours/day Type of Home: Mobile home Home Access: Ramped entrance     Home Layout: One level     Bathroom Shower/Tub: Occupational psychologist: Standard     Home Equipment: Environmental consultant - 2 wheels;Walker - standard;Wheelchair - manual;Cane - single point;Shower seat;Bedside commode   Additional Comments: uses WC for household mobility >5years, performs short distance AMB, transfers independently, holding onto furniture.       Prior Functioning/Environment Level of Independence: Needs assistance  Gait / Transfers Assistance Needed: son assists with WC on ramp and out of house  ADL's / Homemaking Assistance Needed: assist for home making, independent transfer and WC in home; intermittent help with set-up during bathing, pt does some minor driving and DIL does most driving, indep with med mgt            OT Problem List: Decreased strength;Decreased knowledge of use of DME or AE;Decreased range of motion;Pain;Impaired balance (sitting and/or standing);Decreased activity tolerance      OT Treatment/Interventions: Self-care/ADL training;Balance training;Therapeutic exercise;Therapeutic activities;DME and/or AE instruction;Patient/family education    OT Goals(Current goals can be found in the care plan section) Acute Rehab OT Goals Patient Stated Goal: have less pain OT Goal Formulation: With patient/family Time For Goal Achievement: 02/24/18 Potential to Achieve Goals: Good ADL Goals Pt Will Perform Lower Body Dressing: with min assist;with mod assist;sit to/from stand;with adaptive  equipment Pt Will Transfer to Toilet: with mod assist;stand pivot transfer;bedside commode  OT Frequency: Min 1X/week   Barriers to D/C:            Co-evaluation              AM-PAC PT "6 Clicks" Daily Activity     Outcome Measure Help from another person eating meals?: None Help from another person taking care of personal grooming?: None Help from another person toileting, which includes using toliet, bedpan, or urinal?: A Lot Help from another person bathing (including washing, rinsing, drying)?: A Lot Help from another person to put on and taking off regular upper body clothing?: A Little Help from another person to put on and taking off regular lower body clothing?: A Lot 6 Click Score: 17   End of Session    Activity Tolerance: Patient limited by pain Patient left: in bed;with call bell/phone within reach;with bed alarm set;with family/visitor present;with SCD's reapplied  OT Visit Diagnosis: Other abnormalities of gait and mobility (R26.89);Muscle weakness (generalized) (M62.81);Pain Pain - Right/Left: Left Pain - part of body: Hip                Time: 1062-6948 OT Time Calculation (min): 14 min Charges:  OT General Charges $OT Visit: 1 Visit OT Evaluation $  OT Eval Low Complexity: 1 Low OT Treatments $Self Care/Home Management : 8-22 mins  Jeni Salles, MPH, MS, OTR/L ascom [phone] 02/10/18, 3:14 PM

## 2018-02-10 NOTE — Progress Notes (Signed)
Clinical Education officer, museum (CSW) presented bed offers to patient and she chose [facility]. Per [facility] admissions coordinator at [facility] he will start [facility] authorization today.   [employer], LCSW [phone]

## 2018-02-10 NOTE — Clinical Social Work Note (Signed)
Clinical Social Work Assessment  Patient Details  Name: Amber [name] MRN: [id number] Date of Birth: 12-26-1929  Date of referral:  02/10/18               Reason for consult:  Facility Placement                Permission sought to share information with:  Chartered certified accountant granted to share information::  Yes, Verbal Permission Granted  Name::      [place]::   [place]   Relationship::     Contact Information:     Housing/Transportation Living arrangements for the past 2 months:  [place] of Information:  Patient Patient Interpreter Needed:  None Criminal Activity/Legal Involvement Pertinent to Current Situation/Hospitalization:  No - Comment as needed Significant Relationships:  Adult Children Lives with:  Adult Children Do you feel safe going back to the place where you live?  Yes Need for family participation in patient care:  Yes (Comment)  Care giving concerns:  Patient lives with her son Truman Hayward and daughter in law [place] in [place].    Social Worker assessment / plan:  Holiday representative (CSW) received SNF consult. PT is recommending SNF. CSW met with patient alone at bedside to discuss D/C plan. Patient was alert and oriented X4 and was sitting up in the chair. CSW introduced self and explained role of CSW department. Patient reported that she lives in [place] with her son, daughter in law and 2 grandchildren. CSW explained that PT is recommending SNF and that [facility] will have to approve it. Per patient she has been to [facility] a few years ago and would like to go there. FL2 complete and faxed out. CSW will continue to follow and assist as needed.    Employment status:  Retired Nurse, adult PT Recommendations:  [place] / Referral to community resources:  [place]  Patient/Family's Response to care:  Patient is agreeable to  [facility] in [place].   Patient/Family's Understanding of and Emotional Response to Diagnosis, Current Treatment, and Prognosis:  Patient was very pleasant and thanked CSW for assistance.   Emotional Assessment Appearance:  Appears stated age Attitude/Demeanor/Rapport:    Affect (typically observed):  Accepting, Adaptable, Pleasant Orientation:  Oriented to Self, Oriented to Place, Oriented to  Time, Oriented to Situation Alcohol / Substance use:  Not Applicable Psych involvement (Current and /or in the community):  No (Comment)  Discharge Needs  Concerns to be addressed:  Discharge Planning Concerns Readmission within the last 30 days:  No Current discharge risk:  Dependent with Mobility Barriers to Discharge:  Continued Medical Work up   [employer], Veronia Beets, LCSW 02/10/2018, 11:15 AM

## 2018-02-10 NOTE — NC FL2 (Signed)
[place] LEVEL OF CARE SCREENING TOOL     IDENTIFICATION  Patient Name: Amber [name] Birthdate: 1929/10/04 Sex: female Admission Date (Current Location): 02/09/2018  [place] and [place] Number:  Engineering geologist and Address:  [facility], [address] [place], [place] [address]      Provider Number: [id number]  Attending Physician Name and Address:  Hessie Knows, MD  Relative Name and Phone Number:       Current Level of Care: Hospital Recommended Level of Care: [place] Prior Approval Number:    Date Approved/Denied:   PASRR Number: [id number])  Discharge Plan: SNF    Current Diagnoses: Patient Active Problem List   Diagnosis Date Noted  . Status post total hip replacement, left 02/09/2018  . Chronic pain syndrome 04/09/2017  . Other specified health status 04/08/2017  . Disorder of bone, unspecified 04/08/2017  . Long term current use of opiate analgesic 04/08/2017  . Chronic low back pain (Primary Area of Pain) (Bilateral) (L>R) 04/08/2017  . Chronic pain of both lower extremities  (Tertiary Area of Pain) 04/08/2017  . Chronic groin pain (Secondary Area of Pain) 04/08/2017  . Chronic neck pain (Fourth Area of Pain) (Bilateral) (L>R) 04/08/2017  . Chronic sacroiliac joint pain 04/08/2017  . Other long term (current) drug therapy 04/08/2017  . Syncope 02/09/2017  . Asthmatic bronchitis 01/26/2017  . Fall 10/06/2016  . Anxiety 08/08/2016  . TIA (transient ischemic attack) 07/25/2015  . Sick sinus syndrome ([place]) 03/14/2015  . Chronic heart failure ([place]) 01/05/2015  . Fracture dislocation of ankle 07/27/2014  . IDDM (insulin dependent diabetes mellitus) ([place]) 07/27/2014  . Essential hypertension 07/27/2014  . Multiple rib fractures 07/27/2014  . 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    Orientation RESPIRATION BLADDER Height & Weight     Self, Time, Situation, Place  Normal Continent Weight:  73 kg (161 lb) Height:  4\' 11"  (149.9 cm)  BEHAVIORAL SYMPTOMS/MOOD NEUROLOGICAL BOWEL NUTRITION STATUS      Continent Diet(Diet: Carb Modified. )  AMBULATORY STATUS COMMUNICATION OF NEEDS Skin   Extensive Assist Verbally Surgical wounds, Wound Vac(Incision: Left Hip, provena wound vac. )                       Personal Care Assistance Level of Assistance  Bathing, Feeding, Dressing Bathing Assistance: Limited assistance Feeding assistance: Independent Dressing Assistance: Limited assistance     Functional Limitations Info  Sight, Hearing, Speech Sight Info: Adequate Hearing Info: Adequate Speech Info: Adequate    SPECIAL CARE FACTORS FREQUENCY  PT (By licensed PT), OT (By licensed OT)     PT Frequency: (5) OT Frequency: (5)            Contractures      Additional Factors Info  Code Status, Allergies Code Status Info: (Full Code. ) Allergies Info: (Ambien Zolpidem Tartrate, Penicillins, Iodine, Succinylcholine, Etodolac, Nsaids)           Current Medications (02/10/2018):  This is the current hospital active medication list Current Facility-Administered Medications  Medication Dose Route Frequency Provider Last Rate Last Dose  . 0.9 %  sodium chloride infusion   Intravenous Continuous Hessie Knows, MD 75 mL/hr at 02/10/18 0532    . acetaminophen (TYLENOL) tablet 325-650 mg  325-650 mg Oral Q6H PRN Hessie Knows, MD      . acetaminophen (TYLENOL) tablet 500 mg  500 mg Oral Q6H Hessie Knows, MD   500 mg at  02/10/18 0532  . albuterol (PROVENTIL) (2.5 MG/3ML) 0.083% nebulizer solution 2.5 mg  2.5 mg Inhalation Q6H PRN Hessie Knows, MD      . ALPRAZolam Duanne Moron) tablet 0.5 mg  0.5 mg Oral QHS PRN Hessie Knows, MD      . alum & mag hydroxide-simeth (MAALOX/MYLANTA) 200-200-20 MG/5ML suspension 30 mL  30 mL Oral Q4H PRN Hessie Knows, MD      . amLODipine (NORVASC) tablet 5 mg  5 mg Oral Daily Hessie Knows, MD   5 mg at 02/10/18 9678  . apixaban (ELIQUIS) tablet  2.5 mg  2.5 mg Oral BID Hessie Knows, MD   2.5 mg at 02/10/18 9381  . bisacodyl (DULCOLAX) EC tablet 5 mg  5 mg Oral Daily PRN Hessie Knows, MD      . carvedilol (COREG) tablet 6.25 mg  6.25 mg Oral BID WC Hessie Knows, MD   6.25 mg at 02/09/18 1844  . diphenhydrAMINE (BENADRYL) 12.5 MG/5ML elixir 12.5-25 mg  12.5-25 mg Oral Q4H PRN Hessie Knows, MD      . docusate sodium (COLACE) capsule 100 mg  100 mg Oral BID Hessie Knows, MD   100 mg at 02/10/18 0175  . furosemide (LASIX) tablet 20 mg  20 mg Oral Daily Hessie Knows, MD   20 mg at 02/10/18 1025  . gabapentin (NEURONTIN) capsule 200 mg  200 mg Oral TID Hessie Knows, MD   200 mg at 02/10/18 0920  . HYDROcodone-acetaminophen (NORCO) 7.5-325 MG per tablet 1-2 tablet  1-2 tablet Oral Q4H PRN Hessie Knows, MD      . HYDROcodone-acetaminophen (NORCO/VICODIN) 5-325 MG per tablet 1-2 tablet  1-2 tablet Oral Q4H PRN Hessie Knows, MD   1 tablet at 02/10/18 0751  . insulin aspart (novoLOG) injection 20 Units  20 Units Subcutaneous TID Kathreen Cosier, MD   20 Units at 02/10/18 [phone]  . insulin aspart protamine- aspart (NOVOLOG MIX 70/30) injection 15 Units  15 Units Subcutaneous BID WC Hessie Knows, MD      . levETIRAcetam (KEPPRA) tablet 500 mg  500 mg Oral BID Hessie Knows, MD   500 mg at 02/10/18 7824  . magnesium citrate solution 1 Bottle  1 Bottle Oral Once PRN Hessie Knows, MD      . menthol-cetylpyridinium (CEPACOL) lozenge 3 mg  1 lozenge Oral PRN Hessie Knows, MD       Or  . phenol (CHLORASEPTIC) mouth spray 1 spray  1 spray Mouth/Throat PRN Hessie Knows, MD      . methocarbamol (ROBAXIN) tablet 500 mg  500 mg Oral Q6H PRN Hessie Knows, MD   500 mg at 02/10/18 2353   Or  . methocarbamol (ROBAXIN) 500 mg in dextrose 5 % 50 mL IVPB  500 mg Intravenous Q6H PRN Hessie Knows, MD      . metoCLOPramide (REGLAN) tablet 5-10 mg  5-10 mg Oral Q8H PRN Hessie Knows, MD       Or  . metoCLOPramide (REGLAN) injection 5-10 mg  5-10 mg  Intravenous Q8H PRN Hessie Knows, MD      . morphine 2 MG/ML injection 0.5-1 mg  0.5-1 mg Intravenous Q2H PRN Hessie Knows, MD      . multivitamin-lutein (OCUVITE-LUTEIN) capsule 2 capsule  2 capsule Oral Daily Hessie Knows, MD   2 capsule at 02/10/18 6144  . ondansetron (ZOFRAN) tablet 4 mg  4 mg Oral Q6H PRN Hessie Knows, MD       Or  . ondansetron [facility]) injection 4 mg  4 mg Intravenous Q6H PRN Hessie Knows, MD      . pantoprazole (PROTONIX) EC tablet 80 mg  80 mg Oral Daily Hessie Knows, MD   80 mg at 02/10/18 8978  . pravastatin (PRAVACHOL) tablet 40 mg  40 mg Oral QHS Hessie Knows, MD   40 mg at 02/09/18 2156  . sacubitril-valsartan (ENTRESTO) 24-26 mg per tablet  1 tablet Oral BID Hessie Knows, MD   1 tablet at 02/10/18 [phone]  . senna-docusate (Senokot-S) tablet 1 tablet  1 tablet Oral QHS PRN Hessie Knows, MD   1 tablet at 02/10/18 0829  . traMADol (ULTRAM) tablet 50 mg  50 mg Oral Q6H Hessie Knows, MD   50 mg at 02/10/18 0532     Discharge Medications: Please see discharge summary for a list of discharge medications.  Relevant Imaging Results:  Relevant Lab Results:   Additional Information (SSN: [id number])  Amber [name], Veronia Beets, LCSW

## 2018-02-11 LAB — GLUCOSE, CAPILLARY
GLUCOSE-CAPILLARY: 155 mg/dL — AB (ref 70–99)
Glucose-Capillary: 179 mg/dL — ABNORMAL HIGH (ref 70–99)
Glucose-Capillary: 155 mg/dL — ABNORMAL HIGH (ref 70–99)
Glucose-Capillary: 230 mg/dL — ABNORMAL HIGH (ref 70–99)

## 2018-02-11 LAB — BASIC METABOLIC PANEL
ANION GAP: 10 (ref 5–15)
BUN: 25 mg/dL — AB (ref 8–23)
CALCIUM: 8.7 mg/dL — AB (ref 8.9–10.3)
CO2: 26 mmol/L (ref 22–32)
Chloride: 97 mmol/L — ABNORMAL LOW (ref 98–111)
Creatinine, Ser: 1.04 mg/dL — ABNORMAL HIGH (ref 0.44–1.00)
GFR calc Af Amer: 55 mL/min — ABNORMAL LOW (ref >60)
GFR, EST NON AFRICAN AMERICAN: 48 mL/min — AB (ref >60)
GLUCOSE: 207 mg/dL — AB (ref 70–99)
Potassium: 4.1 mmol/L (ref 3.5–5.1)
Sodium: 133 mmol/L — ABNORMAL LOW (ref 135–145)

## 2018-02-11 LAB — SURGICAL PATHOLOGY

## 2018-02-11 LAB — CBC
HCT: 31.5 % — ABNORMAL LOW (ref 35.0–47.0)
Hemoglobin: 10.6 g/dL — ABNORMAL LOW (ref 12.0–16.0)
MCH: 29.9 pg (ref 26.0–34.0)
MCHC: 33.6 g/dL (ref 32.0–36.0)
MCV: 89.2 fL (ref 80.0–100.0)
PLATELETS: 177 10*3/uL (ref 150–440)
RBC: 3.54 MIL/uL — ABNORMAL LOW (ref 3.80–5.20)
RDW: 16.8 % — AB (ref 11.5–14.5)
WBC: 10.6 10*3/uL (ref 3.6–11.0)

## 2018-02-11 MED ORDER — INSULIN ASPART 100 UNIT/ML ~~LOC~~ SOLN
0.0000 [IU] | Freq: Every day | SUBCUTANEOUS | Status: DC
  Administered 2018-02-11: 2 [IU] via SUBCUTANEOUS
  Filled 2018-02-11: qty 1

## 2018-02-11 MED ORDER — INSULIN ASPART 100 UNIT/ML ~~LOC~~ SOLN
0.0000 [IU] | Freq: Three times a day (TID) | SUBCUTANEOUS | Status: DC
  Administered 2018-02-11 – 2018-02-12 (×2): 3 [IU] via SUBCUTANEOUS
  Administered 2018-02-12: 5 [IU] via SUBCUTANEOUS
  Filled 2018-02-11 (×3): qty 1

## 2018-02-11 MED ORDER — HYDROCODONE-ACETAMINOPHEN 5-325 MG PO TABS: 1.0000 | ORAL_TABLET | ORAL | 0 refills | Status: DC | PRN

## 2018-02-11 MED ORDER — TRAMADOL HCL 50 MG PO TABS: 50.0000 mg | ORAL_TABLET | Freq: Four times a day (QID) | ORAL | 1 refills | Status: DC | PRN

## 2018-02-11 MED ORDER — INSULIN ASPART 100 UNIT/ML ~~LOC~~ SOLN: 0.0000 [IU] | Freq: Three times a day (TID) | SUBCUTANEOUS | Status: DC

## 2018-02-11 NOTE — Progress Notes (Addendum)
Inpatient Diabetes Program Recommendations  AACE/ADA: New Consensus Statement on Inpatient Glycemic Control (2019)  Target Ranges:  Prepandial:   less than 140 mg/dL      Peak postprandial:   less than 180 mg/dL (1-2 hours)      Critically ill patients:  140 - 180 mg/dL   Results for Amber [name], Amber [name] (MRN [id number]) as of 02/11/2018 09:37  Ref. Range 02/10/2018 08:21 02/10/2018 11:53 02/10/2018 16:52 02/10/2018 21:45 02/11/2018 07:55  Glucose-Capillary Latest Ref Range: 70 - 99 mg/dL 209 (H)  Novolog 20 units 102 (H) 137 (H) 259 (H) 179 (H)  Novolog 20 units   Review of Glycemic Control  Diabetes history: DM2 Outpatient Diabetes medications: Novolog 70/30 15 units QAM if glucose over 200 mg/dl, Regular 10 units QAM with breakfast if glucose over 100 mg/dl Current orders for Inpatient glycemic control: 70/30 15 units BID, Novolog 20 units TID with meals  Inpatient Diabetes Program Recommendations:  Insulin - Basal: Noted patient refused 70/30 insulin both times on 02/10/18 as well as this morning and per home medication list patient is NOT TAKING 70/30 at home. Please consider discontinuing 70/30. If glucose is consistently greater than 180 mg/dl with Novolog correction, would recommend using low dose Levemir (such as 5-7 units Q24H). Correction (SSI): Please discontinue Novolog 20 units TID with meals. Please consider using Glycemic Control order set to order CBGs with Novolog 0-15 units TID with meals and Novolog 0-5 units QHS.  Addendum 02/11/18@11 :45-Spoke with patient about diabetes and home regimen for diabetes control. Patient reports that she is followed by PCP for diabetes management and currently she is prescribed Novolog 70/30 and Regular insulin to take for DM control. Patient states that she checks her glucose in the morning and if it is less than 100 mg/dl she does not take any insulin. If it is 100-199 mg/dl, she only takes Regular 10 units with breakfast. If glucose is over 200 mg/dl  then she takes the Regular 10 units and 70/30 15 units with breakfast. Patient does not take the Regular or 70/30 any other time but in the mornings unless her glucose is running over 300 mg/dl (she she reports has only happened once in the past 6 months.  Patient states that her glucose averages about 150 mg/dl most of the time so she does not have to take the 70/30 very often. Patient notes that she has been out of the 70/30 for a few days and she does not have much Regular insulin left at home. Patient states she will need a prescription for the 70/30 and the Regular insulin at time of discharge.  In talking with patient, she has several social stress factors (limited income, has to get food from food pantries, and has several family members that live with her). Patient admits that at times she has to go without things if she does not have the money to purchase them. Patient states that she has Medicare and Medicaid and her medications do not cost her very much. Patient states that her co-pays to see a specialist is $45 per visit which is sometime an issue because she can not always afford it. Patient states that she has an appointment with a diabetes clinic in [place] next month so they can help her with DM management. Encouraged patient to continue taking insulin and checking glucose consistently at home and to keep a good record of what she actually takes so she can take the information to her appointment at the diabetes clinic.  Patient verbalized understanding of information discussed and she states that she has no further questions at this time related to diabetes. Talked with Susana, RN regarding patient report of insulin she is actually taking at home. Asked that MD be paged to inquire about changing orders as recommended.  Thanks, Barnie Alderman, RN, MSN, CDE Diabetes Coordinator Inpatient Diabetes Program [phone] (Team Pager from 8am to 5pm)

## 2018-02-11 NOTE — Progress Notes (Signed)
   Subjective: 2 Days Post-Op Procedure(s) (LRB): TOTAL HIP ARTHROPLASTY ANTERIOR APPROACH (Left) Patient reports pain as moderate.   Patient is well, and has had no acute complaints or problems Denies any CP, SOB, ABD pain. We will continue therapy today.   Objective: Vital signs in last 24 hours: Temp:  [97.7 F (36.5 C)-98.7 F (37.1 C)] 98.4 F (36.9 C) (08/08 0307) Pulse Rate:  [51-63] 63 (08/08 0307) Resp:  [16-18] 18 (08/08 0307) BP: (106-126)/(44-91) 119/66 (08/08 0307) SpO2:  [80 %-96 %] 96 % (08/08 0307)  Intake/Output from previous day: 08/07 0701 - 08/08 0700 In: 960 [P.O.:960] Out: 640 [Urine:600; Drains:40] Intake/Output this shift: No intake/output data recorded.  Recent Labs    02/08/18 1530 02/10/18 0552 02/11/18 0530  HGB 13.9 11.7* 10.6*   Recent Labs    02/10/18 0552 02/11/18 0530  WBC 8.3 10.6  RBC 3.90 3.54*  HCT 34.9* 31.5*  PLT 182 177   Recent Labs    02/10/18 0552 02/11/18 0530  NA 138 133*  K 4.9 4.1  CL 101 97*  CO2 29 26  BUN 24* 25*  CREATININE 1.07* 1.04*  GLUCOSE 204* 207*  CALCIUM 8.8* 8.7*   Recent Labs    02/08/18 1530  INR 0.97    EXAM General - Patient is Alert, Appropriate and Oriented Extremity - Neurovascular intact Sensation intact distally Intact pulses distally Incision: dressing C/D/I and no drainage No cellulitis present Compartment soft Dressing - dressing C/D/I, wound VAC intact.  Hemovac removed by patient in the p.m. Motor Function - intact, moving foot and toes well on exam.  Ambulated small amounts yesterday  Past Medical History:  Diagnosis Date  . Anemia   . Anxiety   . Arthritis   . Benign neoplasm of colon   . CHF (congestive heart failure) ([place])   . Chronic kidney disease   . Chronic pain   . Complication of anesthesia 1980's   hard time waking up  . Coronary artery disease with unspecified angina pectoris   . Cough   . Diabetes mellitus without complication ([place])   .  Essential hypertension   . High risk medication use   . Hyperlipidemia   . Plantar fascial fibromatosis   . Presence of permanent cardiac pacemaker   . Shortness of breath dyspnea     Assessment/Plan:   2 Days Post-Op Procedure(s) (LRB): TOTAL HIP ARTHROPLASTY ANTERIOR APPROACH (Left) Active Problems:   Status post total hip replacement, left  Estimated body mass index is 32.52 kg/m as calculated from the following:   Height as of this encounter: 4\' 11"  (1.499 m).   Weight as of this encounter: 73 kg. Advance diet Up with therapy  Needs bowel movement  Care management to assist with discharge to skilled nursing on Friday   DVT Prophylaxis - TED hose and Eliquis, SCDs Weight-Bearing as tolerated to left leg   Reche Dixon, PA-C [place] 02/11/2018, 7:07 AM

## 2018-02-11 NOTE — Progress Notes (Signed)
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 Hessie Knows. PMH: GAD, anemia, OA, CAD, DM, HTN, HLD, PPM, Ankle Fracture, drop out episodes per patient now on antiepileptic meds.     PT Comments    Less pain with mobility this date, better mobility, but activation remains limited. Lots of LLE guarding with HEP. Transfers still labored and require heavy physical assist. Bed mobility remains appropriate for +2 assistance. Pt remains motivated and determined to do what she is able to do.    Follow Up Recommendations  SNF;Supervision for mobility/OOB     Equipment Recommendations  None recommended by PT    Recommendations for Other Services       Precautions / Restrictions Precautions Precautions: Fall;None Restrictions LLE Weight Bearing: Weight bearing as tolerated    Mobility  Bed Mobility Overal bed mobility: Needs Assistance Bed Mobility: Sit to Supine;Supine to Sit     Supine to sit: Total assist Sit to supine: Total assist;+2 for physical assistance   General bed mobility comments: slightly improved capacity to use BUE for coming up, but technique for return to supine incites dramatic pain increase immobilizing pt midway  Transfers Overall transfer level: Needs assistance Equipment used: Rolling walker (2 wheeled) Transfers: Sit to/from [facility] Sit to Stand: Mod assist(attempted several times, but ultiamtely still needs heavy assist to rise; much better from [facility]. ) Stand pivot transfers: Min assist(pivot steps and turing with RW more pain limited today, but overall moving much better.)          Ambulation/Gait Ambulation/Gait assistance: (deferred d/t poor standing toleranc.e )               Stairs             Wheelchair Mobility    Modified Rankin (Stroke Patients Only)       Balance Overall balance assessment: Mild deficits observed, not formally tested                                          Cognition Arousal/Alertness: Awake/alert Behavior During Therapy: WFL for tasks assessed/performed Overall Cognitive Status: Within Functional Limits for tasks assessed                                        Exercises Total Joint Exercises Ankle Circles/Pumps: Limitations Ankle Circles/Pumps Limitations: performing prior to treatment  Short Arc Quad: AAROM;Left;10 reps;Seated Heel Slides: AAROM;Left;Supine;15 reps(very limited by guarding in pain, difficulty over 65 degree hip flexion) Hip ABduction/ADduction: AAROM;Supine;Left;15 reps Long Arc Quad: AROM;Right;10 reps;Seated Other Exercises Other Exercises: Seated heel raises with gravity resistance: 15x bilat (full range)     General Comments        Pertinent Vitals/Pain Pain Assessment: No/denies pain Faces Pain Scale: Hurts a little bit Pain Descriptors / Indicators: Aching;Sore;Operative site guarding;Grimacing Pain Intervention(s): Limited activity within patient's tolerance;Monitored during session;Premedicated before session;Repositioned    Home Living                      Prior Function  PT Goals (current goals can now be found in the care plan section) Acute Rehab PT Goals Patient Stated Goal: have less pain PT Goal Formulation: With patient Time For Goal Achievement: 02/24/18 Potential to Achieve Goals: Good Progress towards PT goals: Progressing toward goals    Frequency    BID      PT Plan Current plan remains appropriate    Co-evaluation              AM-PAC PT "6 Clicks" Daily Activity  Outcome Measure  Difficulty turning over in bed (including adjusting bedclothes, sheets and blankets)?: Unable Difficulty moving from lying  on back to sitting on the side of the bed? : Unable Difficulty sitting down on and standing up from a chair with arms (e.g., wheelchair, bedside commode, etc,.)?: Unable Help needed moving to and from a bed to chair (including a wheelchair)?: A Lot Help needed walking in hospital room?: Total Help needed climbing 3-5 steps with a railing? : Total 6 Click Score: 7    End of Session   Activity Tolerance: Patient tolerated treatment well;Patient limited by fatigue;Patient limited by pain Patient left: in bed;with family/visitor present;with call bell/phone within reach;with SCD's reapplied(chair'd out) Nurse Communication: Mobility status PT Visit Diagnosis: Difficulty in walking, not elsewhere classified (R26.2);Muscle weakness (generalized) (M62.81);Dizziness and giddiness (R42);Other abnormalities of gait and mobility (R26.89)     Time: 7412-8786 PT Time Calculation (min) (ACUTE ONLY): 28 min  Charges:  $Therapeutic Exercise: 8-22 mins $Therapeutic Activity: 8-22 mins                     10:07 AM, 02/11/18 Etta Grandchild, PT, DPT Physical Therapist - [facility]  [phone] ([place])     [place] C 02/11/2018, 10:05 AM

## 2018-02-11 NOTE — Progress Notes (Signed)
MD Rudene Christians made aware regarding Diabetes coordinator's recommendations. New orders placed. See [facility]

## 2018-02-11 NOTE — Plan of Care (Signed)
  Problem: Pain Managment: Goal: General experience of comfort will improve Outcome: Progressing   

## 2018-02-11 NOTE — Plan of Care (Signed)
  Problem: Education: Goal: Knowledge of General Education information will improve Description Including pain rating scale, medication(s)/side effects and non-pharmacologic comfort measures Outcome: Progressing   Problem: Health Behavior/Discharge Planning: Goal: Ability to manage health-related needs will improve Outcome: Progressing   Problem: Clinical Measurements: Goal: Ability to maintain clinical measurements within normal limits will improve Outcome: Progressing Goal: Will remain free from infection Outcome: Progressing Goal: Diagnostic test results will improve Outcome: Progressing Goal: Respiratory complications will improve Outcome: Progressing Goal: Cardiovascular complication will be avoided Outcome: Progressing   Problem: Activity: Goal: Risk for activity intolerance will decrease Outcome: Progressing   Problem: Nutrition: Goal: Adequate nutrition will be maintained Outcome: Progressing   Problem: Coping: Goal: Level of anxiety will decrease Outcome: Progressing   Problem: Elimination: Goal: Will not experience complications related to bowel motility Outcome: Progressing Goal: Will not experience complications related to urinary retention Outcome: Progressing   Problem: Pain Managment: Goal: General experience of comfort will improve Outcome: Progressing   Problem: Safety: Goal: Ability to remain free from injury will improve Outcome: Progressing   Problem: Education: Goal: Knowledge of the prescribed therapeutic regimen will improve Outcome: Progressing Goal: Understanding of discharge needs will improve Outcome: Progressing Goal: Individualized Educational Video(s) Outcome: Progressing

## 2018-02-11 NOTE — Progress Notes (Signed)
Clinical Education officer, museum (CSW) contacted [employer] to check on SNF authorization status. Per [facility] authorization is still pending.   [employer], LCSW [phone]

## 2018-02-11 NOTE — Progress Notes (Signed)
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 Hessie Knows. PMH: GAD, anemia, OA, CAD, DM, HTN, HLD, PPM, Ankle Fracture, drop out episodes per patient now on antiepileptic meds.     PT Comments    PT returning for second of 2 visits this date. Pt in bed, supine, drowsy appearing, becomes only mildly more alert in session. 1L/min O2 donned with Rattan, SpO2 low 90s, 89% on room air at end of session, O2 donned prior to exit. Pt continues to require heavy physical assist for bed mobility, motivated to transfer to [facility], then to chair. Pt supporting self on RW better than yesterday, but still having weakness in BLE with intermittent buckling. Pt progressing slowly, but still having difficulty activating LLE muscle groups. Burning trochanteric pain quite limiting in weightbearing.  Pt reports she cleared some phlegm this morning similar to when she needs to use her COPD inhaler at home. Supplemental O2 remains requisite for O2 sats> 89%.     Follow Up Recommendations  SNF;Supervision for mobility/OOB     Equipment Recommendations  None recommended by PT    Recommendations for Other Services       Precautions / Restrictions Precautions Precautions: Fall;None Restrictions LLE Weight Bearing: Weight bearing as tolerated    Mobility  Bed Mobility Overal bed mobility: Needs Assistance Bed Mobility: Supine to Sit     Supine to sit: Total assist;+2 for safety/equipment;+2 for physical assistance Sit to supine: Total assist;+2 for physical assistance   General bed mobility comments: Tech assists with trunk support and rotation as PT assist with LUE hand hold and LLE lift assist  Transfers Overall transfer level: Needs assistance Equipment used: Rolling  walker (2 wheeled) Transfers: Stand Pivot Transfers;Sit to/from Stand Sit to Stand: Mod assist(lift assist from elevated surface, additional time ~15seconds to establish mod-I balance with RW. ) Stand pivot transfers: Max assist(1 eopisode of buckling for each transfers, difficulty maintaining upright, needs help progressing LLE, unable to lift from floor. )          Ambulation/Gait Ambulation/Gait assistance: (deferred d/t poor standing toleranc.e )               Stairs             Wheelchair Mobility    Modified Rankin (Stroke Patients Only)       Balance Overall balance assessment: Needs assistance Sitting-balance support: Single extremity supported;Feet supported Sitting balance-Leahy Scale: Fair Sitting balance - Comments: Leaning trunk to the Right for pain control   Standing balance support: Bilateral upper extremity supported;During functional activity Standing balance-Leahy Scale: Poor Standing balance comment: collapses several times, difficulty progressing LLE                             Cognition Arousal/Alertness: (drowsy ) Behavior During Therapy: WFL for tasks assessed/performed Overall Cognitive Status: Within Functional Limits for tasks assessed                                        Exercises Total Joint Exercises Ankle Circles/Pumps: Limitations Ankle Circles/Pumps Limitations: performing prior to treatment  Short Arc Quad: AAROM;Left;10 reps;Seated  Heel Slides: AAROM;Left;Supine;15 reps(very limited by guarding in pain, difficulty over 65 degree hip flexion) Hip ABduction/ADduction: AAROM;Supine;Left;15 reps Long Arc Quad: AROM;10 reps;Seated;Both;AAROM;Limitations Long Arc Quad Limitations: activation very limited at this time unable to perform to TKE.  Other Exercises Other Exercises: Seated heel raises with gravity resistance: 15x bilat (full range)     General Comments        Pertinent Vitals/Pain  Pain Assessment: 0-10 Pain Score: 8  Faces Pain Scale: Hurts a little bit Pain Location: 8/10 pain in L when in stance.  Pain Descriptors / Indicators: Burning("like a fire" ) Pain Intervention(s): Limited activity within patient's tolerance;Monitored during session;Premedicated before session    Home Living                      Prior Function            PT Goals (current goals can now be found in the care plan section) Acute Rehab PT Goals Patient Stated Goal: have less pain PT Goal Formulation: With patient Time For Goal Achievement: 02/24/18 Potential to Achieve Goals: Good Progress towards PT goals: Progressing toward goals    Frequency    BID      PT Plan Current plan remains appropriate    Co-evaluation              AM-PAC PT "6 Clicks" Daily Activity  Outcome Measure  Difficulty turning over in bed (including adjusting bedclothes, sheets and blankets)?: Unable Difficulty moving from lying on back to sitting on the side of the bed? : Unable Difficulty sitting down on and standing up from a chair with arms (e.g., wheelchair, bedside commode, etc,.)?: Unable Help needed moving to and from a bed to chair (including a wheelchair)?: A Lot Help needed walking in hospital room?: Total Help needed climbing 3-5 steps with a railing? : Total 6 Click Score: 7    End of Session Equipment Utilized During Treatment: Oxygen Activity Tolerance: Patient tolerated treatment well;Patient limited by fatigue;Patient limited by pain Patient left: with call bell/phone within reach;with SCD's reapplied;in chair;with chair alarm set Nurse Communication: Mobility status PT Visit Diagnosis: Difficulty in walking, not elsewhere classified (R26.2);Muscle weakness (generalized) (M62.81);Dizziness and giddiness (R42);Other abnormalities of gait and mobility (R26.89)     Time: 7416-3845 PT Time Calculation (min) (ACUTE ONLY): 23 min  Charges:  $Therapeutic Exercise: 8-22  mins $Therapeutic Activity: 23-37 mins                     2:00 PM, 02/11/18 Etta Grandchild, PT, DPT Physical Therapist - [facility]  [phone] ([place])     Crook C 02/11/2018, 1:55 PM

## 2018-02-11 NOTE — Discharge Instructions (Signed)
ANTERIOR APPROACH TOTAL HIP REPLACEMENT POSTOPERATIVE DIRECTIONS   Hip Rehabilitation, Guidelines Following Surgery  The results of a hip operation are greatly improved after range of motion and muscle strengthening exercises. Follow all safety measures which are given to protect your hip. If any of these exercises cause increased pain or swelling in your joint, decrease the amount until you are comfortable again. Then slowly increase the exercises. Call your caregiver if you have problems or questions.   HOME CARE INSTRUCTIONS  Remove items at home which could result in a fall. This includes throw rugs or furniture in walking pathways.   ICE to the affected hip every three hours for 30 minutes at a time and then as needed for pain and swelling.  Continue to use ice on the hip for pain and swelling from surgery. You may notice swelling that will progress down to the foot and ankle.  This is normal after surgery.  Elevate the leg when you are not up walking on it.    Continue to use the breathing machine which will help keep your temperature down.  It is common for your temperature to cycle up and down following surgery, especially at night when you are not up moving around and exerting yourself.  The breathing machine keeps your lungs expanded and your temperature down.  Do not place pillow under knee, focus on keeping the knee straight while resting  DIET You may resume your previous home diet once your are discharged from the hospital.  DRESSING / WOUND CARE / SHOWERING Keep the surgical dressing until follow up.  The dressing is water proof, so you can shower without any extra covering.  IF THE DRESSING FALLS OFF or the wound gets wet inside, change the dressing with sterile gauze.  Please use good hand washing techniques before changing the dressing.  Do not use any lotions or creams on the incision until instructed by your surgeon.    The wound VAC will remain in place until 1 week from  surgery.  It can be removed and a surgical bandage can be applied.  Staples will be removed at 2 weeks. Keep your dressing dry with showering.  You can keep it covered and pat dry. Change the surgical dressing daily and reapply a dry dressing each time.  ACTIVITY Walk with your walker as instructed. Use walker as long as suggested by your caregivers. Avoid periods of inactivity such as sitting longer than an hour when not asleep. This helps prevent blood clots.  You may resume a sexual relationship in one month or when given the OK by your doctor.  You may return to work once you are cleared by your doctor.  Do not drive a car for 6 weeks or until released by you surgeon.  Do not drive while taking narcotics.  WEIGHT BEARING Weight bearing as tolerated with assist device (walker, cane, etc) as directed, use it as long as suggested by your surgeon or therapist, typically at least 4-6 weeks.  POSTOPERATIVE CONSTIPATION PROTOCOL Constipation - defined medically as fewer than three stools per week and severe constipation as less than one stool per week.  One of the most common issues patients have following surgery is constipation.  Even if you have a regular bowel pattern at home, your normal regimen is likely to be disrupted due to multiple reasons following surgery.  Combination of anesthesia, postoperative narcotics, change in appetite and fluid intake all can affect your bowels.  In order to avoid complications  following surgery, here are some recommendations in order to help you during your recovery period.  Colace (docusate) - Pick up an over-the-counter form of Colace or another stool softener and take twice a day as long as you are requiring postoperative pain medications.  Take with a full glass of water daily.  If you experience loose stools or diarrhea, hold the colace until you stool forms back up.  If your symptoms do not get better within 1 week or if they get worse, check with your  doctor.  Dulcolax (bisacodyl) - Pick up over-the-counter and take as directed by the product packaging as needed to assist with the movement of your bowels.  Take with a full glass of water.  Use this product as needed if not relieved by Colace only.   MiraLax (polyethylene glycol) - Pick up over-the-counter to have on hand.  MiraLax is a solution that will increase the amount of water in your bowels to assist with bowel movements.  Take as directed and can mix with a glass of water, juice, soda, coffee, or tea.  Take if you go more than two days without a movement. Do not use MiraLax more than once per day. Call your doctor if you are still constipated or irregular after using this medication for 7 days in a row.  If you continue to have problems with postoperative constipation, please contact the office for further assistance and recommendations.  If you experience "the worst abdominal pain ever" or develop nausea or vomiting, please contact the office immediatly for further recommendations for treatment.  ITCHING  If you experience itching with your medications, try taking only a single pain pill, or even half a pain pill at a time.  You can also use Benadryl over the counter for itching or also to help with sleep.   TED HOSE STOCKINGS Wear the elastic stockings on both legs for three weeks following surgery during the day but you may remove then at night for sleeping.  MEDICATIONS See your medication summary on the After Visit Summary that the nursing staff will review with you prior to discharge.  You may have some home medications which will be placed on hold until you complete the course of blood thinner medication.  It is important for you to complete the blood thinner medication as prescribed by your surgeon.  Continue your approved medications as instructed at time of discharge.  PRECAUTIONS If you experience chest pain or shortness of breath - call 911 immediately for transfer to the  hospital emergency department.  If you develop a fever greater that 101 F, purulent drainage from wound, increased redness or drainage from wound, foul odor from the wound/dressing, or calf pain - CONTACT YOUR SURGEON.                                                   FOLLOW-UP APPOINTMENTS Make sure you keep all of your appointments after your operation with your surgeon and caregivers. You should call the office at the above phone number and make an appointment for approximately two weeks after the date of your surgery or on the date instructed by your surgeon outlined in the "After Visit Summary".  RANGE OF MOTION AND STRENGTHENING EXERCISES  These exercises are designed to help you keep full movement of your hip joint. Follow your caregiver's  or physical therapist's instructions. Perform all exercises about fifteen times, three times per day or as directed. Exercise both hips, even if you have had only one joint replacement. These exercises can be done on a training (exercise) mat, on the floor, on a table or on a bed. Use whatever works the best and is most comfortable for you. Use music or television while you are exercising so that the exercises are a pleasant break in your day. This will make your life better with the exercises acting as a break in routine you can look forward to.  Lying on your back, slowly slide your foot toward your buttocks, raising your knee up off the floor. Then slowly slide your foot back down until your leg is straight again.  Lying on your back spread your legs as far apart as you can without causing discomfort.  Lying on your side, raise your upper leg and foot straight up from the floor as far as is comfortable. Slowly lower the leg and repeat.  Lying on your back, tighten up the muscle in the front of your thigh (quadriceps muscles). You can do this by keeping your leg straight and trying to raise your heel off the floor. This helps strengthen the largest muscle  supporting your knee.  Lying on your back, tighten up the muscles of your buttocks both with the legs straight and with the knee bent at a comfortable angle while keeping your heel on the floor.   IF YOU ARE TRANSFERRED TO A SKILLED REHAB FACILITY If the patient is transferred to a skilled rehab facility following release from the hospital, a list of the current medications will be sent to the facility for the patient to continue.  When discharged from the skilled rehab facility, please have the facility set up the patient's Evaro prior to being released. Also, the skilled facility will be responsible for providing the patient with their medications at time of release from the facility to include their pain medication, the muscle relaxants, and their blood thinner medication. If the patient is still at the rehab facility at time of the two week follow up appointment, the skilled rehab facility will also need to assist the patient in arranging follow up appointment in our office and any transportation needs.  MAKE SURE YOU:  Understand these instructions.  Get help right away if you are not doing well or get worse.    Pick up stool softner and laxative for home use following surgery while on pain medications. Do not submerge incision under water. Please use good hand washing techniques while changing dressing each day. May shower starting three days after surgery. Please use a clean towel to pat the incision dry following showers. Continue to use ice for pain and swelling after surgery. Do not use any lotions or creams on the incision until instructed by your surgeon.

## 2018-02-11 NOTE — Progress Notes (Signed)
Patient appears to be confused. Pulled out IV and hemovac.  spo2 in 80's on room air.  Placed on 2L 02

## 2018-02-12 DIAGNOSIS — M25559 Pain in unspecified hip: Secondary | ICD-10-CM | POA: Diagnosis not present

## 2018-02-12 DIAGNOSIS — M810 Age-related osteoporosis without current pathological fracture: Secondary | ICD-10-CM | POA: Diagnosis not present

## 2018-02-12 DIAGNOSIS — R310 Gross hematuria: Secondary | ICD-10-CM | POA: Diagnosis not present

## 2018-02-12 DIAGNOSIS — I495 Sick sinus syndrome: Secondary | ICD-10-CM | POA: Diagnosis not present

## 2018-02-12 DIAGNOSIS — I4891 Unspecified atrial fibrillation: Secondary | ICD-10-CM | POA: Diagnosis not present

## 2018-02-12 DIAGNOSIS — M6281 Muscle weakness (generalized): Secondary | ICD-10-CM | POA: Diagnosis not present

## 2018-02-12 DIAGNOSIS — I10 Essential (primary) hypertension: Secondary | ICD-10-CM | POA: Diagnosis not present

## 2018-02-12 DIAGNOSIS — Z96642 Presence of left artificial hip joint: Secondary | ICD-10-CM | POA: Diagnosis not present

## 2018-02-12 DIAGNOSIS — J45909 Unspecified asthma, uncomplicated: Secondary | ICD-10-CM | POA: Diagnosis not present

## 2018-02-12 DIAGNOSIS — R262 Difficulty in walking, not elsewhere classified: Secondary | ICD-10-CM | POA: Diagnosis not present

## 2018-02-12 DIAGNOSIS — Z950 Presence of cardiac pacemaker: Secondary | ICD-10-CM | POA: Diagnosis not present

## 2018-02-12 DIAGNOSIS — Z7401 Bed confinement status: Secondary | ICD-10-CM | POA: Diagnosis not present

## 2018-02-12 DIAGNOSIS — M1612 Unilateral primary osteoarthritis, left hip: Secondary | ICD-10-CM | POA: Diagnosis not present

## 2018-02-12 LAB — CBC
HCT: 30.9 % — ABNORMAL LOW (ref 35.0–47.0)
Hemoglobin: 10.9 g/dL — ABNORMAL LOW (ref 12.0–16.0)
MCH: 31.2 pg (ref 26.0–34.0)
MCHC: 35.2 g/dL (ref 32.0–36.0)
MCV: 88.6 fL (ref 80.0–100.0)
PLATELETS: 177 10*3/uL (ref 150–440)
RBC: 3.49 MIL/uL — ABNORMAL LOW (ref 3.80–5.20)
RDW: 16.1 % — ABNORMAL HIGH (ref 11.5–14.5)
WBC: 10.5 10*3/uL (ref 3.6–11.0)

## 2018-02-12 LAB — GLUCOSE, CAPILLARY
GLUCOSE-CAPILLARY: 158 mg/dL — AB (ref 70–99)
GLUCOSE-CAPILLARY: 249 mg/dL — AB (ref 70–99)

## 2018-02-12 NOTE — Discharge Summary (Signed)
Physician Discharge Summary  Subjective: 3 Days Post-Op Procedure(s) (LRB): TOTAL HIP ARTHROPLASTY ANTERIOR APPROACH (Left) Patient reports pain as moderate.   Patient seen in rounds with Dr. Rudene Christians. Patient is well, and has had no acute complaints or problems Patient is ready to go to skilled nursing for rehab  Physician Discharge Summary  Patient ID: Amber [name] MRN: [id number] DOB/AGE: 07-27-1929 84 y.o.  Admit date: 02/09/2018 Discharge date: 02/12/2018  Admission Diagnoses:  Discharge Diagnoses:  Active Problems:   Status post total hip replacement, left   Discharged Condition: poor  Hospital Course: The patient is postop day 3 from an anterior approach left total hip replacement.  She has done very little physical therapy and ambulation.  Her pain has been slowly controlled.  Her vitals have remained stable.  She has not had a bowel movement yet.  She will need a bowel movement before discharge.  Treatments: surgery:  TOTAL HIP ARTHROPLASTY ANTERIOR APPROACH (Left)  SURGEON: Laurene Footman, MD  ASSISTANTS: None  ANESTHESIA:   general  EBL:  No intake/output data recorded.  BLOOD ADMINISTERED:none  DRAINS: (2) Hemovact drain(s) in the And the one in the subcutaneous layer 1 in the joint with  Suction Open   LOCAL MEDICATIONS USED:  MARCAINE     SPECIMEN:  Source of Specimen:  Left femoral head  DISPOSITION OF SPECIMEN:  PATHOLOGY  COUNTS:  YES  TOURNIQUET:  * No tourniquets in log *  IMPLANTS: Medact AMIS 1 standard with L 28 mm metal head, 48 mm Mpact DM cup and liner  Discharge Exam: Blood pressure (!) 103/51, pulse 67, temperature 99.9 F (37.7 C), temperature source Axillary, resp. rate 16, height 4\' 11"  (1.499 m), weight 73 kg, SpO2 95 %.   Disposition: Discharge disposition: 03-Skilled Nursing Facility        Allergies as of 02/12/2018      Reactions   Ambien [zolpidem Tartrate] Other (See Comments)   Reaction:  Keeps pt  awake    Penicillins Itching, Other (See Comments)   Has patient had a PCN reaction causing immediate rash, facial/tongue/throat swelling, SOB or lightheadedness with hypotension: No Has patient had a PCN reaction causing severe rash involving mucus membranes or skin necrosis: No Has patient had a PCN reaction that required hospitalization No Has patient had a PCN reaction occurring within the last 10 years: No If all of the above answers are "NO", then may proceed with Cephalosporin use.   Iodine Itching   Succinylcholine Other (See Comments)   Reaction:  Unknown    Etodolac Other (See Comments)   GI upset   Nsaids Other (See Comments)   Reaction: gi upset      Medication List    TAKE these medications   acetaminophen 500 MG tablet Commonly known as:  TYLENOL Take 1,000 mg by mouth every 6 (six) hours as needed.   albuterol 108 (90 Base) MCG/ACT inhaler Commonly known as:  PROVENTIL HFA;VENTOLIN HFA Inhale 2 puffs into the lungs every 6 (six) hours as needed for wheezing or shortness of breath.   alendronate 70 MG tablet Commonly known as:  FOSAMAX Take 70 mg by mouth every Thursday.   ALPRAZolam 1 MG tablet Commonly known as:  XANAX Take 1 tablet (1 mg total) by mouth at bedtime as needed for anxiety. What changed:  how much to take   amLODipine 10 MG tablet Commonly known as:  NORVASC Take 5 mg by mouth daily.   calcium carbonate 1500 (600 Ca)  MG Tabs tablet Commonly known as:  OSCAL Take 600 mg of elemental calcium by mouth daily with breakfast.   carvedilol 6.25 MG tablet Commonly known as:  COREG Take 1 tablet (6.25 mg total) by mouth 2 (two) times daily with a meal.   ELIQUIS 5 MG Tabs tablet Generic drug:  apixaban Take 5 mg by mouth 2 (two) times daily.   furosemide 20 MG tablet Commonly known as:  LASIX TAKE ONE TABLET BY MOUTH ONCE DAILY   gabapentin 100 MG capsule Commonly known as:  NEURONTIN Take 200 mg by mouth 3 (three) times daily.    HYDROcodone-acetaminophen 5-325 MG tablet Commonly known as:  NORCO/VICODIN Take 1-2 tablets by mouth every 4 (four) hours as needed for moderate pain (pain score 4-6).   insulin aspart protamine- aspart (70-30) 100 UNIT/ML injection Commonly known as:  NOVOLOG MIX 70/30 Inject 0.15 mLs (15 Units total) into the skin 2 (two) times daily with a meal.   insulin regular 100 units/mL injection Commonly known as:  NOVOLIN R,HUMULIN R Inject 10 Units into the skin daily with breakfast. If glucose less than 100 mg/dl pt does not take any; if glucose >100 mg/dl she takes Regular 10 units with breakfast   levETIRAcetam 500 MG tablet Commonly known as:  KEPPRA Take 500 mg by mouth 2 (two) times daily.   Menthol-Camphor 16-4 % Lotn Apply 1 application topically 2 (two) times daily as needed.   omeprazole 20 MG capsule Commonly known as:  PRILOSEC Take 20 mg by mouth daily.   pravastatin 40 MG tablet Commonly known as:  PRAVACHOL Take 40 mg by mouth at bedtime.   PRESERVISION AREDS 2 Caps Take 1 capsule by mouth daily.   sacubitril-valsartan 24-26 MG Commonly known as:  ENTRESTO Take 1 tablet by mouth 2 (two) times daily.   traMADol 50 MG tablet Commonly known as:  ULTRAM Take 1 tablet (50 mg total) by mouth every 6 (six) hours as needed.            Durable Medical Equipment  (From admission, onward)         Start     Ordered   02/09/18 1641  DME Bedside commode  Once    Question:  Patient needs a bedside commode to treat with the following condition  Answer:  Status post total hip replacement, left   02/09/18 1640   02/09/18 1641  DME Walker rolling  Once    Question:  Patient needs a walker to treat with the following condition  Answer:  Status post total hip replacement, left   02/09/18 1640   02/09/18 1641  DME 3 n 1  Once     02/09/18 1640          Contact information for follow-up providers    Hessie Knows, MD Follow up in 2 week(s).   Specialty:   Orthopedic Surgery Why:  For staple removal Contact information: [place] [address] [phone]            Contact information for after-discharge care    Destination    [facility] .   Service:  Skilled Nursing Contact information: [address] [place] [phone]                  Signed: Prescott Parma, Shaydon Lease 02/12/2018, 7:25 AM   Objective: Vital signs in last 24 hours: Temp:  [97.5 F (36.4 C)-99.9 F (37.7  C)] 99.9 F (37.7 C) (08/08 2302) Pulse Rate:  [59-67] 67 (08/08 2302) Resp:  [16-18] 16 (08/08 2302) BP: (103-127)/(49-51) 103/51 (08/08 2302) SpO2:  [89 %-99 %] 95 % (08/08 2302)  Intake/Output from previous day:  Intake/Output Summary (Last 24 hours) at 02/12/2018 0725 Last data filed at 02/12/2018 0537 Gross per 24 hour  Intake 1080 ml  Output 0 ml  Net 1080 ml    Intake/Output this shift: No intake/output data recorded.  Labs: Recent Labs    02/10/18 0552 02/11/18 0530 02/12/18 0635  HGB 11.7* 10.6* 10.9*   Recent Labs    02/11/18 0530 02/12/18 0635  WBC 10.6 10.5  RBC 3.54* 3.49*  HCT 31.5* 30.9*  PLT 177 177   Recent Labs    02/10/18 0552 02/11/18 0530  NA 138 133*  K 4.9 4.1  CL 101 97*  CO2 29 26  BUN 24* 25*  CREATININE 1.07* 1.04*  GLUCOSE 204* 207*  CALCIUM 8.8* 8.7*   No results for input(s): LABPT, INR in the last 72 hours.  EXAM: General - Patient is Alert and Oriented Extremity - Sensation intact distally Dorsiflexion/Plantar flexion intact Compartment soft Incision - clean, dry, with the wound VAC intact Motor Function -plantarflexion and dorsiflexion are intact  Assessment/Plan: 3 Days Post-Op Procedure(s) (LRB): TOTAL HIP ARTHROPLASTY ANTERIOR APPROACH (Left) Procedure(s) (LRB): TOTAL HIP ARTHROPLASTY ANTERIOR APPROACH (Left) Past Medical History:  Diagnosis Date  . Anemia   . Anxiety   .  Arthritis   . Benign neoplasm of colon   . CHF (congestive heart failure) ([place])   . Chronic kidney disease   . Chronic pain   . Complication of anesthesia 1980's   hard time waking up  . Coronary artery disease with unspecified angina pectoris   . Cough   . Diabetes mellitus without complication ([place])   . Essential hypertension   . High risk medication use   . Hyperlipidemia   . Plantar fascial fibromatosis   . Presence of permanent cardiac pacemaker   . Shortness of breath dyspnea    Active Problems:   Status post total hip replacement, left  Estimated body mass index is 32.52 kg/m as calculated from the following:   Height as of this encounter: 4\' 11"  (1.499 m).   Weight as of this encounter: 73 kg. Advance diet Up with therapy D/C IV fluids Discharge to SNF Diet - Regular diet Follow up - in 2 weeks Activity - WBAT Disposition - Rehab Condition Upon Discharge - Fair DVT Prophylaxis -Eliquis and TED hose  Reche Dixon, PA-C Orthopaedic Surgery 02/12/2018, 7:25 AM

## 2018-02-12 NOTE — Progress Notes (Signed)
Patient is medically stable for D/C to [facility] today. Per [facility] admissions coordinator at [facility] authorization has been received and patient can come today to room E-4. RN will call report and arrange EMS for transport. Clinical Education officer, museum (CSW) sent D/C orders to [employer] via [employer]. Patient is aware of above. Patient's daughter in [place] sister Nicole Kindred is aware of D/C today. Patient requested that CSW contact Nicole Kindred. CSW left Nicole Kindred a voicemail and then [place] walked in the room shortly after that and was made aware of above. Please reconsult if future social work needs arise. CSW signing off.   [employer], LCSW [phone]

## 2018-02-12 NOTE — Care Management Important Message (Signed)
Copy of signed IM left with patient in room.  

## 2018-02-12 NOTE — Progress Notes (Addendum)
   Subjective: 3 Days Post-Op Procedure(s) (LRB): TOTAL HIP ARTHROPLASTY ANTERIOR APPROACH (Left) Patient reports pain as moderate.   Patient is well, and has had no acute complaints or problems Denies any CP, SOB, ABD pain. We will continue therapy today.  She has done very little ambulation.  Objective: Vital signs in last 24 hours: Temp:  [97.5 F (36.4 C)-99.9 F (37.7 C)] 99.9 F (37.7 C) (08/08 2302) Pulse Rate:  [59-67] 67 (08/08 2302) Resp:  [16-18] 16 (08/08 2302) BP: (103-127)/(49-51) 103/51 (08/08 2302) SpO2:  [89 %-99 %] 95 % (08/08 2302)  Intake/Output from previous day: 08/08 0701 - 08/09 0700 In: 1080 [P.O.:1080] Out: 0  Intake/Output this shift: No intake/output data recorded.  Recent Labs    02/10/18 0552 02/11/18 0530 02/12/18 0635  HGB 11.7* 10.6* 10.9*   Recent Labs    02/11/18 0530 02/12/18 0635  WBC 10.6 10.5  RBC 3.54* 3.49*  HCT 31.5* 30.9*  PLT 177 177   Recent Labs    02/10/18 0552 02/11/18 0530  NA 138 133*  K 4.9 4.1  CL 101 97*  CO2 29 26  BUN 24* 25*  CREATININE 1.07* 1.04*  GLUCOSE 204* 207*  CALCIUM 8.8* 8.7*   No results for input(s): LABPT, INR in the last 72 hours.  EXAM General - Patient is Alert, Appropriate and Oriented Extremity - Neurovascular intact Sensation intact distally Intact pulses distally Incision: dressing C/D/I and no drainage No cellulitis present Compartment soft Dressing - dressing C/D/I, wound VAC intact.  Motor Function - intact, moving foot and toes well on exam.  Ambulated small amounts yesterday  Past Medical History:  Diagnosis Date  . Anemia   . Anxiety   . Arthritis   . Benign neoplasm of colon   . CHF (congestive heart failure) ([place])   . Chronic kidney disease   . Chronic pain   . Complication of anesthesia 1980's   hard time waking up  . Coronary artery disease with unspecified angina pectoris   . Cough   . Diabetes mellitus without complication ([place])   . Essential  hypertension   . High risk medication use   . Hyperlipidemia   . Plantar fascial fibromatosis   . Presence of permanent cardiac pacemaker   . Shortness of breath dyspnea     Assessment/Plan:   3 Days Post-Op Procedure(s) (LRB): TOTAL HIP ARTHROPLASTY ANTERIOR APPROACH (Left) Active Problems:   Status post total hip replacement, left  Estimated body mass index is 32.52 kg/m as calculated from the following:   Height as of this encounter: 4\' 11"  (1.499 m).   Weight as of this encounter: 73 kg. Advance diet Up with therapy  Needs bowel movement  Care management to assist with discharge to skilled nursing today, pending insurance approval  DVT Prophylaxis - TED hose and Eliquis, SCDs Weight-Bearing as tolerated to left leg   Reche Dixon, PA-C [place] 02/12/2018, 7:23 AM

## 2018-02-12 NOTE — Clinical Social Work Placement (Signed)
   CLINICAL SOCIAL WORK PLACEMENT  NOTE  Date:  02/12/2018  Patient Details  Name: Amber [name] MRN: [id number] Date of Birth: [birth year]  Clinical Social Work is seeking post-discharge placement for this patient at the [place] level of care (*CSW will initial, date and re-position this form in  chart as items are completed):  Yes   Patient/family provided with [place] Work Department's list of facilities offering this level of care within the geographic area requested by the patient (or if unable, by the patient's family).  Yes   Patient/family informed of their freedom to choose among providers that offer the needed level of care, that participate in Medicare, Medicaid or managed care program needed by the patient, have an available bed and are willing to accept the patient.  Yes   Patient/family informed of [place]'s ownership interest in [facility] and [facility], as well as of the fact that they are under no obligation to receive care at these facilities.  PASRR submitted to EDS on       PASRR number received on       Existing PASRR number confirmed on 02/10/18     FL2 transmitted to all facilities in geographic area requested by pt/family on 02/10/18     FL2 transmitted to all facilities within larger geographic area on       Patient informed that his/her managed care company has contracts with or will negotiate with certain facilities, including the following:        Yes   Patient/family informed of bed offers received.  Patient chooses bed at [facility] )     Physician recommends and patient chooses bed at      Patient to be transferred to [facility] ) on 02/12/18.  Patient to be transferred to facility by [facility] )     Patient family notified on 02/12/18 of transfer.  Name of family member notified:  (Patient's daughter in [place] sister Vivien Rota is aware of D/C today. )     PHYSICIAN        Additional Comment:    _______________________________________________ Bennett Ram, Veronia Beets, LCSW 02/12/2018, 11:13 AM

## 2018-02-12 NOTE — Progress Notes (Signed)
Physical Therapy Treatment Patient Details Name: Amber [name] MRN: [id number] DOB: April 30, 1930 Today's Date: 02/12/2018    History of Present Illness "Amber [name]" Amber [name] is an 84yo female who comes to [facility] for elective Lt THA in the setting of AVN, direct anterio aproach c Dr. Hessie Knows. PMH: GAD, anemia, OA, CAD, DM, HTN, HLD, PPM, Ankle Fracture, drop out episodes per patient now on antiepileptic meds.     PT Comments     Pt received in bed, RN in room. Full HEP program reviewed in bed on BLE. Pt able to perform on non-operative side with high levels of exertion required, more verbal cues needed than prior sessions. LLE continues to require min-modA physical assist for performance at the hip and knee level, which is improved from 1 day ago. Pt tolerating more flexion ROM in operative hip in heel slides. O2 remains variable on room air in session checked multiple times throughout ranging 92-96%. Pt endorses being congested with is easily audible when speaking, hence not clear that nasal cannula is effectively delivering O2 nasally. Pt reports ortho still encouraging to AMB in hall, but [name] emphasizes this is not appropriate at this time as pt has been using W/C for most mobility for >1year, AMB typically only to access the bathroom at home. Pt also has significantly poor left quads control in bed exercises, multiple episodes of knee buckling in transfers yesterday. Pt progressing slowly, but clear signs of improvement since 1DA.    Follow Up Recommendations  SNF;Supervision for mobility/OOB     Equipment Recommendations  None recommended by PT    Recommendations for Other Services       Precautions / Restrictions Precautions Precautions: Fall;None Restrictions Weight Bearing Restrictions: Yes LLE Weight Bearing: Weight bearing as tolerated    Mobility  Bed Mobility Overal bed mobility: (deferred this session)                Transfers                     Ambulation/Gait                 Stairs             Wheelchair Mobility    Modified Rankin (Stroke Patients Only)       Balance                                            Cognition Arousal/Alertness: (drowsy, similar to 1DA ) Behavior During Therapy: WFL for tasks assessed/performed Overall Cognitive Status: Within Functional Limits for tasks assessed                                        Exercises   All performed in supine on the operative side unless otherwise indicated:  *care taken to observe postoperative hip precautions as indicated by orthopedist  -Hip Abduction AA/ROM: 15x Bilat  -Hip Adduction AA/ROM: 15x Bilat  -Heel slides AA/ROM: 15x (modA)   -Glute set (heel on pillow): 10x3secH  -Short Arc Quads AA/ROM: 15X       General Comments        Pertinent Vitals/Pain Pain Assessment: No/denies pain Faces Pain Scale: Hurts a little bit Pain Intervention(s): Limited activity within patient's tolerance;Monitored during session  Home Living                      Prior Function            PT Goals (current goals can now be found in the care plan section) Acute Rehab PT Goals Patient Stated Goal: have less pain PT Goal Formulation: With patient Time For Goal Achievement: 02/24/18 Potential to Achieve Goals: Good Progress towards PT goals: Progressing toward goals    Frequency    BID      PT Plan Current plan remains appropriate    Co-evaluation              AM-PAC PT "6 Clicks" Daily Activity  Outcome Measure  Difficulty turning over in bed (including adjusting bedclothes, sheets and blankets)?: Unable Difficulty moving from lying on back to sitting on the side of the bed? : Unable Difficulty sitting down on and standing up from a chair with arms (e.g., wheelchair, bedside commode, etc,.)?: Unable Help needed moving to and from a bed to chair (including a wheelchair)?: A  Lot Help needed walking in hospital room?: Total Help needed climbing 3-5 steps with a railing? : Total 6 Click Score: 7    End of Session Equipment Utilized During Treatment: Oxygen Activity Tolerance: Patient tolerated treatment well;Patient limited by fatigue;Patient limited by pain Patient left: with call bell/phone within reach;with SCD's reapplied;in chair;with chair alarm set Nurse Communication: Mobility status PT Visit Diagnosis: Difficulty in walking, not elsewhere classified (R26.2);Muscle weakness (generalized) (M62.81);Dizziness and giddiness (R42);Other abnormalities of gait and mobility (R26.89)     Time: 2119-4174 PT Time Calculation (min) (ACUTE ONLY): 24 min  Charges:  $Therapeutic Exercise: 8-22 mins $Therapeutic Activity: 8-22 mins                     9:46 AM, 02/12/18 Etta Grandchild, PT, DPT Physical Therapist - [facility]  [phone] ([place])    [place] C 02/12/2018, 9:40 AM

## 2018-02-16 DIAGNOSIS — I4891 Unspecified atrial fibrillation: Secondary | ICD-10-CM | POA: Diagnosis not present

## 2018-02-16 DIAGNOSIS — I495 Sick sinus syndrome: Secondary | ICD-10-CM | POA: Diagnosis not present

## 2018-02-16 DIAGNOSIS — Z950 Presence of cardiac pacemaker: Secondary | ICD-10-CM | POA: Diagnosis not present

## 2018-02-16 DIAGNOSIS — Z96642 Presence of left artificial hip joint: Secondary | ICD-10-CM | POA: Diagnosis not present

## 2018-02-16 DIAGNOSIS — M810 Age-related osteoporosis without current pathological fracture: Secondary | ICD-10-CM | POA: Diagnosis not present

## 2018-02-16 DIAGNOSIS — J45909 Unspecified asthma, uncomplicated: Secondary | ICD-10-CM | POA: Diagnosis not present

## 2018-02-16 DIAGNOSIS — I10 Essential (primary) hypertension: Secondary | ICD-10-CM | POA: Diagnosis not present

## 2018-02-26 ENCOUNTER — Other Ambulatory Visit: Payer: Self-pay

## 2018-02-26 ENCOUNTER — Encounter: Payer: Self-pay | Admitting: Urology

## 2018-02-26 ENCOUNTER — Ambulatory Visit (INDEPENDENT_AMBULATORY_CARE_PROVIDER_SITE_OTHER): Payer: Medicare HMO | Admitting: Urology

## 2018-02-26 VITALS — BP 153/75 | HR 59

## 2018-02-26 DIAGNOSIS — R310 Gross hematuria: Secondary | ICD-10-CM

## 2018-02-26 DIAGNOSIS — N200 Calculus of kidney: Secondary | ICD-10-CM

## 2018-02-26 MED ORDER — LIDOCAINE HCL URETHRAL/MUCOSAL 2 % EX GEL
1.0000 "application " | Freq: Once | CUTANEOUS | Status: AC
  Administered 2018-02-26: 1 via URETHRAL

## 2018-02-26 NOTE — Progress Notes (Signed)
   02/26/18  CC:  Chief Complaint  Patient presents with  . Cysto    HPI: 84 year old female with history of gross hematuria who presents today for office cystoscopy.  She underwent noncontrast CT scan secondary to an iodine allergy which showed a 4 mm left nonobstructing calculus, otherwise no GU pathology.  Since her last visit, she is undergone left hip replacement secondary to avascular necrosis.  She is currently in rehab.  NED. A&Ox3.   No respiratory distress   Abd soft, NT, ND Normal external genitalia with patent urethral meatus  Cystoscopy Procedure Note  Patient identification was confirmed, informed consent was obtained, and patient was prepped using Betadine solution.  Lidocaine jelly was administered per urethral meatus.    Preoperative abx where received prior to procedure.    Procedure: - Flexible cystoscope introduced, without any difficulty.   - Thorough search of the bladder revealed:    normal urethral meatus    normal urothelium    no stones    no ulcers     no tumors    no urethral polyps    no trabeculation  - Ureteral orifices were normal in position and appearance.  Post-Procedure: - Patient tolerated the procedure well  Assessment/ Plan:  1. Gross hematuria Cysto negative today No obvious pathology on CT scan other than small 4 mm stone Incomplete imaging without contrast but given age and commodities, agree that this is a reasonable approach Consider renal ultrasound to assess for any occult lesions/tumors if she continues to have gross hematuria  2. Nephrolithiasis Presumably asymptomatic nonobstructing stones No surgical intervention recommended  Return in about 3 months (around 05/29/2018) for shannon recheck.   Hollice Espy, MD

## 2018-03-09 DIAGNOSIS — I509 Heart failure, unspecified: Secondary | ICD-10-CM | POA: Diagnosis not present

## 2018-03-09 DIAGNOSIS — E119 Type 2 diabetes mellitus without complications: Secondary | ICD-10-CM | POA: Diagnosis not present

## 2018-03-09 DIAGNOSIS — E782 Mixed hyperlipidemia: Secondary | ICD-10-CM | POA: Diagnosis not present

## 2018-03-09 DIAGNOSIS — F411 Generalized anxiety disorder: Secondary | ICD-10-CM | POA: Diagnosis not present

## 2018-03-09 DIAGNOSIS — I10 Essential (primary) hypertension: Secondary | ICD-10-CM | POA: Diagnosis not present

## 2018-03-09 DIAGNOSIS — Z794 Long term (current) use of insulin: Secondary | ICD-10-CM | POA: Diagnosis not present

## 2018-03-09 DIAGNOSIS — I5022 Chronic systolic (congestive) heart failure: Secondary | ICD-10-CM | POA: Diagnosis not present

## 2018-03-09 DIAGNOSIS — Z79899 Other long term (current) drug therapy: Secondary | ICD-10-CM | POA: Diagnosis not present

## 2018-03-11 ENCOUNTER — Other Ambulatory Visit: Payer: Self-pay | Admitting: *Deleted

## 2018-03-11 NOTE — Patient Outreach (Signed)
[facility]) Care Management  03/11/2018  Emmamarie Kluender Amaker 11-11-1929 [id number]   Transition of Care Referral   Referral Date: 03/03/18 Referral Source: [facility] referral  Date of Admission:  Diagnosis: status post left total hip arthroplasty performed by Dr. Rudene Christians on 02/09/2018 Date of Discharge: 03/01/18 Facility:  [place]: [place] attempt # 1 No answer. [facility] RN CM left HIPAA compliant voicemail message along with CM's contact info.   Social: widowed   Conditions: right hip replacement, TRK replacement osteoarthritis f left hip, mild aortic valve stenosis, Chronic CHF, systolic, pacemaker, SSS, DM, anemia , hyperlipidemia, syncope,    Appointments: 03/09/18 Dr Louis Meckel  [facility] hospital  f/u 06/07/18  Rulon Sera urology visit   Plan: [facility] RN CM sent an unsuccessful outreach letter and scheduled this patient for another call attempt within 4 business days  Ashley L. Lavina Hamman, RN, BSN, [place] Management Care Coordinator Direct Number [phone] Mobile number [phone]  Main [facility] number [phone] Fax number [phone]

## 2018-03-12 ENCOUNTER — Other Ambulatory Visit: Payer: Self-pay | Admitting: *Deleted

## 2018-03-12 NOTE — Patient Outreach (Addendum)
[facility] [facility]) Care Management  03/12/2018  Amber [name] 1929-07-21 [id number]   Transition of Care Referral  Referral Date: 03/03/18 Referral Source: [facility] referral  Date of Admission:  Diagnosis: status post left total hip arthroplasty performed by Dr. Rudene Christians on 02/09/2018 Date of Discharge: 03/01/18 Facility:  [place]: [place] attempt # 2 No answer. [facility] RN CM left HIPAA compliant voicemail message along with CM's contact info.   Social: widowed   Conditions: right hip replacement, TRK replacement osteoarthritis f left hip, mild aortic valve stenosis, Chronic CHF, systolic, pacemaker, SSS, DM, anemia , hyperlipidemia, syncope,    Appointments: 03/09/18 Dr Louis Meckel  [facility]  f/u 06/07/18  Rulon Sera urology visit   Plan: [facility] RN CM scheduled this patient for a third call attempt within 4 business days  Kimberly L. Lavina Hamman, RN, BSN, [place] Management Care Coordinator Direct Number [phone] Mobile number [phone]  Main [facility] number [phone] Fax number [phone]

## 2018-03-17 ENCOUNTER — Other Ambulatory Visit: Payer: Self-pay | Admitting: *Deleted

## 2018-03-17 NOTE — Patient Outreach (Signed)
[place] [facility]) Care Management  03/17/2018  Magdala Brahmbhatt Carnell 08/27/1929 [id number]   Transition of Care Referral  Referral Date:03/03/18 Referral Source:[employer] referral Date of Admission:  Diagnosis:status post left total hip arthroplasty performed by Dr. Rudene Christians on 02/09/2018 Date of Discharge:03/01/18 Facility:[facility] attempt #3 No answer. An automated response states the person at Mrs Speak number listed in EPIC for her home and mobile numbers (plus the same number is listed for her daughter's mobile number) is unavailable at this time and a message could not be left   Social:widowed  Conditions:right hip replacement, TRK replacement osteoarthritis f left hip, mild aortic valve stenosis, Chronic CHF, systolic, pacemaker, SSS, DM, anemia , hyperlipidemia, syncope,   Appointments:03/09/18 Dr Doy Hutching prmiary [facility] hospitalf/u 06/07/18 Rulon Sera urology visit   Plan: [facility] RN CM will prepare for case closure per call attempts workflow if no return call from this patient.  An unsuccessful outreach letter was sent on 03/11/18.   Dionisia Pacholski L. Lavina Hamman, RN, BSN, [place] Coordinator Office number [phone] Mobile number [phone]  Main [facility] number [phone] Fax number [phone]

## 2018-04-13 ENCOUNTER — Other Ambulatory Visit: Payer: Self-pay | Admitting: *Deleted

## 2018-04-13 NOTE — Patient Outreach (Signed)
[facility] [facility]) Care Management  04/13/2018  Amber [name] Jul 24, 1929 [id number]   Case closure   Call attempts made on 03/11/18, 03/12/18 and 03/17/18 Unsuccessful outreach letter sent on 03/11/18 without a response   Plan [facility] RN CM will close case after no response from patient  Unable to reach   [name]. Lavina Hamman, RN, BSN, [place] Coordinator Office number [phone] Mobile number [phone]  Main [facility] number [phone] Fax number [phone]

## 2018-05-17 DIAGNOSIS — I10 Essential (primary) hypertension: Secondary | ICD-10-CM | POA: Diagnosis not present

## 2018-05-17 DIAGNOSIS — E1159 Type 2 diabetes mellitus with other circulatory complications: Secondary | ICD-10-CM | POA: Diagnosis not present

## 2018-05-17 DIAGNOSIS — Z794 Long term (current) use of insulin: Secondary | ICD-10-CM | POA: Diagnosis not present

## 2018-05-17 DIAGNOSIS — E1169 Type 2 diabetes mellitus with other specified complication: Secondary | ICD-10-CM | POA: Diagnosis not present

## 2018-05-17 DIAGNOSIS — E785 Hyperlipidemia, unspecified: Secondary | ICD-10-CM | POA: Diagnosis not present

## 2018-05-17 DIAGNOSIS — E1142 Type 2 diabetes mellitus with diabetic polyneuropathy: Secondary | ICD-10-CM | POA: Diagnosis not present

## 2018-05-18 DIAGNOSIS — I495 Sick sinus syndrome: Secondary | ICD-10-CM | POA: Diagnosis not present

## 2018-06-07 ENCOUNTER — Ambulatory Visit: Payer: Medicare HMO | Admitting: Urology

## 2018-06-10 DIAGNOSIS — Z79899 Other long term (current) drug therapy: Secondary | ICD-10-CM | POA: Diagnosis not present

## 2018-06-10 DIAGNOSIS — E1159 Type 2 diabetes mellitus with other circulatory complications: Secondary | ICD-10-CM | POA: Diagnosis not present

## 2018-06-10 DIAGNOSIS — E119 Type 2 diabetes mellitus without complications: Secondary | ICD-10-CM | POA: Diagnosis not present

## 2018-06-10 DIAGNOSIS — I10 Essential (primary) hypertension: Secondary | ICD-10-CM | POA: Diagnosis not present

## 2018-06-10 DIAGNOSIS — Z23 Encounter for immunization: Secondary | ICD-10-CM | POA: Diagnosis not present

## 2018-06-10 DIAGNOSIS — E1142 Type 2 diabetes mellitus with diabetic polyneuropathy: Secondary | ICD-10-CM | POA: Diagnosis not present

## 2018-06-10 DIAGNOSIS — Z794 Long term (current) use of insulin: Secondary | ICD-10-CM | POA: Diagnosis not present

## 2018-06-14 ENCOUNTER — Ambulatory Visit: Payer: Medicare HMO | Admitting: Urology

## 2018-06-14 NOTE — Progress Notes (Deleted)
06/14/2018 12:35 PM   Amber [name] September 06, 1929 [id number]  Referring provider: Idelle Crouch, MD [facility] [place], [place] [address]  No chief complaint on file.   HPI: Patient is a 84 -year-old Caucasian female with a history of hematuria and nephrolithiasis who presents today for follow up.   Background history Referral from [facility] ED for gross hematuria with daughter in law, [name].  She urinated blood on two occassions.  On the first occasion, she was given an antibiotic and it cleared right up.  The blood in the urine returned and she was referred to [place].  She does not have a prior history of recurrent urinary tract infections, nephrolithiasis, trauma to the genitourinary tract or malignancies of the genitourinary tract.   She does not have a family medical history of nephrolithiasis, malignancies of the genitourinary tract or hematuria.  She wears a pad.  She had a bladder tacking years ago.  She is not a smoker. She is not exposed to secondhand smoke.  She has not worked with Sports administrator, trichloroethylene, etc.   She has a high BMI.    Non contrast CT on 02/08/2018 revealed nonobstructing LEFT renal calculus.  No ureterolithiasis or obstructive uropathy.  No bladder calculi.  LEFT colon and  sigmoid diverticulosis without diverticulitis.  Aortic Atherosclerosis.  Cysto on 02/26/2018 with Dr. Erlene Quan was negative.    Today, ***.     PMH: Past Medical History:  Diagnosis Date  . Anemia   . Anxiety   . Arthritis   . Benign neoplasm of colon   . CHF (congestive heart failure) ([place])   . Chronic kidney disease   . Chronic pain   . Complication of anesthesia 1980's   hard time waking up  . Coronary artery disease with unspecified angina pectoris   . Cough   . Diabetes mellitus without complication ([place])   . Essential hypertension   . High risk medication use   . Hyperlipidemia   . Plantar fascial fibromatosis   . Presence  of permanent cardiac pacemaker   . Shortness of breath dyspnea     Surgical History: Past Surgical History:  Procedure Laterality Date  . ABDOMINAL HYSTERECTOMY    . BACK SURGERY  1960's   cage and screws in lower back  . CATARACT EXTRACTION W/ INTRAOCULAR LENS  IMPLANT, BILATERAL Bilateral   . CORONARY ARTERY BYPASS GRAFT  2008   triple  . JOINT REPLACEMENT Right    hip and knee  . ORIF ANKLE FRACTURE Right 07/27/2014   Procedure: OPEN REDUCTION INTERNAL FIXATION (ORIF) ANKLE FRACTURE;  Surgeon: Alta Corning, MD;  Location: [facility];  Service: Orthopedics;  Laterality: Right;  . PACEMAKER INSERTION Left 03/14/2015   Procedure: INSERTION PACEMAKER;  Surgeon: Isaias Cowman, MD;  Location: [facility];  Service: Cardiovascular;  Laterality: Left;  . ROTATOR CUFF REPAIR Left   . TOTAL HIP ARTHROPLASTY Right   . TOTAL HIP ARTHROPLASTY Left 02/09/2018   Procedure: TOTAL HIP ARTHROPLASTY ANTERIOR APPROACH;  Surgeon: Hessie Knows, MD;  Location: [facility];  Service: Orthopedics;  Laterality: Left;  . TOTAL KNEE ARTHROPLASTY Right   . VEIN LIGATION AND STRIPPING      Home Medications:  Allergies as of 06/14/2018      Reactions   Ambien [zolpidem Tartrate] Other (See Comments)   Reaction:  Keeps pt awake    Penicillins Itching, Other (See Comments)   Has patient had a PCN reaction causing immediate  rash, facial/tongue/throat swelling, SOB or lightheadedness with hypotension: No Has patient had a PCN reaction causing severe rash involving mucus membranes or skin necrosis: No Has patient had a PCN reaction that required hospitalization No Has patient had a PCN reaction occurring within the last 10 years: No If all of the above answers are "NO", then may proceed with Cephalosporin use.   Iodine Itching   Succinylcholine Other (See Comments)   Reaction:  Unknown    Etodolac Other (See Comments)   GI upset   Nsaids Other (See Comments)   Reaction: gi upset      Medication List         Accurate as of 06/14/18 12:35 PM. Always use your most recent med list.          acetaminophen 500 MG tablet Commonly known as:  TYLENOL Take 1,000 mg by mouth every 6 (six) hours as needed.   albuterol 108 (90 Base) MCG/ACT inhaler Commonly known as:  PROVENTIL HFA;VENTOLIN HFA Inhale 2 puffs into the lungs every 6 (six) hours as needed for wheezing or shortness of breath.   alendronate 70 MG tablet Commonly known as:  FOSAMAX Take 70 mg by mouth every Thursday.   ALPRAZolam 1 MG tablet Commonly known as:  XANAX Take 1 tablet (1 mg total) by mouth at bedtime as needed for anxiety.   amLODipine 10 MG tablet Commonly known as:  NORVASC Take 5 mg by mouth daily.   calcium carbonate 1500 (600 Ca) MG Tabs tablet Commonly known as:  OSCAL Take 600 mg of elemental calcium by mouth daily with breakfast.   carvedilol 6.25 MG tablet Commonly known as:  COREG Take 1 tablet (6.25 mg total) by mouth 2 (two) times daily with a meal.   ELIQUIS 5 MG Tabs tablet Generic drug:  apixaban Take 5 mg by mouth 2 (two) times daily.   furosemide 20 MG tablet Commonly known as:  LASIX TAKE ONE TABLET BY MOUTH ONCE DAILY   gabapentin 100 MG capsule Commonly known as:  NEURONTIN Take 200 mg by mouth 3 (three) times daily.   HYDROcodone-acetaminophen 5-325 MG tablet Commonly known as:  NORCO/VICODIN Take 1-2 tablets by mouth every 4 (four) hours as needed for moderate pain (pain score 4-6).   insulin aspart protamine- aspart (70-30) 100 UNIT/ML injection Commonly known as:  NOVOLOG MIX 70/30 Inject 0.15 mLs (15 Units total) into the skin 2 (two) times daily with a meal.   insulin regular 100 units/mL injection Commonly known as:  NOVOLIN R,HUMULIN R Inject 10 Units into the skin daily with breakfast. If glucose less than 100 mg/dl pt does not take any; if glucose >100 mg/dl she takes Regular 10 units with breakfast   levETIRAcetam 500 MG tablet Commonly known as:  KEPPRA Take 500 mg by  mouth 2 (two) times daily.   Menthol-Camphor 16-4 % Lotn Apply 1 application topically 2 (two) times daily as needed.   omeprazole 20 MG capsule Commonly known as:  PRILOSEC Take 20 mg by mouth daily.   pravastatin 40 MG tablet Commonly known as:  PRAVACHOL Take 40 mg by mouth at bedtime.   PRESERVISION AREDS 2 Caps Take 1 capsule by mouth daily.   sacubitril-valsartan 24-26 MG Commonly known as:  ENTRESTO Take 1 tablet by mouth 2 (two) times daily.   traMADol 50 MG tablet Commonly known as:  ULTRAM Take 1 tablet (50 mg total) by mouth every 6 (six) hours as needed.       Allergies:  Allergies  Allergen Reactions  . Ambien [Zolpidem Tartrate] Other (See Comments)    Reaction:  Keeps pt awake   . Penicillins Itching and Other (See Comments)    Has patient had a PCN reaction causing immediate rash, facial/tongue/throat swelling, SOB or lightheadedness with hypotension: No Has patient had a PCN reaction causing severe rash involving mucus membranes or skin necrosis: No Has patient had a PCN reaction that required hospitalization No Has patient had a PCN reaction occurring within the last 10 years: No If all of the above answers are "NO", then may proceed with Cephalosporin use.  . Iodine Itching  . Succinylcholine Other (See Comments)    Reaction:  Unknown   . Etodolac Other (See Comments)    GI upset  . Nsaids Other (See Comments)    Reaction: gi upset    Family History: Family History  Problem Relation Age of Onset  . Heart attack Mother   . Heart disease Father   . Alzheimer's disease Sister   . Cervical cancer Sister   . Heart failure Son     Social History:  reports that she has never smoked. She has never used smokeless tobacco. She reports that she does not drink alcohol or use drugs.  ROS:                                        Physical Exam: There were no vitals taken for this visit.  Constitutional: Well nourished. Alert  and oriented, No acute distress. HEENT: [place] AT, moist mucus membranes. Trachea midline, no masses. Cardiovascular: No clubbing, cyanosis, or edema. Respiratory: Normal respiratory effort, no increased work of breathing. Skin: No rashes, bruises or suspicious lesions. Neurologic: Grossly intact, no focal deficits, moving all 4 extremities. Psychiatric: Normal mood and affect.  Laboratory Data: Lab Results  Component Value Date   WBC 10.5 02/12/2018   HGB 10.9 (L) 02/12/2018   HCT 30.9 (L) 02/12/2018   MCV 88.6 02/12/2018   PLT 177 02/12/2018    Lab Results  Component Value Date   CREATININE 1.04 (H) 02/11/2018    No results found for: PSA  No results found for: TESTOSTERONE  Lab Results  Component Value Date   HGBA1C 8.8 (H) 01/27/2017    No results found for: TSH     Component Value Date/Time   CHOL 177 07/26/2015 0133   HDL 61 07/26/2015 0133   CHOLHDL 2.9 07/26/2015 0133   VLDL 24 07/26/2015 0133   LDLCALC 92 07/26/2015 0133    Lab Results  Component Value Date   AST 19 12/22/2017   Lab Results  Component Value Date   ALT 12 (L) 12/22/2017   No components found for: ALKALINEPHOPHATASE No components found for: BILIRUBINTOTAL  No results found for: ESTRADIOL  I have reviewed the labs.   Assessment & Plan:    1. History of hematuria Hematuria work up completed in 02/2018 with non contrast CT and cysto - findings positive for left non obstructing nephrolithiasis No report of gross hematuria - will pursue RUS if she experiences further gross hematuria  2. Nephrolithiasis Non obstructing 4 mm left renal calculus No intervention at this time Continue surveillance - KUB in one year 06/2019  Patient is advised that if they should start to experience pain that is not able to be controlled with pain medication, intractable nausea and/or vomiting and/or fevers greater than 103 or  shaking chills to contact the office immediately or seek treatment in the  emergency department for emergent intervention.     No follow-ups on file.  These notes generated with voice recognition software. I apologize for typographical errors.  Zara Council, PA-C  [facility] [address] [place]  [place], [place] [address] [phone]

## 2018-09-01 ENCOUNTER — Other Ambulatory Visit: Payer: Self-pay

## 2018-09-01 ENCOUNTER — Encounter: Payer: Self-pay | Admitting: *Deleted

## 2018-09-01 ENCOUNTER — Emergency Department: Payer: Medicare HMO

## 2018-09-01 ENCOUNTER — Emergency Department
Admission: EM | Admit: 2018-09-01 | Discharge: 2018-09-01 | Disposition: A | Discharge status: Home / Self Care | Payer: Medicare HMO | Attending: Emergency Medicine | Admitting: Emergency Medicine

## 2018-09-01 DIAGNOSIS — Z96651 Presence of right artificial knee joint: Secondary | ICD-10-CM | POA: Insufficient documentation

## 2018-09-01 DIAGNOSIS — Z7901 Long term (current) use of anticoagulants: Secondary | ICD-10-CM | POA: Diagnosis not present

## 2018-09-01 DIAGNOSIS — Z794 Long term (current) use of insulin: Secondary | ICD-10-CM | POA: Insufficient documentation

## 2018-09-01 DIAGNOSIS — Z8673 Personal history of transient ischemic attack (TIA), and cerebral infarction without residual deficits: Secondary | ICD-10-CM | POA: Diagnosis not present

## 2018-09-01 DIAGNOSIS — Z96641 Presence of right artificial hip joint: Secondary | ICD-10-CM | POA: Insufficient documentation

## 2018-09-01 DIAGNOSIS — E1122 Type 2 diabetes mellitus with diabetic chronic kidney disease: Secondary | ICD-10-CM | POA: Diagnosis not present

## 2018-09-01 DIAGNOSIS — I130 Hypertensive heart and chronic kidney disease with heart failure and stage 1 through stage 4 chronic kidney disease, or unspecified chronic kidney disease: Secondary | ICD-10-CM | POA: Insufficient documentation

## 2018-09-01 DIAGNOSIS — Z96642 Presence of left artificial hip joint: Secondary | ICD-10-CM | POA: Diagnosis not present

## 2018-09-01 DIAGNOSIS — Z951 Presence of aortocoronary bypass graft: Secondary | ICD-10-CM | POA: Insufficient documentation

## 2018-09-01 DIAGNOSIS — I509 Heart failure, unspecified: Secondary | ICD-10-CM | POA: Diagnosis not present

## 2018-09-01 DIAGNOSIS — Z79899 Other long term (current) drug therapy: Secondary | ICD-10-CM | POA: Insufficient documentation

## 2018-09-01 DIAGNOSIS — N189 Chronic kidney disease, unspecified: Secondary | ICD-10-CM | POA: Insufficient documentation

## 2018-09-01 DIAGNOSIS — M25551 Pain in right hip: Secondary | ICD-10-CM | POA: Diagnosis present

## 2018-09-01 MED ORDER — TRAMADOL HCL 50 MG PO TABS: 50.0000 mg | ORAL_TABLET | Freq: Four times a day (QID) | ORAL | 0 refills | Status: DC | PRN

## 2018-09-01 MED ORDER — PREDNISONE 20 MG PO TABS
60.0000 mg | ORAL_TABLET | Freq: Once | ORAL | Status: AC
  Administered 2018-09-01: 60 mg via ORAL
  Filled 2018-09-01: qty 3

## 2018-09-01 MED ORDER — TRAMADOL HCL 50 MG PO TABS
50.0000 mg | ORAL_TABLET | Freq: Once | ORAL | Status: AC
  Administered 2018-09-01: 50 mg via ORAL
  Filled 2018-09-01: qty 1

## 2018-09-01 MED ORDER — PREDNISONE 50 MG PO TABS: 50.0000 mg | ORAL_TABLET | Freq: Every day | ORAL | 0 refills | Status: DC

## 2018-09-01 NOTE — ED Provider Notes (Signed)
[facility] Emergency Department Provider Note  ____________________________________________  Time seen: Approximately 4:24 PM  I have reviewed the triage vital signs and the nursing notes.   HISTORY  Chief Complaint Hip Pain    HPI Amber [name] is a 85 y.o. female who presents the emergency department complaining of right hip pain.  Patient reports that she had a hip replacement approximately 14 years ago.  Patient has had a steady increase of pain over the past 4 days.  She reports that she was getting in bed when she felt pain to the right hip.  She denies any direct trauma to the hip.  Patient denies any radicular symptoms.  She denies any lower back pain.  Patient states that she can "feel the bone" to the lateral aspect of her hip.  Patient reports that since the surgery she has been able to remotely feel this area but over the past week she has been able to distinctly feel a hard bony like surface to the lateral hip.  Patient denies any edema to the area.  She has not contacted orthopedics who did the surgery prior to presenting to the emergency department.  Patient has a significant medical history to include anemia, severe arthritis, CHF, chronic kidney disease, coronary artery disease, diabetes, hypertension, hyperlipidemia.  Patient denies any complaints with chronic medical problems.    Past Medical History:  Diagnosis Date  . Anemia   . Anxiety   . Arthritis   . Benign neoplasm of colon   . CHF (congestive heart failure) ([place])   . Chronic kidney disease   . Chronic pain   . Complication of anesthesia 1980's   hard time waking up  . Coronary artery disease with unspecified angina pectoris   . Cough   . Diabetes mellitus without complication ([place])   . Essential hypertension   . High risk medication use   . Hyperlipidemia   . Plantar fascial fibromatosis   . Presence of permanent cardiac pacemaker   . Shortness of breath dyspnea      Patient Active Problem List   Diagnosis Date Noted  . Status post total hip replacement, left 02/09/2018  . Primary osteoarthritis of left hip 01/06/2018  . Chronic pain syndrome 04/09/2017  . Other specified health status 04/08/2017  . Disorder of bone, unspecified 04/08/2017  . Long term current use of opiate analgesic 04/08/2017  . Chronic low back pain (Primary Area of Pain) (Bilateral) (L>R) 04/08/2017  . Chronic pain of both lower extremities  (Tertiary Area of Pain) 04/08/2017  . Chronic groin pain (Secondary Area of Pain) 04/08/2017  . Chronic neck pain (Fourth Area of Pain) (Bilateral) (L>R) 04/08/2017  . Chronic sacroiliac joint pain 04/08/2017  . Other long term (current) drug therapy 04/08/2017  . Syncope 02/09/2017  . Asthmatic bronchitis 01/26/2017  . Fall 10/06/2016  . Anxiety 08/08/2016  . TIA (transient ischemic attack) 07/25/2015  . Sick sinus syndrome ([place]) 03/14/2015  . Chronic heart failure ([place]) 01/05/2015  . Fracture dislocation of ankle 07/27/2014  . IDDM (insulin dependent diabetes mellitus) ([place]) 07/27/2014  . Essential hypertension 07/27/2014  . Multiple rib fractures 07/27/2014  . 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 Anemia 12/06/2013    Past Surgical History:  Procedure Laterality Date  . ABDOMINAL HYSTERECTOMY    . BACK SURGERY  1960's   cage and screws in lower back  . CATARACT EXTRACTION W/ INTRAOCULAR LENS  IMPLANT, BILATERAL Bilateral   . CORONARY ARTERY BYPASS  GRAFT  2008   triple  . JOINT REPLACEMENT Right    hip and knee  . ORIF ANKLE FRACTURE Right 07/27/2014   Procedure: OPEN REDUCTION INTERNAL FIXATION (ORIF) ANKLE FRACTURE;  Surgeon: Alta Corning, MD;  Location: [facility];  Service: Orthopedics;  Laterality: Right;  . PACEMAKER INSERTION Left 03/14/2015   Procedure: INSERTION PACEMAKER;  Surgeon: Isaias Cowman, MD;  Location: [facility];  Service: Cardiovascular;  Laterality: Left;  . ROTATOR CUFF REPAIR Left   . TOTAL HIP  ARTHROPLASTY Right   . TOTAL HIP ARTHROPLASTY Left 02/09/2018   Procedure: TOTAL HIP ARTHROPLASTY ANTERIOR APPROACH;  Surgeon: Hessie Knows, MD;  Location: [facility];  Service: Orthopedics;  Laterality: Left;  . TOTAL KNEE ARTHROPLASTY Right   . VEIN LIGATION AND STRIPPING      Prior to Admission medications   Medication Sig Start Date End Date Taking? Authorizing Provider  acetaminophen (TYLENOL) 500 MG tablet Take 1,000 mg by mouth every 6 (six) hours as needed.    [provider]  albuterol (PROVENTIL HFA;VENTOLIN HFA) 108 (90 Base) MCG/ACT inhaler Inhale 2 puffs into the lungs every 6 (six) hours as needed for wheezing or shortness of breath. 01/28/17   Loletha Grayer, MD  alendronate (FOSAMAX) 70 MG tablet Take 70 mg by mouth every Thursday.     [provider]  ALPRAZolam Duanne Moron) 1 MG tablet Take 1 tablet (1 mg total) by mouth at bedtime as needed for anxiety. Patient taking differently: Take 0.5 mg by mouth at bedtime as needed for anxiety.  07/31/14   Elgergawy, Silver Huguenin, MD  amLODipine (NORVASC) 10 MG tablet Take 5 mg by mouth daily.     [provider]  apixaban (ELIQUIS) 5 MG TABS tablet Take 5 mg by mouth 2 (two) times daily.    [provider]  calcium carbonate (OSCAL) 1500 (600 Ca) MG TABS tablet Take 600 mg of elemental calcium by mouth daily with breakfast.    [provider]  carvedilol (COREG) 6.25 MG tablet Take 1 tablet (6.25 mg total) by mouth 2 (two) times daily with a meal. 01/28/17   Loletha Grayer, MD  furosemide (LASIX) 20 MG tablet TAKE ONE TABLET BY MOUTH ONCE DAILY 10/21/17   Darylene Price A, FNP  gabapentin (NEURONTIN) 100 MG capsule Take 200 mg by mouth 3 (three) times daily.     [provider]  HYDROcodone-acetaminophen (NORCO/VICODIN) 5-325 MG tablet Take 1-2 tablets by mouth every 4 (four) hours as needed for moderate pain (pain score 4-6). 02/11/18   Reche Dixon, PA-C  insulin aspart protamine- aspart (NOVOLOG  MIX 70/30) (70-30) 100 UNIT/ML injection Inject 0.15 mLs (15 Units total) into the skin 2 (two) times daily with a meal. 01/28/17   Leslye Peer, Richard, MD  insulin regular (NOVOLIN R,HUMULIN R) 100 units/mL injection Inject 10 Units into the skin daily with breakfast. If glucose less than 100 mg/dl pt does not take any; if glucose >100 mg/dl she takes Regular 10 units with breakfast    [provider]  levETIRAcetam (KEPPRA) 500 MG tablet Take 500 mg by mouth 2 (two) times daily.  03/25/17 02/09/18  [provider]  Menthol-Camphor (ICY HOT ARTHRITIS PAIN RELIEF) 16-4 % LOTN Apply 1 application topically 2 (two) times daily as needed. 06/03/17   Laban Emperor, PA-C  Multiple Vitamins-Minerals (PRESERVISION AREDS 2) CAPS Take 1 capsule by mouth daily.    [provider]  omeprazole (PRILOSEC) 20 MG capsule Take 20 mg by mouth daily.  [provider]  pravastatin (PRAVACHOL) 40 MG tablet Take 40 mg by mouth at bedtime.    [provider]  predniSONE (DELTASONE) 50 MG tablet Take 1 tablet (50 mg total) by mouth daily with breakfast. 09/01/18   Cuthriell, Charline Bills, PA-C  sacubitril-valsartan (ENTRESTO) 24-26 MG Take 1 tablet by mouth 2 (two) times daily.    [provider]  traMADol (ULTRAM) 50 MG tablet Take 1 tablet (50 mg total) by mouth every 6 (six) hours as needed. 02/11/18   Reche Dixon, PA-C  traMADol (ULTRAM) 50 MG tablet Take 1 tablet (50 mg total) by mouth every 6 (six) hours as needed for severe pain. 09/01/18   Cuthriell, Charline Bills, PA-C    Allergies Ambien [zolpidem tartrate]; Penicillins; Iodine; Succinylcholine; Etodolac; and Nsaids  Family History  Problem Relation Age of Onset  . Heart attack Mother   . Heart disease Father   . Alzheimer's disease Sister   . Cervical cancer Sister   . Heart failure Son     Social History Social History   Tobacco Use  . Smoking status: Never Smoker  . Smokeless tobacco: Never Used   Substance Use Topics  . Alcohol use: No  . Drug use: No     Review of Systems  Constitutional: No fever/chills Eyes: No visual changes. No discharge ENT: No upper respiratory complaints. Cardiovascular: no chest pain. Respiratory: no cough. No SOB. Gastrointestinal: No abdominal pain.  No nausea, no vomiting.  No diarrhea.  No constipation. Genitourinary: Negative for dysuria. No hematuria Musculoskeletal: Positive for right hip pain Skin: Negative for rash, abrasions, lacerations, ecchymosis. Neurological: Negative for headaches, focal weakness or numbness. 10-point ROS otherwise negative.  ____________________________________________   PHYSICAL EXAM:  VITAL SIGNS: ED Triage Vitals [09/01/18 1552]  Enc Vitals Group     BP 124/74     Pulse Rate 61     Resp 16     Temp 98.2 F (36.8 C)     Temp Source Oral     SpO2 97 %     Weight 160 lb 15 oz (73 kg)     Height 5\' 1"  (1.549 m)     Head Circumference      Peak Flow      Pain Score 8     Pain Loc      Pain Edu?      Excl. in [place]?      Constitutional: Alert and oriented. Well appearing and in no acute distress. Eyes: Conjunctivae are normal. PERRL. EOMI. Head: Atraumatic. Neck: No stridor.    Cardiovascular: Normal rate, regular rhythm. Normal S1 and S2.  Good peripheral circulation. Respiratory: Normal respiratory effort without tachypnea or retractions. Lungs CTAB. Good air entry to the bases with no decreased or absent breath sounds. Musculoskeletal: Full range of motion to all extremities. No gross deformities appreciated.  Visualization of the right hip reveals no visible signs of trauma with ecchymosis, abrasions or lacerations.  No edema or erythema.  Palpation reveals tenderness to palpation over greater trochanter region.  On palpation, palpable structure consistent with bony versus steel is appreciated to the lateral aspect of the hip.  No palpable deformity is appreciated.  Patient is able to ambulate on  the hip.  She is able to flex and extend as well as rotate the hip.  Examination of the lumbar spine and right knee is unremarkable.  Dorsalis pedis pulse intact distally.  Sensation intact distally. Neurologic:  Normal speech and language. No gross focal  neurologic deficits are appreciated.  Skin:  Skin is warm, dry and intact. No rash noted. Psychiatric: Mood and affect are normal. Speech and behavior are normal. Patient exhibits appropriate insight and judgement.   ____________________________________________   LABS (all labs ordered are listed, but only abnormal results are displayed)  Labs Reviewed - No data to display ____________________________________________  EKG   ____________________________________________  RADIOLOGY I personally viewed and evaluated these images as part of my medical decision making, as well as reviewing the written report by the radiologist.  Dg Hip Unilat W Or Wo Pelvis 2-3 Views Right  Result Date: 09/01/2018 CLINICAL DATA:  Right hip pain for 5 days EXAM: DG HIP (WITH OR WITHOUT PELVIS) 2-3V RIGHT COMPARISON:  CT pelvis from 02/08/2018 FINDINGS: Bilateral hip prostheses are present. Cerclage wire proximally along the right proximal femur. Vascular calcifications. Bony demineralization. Lower lumbar spondylosis and degenerative disc disease. No appreciable acute pelvic fracture. No acute periprosthetic fracture or other specific cause for the patient's right hip pain is identified. No lucency in the surrounding bone to suggest particulate disease. IMPRESSION: 1. No appreciable fracture or acute bony findings to explain the patient's recent right hip pain. Bilateral hip prosthesis are in place. Electronically Signed   By: Van Clines M.D.   On: 09/01/2018 17:36    ____________________________________________    PROCEDURES  Procedure(s) performed:    Procedures    Medications  predniSONE (DELTASONE) tablet 60 mg (has no administration in  time range)  traMADol (ULTRAM) tablet 50 mg (has no administration in time range)     ____________________________________________   INITIAL IMPRESSION / ASSESSMENT AND PLAN / ED COURSE  Pertinent labs & imaging results that were available during my care of the patient were reviewed by me and considered in my medical decision making (see chart for details).  Review of the [place] CSRS was performed in accordance of the [place] prior to dispensing any controlled drugs.      Patient's diagnosis is consistent with right hip pain.  Patient presents the emergency department complaining of nontraumatic right hip pain.  Patient does have a history of replacement to this hip approximately 14 years ago.  On exam, there were no acute findings.  Patient is still ambulatory.  X-ray reveals no hardware or osseous findings to explain patient's pain.  Likely this is inflammation of the area and so I will treat the patient with a short course of steroid, as well as a prescription for Ultram for pain.  I advised the patient to follow-up with orthopedics for further management of her pain complaint.  No indication for further work-up at this time.. Patient is given ED precautions to return to the ED for any worsening or new symptoms.     ____________________________________________  FINAL CLINICAL IMPRESSION(S) / ED DIAGNOSES  Final diagnoses:  Right hip pain      NEW MEDICATIONS STARTED DURING THIS VISIT:  ED Discharge Orders         Ordered    predniSONE (DELTASONE) 50 MG tablet  Daily with breakfast     09/01/18 1759    traMADol (ULTRAM) 50 MG tablet  Every 6 hours PRN     09/01/18 1759              This chart was dictated using voice recognition software/Dragon. Despite best efforts to proofread, errors can occur which can change the meaning. Any change was purely unintentional.    Darletta Moll, PA-C 09/01/18 1800    Nance Pear,  MD 09/01/18 1930

## 2018-09-01 NOTE — ED Triage Notes (Signed)
Hx of right hip replacement. Pt reporting since last week the right hip has started aching again. No trauma or injury reported. Pt ambulates with a cane but reports increased pain when ambulating.

## 2018-09-10 ENCOUNTER — Other Ambulatory Visit: Payer: Self-pay | Admitting: Orthopedic Surgery

## 2018-09-10 DIAGNOSIS — M5441 Lumbago with sciatica, right side: Principal | ICD-10-CM

## 2018-09-10 DIAGNOSIS — G8929 Other chronic pain: Secondary | ICD-10-CM

## 2018-09-16 ENCOUNTER — Ambulatory Visit: Payer: Medicare HMO | Attending: Orthopedic Surgery

## 2018-11-01 ENCOUNTER — Other Ambulatory Visit: Payer: Self-pay

## 2018-11-01 ENCOUNTER — Ambulatory Visit
Admission: RE | Admit: 2018-11-01 | Discharge: 2018-11-01 | Disposition: A | Discharge status: Home / Self Care | Payer: Medicare HMO | Source: Ambulatory Visit | Attending: Orthopedic Surgery | Admitting: Orthopedic Surgery

## 2018-11-01 DIAGNOSIS — M5441 Lumbago with sciatica, right side: Secondary | ICD-10-CM | POA: Insufficient documentation

## 2018-11-01 DIAGNOSIS — G8929 Other chronic pain: Secondary | ICD-10-CM | POA: Diagnosis present

## 2019-01-17 DIAGNOSIS — E785 Hyperlipidemia, unspecified: Secondary | ICD-10-CM | POA: Diagnosis not present

## 2019-01-17 DIAGNOSIS — J449 Chronic obstructive pulmonary disease, unspecified: Secondary | ICD-10-CM | POA: Diagnosis not present

## 2019-01-17 DIAGNOSIS — E1169 Type 2 diabetes mellitus with other specified complication: Secondary | ICD-10-CM | POA: Diagnosis not present

## 2019-01-17 DIAGNOSIS — I350 Nonrheumatic aortic (valve) stenosis: Secondary | ICD-10-CM | POA: Diagnosis not present

## 2019-01-17 DIAGNOSIS — I5022 Chronic systolic (congestive) heart failure: Secondary | ICD-10-CM | POA: Diagnosis not present

## 2019-01-17 DIAGNOSIS — I25810 Atherosclerosis of coronary artery bypass graft(s) without angina pectoris: Secondary | ICD-10-CM | POA: Diagnosis not present

## 2019-02-28 DIAGNOSIS — I10 Essential (primary) hypertension: Secondary | ICD-10-CM | POA: Diagnosis not present

## 2019-02-28 DIAGNOSIS — E1169 Type 2 diabetes mellitus with other specified complication: Secondary | ICD-10-CM | POA: Diagnosis not present

## 2019-02-28 DIAGNOSIS — Z794 Long term (current) use of insulin: Secondary | ICD-10-CM | POA: Diagnosis not present

## 2019-02-28 DIAGNOSIS — E1159 Type 2 diabetes mellitus with other circulatory complications: Secondary | ICD-10-CM | POA: Diagnosis not present

## 2019-02-28 DIAGNOSIS — Z79899 Other long term (current) drug therapy: Secondary | ICD-10-CM | POA: Diagnosis not present

## 2019-02-28 DIAGNOSIS — Z0000 Encounter for general adult medical examination without abnormal findings: Secondary | ICD-10-CM | POA: Diagnosis not present

## 2019-02-28 DIAGNOSIS — I5022 Chronic systolic (congestive) heart failure: Secondary | ICD-10-CM | POA: Diagnosis not present

## 2019-02-28 DIAGNOSIS — E1142 Type 2 diabetes mellitus with diabetic polyneuropathy: Secondary | ICD-10-CM | POA: Diagnosis not present

## 2019-02-28 DIAGNOSIS — E785 Hyperlipidemia, unspecified: Secondary | ICD-10-CM | POA: Diagnosis not present

## 2019-07-22 DIAGNOSIS — E1169 Type 2 diabetes mellitus with other specified complication: Secondary | ICD-10-CM | POA: Diagnosis not present

## 2019-07-22 DIAGNOSIS — I10 Essential (primary) hypertension: Secondary | ICD-10-CM | POA: Diagnosis not present

## 2019-07-22 DIAGNOSIS — E785 Hyperlipidemia, unspecified: Secondary | ICD-10-CM | POA: Diagnosis not present

## 2019-07-22 DIAGNOSIS — E1142 Type 2 diabetes mellitus with diabetic polyneuropathy: Secondary | ICD-10-CM | POA: Diagnosis not present

## 2019-07-22 DIAGNOSIS — Z23 Encounter for immunization: Secondary | ICD-10-CM | POA: Diagnosis not present

## 2019-07-22 DIAGNOSIS — M810 Age-related osteoporosis without current pathological fracture: Secondary | ICD-10-CM | POA: Diagnosis not present

## 2019-07-22 DIAGNOSIS — Z794 Long term (current) use of insulin: Secondary | ICD-10-CM | POA: Diagnosis not present

## 2019-07-22 DIAGNOSIS — E1159 Type 2 diabetes mellitus with other circulatory complications: Secondary | ICD-10-CM | POA: Diagnosis not present

## 2019-08-01 DIAGNOSIS — M7989 Other specified soft tissue disorders: Secondary | ICD-10-CM | POA: Diagnosis not present

## 2019-08-01 DIAGNOSIS — I10 Essential (primary) hypertension: Secondary | ICD-10-CM | POA: Diagnosis not present

## 2019-08-01 DIAGNOSIS — E785 Hyperlipidemia, unspecified: Secondary | ICD-10-CM | POA: Diagnosis not present

## 2019-08-01 DIAGNOSIS — Z0000 Encounter for general adult medical examination without abnormal findings: Secondary | ICD-10-CM | POA: Diagnosis not present

## 2019-08-01 DIAGNOSIS — Z79899 Other long term (current) drug therapy: Secondary | ICD-10-CM | POA: Diagnosis not present

## 2019-08-01 DIAGNOSIS — Z794 Long term (current) use of insulin: Secondary | ICD-10-CM | POA: Diagnosis not present

## 2019-08-01 DIAGNOSIS — I2510 Atherosclerotic heart disease of native coronary artery without angina pectoris: Secondary | ICD-10-CM | POA: Diagnosis not present

## 2019-08-01 DIAGNOSIS — M25512 Pain in left shoulder: Secondary | ICD-10-CM | POA: Diagnosis not present

## 2019-08-01 DIAGNOSIS — E119 Type 2 diabetes mellitus without complications: Secondary | ICD-10-CM | POA: Diagnosis not present

## 2019-08-31 ENCOUNTER — Ambulatory Visit
Admission: EM | Admit: 2019-08-31 | Discharge: 2019-08-31 | Disposition: A | Discharge status: Home / Self Care | Payer: Medicare HMO | Attending: Urgent Care | Admitting: Urgent Care

## 2019-08-31 ENCOUNTER — Other Ambulatory Visit: Payer: Self-pay

## 2019-08-31 ENCOUNTER — Encounter: Payer: Self-pay | Admitting: Emergency Medicine

## 2019-08-31 ENCOUNTER — Ambulatory Visit (INDEPENDENT_AMBULATORY_CARE_PROVIDER_SITE_OTHER): Payer: Medicare HMO

## 2019-08-31 DIAGNOSIS — M79622 Pain in left upper arm: Secondary | ICD-10-CM

## 2019-08-31 DIAGNOSIS — M25512 Pain in left shoulder: Secondary | ICD-10-CM

## 2019-08-31 DIAGNOSIS — R296 Repeated falls: Secondary | ICD-10-CM

## 2019-08-31 DIAGNOSIS — M75102 Unspecified rotator cuff tear or rupture of left shoulder, not specified as traumatic: Secondary | ICD-10-CM

## 2019-08-31 DIAGNOSIS — S4992XA Unspecified injury of left shoulder and upper arm, initial encounter: Secondary | ICD-10-CM | POA: Diagnosis not present

## 2019-08-31 MED ORDER — HYDROCODONE-ACETAMINOPHEN 5-325 MG PO TABS: 1.0000 | ORAL_TABLET | Freq: Two times a day (BID) | ORAL | 0 refills | Status: DC | PRN

## 2019-08-31 NOTE — ED Triage Notes (Signed)
Patient c/o falling yesterday and hit her left shoulder on the bed rail. She states she has had left side shoulder surgery and left arm surgery in the past. She is c/o left shoulder pain.

## 2019-08-31 NOTE — Discharge Instructions (Addendum)
It was very nice seeing you today in clinic. Thank you for entrusting me with your care.   Tylenol and Ibuprofen as needed. Wear sling. Apply ice to help with pain. Will send something a little stronger for pain. You have had it in the past. Be careful as it can make you sleepy and more prone to fall.   Make arrangements to follow up with orthopedic doctor in 1 week for re-evaluation if not improving. If your symptoms/condition worsens, please seek follow up care either here or in the ER. Please remember, our [facility] providers are "right here with you" when you need [place].   Again, it was my pleasure to take care of you today. Thank you for choosing our clinic. I hope that you start to feel better quickly.   Honor Loh, MSN, APRN, FNP-C, CEN Advanced Practice Provider [facility]

## 2019-09-02 NOTE — ED Provider Notes (Signed)
[place], [place]   Name: Amber [name] DOB: 1930/01/12 MRN: [id number] CSN: [id number] PCP: Idelle Crouch, MD  Arrival date and time:  08/31/19 1738  Chief Complaint:  Shoulder Pain   NOTE: Prior to seeing the patient today, I have reviewed the triage nursing documentation and vital signs. Clinical staff has updated patient's PMH/PSHx, current medication list, and drug allergies/intolerances to ensure comprehensive history available to assist in medical decision making.   History:   HPI: Amber [name] is a 86 y.o. female who presents today with complaints of pain in her LEFT shoulder following a mechanical fall that occurred yesterday.  Patient has neuropathy in her feet, which causes difficulties with ambulation.  Patient reports that she had gotten out of bed to go to the bathroom when she "lost her balance" and fell.  When patient fell to the floor, she notes that she hit her LEFT shoulder on bed rail.  Of note, patient has had significant reconstructive surgery to her LEFT humerus per her report.  Patient reporting decreased range of motion overall in her shoulder secondary to the pain.  She denies any weakness or distal paresthesias in her LEFT upper extremity.  In efforts to conservatively manage her pain at home, patient has been applying ice, which has not helped.  Additionally, patient is on tramadol and gabapentin chronically.  She notes that these interventions are also not helping with her pain.  Past Medical History:  Diagnosis Date  . Anemia   . Anxiety   . Arthritis   . Benign neoplasm of colon   . CHF (congestive heart failure) ([place])   . Chronic kidney disease   . Chronic pain   . Complication of anesthesia 1980's   hard time waking up  . Coronary artery disease with unspecified angina pectoris   . Cough   . Diabetes mellitus without complication ([place])   . Essential hypertension   . High risk medication use   . Hyperlipidemia   . Plantar fascial  fibromatosis   . Presence of permanent cardiac pacemaker   . Shortness of breath dyspnea     Past Surgical History:  Procedure Laterality Date  . ABDOMINAL HYSTERECTOMY    . BACK SURGERY  1960's   cage and screws in lower back  . CATARACT EXTRACTION W/ INTRAOCULAR LENS  IMPLANT, BILATERAL Bilateral   . CORONARY ARTERY BYPASS GRAFT  2008   triple  . JOINT REPLACEMENT Right    hip and knee  . ORIF ANKLE FRACTURE Right 07/27/2014   Procedure: OPEN REDUCTION INTERNAL FIXATION (ORIF) ANKLE FRACTURE;  Surgeon: Alta Corning, MD;  Location: [facility];  Service: Orthopedics;  Laterality: Right;  . PACEMAKER INSERTION Left 03/14/2015   Procedure: INSERTION PACEMAKER;  Surgeon: Isaias Cowman, MD;  Location: [facility];  Service: Cardiovascular;  Laterality: Left;  . ROTATOR CUFF REPAIR Left   . TOTAL HIP ARTHROPLASTY Right   . TOTAL HIP ARTHROPLASTY Left 02/09/2018   Procedure: TOTAL HIP ARTHROPLASTY ANTERIOR APPROACH;  Surgeon: Hessie Knows, MD;  Location: [facility];  Service: Orthopedics;  Laterality: Left;  . TOTAL KNEE ARTHROPLASTY Right   . VEIN LIGATION AND STRIPPING      Family History  Problem Relation Age of Onset  . Heart attack Mother   . Heart disease Father   . Alzheimer's disease Sister   . Cervical cancer Sister   . Heart failure Son     Social History   Tobacco Use  . Smoking status:  Never Smoker  . Smokeless tobacco: Never Used  Substance Use Topics  . Alcohol use: No  . Drug use: No    Patient Active Problem List   Diagnosis Date Noted  . Status post total hip replacement, left 02/09/2018  . Primary osteoarthritis of left hip 01/06/2018  . Chronic pain syndrome 04/09/2017  . Other specified health status 04/08/2017  . Disorder of bone, unspecified 04/08/2017  . Long term current use of opiate analgesic 04/08/2017  . Chronic low back pain (Primary Area of Pain) (Bilateral) (L>R) 04/08/2017  . Chronic pain of both lower extremities  (Tertiary Area of Pain)  04/08/2017  . Chronic groin pain (Secondary Area of Pain) 04/08/2017  . Chronic neck pain (Fourth Area of Pain) (Bilateral) (L>R) 04/08/2017  . Chronic sacroiliac joint pain 04/08/2017  . Other long term (current) drug therapy 04/08/2017  . Syncope 02/09/2017  . Asthmatic bronchitis 01/26/2017  . Fall 10/06/2016  . Anxiety 08/08/2016  . TIA (transient ischemic attack) 07/25/2015  . Sick sinus syndrome ([place]) 03/14/2015  . Chronic heart failure ([place]) 01/05/2015  . Fracture dislocation of ankle 07/27/2014  . IDDM (insulin dependent diabetes mellitus) 07/27/2014  . Essential hypertension 07/27/2014  . Multiple rib fractures 07/27/2014  . 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 Anemia 12/06/2013    Home Medications:    Current Meds  Medication Sig  . acetaminophen (TYLENOL) 500 MG tablet Take 1,000 mg by mouth every 6 (six) hours as needed.  Marland Kitchen albuterol (PROVENTIL HFA;VENTOLIN HFA) 108 (90 Base) MCG/ACT inhaler Inhale 2 puffs into the lungs every 6 (six) hours as needed for wheezing or shortness of breath.  Marland Kitchen alendronate (FOSAMAX) 70 MG tablet Take 70 mg by mouth every Thursday.   . ALPRAZolam (XANAX) 1 MG tablet Take 1 tablet (1 mg total) by mouth at bedtime as needed for anxiety. (Patient taking differently: Take 0.5 mg by mouth at bedtime as needed for anxiety. )  . amLODipine (NORVASC) 10 MG tablet Take 5 mg by mouth daily.   Marland Kitchen apixaban (ELIQUIS) 5 MG TABS tablet Take 5 mg by mouth 2 (two) times daily.  . calcium carbonate (OSCAL) 1500 (600 Ca) MG TABS tablet Take 600 mg of elemental calcium by mouth daily with breakfast.  . carvedilol (COREG) 6.25 MG tablet Take 1 tablet (6.25 mg total) by mouth 2 (two) times daily with a meal.  . furosemide (LASIX) 20 MG tablet TAKE ONE TABLET BY MOUTH ONCE DAILY  . gabapentin (NEURONTIN) 100 MG capsule Take 200 mg by mouth 3 (three) times daily.   . insulin aspart protamine- aspart (NOVOLOG MIX 70/30) (70-30) 100 UNIT/ML injection Inject 0.15 mLs (15  Units total) into the skin 2 (two) times daily with a meal.  . insulin regular (NOVOLIN R,HUMULIN R) 100 units/mL injection Inject 10 Units into the skin daily with breakfast. If glucose less than 100 mg/dl pt does not take any; if glucose >100 mg/dl she takes Regular 10 units with breakfast  . Multiple Vitamins-Minerals (PRESERVISION AREDS 2) CAPS Take 1 capsule by mouth daily.  Marland Kitchen omeprazole (PRILOSEC) 20 MG capsule Take 20 mg by mouth daily.  . pravastatin (PRAVACHOL) 40 MG tablet Take 40 mg by mouth at bedtime.  . sacubitril-valsartan (ENTRESTO) 24-26 MG Take 1 tablet by mouth 2 (two) times daily.  . traMADol (ULTRAM) 50 MG tablet Take 1 tablet (50 mg total) by mouth every 6 (six) hours as needed.  . [DISCONTINUED] HYDROcodone-acetaminophen (NORCO/VICODIN) 5-325 MG tablet Take 1-2 tablets by mouth  every 4 (four) hours as needed for moderate pain (pain score 4-6).    Allergies:   Ambien [zolpidem tartrate], Penicillins, Iodine, Succinylcholine, Etodolac, and Nsaids  Review of Systems (ROS):  Review of systems NEGATIVE unless otherwise noted in narrative H&P section.   Vital Signs: Today's Vitals   08/31/19 1822 08/31/19 1823 08/31/19 1825 08/31/19 1942  BP:   115/61   Pulse:   65   Resp:   20   Temp:   98.2 F (36.8 C)   TempSrc:   Oral   SpO2:   96%   Height:  5' (1.524 m)    PainSc: 8    8     Physical Exam: Physical Exam  Constitutional: She is oriented to person, place, and time and well-developed, well-nourished, and in no distress.  HENT:  Head: Normocephalic and atraumatic.  Eyes: Pupils are equal, round, and reactive to light.  Cardiovascular: Normal rate, regular rhythm, normal heart sounds and intact distal pulses.  Pulmonary/Chest: Effort normal and breath sounds normal.  Musculoskeletal:     Left shoulder: Tenderness and pain present. No swelling, deformity, effusion or crepitus. Decreased range of motion. Normal strength. Normal pulse.  Neurological: She is  alert and oriented to person, place, and time. Gait normal.  Skin: Skin is warm and dry. No rash noted. She is not diaphoretic.  Psychiatric: Memory, affect and judgment normal. Her mood appears anxious.  Nursing note and vitals reviewed.   Urgent Care Treatments / Results:   Orders Placed This Encounter  Procedures  . DG Shoulder Left  . DG Humerus Left    LABS: PLEASE NOTE: all labs that were ordered this encounter are listed, however only abnormal results are displayed. Labs Reviewed - No data to display  EKG: -None  RADIOLOGY: DG Shoulder Left  Result Date: 08/31/2019 CLINICAL DATA:  Left shoulder and upper arm pain since falling yesterday. Remote surgery. EXAM: LEFT SHOULDER - 2+ VIEW; LEFT HUMERUS - 2+ VIEW COMPARISON:  Left shoulder radiographs 11/04/2012 FINDINGS: Status post left total shoulder arthroplasty. The hardware is intact without loosening. There is superior migration of the humeral head and resulting narrowing of the subacromial space. Mid to distal posterior humeral plate and screws are unchanged. Some of the screws do not extend into the bone, although the appearance is unchanged. No evidence of acute fracture or dislocation. Left subclavian pacemaker and old left-sided rib fractures are noted. IMPRESSION: No acute findings status post left total shoulder arthroplasty and distal humeral ORIF. Superior migration of the humeral head with narrowing of the subacromial space suggesting chronic rotator cuff tear. Electronically Signed   By: Richardean Sale M.D.   On: 08/31/2019 19:21   DG Humerus Left  Result Date: 08/31/2019 CLINICAL DATA:  Left shoulder and upper arm pain since falling yesterday. Remote surgery. EXAM: LEFT SHOULDER - 2+ VIEW; LEFT HUMERUS - 2+ VIEW COMPARISON:  Left shoulder radiographs 11/04/2012 FINDINGS: Status post left total shoulder arthroplasty. The hardware is intact without loosening. There is superior migration of the humeral head and resulting  narrowing of the subacromial space. Mid to distal posterior humeral plate and screws are unchanged. Some of the screws do not extend into the bone, although the appearance is unchanged. No evidence of acute fracture or dislocation. Left subclavian pacemaker and old left-sided rib fractures are noted. IMPRESSION: No acute findings status post left total shoulder arthroplasty and distal humeral ORIF. Superior migration of the humeral head with narrowing of the subacromial space suggesting chronic rotator  cuff tear. Electronically Signed   By: Richardean Sale M.D.   On: 08/31/2019 19:21    PROCEDURES: Procedures  MEDICATIONS RECEIVED THIS VISIT: Medications - No data to display  PERTINENT CLINICAL COURSE NOTES/UPDATES:   Initial Impression / Assessment and Plan / Urgent Care Course:  Pertinent labs & imaging results that were available during my care of the patient were personally reviewed by me and considered in my medical decision making (see lab/imaging section of note for values and interpretations).  Amber [name] is a 86 y.o. female who presents to [facility] today with complaints of Shoulder Pain  Patient is well appearing overall in clinic today. She does not appear to be in any acute distress. Presenting symptoms (see HPI) and exam as documented above.  Patient presents with LEFT shoulder and upper arm pain following a mechanical fall that occurred yesterday.  Patient reporting extensive orthopedic reconstruction in her arm following previous fracture.  She is unable to advise exactly where her pain is located.  Will obtain diagnostic imaging of the shoulder to further assess.  Given the fact that the patient has orthopedic hardware in place, will obtain imaging of the humerus as well to assess for stability.   Diagnostic plain films of the LEFT shoulder and humerus reveal no acute findings.  Patient is s/p total left shoulder arthroplasty and distal humeral ORIF.  Imaging  reveals migration of the humeral head with narrowing of the subacromial space which is suggestive of a chronic rotator cuff tear.   Discussed findings with patient.  She was offered a sling in clinic, however she declined citing that she had multiple at home and would use those. Discussed with patient that she needs to be seen for further evaluation by orthopedics. Patient advising has been seen by orthopedics at [facility] in the past. Advised to call them tomorrow to discussed further evaluation of her shoulder given today's findings. Patient to continue current interventions at home. Discussed rest and ice as complimentary approaches to improving her pain. Will provide patient with a short course of Norco 5/325 mg tablets for PRN use. Dicussed indications and safety with patient. She indicates that she has had this medication in th past ans has done fine.   Discussed follow up with primary care physician in 1 week for re-evaluation. I have reviewed the follow up and strict return precautions for any new or worsening symptoms. Patient is aware of symptoms that would be deemed urgent/emergent, and would thus require further evaluation either here or in the emergency department. At the time of discharge, she verbalized understanding and consent with the discharge plan as it was reviewed with her. All questions were fielded by provider and/or clinic staff prior to patient discharge.    Final Clinical Impressions / Urgent Care Diagnoses:   Final diagnoses:  Acute pain of left shoulder  Fall in elderly patient  Tear of left rotator cuff, unspecified tear extent, unspecified whether traumatic    New Prescriptions:  [place] Controlled Substance Registry consulted? Yes, I have consulted the [place] Controlled Substances Registry for this patient, and feel the risk/benefit ratio today is favorable for proceeding with this prescription for a controlled substance.  . Discussed use of controlled substance  medication to treat her acute pain.  o Reviewed  STOP Act regulations  o Clinic does not refill controlled substances over the phone without face to face evaluation.  . Safety precautions reviewed.  o Medications should not be bitten, chewed, sold,  or taken with alcohol.  o Avoid use while working, driving, or operating heavy machinery.  o Side effects associated with the use of this particular medication reviewed. - Patient understands that this medication can cause CNS depression, increase her risk of falls, and even lead to overdose that may result in death, if used outside of the parameters that she and I discussed.  With all of this in mind, she knowingly accepts the risks and responsibilities associated with intended course of treatment, and elects to responsibly proceed as discussed.  Meds ordered this encounter  Medications  . HYDROcodone-acetaminophen (NORCO/VICODIN) 5-325 MG tablet    Sig: Take 1 tablet by mouth 2 (two) times daily as needed for moderate pain (pain score 4-6).    Dispense:  10 tablet    Refill:  0    Recommended Follow up Care:  Patient encouraged to follow up with the following provider within the specified time frame, or sooner as dictated by the severity of her symptoms. As always, she was instructed that for any urgent/emergent care needs, she should seek care either here or in the emergency department for more immediate evaluation.  Follow-up Information    Hessie Knows, MD In 1 week.   Specialty: Orthopedic Surgery Contact information: [place] [id number] [phone]         NOTE: This note was prepared using Dragon dictation software along with smaller phrase technology. Despite my best ability to proofread, there is the potential that transcriptional errors may still occur from this process, and are completely unintentional.    Karen Kitchens, NP 09/02/19 1159

## 2019-09-21 DIAGNOSIS — I10 Essential (primary) hypertension: Secondary | ICD-10-CM | POA: Diagnosis not present

## 2019-09-21 DIAGNOSIS — E1159 Type 2 diabetes mellitus with other circulatory complications: Secondary | ICD-10-CM | POA: Diagnosis not present

## 2019-09-21 DIAGNOSIS — I5022 Chronic systolic (congestive) heart failure: Secondary | ICD-10-CM | POA: Diagnosis not present

## 2019-09-21 DIAGNOSIS — E1169 Type 2 diabetes mellitus with other specified complication: Secondary | ICD-10-CM | POA: Diagnosis not present

## 2019-09-21 DIAGNOSIS — E785 Hyperlipidemia, unspecified: Secondary | ICD-10-CM | POA: Diagnosis not present

## 2019-09-21 DIAGNOSIS — I25810 Atherosclerosis of coronary artery bypass graft(s) without angina pectoris: Secondary | ICD-10-CM | POA: Diagnosis not present

## 2019-10-17 DIAGNOSIS — E1169 Type 2 diabetes mellitus with other specified complication: Secondary | ICD-10-CM | POA: Diagnosis not present

## 2019-10-17 DIAGNOSIS — E785 Hyperlipidemia, unspecified: Secondary | ICD-10-CM | POA: Diagnosis not present

## 2019-10-17 DIAGNOSIS — E1142 Type 2 diabetes mellitus with diabetic polyneuropathy: Secondary | ICD-10-CM | POA: Diagnosis not present

## 2019-10-17 DIAGNOSIS — E1159 Type 2 diabetes mellitus with other circulatory complications: Secondary | ICD-10-CM | POA: Diagnosis not present

## 2019-10-17 DIAGNOSIS — Z794 Long term (current) use of insulin: Secondary | ICD-10-CM | POA: Diagnosis not present

## 2019-10-17 DIAGNOSIS — Z79899 Other long term (current) drug therapy: Secondary | ICD-10-CM | POA: Diagnosis not present

## 2019-10-17 DIAGNOSIS — I10 Essential (primary) hypertension: Secondary | ICD-10-CM | POA: Diagnosis not present

## 2019-11-02 DIAGNOSIS — I25118 Atherosclerotic heart disease of native coronary artery with other forms of angina pectoris: Secondary | ICD-10-CM | POA: Diagnosis not present

## 2019-11-02 DIAGNOSIS — Z6835 Body mass index (BMI) 35.0-35.9, adult: Secondary | ICD-10-CM | POA: Diagnosis not present

## 2019-11-02 DIAGNOSIS — Z950 Presence of cardiac pacemaker: Secondary | ICD-10-CM | POA: Diagnosis not present

## 2019-11-02 DIAGNOSIS — I480 Paroxysmal atrial fibrillation: Secondary | ICD-10-CM | POA: Diagnosis not present

## 2019-11-02 DIAGNOSIS — E1165 Type 2 diabetes mellitus with hyperglycemia: Secondary | ICD-10-CM | POA: Diagnosis not present

## 2019-11-02 DIAGNOSIS — D692 Other nonthrombocytopenic purpura: Secondary | ICD-10-CM | POA: Diagnosis not present

## 2019-11-02 DIAGNOSIS — Z794 Long term (current) use of insulin: Secondary | ICD-10-CM | POA: Diagnosis not present

## 2019-11-02 DIAGNOSIS — D6869 Other thrombophilia: Secondary | ICD-10-CM | POA: Diagnosis not present

## 2019-11-16 DIAGNOSIS — I152 Hypertension secondary to endocrine disorders: Secondary | ICD-10-CM | POA: Diagnosis not present

## 2019-11-16 DIAGNOSIS — E1142 Type 2 diabetes mellitus with diabetic polyneuropathy: Secondary | ICD-10-CM | POA: Diagnosis not present

## 2019-11-16 DIAGNOSIS — E1169 Type 2 diabetes mellitus with other specified complication: Secondary | ICD-10-CM | POA: Diagnosis not present

## 2019-11-16 DIAGNOSIS — G894 Chronic pain syndrome: Secondary | ICD-10-CM | POA: Diagnosis not present

## 2019-11-16 DIAGNOSIS — Z794 Long term (current) use of insulin: Secondary | ICD-10-CM | POA: Diagnosis not present

## 2019-11-16 DIAGNOSIS — I25810 Atherosclerosis of coronary artery bypass graft(s) without angina pectoris: Secondary | ICD-10-CM | POA: Diagnosis not present

## 2019-11-16 DIAGNOSIS — E1159 Type 2 diabetes mellitus with other circulatory complications: Secondary | ICD-10-CM | POA: Diagnosis not present

## 2019-11-16 DIAGNOSIS — E785 Hyperlipidemia, unspecified: Secondary | ICD-10-CM | POA: Diagnosis not present

## 2019-11-16 DIAGNOSIS — M545 Low back pain: Secondary | ICD-10-CM | POA: Diagnosis not present

## 2019-11-22 DIAGNOSIS — I495 Sick sinus syndrome: Secondary | ICD-10-CM | POA: Diagnosis not present

## 2019-12-06 ENCOUNTER — Inpatient Hospital Stay
Admit: 2019-12-06 | Discharge: 2019-12-06 | Disposition: A | Discharge status: Home / Self Care | Payer: Medicare HMO | Attending: Family Medicine | Admitting: Family Medicine

## 2019-12-06 ENCOUNTER — Other Ambulatory Visit: Payer: Self-pay

## 2019-12-06 ENCOUNTER — Inpatient Hospital Stay
Admission: EM | Admit: 2019-12-06 | Discharge: 2019-12-14 | DRG: 493 | Disposition: A | Discharge status: Skilled Nursing Facility | Payer: Medicare HMO | Attending: Internal Medicine | Admitting: Internal Medicine

## 2019-12-06 ENCOUNTER — Emergency Department: Payer: Medicare HMO

## 2019-12-06 DIAGNOSIS — S82201A Unspecified fracture of shaft of right tibia, initial encounter for closed fracture: Secondary | ICD-10-CM | POA: Diagnosis present

## 2019-12-06 DIAGNOSIS — Z8049 Family history of malignant neoplasm of other genital organs: Secondary | ICD-10-CM

## 2019-12-06 DIAGNOSIS — S82151A Displaced fracture of right tibial tuberosity, initial encounter for closed fracture: Principal | ICD-10-CM

## 2019-12-06 DIAGNOSIS — Z7901 Long term (current) use of anticoagulants: Secondary | ICD-10-CM

## 2019-12-06 DIAGNOSIS — E861 Hypovolemia: Secondary | ICD-10-CM

## 2019-12-06 DIAGNOSIS — Z4789 Encounter for other orthopedic aftercare: Secondary | ICD-10-CM | POA: Diagnosis not present

## 2019-12-06 DIAGNOSIS — R488 Other symbolic dysfunctions: Secondary | ICD-10-CM | POA: Diagnosis not present

## 2019-12-06 DIAGNOSIS — R609 Edema, unspecified: Secondary | ICD-10-CM | POA: Diagnosis not present

## 2019-12-06 DIAGNOSIS — I130 Hypertensive heart and chronic kidney disease with heart failure and stage 1 through stage 4 chronic kidney disease, or unspecified chronic kidney disease: Secondary | ICD-10-CM | POA: Diagnosis not present

## 2019-12-06 DIAGNOSIS — Z79899 Other long term (current) drug therapy: Secondary | ICD-10-CM

## 2019-12-06 DIAGNOSIS — E1122 Type 2 diabetes mellitus with diabetic chronic kidney disease: Secondary | ICD-10-CM | POA: Diagnosis present

## 2019-12-06 DIAGNOSIS — Z888 Allergy status to other drugs, medicaments and biological substances status: Secondary | ICD-10-CM | POA: Diagnosis not present

## 2019-12-06 DIAGNOSIS — I110 Hypertensive heart disease with heart failure: Secondary | ICD-10-CM | POA: Diagnosis not present

## 2019-12-06 DIAGNOSIS — Z91041 Radiographic dye allergy status: Secondary | ICD-10-CM

## 2019-12-06 DIAGNOSIS — Z794 Long term (current) use of insulin: Secondary | ICD-10-CM

## 2019-12-06 DIAGNOSIS — Z01810 Encounter for preprocedural cardiovascular examination: Secondary | ICD-10-CM | POA: Diagnosis not present

## 2019-12-06 DIAGNOSIS — Z8673 Personal history of transient ischemic attack (TIA), and cerebral infarction without residual deficits: Secondary | ICD-10-CM | POA: Diagnosis not present

## 2019-12-06 DIAGNOSIS — I495 Sick sinus syndrome: Secondary | ICD-10-CM | POA: Diagnosis present

## 2019-12-06 DIAGNOSIS — Z8781 Personal history of (healed) traumatic fracture: Secondary | ICD-10-CM

## 2019-12-06 DIAGNOSIS — E119 Type 2 diabetes mellitus without complications: Secondary | ICD-10-CM | POA: Diagnosis not present

## 2019-12-06 DIAGNOSIS — D649 Anemia, unspecified: Secondary | ICD-10-CM | POA: Diagnosis present

## 2019-12-06 DIAGNOSIS — S82241A Displaced spiral fracture of shaft of right tibia, initial encounter for closed fracture: Secondary | ICD-10-CM | POA: Diagnosis not present

## 2019-12-06 DIAGNOSIS — Z419 Encounter for procedure for purposes other than remedying health state, unspecified: Secondary | ICD-10-CM

## 2019-12-06 DIAGNOSIS — Z472 Encounter for removal of internal fixation device: Secondary | ICD-10-CM | POA: Diagnosis not present

## 2019-12-06 DIAGNOSIS — Z20822 Contact with and (suspected) exposure to covid-19: Secondary | ICD-10-CM | POA: Diagnosis not present

## 2019-12-06 DIAGNOSIS — F445 Conversion disorder with seizures or convulsions: Secondary | ICD-10-CM | POA: Diagnosis not present

## 2019-12-06 DIAGNOSIS — M1990 Unspecified osteoarthritis, unspecified site: Secondary | ICD-10-CM | POA: Diagnosis not present

## 2019-12-06 DIAGNOSIS — D508 Other iron deficiency anemias: Secondary | ICD-10-CM | POA: Diagnosis not present

## 2019-12-06 DIAGNOSIS — R55 Syncope and collapse: Secondary | ICD-10-CM | POA: Diagnosis not present

## 2019-12-06 DIAGNOSIS — I480 Paroxysmal atrial fibrillation: Secondary | ICD-10-CM | POA: Diagnosis not present

## 2019-12-06 DIAGNOSIS — I5022 Chronic systolic (congestive) heart failure: Secondary | ICD-10-CM | POA: Diagnosis not present

## 2019-12-06 DIAGNOSIS — E669 Obesity, unspecified: Secondary | ICD-10-CM | POA: Diagnosis present

## 2019-12-06 DIAGNOSIS — S82391D Other fracture of lower end of right tibia, subsequent encounter for closed fracture with routine healing: Secondary | ICD-10-CM | POA: Diagnosis not present

## 2019-12-06 DIAGNOSIS — E785 Hyperlipidemia, unspecified: Secondary | ICD-10-CM | POA: Diagnosis not present

## 2019-12-06 DIAGNOSIS — M722 Plantar fascial fibromatosis: Secondary | ICD-10-CM | POA: Diagnosis present

## 2019-12-06 DIAGNOSIS — Z8249 Family history of ischemic heart disease and other diseases of the circulatory system: Secondary | ICD-10-CM

## 2019-12-06 DIAGNOSIS — F419 Anxiety disorder, unspecified: Secondary | ICD-10-CM | POA: Diagnosis not present

## 2019-12-06 DIAGNOSIS — I959 Hypotension, unspecified: Secondary | ICD-10-CM | POA: Diagnosis not present

## 2019-12-06 DIAGNOSIS — Z880 Allergy status to penicillin: Secondary | ICD-10-CM | POA: Diagnosis not present

## 2019-12-06 DIAGNOSIS — W19XXXA Unspecified fall, initial encounter: Secondary | ICD-10-CM | POA: Diagnosis present

## 2019-12-06 DIAGNOSIS — S82151D Displaced fracture of right tibial tuberosity, subsequent encounter for closed fracture with routine healing: Secondary | ICD-10-CM | POA: Diagnosis not present

## 2019-12-06 DIAGNOSIS — Z86010 Personal history of colonic polyps: Secondary | ICD-10-CM | POA: Diagnosis not present

## 2019-12-06 DIAGNOSIS — S82201D Unspecified fracture of shaft of right tibia, subsequent encounter for closed fracture with routine healing: Secondary | ICD-10-CM | POA: Diagnosis not present

## 2019-12-06 DIAGNOSIS — G8929 Other chronic pain: Secondary | ICD-10-CM | POA: Diagnosis present

## 2019-12-06 DIAGNOSIS — Z9889 Other specified postprocedural states: Secondary | ICD-10-CM | POA: Diagnosis not present

## 2019-12-06 DIAGNOSIS — M2550 Pain in unspecified joint: Secondary | ICD-10-CM | POA: Diagnosis not present

## 2019-12-06 DIAGNOSIS — I25110 Atherosclerotic heart disease of native coronary artery with unstable angina pectoris: Secondary | ICD-10-CM | POA: Diagnosis not present

## 2019-12-06 DIAGNOSIS — Z951 Presence of aortocoronary bypass graft: Secondary | ICD-10-CM

## 2019-12-06 DIAGNOSIS — Z7401 Bed confinement status: Secondary | ICD-10-CM | POA: Diagnosis not present

## 2019-12-06 DIAGNOSIS — Z03818 Encounter for observation for suspected exposure to other biological agents ruled out: Secondary | ICD-10-CM | POA: Diagnosis not present

## 2019-12-06 DIAGNOSIS — S82441D Displaced spiral fracture of shaft of right fibula, subsequent encounter for closed fracture with routine healing: Secondary | ICD-10-CM | POA: Diagnosis not present

## 2019-12-06 DIAGNOSIS — Z96643 Presence of artificial hip joint, bilateral: Secondary | ICD-10-CM | POA: Diagnosis present

## 2019-12-06 DIAGNOSIS — I509 Heart failure, unspecified: Secondary | ICD-10-CM | POA: Diagnosis not present

## 2019-12-06 DIAGNOSIS — I350 Nonrheumatic aortic (valve) stenosis: Secondary | ICD-10-CM | POA: Diagnosis present

## 2019-12-06 DIAGNOSIS — M6281 Muscle weakness (generalized): Secondary | ICD-10-CM | POA: Diagnosis not present

## 2019-12-06 DIAGNOSIS — T1490XA Injury, unspecified, initial encounter: Secondary | ICD-10-CM

## 2019-12-06 DIAGNOSIS — I2510 Atherosclerotic heart disease of native coronary artery without angina pectoris: Secondary | ICD-10-CM | POA: Diagnosis present

## 2019-12-06 DIAGNOSIS — Z820 Family history of epilepsy and other diseases of the nervous system: Secondary | ICD-10-CM

## 2019-12-06 DIAGNOSIS — G40909 Epilepsy, unspecified, not intractable, without status epilepticus: Secondary | ICD-10-CM | POA: Diagnosis present

## 2019-12-06 DIAGNOSIS — I255 Ischemic cardiomyopathy: Secondary | ICD-10-CM | POA: Diagnosis present

## 2019-12-06 DIAGNOSIS — I10 Essential (primary) hypertension: Secondary | ICD-10-CM | POA: Diagnosis not present

## 2019-12-06 DIAGNOSIS — J449 Chronic obstructive pulmonary disease, unspecified: Secondary | ICD-10-CM | POA: Diagnosis not present

## 2019-12-06 DIAGNOSIS — Z7983 Long term (current) use of bisphosphonates: Secondary | ICD-10-CM

## 2019-12-06 DIAGNOSIS — M25571 Pain in right ankle and joints of right foot: Secondary | ICD-10-CM | POA: Diagnosis not present

## 2019-12-06 DIAGNOSIS — Z6835 Body mass index (BMI) 35.0-35.9, adult: Secondary | ICD-10-CM

## 2019-12-06 DIAGNOSIS — Z950 Presence of cardiac pacemaker: Secondary | ICD-10-CM | POA: Diagnosis not present

## 2019-12-06 DIAGNOSIS — R52 Pain, unspecified: Secondary | ICD-10-CM | POA: Diagnosis not present

## 2019-12-06 DIAGNOSIS — K219 Gastro-esophageal reflux disease without esophagitis: Secondary | ICD-10-CM | POA: Diagnosis present

## 2019-12-06 DIAGNOSIS — I9589 Other hypotension: Secondary | ICD-10-CM | POA: Diagnosis not present

## 2019-12-06 DIAGNOSIS — S82241D Displaced spiral fracture of shaft of right tibia, subsequent encounter for closed fracture with routine healing: Secondary | ICD-10-CM | POA: Diagnosis not present

## 2019-12-06 DIAGNOSIS — R262 Difficulty in walking, not elsewhere classified: Secondary | ICD-10-CM | POA: Diagnosis not present

## 2019-12-06 DIAGNOSIS — N1831 Chronic kidney disease, stage 3a: Secondary | ICD-10-CM | POA: Diagnosis present

## 2019-12-06 DIAGNOSIS — Z96651 Presence of right artificial knee joint: Secondary | ICD-10-CM | POA: Diagnosis present

## 2019-12-06 LAB — URINALYSIS, COMPLETE (UACMP) WITH MICROSCOPIC
Bilirubin Urine: NEGATIVE
Glucose, UA: NEGATIVE mg/dL
Hgb urine dipstick: NEGATIVE
Ketones, ur: NEGATIVE mg/dL
Leukocytes,Ua: NEGATIVE
Nitrite: NEGATIVE
Protein, ur: NEGATIVE mg/dL
Specific Gravity, Urine: 1.016 (ref 1.005–1.030)
pH: 5 (ref 5.0–8.0)

## 2019-12-06 LAB — CBC
HCT: 37.3 % (ref 36.0–46.0)
Hemoglobin: 12.7 g/dL (ref 12.0–15.0)
MCH: 30 pg (ref 26.0–34.0)
MCHC: 34 g/dL (ref 30.0–36.0)
MCV: 88 fL (ref 80.0–100.0)
Platelets: 238 10*3/uL (ref 150–400)
RBC: 4.24 MIL/uL (ref 3.87–5.11)
RDW: 14.7 % (ref 11.5–15.5)
WBC: 10.6 10*3/uL — ABNORMAL HIGH (ref 4.0–10.5)
nRBC: 0 % (ref 0.0–0.2)

## 2019-12-06 LAB — GLUCOSE, CAPILLARY: Glucose-Capillary: 107 mg/dL — ABNORMAL HIGH (ref 70–99)

## 2019-12-06 LAB — SARS CORONAVIRUS 2 BY RT PCR (HOSPITAL ORDER, PERFORMED IN ~~LOC~~ HOSPITAL LAB): SARS Coronavirus 2: NEGATIVE

## 2019-12-06 LAB — BASIC METABOLIC PANEL
Anion gap: 9 (ref 5–15)
BUN: 26 mg/dL — ABNORMAL HIGH (ref 8–23)
CO2: 28 mmol/L (ref 22–32)
Calcium: 9.9 mg/dL (ref 8.9–10.3)
Chloride: 102 mmol/L (ref 98–111)
Creatinine, Ser: 1.07 mg/dL — ABNORMAL HIGH (ref 0.44–1.00)
GFR calc Af Amer: 54 mL/min — ABNORMAL LOW (ref >60)
GFR calc non Af Amer: 47 mL/min — ABNORMAL LOW (ref >60)
Glucose, Bld: 132 mg/dL — ABNORMAL HIGH (ref 70–99)
Potassium: 4.8 mmol/L (ref 3.5–5.1)
Sodium: 139 mmol/L (ref 135–145)

## 2019-12-06 LAB — APTT: aPTT: 36 seconds (ref 24–36)

## 2019-12-06 LAB — TROPONIN I (HIGH SENSITIVITY)
Troponin I (High Sensitivity): 8 ng/L (ref <18)
Troponin I (High Sensitivity): 9 ng/L (ref <18)

## 2019-12-06 LAB — PROTIME-INR
INR: 1.4 — ABNORMAL HIGH (ref 0.8–1.2)
Prothrombin Time: 16.2 seconds — ABNORMAL HIGH (ref 11.4–15.2)

## 2019-12-06 LAB — TSH: TSH: 2.195 u[IU]/mL (ref 0.350–4.500)

## 2019-12-06 MED ORDER — MORPHINE SULFATE (PF) 2 MG/ML IV SOLN
1.0000 mg | INTRAVENOUS | Status: DC | PRN
  Administered 2019-12-07: 1 mg via INTRAVENOUS
  Filled 2019-12-06: qty 1

## 2019-12-06 MED ORDER — ONDANSETRON HCL 4 MG PO TABS: 4.0000 mg | ORAL_TABLET | Freq: Four times a day (QID) | ORAL | Status: DC | PRN

## 2019-12-06 MED ORDER — INSULIN ASPART 100 UNIT/ML ~~LOC~~ SOLN
0.0000 [IU] | SUBCUTANEOUS | Status: DC
  Administered 2019-12-07 (×2): 2 [IU] via SUBCUTANEOUS
  Filled 2019-12-06 (×2): qty 1

## 2019-12-06 MED ORDER — ACETAMINOPHEN 325 MG PO TABS
650.0000 mg | ORAL_TABLET | Freq: Once | ORAL | Status: AC
  Administered 2019-12-06: 650 mg via ORAL

## 2019-12-06 MED ORDER — ACETAMINOPHEN 325 MG PO TABS: 650.0000 mg | ORAL_TABLET | Freq: Four times a day (QID) | ORAL | Status: DC | PRN

## 2019-12-06 MED ORDER — SENNOSIDES-DOCUSATE SODIUM 8.6-50 MG PO TABS: 1.0000 | ORAL_TABLET | Freq: Every evening | ORAL | Status: DC | PRN

## 2019-12-06 MED ORDER — ACETAMINOPHEN 325 MG PO TABS
ORAL_TABLET | ORAL | Status: AC
  Filled 2019-12-06: qty 1

## 2019-12-06 MED ORDER — OXYCODONE-ACETAMINOPHEN 5-325 MG PO TABS
1.0000 | ORAL_TABLET | Freq: Once | ORAL | Status: AC
  Administered 2019-12-06: 1 via ORAL
  Filled 2019-12-06: qty 1

## 2019-12-06 MED ORDER — ACETAMINOPHEN 650 MG RE SUPP: 650.0000 mg | Freq: Four times a day (QID) | RECTAL | Status: DC | PRN

## 2019-12-06 MED ORDER — HYDROCODONE-ACETAMINOPHEN 5-325 MG PO TABS
1.0000 | ORAL_TABLET | ORAL | Status: DC | PRN
  Administered 2019-12-07: 2 via ORAL
  Filled 2019-12-06: qty 2

## 2019-12-06 MED ORDER — ONDANSETRON HCL 4 MG/2ML IJ SOLN: 4.0000 mg | Freq: Four times a day (QID) | INTRAMUSCULAR | Status: DC | PRN

## 2019-12-06 MED ORDER — BISACODYL 5 MG PO TBEC: 5.0000 mg | DELAYED_RELEASE_TABLET | Freq: Every day | ORAL | Status: DC | PRN

## 2019-12-06 MED ORDER — PERFLUTREN LIPID MICROSPHERE
1.0000 mL | INTRAVENOUS | Status: AC | PRN
  Administered 2019-12-06: 2 mL via INTRAVENOUS
  Filled 2019-12-06: qty 10

## 2019-12-06 MED ORDER — SODIUM CHLORIDE 0.9% FLUSH: 3.0000 mL | Freq: Once | INTRAVENOUS | Status: DC

## 2019-12-06 MED ORDER — MELATONIN 5 MG PO TABS
5.0000 mg | ORAL_TABLET | Freq: Every day | ORAL | Status: DC
  Administered 2019-12-06 – 2019-12-13 (×8): 5 mg via ORAL
  Filled 2019-12-06 (×10): qty 1

## 2019-12-06 NOTE — ED Notes (Signed)
Pt given sandwich tray 

## 2019-12-06 NOTE — ED Triage Notes (Signed)
Pt comes into the ED via EMS from home, states she was pulling her pants up and woke up on the floor. Has a hx of syncope. States she woke up with right leg under the chair an has hx of pins in that ankle and was not able to bare wt. 98/68, states normal for her, HR 80's, 97% on RA. CBG 156, just took her insulin prior to fall and was about to eat but did not.

## 2019-12-06 NOTE — ED Notes (Signed)
Attempted to call report to 2A. Per staff, unable to take this patient at this time due to it being "the last bed". This RN provided phone number for call back.

## 2019-12-06 NOTE — ED Provider Notes (Signed)
ER Provider Note       Time seen: 4:50 PM    I have reviewed the vital signs and the nursing notes.  HISTORY   Chief Complaint Loss of Consciousness    HPI Amber [name] is a 86 y.o. female with a history of anemia, anxiety, arthritis, CHF, chronic kidney disease, coronary artery disease, diabetes, hypertension, hyperlipidemia who presents today for syncope.  Patient states she was pulling her pants up and woke up on the floor.  She has a history of syncope.  Patient states she woke up with her right leg under the chair and has a history of right ankle fracture.  Past Medical History:  Diagnosis Date  . Anemia   . Anxiety   . Arthritis   . Benign neoplasm of colon   . CHF (congestive heart failure) ([place])   . Chronic kidney disease   . Chronic pain   . Complication of anesthesia 1980's   hard time waking up  . Coronary artery disease with unspecified angina pectoris   . Cough   . Diabetes mellitus without complication ([place])   . Essential hypertension   . High risk medication use   . Hyperlipidemia   . Plantar fascial fibromatosis   . Presence of permanent cardiac pacemaker   . Shortness of breath dyspnea     Past Surgical History:  Procedure Laterality Date  . ABDOMINAL HYSTERECTOMY    . BACK SURGERY  1960's   cage and screws in lower back  . CATARACT EXTRACTION W/ INTRAOCULAR LENS  IMPLANT, BILATERAL Bilateral   . CORONARY ARTERY BYPASS GRAFT  2008   triple  . JOINT REPLACEMENT Right    hip and knee  . ORIF ANKLE FRACTURE Right 07/27/2014   Procedure: OPEN REDUCTION INTERNAL FIXATION (ORIF) ANKLE FRACTURE;  Surgeon: Alta Corning, MD;  Location: [place];  Service: Orthopedics;  Laterality: Right;  . PACEMAKER INSERTION Left 03/14/2015   Procedure: INSERTION PACEMAKER;  Surgeon: Isaias Cowman, MD;  Location: [facility];  Service: Cardiovascular;  Laterality: Left;  . ROTATOR CUFF REPAIR Left   . TOTAL HIP ARTHROPLASTY Right   . TOTAL HIP ARTHROPLASTY  Left 02/09/2018   Procedure: TOTAL HIP ARTHROPLASTY ANTERIOR APPROACH;  Surgeon: Hessie Knows, MD;  Location: [facility];  Service: Orthopedics;  Laterality: Left;  . TOTAL KNEE ARTHROPLASTY Right   . VEIN LIGATION AND STRIPPING      Allergies Ambien [zolpidem tartrate], Penicillins, Iodine, Succinylcholine, Etodolac, and Nsaids   Review of Systems Constitutional: Negative for fever. Cardiovascular: Negative for chest pain. Respiratory: Negative for shortness of breath. Gastrointestinal: Negative for abdominal pain, vomiting and diarrhea. Musculoskeletal: Positive for right leg pain Skin: Negative for rash. Neurological: Negative for headaches, focal weakness or numbness.  All systems negative/normal/unremarkable except as stated in the HPI  ____________________________________________   PHYSICAL EXAM:  VITAL SIGNS: Vitals:   12/06/19 1249 12/06/19 1428  BP: 135/80 (!) 154/58  Pulse: 60 60  Resp: 18 16  Temp: 98.3 F (36.8 C) 97.6 F (36.4 C)  SpO2: 99% 98%    Constitutional: Alert and oriented. Well appearing and in no distress. Eyes: Conjunctivae are normal. Normal extraocular movements. ENT      Head: Normocephalic and atraumatic.      Nose: No congestion/rhinnorhea.      Mouth/Throat: Mucous membranes are moist.      Neck: No stridor. Cardiovascular: Normal rate, regular rhythm. No murmurs, rubs, or gallops. Respiratory: Normal respiratory effort without tachypnea nor retractions. Breath sounds  are clear and equal bilaterally. No wheezes/rales/rhonchi. Gastrointestinal: Soft and nontender. Normal bowel sounds Musculoskeletal: Pain with range of motion and tenderness to the distal right lower leg.  Right lower leg appears unstable Neurologic:  Normal speech and language. No gross focal neurologic deficits are appreciated.  Skin:  Skin is warm, dry and intact. No rash noted. Psychiatric: Speech and behavior are normal.   ____________________________________________  EKG: Interpreted by me.  Ventricular paced rhythm with a rate of 61 bpm, normal pacemaker function is noted  ____________________________________________   LABS (pertinent positives/negatives)  Labs Reviewed  BASIC METABOLIC PANEL - Abnormal; Notable for the following components:      Result Value   Glucose, Bld 132 (*)    BUN 26 (*)    Creatinine, Ser 1.07 (*)    GFR calc non Af Amer 47 (*)    GFR calc Af Amer 54 (*)    All other components within normal limits  CBC - Abnormal; Notable for the following components:   WBC 10.6 (*)    All other components within normal limits  URINALYSIS, COMPLETE (UACMP) WITH MICROSCOPIC  CBG MONITORING, ED  TROPONIN I (HIGH SENSITIVITY)    RADIOLOGY  Images were viewed by me Right ankle x-ray IMPRESSION: Mildly displaced distal tibial shaft fracture.  DIFFERENTIAL DIAGNOSIS  Syncope, fracture, contusion, dislocation  ASSESSMENT AND PLAN  Syncope, distal right tibia fracture   Plan: The patient had presented for a syncopal event. Patient's labs do not reveal any acute process.  Her imaging revealed a distal tibial shaft fracture that was mildly displaced.  I discussed this with orthopedics.  Likely will be treated nonoperatively.  She certainly cannot bear any weight on the right leg.  We placed her in a posterior splint which she tolerated well.  She seems to be neurovascularly intact.  We will interrogate her pacemaker.  I will discuss with the hospitalist for admission.  Lenise Arena MD    Note: This note was generated in part or whole with voice recognition software. Voice recognition is usually quite accurate but there are transcription errors that can and very often do occur. I apologize for any typographical errors that were not detected and corrected.     Earleen Newport, MD 12/06/19 [phone]

## 2019-12-06 NOTE — H&P (Signed)
History and Physical   [facility] - [place] @ [facility] Admission History and Physical [employer], D.O.    Patient Name: Suhavi Loughnane MR#: [id number] Date of Birth: [birth year] Date of Admission: 12/06/2019  Referring MD/NP/PA: Dr. Jimmye Norman Primary Care Physician: Idelle Crouch, MD  Chief Complaint:  Chief Complaint  Patient presents with  . Loss of Consciousness    HPI: Alycen Gonya is a 86 y.o. female with a known history of anemia, anxiety, arthritis, CHF, chronic kidney disease, coronary artery disease, diabetes, hypertension, hyperlipidemia, PPM presents to the emergency department for evaluation of syncope.  Patient was in a usual state of health until this morning when she was getting dressed she believes she lost consciousness, unclear for how long she was down but woke up on the floor.  No tongue injury or incontinence.  Was unwitnessed, but daughter woke up to find her on the floor.  Daughter reports that she has been complaining of dizziness intermittently.   Patient denies fevers/chills, chest pain, shortness of breath, N/V/C/D, abdominal pain, dysuria/frequency.   Of note patient rec'd 2nd dose of COVID vaccine two weeks ago.   Usually ambulates independently but cannot walk long distances or stand for long periods of time.    Otherwise there has been no change in status. Patient has been taking medication as prescribed and there has been no recent change in medication or diet.  No recent antibiotics.  There has been no recent illness, hospitalizations, travel or sick contacts.    EMS/ED Course: Patient received Percocet and a short leg splint. Medical admission has been requested for further management of syncope, mildly displaced distal right tibia fracture.  Review of Systems:  CONSTITUTIONAL: Positive loss of consciousness.  No fever/chills, fatigue, weakness, weight gain/loss, headache. EYES: No blurry or double vision. ENT: No tinnitus,  postnasal drip, redness or soreness of the oropharynx. RESPIRATORY: No cough, dyspnea, wheeze.  No hemoptysis.  CARDIOVASCULAR: No chest pain, palpitations, syncope, orthopnea. No lower extremity edema.  GASTROINTESTINAL: No nausea, vomiting, abdominal pain, diarrhea, constipation.  No hematemesis, melena or hematochezia. GENITOURINARY: No dysuria, frequency, hematuria. ENDOCRINE: No polyuria or nocturia. No heat or cold intolerance. HEMATOLOGY: No anemia, bruising, bleeding. INTEGUMENTARY: No rashes, ulcers, lesions. MUSCULOSKELETAL: Leg pain.  No arthritis, gout NEUROLOGIC: No numbness, tingling, ataxia, seizure-type activity, weakness. PSYCHIATRIC: No anxiety, depression, insomnia.   Past Medical History:  Diagnosis Date  . Anemia   . Anxiety   . Arthritis   . Benign neoplasm of colon   . CHF (congestive heart failure) ([place])   . Chronic kidney disease   . Chronic pain   . Complication of anesthesia 1980's   hard time waking up  . Coronary artery disease with unspecified angina pectoris   . Cough   . Diabetes mellitus without complication ([place])   . Essential hypertension   . High risk medication use   . Hyperlipidemia   . Plantar fascial fibromatosis   . Presence of permanent cardiac pacemaker   . Shortness of breath dyspnea     Past Surgical History:  Procedure Laterality Date  . ABDOMINAL HYSTERECTOMY    . BACK SURGERY  1960's   cage and screws in lower back  . CATARACT EXTRACTION W/ INTRAOCULAR LENS  IMPLANT, BILATERAL Bilateral   . CORONARY ARTERY BYPASS GRAFT  2008   triple  . JOINT REPLACEMENT Right    hip and knee  . ORIF ANKLE FRACTURE Right 07/27/2014   Procedure: OPEN REDUCTION INTERNAL FIXATION (ORIF)  ANKLE FRACTURE;  Surgeon: Alta Corning, MD;  Location: [place];  Service: Orthopedics;  Laterality: Right;  . PACEMAKER INSERTION Left 03/14/2015   Procedure: INSERTION PACEMAKER;  Surgeon: Isaias Cowman, MD;  Location: [facility];  Service: Cardiovascular;   Laterality: Left;  . ROTATOR CUFF REPAIR Left   . TOTAL HIP ARTHROPLASTY Right   . TOTAL HIP ARTHROPLASTY Left 02/09/2018   Procedure: TOTAL HIP ARTHROPLASTY ANTERIOR APPROACH;  Surgeon: Hessie Knows, MD;  Location: [facility];  Service: Orthopedics;  Laterality: Left;  . TOTAL KNEE ARTHROPLASTY Right   . VEIN LIGATION AND STRIPPING       reports that she has never smoked. She has never used smokeless tobacco. She reports that she does not drink alcohol or use drugs.  Allergies  Allergen Reactions  . Ambien [Zolpidem Tartrate] Other (See Comments)    Reaction:  Keeps pt awake   . Penicillins Itching and Other (See Comments)    Has patient had a PCN reaction causing immediate rash, facial/tongue/throat swelling, SOB or lightheadedness with hypotension: No Has patient had a PCN reaction causing severe rash involving mucus membranes or skin necrosis: No Has patient had a PCN reaction that required hospitalization No Has patient had a PCN reaction occurring within the last 10 years: No If all of the above answers are "NO", then may proceed with Cephalosporin use.  . Iodine Itching  . Succinylcholine Other (See Comments)    Reaction:  Unknown   . Etodolac Other (See Comments)    GI upset  . Nsaids Other (See Comments)    Reaction: gi upset    Family History  Problem Relation Age of Onset  . Heart attack Mother   . Heart disease Father   . Alzheimer's disease Sister   . Cervical cancer Sister   . Heart failure Son     Prior to Admission medications   Medication Sig Start Date End Date Taking? Authorizing Provider  acetaminophen (TYLENOL) 500 MG tablet Take 1,000 mg by mouth every 6 (six) hours as needed.    [provider]  albuterol (PROVENTIL HFA;VENTOLIN HFA) 108 (90 Base) MCG/ACT inhaler Inhale 2 puffs into the lungs every 6 (six) hours as needed for wheezing or shortness of breath. 01/28/17   Loletha Grayer, MD  alendronate (FOSAMAX) 70 MG tablet Take 70 mg by mouth  every Thursday.     [provider]  ALPRAZolam Duanne Moron) 1 MG tablet Take 1 tablet (1 mg total) by mouth at bedtime as needed for anxiety. Patient taking differently: Take 0.5 mg by mouth at bedtime as needed for anxiety.  07/31/14   Elgergawy, Silver Huguenin, MD  amLODipine (NORVASC) 10 MG tablet Take 5 mg by mouth daily.     [provider]  apixaban (ELIQUIS) 5 MG TABS tablet Take 5 mg by mouth 2 (two) times daily.    [provider]  calcium carbonate (OSCAL) 1500 (600 Ca) MG TABS tablet Take 600 mg of elemental calcium by mouth daily with breakfast.    [provider]  carvedilol (COREG) 6.25 MG tablet Take 1 tablet (6.25 mg total) by mouth 2 (two) times daily with a meal. 01/28/17   Loletha Grayer, MD  furosemide (LASIX) 20 MG tablet TAKE ONE TABLET BY MOUTH ONCE DAILY 10/21/17   Darylene Price A, FNP  gabapentin (NEURONTIN) 100 MG capsule Take 200 mg by mouth 3 (three) times daily.     [provider]  HYDROcodone-acetaminophen (NORCO/VICODIN) 5-325 MG tablet Take 1 tablet by  mouth 2 (two) times daily as needed for moderate pain (pain score 4-6). 08/31/19   Karen Kitchens, NP  insulin aspart protamine- aspart (NOVOLOG MIX 70/30) (70-30) 100 UNIT/ML injection Inject 0.15 mLs (15 Units total) into the skin 2 (two) times daily with a meal. 01/28/17   Loletha Grayer, MD  insulin regular (NOVOLIN R,HUMULIN R) 100 units/mL injection Inject 10 Units into the skin daily with breakfast. If glucose less than 100 mg/dl pt does not take any; if glucose >100 mg/dl she takes Regular 10 units with breakfast    [provider]  levETIRAcetam (KEPPRA) 500 MG tablet Take 500 mg by mouth 2 (two) times daily.  03/25/17 02/09/18  [provider]  Menthol-Camphor (ICY HOT ARTHRITIS PAIN RELIEF) 16-4 % LOTN Apply 1 application topically 2 (two) times daily as needed. 06/03/17   Laban Emperor, PA-C  Multiple Vitamins-Minerals (PRESERVISION AREDS 2) CAPS Take 1 capsule  by mouth daily.    [provider]  omeprazole (PRILOSEC) 20 MG capsule Take 20 mg by mouth daily.    [provider]  pravastatin (PRAVACHOL) 40 MG tablet Take 40 mg by mouth at bedtime.    [provider]  sacubitril-valsartan (ENTRESTO) 24-26 MG Take 1 tablet by mouth 2 (two) times daily.    [provider]  traMADol (ULTRAM) 50 MG tablet Take 1 tablet (50 mg total) by mouth every 6 (six) hours as needed. 02/11/18   Reche Dixon, PA-C  traMADol (ULTRAM) 50 MG tablet Take 1 tablet (50 mg total) by mouth every 6 (six) hours as needed for severe pain. 09/01/18   Darletta Moll, PA-C    Physical Exam: Vitals:   12/06/19 1227 12/06/19 1249 12/06/19 1428 12/06/19 1708  BP:  135/80 (!) 154/58 (!) 155/80  Pulse:  60 60 63  Resp:  18 16 15   Temp:  98.3 F (36.8 C) 97.6 F (36.4 C)   TempSrc:  Oral Oral   SpO2:  99% 98% 100%  Weight: 77.1 kg     Height: 4\' 10"  (1.473 m)       GENERAL: 86 y.o.-year-old female patient, well-developed, well-nourished lying in the bed in no acute distress.  Pleasant and cooperative.   HEENT: Head atraumatic, normocephalic. Pupils equal. Mucus membranes moist. NECK: Supple. No JVD. CHEST: Normal breath sounds bilaterally. No wheezing, rales, rhonchi or crackles. No use of accessory muscles of respiration.  No reproducible chest wall tenderness.  CARDIOVASCULAR: S1, S2 normal. No murmurs, rubs, or gallops. Cap refill <2 seconds. Pulses intact distally.  ABDOMEN: Soft, nondistended, nontender. No rebound, guarding, rigidity. Normoactive bowel sounds present in all four quadrants.  EXTREMITIES: Tenderness to palpation, limited range of motion secondary to pain.  Right leg splinted no pedal edema, cyanosis, or clubbing. No calf tenderness or Homan's sign.  NEUROLOGIC: The patient is alert and oriented x 3. Cranial nerves II through XII are grossly intact with no focal sensorimotor deficit. PSYCHIATRIC:  Normal affect, mood,  thought content. SKIN: Warm, dry, and intact without obvious rash, lesion, or ulcer.    Labs on Admission:  CBC: Recent Labs  Lab 12/06/19 1321  WBC 10.6*  HGB 12.7  HCT 37.3  MCV 88.0  PLT 99991111   Basic Metabolic Panel: Recent Labs  Lab 12/06/19 1321  NA 139  K 4.8  CL 102  CO2 28  GLUCOSE 132*  BUN 26*  CREATININE 1.07*  CALCIUM 9.9   GFR: Estimated Creatinine Clearance: 32.4 mL/min (A) (by C-G formula based on  SCr of 1.07 mg/dL (H)). Liver Function Tests: No results for input(s): AST, ALT, ALKPHOS, BILITOT, PROT, ALBUMIN in the last 168 hours. No results for input(s): LIPASE, AMYLASE in the last 168 hours. No results for input(s): AMMONIA in the last 168 hours. Coagulation Profile: No results for input(s): INR, PROTIME in the last 168 hours. Cardiac Enzymes: No results for input(s): CKTOTAL, CKMB, CKMBINDEX, TROPONINI in the last 168 hours. BNP (last 3 results) No results for input(s): PROBNP in the last 8760 hours. HbA1C: No results for input(s): HGBA1C in the last 72 hours. CBG: No results for input(s): GLUCAP in the last 168 hours. Lipid Profile: No results for input(s): CHOL, HDL, LDLCALC, TRIG, CHOLHDL, LDLDIRECT in the last 72 hours. Thyroid Function Tests: No results for input(s): TSH, T4TOTAL, FREET4, T3FREE, THYROIDAB in the last 72 hours. Anemia Panel: No results for input(s): VITAMINB12, FOLATE, FERRITIN, TIBC, IRON, RETICCTPCT in the last 72 hours. Urine analysis:    Component Value Date/Time   COLORURINE YELLOW (A) 12/06/2019 [place] (A) 12/06/2019 1829   APPEARANCEUR Hazy 11/25/2013 2129   LABSPEC 1.016 12/06/2019 1829   LABSPEC 1.019 11/25/2013 2129   PHURINE 5.0 12/06/2019 1829   GLUCOSEU NEGATIVE 12/06/2019 1829   GLUCOSEU Negative 11/25/2013 2129   HGBUR NEGATIVE 12/06/2019 [place] NEGATIVE 12/06/2019 1829   BILIRUBINUR Negative 11/25/2013 2129   KETONESUR NEGATIVE 12/06/2019 1829   PROTEINUR NEGATIVE  12/06/2019 1829   NITRITE NEGATIVE 12/06/2019 1829   LEUKOCYTESUR NEGATIVE 12/06/2019 1829   LEUKOCYTESUR Trace 11/25/2013 2129   Sepsis Labs: @LABRCNTIP (procalcitonin:4,lacticidven:4) )No results found for this or any previous visit (from the past 240 hour(s)).   Radiological Exams on Admission: DG Ankle Complete Right  Result Date: 12/06/2019 CLINICAL DATA:  [place].  Injured leg. EXAM: RIGHT ANKLE - COMPLETE 3+ VIEW COMPARISON:  None. FINDINGS: There is a mildly displaced fracture involving the distal tibial shaft above the level of the ankle fracture fixation hardware. Approximately the 2 cortex width of lateral displacement and 1 cortex with of anterior displacement. The medial and lateral ankle fixation hardware is intact. No ankle fractures. Hindfoot and midfoot degenerative changes are noted. IMPRESSION: Mildly displaced distal tibial shaft fracture. Electronically Signed   By: Marijo Sanes M.D.   On: 12/06/2019 15:56    EKG: Ventricular paced rhythm at 61 bpm with normal axis and nonspecific ST-T wave changes.   Assessment/Plan  This is a 86 y.o. female with a history of anemia, anxiety, arthritis, CHF, chronic kidney disease, coronary artery disease, diabetes, hypertension, hyperlipidemia, PPM now being admitted with:  #. Syncope - Admit IP with remote telemetry monitoring - IV fluid hydration - Check orthostatics - Check echo and interrogate pacemaker - Trend trops, check TSH, lipids - Cardio consult for preop evaluation, sees Dr. Nehemiah Massed   #.  Mildly displaced right distal tibial shaft fracture Management per Ortho Pain control Bedrest for now N.p.o. after midnight Hold Eliquis for possible OR in a.m.  #. H/o Diabetes - Accuchecks achs with RISS coverage - Heart healthy, carb controlled diet and NPO after midnight -Hold 70/30 for now  #. History of hypertension - Continue Norvasc, Coreg, Lasix, Entresto  #. History of hyperlipidemia - Continue  pravastatin  #. History of GERD - Continue Protonix  Admission status: Inpatient with remote telemetry IV Fluids: Hep-Lock Diet/Nutrition: Heart healthy, carb controlled, n.p.o. after midnight Consults called: We will need cardiology in a.m. for preop evaluation DVT Px:  Early ambulation. Code Status: Full Code  Disposition Plan: To be determined  All the records are reviewed and case discussed with ED provider. Management plans discussed with the patient and/or family who express understanding and agree with plan of care.  Halford Goetzke D.O. on 12/06/2019 at 7:04 PM CC: Primary care physician; Idelle Crouch, MD   12/06/2019, 7:04 PM

## 2019-12-07 DIAGNOSIS — I10 Essential (primary) hypertension: Secondary | ICD-10-CM

## 2019-12-07 DIAGNOSIS — R52 Pain, unspecified: Secondary | ICD-10-CM

## 2019-12-07 DIAGNOSIS — S82151A Displaced fracture of right tibial tuberosity, initial encounter for closed fracture: Secondary | ICD-10-CM

## 2019-12-07 LAB — PROTIME-INR
INR: 1.4 — ABNORMAL HIGH (ref 0.8–1.2)
Prothrombin Time: 16.3 seconds — ABNORMAL HIGH (ref 11.4–15.2)

## 2019-12-07 LAB — GLUCOSE, CAPILLARY
Glucose-Capillary: 104 mg/dL — ABNORMAL HIGH (ref 70–99)
Glucose-Capillary: 110 mg/dL — ABNORMAL HIGH (ref 70–99)
Glucose-Capillary: 111 mg/dL — ABNORMAL HIGH (ref 70–99)
Glucose-Capillary: 140 mg/dL — ABNORMAL HIGH (ref 70–99)
Glucose-Capillary: 142 mg/dL — ABNORMAL HIGH (ref 70–99)
Glucose-Capillary: 159 mg/dL — ABNORMAL HIGH (ref 70–99)
Glucose-Capillary: 61 mg/dL — ABNORMAL LOW (ref 70–99)

## 2019-12-07 LAB — BASIC METABOLIC PANEL
Anion gap: 9 (ref 5–15)
BUN: 29 mg/dL — ABNORMAL HIGH (ref 8–23)
CO2: 25 mmol/L (ref 22–32)
Calcium: 9.2 mg/dL (ref 8.9–10.3)
Chloride: 105 mmol/L (ref 98–111)
Creatinine, Ser: 0.92 mg/dL (ref 0.44–1.00)
GFR calc Af Amer: >60 mL/min (ref >60)
GFR calc non Af Amer: 56 mL/min — ABNORMAL LOW (ref >60)
Glucose, Bld: 69 mg/dL — ABNORMAL LOW (ref 70–99)
Potassium: 3.8 mmol/L (ref 3.5–5.1)
Sodium: 139 mmol/L (ref 135–145)

## 2019-12-07 LAB — LIPID PANEL
Cholesterol: 164 mg/dL (ref 0–200)
HDL: 43 mg/dL (ref >40)
LDL Cholesterol: 107 mg/dL — ABNORMAL HIGH (ref 0–99)
Total CHOL/HDL Ratio: 3.8 RATIO
Triglycerides: 68 mg/dL (ref <150)
VLDL: 14 mg/dL (ref 0–40)

## 2019-12-07 LAB — CBC
HCT: 35.7 % — ABNORMAL LOW (ref 36.0–46.0)
Hemoglobin: 11.5 g/dL — ABNORMAL LOW (ref 12.0–15.0)
MCH: 29.3 pg (ref 26.0–34.0)
MCHC: 32.2 g/dL (ref 30.0–36.0)
MCV: 91.1 fL (ref 80.0–100.0)
Platelets: 222 10*3/uL (ref 150–400)
RBC: 3.92 MIL/uL (ref 3.87–5.11)
RDW: 14.7 % (ref 11.5–15.5)
WBC: 8.2 10*3/uL (ref 4.0–10.5)
nRBC: 0 % (ref 0.0–0.2)

## 2019-12-07 LAB — TROPONIN I (HIGH SENSITIVITY): Troponin I (High Sensitivity): 10 ng/L (ref <18)

## 2019-12-07 LAB — ECHOCARDIOGRAM COMPLETE
Height: 58 in
Weight: 2720 oz

## 2019-12-07 LAB — HEMOGLOBIN A1C
Hgb A1c MFr Bld: 7.4 % — ABNORMAL HIGH (ref 4.8–5.6)
Mean Plasma Glucose: 165.68 mg/dL

## 2019-12-07 LAB — APTT
aPTT: 61 seconds — ABNORMAL HIGH (ref 24–36)
aPTT: 116 seconds — ABNORMAL HIGH (ref 24–36)

## 2019-12-07 LAB — HEPARIN LEVEL (UNFRACTIONATED): Heparin Unfractionated: >3.6 IU/mL — ABNORMAL HIGH (ref 0.30–0.70)

## 2019-12-07 MED ORDER — PRAVASTATIN SODIUM 20 MG PO TABS
40.0000 mg | ORAL_TABLET | Freq: Every day | ORAL | Status: DC
  Administered 2019-12-07 – 2019-12-13 (×7): 40 mg via ORAL
  Filled 2019-12-07 (×5): qty 2
  Filled 2019-12-07: qty 1
  Filled 2019-12-07 (×2): qty 2

## 2019-12-07 MED ORDER — SACUBITRIL-VALSARTAN 24-26 MG PO TABS
1.0000 | ORAL_TABLET | Freq: Two times a day (BID) | ORAL | Status: DC
  Administered 2019-12-07 – 2019-12-09 (×3): 1 via ORAL
  Filled 2019-12-07 (×8): qty 1

## 2019-12-07 MED ORDER — TRAMADOL HCL 50 MG PO TABS: 50.0000 mg | ORAL_TABLET | Freq: Four times a day (QID) | ORAL | Status: DC | PRN

## 2019-12-07 MED ORDER — CARVEDILOL 3.125 MG PO TABS
6.2500 mg | ORAL_TABLET | Freq: Two times a day (BID) | ORAL | Status: DC
  Administered 2019-12-07 – 2019-12-09 (×2): 6.25 mg via ORAL
  Filled 2019-12-07: qty 2
  Filled 2019-12-07: qty 1
  Filled 2019-12-07: qty 2

## 2019-12-07 MED ORDER — ALBUTEROL SULFATE (2.5 MG/3ML) 0.083% IN NEBU: 2.5000 mg | INHALATION_SOLUTION | Freq: Four times a day (QID) | RESPIRATORY_TRACT | Status: DC | PRN

## 2019-12-07 MED ORDER — HEPARIN (PORCINE) 25000 UT/250ML-% IV SOLN
700.0000 [IU]/h | INTRAVENOUS | Status: DC
  Administered 2019-12-07: 650 [IU]/h via INTRAVENOUS
  Filled 2019-12-07: qty 250

## 2019-12-07 MED ORDER — INSULIN ASPART 100 UNIT/ML ~~LOC~~ SOLN: 0.0000 [IU] | Freq: Every day | SUBCUTANEOUS | Status: DC

## 2019-12-07 MED ORDER — CLINDAMYCIN PHOSPHATE 600 MG/50ML IV SOLN
600.0000 mg | Freq: Once | INTRAVENOUS | Status: AC
  Administered 2019-12-08: 600 mg via INTRAVENOUS

## 2019-12-07 MED ORDER — AMLODIPINE BESYLATE 5 MG PO TABS
5.0000 mg | ORAL_TABLET | Freq: Every day | ORAL | Status: DC
  Administered 2019-12-09: 5 mg via ORAL
  Filled 2019-12-07 (×2): qty 1

## 2019-12-07 MED ORDER — HEPARIN BOLUS VIA INFUSION
1000.0000 [IU] | Freq: Once | INTRAVENOUS | Status: AC
  Administered 2019-12-07: 1000 [IU] via INTRAVENOUS
  Filled 2019-12-07: qty 1000

## 2019-12-07 MED ORDER — SODIUM CHLORIDE 0.9 % IV BOLUS
500.0000 mL | Freq: Once | INTRAVENOUS | Status: AC
  Administered 2019-12-07: 500 mL via INTRAVENOUS

## 2019-12-07 MED ORDER — LEVETIRACETAM 500 MG PO TABS
500.0000 mg | ORAL_TABLET | Freq: Two times a day (BID) | ORAL | Status: DC
  Administered 2019-12-07 – 2019-12-14 (×14): 500 mg via ORAL
  Filled 2019-12-07 (×18): qty 1

## 2019-12-07 MED ORDER — FUROSEMIDE 20 MG PO TABS
20.0000 mg | ORAL_TABLET | Freq: Every day | ORAL | Status: DC
  Administered 2019-12-07 – 2019-12-09 (×2): 20 mg via ORAL
  Filled 2019-12-07 (×3): qty 1

## 2019-12-07 MED ORDER — INSULIN ASPART 100 UNIT/ML ~~LOC~~ SOLN
0.0000 [IU] | Freq: Three times a day (TID) | SUBCUTANEOUS | Status: DC
  Administered 2019-12-08 – 2019-12-09 (×2): 1 [IU] via SUBCUTANEOUS
  Administered 2019-12-09 – 2019-12-10 (×2): 2 [IU] via SUBCUTANEOUS
  Administered 2019-12-10: 1 [IU] via SUBCUTANEOUS
  Administered 2019-12-11: 11:00:00 3 [IU] via SUBCUTANEOUS
  Administered 2019-12-11 – 2019-12-12 (×2): 1 [IU] via SUBCUTANEOUS
  Administered 2019-12-12: 10:00:00 3 [IU] via SUBCUTANEOUS
  Administered 2019-12-13 – 2019-12-14 (×3): 1 [IU] via SUBCUTANEOUS
  Filled 2019-12-07 (×12): qty 1

## 2019-12-07 MED ORDER — GABAPENTIN 100 MG PO CAPS
200.0000 mg | ORAL_CAPSULE | Freq: Three times a day (TID) | ORAL | Status: DC
  Administered 2019-12-07 – 2019-12-14 (×20): 200 mg via ORAL
  Filled 2019-12-07 (×22): qty 2

## 2019-12-07 MED ORDER — PANTOPRAZOLE SODIUM 40 MG PO TBEC
40.0000 mg | DELAYED_RELEASE_TABLET | Freq: Every day | ORAL | Status: DC
  Administered 2019-12-07 – 2019-12-14 (×8): 40 mg via ORAL
  Filled 2019-12-07 (×8): qty 1

## 2019-12-07 NOTE — ED Notes (Signed)
Heparin drip started at 158am by this RN during epic downtime. Heparin verified by Raquel RN.

## 2019-12-07 NOTE — Progress Notes (Signed)
ANTICOAGULATION CONSULT NOTE - Initial Consult  Pharmacy Consult for heparin Indication: atrial fibrillation  Allergies  Allergen Reactions  . Ambien [Zolpidem Tartrate] Other (See Comments)    Reaction:  Keeps pt awake   . Penicillins Itching and Other (See Comments)    Has patient had a PCN reaction causing immediate rash, facial/tongue/throat swelling, SOB or lightheadedness with hypotension: No Has patient had a PCN reaction causing severe rash involving mucus membranes or skin necrosis: No Has patient had a PCN reaction that required hospitalization No Has patient had a PCN reaction occurring within the last 10 years: No If all of the above answers are "NO", then may proceed with Cephalosporin use.  . Iodine Itching  . Succinylcholine Other (See Comments)    Reaction:  Unknown   . Etodolac Other (See Comments)    GI upset  . Nsaids Other (See Comments)    Reaction: gi upset    Patient Measurements: Height: 4\' 10"  (147.3 cm) Weight: 77.1 kg (170 lb) IBW/kg (Calculated) : 40.9 Heparin Dosing Weight: 86 kg  Vital Signs: Temp: 97.6 F (36.4 C) (06/01 1428) Temp Source: Oral (06/01 1428) BP: 135/57 (06/02 0030) Pulse Rate: 61 (06/02 0030)  Labs: Recent Labs    12/06/19 1321 12/06/19 2059  HGB 12.7  --   HCT 37.3  --   PLT 238  --   APTT  --  36  LABPROT  --  16.2*  INR  --  1.4*  CREATININE 1.07*  --   TROPONINIHS 8 9    Estimated Creatinine Clearance: 32.4 mL/min (A) (by C-G formula based on SCr of 1.07 mg/dL (H)).   Medical History: Past Medical History:  Diagnosis Date  . Anemia   . Anxiety   . Arthritis   . Benign neoplasm of colon   . CHF (congestive heart failure) ([place])   . Chronic kidney disease   . Chronic pain   . Complication of anesthesia 1980's   hard time waking up  . Coronary artery disease with unspecified angina pectoris   . Cough   . Diabetes mellitus without complication ([place])   . Essential hypertension   . High risk medication  use   . Hyperlipidemia   . Plantar fascial fibromatosis   . Presence of permanent cardiac pacemaker   . Shortness of breath dyspnea     Medications:  Scheduled:  . insulin aspart  0-15 Units Subcutaneous Q4H  . melatonin  5 mg Oral QHS  . sodium chloride flush  3 mL Intravenous Once    Assessment: Patient admitted for LOC w/ R hip dislocation s/p reduction in ED. Patient has a h/o CHF, afib anticoagulated w/ eliquis 5 mg bid PTA, CKD III, is being started on a heparin drip for R hip arthroplasty in OR procedure in the am. Last known eliquis dose per patient was 06/01 @ 0800. Baseline labs WNL, INR slightly elevated at 1.4, baseline anti-Xa pending.  Goal of Therapy:  Heparin level 0.3-0.7 units/ml  APTT 66 - 102 seconds Monitor platelets by anticoagulation protocol: Yes   Plan:  Will hold off on bolus until baseline anti-Xa results. Will start heparin rate at 650 units/hr Will plan to check aPTT at 0730. Will monitor daily CBC's and adjust per aPTT's if anti-Xa comes back elevated.  Tobie Lords, PharmD, BCPS Clinical Pharmacist 12/07/2019,1:16 AM

## 2019-12-07 NOTE — ED Notes (Signed)
RN called pharmacy to verify aPTT value and current Heprin Infusion rate.

## 2019-12-07 NOTE — Progress Notes (Signed)
Inpatient Diabetes Program Recommendations  AACE/ADA: New Consensus Statement on Inpatient Glycemic Control (2015)  Target Ranges:  Prepandial:   less than 140 mg/dL      Peak postprandial:   less than 180 mg/dL (1-2 hours)      Critically ill patients:  140 - 180 mg/dL   Lab Results  Component Value Date   GLUCAP 142 (H) 12/07/2019   HGBA1C 7.4 (H) 12/06/2019    Review of Glycemic Control Results for KYNDLE, MEGGITT" (MRN [id number]) as of 12/07/2019 12:03  Ref. Range 12/06/2019 20:58 12/07/2019 00:42 12/07/2019 04:35 12/07/2019 05:28 12/07/2019 07:30 12/07/2019 11:44  Glucose-Capillary Latest Ref Range: 70 - 99 mg/dL 107 (H) 140 (H)  Novolog 2 units 61 (L) 111 (H) 104 (H) 142 (H)  Novolog 2 units   Syncope and Collapse Diabetes history: DM 2 Outpatient Diabetes medications: Lantus 22 units Daily Current orders for Inpatient glycemic control:  Novolog 0-15 units Q4 hours  A1c 7.4% on 6/1  Inpatient Diabetes Program Recommendations:    Hypoglycemia after Novolog 2 units. Due to pt age, consider decreasing Novolog Correction to "sensitive" 0-9 units Q4 hours.   Thanks,  Tama Headings RN, MSN, BC-ADM Inpatient Diabetes Coordinator Team Pager [phone] (8a-5p)

## 2019-12-07 NOTE — Progress Notes (Addendum)
[facility] [place] NAME: Amber [name]    MR#:  [id number]  DATE OF BIRTH:  [birth year]  SUBJECTIVE:   Patient came in after a syncopal episode at home. No clear prodromal symptoms. Feels better. Daughter in the room. REVIEW OF SYSTEMS:   Review of Systems  Constitutional: Negative for chills, fever and weight loss.  HENT: Negative for ear discharge, ear pain and nosebleeds.   Eyes: Negative for blurred vision, pain and discharge.  Respiratory: Negative for sputum production, shortness of breath, wheezing and stridor.   Cardiovascular: Negative for chest pain, palpitations, orthopnea and PND.  Gastrointestinal: Negative for abdominal pain, diarrhea, nausea and vomiting.  Genitourinary: Negative for frequency and urgency.  Musculoskeletal: Positive for joint pain. Negative for back pain.  Neurological: Positive for weakness. Negative for sensory change, speech change and focal weakness.  Psychiatric/Behavioral: Negative for depression and hallucinations. The patient is not nervous/anxious.    Tolerating Diet:yes Tolerating PT:   DRUG ALLERGIES:   Allergies  Allergen Reactions  . Ambien [Zolpidem Tartrate] Other (See Comments)    Reaction:  Keeps pt awake   . Penicillins Itching and Other (See Comments)    Has patient had a PCN reaction causing immediate rash, facial/tongue/throat swelling, SOB or lightheadedness with hypotension: No Has patient had a PCN reaction causing severe rash involving mucus membranes or skin necrosis: No Has patient had a PCN reaction that required hospitalization No Has patient had a PCN reaction occurring within the last 10 years: No If all of the above answers are "NO", then may proceed with Cephalosporin use.  . Iodine Itching  . Succinylcholine Other (See Comments)    Reaction:  Unknown   . Etodolac Other (See Comments)    GI upset  . Nsaids Other (See Comments)    Reaction: gi upset     VITALS:  Blood pressure (!) 148/61, pulse (!) 59, temperature 98.2 F (36.8 C), temperature source Oral, resp. rate 17, height 4\' 10"  (1.473 m), weight 77.1 kg, SpO2 98 %.  PHYSICAL EXAMINATION:   Physical Exam  GENERAL:  86 y.o.-year-old patient lying in the bed with no acute distress.  EYES: Pupils equal, round, reactive to light and accommodation. No scleral icterus.   HEENT: Head atraumatic, normocephalic. Oropharynx and nasopharynx clear.  NECK:  Supple, no jugular venous distention. No thyroid enlargement, no tenderness.  LUNGS: Normal breath sounds bilaterally, no wheezing, rales, rhonchi. No use of accessory muscles of respiration.  CARDIOVASCULAR: S1, S2 normal. No murmurs, rubs, or gallops.  ABDOMEN: Soft, nontender, nondistended. Bowel sounds present. No organomegaly or mass.  EXTREMITIES: No cyanosis, clubbing or edema b/l.   Right LE cast + NEUROLOGIC: Cranial nerves II through XII are intact. No focal Motor or sensory deficits b/l.   PSYCHIATRIC:  patient is alert and oriented x 3.  SKIN: No obvious rash, lesion, or ulcer.   LABORATORY PANEL:  CBC Recent Labs  Lab 12/07/19 0437  WBC 8.2  HGB 11.5*  HCT 35.7*  PLT 222    Chemistries  Recent Labs  Lab 12/07/19 0437  NA 139  K 3.8  CL 105  CO2 25  GLUCOSE 69*  BUN 29*  CREATININE 0.92  CALCIUM 9.2   Cardiac Enzymes No results for input(s): TROPONINI in the last 168 hours. RADIOLOGY:  DG Ankle Complete Right  Result Date: 12/06/2019 CLINICAL DATA:  [place].  Injured leg. EXAM: RIGHT ANKLE - COMPLETE 3+ VIEW COMPARISON:  None. FINDINGS: There  is a mildly displaced fracture involving the distal tibial shaft above the level of the ankle fracture fixation hardware. Approximately the 2 cortex width of lateral displacement and 1 cortex with of anterior displacement. The medial and lateral ankle fixation hardware is intact. No ankle fractures. Hindfoot and midfoot degenerative changes are noted. IMPRESSION:  Mildly displaced distal tibial shaft fracture. Electronically Signed   By: Marijo Sanes M.D.   On: 12/06/2019 15:56   ECHOCARDIOGRAM COMPLETE  Result Date: 12/07/2019    ECHOCARDIOGRAM REPORT   Patient Name:   Amber [name] Date of Exam: 12/06/2019 Medical Rec #:  [id number]           Height:       58.0 in Accession #:    [id number]          Weight:       170.0 lb Date of Birth:  05/22/1930          BSA:          1.700 m Patient Age:    86 years            BP:           112/94 mmHg Patient Gender: F                   HR:           70 bpm. Exam Location:  [facility] Procedure: 2D Echo, Cardiac Doppler, Color Doppler and Intracardiac            Opacification Agent Indications:     Preoperative evaluation                  R55 Syncope and Collapse  History:         Patient has prior history of Echocardiogram examinations, most                  recent 01/27/2017. Pacemaker; Risk Factors:Hypertension,                  Diabetes, Dyslipidemia and Obesity. Shortness of breath.                  Chronic kidney disease. Coronary artery disease.  Sonographer:     Wilford Sports Rodgers-Jones Referring Phys:  [id number] [place] Diagnosing Phys: Isaias Cowman MD  Sonographer Comments: Technically difficult study due to poor echo windows. IMPRESSIONS  1. Left ventricular ejection fraction, by estimation, is 55 to 60%. The left ventricle has normal function. The left ventricle has no regional wall motion abnormalities. There is mild left ventricular hypertrophy. Left ventricular diastolic parameters are indeterminate.  2. Right ventricular systolic function is normal. The right ventricular size is normal. There is normal pulmonary artery systolic pressure.  3. Left atrial size was moderately dilated.  4. The mitral valve is normal in structure. Mild mitral valve regurgitation. No evidence of mitral stenosis.  5. The aortic valve is normal in structure. Aortic valve regurgitation is not visualized. Mild to moderate aortic  valve stenosis.  6. The inferior vena cava is normal in size with greater than 50% respiratory variability, suggesting right atrial pressure of 3 mmHg. FINDINGS  Left Ventricle: Left ventricular ejection fraction, by estimation, is 55 to 60%. The left ventricle has normal function. The left ventricle has no regional wall motion abnormalities. Definity contrast agent was given IV to delineate the left ventricular  endocardial borders. The left ventricular internal cavity size was normal in size. There is mild  left ventricular hypertrophy. Left ventricular diastolic parameters are indeterminate. Right Ventricle: The right ventricular size is normal. No increase in right ventricular wall thickness. Right ventricular systolic function is normal. There is normal pulmonary artery systolic pressure. The tricuspid regurgitant velocity is 2.21 m/s, and  with an assumed right atrial pressure of 10 mmHg, the estimated right ventricular systolic pressure is 99991111 mmHg. Left Atrium: Left atrial size was moderately dilated. Right Atrium: Right atrial size was normal in size. Pericardium: There is no evidence of pericardial effusion. Mitral Valve: The mitral valve is normal in structure. Normal mobility of the mitral valve leaflets. Mild mitral valve regurgitation. No evidence of mitral valve stenosis. Tricuspid Valve: The tricuspid valve is normal in structure. Tricuspid valve regurgitation is mild . No evidence of tricuspid stenosis. Aortic Valve: The aortic valve is normal in structure. Aortic valve regurgitation is not visualized. Mild to moderate aortic stenosis is present. Aortic valve mean gradient measures 12.0 mmHg. Aortic valve peak gradient measures 21.9 mmHg. Aortic valve area, by VTI measures 0.80 cm. Pulmonic Valve: The pulmonic valve was normal in structure. Pulmonic valve regurgitation is not visualized. No evidence of pulmonic stenosis. Aorta: The aortic root is normal in size and structure. Venous: The inferior  vena cava is normal in size with greater than 50% respiratory variability, suggesting right atrial pressure of 3 mmHg. IAS/Shunts: No atrial level shunt detected by color flow Doppler.  LEFT VENTRICLE PLAX 2D LVIDd:         4.47 cm LVIDs:         3.17 cm LV PW:         1.07 cm LV IVS:        1.28 cm LVOT diam:     1.70 cm LV SV:         35 LV SV Index:   21 LVOT Area:     2.27 cm  RIGHT VENTRICLE RV Basal diam:  3.53 cm LEFT ATRIUM             Index       RIGHT ATRIUM           Index LA diam:        3.90 cm 2.29 cm/m  RA Area:     16.30 cm LA Vol (A2C):   67.1 ml 39.48 ml/m RA Volume:   46.70 ml  27.48 ml/m LA Vol (A4C):   80.4 ml 47.31 ml/m LA Biplane Vol: 80.4 ml 47.31 ml/m  AORTIC VALVE AV Area (Vmax):    0.82 cm AV Area (Vmean):   0.77 cm AV Area (VTI):     0.80 cm AV Vmax:           233.75 cm/s AV Vmean:          164.000 cm/s AV VTI:            0.434 m AV Peak Grad:      21.9 mmHg AV Mean Grad:      12.0 mmHg LVOT Vmax:         84.40 cm/s LVOT Vmean:        55.900 cm/s LVOT VTI:          0.154 m LVOT/AV VTI ratio: 0.35  AORTA Ao Root diam: 2.90 cm MV E velocity: 88.77 cm/s  TRICUSPID VALVE                            TR Peak grad:  19.5 mmHg                            TR Vmax:        221.00 cm/s                             SHUNTS                            Systemic VTI:  0.15 m                            Systemic Diam: 1.70 cm Isaias Cowman MD Electronically signed by Isaias Cowman MD Signature Date/Time: 12/07/2019/1:33:06 PM    Final    ASSESSMENT AND PLAN:  Amber [name] is a 86 y.o. female with a known history of anemia, anxiety, arthritis, CHF, chronic kidney disease, coronary artery disease, diabetes, hypertension, hyperlipidemia, PPM presents to the emergency department for evaluation of syncope.  Patient was in a usual state of health until this morning when she was getting dressed she believes she lost consciousness, unclear for how long she was down but woke up on the  floor.  #. Syncope--unclear etiology -  telemetry monitoring--HR 55-60 /min - received IV fluid hydration -BP dropped with narcotics--recovered with IVF -seen by Dr Josefa Half -family did not witness any seizures  #.  Mildly displaced right distal tibial shaft fracture Management per Ortho dr menz--scheduled for hardware removal and ORIF right tibial fracture 6/3 -Pain control -Bedrest for now -N.p.o. after midnight -Hold Eliquis for possible OR in a.m.  #. H/o Diabetes,well controlled - Accuchecks achs with RISS coverage - Heart healthy, carb controlled diet and NPO after midnight -Hold 70/30 for now -cont SSI  #. History of hypertension - on  Norvasc, Coreg, Lasix, Entresto at home -holding it today due to drop in BP  #. History of hyperlipidemia - Continue pravastatin  #. History of GERD - Continue Protonix  # Seizure disorder -cont keppra as before  Admission status: Inpatient  Diet/Nutrition: Heart healthy, carb controlled, n.p.o. after midnight Consults called: cardiology-[facility] DVT Px:  Early ambulation and resume eliquis when ok with ortho Code Status: Full Code  Disposition Plan: To be determined  Status is: Inpatient  Remains inpatient appropriate because:Inpatient level of care appropriate due to severity of illness   Dispo: The patient is from: Home              Anticipated d/c is to: TBD              Anticipated d/c date is: 3 days              Patient currently is not medically stable to d/c. Planned for ORIF right LE and thereafter PT and d/c planning   TOTAL TIME TAKING CARE OF THIS PATIENT: 35 minutes.  >50% time spent on counselling and coordination of care  Note: This dictation was prepared with Dragon dictation along with smaller phrase technology. Any transcriptional errors that result from this process are unintentional.  Fritzi Mandes M.D    [facility]   CC: Primary care physician; Idelle Crouch, MDPatient ID: Amber [name], female   DOB: 1930/03/26, 86 y.o.   MRN: [id number]

## 2019-12-07 NOTE — Consult Note (Signed)
Reason for Consult: Right tibia and fibula fractures Referring Physician: Dr. Modena Jansky Margaretmary Eddy Amber [name] is an 86 y.o. female.  HPI: Patient is a 86 year old female who suffered an episode of syncope unwitnessed fall and suffered a right leg injury.  She has a prior history of open right ankle fracture treated with ORIF about 6 years ago and remote history of right total knee by Dr. Jefm Bryant.  She denies any prodromal symptoms or any prior leg pain has not really been having difficulty with the right leg.  She has also had prior hip replacement.  She is normally Hydrographic surveyor  Past Medical History:  Diagnosis Date  . Anemia   . Anxiety   . Arthritis   . Benign neoplasm of colon   . CHF (congestive heart failure) ([place])   . Chronic kidney disease   . Chronic pain   . Complication of anesthesia 1980's   hard time waking up  . Coronary artery disease with unspecified angina pectoris   . Cough   . Diabetes mellitus without complication ([place])   . Essential hypertension   . High risk medication use   . Hyperlipidemia   . Plantar fascial fibromatosis   . Presence of permanent cardiac pacemaker   . Shortness of breath dyspnea     Past Surgical History:  Procedure Laterality Date  . ABDOMINAL HYSTERECTOMY    . BACK SURGERY  1960's   cage and screws in lower back  . CATARACT EXTRACTION W/ INTRAOCULAR LENS  IMPLANT, BILATERAL Bilateral   . CORONARY ARTERY BYPASS GRAFT  2008   triple  . JOINT REPLACEMENT Right    hip and knee  . ORIF ANKLE FRACTURE Right 07/27/2014   Procedure: OPEN REDUCTION INTERNAL FIXATION (ORIF) ANKLE FRACTURE;  Surgeon: Alta Corning, MD;  Location: [place];  Service: Orthopedics;  Laterality: Right;  . PACEMAKER INSERTION Left 03/14/2015   Procedure: INSERTION PACEMAKER;  Surgeon: Isaias Cowman, MD;  Location: [facility];  Service: Cardiovascular;  Laterality: Left;  . ROTATOR CUFF REPAIR Left   . TOTAL HIP ARTHROPLASTY Right   . TOTAL HIP ARTHROPLASTY  Left 02/09/2018   Procedure: TOTAL HIP ARTHROPLASTY ANTERIOR APPROACH;  Surgeon: Hessie Knows, MD;  Location: [facility];  Service: Orthopedics;  Laterality: Left;  . TOTAL KNEE ARTHROPLASTY Right   . VEIN LIGATION AND STRIPPING      Family History  Problem Relation Age of Onset  . Heart attack Mother   . Heart disease Father   . Alzheimer's disease Sister   . Cervical cancer Sister   . Heart failure Son     Social History:  reports that she has never smoked. She has never used smokeless tobacco. She reports that she does not drink alcohol or use drugs.  Allergies:  Allergies  Allergen Reactions  . Ambien [Zolpidem Tartrate] Other (See Comments)    Reaction:  Keeps pt awake   . Penicillins Itching and Other (See Comments)    Has patient had a PCN reaction causing immediate rash, facial/tongue/throat swelling, SOB or lightheadedness with hypotension: No Has patient had a PCN reaction causing severe rash involving mucus membranes or skin necrosis: No Has patient had a PCN reaction that required hospitalization No Has patient had a PCN reaction occurring within the last 10 years: No If all of the above answers are "NO", then may proceed with Cephalosporin use.  . Iodine Itching  . Succinylcholine Other (See Comments)    Reaction:  Unknown   . Etodolac  Other (See Comments)    GI upset  . Nsaids Other (See Comments)    Reaction: gi upset    Medications: I have reviewed the patient's current medications.  Results for orders placed or performed during the hospital encounter of 12/06/19 (from the past 48 hour(s))  Basic metabolic panel     Status: Abnormal   Collection Time: 12/06/19  1:21 PM  Result Value Ref Range   Sodium 139 135 - 145 mmol/L   Potassium 4.8 3.5 - 5.1 mmol/L   Chloride 102 98 - 111 mmol/L   CO2 28 22 - 32 mmol/L   Glucose, Bld 132 (H) 70 - 99 mg/dL    Comment: Glucose reference range applies only to samples taken after fasting for at least 8 hours.   BUN 26  (H) 8 - 23 mg/dL   Creatinine, Ser 1.07 (H) 0.44 - 1.00 mg/dL   Calcium 9.9 8.9 - 10.3 mg/dL   GFR calc non Af Amer 47 (L) >60 mL/min   GFR calc Af Amer 54 (L) >60 mL/min   Anion gap 9 5 - 15    Comment: Performed at [facility], [address] [place], [place] [address]  CBC     Status: Abnormal   Collection Time: 12/06/19  1:21 PM  Result Value Ref Range   WBC 10.6 (H) 4.0 - 10.5 K/uL   RBC 4.24 3.87 - 5.11 MIL/uL   Hemoglobin 12.7 12.0 - 15.0 g/dL   HCT 37.3 36.0 - 46.0 %   MCV 88.0 80.0 - 100.0 fL   MCH 30.0 26.0 - 34.0 pg   MCHC 34.0 30.0 - 36.0 g/dL   RDW 14.7 11.5 - 15.5 %   Platelets 238 150 - 400 K/uL   nRBC 0.0 0.0 - 0.2 %    Comment: Performed at [facility], [place], [place] [address]  Troponin I (High Sensitivity)     Status: None   Collection Time: 12/06/19  1:21 PM  Result Value Ref Range   Troponin I (High Sensitivity) 8 <18 ng/L    Comment: (NOTE) Elevated high sensitivity troponin I (hsTnI) values and significant  changes across serial measurements may suggest ACS but many other  chronic and acute conditions are known to elevate hsTnI results.  Refer to the "Links" section for chest pain algorithms and additional  guidance. Performed at [facility], [address] [place], [place] [address]   Urinalysis, Complete w Microscopic     Status: Abnormal   Collection Time: 12/06/19  6:29 PM  Result Value Ref Range   Color, Urine YELLOW (A) YELLOW   APPearance CLEAR (A) CLEAR   Specific Gravity, Urine 1.016 1.005 - 1.030   pH 5.0 5.0 - 8.0   Glucose, UA NEGATIVE NEGATIVE mg/dL   Hgb urine dipstick NEGATIVE NEGATIVE   Bilirubin Urine NEGATIVE NEGATIVE   Ketones, ur NEGATIVE NEGATIVE mg/dL   Protein, ur NEGATIVE NEGATIVE mg/dL   Nitrite NEGATIVE NEGATIVE   Leukocytes,Ua NEGATIVE NEGATIVE   RBC / HPF 0-5 0 - 5 RBC/hpf   WBC, UA 0-5 0 - 5 WBC/hpf   Bacteria, UA RARE (A) NONE SEEN   Squamous Epithelial / LPF  0-5 0 - 5   Hyaline Casts, UA PRESENT     Comment: Performed at [facility], [address] [place],  [address]  SARS Coronavirus 2 by RT PCR (hospital order, performed in [facility] hospital lab) Nasopharyngeal Nasopharyngeal Swab  Status: None   Collection Time: 12/06/19  6:29 PM   Specimen: Nasopharyngeal Swab  Result Value Ref Range   SARS Coronavirus 2 NEGATIVE NEGATIVE    Comment: (NOTE) SARS-CoV-2 target nucleic acids are NOT DETECTED. The SARS-CoV-2 RNA is generally detectable in upper and lower respiratory specimens during the acute phase of infection. The lowest concentration of SARS-CoV-2 viral copies this assay can detect is 250 copies / mL. A negative result does not preclude SARS-CoV-2 infection and should not be used as the sole basis for treatment or other patient management decisions.  A negative result may occur with improper specimen collection / handling, submission of specimen other than nasopharyngeal swab, presence of viral mutation(s) within the areas targeted by this assay, and inadequate number of viral copies (<250 copies / mL). A negative result must be combined with clinical observations, patient history, and epidemiological information. Fact Sheet for Patients:   StrictlyIdeas.no Fact Sheet for Healthcare Providers: BankingDealers.co.za This test is not yet approved or cleared  by the [place] FDA and has been authorized for detection and/or diagnosis of SARS-CoV-2 by FDA under an Emergency Use Authorization (EUA).  This EUA will remain in effect (meaning this test can be used) for the duration of the COVID-19 declaration under Section 564(b)(1) of the Act, 21 U.S.C. section 360bbb-3(b)(1), unless the authorization is terminated or revoked sooner. Performed at [facility], [address] [place], [place] [address]   Glucose, capillary     Status: Abnormal    Collection Time: 12/06/19  8:58 PM  Result Value Ref Range   Glucose-Capillary 107 (H) 70 - 99 mg/dL    Comment: Glucose reference range applies only to samples taken after fasting for at least 8 hours.  Hemoglobin A1c     Status: Abnormal   Collection Time: 12/06/19  8:59 PM  Result Value Ref Range   Hgb A1c MFr Bld 7.4 (H) 4.8 - 5.6 %    Comment: (NOTE) Pre diabetes:          5.7%-6.4% Diabetes:              >6.4% Glycemic control for   <7.0% adults with diabetes    Mean Plasma Glucose 165.68 mg/dL    Comment: Performed at [facility] [address], [place], [place] [address]  Troponin I (High Sensitivity)     Status: None   Collection Time: 12/06/19  8:59 PM  Result Value Ref Range   Troponin I (High Sensitivity) 9 <18 ng/L    Comment: (NOTE) Elevated high sensitivity troponin I (hsTnI) values and significant  changes across serial measurements may suggest ACS but many other  chronic and acute conditions are known to elevate hsTnI results.  Refer to the "Links" section for chest pain algorithms and additional  guidance. Performed at [facility], [address] [place], [place] [address]   Protime-INR     Status: Abnormal   Collection Time: 12/06/19  8:59 PM  Result Value Ref Range   Prothrombin Time 16.2 (H) 11.4 - 15.2 seconds   INR 1.4 (H) 0.8 - 1.2    Comment: (NOTE) INR goal varies based on device and disease states. Performed at [facility], [address] [place],  [address]   APTT     Status: None   Collection Time: 12/06/19  8:59 PM  Result Value Ref Range   aPTT 36 24 - 36 seconds    Comment: Performed at [facility], [place]  [address], [place], [place] [address]  TSH     Status: None   Collection Time: 12/06/19  8:59 PM  Result Value Ref Range   TSH 2.195 0.350 - 4.500 uIU/mL    Comment: Performed by a 3rd Generation assay with a functional sensitivity of <=0.01 uIU/mL. Performed at [facility],  [place], [place] [address]   Heparin level (unfractionated)     Status: Abnormal   Collection Time: 12/06/19  9:10 PM  Result Value Ref Range   Heparin Unfractionated >3.60 (H) 0.30 - 0.70 IU/mL    Comment: RESULTS CONFIRMED BY MANUAL DILUTION (NOTE) If heparin results are below expected values, and patient dosage has  been confirmed, suggest follow up testing of antithrombin III levels. Performed at [facility], [address] [place], [place] [address]   Glucose, capillary     Status: Abnormal   Collection Time: 12/07/19 12:42 AM  Result Value Ref Range   Glucose-Capillary 140 (H) 70 - 99 mg/dL    Comment: Glucose reference range applies only to samples taken after fasting for at least 8 hours.  Glucose, capillary     Status: Abnormal   Collection Time: 12/07/19  4:35 AM  Result Value Ref Range   Glucose-Capillary 61 (L) 70 - 99 mg/dL    Comment: Glucose reference range applies only to samples taken after fasting for at least 8 hours.  Lipid panel     Status: Abnormal   Collection Time: 12/07/19  4:37 AM  Result Value Ref Range   Cholesterol 164 0 - 200 mg/dL   Triglycerides 68 <150 mg/dL   HDL 43 >40 mg/dL   Total CHOL/HDL Ratio 3.8 RATIO   VLDL 14 0 - 40 mg/dL   LDL Cholesterol 107 (H) 0 - 99 mg/dL    Comment:        Total Cholesterol/HDL:CHD Risk Coronary Heart Disease Risk Table                     Men   Women  1/2 Average Risk   3.4   3.3  Average Risk       5.0   4.4  2 X Average Risk   9.6   7.1  3 X Average Risk  23.4   11.0        Use the calculated Patient Ratio above and the CHD Risk Table to determine the patient's CHD Risk.        ATP III CLASSIFICATION (LDL):  <100     mg/dL   Optimal  100-129  mg/dL   Near or Above                    Optimal  130-159  mg/dL   Borderline  160-189  mg/dL   High  >190     mg/dL   Very High Performed at [facility], [address] [place], [place] [id number]   Basic metabolic  panel     Status: Abnormal   Collection Time: 12/07/19  4:37 AM  Result Value Ref Range   Sodium 139 135 - 145 mmol/L   Potassium 3.8 3.5 - 5.1 mmol/L   Chloride 105 98 - 111 mmol/L   CO2 25 22 - 32 mmol/L   Glucose, Bld 69 (L) 70 - 99 mg/dL    Comment: Glucose reference range applies only to samples taken after fasting for at least 8 hours.   BUN 29 (H) 8 -  23 mg/dL   Creatinine, Ser 0.92 0.44 - 1.00 mg/dL   Calcium 9.2 8.9 - 10.3 mg/dL   GFR calc non Af Amer 56 (L) >60 mL/min   GFR calc Af Amer >60 >60 mL/min   Anion gap 9 5 - 15    Comment: Performed at [facility], [address] [place], [place] [address]  CBC     Status: Abnormal   Collection Time: 12/07/19  4:37 AM  Result Value Ref Range   WBC 8.2 4.0 - 10.5 K/uL   RBC 3.92 3.87 - 5.11 MIL/uL   Hemoglobin 11.5 (L) 12.0 - 15.0 g/dL   HCT 35.7 (L) 36.0 - 46.0 %   MCV 91.1 80.0 - 100.0 fL   MCH 29.3 26.0 - 34.0 pg   MCHC 32.2 30.0 - 36.0 g/dL   RDW 14.7 11.5 - 15.5 %   Platelets 222 150 - 400 K/uL   nRBC 0.0 0.0 - 0.2 %    Comment: Performed at [facility], [address] [place], [place] [address]  Protime-INR     Status: Abnormal   Collection Time: 12/07/19  4:37 AM  Result Value Ref Range   Prothrombin Time 16.3 (H) 11.4 - 15.2 seconds   INR 1.4 (H) 0.8 - 1.2    Comment: (NOTE) INR goal varies based on device and disease states. Performed at [facility], [place], [place] [address]   Troponin I (High Sensitivity)     Status: None   Collection Time: 12/07/19  4:37 AM  Result Value Ref Range   Troponin I (High Sensitivity) 10 <18 ng/L    Comment: (NOTE) Elevated high sensitivity troponin I (hsTnI) values and significant  changes across serial measurements may suggest ACS but many other  chronic and acute conditions are known to elevate hsTnI results.  Refer to the "Links" section for chest pain algorithms and additional  guidance. Performed at [facility], [address] [place], [place] [address]   Glucose, capillary     Status: Abnormal   Collection Time: 12/07/19  5:28 AM  Result Value Ref Range   Glucose-Capillary 111 (H) 70 - 99 mg/dL    Comment: Glucose reference range applies only to samples taken after fasting for at least 8 hours.  Glucose, capillary     Status: Abnormal   Collection Time: 12/07/19  7:30 AM  Result Value Ref Range   Glucose-Capillary 104 (H) 70 - 99 mg/dL    Comment: Glucose reference range applies only to samples taken after fasting for at least 8 hours.    DG Ankle Complete Right  Result Date: 12/06/2019 CLINICAL DATA:  [place].  Injured leg. EXAM: RIGHT ANKLE - COMPLETE 3+ VIEW COMPARISON:  None. FINDINGS: There is a mildly displaced fracture involving the distal tibial shaft above the level of the ankle fracture fixation hardware. Approximately the 2 cortex width of lateral displacement and 1 cortex with of anterior displacement. The medial and lateral ankle fixation hardware is intact. No ankle fractures. Hindfoot and midfoot degenerative changes are noted. IMPRESSION: Mildly displaced distal tibial shaft fracture. Electronically Signed   By: Marijo Sanes M.D.   On: 12/06/2019 15:56    Review of Systems Blood pressure 134/64, pulse (!) 53, temperature 97.6 F (36.4 C), temperature source Oral, resp. rate 14, height 4\' 10"  (1.473 m), weight 77.1 kg, SpO2 99 %. Physical Exam her right leg is in a splint and is neurovascularly intact.  She has a  prior anterior midline knee incision with scar healed from prior total knee Radiographic review reveals a spiral tibia fracture with presumed to more proximal fibula fracture.  There is retained hardware with a third tubular plate along the medial side of the ankle that will need to be removed.  Assessment/Plan: Spiral fracture tibia fracture distal third with prior hardware to the medial ankle Plan is for hardware removal and ORIF tomorrow after she been off her  Eliquis adequate length of time.  Risks of surgery discussed.  She will be partial weightbearing postoperatively and will prior require rehabilitation stay.  Hessie Knows 12/07/2019, 8:02 AM

## 2019-12-07 NOTE — ED Notes (Addendum)
Spoke with Posey Pronto MD about BP. Pt given a 500cc bolus. Pt is alert and talking now when nurse wakes. New EKG performed with BP low and heart rate at 60. Per MD patel, hold all BP/cardiac meds and narcotics.

## 2019-12-07 NOTE — Progress Notes (Addendum)
[place] for heparin Indication: atrial fibrillation  Allergies  Allergen Reactions  . Ambien [Zolpidem Tartrate] Other (See Comments)    Reaction:  Keeps pt awake   . Penicillins Itching and Other (See Comments)    Has patient had a PCN reaction causing immediate rash, facial/tongue/throat swelling, SOB or lightheadedness with hypotension: No Has patient had a PCN reaction causing severe rash involving mucus membranes or skin necrosis: No Has patient had a PCN reaction that required hospitalization No Has patient had a PCN reaction occurring within the last 10 years: No If all of the above answers are "NO", then may proceed with Cephalosporin use.  . Iodine Itching  . Succinylcholine Other (See Comments)    Reaction:  Unknown   . Etodolac Other (See Comments)    GI upset  . Nsaids Other (See Comments)    Reaction: gi upset    Patient Measurements: Height: 4\' 10"  (147.3 cm) Weight: 77.1 kg (170 lb) IBW/kg (Calculated) : 40.9 Heparin Dosing Weight: 58.9 kg  Vital Signs: BP: 134/64 (06/02 0800) Pulse Rate: 53 (06/02 0800)  Labs: Recent Labs    12/06/19 1321 12/06/19 2059 12/06/19 2110 12/07/19 0437 12/07/19 0734  HGB 12.7  --   --  11.5*  --   HCT 37.3  --   --  35.7*  --   PLT 238  --   --  222  --   APTT  --  36  --   --  61*  LABPROT  --  16.2*  --  16.3*  --   INR  --  1.4*  --  1.4*  --   HEPARINUNFRC  --   --  >3.60*  --   --   CREATININE 1.07*  --   --  0.92  --   TROPONINIHS 8 9  --  10  --     Estimated Creatinine Clearance: 37.7 mL/min (by C-G formula based on SCr of 0.92 mg/dL).   Medical History: Past Medical History:  Diagnosis Date  . Anemia   . Anxiety   . Arthritis   . Benign neoplasm of colon   . CHF (congestive heart failure) ([place])   . Chronic kidney disease   . Chronic pain   . Complication of anesthesia 1980's   hard time waking up  . Coronary artery disease with unspecified angina pectoris    . Cough   . Diabetes mellitus without complication ([place])   . Essential hypertension   . High risk medication use   . Hyperlipidemia   . Plantar fascial fibromatosis   . Presence of permanent cardiac pacemaker   . Shortness of breath dyspnea     Medications:  Scheduled:  . amLODipine  5 mg Oral Daily  . carvedilol  6.25 mg Oral BID WC  . furosemide  20 mg Oral Daily  . gabapentin  200 mg Oral TID  . insulin aspart  0-15 Units Subcutaneous Q4H  . melatonin  5 mg Oral QHS  . pantoprazole  40 mg Oral Daily  . pravastatin  40 mg Oral QHS  . sacubitril-valsartan  1 tablet Oral BID  . sodium chloride flush  3 mL Intravenous Once    Assessment: Patient admitted for LOC w/ R hip dislocation s/p reduction in ED. Patient has a h/o CHF, afib anticoagulated w/ eliquis 5 mg bid PTA, CKD III, is being started on a heparin drip for R hip arthroplasty in OR procedure in the am.  Last known eliquis dose per patient was 06/01 @ 0800. Baseline labs WNL, INR slightly elevated at 1.4, baseline anti-Xa pending.  06/01 2059 APTT 36 06/02 0734 APTT 61, SUBtherapeutic  Goal of Therapy:  Heparin level 0.3-0.7 units/ml  APTT 66 - 102 seconds Monitor platelets by anticoagulation protocol: Yes   Plan:  Will order heparin bolus for 1000 units. Increase heparin infusion to 750 units/hr. Will monitor CBC and  recheck APTT in 8 hours and adjust dose as indicated.   Durward Fortes PharmD-Student  12/07/2019,8:14 AM

## 2019-12-07 NOTE — Anesthesia Preprocedure Evaluation (Addendum)
Anesthesia Evaluation  Patient identified by MRN, date of birth, ID band Patient awake    Reviewed: Allergy & Precautions, NPO status , Patient's Chart, lab work & pertinent test results  History of Anesthesia Complications (+) history of anesthetic complications (? reaction to sux)  Airway Mallampati: III       Dental   Pulmonary neg sleep apnea, COPD,  COPD inhaler, Not current smoker,           Cardiovascular hypertension, Pt. on medications + CABG and +CHF  (-) CAD and (-) Past MI + pacemaker (-) Valvular Problems/Murmurs     Neuro/Psych neg Seizures Anxiety TIA   GI/Hepatic Neg liver ROS, neg GERD  ,  Endo/Other  diabetes, Type 2, Oral Hypoglycemic Agents  Renal/GU Renal InsufficiencyRenal disease     Musculoskeletal   Abdominal   Peds  Hematology   Anesthesia Other Findings   Reproductive/Obstetrics                            Anesthesia Physical Anesthesia Plan  ASA: III  Anesthesia Plan: General   Post-op Pain Management:    Induction: Intravenous  PONV Risk Score and Plan: 3 and Ondansetron and Dexamethasone  Airway Management Planned: Oral ETT  Additional Equipment:   Intra-op Plan:   Post-operative Plan:   Informed Consent: I have reviewed the patients History and Physical, chart, labs and discussed the procedure including the risks, benefits and alternatives for the proposed anesthesia with the patient or authorized representative who has indicated his/her understanding and acceptance.       Plan Discussed with:   Anesthesia Plan Comments:         Anesthesia Quick Evaluation

## 2019-12-07 NOTE — Progress Notes (Signed)
ANTICOAGULATION CONSULT NOTE - Initial Consult  Pharmacy Consult for heparin Indication: atrial fibrillation  Allergies  Allergen Reactions  . Ambien [Zolpidem Tartrate] Other (See Comments)    Reaction:  Keeps pt awake   . Penicillins Itching and Other (See Comments)    Has patient had a PCN reaction causing immediate rash, facial/tongue/throat swelling, SOB or lightheadedness with hypotension: No Has patient had a PCN reaction causing severe rash involving mucus membranes or skin necrosis: No Has patient had a PCN reaction that required hospitalization No Has patient had a PCN reaction occurring within the last 10 years: No If all of the above answers are "NO", then may proceed with Cephalosporin use.  . Iodine Itching  . Succinylcholine Other (See Comments)    Reaction:  Unknown   . Etodolac Other (See Comments)    GI upset  . Nsaids Other (See Comments)    Reaction: gi upset    Patient Measurements: Height: 4\' 10"  (147.3 cm) Weight: 77.1 kg (170 lb) IBW/kg (Calculated) : 40.9 Heparin Dosing Weight: 86 kg  Vital Signs: BP: 121/78 (06/02 0200) Pulse Rate: 59 (06/02 0200)  Labs: Recent Labs    12/06/19 1321 12/06/19 2059 12/06/19 2110  HGB 12.7  --   --   HCT 37.3  --   --   PLT 238  --   --   APTT  --  36  --   LABPROT  --  16.2*  --   INR  --  1.4*  --   HEPARINUNFRC  --   --  >3.60*  CREATININE 1.07*  --   --   TROPONINIHS 8 9  --     Estimated Creatinine Clearance: 32.4 mL/min (A) (by C-G formula based on SCr of 1.07 mg/dL (H)).   Medical History: Past Medical History:  Diagnosis Date  . Anemia   . Anxiety   . Arthritis   . Benign neoplasm of colon   . CHF (congestive heart failure) ([place])   . Chronic kidney disease   . Chronic pain   . Complication of anesthesia 1980's   hard time waking up  . Coronary artery disease with unspecified angina pectoris   . Cough   . Diabetes mellitus without complication ([place])   . Essential hypertension   . High  risk medication use   . Hyperlipidemia   . Plantar fascial fibromatosis   . Presence of permanent cardiac pacemaker   . Shortness of breath dyspnea     Medications:  Scheduled:  . insulin aspart  0-15 Units Subcutaneous Q4H  . melatonin  5 mg Oral QHS  . sodium chloride flush  3 mL Intravenous Once    Assessment: Patient admitted for LOC w/ R hip dislocation s/p reduction in ED. Patient has a h/o CHF, afib anticoagulated w/ eliquis 5 mg bid PTA, CKD III, is being started on a heparin drip for R hip arthroplasty in OR procedure in the am. Last known eliquis dose per patient was 06/01 @ 0800. Baseline labs WNL, INR slightly elevated at 1.4, baseline anti-Xa pending.  Goal of Therapy:  Heparin level 0.3-0.7 units/ml  APTT 66 - 102 seconds Monitor platelets by anticoagulation protocol: Yes   Plan:  06/02 @ 0200 anti-Xa > 3.60 probably drawn while heparin drip was running. Will dose off of aPTT for now and checking at 0730 and check an anti-Xa w/ am labs to assess correlation.  Tobie Lords, PharmD, BCPS Clinical Pharmacist 12/07/2019,2:39 AM

## 2019-12-07 NOTE — Progress Notes (Signed)
[place] for heparin Indication: atrial fibrillation  Allergies  Allergen Reactions  . Ambien [Zolpidem Tartrate] Other (See Comments)    Reaction:  Keeps pt awake   . Penicillins Itching and Other (See Comments)    Has patient had a PCN reaction causing immediate rash, facial/tongue/throat swelling, SOB or lightheadedness with hypotension: No Has patient had a PCN reaction causing severe rash involving mucus membranes or skin necrosis: No Has patient had a PCN reaction that required hospitalization No Has patient had a PCN reaction occurring within the last 10 years: No If all of the above answers are "NO", then may proceed with Cephalosporin use.  . Iodine Itching  . Succinylcholine Other (See Comments)    Reaction:  Unknown   . Etodolac Other (See Comments)    GI upset  . Nsaids Other (See Comments)    Reaction: gi upset    Patient Measurements: Height: 4\' 10"  (147.3 cm) Weight: 77.1 kg (170 lb) IBW/kg (Calculated) : 40.9 Heparin Dosing Weight: 58.9 kg  Vital Signs: Temp: 98.2 F (36.8 C) (06/02 1618) Temp Source: Oral (06/02 1618) BP: 148/61 (06/02 1618) Pulse Rate: 59 (06/02 1618)  Labs: Recent Labs    12/06/19 1321 12/06/19 2059 12/06/19 2110 12/07/19 0437 12/07/19 0734 12/07/19 1830  HGB 12.7  --   --  11.5*  --   --   HCT 37.3  --   --  35.7*  --   --   PLT 238  --   --  222  --   --   APTT  --  36  --   --  61* 116*  LABPROT  --  16.2*  --  16.3*  --   --   INR  --  1.4*  --  1.4*  --   --   HEPARINUNFRC  --   --  >3.60*  --   --   --   CREATININE 1.07*  --   --  0.92  --   --   TROPONINIHS 8 9  --  10  --   --     Estimated Creatinine Clearance: 37.7 mL/min (by C-G formula based on SCr of 0.92 mg/dL).   Medical History: Past Medical History:  Diagnosis Date  . Anemia   . Anxiety   . Arthritis   . Benign neoplasm of colon   . CHF (congestive heart failure) ([place])   . Chronic kidney disease   . Chronic  pain   . Complication of anesthesia 1980's   hard time waking up  . Coronary artery disease with unspecified angina pectoris   . Cough   . Diabetes mellitus without complication ([place])   . Essential hypertension   . High risk medication use   . Hyperlipidemia   . Plantar fascial fibromatosis   . Presence of permanent cardiac pacemaker   . Shortness of breath dyspnea     Medications:  Scheduled:  . amLODipine  5 mg Oral Daily  . carvedilol  6.25 mg Oral BID WC  . furosemide  20 mg Oral Daily  . gabapentin  200 mg Oral TID  . insulin aspart  0-5 Units Subcutaneous QHS  . insulin aspart  0-9 Units Subcutaneous TID WC  . levETIRAcetam  500 mg Oral BID  . melatonin  5 mg Oral QHS  . pantoprazole  40 mg Oral Daily  . pravastatin  40 mg Oral QHS  . sacubitril-valsartan  1 tablet Oral BID  .  sodium chloride flush  3 mL Intravenous Once    Assessment: Patient admitted for LOC w/ R hip dislocation s/p reduction in ED. Patient has a h/o CHF, afib anticoagulated w/ eliquis 5 mg bid PTA, CKD III, is being started on a heparin drip for R hip arthroplasty in OR procedure in the am. Last known eliquis dose per patient was 06/01 @ 0800. Baseline labs WNL, INR slightly elevated at 1.4, baseline anti-Xa pending.  06/01 2059 APTT 36 06/02 0734 APTT 61, SUBtherapeutic 06/02 1830 APTT 116 - level was not ordered - ordered a STAT aPTT.   Goal of Therapy:  Heparin level 0.3-0.7 units/ml  APTT 66 - 102 seconds Monitor platelets by anticoagulation protocol: Yes   Plan:  APTT was supratherapeutic. Will decrease heparin infusion to 700 units/hr. Will follow up with aPTT level in 8 hours. HL and CBC with AM labs.   Eleonore Chiquito, PharmD, BCPS.  12/07/2019,6:54 PM

## 2019-12-07 NOTE — Consult Note (Signed)
CARDIOLOGY CONSULT NOTE               Patient ID: Amber [name] MRN: [id number] DOB/AGE: 1930/01/19 86 y.o.  Admit date: 12/06/2019 Referring Physician Fritzi Mandes, MD Primary Physician Doy Hutching, MD Primary Cardiologist Nehemiah Massed, MD Reason for Consultation syncope and collapse, preoperative cardiovascular evaluation  HPI: 86 year old female referred for evaluation of unwitnessed syncope and collapse with subsequent mildly displaced distal tibial shaft fracture, and for preoperative cardiovascular evaluation. The patient has a history of coronary artery disease, status post CABG x3, paroxysmal atrial fibrillation on Eliquis with last dose 12/06/2019 around 8AM, sick sinus syndrome status post pacemaker, ischemic cardiomyopathy and chronic systolic CHF with LVEF [id number] per echo in 2019, aortic stenosis, COPD, hypertension, and type 2 diabetes on insulin. The patient has a history of tussive syncope with last episode in 2018, also fracturing her right ankle as a result of collapse, at which time she was started on Keppra due to concern for seizures. Yesterday morning, the patient got out of bed, sat on the bedside commode, urinated, bent over to pull up her pants, and found her self waking up on the floor in front of the commode. She denies prodromal lightheadedness, dizziness, vision changes, chest pain, shortness of breath, or palpitations. She felt fine when she regained consciousness with the exception of right leg pain. Her daughter came later to her assistance. The patient was transported to [facility] ER where labs were unremarkable, including normal high sensitivity troponin x 3. ECG revealed ventricular paced rhythm at a rate of 61 bpm. Pacemaker interrogation revealed normal functioning pacemaker. Lexiscan Myoview 03/26/2017 revealed LVEF 35% without evidence of ischemia with hypokinesis of the entire myocardium. Currently, the patient reports feeling well with the exception of mild leg pain. She  denies a recent history of chest pain, peripheral edema, or worsening shortness of breath.  Review of systems complete and found to be negative unless listed above     Past Medical History:  Diagnosis Date  . Anemia   . Anxiety   . Arthritis   . Benign neoplasm of colon   . CHF (congestive heart failure) ([place])   . Chronic kidney disease   . Chronic pain   . Complication of anesthesia 1980's   hard time waking up  . Coronary artery disease with unspecified angina pectoris   . Cough   . Diabetes mellitus without complication ([place])   . Essential hypertension   . High risk medication use   . Hyperlipidemia   . Plantar fascial fibromatosis   . Presence of permanent cardiac pacemaker   . Shortness of breath dyspnea     Past Surgical History:  Procedure Laterality Date  . ABDOMINAL HYSTERECTOMY    . BACK SURGERY  1960's   cage and screws in lower back  . CATARACT EXTRACTION W/ INTRAOCULAR LENS  IMPLANT, BILATERAL Bilateral   . CORONARY ARTERY BYPASS GRAFT  2008   triple  . JOINT REPLACEMENT Right    hip and knee  . ORIF ANKLE FRACTURE Right 07/27/2014   Procedure: OPEN REDUCTION INTERNAL FIXATION (ORIF) ANKLE FRACTURE;  Surgeon: Alta Corning, MD;  Location: [facility];  Service: Orthopedics;  Laterality: Right;  . PACEMAKER INSERTION Left 03/14/2015   Procedure: INSERTION PACEMAKER;  Surgeon: Isaias Cowman, MD;  Location: [facility] ORS;  Service: Cardiovascular;  Laterality: Left;  . ROTATOR CUFF REPAIR Left   . TOTAL HIP ARTHROPLASTY Right   . TOTAL HIP ARTHROPLASTY Left 02/09/2018   Procedure: TOTAL HIP  ARTHROPLASTY ANTERIOR APPROACH;  Surgeon: Hessie Knows, MD;  Location: [facility] ORS;  Service: Orthopedics;  Laterality: Left;  . TOTAL KNEE ARTHROPLASTY Right   . VEIN LIGATION AND STRIPPING      (Not in a hospital admission)  Social History   Socioeconomic History  . Marital status: Widowed    Spouse name: Not on file  . Number of children: Not on file  . Years of education:  Not on file  . Highest education level: Not on file  Occupational History  . Not on file  Tobacco Use  . Smoking status: Never Smoker  . Smokeless tobacco: Never Used  Substance and Sexual Activity  . Alcohol use: No  . Drug use: No  . Sexual activity: Not on file  Other Topics Concern  . Not on file  Social History Narrative  . Not on file   Social Determinants of Health   Financial Resource Strain:   . Difficulty of Paying Living Expenses:   Food Insecurity:   . Worried About Charity fundraiser in the Last Year:   . Arboriculturist in the Last Year:   Transportation Needs:   . Film/video editor (Medical):   Marland Kitchen Lack of Transportation (Non-Medical):   Physical Activity:   . Days of Exercise per Week:   . Minutes of Exercise per Session:   Stress:   . Feeling of Stress :   Social Connections:   . Frequency of Communication with Friends and Family:   . Frequency of Social Gatherings with Friends and Family:   . Attends Religious Services:   . Active Member of Clubs or Organizations:   . Attends Archivist Meetings:   Marland Kitchen Marital Status:   Intimate Partner Violence:   . Fear of Current or Ex-Partner:   . Emotionally Abused:   Marland Kitchen Physically Abused:   . Sexually Abused:     Family History  Problem Relation Age of Onset  . Heart attack Mother   . Heart disease Father   . Alzheimer's disease Sister   . Cervical cancer Sister   . Heart failure Son       Review of systems complete and found to be negative unless listed above      PHYSICAL EXAM  General: Well developed, well nourished, in no acute distress, elderly female lying in bed HEENT:  Normocephalic and atramatic Neck:  No JVD.  Lungs: Clear bilaterally to auscultation, normal effort of breathing on room air. Heart: HRRR . 2/6 systolic murmur Abdomen: nondistended Msk:  Soft wrapping right lower leg Extremities: No clubbing, cyanosis or edema of left leg. Neuro: Alert and oriented X  3. Psych:  Good affect, responds appropriately  Labs:   Lab Results  Component Value Date   WBC 8.2 12/07/2019   HGB 11.5 (L) 12/07/2019   HCT 35.7 (L) 12/07/2019   MCV 91.1 12/07/2019   PLT 222 12/07/2019    Recent Labs  Lab 12/07/19 0437  NA 139  K 3.8  CL 105  CO2 25  BUN 29*  CREATININE 0.92  CALCIUM 9.2  GLUCOSE 69*   Lab Results  Component Value Date   TROPONINI <0.03 01/26/2017    Lab Results  Component Value Date   CHOL 164 12/07/2019   CHOL 177 07/26/2015   CHOL 158 07/28/2014   Lab Results  Component Value Date   HDL 43 12/07/2019   HDL 61 07/26/2015   HDL 48 07/28/2014   Lab Results  Component Value Date   LDLCALC 107 (H) 12/07/2019   LDLCALC 92 07/26/2015   LDLCALC 93 07/28/2014   Lab Results  Component Value Date   TRIG 68 12/07/2019   TRIG 121 07/26/2015   TRIG 85 07/28/2014   Lab Results  Component Value Date   CHOLHDL 3.8 12/07/2019   CHOLHDL 2.9 07/26/2015   CHOLHDL 3.3 07/28/2014   No results found for: LDLDIRECT    Radiology: DG Ankle Complete Right  Result Date: 12/06/2019 CLINICAL DATA:  [place].  Injured leg. EXAM: RIGHT ANKLE - COMPLETE 3+ VIEW COMPARISON:  None. FINDINGS: There is a mildly displaced fracture involving the distal tibial shaft above the level of the ankle fracture fixation hardware. Approximately the 2 cortex width of lateral displacement and 1 cortex with of anterior displacement. The medial and lateral ankle fixation hardware is intact. No ankle fractures. Hindfoot and midfoot degenerative changes are noted. IMPRESSION: Mildly displaced distal tibial shaft fracture. Electronically Signed   By: Marijo Sanes M.D.   On: 12/06/2019 15:56    EKG: ventricular paced rhythm, 61 bpm  ASSESSMENT AND PLAN:  1. Syncope and collapse, possible orthostatic as patient was bending over pulling up pants after urinating, without prodromal symptoms, with history of tussive syncope, for which she takes Keppra for concerns of  seizure with last syncopal episode in 2018. Labs and ECG unremarkable. Pacemaker interrogation reveals normal pacemaker function. 2. Chronic systolic CHF and ischemic cardiomyopathy with LVEF 25-30%, per echocardiogram 01/2018. Appears euvolemic. On Entresto, carvedilol, and Lasix. Denies recent history of peripheral edema, orthopnea, shortness of breath, or chest pain. 3. Paroxysmal atrial fibrillation, rate controlled on carvedilol, on Eliquis for stroke prevention with last dose 8AM 12/06/2019. Currently on heparin drip. 4. Coronary artery disease, status post MI and CABG x 3 years ago, without chest pain, with normal high sensitivity troponin x 3. 5. Preoperative cardiovascular evaluation prior to hardware removal and ORIF tomorrow. The patient is a moderate risk for serious cardiovascular complications when undergoing surgical repair of tibia fracture due to advanced age, HFrEF, and coronary artery disease, and has been medically optimized.  Recommendations: 1. Proceed with surgical management of tibia fracture if deemed medically necessary. 2. Continue carvedilol perioperatively 3. Avoid over hydrating to prevent fluid overload. 4. Resume Eliquis postoperatively as soon as safely possible. 5. Review 2D echocardiogram  Signed: Clabe Seal PA-C 12/07/2019, 8:07 AM   Discussed with Dr. Saralyn Pilar who agrees with the above plan.

## 2019-12-08 ENCOUNTER — Inpatient Hospital Stay: Payer: Medicare HMO | Admitting: Anesthesiology

## 2019-12-08 ENCOUNTER — Encounter
Admission: EM | Disposition: A | Discharge status: Skilled Nursing Facility | Payer: Self-pay | Source: Home / Self Care | Attending: Internal Medicine

## 2019-12-08 ENCOUNTER — Encounter: Payer: Self-pay | Admitting: Family Medicine

## 2019-12-08 ENCOUNTER — Inpatient Hospital Stay: Payer: Medicare HMO

## 2019-12-08 HISTORY — PX: HARDWARE REMOVAL: SHX979

## 2019-12-08 HISTORY — PX: ORIF TIBIA FRACTURE: SHX5416

## 2019-12-08 LAB — GLUCOSE, CAPILLARY
Glucose-Capillary: 136 mg/dL — ABNORMAL HIGH (ref 70–99)
Glucose-Capillary: 134 mg/dL — ABNORMAL HIGH (ref 70–99)
Glucose-Capillary: 141 mg/dL — ABNORMAL HIGH (ref 70–99)
Glucose-Capillary: 151 mg/dL — ABNORMAL HIGH (ref 70–99)
Glucose-Capillary: 189 mg/dL — ABNORMAL HIGH (ref 70–99)

## 2019-12-08 LAB — CBC
HCT: 32.4 % — ABNORMAL LOW (ref 36.0–46.0)
Hemoglobin: 10.7 g/dL — ABNORMAL LOW (ref 12.0–15.0)
MCH: 29.5 pg (ref 26.0–34.0)
MCHC: 33 g/dL (ref 30.0–36.0)
MCV: 89.3 fL (ref 80.0–100.0)
Platelets: 211 10*3/uL (ref 150–400)
RBC: 3.63 MIL/uL — ABNORMAL LOW (ref 3.87–5.11)
RDW: 14.7 % (ref 11.5–15.5)
WBC: 9.5 10*3/uL (ref 4.0–10.5)
nRBC: 0 % (ref 0.0–0.2)

## 2019-12-08 LAB — HEPARIN LEVEL (UNFRACTIONATED): Heparin Unfractionated: >3.6 IU/mL — ABNORMAL HIGH (ref 0.30–0.70)

## 2019-12-08 LAB — APTT
aPTT: 100 seconds — ABNORMAL HIGH (ref 24–36)
aPTT: 84 seconds — ABNORMAL HIGH (ref 24–36)

## 2019-12-08 SURGERY — REMOVAL, HARDWARE
Anesthesia: General | Site: Leg Lower | Laterality: Right

## 2019-12-08 MED ORDER — PROPOFOL 10 MG/ML IV BOLUS
INTRAVENOUS | Status: AC
  Filled 2019-12-08: qty 20

## 2019-12-08 MED ORDER — SUGAMMADEX SODIUM 200 MG/2ML IV SOLN
INTRAVENOUS | Status: DC | PRN
  Administered 2019-12-08: 150 mg via INTRAVENOUS

## 2019-12-08 MED ORDER — MORPHINE SULFATE (PF) 2 MG/ML IV SOLN: 0.5000 mg | INTRAVENOUS | Status: DC | PRN

## 2019-12-08 MED ORDER — ACETAMINOPHEN 325 MG PO TABS
ORAL_TABLET | ORAL | Status: AC
  Filled 2019-12-08: qty 1

## 2019-12-08 MED ORDER — PHENYLEPHRINE HCL (PRESSORS) 10 MG/ML IV SOLN
INTRAVENOUS | Status: DC | PRN
  Administered 2019-12-08 (×2): 100 ug via INTRAVENOUS

## 2019-12-08 MED ORDER — CALCIUM CARBONATE-VITAMIN D 500-200 MG-UNIT PO TABS
1.0000 | ORAL_TABLET | Freq: Every day | ORAL | Status: DC
  Administered 2019-12-09 – 2019-12-14 (×6): 1 via ORAL
  Filled 2019-12-08 (×6): qty 1

## 2019-12-08 MED ORDER — METOCLOPRAMIDE HCL 5 MG/ML IJ SOLN: 5.0000 mg | Freq: Three times a day (TID) | INTRAMUSCULAR | Status: DC | PRN

## 2019-12-08 MED ORDER — OCUVITE-LUTEIN PO CAPS
1.0000 | ORAL_CAPSULE | Freq: Every day | ORAL | Status: DC
  Administered 2019-12-09 – 2019-12-14 (×6): 1 via ORAL
  Filled 2019-12-08 (×9): qty 1

## 2019-12-08 MED ORDER — FENTANYL CITRATE (PF) 100 MCG/2ML IJ SOLN
INTRAMUSCULAR | Status: DC | PRN
  Administered 2019-12-08: 50 ug via INTRAVENOUS

## 2019-12-08 MED ORDER — ACETAMINOPHEN 10 MG/ML IV SOLN
INTRAVENOUS | Status: AC
  Filled 2019-12-08: qty 100

## 2019-12-08 MED ORDER — METOCLOPRAMIDE HCL 10 MG PO TABS
5.0000 mg | ORAL_TABLET | Freq: Three times a day (TID) | ORAL | Status: DC | PRN
  Filled 2019-12-08: qty 1

## 2019-12-08 MED ORDER — ROCURONIUM BROMIDE 100 MG/10ML IV SOLN
INTRAVENOUS | Status: DC | PRN
  Administered 2019-12-08: 30 mg via INTRAVENOUS

## 2019-12-08 MED ORDER — BISACODYL 10 MG RE SUPP: 10.0000 mg | Freq: Every day | RECTAL | Status: DC | PRN

## 2019-12-08 MED ORDER — SODIUM CHLORIDE 0.9 % IV SOLN
INTRAVENOUS | Status: DC | PRN
  Administered 2019-12-08: 75 ug/min via INTRAVENOUS

## 2019-12-08 MED ORDER — APIXABAN 5 MG PO TABS
5.0000 mg | ORAL_TABLET | Freq: Two times a day (BID) | ORAL | Status: DC
  Administered 2019-12-10 – 2019-12-14 (×9): 5 mg via ORAL
  Filled 2019-12-08 (×9): qty 1

## 2019-12-08 MED ORDER — LEVETIRACETAM 500 MG PO TABS: 500.0000 mg | ORAL_TABLET | Freq: Two times a day (BID) | ORAL | Status: DC

## 2019-12-08 MED ORDER — ACETAMINOPHEN 10 MG/ML IV SOLN
INTRAVENOUS | Status: DC | PRN
  Administered 2019-12-08: 1000 mg via INTRAVENOUS

## 2019-12-08 MED ORDER — PROPOFOL 10 MG/ML IV BOLUS
INTRAVENOUS | Status: DC | PRN
  Administered 2019-12-08: 70 mg via INTRAVENOUS

## 2019-12-08 MED ORDER — ONDANSETRON HCL 4 MG/2ML IJ SOLN: 4.0000 mg | Freq: Once | INTRAMUSCULAR | Status: DC | PRN

## 2019-12-08 MED ORDER — CLINDAMYCIN PHOSPHATE 600 MG/50ML IV SOLN
INTRAVENOUS | Status: AC
  Filled 2019-12-08: qty 50

## 2019-12-08 MED ORDER — FENTANYL CITRATE (PF) 100 MCG/2ML IJ SOLN
25.0000 ug | INTRAMUSCULAR | Status: DC | PRN
  Administered 2019-12-08: 25 ug via INTRAVENOUS

## 2019-12-08 MED ORDER — NEOMYCIN-POLYMYXIN B GU 40-200000 IR SOLN
Status: DC | PRN
  Administered 2019-12-08: 2 mL

## 2019-12-08 MED ORDER — ACETAMINOPHEN 325 MG PO TABS
325.0000 mg | ORAL_TABLET | Freq: Four times a day (QID) | ORAL | Status: DC | PRN
  Administered 2019-12-08 – 2019-12-14 (×6): 650 mg via ORAL
  Filled 2019-12-08 (×5): qty 2

## 2019-12-08 MED ORDER — HYDROCODONE-ACETAMINOPHEN 5-325 MG PO TABS: 1.0000 | ORAL_TABLET | ORAL | Status: DC | PRN

## 2019-12-08 MED ORDER — FENTANYL CITRATE (PF) 100 MCG/2ML IJ SOLN
INTRAMUSCULAR | Status: AC
  Filled 2019-12-08: qty 2

## 2019-12-08 MED ORDER — EPHEDRINE SULFATE 50 MG/ML IJ SOLN: 5.0000 mg | Freq: Once | INTRAMUSCULAR | Status: DC

## 2019-12-08 MED ORDER — SODIUM CHLORIDE 0.9 % IV SOLN: INTRAVENOUS | Status: DC

## 2019-12-08 MED ORDER — LACTATED RINGERS IV SOLN: INTRAVENOUS | Status: DC | PRN

## 2019-12-08 MED ORDER — EPHEDRINE SULFATE 50 MG/ML IJ SOLN
INTRAMUSCULAR | Status: AC
  Filled 2019-12-08: qty 1

## 2019-12-08 MED ORDER — ACETAMINOPHEN 325 MG PO TABS: 650.0000 mg | ORAL_TABLET | Freq: Four times a day (QID) | ORAL | Status: DC | PRN

## 2019-12-08 MED ORDER — SODIUM CHLORIDE FLUSH 0.9 % IV SOLN
INTRAVENOUS | Status: AC
  Filled 2019-12-08: qty 10

## 2019-12-08 MED ORDER — TRAMADOL HCL 50 MG PO TABS
50.0000 mg | ORAL_TABLET | Freq: Four times a day (QID) | ORAL | Status: DC
  Administered 2019-12-08 – 2019-12-09 (×2): 50 mg via ORAL
  Filled 2019-12-08 (×2): qty 1

## 2019-12-08 MED ORDER — MAGNESIUM CITRATE PO SOLN
1.0000 | Freq: Once | ORAL | Status: DC | PRN
  Filled 2019-12-08: qty 296

## 2019-12-08 MED ORDER — CLINDAMYCIN PHOSPHATE 600 MG/50ML IV SOLN
600.0000 mg | Freq: Four times a day (QID) | INTRAVENOUS | Status: AC
  Administered 2019-12-08 – 2019-12-09 (×3): 600 mg via INTRAVENOUS
  Filled 2019-12-08 (×3): qty 50

## 2019-12-08 MED ORDER — DOCUSATE SODIUM 100 MG PO CAPS
100.0000 mg | ORAL_CAPSULE | Freq: Two times a day (BID) | ORAL | Status: DC
  Administered 2019-12-08 – 2019-12-14 (×12): 100 mg via ORAL
  Filled 2019-12-08 (×12): qty 1

## 2019-12-08 MED ORDER — MAGNESIUM HYDROXIDE 400 MG/5ML PO SUSP
30.0000 mL | Freq: Every day | ORAL | Status: DC | PRN
  Filled 2019-12-08: qty 30

## 2019-12-08 MED ORDER — APIXABAN 2.5 MG PO TABS
2.5000 mg | ORAL_TABLET | Freq: Two times a day (BID) | ORAL | Status: AC
  Administered 2019-12-09 (×2): 2.5 mg via ORAL
  Filled 2019-12-08 (×2): qty 1

## 2019-12-08 MED ORDER — HYDROCODONE-ACETAMINOPHEN 7.5-325 MG PO TABS
1.0000 | ORAL_TABLET | ORAL | Status: DC | PRN
  Administered 2019-12-08: 1 via ORAL
  Filled 2019-12-08: qty 1

## 2019-12-08 SURGICAL SUPPLY — 65 items
BIT DRILL 2.5X2.75 QC CALB (BIT) ×2 IMPLANT
BIT DRILL CALIBRATED 2.7 (BIT) ×1 IMPLANT
BIT DRILL CALIBRATED 2.7MM (BIT) ×1
CANISTER SUCT 1200ML W/VALVE (MISCELLANEOUS) ×3 IMPLANT
CHLORAPREP W/TINT 26 (MISCELLANEOUS) ×3 IMPLANT
COVER WAND RF STERILE (DRAPES) ×3 IMPLANT
CUFF TOURN SGL QUICK 24 (TOURNIQUET CUFF) ×2
CUFF TOURN SGL QUICK 30 (TOURNIQUET CUFF)
CUFF TRNQT CYL 24X4X16.5-23 (TOURNIQUET CUFF) IMPLANT
CUFF TRNQT CYL 30X4X21-28X (TOURNIQUET CUFF) IMPLANT
DRAPE 3/4 80X56 (DRAPES) ×3 IMPLANT
DRAPE C-ARM XRAY 36X54 (DRAPES) ×3 IMPLANT
DRAPE C-ARMOR (DRAPES) ×3 IMPLANT
DRAPE INCISE IOBAN 66X45 STRL (DRAPES) ×3 IMPLANT
DRSG EMULSION OIL 3X8 NADH (GAUZE/BANDAGES/DRESSINGS) ×3 IMPLANT
ELECT CAUTERY BLADE 6.4 (BLADE) ×3 IMPLANT
ELECT REM PT RETURN 9FT ADLT (ELECTROSURGICAL) ×3
ELECTRODE REM PT RTRN 9FT ADLT (ELECTROSURGICAL) ×1 IMPLANT
GAUZE SPONGE 4X4 12PLY STRL (GAUZE/BANDAGES/DRESSINGS) ×3 IMPLANT
GAUZE XEROFORM 1X8 LF (GAUZE/BANDAGES/DRESSINGS) ×3 IMPLANT
GLOVE SURG SYN 9.0  PF PI (GLOVE) ×2
GLOVE SURG SYN 9.0 PF PI (GLOVE) ×1 IMPLANT
GOWN SRG 2XL LVL 4 RGLN SLV (GOWNS) ×1 IMPLANT
GOWN STRL NON-REIN 2XL LVL4 (GOWNS) ×2
GOWN STRL REUS W/ TWL LRG LVL3 (GOWN DISPOSABLE) ×1 IMPLANT
GOWN STRL REUS W/TWL LRG LVL3 (GOWN DISPOSABLE) ×2
HEMOVAC 400CC 10FR (MISCELLANEOUS) ×1 IMPLANT
K-WIRE ACE 1.6X6 (WIRE) ×3
KIT TURNOVER KIT A (KITS) ×3 IMPLANT
KWIRE ACE 1.6X6 (WIRE) IMPLANT
NDL FILTER BLUNT 18X1 1/2 (NEEDLE) ×1 IMPLANT
NEEDLE FILTER BLUNT 18X 1/2SAF (NEEDLE) ×2
NEEDLE FILTER BLUNT 18X1 1/2 (NEEDLE) ×1 IMPLANT
NS IRRIG 1000ML POUR BTL (IV SOLUTION) ×3 IMPLANT
PACK EXTREMITY (MISCELLANEOUS) ×3 IMPLANT
PACK TOTAL KNEE (MISCELLANEOUS) ×3 IMPLANT
PAD ABD DERMACEA PRESS 5X9 (GAUZE/BANDAGES/DRESSINGS) ×6 IMPLANT
PAD PREP 24X41 OB/GYN DISP (PERSONAL CARE ITEMS) ×3 IMPLANT
PLATE 12H 226 RT MED DIST TIB (Plate) ×2 IMPLANT
SCALPEL PROTECTED #10 DISP (BLADE) ×6 IMPLANT
SCALPEL PROTECTED #15 DISP (BLADE) ×6 IMPLANT
SCREW CORT T15 30X3.5XST LCK (Screw) IMPLANT
SCREW CORTICAL 3.5MM  28MM (Screw) ×2 IMPLANT
SCREW CORTICAL 3.5MM 26MM (Screw) ×6 IMPLANT
SCREW CORTICAL 3.5MM 28MM (Screw) IMPLANT
SCREW CORTICAL 3.5X30MM (Screw) ×2 IMPLANT
SCREW LOCK CORT STAR 3.5X20 (Screw) ×6 IMPLANT
SCREW LOCK CORT STAR 3.5X26 (Screw) ×2 IMPLANT
SCREW NL LP 36X3.5 (Screw) ×2 IMPLANT
STAPLER SKIN PROX 35W (STAPLE) ×3 IMPLANT
STRAP SAFETY 5IN WIDE (MISCELLANEOUS) ×3 IMPLANT
SUT ETHIBOND NAB CT1 #1 30IN (SUTURE) ×3 IMPLANT
SUT ETHILON 3-0 FS-10 30 BLK (SUTURE) ×3
SUT VIC AB 0 CT1 36 (SUTURE) ×3 IMPLANT
SUT VIC AB 2-0 CT1 27 (SUTURE) ×2
SUT VIC AB 2-0 CT1 TAPERPNT 27 (SUTURE) ×1 IMPLANT
SUT VIC AB 2-0 SH 27 (SUTURE) ×2
SUT VIC AB 2-0 SH 27XBRD (SUTURE) ×1 IMPLANT
SUT VICRYL 1-0 27IN ABS (SUTURE) ×3
SUTURE EHLN 3-0 FS-10 30 BLK (SUTURE) ×1 IMPLANT
SUTURE VICRYL 1-0 27IN ABS (SUTURE) ×1 IMPLANT
SYR 10ML LL (SYRINGE) ×3 IMPLANT
SYR 5ML LL (SYRINGE) ×3 IMPLANT
TRAY FOLEY MTR SLVR 16FR STAT (SET/KITS/TRAYS/PACK) ×1 IMPLANT
WATER STERILE IRR 1000ML POUR (IV SOLUTION) ×3 IMPLANT

## 2019-12-08 NOTE — Anesthesia Procedure Notes (Signed)
Procedure Name: Intubation Date/Time: 12/08/2019 11:47 AM Performed by: Chanetta Marshall, CRNA Pre-anesthesia Checklist: Patient identified, Emergency Drugs available, Suction available and Patient being monitored Patient Re-evaluated:Patient Re-evaluated prior to induction Oxygen Delivery Method: Circle system utilized Preoxygenation: Pre-oxygenation with 100% oxygen Induction Type: IV induction Ventilation: Mask ventilation without difficulty Laryngoscope Size: McGraph and 3 Grade View: Grade I Tube type: Oral Tube size: 7.0 mm Number of attempts: 1 Airway Equipment and Method: Stylet and Video-laryngoscopy Placement Confirmation: ETT inserted through vocal cords under direct vision,  positive ETCO2,  breath sounds checked- equal and bilateral and CO2 detector Secured at: 21 cm Tube secured with: Tape Dental Injury: Teeth and Oropharynx as per pre-operative assessment

## 2019-12-08 NOTE — Progress Notes (Signed)
[place] [facility] [place] NAME: Amber [name]    MR#:  [id number]  DATE OF BIRTH:  1930-02-24  SUBJECTIVE:   Patient came in after a syncopal episode at home. No clear prodromal symptoms. Feels better. Daughter in the room.   Just got back from surgery. C/o pain. Intermittent bradycardia while sleeping noted per RN  REVIEW OF SYSTEMS:   Review of Systems  Constitutional: Negative for chills, fever and weight loss.  HENT: Negative for ear discharge, ear pain and nosebleeds.   Eyes: Negative for blurred vision, pain and discharge.  Respiratory: Negative for sputum production, shortness of breath, wheezing and stridor.   Cardiovascular: Negative for chest pain, palpitations, orthopnea and PND.  Gastrointestinal: Negative for abdominal pain, diarrhea, nausea and vomiting.  Genitourinary: Negative for frequency and urgency.  Musculoskeletal: Positive for joint pain. Negative for back pain.  Neurological: Positive for weakness. Negative for sensory change, speech change and focal weakness.  Psychiatric/Behavioral: Negative for depression and hallucinations. The patient is not nervous/anxious.    Tolerating Diet:yes Tolerating PT: pending  DRUG ALLERGIES:   Allergies  Allergen Reactions  . Ambien [Zolpidem Tartrate] Other (See Comments)    Reaction:  Keeps pt awake   . Penicillins Itching and Other (See Comments)    Has patient had a PCN reaction causing immediate rash, facial/tongue/throat swelling, SOB or lightheadedness with hypotension: No Has patient had a PCN reaction causing severe rash involving mucus membranes or skin necrosis: No Has patient had a PCN reaction that required hospitalization No Has patient had a PCN reaction occurring within the last 10 years: No If all of the above answers are "NO", then may proceed with Cephalosporin use.  . Iodine Itching  . Succinylcholine Other (See Comments)    Reaction:  Unknown   .  Etodolac Other (See Comments)    GI upset  . Nsaids Other (See Comments)    Reaction: gi upset    VITALS:  Blood pressure (!) 115/48, pulse 61, temperature 98.1 F (36.7 C), resp. rate 16, height 4\' 10"  (1.473 m), weight 77.1 kg, SpO2 98 %.  PHYSICAL EXAMINATION:   Physical Exam  GENERAL:  86 y.o.-year-old patient lying in the bed with no acute distress.  EYES: Pupils equal, round, reactive to light and accommodation. No scleral icterus.   HEENT: Head atraumatic, normocephalic. Oropharynx and nasopharynx clear.  NECK:  Supple, no jugular venous distention. No thyroid enlargement, no tenderness.  LUNGS: Normal breath sounds bilaterally, no wheezing, rales, rhonchi. No use of accessory muscles of respiration.  CARDIOVASCULAR: S1, S2 normal. No murmurs, rubs, or gallops.  ABDOMEN: Soft, nontender, nondistended. Bowel sounds present. No organomegaly or mass.  EXTREMITIES: No cyanosis, clubbing or edema b/l.   Right LE post surgical cast+ NEUROLOGIC: Cranial nerves II through XII are intact. No focal Motor or sensory deficits b/l.   PSYCHIATRIC:  patient is alert and oriented x 3.  SKIN: No obvious rash, lesion, or ulcer.   LABORATORY PANEL:  CBC Recent Labs  Lab 12/08/19 0251  WBC 9.5  HGB 10.7*  HCT 32.4*  PLT 211    Chemistries  Recent Labs  Lab 12/07/19 0437  NA 139  K 3.8  CL 105  CO2 25  GLUCOSE 69*  BUN 29*  CREATININE 0.92  CALCIUM 9.2   Cardiac Enzymes No results for input(s): TROPONINI in the last 168 hours. RADIOLOGY:  DG Tibia/Fibula Right  Result Date: 12/08/2019 CLINICAL DATA:  Right tibia ORIF  EXAM: RIGHT TIBIA AND FIBULA - 2 VIEW; DG C-ARM 1-60 MIN COMPARISON:  12/06/2019 FINDINGS: 8 C-arm fluoroscopic images were obtained intraoperatively and submitted for post operative interpretation. Multiple fluoroscopic images demonstrate placement of medial side plate and screw fixation hardware traversing distal right tibial diaphyseal fracture. Alignment  appears near anatomic. Prior hardware is noted at the distal right fibula, which appears intact. 2.6 minutes of fluoroscopy time was utilized. Please see the performing provider's procedural report for further detail. IMPRESSION: As above. Electronically Signed   By: Davina Poke D.O.   On: 12/08/2019 15:22   DG C-Arm 1-60 Min  Result Date: 12/08/2019 CLINICAL DATA:  Right tibia ORIF EXAM: RIGHT TIBIA AND FIBULA - 2 VIEW; DG C-ARM 1-60 MIN COMPARISON:  12/06/2019 FINDINGS: 8 C-arm fluoroscopic images were obtained intraoperatively and submitted for post operative interpretation. Multiple fluoroscopic images demonstrate placement of medial side plate and screw fixation hardware traversing distal right tibial diaphyseal fracture. Alignment appears near anatomic. Prior hardware is noted at the distal right fibula, which appears intact. 2.6 minutes of fluoroscopy time was utilized. Please see the performing provider's procedural report for further detail. IMPRESSION: As above. Electronically Signed   By: Davina Poke D.O.   On: 12/08/2019 15:22   ECHOCARDIOGRAM COMPLETE  Result Date: 12/07/2019    ECHOCARDIOGRAM REPORT   Patient Name:   Amber [name] Date of Exam: 12/06/2019 Medical Rec #:  [id number]           Height:       58.0 in Accession #:    [id number]          Weight:       170.0 lb Date of Birth:  09/25/1929          BSA:          1.700 m Patient Age:    17 years            BP:           112/94 mmHg Patient Gender: F                   HR:           70 bpm. Exam Location:  [facility] Procedure: 2D Echo, Cardiac Doppler, Color Doppler and Intracardiac            Opacification Agent Indications:     Preoperative evaluation                  R55 Syncope and Collapse  History:         Patient has prior history of Echocardiogram examinations, most                  recent 01/27/2017. Pacemaker; Risk Factors:Hypertension,                  Diabetes, Dyslipidemia and Obesity. Shortness of breath.                   Chronic kidney disease. Coronary artery disease.  Sonographer:     Wilford Sports Rodgers-Jones Referring Phys:  [id number] Harvie Bridge Diagnosing Phys: Isaias Cowman MD  Sonographer Comments: Technically difficult study due to poor echo windows. IMPRESSIONS  1. Left ventricular ejection fraction, by estimation, is 55 to 60%. The left ventricle has normal function. The left ventricle has no regional wall motion abnormalities. There is mild left ventricular hypertrophy. Left ventricular diastolic parameters are indeterminate.  2. Right ventricular systolic function is normal. The  right ventricular size is normal. There is normal pulmonary artery systolic pressure.  3. Left atrial size was moderately dilated.  4. The mitral valve is normal in structure. Mild mitral valve regurgitation. No evidence of mitral stenosis.  5. The aortic valve is normal in structure. Aortic valve regurgitation is not visualized. Mild to moderate aortic valve stenosis.  6. The inferior vena cava is normal in size with greater than 50% respiratory variability, suggesting right atrial pressure of 3 mmHg. FINDINGS  Left Ventricle: Left ventricular ejection fraction, by estimation, is 55 to 60%. The left ventricle has normal function. The left ventricle has no regional wall motion abnormalities. Definity contrast agent was given IV to delineate the left ventricular  endocardial borders. The left ventricular internal cavity size was normal in size. There is mild left ventricular hypertrophy. Left ventricular diastolic parameters are indeterminate. Right Ventricle: The right ventricular size is normal. No increase in right ventricular wall thickness. Right ventricular systolic function is normal. There is normal pulmonary artery systolic pressure. The tricuspid regurgitant velocity is 2.21 m/s, and  with an assumed right atrial pressure of 10 mmHg, the estimated right ventricular systolic pressure is [id number] mmHg. Left Atrium: Left atrial size  was moderately dilated. Right Atrium: Right atrial size was normal in size. Pericardium: There is no evidence of pericardial effusion. Mitral Valve: The mitral valve is normal in structure. Normal mobility of the mitral valve leaflets. Mild mitral valve regurgitation. No evidence of mitral valve stenosis. Tricuspid Valve: The tricuspid valve is normal in structure. Tricuspid valve regurgitation is mild . No evidence of tricuspid stenosis. Aortic Valve: The aortic valve is normal in structure. Aortic valve regurgitation is not visualized. Mild to moderate aortic stenosis is present. Aortic valve mean gradient measures 12.0 mmHg. Aortic valve peak gradient measures 21.9 mmHg. Aortic valve area, by VTI measures 0.80 cm. Pulmonic Valve: The pulmonic valve was normal in structure. Pulmonic valve regurgitation is not visualized. No evidence of pulmonic stenosis. Aorta: The aortic root is normal in size and structure. Venous: The inferior vena cava is normal in size with greater than 50% respiratory variability, suggesting right atrial pressure of 3 mmHg. IAS/Shunts: No atrial level shunt detected by color flow Doppler.  LEFT VENTRICLE PLAX 2D LVIDd:         4.47 cm LVIDs:         3.17 cm LV PW:         1.07 cm LV IVS:        1.28 cm LVOT diam:     1.70 cm LV SV:         35 LV SV Index:   21 LVOT Area:     2.27 cm  RIGHT VENTRICLE RV Basal diam:  3.53 cm LEFT ATRIUM             Index       RIGHT ATRIUM           Index LA diam:        3.90 cm 2.29 cm/m  RA Area:     16.30 cm LA Vol (A2C):   67.1 ml 39.48 ml/m RA Volume:   46.70 ml  27.48 ml/m LA Vol (A4C):   80.4 ml 47.31 ml/m LA Biplane Vol: 80.4 ml 47.31 ml/m  AORTIC VALVE AV Area (Vmax):    0.82 cm AV Area (Vmean):   0.77 cm AV Area (VTI):     0.80 cm AV Vmax:  233.75 cm/s AV Vmean:          164.000 cm/s AV VTI:            0.434 m AV Peak Grad:      21.9 mmHg AV Mean Grad:      12.0 mmHg LVOT Vmax:         84.40 cm/s LVOT Vmean:        55.900 cm/s  LVOT VTI:          0.154 m LVOT/AV VTI ratio: 0.35  AORTA Ao Root diam: 2.90 cm MV E velocity: 88.77 cm/s  TRICUSPID VALVE                            TR Peak grad:   19.5 mmHg                            TR Vmax:        221.00 cm/s                             SHUNTS                            Systemic VTI:  0.15 m                            Systemic Diam: 1.70 cm Isaias Cowman MD Electronically signed by Isaias Cowman MD Signature Date/Time: 12/07/2019/1:33:06 PM    Final    ASSESSMENT AND PLAN:  Mohini Pinion Kirchhoff is a 86 y.o. female with a known history of anemia, anxiety, arthritis, CHF, chronic kidney disease, coronary artery disease, diabetes, hypertension, hyperlipidemia, PPM presents to the emergency department for evaluation of syncope.  Patient was in a usual state of health until this morning when she was getting dressed she believes she lost consciousness, unclear for how long she was down but woke up on the floor.  #. Syncope--unclear etiology -  telemetry monitoring--HR 55-60 /min - received IV fluid hydration -BP dropped with narcotics--recovered with IVF -seen by Dr Josefa Half -family did not witness any seizures  #.  Mildly displaced right distal tibial shaft fracture Management per Ortho dr menz--s/p ORIF right tibial fracture with hardware remoaval  12/08/19 -Pain control -Hold Eliquis for now--likley resume in am if ok with Ortho  #. H/o Diabetes,well controlled - Accuchecks achs with RISS coverage - Heart healthy, carb controlled diet and NPO after midnight -Hold 70/30 for now -cont SSI  #. History of hypertension - on  Norvasc, Coreg, Lasix, Entresto at home -holding it today due to soft BP  #. History of hyperlipidemia - Continue pravastatin  #. History of GERD - Continue Protonix  # Seizure disorder -cont keppra as before  Admission status: Inpatient  Diet/Nutrition: Heart healthy, carb controlled, n.p.o. after midnight Consults called:  cardiology-KC DVT Px:  Early ambulation and resume eliquis when ok with ortho Code Status: Full Code  Disposition Plan: To be determined  Status is: Inpatient  Remains inpatient appropriate because:Inpatient level of care appropriate due to severity of illness   Dispo: The patient is from: Home              Anticipated d/c is to: TBD  Anticipated d/c date is: 3 days              Patient currently is not medically stable to d/c. Planned for ORIF right LE and thereafter PT and d/c planning   TOTAL TIME TAKING CARE OF THIS PATIENT: 35 minutes.  >50% time spent on counselling and coordination of care  Note: This dictation was prepared with Dragon dictation along with smaller phrase technology. Any transcriptional errors that result from this process are unintentional.  Fritzi Mandes M.D    [facility]   CC: Primary care physician; Idelle Crouch, MDPatient ID: Piedad Climes, female   DOB: [birth year], 86 y.o.   MRN: [id number]

## 2019-12-08 NOTE — Progress Notes (Signed)
ANTICOAGULATION CONSULT NOTE - Initial Consult  Pharmacy Consult for Eliquis Indication: atrial fibrillation  Allergies  Allergen Reactions  . Ambien [Zolpidem Tartrate] Other (See Comments)    Reaction:  Keeps pt awake   . Penicillins Itching and Other (See Comments)    Has patient had a PCN reaction causing immediate rash, facial/tongue/throat swelling, SOB or lightheadedness with hypotension: No Has patient had a PCN reaction causing severe rash involving mucus membranes or skin necrosis: No Has patient had a PCN reaction that required hospitalization No Has patient had a PCN reaction occurring within the last 10 years: No If all of the above answers are "NO", then may proceed with Cephalosporin use.  . Iodine Itching  . Succinylcholine Other (See Comments)    Reaction:  Unknown   . Etodolac Other (See Comments)    GI upset  . Nsaids Other (See Comments)    Reaction: gi upset    Patient Measurements: Height: 4\' 10"  (147.3 cm) Weight: 77.1 kg (170 lb) IBW/kg (Calculated) : 40.9 Heparin Dosing Weight:   Vital Signs: Temp: 98.1 F (36.7 C) (06/03 1419) Temp Source: Oral (06/03 1105) BP: 111/74 (06/03 1457) Pulse Rate: 56 (06/03 1457)  Labs: Recent Labs    12/06/19 1321 12/06/19 1321 12/06/19 2059 12/06/19 2110 12/07/19 0437 12/07/19 0734 12/07/19 1830 12/08/19 0251 12/08/19 0950  HGB 12.7   < >  --   --  11.5*  --   --  10.7*  --   HCT 37.3  --   --   --  35.7*  --   --  32.4*  --   PLT 238  --   --   --  222  --   --  211  --   APTT  --   --  36  --   --    < > 116* 100* 84*  LABPROT  --   --  16.2*  --  16.3*  --   --   --   --   INR  --   --  1.4*  --  1.4*  --   --   --   --   HEPARINUNFRC  --   --   --  >3.60*  --   --   --  >3.60*  --   CREATININE 1.07*  --   --   --  0.92  --   --   --   --   TROPONINIHS 8  --  9  --  10  --   --   --   --    < > = values in this interval not displayed.    Estimated Creatinine Clearance: 37.7 mL/min (by C-G formula  based on SCr of 0.92 mg/dL).   Medical History: Past Medical History:  Diagnosis Date  . Anemia   . Anxiety   . Arthritis   . Benign neoplasm of colon   . CHF (congestive heart failure) ([place])   . Chronic kidney disease   . Chronic pain   . Complication of anesthesia 1980's   hard time waking up  . Coronary artery disease with unspecified angina pectoris   . Cough   . Diabetes mellitus without complication ([place])   . Essential hypertension   . High risk medication use   . Hyperlipidemia   . Plantar fascial fibromatosis   . Presence of permanent cardiac pacemaker   . Shortness of breath dyspnea     Medications:  Medications Prior to  Admission  Medication Sig Dispense Refill Last Dose  . acetaminophen (TYLENOL) 500 MG tablet Take 1,000 mg by mouth every 6 (six) hours as needed for mild pain or headache.    prn at prn  . albuterol (PROVENTIL HFA;VENTOLIN HFA) 108 (90 Base) MCG/ACT inhaler Inhale 2 puffs into the lungs every 6 (six) hours as needed for wheezing or shortness of breath. 1 Inhaler 0 prn at prn  . ALPRAZolam (XANAX) 1 MG tablet Take 1 tablet (1 mg total) by mouth at bedtime as needed for anxiety. (Patient taking differently: Take 1 mg by mouth 2 (two) times daily as needed for anxiety or sleep. ) 30 tablet 0 prn at prn  . amLODipine (NORVASC) 10 MG tablet Take 5 mg by mouth daily.    12/06/2019 at Unknown time  . apixaban (ELIQUIS) 5 MG TABS tablet Take 5 mg by mouth 2 (two) times daily.   12/06/2019 at 0800  . calcium-vitamin D (OSCAL WITH D) 500-200 MG-UNIT tablet Take 1 tablet by mouth daily.   12/06/2019 at Unknown time  . carvedilol (COREG) 6.25 MG tablet Take 1 tablet (6.25 mg total) by mouth 2 (two) times daily with a meal. 60 tablet 0 12/06/2019 at 0800  . gabapentin (NEURONTIN) 100 MG capsule Take 200 mg by mouth 3 (three) times daily.    12/06/2019 at 0800  . insulin glargine (LANTUS) 100 UNIT/ML injection Inject 22 Units into the skin daily.   12/06/2019 at Unknown time  .  levETIRAcetam (KEPPRA) 500 MG tablet Take 500 mg by mouth 2 (two) times daily.   12/06/2019 at 0800  . Multiple Vitamins-Minerals (PRESERVISION AREDS 2) CAPS Take 1 capsule by mouth daily.   Past Week at Unknown time  . omeprazole (PRILOSEC) 40 MG capsule Take 40 mg by mouth daily.    12/06/2019 at Unknown time  . pravastatin (PRAVACHOL) 40 MG tablet Take 40 mg by mouth at bedtime.   Past Week at Unknown time  . sacubitril-valsartan (ENTRESTO) 24-26 MG Take 1 tablet by mouth 2 (two) times daily.   12/06/2019 at 0800    Assessment: Pt s/p surgical repair of right tibia, was on Eliquis 5 mg PO BID at home for afib.  Goal of Therapy:  prevention of thromboembolism   Plan:  Will begin low dose Apixaban 2.5 mg PO BID X 2 on 6/4 @ 1000 followed then resume full dose apixaban 5 mg PO BID on 6/5 @ 1000.   Ranetta Armacost D 12/08/2019,3:14 PM

## 2019-12-08 NOTE — Transfer of Care (Signed)
Immediate Anesthesia Transfer of Care Note  Patient: Amber [name]  Procedure(s) Performed: HARDWARE REMOVAL (Right Leg Lower) OPEN REDUCTION INTERNAL FIXATION (ORIF) TIBIA FRACTURE (Right Leg Lower)  Patient Location: PACU  Anesthesia Type:General  Level of Consciousness: awake, alert  and oriented  Airway & Oxygen Therapy: Patient Spontanous Breathing  Post-op Assessment: Report given to RN and Post -op Vital signs reviewed and stable  Post vital signs: Reviewed and stable  Last Vitals:  Vitals Value Taken Time  BP    Temp    Pulse    Resp    SpO2      Last Pain:  Vitals:   12/08/19 1105  TempSrc: Oral  PainSc:          Complications: No apparent anesthesia complications

## 2019-12-08 NOTE — TOC Initial Note (Signed)
Transition of Care [facility]) - Initial/Assessment Note    Patient Details  Name: Amber [name] MRN: [id number] Date of Birth: 1930/03/12  [facility] CM/SW Contact:    Shelbie Hutching, RN Phone Number: 12/08/2019, 10:53 AM  Clinical Narrative:                 Patient admitted with a right tibia fracture after a syncopal episode at home.  Patient is going to the OR today for ORIF of the right tibia.  Patient is from home where she lives with her daughter [name].  Patient is independent in ADL's at baseline and she has a cane.  Patient also reports she has an old wheelchair but she doesn't use it.  Patient is current with her PCP, Dr. Doy Hutching, she gets her routine medications from [facility] and new medications from [facility] in [place].  Patient has had both of her COVID vaccines.  Patient is open to rehab if recommended by PT after surgery.  Patient and family prefer [facility], they have a family member that works there.   Expected Discharge Plan: Skilled Nursing Facility Barriers to Discharge: Continued Medical Work up   Patient Goals and CMS Choice Patient states their goals for this hospitalization and ongoing recovery are:: wants to be able to walk and not hurt CMS Medicare.gov Compare Post Acute Care list provided to:: Patient Choice offered to / list presented to : Patient  Expected Discharge Plan and Services Expected Discharge Plan: [place]   Discharge Planning Services: CM Consult Post Acute Care Choice: [place] Living arrangements for the past 2 months: Single Family Home                                      Prior Living Arrangements/Services Living arrangements for the past 2 months: Single Family Home Lives with:: Adult Children Patient language and need for interpreter reviewed:: Yes Do you feel safe going back to the place where you live?: Yes      Need for Family Participation in Patient Care:  Yes (Comment)(tibia fracture) Care giver support system in place?: Yes (comment)(daughters) Current home services: DME(cane and wheelchair) Criminal Activity/Legal Involvement Pertinent to Current Situation/Hospitalization: No - Comment as needed  Activities of Daily Living Home Assistive Devices/Equipment: Wheelchair, Environmental consultant (specify type) ADL Screening (condition at time of admission) Patient's cognitive ability adequate to safely complete daily activities?: Yes Is the patient deaf or have difficulty hearing?: No Does the patient have difficulty seeing, even when wearing glasses/contacts?: No Does the patient have difficulty concentrating, remembering, or making decisions?: No Patient able to express need for assistance with ADLs?: Yes Does the patient have difficulty dressing or bathing?: No Independently performs ADLs?: Yes (appropriate for developmental age) Does the patient have difficulty walking or climbing stairs?: Yes Weakness of Legs: Both Weakness of Arms/Hands: None  Permission Sought/Granted Permission sought to share information with : Case Manager, Customer service manager, Family Supports Permission granted to share information with : Yes, Verbal Permission Granted  Share Information with NAME: [name] and Vivien Rota  Permission granted to share info w AGENCY: [facility]  Permission granted to share info w Relationship: Daughters     Emotional Assessment Appearance:: Appears stated age Attitude/Demeanor/Rapport: Engaged Affect (typically observed): Accepting Orientation: : Oriented to Self, Oriented to Place, Oriented to  Time, Oriented to Situation Alcohol / Substance Use: Not  Applicable Psych Involvement: No (comment)  Admission diagnosis:  Syncope and collapse [R55] Injury [T14.90XA] Pain [R52] Closed displaced fracture of right tibial tuberosity, initial encounter [S82.151A] Patient Active Problem List   Diagnosis Date Noted  . Closed displaced fracture  of right tibial tuberosity   . Pain   . Syncope and collapse 12/06/2019  . Status post total hip replacement, left 02/09/2018  . Primary osteoarthritis of left hip 01/06/2018  . Chronic pain syndrome 04/09/2017  . Other specified health status 04/08/2017  . Disorder of bone, unspecified 04/08/2017  . Long term current use of opiate analgesic 04/08/2017  . Chronic low back pain (Primary Area of Pain) (Bilateral) (L>R) 04/08/2017  . Chronic pain of both lower extremities  (Tertiary Area of Pain) 04/08/2017  . Chronic groin pain (Secondary Area of Pain) 04/08/2017  . Chronic neck pain (Fourth Area of Pain) (Bilateral) (L>R) 04/08/2017  . Chronic sacroiliac joint pain 04/08/2017  . Other long term (current) drug therapy 04/08/2017  . Syncope 02/09/2017  . Asthmatic bronchitis 01/26/2017  . Fall 10/06/2016  . Anxiety 08/08/2016  . TIA (transient ischemic attack) 07/25/2015  . Sick sinus syndrome ([place]) 03/14/2015  . Chronic heart failure ([place]) 01/05/2015  . Fracture dislocation of ankle 07/27/2014  . IDDM (insulin dependent diabetes mellitus) 07/27/2014  . Essential hypertension 07/27/2014  . Multiple rib fractures 07/27/2014  . 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 Anemia 12/06/2013   PCP:  Idelle Crouch, MD Pharmacy:   [facility] [address] ([address]), [place]) [place] [address] Phone: [phone] Fax: [phone]     Social Determinants of Health (SDOH) Interventions    Readmission Risk Interventions No flowsheet data found.

## 2019-12-08 NOTE — Anesthesia Postprocedure Evaluation (Signed)
Anesthesia Post Note  Patient: Amber [name]  Procedure(s) Performed: HARDWARE REMOVAL (Right Leg Lower) OPEN REDUCTION INTERNAL FIXATION (ORIF) TIBIA FRACTURE (Right Leg Lower)  Patient location during evaluation: PACU Anesthesia Type: General Level of consciousness: awake and alert Pain management: pain level controlled Vital Signs Assessment: post-procedure vital signs reviewed and stable Respiratory status: spontaneous breathing and respiratory function stable Cardiovascular status: stable Anesthetic complications: no     Last Vitals:  Vitals:   12/08/19 1457 12/08/19 1519  BP: 111/74 (!) 115/48  Pulse: (!) 56 61  Resp: 16 16  Temp:    SpO2: 98% 98%    Last Pain:  Vitals:   12/08/19 1419  TempSrc:   PainSc: 0-No pain                 Khylei Wilms K

## 2019-12-08 NOTE — Progress Notes (Signed)
[place] for heparin Indication: atrial fibrillation  Allergies  Allergen Reactions  . Ambien [Zolpidem Tartrate] Other (See Comments)    Reaction:  Keeps pt awake   . Penicillins Itching and Other (See Comments)    Has patient had a PCN reaction causing immediate rash, facial/tongue/throat swelling, SOB or lightheadedness with hypotension: No Has patient had a PCN reaction causing severe rash involving mucus membranes or skin necrosis: No Has patient had a PCN reaction that required hospitalization No Has patient had a PCN reaction occurring within the last 10 years: No If all of the above answers are "NO", then may proceed with Cephalosporin use.  . Iodine Itching  . Succinylcholine Other (See Comments)    Reaction:  Unknown   . Etodolac Other (See Comments)    GI upset  . Nsaids Other (See Comments)    Reaction: gi upset    Patient Measurements: Height: 4\' 10"  (147.3 cm) Weight: 77.1 kg (170 lb) IBW/kg (Calculated) : 40.9 Heparin Dosing Weight: 58.9 kg  Vital Signs: Temp: 98.7 F (37.1 C) (06/02 2302) Temp Source: Oral (06/02 2302) BP: 176/63 (06/02 2302) Pulse Rate: 59 (06/02 2302)  Labs: Recent Labs    12/06/19 1321 12/06/19 1321 12/06/19 2059 12/06/19 2059 12/06/19 2110 12/07/19 0437 12/07/19 0734 12/07/19 1830 12/08/19 0251  HGB 12.7   < >  --   --   --  11.5*  --   --  10.7*  HCT 37.3  --   --   --   --  35.7*  --   --  32.4*  PLT 238  --   --   --   --  222  --   --  211  APTT  --   --  36   < >  --   --  61* 116* 100*  LABPROT  --   --  16.2*  --   --  16.3*  --   --   --   INR  --   --  1.4*  --   --  1.4*  --   --   --   HEPARINUNFRC  --   --   --   --  >3.60*  --   --   --  >3.60*  CREATININE 1.07*  --   --   --   --  0.92  --   --   --   TROPONINIHS 8  --  9  --   --  10  --   --   --    < > = values in this interval not displayed.    Estimated Creatinine Clearance: 37.7 mL/min (by C-G formula based on  SCr of 0.92 mg/dL).   Medical History: Past Medical History:  Diagnosis Date  . Anemia   . Anxiety   . Arthritis   . Benign neoplasm of colon   . CHF (congestive heart failure) ([place])   . Chronic kidney disease   . Chronic pain   . Complication of anesthesia 1980's   hard time waking up  . Coronary artery disease with unspecified angina pectoris   . Cough   . Diabetes mellitus without complication ([place])   . Essential hypertension   . High risk medication use   . Hyperlipidemia   . Plantar fascial fibromatosis   . Presence of permanent cardiac pacemaker   . Shortness of breath dyspnea     Medications:  Scheduled:  . amLODipine  5 mg Oral Daily  . carvedilol  6.25 mg Oral BID WC  . furosemide  20 mg Oral Daily  . gabapentin  200 mg Oral TID  . insulin aspart  0-5 Units Subcutaneous QHS  . insulin aspart  0-9 Units Subcutaneous TID WC  . levETIRAcetam  500 mg Oral BID  . melatonin  5 mg Oral QHS  . pantoprazole  40 mg Oral Daily  . pravastatin  40 mg Oral QHS  . sacubitril-valsartan  1 tablet Oral BID  . sodium chloride flush  3 mL Intravenous Once    Assessment: Patient admitted for LOC w/ R hip dislocation s/p reduction in ED. Patient has a h/o CHF, afib anticoagulated w/ eliquis 5 mg bid PTA, CKD III, is being started on a heparin drip for R hip arthroplasty in OR procedure in the am. Last known eliquis dose per patient was 06/01 @ 0800. Baseline labs WNL, INR slightly elevated at 1.4, baseline anti-Xa pending.  06/01 2059 APTT 36 06/02 0734 APTT 61, SUBtherapeutic 06/02 1830 APTT 116 - level was not ordered - ordered a STAT aPTT.   Goal of Therapy:  Heparin level 0.3-0.7 units/ml  APTT 66 - 102 seconds Monitor platelets by anticoagulation protocol: Yes   Plan:  06/03 @ 0400 aPTT 100 seconds, therapeutic. Will continue current rate and will recheck aPTT at 1000, CBC trended down a little will continue to monitor.  Tobie Lords, PharmD, BCPS Clinical  Pharmacist 12/08/2019,4:58 AM

## 2019-12-08 NOTE — Progress Notes (Signed)
Patient to OR today, heparin stopped for procedure, will resuem Eliquis today

## 2019-12-08 NOTE — Op Note (Signed)
12/08/2019  1:26 PM  PATIENT:  Amber [name]  86 y.o. female  PRE-OPERATIVE DIAGNOSIS:  R tibial shaft fracture & retianed hardware  POST-OPERATIVE DIAGNOSIS:  R tibial shaft fracture & retianed hardware  PROCEDURE:  Procedure(s): HARDWARE REMOVAL (Right) OPEN REDUCTION INTERNAL FIXATION (ORIF) TIBIA FRACTURE (Right)  SURGEON: Laurene Footman, MD  ASSISTANTS: None  ANESTHESIA:   general  EBL:  Total I/O In: 700 [I.V.:700] Out: 25 [Blood:25]  BLOOD ADMINISTERED:none  DRAINS: none   LOCAL MEDICATIONS USED:  NONE  SPECIMEN:  No Specimen  DISPOSITION OF SPECIMEN:  N/A  COUNTS:  YES  TOURNIQUET:   Total Tourniquet Time Documented: Thigh (Right) - 58 minutes Total: Thigh (Right) - 58 minutes   IMPLANTS: [employer] ALPS medial distal tibial locking plate with multiple screws  DICTATION: .Dragon Dictation patient was brought to the operating room and after adequate general anesthesia was obtained the right leg was prepped and draped in the usual sterile fashion with a tourniquet applied the upper thigh.  After patient identification and timeout procedures were completed tourniquet was raised and going through the prior incision over the medial ankle the prior medial plate was exposed for screws and plate removed without difficulty.  Elevator was then used to raise the periosteum off the medial border of the tibia and plate the length determined by C arm views was slid up along this medial side.  With the trauma triangle in slight external rotation of the foot near anatomic alignment obtained.  2 nonlocking screws were placed through the distal portion of the plate to bring the plate down to the bone and also help with the reduction.  Approximately a K wire been placed to maintain appropriate rotation of the plate proximal proximally so the screws to be through the cortex.  After these initial fixation was performed a proximal nonlocking screw was placed drilling measuring and  placing the cortical screw this held the plate proximally against the bone and again near anatomic alignment both AP and lateral projections.  The 3 remaining screw holes of the Alps distal plate were filled with all locking screws.  This appeared to give good fixation distally and there is no motion at the fracture site.  Going proximally 3 additional cortical screws nonlocking replaced.  There is done through stab incisions drilling measuring and placing the cortical screw.  When all screws had been placed C arm view obtained with fluoroscopy up and down the leg and appropriate hardware length was felt to be present as well as adequate alignment and fixation.  The wounds were irrigated and the wound closed with 2-0 Vicryl subcutaneously skin staples Xeroform to cover the wounds followed by 4 x 4's web roll and a posterior splint.  An Ace wrap was applied tourniquet was let down prior to dressing.  PLAN OF CARE: Continue as inpatient  PATIENT DISPOSITION:  PACU - hemodynamically stable.

## 2019-12-08 NOTE — Progress Notes (Signed)
Heparin drip stopped for OR procedure per MD order. Pt and family educated.

## 2019-12-08 NOTE — Progress Notes (Signed)
Per Dr. Rudene Christians heparin drip to be stopped 1hr prior to scheduled surgery. See verbal orders

## 2019-12-08 NOTE — Progress Notes (Signed)
[place] for heparin Indication: atrial fibrillation  Allergies  Allergen Reactions  . Ambien [Zolpidem Tartrate] Other (See Comments)    Reaction:  Keeps pt awake   . Penicillins Itching and Other (See Comments)    Has patient had a PCN reaction causing immediate rash, facial/tongue/throat swelling, SOB or lightheadedness with hypotension: No Has patient had a PCN reaction causing severe rash involving mucus membranes or skin necrosis: No Has patient had a PCN reaction that required hospitalization No Has patient had a PCN reaction occurring within the last 10 years: No If all of the above answers are "NO", then may proceed with Cephalosporin use.  . Iodine Itching  . Succinylcholine Other (See Comments)    Reaction:  Unknown   . Etodolac Other (See Comments)    GI upset  . Nsaids Other (See Comments)    Reaction: gi upset    Patient Measurements: Height: 4\' 10"  (147.3 cm) Weight: 77.1 kg (170 lb) IBW/kg (Calculated) : 40.9 Heparin Dosing Weight: 58.9 kg  Vital Signs: Temp: 98 F (36.7 C) (06/03 0754) Temp Source: Oral (06/02 2302) BP: 108/50 (06/03 0820) Pulse Rate: 52 (06/03 0910)  Labs: Recent Labs    12/06/19 1321 12/06/19 1321 12/06/19 2059 12/06/19 2110 12/07/19 0437 12/07/19 0734 12/07/19 1830 12/08/19 0251 12/08/19 0950  HGB 12.7   < >  --   --  11.5*  --   --  10.7*  --   HCT 37.3  --   --   --  35.7*  --   --  32.4*  --   PLT 238  --   --   --  222  --   --  211  --   APTT  --   --  36  --   --    < > 116* 100* 84*  LABPROT  --   --  16.2*  --  16.3*  --   --   --   --   INR  --   --  1.4*  --  1.4*  --   --   --   --   HEPARINUNFRC  --   --   --  >3.60*  --   --   --  >3.60*  --   CREATININE 1.07*  --   --   --  0.92  --   --   --   --   TROPONINIHS 8  --  9  --  10  --   --   --   --    < > = values in this interval not displayed.    Estimated Creatinine Clearance: 37.7 mL/min (by C-G formula based on SCr  of 0.92 mg/dL).   Medical History: Past Medical History:  Diagnosis Date  . Anemia   . Anxiety   . Arthritis   . Benign neoplasm of colon   . CHF (congestive heart failure) ([place])   . Chronic kidney disease   . Chronic pain   . Complication of anesthesia 1980's   hard time waking up  . Coronary artery disease with unspecified angina pectoris   . Cough   . Diabetes mellitus without complication ([place])   . Essential hypertension   . High risk medication use   . Hyperlipidemia   . Plantar fascial fibromatosis   . Presence of permanent cardiac pacemaker   . Shortness of breath dyspnea     Medications:  Scheduled:  . amLODipine  5 mg Oral Daily  . carvedilol  6.25 mg Oral BID WC  . furosemide  20 mg Oral Daily  . gabapentin  200 mg Oral TID  . insulin aspart  0-5 Units Subcutaneous QHS  . insulin aspart  0-9 Units Subcutaneous TID WC  . levETIRAcetam  500 mg Oral BID  . melatonin  5 mg Oral QHS  . pantoprazole  40 mg Oral Daily  . pravastatin  40 mg Oral QHS  . sacubitril-valsartan  1 tablet Oral BID  . sodium chloride flush  3 mL Intravenous Once    Assessment: Patient admitted for LOC w/ R hip dislocation s/p reduction in ED. Patient has a h/o CHF, afib anticoagulated w/ eliquis 5 mg bid PTA, CKD III, is being started on a heparin drip for R hip arthroplasty in OR procedure in the am. Last known eliquis dose per patient was 06/01 @ 0800. Baseline labs WNL, INR slightly elevated at 1.4, baseline anti-Xa pending.  06/01 2059 APTT 36 06/02 0734 APTT 61, SUBtherapeutic 06/02 1830 APTT 116 - level was not ordered - ordered a STAT aPTT.  06/03 @ 0400 aPTT 100 seconds, therapeutic.  Goal of Therapy:  Heparin level 0.3-0.7 units/ml  APTT 66 - 102 seconds Monitor platelets by anticoagulation protocol: Yes   Plan:  06/03 @ 0950 aPTT 84 seconds, therapeutic x2. Heparin infusion has been DCd  Due to planned procedure today.   F/U on [facility] plan post-op   Pernell Dupre,  PharmD, BCPS Clinical Pharmacist 12/08/2019 10:41 AM

## 2019-12-08 NOTE — Progress Notes (Signed)
Pt taken to OR via bed in stable condition.

## 2019-12-09 DIAGNOSIS — T1490XA Injury, unspecified, initial encounter: Secondary | ICD-10-CM

## 2019-12-09 LAB — GLUCOSE, CAPILLARY
Glucose-Capillary: 133 mg/dL — ABNORMAL HIGH (ref 70–99)
Glucose-Capillary: 160 mg/dL — ABNORMAL HIGH (ref 70–99)
Glucose-Capillary: 168 mg/dL — ABNORMAL HIGH (ref 70–99)
Glucose-Capillary: 159 mg/dL — ABNORMAL HIGH (ref 70–99)

## 2019-12-09 MED ORDER — HYDROCODONE-ACETAMINOPHEN 7.5-325 MG PO TABS: 1.0000 | ORAL_TABLET | Freq: Four times a day (QID) | ORAL | 0 refills | Status: DC | PRN

## 2019-12-09 MED ORDER — HYDROCODONE-ACETAMINOPHEN 7.5-325 MG PO TABS: 1.0000 | ORAL_TABLET | Freq: Four times a day (QID) | ORAL | Status: DC | PRN

## 2019-12-09 MED ORDER — INSULIN GLARGINE 100 UNIT/ML ~~LOC~~ SOLN
10.0000 [IU] | Freq: Every day | SUBCUTANEOUS | Status: DC
  Filled 2019-12-09 (×2): qty 0.1

## 2019-12-09 MED ORDER — MIDODRINE HCL 5 MG PO TABS: 5.0000 mg | ORAL_TABLET | Freq: Three times a day (TID) | ORAL | Status: DC | PRN

## 2019-12-09 MED ORDER — MIDODRINE HCL 5 MG PO TABS: 5.0000 mg | ORAL_TABLET | Freq: Three times a day (TID) | ORAL | Status: DC

## 2019-12-09 MED ORDER — TRAMADOL HCL 50 MG PO TABS
50.0000 mg | ORAL_TABLET | Freq: Four times a day (QID) | ORAL | Status: DC | PRN
  Administered 2019-12-13 – 2019-12-14 (×2): 50 mg via ORAL
  Filled 2019-12-09 (×3): qty 1

## 2019-12-09 MED ORDER — HYDROCODONE-ACETAMINOPHEN 7.5-325 MG PO TABS: 1.0000 | ORAL_TABLET | ORAL | Status: DC | PRN

## 2019-12-09 NOTE — NC FL2 (Signed)
[place] LEVEL OF CARE SCREENING TOOL     IDENTIFICATION  Patient Name: Amber [name] Birthdate: 1929-11-21 Sex: female Admission Date (Current Location): 12/06/2019  [place] and [place] Number:  Engineering geologist and Address:  [facility], [address] [place], [place] [address]      Provider Number: [id number]  Attending Physician Name and Address:  Fritzi Mandes, MD  Relative Name and Phone Number:  Taesha Goodell daughter [phone]    Current Level of Care: Hospital Recommended Level of Care: [place] Prior Approval Number:    Date Approved/Denied:   PASRR Number: [id number]  Discharge Plan: SNF    Current Diagnoses: Patient Active Problem List   Diagnosis Date Noted  . Closed displaced fracture of right tibial tuberosity   . Pain   . Syncope and collapse 12/06/2019  . Status post total hip replacement, left 02/09/2018  . Primary osteoarthritis of left hip 01/06/2018  . Chronic pain syndrome 04/09/2017  . Other specified health status 04/08/2017  . Disorder of bone, unspecified 04/08/2017  . Long term current use of opiate analgesic 04/08/2017  . Chronic low back pain (Primary Area of Pain) (Bilateral) (L>R) 04/08/2017  . Chronic pain of both lower extremities  (Tertiary Area of Pain) 04/08/2017  . Chronic groin pain (Secondary Area of Pain) 04/08/2017  . Chronic neck pain (Fourth Area of Pain) (Bilateral) (L>R) 04/08/2017  . Chronic sacroiliac joint pain 04/08/2017  . Other long term (current) drug therapy 04/08/2017  . Syncope 02/09/2017  . Asthmatic bronchitis 01/26/2017  . Fall 10/06/2016  . Anxiety 08/08/2016  . TIA (transient ischemic attack) 07/25/2015  . Sick sinus syndrome ([place]) 03/14/2015  . Chronic heart failure ([place]) 01/05/2015  . Fracture dislocation of ankle 07/27/2014  . IDDM (insulin dependent diabetes mellitus) 07/27/2014  . Essential hypertension 07/27/2014  . Multiple  rib fractures 07/27/2014  . 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 Anemia 12/06/2013    Orientation RESPIRATION BLADDER Height & Weight     Self, Time, Situation, Place  Normal Continent Weight: 77.1 kg Height:  4\' 10"  (147.3 cm)  BEHAVIORAL SYMPTOMS/MOOD NEUROLOGICAL BOWEL NUTRITION STATUS      Continent Diet(see discharge summary)  AMBULATORY STATUS COMMUNICATION OF NEEDS Skin   Extensive Assist Verbally Surgical wounds                       Personal Care Assistance Level of Assistance  Bathing, Feeding, Dressing Bathing Assistance: Limited assistance Feeding assistance: Limited assistance Dressing Assistance: Limited assistance     Functional Limitations Info             SPECIAL CARE FACTORS FREQUENCY  PT (By licensed PT), OT (By licensed OT)     PT Frequency: 5 times per week OT Frequency: 5 times per week            Contractures Contractures Info: Not present    Additional Factors Info  Code Status, Allergies Code Status Info: Full Allergies Info: Ambien, PCN, iodine, succinylcholine, etodolac, Nsaids           Current Medications (12/09/2019):  This is the current hospital active medication list Current Facility-Administered Medications  Medication Dose Route Frequency Provider Last Rate Last Admin  . 0.9 %  sodium chloride infusion   Intravenous Continuous Hessie Knows, MD 75 mL/hr at 12/09/19 1518 New Bag at 12/09/19 1518  . acetaminophen (TYLENOL) tablet 325-650 mg  325-650 mg Oral Q6H PRN Rudene Christians,  Legrand Como, MD   650 mg at 12/09/19 1520  . albuterol (PROVENTIL) (2.5 MG/3ML) 0.083% nebulizer solution 2.5 mg  2.5 mg Inhalation Q6H PRN Hessie Knows, MD      . apixaban Arne Cleveland) tablet 2.5 mg  2.5 mg Oral BID Hessie Knows, MD   2.5 mg at 12/09/19 0909  . [START ON 12/10/2019] apixaban (ELIQUIS) tablet 5 mg  5 mg Oral BID Hessie Knows, MD      . bisacodyl (DULCOLAX) EC tablet 5 mg  5 mg Oral Daily PRN Hessie Knows, MD      . bisacodyl (DULCOLAX)  suppository 10 mg  10 mg Rectal Daily PRN Hessie Knows, MD      . calcium-vitamin D (OSCAL WITH D) 500-200 MG-UNIT per tablet 1 tablet  1 tablet Oral Daily Hessie Knows, MD   1 tablet at 12/09/19 0908  . docusate sodium (COLACE) capsule 100 mg  100 mg Oral BID Hessie Knows, MD   100 mg at 12/09/19 0908  . gabapentin (NEURONTIN) capsule 200 mg  200 mg Oral TID Hessie Knows, MD   200 mg at 12/09/19 0909  . insulin aspart (novoLOG) injection 0-5 Units  0-5 Units Subcutaneous QHS Hessie Knows, MD      . insulin aspart (novoLOG) injection 0-9 Units  0-9 Units Subcutaneous TID WC Hessie Knows, MD   2 Units at 12/09/19 1233  . insulin glargine (LANTUS) injection 10 Units  10 Units Subcutaneous Daily Fritzi Mandes, MD      . levETIRAcetam (KEPPRA) tablet 500 mg  500 mg Oral BID Hessie Knows, MD   500 mg at 12/09/19 0910  . magnesium citrate solution 1 Bottle  1 Bottle Oral Once PRN Hessie Knows, MD      . magnesium hydroxide (MILK OF MAGNESIA) suspension 30 mL  30 mL Oral Daily PRN Hessie Knows, MD      . melatonin tablet 5 mg  5 mg Oral QHS Hessie Knows, MD   5 mg at 12/08/19 2206  . metoCLOPramide (REGLAN) tablet 5-10 mg  5-10 mg Oral Q8H PRN Hessie Knows, MD       Or  . metoCLOPramide (REGLAN) injection 5-10 mg  5-10 mg Intravenous Q8H PRN Hessie Knows, MD      . midodrine (PROAMATINE) tablet 5 mg  5 mg Oral TID PRN Fritzi Mandes, MD      . multivitamin-lutein [facility]) capsule 1 capsule  1 capsule Oral Daily Hessie Knows, MD   1 capsule at 12/09/19 0914  . ondansetron (ZOFRAN) tablet 4 mg  4 mg Oral Q6H PRN Hessie Knows, MD       Or  . ondansetron [facility]) injection 4 mg  4 mg Intravenous Q6H PRN Hessie Knows, MD      . pantoprazole (PROTONIX) EC tablet 40 mg  40 mg Oral Daily Hessie Knows, MD   40 mg at 12/09/19 0914  . pravastatin (PRAVACHOL) tablet 40 mg  40 mg Oral QHS Hessie Knows, MD   40 mg at 12/08/19 2206  . senna-docusate (Senokot-S) tablet 1 tablet  1 tablet Oral QHS  PRN Hessie Knows, MD      . sodium chloride flush (NS) 0.9 % injection 3 mL  3 mL Intravenous Once Hessie Knows, MD      . traMADol Veatrice Bourbon) tablet 50 mg  50 mg Oral Q6H PRN Fritzi Mandes, MD         Discharge Medications: Please see discharge summary for a list of discharge medications.  Relevant Imaging Results:  Relevant Lab Results:   Additional Information SS# [id number]  Shelbie Hutching, RN

## 2019-12-09 NOTE — Progress Notes (Signed)
   12/09/19 1104  Assess: MEWS Score  Temp 98.2 F (36.8 C)  BP (!) 63/47  Pulse Rate (!) 59  Resp 14  SpO2 96 %  O2 Device Nasal Cannula  O2 Flow Rate (L/min) 3 L/min  Assess: MEWS Score  MEWS Temp 0  MEWS Systolic 3  MEWS Pulse 0  MEWS RR 0  MEWS LOC 0  MEWS Score 3  MEWS Score Color Yellow  Assess: if the MEWS score is Yellow or Red  Were vital signs taken at a resting state? Yes  Focused Assessment Documented focused assessment  Early Detection of Sepsis Score *See Row Information* Low  MEWS guidelines implemented *See Row Information* Yes  Treat  MEWS Interventions Escalated (See documentation below)  Take Vital Signs  Increase Vital Sign Frequency  Yellow: Q 2hr X 2 then Q 4hr X 2, if remains yellow, continue Q 4hrs  Escalate  MEWS: Escalate Yellow: discuss with charge nurse/RN and consider discussing with provider and RRT  Notify: Charge Nurse/RN  Name of Charge Nurse/RN Notified Caryl Pina Rzmirez RN Doloris Hall RN)  Date Charge Nurse/RN Notified 12/09/19  Time Charge Nurse/RN Notified [id number]  Notify: Provider  Provider Name/Title Dr Fritzi Mandes  Date Provider Notified 12/09/19  Time Provider Notified 1123  Notification Type Page  Notification Reason Change in status  Response See new orders  Date of Provider Response 12/09/19  Time of Provider Response 1123  Document  Patient Outcome Other (Comment) (continue to monitor BP)

## 2019-12-09 NOTE — Progress Notes (Addendum)
[facility] NAME: Amber [name]    MR#:  [id number]  DATE OF BIRTH:  June 06, 1930  SUBJECTIVE:   Patient came in after a syncopal episode at home. No clear prodromal symptoms. Feels better. Daughter in the room.   Patient more awake today. Less pain in the ankle. Had issues with low blood pressure this morning however patient able to answer all questions appropriately. Did eat some breakfast.  REVIEW OF SYSTEMS:   Review of Systems  Constitutional: Negative for chills, fever and weight loss.  HENT: Negative for ear discharge, ear pain and nosebleeds.   Eyes: Negative for blurred vision, pain and discharge.  Respiratory: Negative for sputum production, shortness of breath, wheezing and stridor.   Cardiovascular: Negative for chest pain, palpitations, orthopnea and PND.  Gastrointestinal: Negative for abdominal pain, diarrhea, nausea and vomiting.  Genitourinary: Negative for frequency and urgency.  Musculoskeletal: Positive for joint pain. Negative for back pain.  Neurological: Positive for weakness. Negative for sensory change, speech change and focal weakness.  Psychiatric/Behavioral: Negative for depression and hallucinations. The patient is not nervous/anxious.    Tolerating Diet:yes Tolerating PT: SNF  DRUG ALLERGIES:   Allergies  Allergen Reactions  . Ambien [Zolpidem Tartrate] Other (See Comments)    Reaction:  Keeps pt awake   . Penicillins Itching and Other (See Comments)    Has patient had a PCN reaction causing immediate rash, facial/tongue/throat swelling, SOB or lightheadedness with hypotension: No Has patient had a PCN reaction causing severe rash involving mucus membranes or skin necrosis: No Has patient had a PCN reaction that required hospitalization No Has patient had a PCN reaction occurring within the last 10 years: No If all of the above answers are "NO", then may proceed with Cephalosporin use.  .  Iodine Itching  . Succinylcholine Other (See Comments)    Reaction:  Unknown   . Etodolac Other (See Comments)    GI upset  . Nsaids Other (See Comments)    Reaction: gi upset    VITALS:  Blood pressure (!) 116/48, pulse (!) 59, temperature 98.6 F (37 C), temperature source Oral, resp. rate 16, height 4\' 10"  (1.473 m), weight 77.1 kg, SpO2 95 %.  PHYSICAL EXAMINATION:   Physical Exam  GENERAL:  86 y.o.-year-old patient lying in the bed with no acute distress. Obese EYES: Pupils equal, round, reactive to light and accommodation. No scleral icterus.   HEENT: Head atraumatic, normocephalic. Oropharynx and nasopharynx clear.  NECK:  Supple, no jugular venous distention. No thyroid enlargement, no tenderness.  LUNGS: Normal breath sounds bilaterally, no wheezing, rales, rhonchi. No use of accessory muscles of respiration.  CARDIOVASCULAR: S1, S2 normal. No murmurs, rubs, or gallops.  ABDOMEN: Soft, nontender, nondistended. Bowel sounds present. No organomegaly or mass.  EXTREMITIES: No cyanosis, clubbing or edema b/l.   Right LE post surgical cast+ NEUROLOGIC: Cranial nerves II through XII are intact. No focal Motor or sensory deficits b/l.   PSYCHIATRIC:  patient is alert and oriented x 3.  SKIN: No obvious rash, lesion, or ulcer.   LABORATORY PANEL:  CBC Recent Labs  Lab 12/08/19 0251  WBC 9.5  HGB 10.7*  HCT 32.4*  PLT 211    Chemistries  Recent Labs  Lab 12/07/19 0437  NA 139  K 3.8  CL 105  CO2 25  GLUCOSE 69*  BUN 29*  CREATININE 0.92  CALCIUM 9.2   Cardiac Enzymes No results for input(s): TROPONINI in  the last 168 hours. RADIOLOGY:  DG Tibia/Fibula Right  Result Date: 12/08/2019 CLINICAL DATA:  Right tibia ORIF EXAM: RIGHT TIBIA AND FIBULA - 2 VIEW; DG C-ARM 1-60 MIN COMPARISON:  12/06/2019 FINDINGS: 8 C-arm fluoroscopic images were obtained intraoperatively and submitted for post operative interpretation. Multiple fluoroscopic images demonstrate placement  of medial side plate and screw fixation hardware traversing distal right tibial diaphyseal fracture. Alignment appears near anatomic. Prior hardware is noted at the distal right fibula, which appears intact. 2.6 minutes of fluoroscopy time was utilized. Please see the performing provider's procedural report for further detail. IMPRESSION: As above. Electronically Signed   By: Davina Poke D.O.   On: 12/08/2019 15:22   DG Tibia/Fibula Right Port  Result Date: 12/08/2019 CLINICAL DATA:  86 year old female status post ORIF. EXAM: PORTABLE RIGHT TIBIA AND FIBULA - 2 VIEW COMPARISON:  Right lower extremity radiograph dated 12/06/2019. FINDINGS: Status post ORIF of the spiral fracture of the distal tibial diaphysis. The fracture fragments are in near anatomic alignment. Fixation plate and screws of the distal fibula noted. There is a mildly displaced fracture of the proximal fibula. There is a total knee arthroplasty. The bones are osteopenic. Postsurgical changes in the soft tissues with several cutaneous clips. A cast is noted. IMPRESSION: 1. Status post ORIF of the spiral fracture of the distal tibial diaphysis. 2. Mildly displaced fracture of the proximal fibula. Electronically Signed   By: Anner Crete M.D.   On: 12/08/2019 16:33   DG C-Arm 1-60 Min  Result Date: 12/08/2019 CLINICAL DATA:  Right tibia ORIF EXAM: RIGHT TIBIA AND FIBULA - 2 VIEW; DG C-ARM 1-60 MIN COMPARISON:  12/06/2019 FINDINGS: 8 C-arm fluoroscopic images were obtained intraoperatively and submitted for post operative interpretation. Multiple fluoroscopic images demonstrate placement of medial side plate and screw fixation hardware traversing distal right tibial diaphyseal fracture. Alignment appears near anatomic. Prior hardware is noted at the distal right fibula, which appears intact. 2.6 minutes of fluoroscopy time was utilized. Please see the performing provider's procedural report for further detail. IMPRESSION: As above.  Electronically Signed   By: Davina Poke D.O.   On: 12/08/2019 15:22   ASSESSMENT AND PLAN:  Amber [name] is a 86 y.o. female with a known history of anemia, anxiety, arthritis, CHF, chronic kidney disease, coronary artery disease, diabetes, hypertension, hyperlipidemia, PPM presents to the emergency department for evaluation of syncope.  Patient was in a usual state of health until this morning when she was getting dressed she believes she lost consciousness, unclear for how long she was down but woke up on the floor.  #. Syncope--unclear etiology Relative Hypotension -  telemetry monitoring--HR 55-60 /min - received IV fluid hydration -BP dropped with narcotics--recovered with IVF -seen by Dr Josefa Half -family did not witness any seizures -hold bp meds today--dropped BP in the 60's this am -prn midodrine -d/c narcotics  #. Mildly displaced right distal tibial shaft fracture Management per Ortho dr menz--s/p ORIF right tibial fracture with hardware removal  12/08/19 -Pain control -PT recommends rehab  #. H/o Diabetes,well controlled - Accuchecks achs with RISS coverage - Heart healthy, carb controlled diet and NPO after midnight -start Insulin Lantus  10 units daily for now till po intake improves -cont SSI  #. History of hypertension - on  Norvasc, Coreg, Lasix, Entresto at home -holding it today due to soft BP  #. History of hyperlipidemia - Continue pravastatin  #. History of GERD - Continue Protonix  # Seizure disorder -cont keppra as  before  Admission status: Inpatient  Diet/Nutrition: Heart healthy, carb controlled, n.p.o. after midnight Consults called: cardiology-KC DVT Px:  eliquis Code Status: Full Code  Disposition Plan: Rehab  Status is: Inpatient  Remains inpatient appropriate because:Inpatient level of care appropriate due to severity of illness   Dispo: The patient is from: Home              Anticipated d/c is to: TBD               Anticipated d/c date is: 1-2 days              Patient currently is not medically stable to d/c. s/p ORIF right LE and thereafter PT and d/c planning Soft bp   TOTAL TIME TAKING CARE OF THIS PATIENT: 25 minutes.  >50% time spent on counselling and coordination of care  Note: This dictation was prepared with Dragon dictation along with smaller phrase technology. Any transcriptional errors that result from this process are unintentional.  Fritzi Mandes M.D    [facility]   CC: Primary care physician; Idelle Crouch, MDPatient ID: Amber [name], female   DOB: May 25, 1930, 86 y.o.   MRN: [id number]

## 2019-12-09 NOTE — Care Management Important Message (Signed)
Important Message  Patient Details  Name: Amber [name] MRN: [id number] Date of Birth: 05-10-1930   Medicare Important Message Given:  Yes  Initial Medicare IM given by Patient Access Associate on 12/08/2019 at 10:45am.     Dannette Barbara 12/09/2019, 8:16 AM

## 2019-12-09 NOTE — Progress Notes (Signed)
   Subjective: 1 Day Post-Op Procedure(s) (LRB): HARDWARE REMOVAL (Right) OPEN REDUCTION INTERNAL FIXATION (ORIF) TIBIA FRACTURE (Right) Patient reports pain as 0 on 0-10 scale.   Patient is well, and has had no acute complaints or problems Denies any CP, SOB, ABD pain. We will start therapy today.   Objective: Vital signs in last 24 hours: Temp:  [97.9 F (36.6 C)-98.6 F (37 C)] 97.9 F (36.6 C) (06/04 0743) Pulse Rate:  [52-80] 60 (06/04 0743) Resp:  [14-37] 17 (06/04 0743) BP: (96-157)/(40-99) 120/42 (06/04 0743) SpO2:  [80 %-100 %] 92 % (06/04 0743)  Intake/Output from previous day: 06/03 0701 - 06/04 0700 In: 2116.1 [I.V.:2016.1; IV Piggyback:100] Out: 1025 [Urine:1000; Blood:25] Intake/Output this shift: No intake/output data recorded.  Recent Labs    12/06/19 1321 12/07/19 0437 12/08/19 0251  HGB 12.7 11.5* 10.7*   Recent Labs    12/07/19 0437 12/08/19 0251  WBC 8.2 9.5  RBC 3.92 3.63*  HCT 35.7* 32.4*  PLT 222 211   Recent Labs    12/06/19 1321 12/07/19 0437  NA 139 139  K 4.8 3.8  CL 102 105  CO2 28 25  BUN 26* 29*  CREATININE 1.07* 0.92  GLUCOSE 132* 69*  CALCIUM 9.9 9.2   Recent Labs    12/06/19 2059 12/07/19 0437  INR 1.4* 1.4*    EXAM General - Patient is Alert, Appropriate and Oriented Extremity - Neurovascular intact Sensation intact distally No cellulitis present Compartment soft  Splint intact  Dressing - dressing C/D/I and no drainage Motor Function - intact,  toes well on exam.  Past Medical History:  Diagnosis Date  . Anemia   . Anxiety   . Arthritis   . Benign neoplasm of colon   . CHF (congestive heart failure) ([place])   . Chronic kidney disease   . Chronic pain   . Complication of anesthesia 1980's   hard time waking up  . Coronary artery disease with unspecified angina pectoris   . Cough   . Diabetes mellitus without complication ([place])   . Essential hypertension   . High risk medication use   .  Hyperlipidemia   . Plantar fascial fibromatosis   . Presence of permanent cardiac pacemaker   . Shortness of breath dyspnea     Assessment/Plan:   1 Day Post-Op Procedure(s) (LRB): HARDWARE REMOVAL (Right) OPEN REDUCTION INTERNAL FIXATION (ORIF) TIBIA FRACTURE (Right) Active Problems:   Syncope and collapse   Closed displaced fracture of right tibial tuberosity   Pain  Estimated body mass index is 35.53 kg/m as calculated from the following:   Height as of this encounter: 4\' 10"  (1.473 m).   Weight as of this encounter: 77.1 kg. Advance diet Up with therapy, TTWB RLE Pain well controlled Follow up with [facility] orthopedics next week for skin check CM to assist with discharge DVT Prophylaxis - Eliquis    T. Rachelle Hora, PA-C [place] 12/09/2019, 8:51 AM

## 2019-12-09 NOTE — Evaluation (Signed)
Physical Therapy Evaluation Patient Details Name: Amber [name] MRN: [id number] DOB: October 28, 1929 Today's Date: 12/09/2019   History of Present Illness  Pt is an 86 y.o. female with a known history of anemia, anxiety, arthritis, CHF, chronic kidney disease, coronary artery disease, diabetes, hypertension, hyperlipidemia, PPM presents to the emergency department for evaluation of syncope.  Patient was in her usual state of health when she was getting dressed she believes she lost consciousness and woke up on the floor.  Pt diagnosed with R tibial shaft fracture and is s/p ORIF.    Clinical Impression  Pt pleasant and motivated to participate during the session.  Pt put forth good effort during the session but demonstrated functional weakness requiring Mod A for both BLE and trunk control with rolling in the bed left/right.  Attempts to sit at EOB deferred secondary to pt's BP taken at 96/38 with nursing called who checked BP again at 87/38.  Will attempt further mobility assessment when medically appropriate but anticipate that pt will require SNF placement secondary to noted functional weakness combined with TTWB status to the RLE putting pt at a very high risk for falls and/or excess WB through the RW.        Follow Up Recommendations SNF    Equipment Recommendations  Rolling walker with 5" wheels    Recommendations for Other Services       Precautions / Restrictions Precautions Precautions: Fall Restrictions Weight Bearing Restrictions: Yes RLE Weight Bearing: Touchdown weight bearing      Mobility  Bed Mobility Overal bed mobility: Needs Assistance Bed Mobility: Rolling Rolling: Mod assist         General bed mobility comments: Mod A with rolling for both trunk and BLEs  Transfers                 General transfer comment: deferred secondary to low of BP 96/38 with nursing notified who re-checked BP at 87/38.  Ambulation/Gait             General Gait  Details: Unsafe to attempt  Stairs            Wheelchair Mobility    Modified Rankin (Stroke Patients Only)       Balance                                             Pertinent Vitals/Pain Pain Assessment: 0-10 Pain Score: 5  Pain Location: RLE Pain Descriptors / Indicators: Aching;Sore Pain Intervention(s): Premedicated before session;Monitored during session    Martin expects to be discharged to:: Private residence Living Arrangements: Children Available Help at Discharge: Family;Available 24 hours/day Type of Home: Mobile home Home Access: Stairs to enter Entrance Stairs-Rails: Left;Right;Can reach both Entrance Stairs-Number of Steps: 1 Home Layout: One level Home Equipment: Bedside commode;Shower seat;Wheelchair - manual;Cane - single point      Prior Function Level of Independence: Independent with assistive device(s)         Comments: Ind amb in the home without an AD and with a SPC in the community, 2-3 falls in the last 6 months, Ind with ADLs     Hand Dominance        Extremity/Trunk Assessment   Upper Extremity Assessment Upper Extremity Assessment: Generalized weakness    Lower Extremity Assessment Lower Extremity Assessment: Generalized weakness  Communication   Communication: No difficulties  Cognition Arousal/Alertness: Awake/alert Behavior During Therapy: WFL for tasks assessed/performed Overall Cognitive Status: Within Functional Limits for tasks assessed                                        General Comments      Exercises Total Joint Exercises Ankle Circles/Pumps: AROM;Strengthening;Left;5 reps;10 reps Quad Sets: Strengthening;Both;10 reps;5 reps Gluteal Sets: Strengthening;Both;5 reps;10 reps Short Arc Quad: Strengthening;Both;10 reps Heel Slides: AAROM;Both;10 reps;Strengthening Hip ABduction/ADduction: AAROM;Strengthening;Both;10 reps Straight Leg Raises:  AAROM;Strengthening;Both;10 reps Other Exercises Other Exercises: HEP education for LLE APs and BLE QS and GS x 10 each every 1-2 hours daily   Assessment/Plan    PT Assessment Patient needs continued PT services  PT Problem List Decreased strength;Decreased activity tolerance;Decreased balance;Decreased mobility;Decreased knowledge of use of DME;Decreased safety awareness;Pain       PT Treatment Interventions Gait training;Functional mobility training;Therapeutic activities;Therapeutic exercise;DME instruction;Balance training;Patient/family education    PT Goals (Current goals can be found in the Care Plan section)  Acute Rehab PT Goals Patient Stated Goal: To walk again and get back home PT Goal Formulation: With patient Time For Goal Achievement: 12/22/19 Potential to Achieve Goals: Fair    Frequency BID   Barriers to discharge Inaccessible home environment      Co-evaluation               AM-PAC PT "6 Clicks" Mobility  Outcome Measure Help needed turning from your back to your side while in a flat bed without using bedrails?: A Lot Help needed moving from lying on your back to sitting on the side of a flat bed without using bedrails?: A Lot Help needed moving to and from a bed to a chair (including a wheelchair)?: A Lot Help needed standing up from a chair using your arms (e.g., wheelchair or bedside chair)?: A Lot Help needed to walk in hospital room?: Total Help needed climbing 3-5 steps with a railing? : Total 6 Click Score: 10    End of Session Equipment Utilized During Treatment: Gait belt Activity Tolerance: Patient tolerated treatment well Patient left: in bed;with nursing/sitter in room;with call bell/phone within reach;with bed alarm set Nurse Communication: Mobility status;Other (comment)(Low BP) PT Visit Diagnosis: History of falling (Z91.81);Muscle weakness (generalized) (M62.81);Difficulty in walking, not elsewhere classified (R26.2);Pain Pain -  Right/Left: Right Pain - part of body: Leg    Time: 1030-1059 PT Time Calculation (min) (ACUTE ONLY): 29 min   Charges:   PT Evaluation $PT Eval Moderate Complexity: 1 Mod PT Treatments $Therapeutic Exercise: 8-22 mins        D. Scott Acasia Skilton PT, DPT 12/09/19, 11:23 AM

## 2019-12-09 NOTE — Progress Notes (Signed)
OT Cancellation Note  Patient Details Name: Athanasia Stanwood MRN: [id number] DOB: [birth year]   Cancelled Treatment:    Reason Eval/Treat Not Completed: Medical issues which prohibited therapy  OT consult received and chart reviewed. Upon speaking with PT, pt was noted to have soft BP following session. In addition, on chart review, subsequent BP taken by nursing was even lower, 78/54. Will hold on OT Evaluation at this time and f/u at later date/time as pt becomes more appropriate. Thank you.   Gerrianne Scale, [place], OTR/L ascom [phone] 12/09/19, 12:00 PM

## 2019-12-09 NOTE — Progress Notes (Signed)
PT Cancellation Note  Patient Details Name: Mykel Mohl MRN: [id number] DOB: 28-Jul-1929   Cancelled Treatment:     PT attempt PT hold. Pt is lethargic but interactive  with therapist. Daughter in law in room and very supportive. Resting BP 75/48. Rechecked after several minutes with BP elevating to 96/55. PT will return when BP in more stable.    Willette Pa 12/09/2019, 3:11 PM

## 2019-12-09 NOTE — TOC Progression Note (Signed)
Transition of Care [facility]) - Progression Note    Patient Details  Name: Astaria Nanez MRN: [id number] Date of Birth: Jan 26, 1930  [facility]) CM/SW Contact  Shelbie Hutching, RN Phone Number: 12/09/2019, 4:19 PM  Clinical Narrative:    PT is recommending SNF, bed search started.  Patient and family prefer [facility].  Patient has been very hypotensive today, not medically stable for discharge, insurance auth not started.    Expected Discharge Plan: [place] Barriers to Discharge: Continued Medical Work up  Expected Discharge Plan and Services Expected Discharge Plan: [place]   Discharge Planning Services: CM Consult Post Acute Care Choice: [place] Living arrangements for the past 2 months: Single Family Home                                       Social Determinants of Health (SDOH) Interventions    Readmission Risk Interventions No flowsheet data found.

## 2019-12-10 LAB — CBC
HCT: 26.4 % — ABNORMAL LOW (ref 36.0–46.0)
Hemoglobin: 8.3 g/dL — ABNORMAL LOW (ref 12.0–15.0)
MCH: 29.1 pg (ref 26.0–34.0)
MCHC: 31.4 g/dL (ref 30.0–36.0)
MCV: 92.6 fL (ref 80.0–100.0)
Platelets: 165 10*3/uL (ref 150–400)
RBC: 2.85 MIL/uL — ABNORMAL LOW (ref 3.87–5.11)
RDW: 14.9 % (ref 11.5–15.5)
WBC: 10 10*3/uL (ref 4.0–10.5)
nRBC: 0 % (ref 0.0–0.2)

## 2019-12-10 LAB — GLUCOSE, CAPILLARY
Glucose-Capillary: 144 mg/dL — ABNORMAL HIGH (ref 70–99)
Glucose-Capillary: 179 mg/dL — ABNORMAL HIGH (ref 70–99)
Glucose-Capillary: 193 mg/dL — ABNORMAL HIGH (ref 70–99)
Glucose-Capillary: 154 mg/dL — ABNORMAL HIGH (ref 70–99)

## 2019-12-10 MED ORDER — CARVEDILOL 3.125 MG PO TABS
6.2500 mg | ORAL_TABLET | Freq: Two times a day (BID) | ORAL | Status: DC
  Administered 2019-12-10 – 2019-12-14 (×6): 6.25 mg via ORAL
  Filled 2019-12-10 (×8): qty 2

## 2019-12-10 MED ORDER — AMLODIPINE BESYLATE 5 MG PO TABS
5.0000 mg | ORAL_TABLET | Freq: Every day | ORAL | Status: DC
  Administered 2019-12-10: 5 mg via ORAL
  Filled 2019-12-10: qty 1

## 2019-12-10 MED ORDER — INSULIN GLARGINE 100 UNIT/ML ~~LOC~~ SOLN
12.0000 [IU] | Freq: Every day | SUBCUTANEOUS | Status: DC
  Administered 2019-12-11: 12 [IU] via SUBCUTANEOUS
  Filled 2019-12-10: qty 0.12

## 2019-12-10 MED ORDER — ALPRAZOLAM 1 MG PO TABS: 1.0000 mg | ORAL_TABLET | Freq: Two times a day (BID) | ORAL | Status: DC | PRN

## 2019-12-10 MED ORDER — SACUBITRIL-VALSARTAN 24-26 MG PO TABS
1.0000 | ORAL_TABLET | Freq: Two times a day (BID) | ORAL | Status: DC
  Administered 2019-12-10 – 2019-12-14 (×8): 1 via ORAL
  Filled 2019-12-10 (×10): qty 1

## 2019-12-10 NOTE — Progress Notes (Signed)
[facility] [place] NAME: Amber [name]    MR#:  [id number]  DATE OF BIRTH:  03-13-1930  SUBJECTIVE:   Patient came in after a syncopal episode at home. No clear prodromal symptoms. Feels better. Daughter in the room.   Patient more awake out in the chair worked with physical therapy. Blood pressure much improved. Complains of right ankle pain.   REVIEW OF SYSTEMS:   Review of Systems  Constitutional: Negative for chills, fever and weight loss.  HENT: Negative for ear discharge, ear pain and nosebleeds.   Eyes: Negative for blurred vision, pain and discharge.  Respiratory: Negative for sputum production, shortness of breath, wheezing and stridor.   Cardiovascular: Negative for chest pain, palpitations, orthopnea and PND.  Gastrointestinal: Negative for abdominal pain, diarrhea, nausea and vomiting.  Genitourinary: Negative for frequency and urgency.  Musculoskeletal: Positive for joint pain. Negative for back pain.  Neurological: Positive for weakness. Negative for sensory change, speech change and focal weakness.  Psychiatric/Behavioral: Negative for depression and hallucinations. The patient is not nervous/anxious.    Tolerating Diet:yes Tolerating PT: SNF  DRUG ALLERGIES:   Allergies  Allergen Reactions  . Ambien [Zolpidem Tartrate] Other (See Comments)    Reaction:  Keeps pt awake   . Penicillins Itching and Other (See Comments)    Has patient had a PCN reaction causing immediate rash, facial/tongue/throat swelling, SOB or lightheadedness with hypotension: No Has patient had a PCN reaction causing severe rash involving mucus membranes or skin necrosis: No Has patient had a PCN reaction that required hospitalization No Has patient had a PCN reaction occurring within the last 10 years: No If all of the above answers are "NO", then may proceed with Cephalosporin use.  . Iodine Itching  . Succinylcholine Other (See Comments)     Reaction:  Unknown   . Etodolac Other (See Comments)    GI upset  . Nsaids Other (See Comments)    Reaction: gi upset    VITALS:  Blood pressure (!) 128/92, pulse 71, temperature 98.4 F (36.9 C), temperature source Oral, resp. rate 18, height 4\' 10"  (1.473 m), weight 77.1 kg, SpO2 96 %.  PHYSICAL EXAMINATION:   Physical Exam  GENERAL:  86 y.o.-year-old patient lying in the bed with no acute distress. Obese EYES: Pupils equal, round, reactive to light and accommodation. No scleral icterus.   HEENT: Head atraumatic, normocephalic. Oropharynx and nasopharynx clear.  NECK:  Supple, no jugular venous distention. No thyroid enlargement, no tenderness.  LUNGS: Normal breath sounds bilaterally, no wheezing, rales, rhonchi. No use of accessory muscles of respiration.  CARDIOVASCULAR: S1, S2 normal. No murmurs, rubs, or gallops.  ABDOMEN: Soft, nontender, nondistended. Bowel sounds present. No organomegaly or mass.  EXTREMITIES: No cyanosis, clubbing or edema b/l.   Right LE post surgical cast+ NEUROLOGIC: Cranial nerves II through XII are intact. No focal Motor or sensory deficits b/l.   PSYCHIATRIC:  patient is alert and oriented x 3.  SKIN: No obvious rash, lesion, or ulcer.   LABORATORY PANEL:  CBC Recent Labs  Lab 12/10/19 0347  WBC 10.0  HGB 8.3*  HCT 26.4*  PLT 165    Chemistries  Recent Labs  Lab 12/07/19 0437  NA 139  K 3.8  CL 105  CO2 25  GLUCOSE 69*  BUN 29*  CREATININE 0.92  CALCIUM 9.2   Cardiac Enzymes No results for input(s): TROPONINI in the last 168 hours. RADIOLOGY:  DG Tibia/Fibula Right  Result Date: 12/08/2019 CLINICAL DATA:  Right tibia ORIF EXAM: RIGHT TIBIA AND FIBULA - 2 VIEW; DG C-ARM 1-60 MIN COMPARISON:  12/06/2019 FINDINGS: 8 C-arm fluoroscopic images were obtained intraoperatively and submitted for post operative interpretation. Multiple fluoroscopic images demonstrate placement of medial side plate and screw fixation hardware  traversing distal right tibial diaphyseal fracture. Alignment appears near anatomic. Prior hardware is noted at the distal right fibula, which appears intact. 2.6 minutes of fluoroscopy time was utilized. Please see the performing provider's procedural report for further detail. IMPRESSION: As above. Electronically Signed   By: Davina Poke D.O.   On: 12/08/2019 15:22   DG Tibia/Fibula Right Port  Result Date: 12/08/2019 CLINICAL DATA:  86 year old female status post ORIF. EXAM: PORTABLE RIGHT TIBIA AND FIBULA - 2 VIEW COMPARISON:  Right lower extremity radiograph dated 12/06/2019. FINDINGS: Status post ORIF of the spiral fracture of the distal tibial diaphysis. The fracture fragments are in near anatomic alignment. Fixation plate and screws of the distal fibula noted. There is a mildly displaced fracture of the proximal fibula. There is a total knee arthroplasty. The bones are osteopenic. Postsurgical changes in the soft tissues with several cutaneous clips. A cast is noted. IMPRESSION: 1. Status post ORIF of the spiral fracture of the distal tibial diaphysis. 2. Mildly displaced fracture of the proximal fibula. Electronically Signed   By: Anner Crete M.D.   On: 12/08/2019 16:33   DG C-Arm 1-60 Min  Result Date: 12/08/2019 CLINICAL DATA:  Right tibia ORIF EXAM: RIGHT TIBIA AND FIBULA - 2 VIEW; DG C-ARM 1-60 MIN COMPARISON:  12/06/2019 FINDINGS: 8 C-arm fluoroscopic images were obtained intraoperatively and submitted for post operative interpretation. Multiple fluoroscopic images demonstrate placement of medial side plate and screw fixation hardware traversing distal right tibial diaphyseal fracture. Alignment appears near anatomic. Prior hardware is noted at the distal right fibula, which appears intact. 2.6 minutes of fluoroscopy time was utilized. Please see the performing provider's procedural report for further detail. IMPRESSION: As above. Electronically Signed   By: Davina Poke D.O.   On:  12/08/2019 15:22   ASSESSMENT AND PLAN:  [name] is a 86 y.o. female with a known history of anemia, anxiety, arthritis, CHF, chronic kidney disease, coronary artery disease, diabetes, hypertension, hyperlipidemia, PPM presents to the emergency department for evaluation of syncope.  Patient was in a usual state of health until this morning when she was getting dressed she believes she lost consciousness, unclear for how long she was down but woke up on the floor.  #. Syncope--unclear etiology Relative Hypotension -  telemetry monitoring--HR 55-60 /min - received IV fluid hydration--d/c  -BP dropped with narcotics--recovered with IVF -seen by Dr Josefa Half -family did not witness any seizures -resume bp meds today  #. Mildly displaced right distal tibial shaft fracture Management per Ortho dr Rudene Christians --s/p ORIF right tibial fracture with hardware removal  12/08/19 POD#2 -Pain control -PT recommends rehab -TOC to obtained insurance auth--[facility]  #. H/o Diabetes,well controlled - Accuchecks achs with RISS coverage - Heart healthy, carb controlled diet and NPO after midnight -start Insulin Lantus  12 units daily for now till po intake improves -cont SSI  #. History of hypertension - on  Norvasc, Coreg, Lasix, Entresto at home -resume it now since BP better  #. History of hyperlipidemia - Continue pravastatin  #. History of GERD - Continue Protonix  # Seizure disorder -cont keppra as before  Admission status: Inpatient  Diet/Nutrition: Heart healthy, carb controlled  Consults called: cardiology-KC, Sammuel Bailiff DVT Px:  eliquis Code Status: Full Code  Disposition Plan: Rehab  Status is: Inpatient  Remains inpatient appropriate because:Inpatient level of care appropriate due to severity of illness   Dispo: The patient is from: Home              Anticipated d/c is to: TBD              Anticipated d/c date is: 1-2 days              Patient currently is  currently stable for d/c s/p ORIF right LE , awaiting insurance authorization--TOC aware   TOTAL TIME TAKING CARE OF THIS PATIENT: 25 minutes.  >50% time spent on counselling and coordination of care  Note: This dictation was prepared with Dragon dictation along with smaller phrase technology. Any transcriptional errors that result from this process are unintentional.  Fritzi Mandes M.D    [facility]   CC: Primary care physician; Idelle Crouch, MDPatient ID: Piedad Climes, female   DOB: 06-03-1930, 86 y.o.   MRN: [id number]

## 2019-12-10 NOTE — Progress Notes (Signed)
Physical Therapy Treatment Patient Details Name: Amber [name] MRN: [id number] DOB: [birth year] Today's Date: 12/10/2019    History of Present Illness Pt is an 86 y.o. female with a known history of anemia, anxiety, arthritis, CHF, chronic kidney disease, coronary artery disease, diabetes, hypertension, hyperlipidemia, PPM presents to the emergency department for evaluation of syncope.  Patient was in her usual state of health when she was getting dressed she believes she lost consciousness and woke up on the floor.  Pt diagnosed with R tibial shaft fracture and is s/p ORIF.    PT Comments    Pt was in recliner from AM session. She request to return to bed after sitting up several hours. BP in sitting: 139/64. She required max assist to stand to RW and pivot on LLE to EOB. Max vcs throughout for adhering to proper TTWB wt bearing. Pt struggles with maintaining. Once seated EOB required max assist to return and be repositioned to [facility]. Did perform several exercises in bed and pt was able to advance to active SLR. Overall pt tolerated and progressed well with PT. She will need SNF at [place] to address deficits and assist with returning to PLOF. Family member in room with bed alarm in place at conclusion of session.    Follow Up Recommendations  SNF     Equipment Recommendations  Rolling walker with 5" wheels    Recommendations for Other Services       Precautions / Restrictions Precautions Precautions: Fall Restrictions Weight Bearing Restrictions: Yes RLE Weight Bearing: Non weight bearing(TTWB)    Mobility  Bed Mobility Overal bed mobility: Needs Assistance Bed Mobility: Sit to Supine       Sit to supine: Max assist   General bed mobility comments: pt required max assist to return and be repositioned in supine from EOB sitting  Transfers Overall transfer level: Needs assistance Equipment used: Rolling walker (2 wheeled) Transfers: Sit to/from Stand Sit to Stand: Max  assist Stand pivot transfers: Max assist;+2 safety/equipment       General transfer comment: pt performed stand pivot from recliner to EOB. max assist returired with max vcs for maintaining proper wt bearing. pt is more fatigued and was unable to adhere to proper wt beraing restrictions this afternoon versus this morning.   Ambulation/Gait             General Gait Details: unsafe/unable to progress at this time   Stairs             Wheelchair Mobility    Modified Rankin (Stroke Patients Only)       Balance                                            Cognition Arousal/Alertness: Awake/alert Behavior During Therapy: WFL for tasks assessed/performed Overall Cognitive Status: Within Functional Limits for tasks assessed                                 General Comments: Pt is A and O x 4. cooperative and motivated. requesting to get back into bed      Exercises Total Joint Exercises Quad Sets: Strengthening;10 reps;Right Straight Leg Raises: AAROM;AROM;5 reps;Right;Supine    General Comments        Pertinent Vitals/Pain Pain Assessment: No/denies pain Pain Score: 4  Pain  Location: RLE Pain Descriptors / Indicators: Aching;Sore Pain Intervention(s): Limited activity within patient's tolerance;Monitored during session;Premedicated before session;Repositioned    Home Living                      Prior Function            PT Goals (current goals can now be found in the care plan section) Acute Rehab PT Goals Patient Stated Goal: To walk again and get back home Progress towards PT goals: Progressing toward goals    Frequency    BID      PT Plan Current plan remains appropriate    Co-evaluation              AM-PAC PT "6 Clicks" Mobility   Outcome Measure  Help needed turning from your back to your side while in a flat bed without using bedrails?: A Lot Help needed moving from lying on your back to  sitting on the side of a flat bed without using bedrails?: A Lot Help needed moving to and from a bed to a chair (including a wheelchair)?: A Lot Help needed standing up from a chair using your arms (e.g., wheelchair or bedside chair)?: A Lot Help needed to walk in hospital room?: Total Help needed climbing 3-5 steps with a railing? : Total 6 Click Score: 10    End of Session Equipment Utilized During Treatment: Gait belt Activity Tolerance: Patient tolerated treatment well Patient left: in bed;with call bell/phone within reach;with family/visitor present;with bed alarm set Nurse Communication: Mobility status;Precautions;Weight bearing status PT Visit Diagnosis: History of falling (Z91.81);Muscle weakness (generalized) (M62.81);Difficulty in walking, not elsewhere classified (R26.2);Pain Pain - Right/Left: Right Pain - part of body: Leg     Time: 4970-2637 PT Time Calculation (min) (ACUTE ONLY): 23 min  Charges:  $Therapeutic Exercise: 8-22 mins $Therapeutic Activity: 8-22 mins                     Julaine Fusi PTA 12/10/19, 2:01 PM

## 2019-12-10 NOTE — Progress Notes (Signed)
Physical Therapy Treatment Patient Details Name: Amber [name] MRN: [id number] DOB: 1930/06/02 Today's Date: 12/10/2019    History of Present Illness Pt is an 86 y.o. female with a known history of anemia, anxiety, arthritis, CHF, chronic kidney disease, coronary artery disease, diabetes, hypertension, hyperlipidemia, PPM presents to the emergency department for evaluation of syncope.  Patient was in her usual state of health when she was getting dressed she believes she lost consciousness and woke up on the floor.  Pt diagnosed with R tibial shaft fracture and is s/p ORIF.    PT Comments    Pt was long sitting in bed upon arriving. She is much more alert and reports feeling much better today. Does state she has 4/10 in RLE but requesting to get OOB. Resting BP 122/64. Upon sitting up EOB x 2 minutes BP 133/72. She reports not symptoms of dizziness this date. She did require max assist to achieve EOB sitting and max assist to stand 2 x prior to stand pivot to recliner. Pt struggles with maintaining proper wt bearing status but does actively limit wt as able. Therapist cued pt throughout. Overall pt is progressing well. She will need SNF at [place] to address strength, endurance, and safe functional mobility deficits. She was seated in recliner with call bell in reach, BLEs elevated, and RBN aware of pt's abilities. Therapist will return later this date to assist pt with returning to bed.    Follow Up Recommendations  SNF     Equipment Recommendations  Rolling walker with 5" wheels    Recommendations for Other Services       Precautions / Restrictions Precautions Precautions: Fall Restrictions Weight Bearing Restrictions: Yes RLE Weight Bearing: Non weight bearing(TTWB)    Mobility  Bed Mobility Overal bed mobility: Needs Assistance Bed Mobility: Supine to Sit     Supine to sit: Max assist     General bed mobility comments: Max assist to progress to long sitting to short  sitting EOB. Increased time required with constant vcs for technique and safety.   Transfers Overall transfer level: Needs assistance Equipment used: Rolling walker (2 wheeled)(youth) Transfers: Stand Pivot Transfers   Stand pivot transfers: Max assist;+2 safety/equipment;From elevated surface       General transfer comment: Pt was able to stand 2x from elevated bed height with max vcs and max assist. stood ~ 10 sec each trial. pt RLE placed on therapist foot to monitor/limit wt bearing. PT was then abel to stand pivot to recliner via max assist + vcs. gait belt used throughout session for safety.  Ambulation/Gait             General Gait Details: unsafe/unable to progress at this time   Stairs             Wheelchair Mobility    Modified Rankin (Stroke Patients Only)       Balance                                            Cognition Arousal/Alertness: Awake/alert Behavior During Therapy: WFL for tasks assessed/performed Overall Cognitive Status: Within Functional Limits for tasks assessed                                 General Comments: Pt is A and O x 4.  cooperative and motivated. requesting to get OOB      Exercises      General Comments        Pertinent Vitals/Pain Pain Assessment: 0-10 Pain Score: 4  Pain Location: RLE Pain Descriptors / Indicators: Aching;Sore Pain Intervention(s): Limited activity within patient's tolerance;Monitored during session;Premedicated before session;Repositioned    Home Living                      Prior Function            PT Goals (current goals can now be found in the care plan section) Acute Rehab PT Goals Patient Stated Goal: To walk again and get back home Progress towards PT goals: Progressing toward goals    Frequency    BID      PT Plan Current plan remains appropriate    Co-evaluation              AM-PAC PT "6 Clicks" Mobility   Outcome  Measure  Help needed turning from your back to your side while in a flat bed without using bedrails?: A Lot Help needed moving from lying on your back to sitting on the side of a flat bed without using bedrails?: A Lot Help needed moving to and from a bed to a chair (including a wheelchair)?: A Lot Help needed standing up from a chair using your arms (e.g., wheelchair or bedside chair)?: A Lot Help needed to walk in hospital room?: Total Help needed climbing 3-5 steps with a railing? : Total 6 Click Score: 10    End of Session Equipment Utilized During Treatment: Gait belt Activity Tolerance: Patient tolerated treatment well Patient left: in chair;with call bell/phone within reach;with chair alarm set;with nursing/sitter in room Nurse Communication: Mobility status;Precautions;Weight bearing status PT Visit Diagnosis: History of falling (Z91.81);Muscle weakness (generalized) (M62.81);Difficulty in walking, not elsewhere classified (R26.2);Pain Pain - Right/Left: Right Pain - part of body: Leg     Time: 2426-8341 PT Time Calculation (min) (ACUTE ONLY): 27 min  Charges:  $Therapeutic Activity: 23-37 mins                     Julaine Fusi PTA 12/10/19, 9:51 AM

## 2019-12-10 NOTE — Progress Notes (Signed)
  Subjective: 2 Days Post-Op Procedure(s) (LRB): HARDWARE REMOVAL (Right) OPEN REDUCTION INTERNAL FIXATION (ORIF) TIBIA FRACTURE (Right) Patient reports pain as well-controlled.   Patient is well, and has had no acute complaints or problems Plan is to go Skilled nursing facility after hospital stay. Negative for chest pain and shortness of breath Fever: no   Objective: Vital signs in last 24 hours: Temp:  [97.8 F (36.6 C)-98.6 F (37 C)] 98.4 F (36.9 C) (06/04 2352) Pulse Rate:  [57-71] 71 (06/05 0700) Resp:  [13-20] 18 (06/05 0700) BP: (63-128)/(38-92) 128/92 (06/05 0700) SpO2:  [94 %-100 %] 96 % (06/05 0700)  Intake/Output from previous day:  Intake/Output Summary (Last 24 hours) at 12/10/2019 1055 Last data filed at 12/10/2019 0200 Gross per 24 hour  Intake 1728.8 ml  Output --  Net 1728.8 ml    Intake/Output this shift: No intake/output data recorded.  Labs: Recent Labs    12/08/19 0251 12/10/19 0347  HGB 10.7* 8.3*   Recent Labs    12/08/19 0251 12/10/19 0347  WBC 9.5 10.0  RBC 3.63* 2.85*  HCT 32.4* 26.4*  PLT 211 165   No results for input(s): NA, K, CL, CO2, BUN, CREATININE, GLUCOSE, CALCIUM in the last 72 hours. No results for input(s): LABPT, INR in the last 72 hours.   EXAM General - Patient is Alert, Appropriate and Oriented Extremity - posterior splint intact Dressing/Incision -post-op dressing and splint intact; sensation intact over exposed toes, capillary refill <3 seconds  Motor Function - intact, moving foot and toes well on exam.     Assessment/Plan: 2 Days Post-Op Procedure(s) (LRB): HARDWARE REMOVAL (Right) OPEN REDUCTION INTERNAL FIXATION (ORIF) TIBIA FRACTURE (Right) Active Problems:   Syncope and collapse   Closed displaced fracture of right tibial tuberosity   Pain  Estimated body mass index is 35.53 kg/m as calculated from the following:   Height as of this encounter: 4\' 10"  (1.473 m).   Weight as of this encounter:  77.1 kg. Advance diet Up with therapy Discharge to SNF  Awaiting SNF placement.   DVT Prophylaxis - Eliquis Toe touch weight-bearing as tolerated to right leg  Cassell Smiles, PA-C [facility] 12/10/2019, 10:55 AM

## 2019-12-10 NOTE — TOC Progression Note (Signed)
Transition of Care [facility]) - Progression Note    Patient Details  Name: Amber [name] MRN: [id number] Date of Birth: May 26, 1930  [facility] CM/SW Contact  Boris Sharper, LCSW Phone Number: 12/10/2019, 3:27 PM  Clinical Narrative:    CSW notified Tammy with [facility] that [facility] chose [facility] and she stated that she would start the insurance authorization.   Expected Discharge Plan: [place] Barriers to Discharge: Continued Medical Work up  Expected Discharge Plan and Services Expected Discharge Plan: [place]   Discharge Planning Services: CM Consult Post Acute Care Choice: [name] Living arrangements for the past 2 months: Single Family Home                                       Social Determinants of Health (SDOH) Interventions    Readmission Risk Interventions No flowsheet data found.

## 2019-12-10 NOTE — Evaluation (Signed)
Occupational Therapy Evaluation Patient Details Name: Amber [name] MRN: [id number] DOB: 12-14-1929 Today's Date: 12/10/2019    History of Present Illness Pt is an 86 y.o. female with a known history of anemia, anxiety, arthritis, CHF, chronic kidney disease, coronary artery disease, diabetes, hypertension, hyperlipidemia, PPM presents to the emergency department for evaluation of syncope.  Patient was in her usual state of health when she was getting dressed she believes she lost consciousness and woke up on the floor.  Pt diagnosed with R tibial shaft fracture and is s/p ORIF.   Clinical Impression   Pt was seen for OT evaluation this date. Prior to hospital admission, pt was MOD I with self care including using shower chair for bathing and SPC for fxl mobility. Pt lives with DIL and son in mobile home with 1 STE. Currently pt demonstrates impairments as described below (See OT problem list) which functionally limit her ability to perform ADL/self-care tasks. Pt currently requires MIN A for UB bathing, setup for UB grooming, MOD/MAX A for LB ADLs, and MAX A for ADL transfers with RW with tactile/verbal cues to sustain WB restrictions.  Pt would benefit from skilled OT to address noted impairments and functional limitations (see below for any additional details) in order to maximize safety and independence while minimizing falls risk and caregiver burden. Upon hospital discharge, recommend STR to maximize pt safety and return to PLOF.     Follow Up Recommendations  SNF    Equipment Recommendations  Other (comment)(defer to next level of care)    Recommendations for Other Services       Precautions / Restrictions Precautions Precautions: Fall Restrictions Weight Bearing Restrictions: Yes RLE Weight Bearing: Touchdown weight bearing      Mobility Bed Mobility Overal bed mobility: Needs Assistance Bed Mobility: Sit to Supine       Sit to supine: Max assist   General bed  mobility comments: up to chair pre/post session  Transfers Overall transfer level: Needs assistance Equipment used: Rolling walker (2 wheeled) Transfers: Sit to/from Stand Sit to Stand: Max assist       General transfer comment: pt requires increased time and encouragement to participate in sit to stand from chair with OT. OT places foot under pt's R LE to encourage adherance to WB status    Balance Overall balance assessment: Needs assistance Sitting-balance support: Feet supported Sitting balance-Leahy Scale: Good     Standing balance support: Bilateral upper extremity supported Standing balance-Leahy Scale: Poor Standing balance comment: hips flexed t/o static stand with RW for B UE support and MAX A from OT to sustain, but pt makes good effort.                           ADL either performed or assessed with clinical judgement   ADL Overall ADL's : Needs assistance/impaired                                       General ADL Comments: setup to MIN A with UB ADLs d/t limited shld ROM at baseline, MOD/MAX A with LB ADLs. MAX A with ADL transfers with RW with cues for TDWB to RLE.     Vision Patient Visual Report: No change from baseline       Perception     Praxis      Pertinent Vitals/Pain Pain Assessment:  0-10 Pain Score: 4  Pain Location: RLE Pain Descriptors / Indicators: Aching;Sore Pain Intervention(s): Limited activity within patient's tolerance;Monitored during session     Hand Dominance     Extremity/Trunk Assessment Upper Extremity Assessment Upper Extremity Assessment: Generalized weakness(bilateral shoulders with limited ROM (1/4 arc of motion) pt attributes to OA, but also generally weak. Grip 3+/5)   Lower Extremity Assessment Lower Extremity Assessment: Generalized weakness       Communication Communication Communication: No difficulties   Cognition Arousal/Alertness: Awake/alert Behavior During Therapy: WFL for  tasks assessed/performed Overall Cognitive Status: Within Functional Limits for tasks assessed                                 General Comments: Pt is A and O x 4.   General Comments       Exercises Other Exercises: OT facilitates education re: role of OT in acute setting as well as importance of maintaining fxl indep and not relying on family to do all ADLs as that will result in increased weakness. MOderate reception detected. Anticiapte pt will require f/u education.   Shoulder Instructions      Home Living Family/patient expects to be discharged to:: Private residence Living Arrangements: Children Available Help at Discharge: Family;Available 24 hours/day Type of Home: Mobile home Home Access: Stairs to enter Entrance Stairs-Number of Steps: 1 Entrance Stairs-Rails: Left;Right;Can reach both Home Layout: One level     Bathroom Shower/Tub: Occupational psychologist: Handicapped height     Home Equipment: Bedside commode;Shower seat;Wheelchair - manual;Cane - single point          Prior Functioning/Environment Level of Independence: Independent with assistive device(s)        Comments: Ind amb in the home without an AD and with a SPC in the community, 2-3 falls in the last 6 months, Ind with ADLs        OT Problem List: Decreased strength;Decreased range of motion;Decreased activity tolerance;Impaired balance (sitting and/or standing);Decreased knowledge of use of DME or AE;Decreased knowledge of precautions;Impaired UE functional use;Pain      OT Treatment/Interventions: Self-care/ADL training;Therapeutic exercise;Energy conservation;DME and/or AE instruction;Therapeutic activities;Patient/family education;Balance training    OT Goals(Current goals can be found in the care plan section) Acute Rehab OT Goals Patient Stated Goal: To walk again and get back home OT Goal Formulation: With patient Time For Goal Achievement: 12/24/19 Potential to  Achieve Goals: Good  OT Frequency: Min 2X/week   Barriers to D/C:            Co-evaluation              AM-PAC OT "6 Clicks" Daily Activity     Outcome Measure Help from another person eating meals?: None Help from another person taking care of personal grooming?: A Little Help from another person toileting, which includes using toliet, bedpan, or urinal?: A Lot Help from another person bathing (including washing, rinsing, drying)?: A Lot Help from another person to put on and taking off regular upper body clothing?: A Little Help from another person to put on and taking off regular lower body clothing?: A Lot 6 Click Score: 16   End of Session Equipment Utilized During Treatment: Gait belt;Rolling walker Nurse Communication: Mobility status  Activity Tolerance: Patient tolerated treatment well Patient left: in chair;with call bell/phone within reach;with chair alarm set  OT Visit Diagnosis: Unsteadiness on feet (R26.81);Muscle weakness (generalized) (M62.81);History of falling (  Z91.81)                Time: 7209-4709 OT Time Calculation (min): 38 min Charges:  OT General Charges $OT Visit: 1 Visit OT Evaluation $OT Eval Moderate Complexity: 1 Mod OT Treatments $Self Care/Home Management : 8-22 mins $Therapeutic Activity: 8-22 mins  Gerrianne Scale, MS, OTR/L ascom [phone] 12/10/19, 3:30 PM

## 2019-12-11 LAB — GLUCOSE, CAPILLARY
Glucose-Capillary: 227 mg/dL — ABNORMAL HIGH (ref 70–99)
Glucose-Capillary: 129 mg/dL — ABNORMAL HIGH (ref 70–99)
Glucose-Capillary: 159 mg/dL — ABNORMAL HIGH (ref 70–99)

## 2019-12-11 MED ORDER — AMLODIPINE BESYLATE 10 MG PO TABS
10.0000 mg | ORAL_TABLET | Freq: Every day | ORAL | Status: DC
  Administered 2019-12-11 – 2019-12-14 (×4): 10 mg via ORAL
  Filled 2019-12-11 (×4): qty 1

## 2019-12-11 MED ORDER — INSULIN GLARGINE 100 UNIT/ML ~~LOC~~ SOLN
18.0000 [IU] | Freq: Every day | SUBCUTANEOUS | Status: DC
  Administered 2019-12-12 – 2019-12-14 (×3): 18 [IU] via SUBCUTANEOUS
  Filled 2019-12-11 (×3): qty 0.18

## 2019-12-11 NOTE — Progress Notes (Signed)
  Subjective: 3 Days Post-Op Procedure(s) (LRB): HARDWARE REMOVAL (Right) OPEN REDUCTION INTERNAL FIXATION (ORIF) TIBIA FRACTURE (Right) Patient reports pain as well-controlled.   Patient is well, and has had no acute complaints or problems Plan is to go Skilled nursing facility after hospital stay. Negative for chest pain and shortness of breath Gastrointestinal: negative for nausea and vomiting.   Patient has had a bowel movement.  Objective: Vital signs in last 24 hours: Temp:  [98 F (36.7 C)-98.5 F (36.9 C)] 98.3 F (36.8 C) (06/06 0732) Pulse Rate:  [59-68] 61 (06/06 0732) Resp:  [15-18] 17 (06/06 0732) BP: (144-170)/(48-62) 155/52 (06/06 0732) SpO2:  [94 %-96 %] 95 % (06/06 0732)  Intake/Output from previous day:  Intake/Output Summary (Last 24 hours) at 12/11/2019 1058 Last data filed at 12/10/2019 2242 Gross per 24 hour  Intake 633.01 ml  Output 800 ml  Net -166.99 ml    Intake/Output this shift: No intake/output data recorded.  Labs: Recent Labs    12/10/19 0347  HGB 8.3*   Recent Labs    12/10/19 0347  WBC 10.0  RBC 2.85*  HCT 26.4*  PLT 165   No results for input(s): NA, K, CL, CO2, BUN, CREATININE, GLUCOSE, CALCIUM in the last 72 hours. No results for input(s): LABPT, INR in the last 72 hours.   EXAM General - Patient is Alert, Appropriate and Oriented Extremity - Neurovascular intact Dorsiflexion/Plantar flexion intact Compartment soft Dressing/Incision -Postop splint remains in place. Motor Function - intact, moving foot and toes well on exam.    Assessment/Plan: 3 Days Post-Op Procedure(s) (LRB): HARDWARE REMOVAL (Right) OPEN REDUCTION INTERNAL FIXATION (ORIF) TIBIA FRACTURE (Right) Active Problems:   Syncope and collapse   Closed displaced fracture of right tibial tuberosity   Pain  Estimated body mass index is 35.53 kg/m as calculated from the following:   Height as of this encounter: 4\' 10"  (1.473 m).   Weight as of this  encounter: 77.1 kg. Advance diet Up with therapy    DVT Prophylaxis - Eloquis Toe touch weight-bearing as tolerated to right leg  Cassell Smiles, PA-C [facility] 12/11/2019, 10:58 AM

## 2019-12-11 NOTE — Progress Notes (Signed)
Physical Therapy Treatment Patient Details Name: Amber [name] MRN: [id number] DOB: 1930-05-23 Today's Date: 12/11/2019    History of Present Illness Pt is an 86 y.o. female with a known history of anemia, anxiety, arthritis, CHF, chronic kidney disease, coronary artery disease, diabetes, hypertension, hyperlipidemia, PPM presents to the emergency department for evaluation of syncope.  Patient was in her usual state of health when she was getting dressed she believes she lost consciousness and woke up on the floor.  Pt diagnosed with R tibial shaft fracture and is s/p ORIF.    PT Comments    Pt ready for session.  Participated in exercises as described below.  Pt with increased ability to SLR on RLE today and pleased with her improvement.  To EOB with min a x 1 and good effort. Unable to coordinate care for +2 assist.   Assist to bring trunk up off bed.  Once sitting, she is stable for further ex.  Attempts to stand with +1 assist and RW unsuccessful.  She is unable to clear hips from bed.  Cues to lateral scoot up in bed but she quickly returns to supine with min/mod a x 1 to get LE back onto bed.  Overall progressing well.     Follow Up Recommendations  SNF     Equipment Recommendations  Rolling walker with 5" wheels    Recommendations for Other Services       Precautions / Restrictions Precautions Precautions: Fall Restrictions Weight Bearing Restrictions: Yes RLE Weight Bearing: Touchdown weight bearing    Mobility  Bed Mobility Overal bed mobility: Needs Assistance Bed Mobility: Sit to Supine;Supine to Sit     Supine to sit: Min assist;Mod assist Sit to supine: Min assist;Mod assist   General bed mobility comments: Good effort,  Transfers Overall transfer level: Needs assistance Equipment used: Rolling walker (2 wheeled) Transfers: Sit to/from Stand Sit to Stand: Total assist         General transfer comment: unable to stand today +1 assist and RW with  multiple attempts. Unable to coordinate +2 assist  Ambulation/Gait             General Gait Details: unsafe/unable to progress at this time   Stairs             Wheelchair Mobility    Modified Rankin (Stroke Patients Only)       Balance Overall balance assessment: Needs assistance Sitting-balance support: Feet supported Sitting balance-Leahy Scale: Good     Standing balance support: Bilateral upper extremity supported Standing balance-Leahy Scale: Zero Standing balance comment: unable to stand                            Cognition Arousal/Alertness: Awake/alert Behavior During Therapy: WFL for tasks assessed/performed Overall Cognitive Status: Within Functional Limits for tasks assessed                                 General Comments: Pt is A and O x 4.      Exercises Total Joint Exercises Ankle Circles/Pumps: AROM;Strengthening;Left;5 reps;10 reps Quad Sets: Strengthening;Both;10 reps;5 reps Gluteal Sets: Strengthening;Both;5 reps;10 reps Heel Slides: Both;10 reps;Strengthening;AROM Hip ABduction/ADduction: Strengthening;Both;10 reps;AROM Straight Leg Raises: Strengthening;Both;10 reps;AROM Long Arc Quad: AROM;Strengthening;Both;20 reps    General Comments        Pertinent Vitals/Pain Pain Assessment: Faces Faces Pain Scale: Hurts a little bit Pain  Location: RLE Pain Descriptors / Indicators: Sore Pain Intervention(s): Limited activity within patient's tolerance;Monitored during session;Repositioned    Home Living                      Prior Function            PT Goals (current goals can now be found in the care plan section) Progress towards PT goals: Progressing toward goals    Frequency    BID      PT Plan Current plan remains appropriate    Co-evaluation              AM-PAC PT "6 Clicks" Mobility   Outcome Measure  Help needed turning from your back to your side while in a flat bed  without using bedrails?: A Little Help needed moving from lying on your back to sitting on the side of a flat bed without using bedrails?: A Little Help needed moving to and from a bed to a chair (including a wheelchair)?: Total Help needed standing up from a chair using your arms (e.g., wheelchair or bedside chair)?: Total Help needed to walk in hospital room?: Total Help needed climbing 3-5 steps with a railing? : Total 6 Click Score: 10    End of Session Equipment Utilized During Treatment: Gait belt Activity Tolerance: Patient tolerated treatment well Patient left: in bed;with call bell/phone within reach;with family/visitor present;with bed alarm set Nurse Communication: Mobility status;Precautions;Weight bearing status Pain - Right/Left: Right Pain - part of body: Leg     Time: 1036-1100 PT Time Calculation (min) (ACUTE ONLY): 24 min  Charges:  $Therapeutic Exercise: 23-37 mins                    Chesley Noon, PTA 12/11/19, 12:47 PM

## 2019-12-11 NOTE — Progress Notes (Signed)
[facility] [place] NAME: Amber [name]    MR#:  [id number]  DATE OF BIRTH:  [birth year]  SUBJECTIVE:   Patient came in after a syncopal episode at home. No clear prodromal symptoms. Feels better. Daughter in the room.   Patient more awake  Blood pressure much improved. Complains of right ankle pain.   REVIEW OF SYSTEMS:   Review of Systems  Constitutional: Negative for chills, fever and weight loss.  HENT: Negative for ear discharge, ear pain and nosebleeds.   Eyes: Negative for blurred vision, pain and discharge.  Respiratory: Negative for sputum production, shortness of breath, wheezing and stridor.   Cardiovascular: Negative for chest pain, palpitations, orthopnea and PND.  Gastrointestinal: Negative for abdominal pain, diarrhea, nausea and vomiting.  Genitourinary: Negative for frequency and urgency.  Musculoskeletal: Positive for joint pain. Negative for back pain.  Neurological: Positive for weakness. Negative for sensory change, speech change and focal weakness.  Psychiatric/Behavioral: Negative for depression and hallucinations. The patient is not nervous/anxious.    Tolerating Diet:yes Tolerating PT: SNF  DRUG ALLERGIES:   Allergies  Allergen Reactions  . Ambien [Zolpidem Tartrate] Other (See Comments)    Reaction:  Keeps pt awake   . Penicillins Itching and Other (See Comments)    Has patient had a PCN reaction causing immediate rash, facial/tongue/throat swelling, SOB or lightheadedness with hypotension: No Has patient had a PCN reaction causing severe rash involving mucus membranes or skin necrosis: No Has patient had a PCN reaction that required hospitalization No Has patient had a PCN reaction occurring within the last 10 years: No If all of the above answers are "NO", then may proceed with Cephalosporin use.  . Iodine Itching  . Succinylcholine Other (See Comments)    Reaction:  Unknown   . Etodolac Other  (See Comments)    GI upset  . Nsaids Other (See Comments)    Reaction: gi upset    VITALS:  Blood pressure (!) 155/52, pulse 61, temperature 98.3 F (36.8 C), temperature source Oral, resp. rate 17, height 4\' 10"  (1.473 m), weight 77.1 kg, SpO2 95 %.  PHYSICAL EXAMINATION:   Physical Exam  GENERAL:  86 y.o.-year-old patient lying in the bed with no acute distress. Obese EYES: Pupils equal, round, reactive to light and accommodation. No scleral icterus.   HEENT: Head atraumatic, normocephalic. Oropharynx and nasopharynx clear.  NECK:  Supple, no jugular venous distention. No thyroid enlargement, no tenderness.  LUNGS: Normal breath sounds bilaterally, no wheezing, rales, rhonchi. No use of accessory muscles of respiration.  CARDIOVASCULAR: S1, S2 normal. No murmurs, rubs, or gallops.  ABDOMEN: Soft, nontender, nondistended. Bowel sounds present. No organomegaly or mass.  EXTREMITIES: No cyanosis, clubbing or edema b/l.   Right LE post surgical cast+ NEUROLOGIC: Cranial nerves II through XII are intact. No focal Motor or sensory deficits b/l.   PSYCHIATRIC:  patient is alert and oriented x 3.  SKIN: No obvious rash, lesion, or ulcer.   LABORATORY PANEL:  CBC Recent Labs  Lab 12/10/19 0347  WBC 10.0  HGB 8.3*  HCT 26.4*  PLT 165    Chemistries  Recent Labs  Lab 12/07/19 0437  NA 139  K 3.8  CL 105  CO2 25  GLUCOSE 69*  BUN 29*  CREATININE 0.92  CALCIUM 9.2   Cardiac Enzymes No results for input(s): TROPONINI in the last 168 hours. RADIOLOGY:  No results found. ASSESSMENT AND PLAN:  Amber [name]  is a 86 y.o. female with a known history of anemia, anxiety, arthritis, CHF, chronic kidney disease, coronary artery disease, diabetes, hypertension, hyperlipidemia, PPM presents to the emergency department for evaluation of syncope.  Patient was in a usual state of health until this morning when she was getting dressed she believes she lost consciousness, unclear for  how long she was down but woke up on the floor.  #. Syncope--unclear etiology Relative Hypotension -  telemetry monitoring--HR 55-68 /min - received IV fluid hydration--d/c  -BP dropped with narcotics--recovered with IVF -seen by Dr Josefa Half -resume bp meds now  #. Mildly displaced right distal tibial shaft fracture Management per Ortho dr Rudene Christians --s/p ORIF right tibial fracture with hardware removal  12/08/19 POD# 3 -Pain control -PT recommends rehab -TOC to obtained insurance auth--[facility]  #. H/o Diabetes,well controlled - Accuchecks achs with RISS coverage - Heart healthy, carb controlled diet and NPO after midnight - Insulin Lantus  18 units daily for now till po intake improves -cont SSI  #. History of hypertension - on  Norvasc, Coreg, Lasix, Entresto at home -resume it now since BP better  #. History of hyperlipidemia - Continue pravastatin  #. History of GERD - Continue Protonix  # Seizure disorder -cont keppra as before  Admission status: Inpatient  Diet/Nutrition: Heart healthy, carb controlled Consults called: cardiology-[facility], Ortho-[facility] DVT Px:  eliquis Code Status: Full Code  Disposition Plan: Rehab  Status is: Inpatient  Remains inpatient appropriate because:Inpatient level of care appropriate due to severity of illness   Dispo: The patient is from: Home              Anticipated d/c is to: TBD              Anticipated d/c date is: 1-2 days              Patient currently is currently stable for d/c s/p ORIF right LE , awaiting insurance authorization--TOC aware Spoke with dter [name] THIS PATIENT: 25 minutes.  >50% time spent on counselling and coordination of care  Note: This dictation was prepared with Dragon dictation along with smaller phrase technology. Any transcriptional errors that result from this process are unintentional.  Fritzi Mandes M.D    [facility]   CC: Primary care  physician; Idelle Crouch, MDPatient ID: Amber [name], female   DOB: 1929-07-11, 86 y.o.   MRN: [id number]

## 2019-12-11 NOTE — Plan of Care (Signed)

## 2019-12-12 DIAGNOSIS — Z9889 Other specified postprocedural states: Secondary | ICD-10-CM

## 2019-12-12 DIAGNOSIS — I9589 Other hypotension: Secondary | ICD-10-CM

## 2019-12-12 LAB — CBC
HCT: 24.4 % — ABNORMAL LOW (ref 36.0–46.0)
Hemoglobin: 7.9 g/dL — ABNORMAL LOW (ref 12.0–15.0)
MCH: 29.4 pg (ref 26.0–34.0)
MCHC: 32.4 g/dL (ref 30.0–36.0)
MCV: 90.7 fL (ref 80.0–100.0)
Platelets: 210 10*3/uL (ref 150–400)
RBC: 2.69 MIL/uL — ABNORMAL LOW (ref 3.87–5.11)
RDW: 14.8 % (ref 11.5–15.5)
WBC: 8 10*3/uL (ref 4.0–10.5)
nRBC: 0 % (ref 0.0–0.2)

## 2019-12-12 LAB — GLUCOSE, CAPILLARY
Glucose-Capillary: 210 mg/dL — ABNORMAL HIGH (ref 70–99)
Glucose-Capillary: 149 mg/dL — ABNORMAL HIGH (ref 70–99)
Glucose-Capillary: 112 mg/dL — ABNORMAL HIGH (ref 70–99)
Glucose-Capillary: 161 mg/dL — ABNORMAL HIGH (ref 70–99)

## 2019-12-12 LAB — SARS CORONAVIRUS 2 BY RT PCR (HOSPITAL ORDER, PERFORMED IN ~~LOC~~ HOSPITAL LAB): SARS Coronavirus 2: NEGATIVE

## 2019-12-12 MED ORDER — FERROUS SULFATE 325 (65 FE) MG PO TABS
325.0000 mg | ORAL_TABLET | Freq: Two times a day (BID) | ORAL | Status: DC
  Administered 2019-12-12 – 2019-12-14 (×5): 325 mg via ORAL
  Filled 2019-12-12 (×5): qty 1

## 2019-12-12 MED ORDER — ALPRAZOLAM 1 MG PO TABS: 1.0000 mg | ORAL_TABLET | Freq: Two times a day (BID) | ORAL | 0 refills | Status: DC | PRN

## 2019-12-12 MED ORDER — FERROUS SULFATE 325 (65 FE) MG PO TABS: 325.0000 mg | ORAL_TABLET | Freq: Two times a day (BID) | ORAL | 3 refills | Status: DC

## 2019-12-12 NOTE — Progress Notes (Addendum)
Physical Therapy Treatment Patient Details Name: Amber [name] MRN: [id number] DOB: [birth year] Today's Date: 12/12/2019    History of Present Illness Pt is an 86 y.o. female with a known history of anemia, anxiety, arthritis, CHF, chronic kidney disease, coronary artery disease, diabetes, hypertension, hyperlipidemia, PPM presents to the emergency department for evaluation of syncope.  Patient was in her usual state of health when she was getting dressed she believes she lost consciousness and woke up on the floor.  Pt diagnosed with R tibial shaft fracture and is s/p ORIF.    PT Comments    Pt ready for session.  BP taken supine 123/44  Prior to session.  No c/o dizziness this am.  Participated in exercises as described below.  Pt stated MD was in and removed dressings this am.  Honeycomb dressing in place.  To EOB with min/mod a x 1.  BP sitting 142/46. She is able to remain sitting for an extended period of time for continuation on seated ex.  No LOB noted.  She requests to return to supine after fatigue with mod a x 1 reposition in supine. She reports weakness in BUE which make repositioning difficult for her.    SNF remains appropriate for discharge.  Clarified with Geralyn Flash PA.  Continue with TTWB.   Follow Up Recommendations  SNF     Equipment Recommendations  Rolling walker with 5" wheels    Recommendations for Other Services       Precautions / Restrictions Precautions Precautions: Fall Restrictions Weight Bearing Restrictions: Yes RLE Weight Bearing: Touchdown weight bearing    Mobility  Bed Mobility Overal bed mobility: Needs Assistance Bed Mobility: Sit to Supine;Supine to Sit Rolling: Min assist   Supine to sit: Min assist;Mod assist Sit to supine: Mod assist   General bed mobility comments: Good effort,  Transfers                    Ambulation/Gait             General Gait Details: unsafe/unable to progress at this time   Stairs              Wheelchair Mobility    Modified Rankin (Stroke Patients Only)       Balance Overall balance assessment: Needs assistance Sitting-balance support: Feet supported Sitting balance-Leahy Scale: Good     Standing balance support: Bilateral upper extremity supported Standing balance-Leahy Scale: Zero Standing balance comment: unable to stand                            Cognition Arousal/Alertness: Awake/alert Behavior During Therapy: WFL for tasks assessed/performed Overall Cognitive Status: Within Functional Limits for tasks assessed                                 General Comments: Pt is A and O x 4.      Exercises Total Joint Exercises Ankle Circles/Pumps: AROM;Strengthening;Left;5 reps;10 reps Quad Sets: Strengthening;Both;10 reps;5 reps Gluteal Sets: Strengthening;Both;5 reps;10 reps Heel Slides: Both;10 reps;Strengthening;AROM Hip ABduction/ADduction: Strengthening;Both;10 reps;AROM Straight Leg Raises: Strengthening;Both;10 reps;AROM Long Arc Quad: AROM;Strengthening;Both;20 reps    General Comments        Pertinent Vitals/Pain Pain Assessment: Faces Faces Pain Scale: Hurts a little bit Pain Location: RLE Pain Descriptors / Indicators: Sore Pain Intervention(s): Limited activity within patient's tolerance;Monitored during session;Repositioned    Home  Living                      Prior Function            PT Goals (current goals can now be found in the care plan section) Progress towards PT goals: Progressing toward goals    Frequency    BID      PT Plan Current plan remains appropriate    Co-evaluation              AM-PAC PT "6 Clicks" Mobility   Outcome Measure  Help needed turning from your back to your side while in a flat bed without using bedrails?: A Little Help needed moving from lying on your back to sitting on the side of a flat bed without using bedrails?: A Little Help needed  moving to and from a bed to a chair (including a wheelchair)?: Total Help needed standing up from a chair using your arms (e.g., wheelchair or bedside chair)?: Total Help needed to walk in hospital room?: Total Help needed climbing 3-5 steps with a railing? : Total 6 Click Score: 10    End of Session Equipment Utilized During Treatment: Gait belt Activity Tolerance: Patient tolerated treatment well Patient left: in bed;with call bell/phone within reach;with family/visitor present;with bed alarm set Nurse Communication: Mobility status;Precautions;Weight bearing status Pain - Right/Left: Right Pain - part of body: Leg     Time: 0825-0904 PT Time Calculation (min) (ACUTE ONLY): 39 min  Charges:  $Therapeutic Exercise: 8-22 mins $Therapeutic Activity: 23-37 mins                    Chesley Noon, PTA 12/12/19, 10:12 AM

## 2019-12-12 NOTE — Discharge Instructions (Signed)
Diet: As you were doing prior to hospitalization   Keep dressing clean and dry at all times.  Activity:  Increase activity slowly as tolerated, but follow the weight bearing instructions below.    Weight Bearing:   Toe touch weight bearing right lower extremity  To prevent constipation: you may use a stool softener such as -  Colace (over the counter) 100 mg by mouth twice a day  Drink plenty of fluids (prune juice may be helpful) and high fiber foods Miralax (over the counter) for constipation as needed.    Itching:  If you experience itching with your medications, try taking only a single pain pill, or even half a pain pill at a time.  You may take up to 10 pain pills per day, and you can also use benadryl over the counter for itching or also to help with sleep.   Precautions:  If you experience chest pain or shortness of breath - call 911 immediately for transfer to the hospital emergency department!!  If you develop a fever greater that 101 F, purulent drainage from wound, increased redness or drainage from wound, or calf pain-Call [place]                                               Follow- Up Appointment:  Please call for an appointment to be seen in 3-5 days at [facility]

## 2019-12-12 NOTE — TOC Progression Note (Signed)
Transition of Care [facility]) - Progression Note    Patient Details  Name: Amber [name] MRN: [id number] Date of Birth: [birth year]  Transition of Care [facility]) CM/SW Contact  Shelbie Hutching, RN Phone Number: 12/12/2019, 10:27 AM  Clinical Narrative:    [facility] reports that [name] is still pending.    Expected Discharge Plan: [place] Barriers to Discharge: Continued Medical Work up  Expected Discharge Plan and Services Expected Discharge Plan: [place]   Discharge Planning Services: CM Consult Post Acute Care Choice: [place] Living arrangements for the past 2 months: Single Family Home Expected Discharge Date: 12/12/19                                     Social Determinants of Health (SDOH) Interventions    Readmission Risk Interventions No flowsheet data found.

## 2019-12-12 NOTE — Care Management Important Message (Signed)
Important Message  Patient Details  Name: Amber [name] MRN: [id number] Date of Birth: [birth year]   Medicare Important Message Given:  Yes     [name] 12/12/2019, 10:20 AM

## 2019-12-12 NOTE — Progress Notes (Signed)
°  Subjective: 4 Days Post-Op Procedure(s) (LRB): HARDWARE REMOVAL (Right) OPEN REDUCTION INTERNAL FIXATION (ORIF) TIBIA FRACTURE (Right) Patient reports pain as well-controlled.   Patient is well, and has had no acute complaints or problems Plan is to go Skilled nursing facility after hospital stay. Negative for chest pain and shortness of breath Gastrointestinal: negative for nausea and vomiting.   Patient has had a bowel movement.  Objective: Vital signs in last 24 hours: Temp:  [98.4 F (36.9 C)-98.7 F (37.1 C)] 98.7 F (37.1 C) (06/07 0050) Pulse Rate:  [60-67] 60 (06/07 0050) Resp:  [16-19] 16 (06/07 0050) BP: (127-166)/(39-48) 127/39 (06/07 0050) SpO2:  [93 %-94 %] 94 % (06/07 0050)  Intake/Output from previous day:  Intake/Output Summary (Last 24 hours) at 12/12/2019 1001 Last data filed at 12/12/2019 0526 Gross per 24 hour  Intake --  Output 650 ml  Net -650 ml    Intake/Output this shift: No intake/output data recorded.  Labs: Recent Labs    12/10/19 0347 12/12/19 0619  HGB 8.3* 7.9*   Recent Labs    12/10/19 0347 12/12/19 0619  WBC 10.0 8.0  RBC 2.85* 2.69*  HCT 26.4* 24.4*  PLT 165 210   No results for input(s): NA, K, CL, CO2, BUN, CREATININE, GLUCOSE, CALCIUM in the last 72 hours. No results for input(s): LABPT, INR in the last 72 hours.   EXAM General - Patient is Alert, Appropriate and Oriented Extremity - Neurovascular intact Dorsiflexion/Plantar flexion intact Compartment soft Dressing/Incision -Postop splint remains in place. Motor Function - intact, moving foot and toes well on exam.    Assessment/Plan: 4 Days Post-Op Procedure(s) (LRB): HARDWARE REMOVAL (Right) OPEN REDUCTION INTERNAL FIXATION (ORIF) TIBIA FRACTURE (Right) Active Problems:   Syncope and collapse   Closed displaced fracture of right tibial tuberosity   Pain   S/P ORIF (open reduction internal fixation) fracture   Hypotension due to hypovolemia  Estimated body  mass index is 35.53 kg/m as calculated from the following:   Height as of this encounter: 4\' 10"  (1.473 m).   Weight as of this encounter: 77.1 kg. Advance diet Up with therapy  VSS Labs stable. Hgb 7.9. patient will start oral iron supplement Follow up with [facility] 2 weeks post op Keep incision and dressing sites clean and dry  DVT Prophylaxis - Eliquis Toe touch weight-bearing as tolerated to right leg  T. Billey Co, PA-C [facility] 12/12/2019, 10:01 AM

## 2019-12-12 NOTE — Plan of Care (Signed)

## 2019-12-12 NOTE — Discharge Summary (Addendum)
[facility] [place] NAME: Amber [name]    MR#:  [id number]  DATE OF BIRTH:  [birth year]  DATE OF ADMISSION:  12/06/2019 ADMITTING PHYSICIAN: Harvie Bridge, DO  DATE OF DISCHARGE: 12/12/2019  PRIMARY CARE PHYSICIAN: Idelle Crouch, MD    ADMISSION DIAGNOSIS:  Syncope and collapse [R55] Injury [T14.90XA] Pain [R52] Closed displaced fracture of right tibial tuberosity, initial encounter [S82.151A]  DISCHARGE DIAGNOSIS:  Right tibial shaft fracture  S/p ORIF and hardware removal Syncope suspected due to Relative hypotenison--resolved SECONDARY DIAGNOSIS:   Past Medical History:  Diagnosis Date   Anemia    Anxiety    Arthritis    Benign neoplasm of colon    CHF (congestive heart failure) (HCC)    Chronic kidney disease    Chronic pain    Complication of anesthesia 1980's   hard time waking up   Coronary artery disease with unspecified angina pectoris    Cough    Diabetes mellitus without complication ([place])    Essential hypertension    High risk medication use    Hyperlipidemia    Plantar fascial fibromatosis    Presence of permanent cardiac pacemaker    Shortness of breath dyspnea     HOSPITAL COURSE:   Amber [name] a 86 y.o.femalewith a known history of anemia, anxiety, arthritis, CHF, chronic kidney disease, coronary artery disease, diabetes, hypertension, hyperlipidemia, PPMpresents to the emergency department for evaluation of syncope. Patient was in a usual state of health until this morning when she was getting dressed she believes she lost consciousness, unclear for how long she was down but woke up on the floor.  #.Syncope--unclear etiology Relative Hypotension - telemetry monitoring--HR 55-68 /min - received IV fluid hydration--d/c  -BP dropped with narcotics--recovered with IVF -seen by Dr Josefa Half -resume bp meds now  #.Mildly displaced right distal tibial shaft  fracture Management per Ortho dr Rudene Christians --s/p ORIF right tibial fracture with hardware removal  12/08/19 POD# 4 -Pain control -PT recommends rehab -TOC to obtained insurance auth--[facility] -Hgb 11--7.9. no active bleeding noted. Pt on eliquis. Appears post -op anemia and pt got some IV fluid bolus also for hypotension als and could be some dilutional -start po iron pills  #.H/o Diabetes,well controlled - Accuchecks achs with RISS coverage - Heart healthy, carb controlled dietand NPO after midnight - Insulin Lantus  22 units daily   #. History ofhypertension - on  Norvasc, Coreg, Lasix, Entresto at home -resumed it now since BP better  #. History ofhyperlipidemia - Continuepravastatin  #. History ofGERD - ContinueProtonix  # Seizure disorder -cont keppra as before  Admission status:Inpatient  Diet/Nutrition:Heart healthy, carb controlled Consults called: cardiology-[facility], Ortho-[facility] DVT WU:[id number] Code Status:Full Code Disposition Plan: Rehab  Status is: Inpatient  Remains inpatient appropriate because:Inpatient level of care appropriate due to severity of illness   Dispo: The patient is from: Home  Anticipated d/c is to: TBD  Anticipated d/c date is: 1-2 days  Patient currently is currently stable for d/c s/p ORIF right LE ,d/c once insurance authorization obtained -TOC aware Spoke with dter [name] on Sunday  CONSULTS OBTAINED:  Treatment Team:  Hessie Knows, MD  DRUG ALLERGIES:   Allergies  Allergen Reactions   Ambien [Zolpidem Tartrate] Other (See Comments)    Reaction:  Keeps pt awake    Penicillins Itching and Other (See Comments)    Has patient had a PCN reaction causing immediate rash, facial/tongue/throat swelling, SOB or lightheadedness with  hypotension: No Has patient had a PCN reaction causing severe rash involving mucus membranes or skin necrosis: No Has patient had a PCN  reaction that required hospitalization No Has patient had a PCN reaction occurring within the last 10 years: No If all of the above answers are "NO", then may proceed with Cephalosporin use.   Iodine Itching   Succinylcholine Other (See Comments)    Reaction:  Unknown    Etodolac Other (See Comments)    GI upset   Nsaids Other (See Comments)    Reaction: gi upset    DISCHARGE MEDICATIONS:   Allergies as of 12/12/2019      Reactions   Ambien [zolpidem Tartrate] Other (See Comments)   Reaction:  Keeps pt awake    Penicillins Itching, Other (See Comments)   Has patient had a PCN reaction causing immediate rash, facial/tongue/throat swelling, SOB or lightheadedness with hypotension: No Has patient had a PCN reaction causing severe rash involving mucus membranes or skin necrosis: No Has patient had a PCN reaction that required hospitalization No Has patient had a PCN reaction occurring within the last 10 years: No If all of the above answers are "NO", then may proceed with Cephalosporin use.   Iodine Itching   Succinylcholine Other (See Comments)   Reaction:  Unknown    Etodolac Other (See Comments)   GI upset   Nsaids Other (See Comments)   Reaction: gi upset      Medication List    TAKE these medications   acetaminophen 500 MG tablet Commonly known as: TYLENOL Take 1,000 mg by mouth every 6 (six) hours as needed for mild pain or headache.   albuterol 108 (90 Base) MCG/ACT inhaler Commonly known as: VENTOLIN HFA Inhale 2 puffs into the lungs every 6 (six) hours as needed for wheezing or shortness of breath.   ALPRAZolam 1 MG tablet Commonly known as: XANAX Take 1 tablet (1 mg total) by mouth 2 (two) times daily as needed for anxiety or sleep.   amLODipine 10 MG tablet Commonly known as: NORVASC Take 5 mg by mouth daily.   calcium-vitamin D 500-200 MG-UNIT tablet Commonly known as: OSCAL WITH D Take 1 tablet by mouth daily.   carvedilol 6.25 MG tablet Commonly  known as: COREG Take 1 tablet (6.25 mg total) by mouth 2 (two) times daily with a meal.   Eliquis 5 MG Tabs tablet Generic drug: apixaban Take 5 mg by mouth 2 (two) times daily.   ferrous sulfate 325 (65 FE) MG tablet Take 1 tablet (325 mg total) by mouth 2 (two) times daily with a meal.   gabapentin 100 MG capsule Commonly known as: NEURONTIN Take 200 mg by mouth 3 (three) times daily.   HYDROcodone-acetaminophen 7.5-325 MG tablet Commonly known as: NORCO Take 1 tablet by mouth every 6 (six) hours as needed for severe pain (pain score 7-10).   insulin glargine 100 UNIT/ML injection Commonly known as: LANTUS Inject 22 Units into the skin daily.   levETIRAcetam 500 MG tablet Commonly known as: KEPPRA Take 500 mg by mouth 2 (two) times daily.   omeprazole 40 MG capsule Commonly known as: PRILOSEC Take 40 mg by mouth daily.   pravastatin 40 MG tablet Commonly known as: PRAVACHOL Take 40 mg by mouth at bedtime.   PreserVision AREDS 2 Caps Take 1 capsule by mouth daily.   sacubitril-valsartan 24-26 MG Commonly known as: ENTRESTO Take 1 tablet by mouth 2 (two) times daily.  Discharge Care Instructions  (From admission, onward)         Start     Ordered   12/12/19 0000  Discharge wound care:    Comments: Per Orthopedic instructions   12/12/19 [id number]          If you experience worsening of your admission symptoms, develop shortness of breath, life threatening emergency, suicidal or homicidal thoughts you must seek medical attention immediately by calling 911 or calling your MD immediately  if symptoms less severe.  You Must read complete instructions/literature along with all the possible adverse reactions/side effects for all the Medicines you take and that have been prescribed to you. Take any new Medicines after you have completely understood and accept all the possible adverse reactions/side effects.   Please note  You were cared for by a  hospitalist during your hospital stay. If you have any questions about your discharge medications or the care you received while you were in the hospital after you are discharged, you can call the unit and asked to speak with the hospitalist on call if the hospitalist that took care of you is not available. Once you are discharged, your primary care physician will handle any further medical issues. Please note that NO REFILLS for any discharge medications will be authorized once you are discharged, as it is imperative that you return to your primary care physician (or establish a relationship with a primary care physician if you do not have one) for your aftercare needs so that they can reassess your need for medications and monitor your lab values. Today   SUBJECTIVE  No new complaints  VITAL SIGNS:  Blood pressure (!) 127/39, pulse 60, temperature 98.7 F (37.1 C), temperature source Oral, resp. rate 16, height 4\' 10"  (1.473 m), weight 77.1 kg, SpO2 94 %.  I/O:    Intake/Output Summary (Last 24 hours) at 12/12/2019 0821 Last data filed at 12/12/2019 0526 Gross per 24 hour  Intake --  Output 650 ml  Net -650 ml    PHYSICAL EXAMINATION:  GENERAL:  86 y.o.-year-old patient lying in the bed with no acute distress.  EYES: Pupils equal, round, reactive to light and accommodation. No scleral icterus.  HEENT: Head atraumatic, normocephalic. Oropharynx and nasopharynx clear.  NECK:  Supple, no jugular venous distention. No thyroid enlargement, no tenderness.  LUNGS: Normal breath sounds bilaterally, no wheezing, rales,rhonchi or crepitation. No use of accessory muscles of respiration.  CARDIOVASCULAR: S1, S2 normal. No murmurs, rubs, or gallops.  ABDOMEN: Soft, non-tender, non-distended. Bowel sounds present. No organomegaly or mass.  EXTREMITIES: No pedal edema, cyanosis, or clubbing. Right surgical cast+ NEUROLOGIC: Cranial nerves II through XII are intact. Muscle strength 5/5 in all  extremities. Sensation intact. Gait not checked.  PSYCHIATRIC: The patient is alert and oriented x 3.  SKIN: No obvious rash, lesion, or ulcer.   DATA REVIEW:   CBC  Recent Labs  Lab 12/12/19 0619  WBC 8.0  HGB 7.9*  HCT 24.4*  PLT 210    Chemistries  Recent Labs  Lab 12/07/19 0437  NA 139  K 3.8  CL 105  CO2 25  GLUCOSE 69*  BUN 29*  CREATININE 0.92  CALCIUM 9.2    Microbiology Results   Recent Results (from the past 240 hour(s))  SARS Coronavirus 2 by RT PCR (hospital order, performed in [facility] hospital lab) Nasopharyngeal Nasopharyngeal Swab     Status: None   Collection Time: 12/06/19  6:29 PM   Specimen: Nasopharyngeal  Swab  Result Value Ref Range Status   SARS Coronavirus 2 NEGATIVE NEGATIVE Final    Comment: (NOTE) SARS-CoV-2 target nucleic acids are NOT DETECTED. The SARS-CoV-2 RNA is generally detectable in upper and lower respiratory specimens during the acute phase of infection. The lowest concentration of SARS-CoV-2 viral copies this assay can detect is 250 copies / mL. A negative result does not preclude SARS-CoV-2 infection and should not be used as the sole basis for treatment or other patient management decisions.  A negative result may occur with improper specimen collection / handling, submission of specimen other than nasopharyngeal swab, presence of viral mutation(s) within the areas targeted by this assay, and inadequate number of viral copies (<250 copies / mL). A negative result must be combined with clinical observations, patient history, and epidemiological information. Fact Sheet for Patients:   StrictlyIdeas.no Fact Sheet for Healthcare Providers: BankingDealers.co.za This test is not yet approved or cleared  by the [place] FDA and has been authorized for detection and/or diagnosis of SARS-CoV-2 by FDA under an Emergency Use Authorization (EUA).  This EUA will remain in effect  (meaning this test can be used) for the duration of the COVID-19 declaration under Section 564(b)(1) of the Act, 21 U.S.C. section 360bbb-3(b)(1), unless the authorization is terminated or revoked sooner. Performed at [facility], [address] [place], [place] [address]     RADIOLOGY:  No results found.   CODE STATUS:     Code Status Orders  (From admission, onward)         Start     Ordered   12/06/19 2033  Full code  Continuous     12/06/19 2032        Code Status History    Date Active Date Inactive Code Status Order ID Comments User Context   02/09/2018 1641 02/12/2018 1731 Full Code [id number]  Hessie Knows, MD Inpatient   01/26/2017 2100 01/28/2017 1536 DNR [id number]  Loletha Grayer, MD ED   07/26/2016 1616 07/28/2016 1926 Full Code [id number]  Idelle Crouch, MD Inpatient   07/25/2015 1709 07/26/2015 1801 Full Code [id number]  Aldean Jewett, MD Inpatient   03/14/2015 1433 03/15/2015 1832 Full Code [id number]  Isaias Cowman, MD Inpatient   07/27/2014 1537 07/31/2014 2233 Full Code [id number]  Penelope Coop Inpatient   07/27/2014 0505 07/27/2014 1537 Full Code [id number]  Etta Quill, DO ED   Advance Care Planning Activity       TOTAL TIME TAKING CARE OF THIS PATIENT: *40* minutes.    Fritzi Mandes M.D  [facility]    CC: Primary care physician; Idelle Crouch, MD

## 2019-12-12 NOTE — Progress Notes (Signed)
Physical Therapy Treatment Patient Details Name: Amber [name] MRN: [id number] DOB: 1929-12-23 Today's Date: 12/12/2019    History of Present Illness Pt is an 86 y.o. female with a known history of anemia, anxiety, arthritis, CHF, chronic kidney disease, coronary artery disease, diabetes, hypertension, hyperlipidemia, PPM presents to the emergency department for evaluation of syncope.  Patient was in her usual state of health when she was getting dressed she believes she lost consciousness and woke up on the floor.  Pt diagnosed with R tibial shaft fracture and is s/p ORIF.    PT Comments    Participated in exercises as described below.  To EOB with mod a x 1.  Steady in sitting. Attempted standing x 1 with RW and mod a x 2.  She is able to stand fully briefly while maintaining TTWB with good recall.  HR does drop to high 40's with standing but returns quickly to baseline once sitting.  RN notified of HR drop with standing attempts   She does report some mild dizziness.  Returns to supine with mod a x 2.   Follow Up Recommendations  SNF     Equipment Recommendations  Rolling walker with 5" wheels;Wheelchair (measurements PT);Wheelchair cushion (measurements PT)    Recommendations for Other Services       Precautions / Restrictions Precautions Precautions: Fall Restrictions Weight Bearing Restrictions: Yes RLE Weight Bearing: Touchdown weight bearing    Mobility  Bed Mobility Overal bed mobility: Needs Assistance Bed Mobility: Sit to Supine;Supine to Sit Rolling: Min assist   Supine to sit: Min assist;Mod assist Sit to supine: Min assist;Mod assist   General bed mobility comments: Good effort,  Transfers Overall transfer level: Needs assistance Equipment used: Rolling walker (2 wheeled) Transfers: Sit to/from Stand Sit to Stand: Mod assist;+2 physical assistance;From elevated surface         General transfer comment: Stood briefly and with good ability to  maintain TTWB.  HR did drop with standing to high 40's but recovered to baseline with sitting.  Ambulation/Gait             General Gait Details: unsafe/unable to progress at this time   Stairs             Wheelchair Mobility    Modified Rankin (Stroke Patients Only)       Balance Overall balance assessment: Needs assistance Sitting-balance support: Feet supported Sitting balance-Leahy Scale: Good     Standing balance support: Bilateral upper extremity supported Standing balance-Leahy Scale: Poor Standing balance comment: +2 assist only briefly                            Cognition Arousal/Alertness: Awake/alert Behavior During Therapy: WFL for tasks assessed/performed Overall Cognitive Status: Within Functional Limits for tasks assessed                                 General Comments: Pt is A and O x 4.      Exercises Total Joint Exercises Ankle Circles/Pumps: AROM;Strengthening;Left;5 reps;10 reps Quad Sets: Strengthening;Both;10 reps;5 reps Gluteal Sets: Strengthening;Both;5 reps;10 reps Heel Slides: Both;10 reps;Strengthening;AROM Hip ABduction/ADduction: Strengthening;Both;10 reps;AROM Straight Leg Raises: Strengthening;Both;10 reps;AROM Long Arc Quad: AROM;Strengthening;Both;20 reps    General Comments        Pertinent Vitals/Pain Pain Assessment: Faces Faces Pain Scale: Hurts a little bit Pain Location: RLE Pain Descriptors / Indicators: Sore Pain  Intervention(s): Limited activity within patient's tolerance;Monitored during session    Home Living                      Prior Function            PT Goals (current goals can now be found in the care plan section) Progress towards PT goals: Progressing toward goals    Frequency    BID      PT Plan Current plan remains appropriate    Co-evaluation              AM-PAC PT "6 Clicks" Mobility   Outcome Measure  Help needed turning from  your back to your side while in a flat bed without using bedrails?: A Little Help needed moving from lying on your back to sitting on the side of a flat bed without using bedrails?: A Little Help needed moving to and from a bed to a chair (including a wheelchair)?: Total Help needed standing up from a chair using your arms (e.g., wheelchair or bedside chair)?: Total Help needed to walk in hospital room?: Total Help needed climbing 3-5 steps with a railing? : Total 6 Click Score: 10    End of Session Equipment Utilized During Treatment: Gait belt Activity Tolerance: Patient tolerated treatment well Patient left: in bed;with call bell/phone within reach;with family/visitor present;with bed alarm set Nurse Communication: Mobility status;Precautions;Weight bearing status Pain - Right/Left: Right Pain - part of body: Leg     Time: 1541-1600 PT Time Calculation (min) (ACUTE ONLY): 19 min  Charges:  $Therapeutic Exercise: 8-22 mins                    Chesley Noon, PTA 12/12/19, 4:41 PM

## 2019-12-13 DIAGNOSIS — Z9889 Other specified postprocedural states: Secondary | ICD-10-CM

## 2019-12-13 DIAGNOSIS — E861 Hypovolemia: Secondary | ICD-10-CM

## 2019-12-13 DIAGNOSIS — I9589 Other hypotension: Secondary | ICD-10-CM

## 2019-12-13 DIAGNOSIS — T1490XA Injury, unspecified, initial encounter: Secondary | ICD-10-CM

## 2019-12-13 DIAGNOSIS — D649 Anemia, unspecified: Secondary | ICD-10-CM

## 2019-12-13 DIAGNOSIS — Z8781 Personal history of (healed) traumatic fracture: Secondary | ICD-10-CM

## 2019-12-13 LAB — CREATININE, SERUM
Creatinine, Ser: 0.7 mg/dL (ref 0.44–1.00)
GFR calc Af Amer: >60 mL/min (ref >60)
GFR calc non Af Amer: >60 mL/min (ref >60)

## 2019-12-13 LAB — GLUCOSE, CAPILLARY
Glucose-Capillary: 149 mg/dL — ABNORMAL HIGH (ref 70–99)
Glucose-Capillary: 129 mg/dL — ABNORMAL HIGH (ref 70–99)
Glucose-Capillary: 120 mg/dL — ABNORMAL HIGH (ref 70–99)
Glucose-Capillary: 127 mg/dL — ABNORMAL HIGH (ref 70–99)

## 2019-12-13 NOTE — Progress Notes (Signed)
Physical Therapy Treatment Patient Details Name: Amber [name] MRN: [id number] DOB: Mar 29, 1930 Today's Date: 12/13/2019    History of Present Illness Pt is an 86 y.o. female with a known history of anemia, anxiety, arthritis, CHF, chronic kidney disease, coronary artery disease, diabetes, hypertension, hyperlipidemia, PPM presents to the emergency department for evaluation of syncope.  Patient was in her usual state of health when she was getting dressed she believes she lost consciousness and woke up on the floor.  Pt diagnosed with R tibial shaft fracture and is s/p ORIF.    PT Comments    Co-tx with OT.  Overlap of time.  1 Unit billed PT, 2 OT per protocol  Pt on bed pan with OT when arrived.  Rolling off with min guard/assist with use of rails.  Care provided for large soft BM then pt had another small soft BM needing replacement of bedpan.  To EOB with min/mod a x 1 with improved abilities today. Steady in sitting for bathing and OT care.  Telemetry box in room replaced for HR monitoring due to dec HR yesterday.  She is steady today with activity but did increase briefly to 110's on one stand but reading may have been inaccurate.  No c/o dizziness today like yesterday.  Removed after session.  She is able to stand x 4 with mod/max a x 2 with increasing assist as she fatigued.  She only stands for brief periods of time and will sit quickly with little or no warning when she sits.  She is encouraged to try to pivot with RW to recliner but she is unable to do so. Bedding changed due to inc urine.   Returned to supine with mod a x 1 and max a x 2 to pull up in bed.    Pt continues with extensive assist to stand.  While she does well with short stands maintaining TTWB she fatigues quickly and with BUE weakness/arthritis it will be difficulty for her to maintain TTWB in standing to attempt/complete transfer with RW.  A lift would be appropriate at this time for pt and staff safety for OOB needs  with nursing.  She may do well with a lateral scoot transfer to drop arm chair to allow for increased independence.   Follow Up Recommendations  SNF     Equipment Recommendations  Rolling walker with 5" wheels;Wheelchair (measurements PT);Wheelchair cushion (measurements PT)    Recommendations for Other Services       Precautions / Restrictions Precautions Precautions: Fall Restrictions Weight Bearing Restrictions: Yes RLE Weight Bearing: Touchdown weight bearing    Mobility  Bed Mobility Overal bed mobility: Needs Assistance Bed Mobility: Sit to Supine;Supine to Sit Rolling: Min assist   Supine to sit: Min assist;Mod assist Sit to supine: Min assist;Mod assist   General bed mobility comments: Good effort,  Transfers Overall transfer level: Needs assistance Equipment used: Rolling walker (2 wheeled) Transfers: Sit to/from Stand Sit to Stand: Mod assist;+2 physical assistance;From elevated surface;Max assist         General transfer comment: Stood briefly x 4 with good ability to maintain TTWB. She is unable to attemtp pivot with RW and sits quickly with little not warning each attempt.  Ambulation/Gait             General Gait Details: unsafe/unable to progress at this time   Stairs             Wheelchair Mobility    Modified Rankin (Stroke Patients  Only)       Balance Overall balance assessment: Needs assistance Sitting-balance support: Feet supported Sitting balance-Leahy Scale: Good     Standing balance support: Bilateral upper extremity supported Standing balance-Leahy Scale: Poor Standing balance comment: +2 assist only briefly                            Cognition Arousal/Alertness: Awake/alert Behavior During Therapy: WFL for tasks assessed/performed Overall Cognitive Status: Within Functional Limits for tasks assessed                                 General Comments: Pt is A and O x 4.       Exercises      General Comments        Pertinent Vitals/Pain Pain Assessment: Faces Faces Pain Scale: Hurts a little bit Pain Location: RLE Pain Descriptors / Indicators: Sore Pain Intervention(s): Limited activity within patient's tolerance;Monitored during session;Repositioned    Home Living                      Prior Function            PT Goals (current goals can now be found in the care plan section) Progress towards PT goals: Progressing toward goals    Frequency    BID      PT Plan Current plan remains appropriate    Co-evaluation PT/OT/SLP Co-Evaluation/Treatment: Yes Reason for Co-Treatment: Complexity of the patient's impairments (multi-system involvement);For patient/therapist safety PT goals addressed during session: Mobility/safety with mobility;Balance OT goals addressed during session: ADL's and self-care      AM-PAC PT "6 Clicks" Mobility   Outcome Measure  Help needed turning from your back to your side while in a flat bed without using bedrails?: A Little Help needed moving from lying on your back to sitting on the side of a flat bed without using bedrails?: A Little Help needed moving to and from a bed to a chair (including a wheelchair)?: Total Help needed standing up from a chair using your arms (e.g., wheelchair or bedside chair)?: A Lot Help needed to walk in hospital room?: Total Help needed climbing 3-5 steps with a railing? : Total 6 Click Score: 11    End of Session Equipment Utilized During Treatment: Gait belt Activity Tolerance: Patient tolerated treatment well Patient left: in bed;with call bell/phone within reach;with family/visitor present;with bed alarm set Nurse Communication: Mobility status;Precautions;Weight bearing status Pain - Right/Left: Right Pain - part of body: Leg     Time: 1610-9604 PT Time Calculation (min) (ACUTE ONLY): 33 min  Charges:  $Therapeutic Activity: 8-22 mins                      Chesley Noon, PTA 12/13/19, 9:53 AM

## 2019-12-13 NOTE — Progress Notes (Signed)
Occupational Therapy Treatment Patient Details Name: Amber [name] MRN: [id number] DOB: 24-Jun-1930 Today's Date: 12/13/2019    History of present illness Pt is an 86 y.o. female with a known history of anemia, anxiety, arthritis, CHF, chronic kidney disease, coronary artery disease, diabetes, hypertension, hyperlipidemia, PPM presents to the emergency department for evaluation of syncope.  Patient was in her usual state of health when she was getting dressed she believes she lost consciousness and woke up on the floor.  Pt diagnosed with R tibial shaft fracture and is s/p ORIF.   OT comments  Pt seen for OT treatment this date to f/u re: safety with self care ADLs/ADL mobility. Pt performs sup to sit with MIN/MOD A +2, demos G static sitting balance EOB. OT engages pt in UB self care ADLs with MIN A d/t baseline limited shld ROM with OT encouraging pt to complete as much of task as she can and modify by "propping" UEs on bed rail while using opposite UE to complete bathing/dressing task. OT and PTA work together to facilitate pt participation in 4 standing trials with MOD/MAX A +2 with RW. Pt demos poor eccentric control and sits w/o warning on each trial despite education. In addition, OT facilitates education re: safe hand placement relative to RW use while PTA facilitates education re: maintaining WB. Overall, pt continues to be a good candidate for SNF upon hospital d/c.    Follow Up Recommendations  SNF    Equipment Recommendations  Other (comment)(defer to next venue)    Recommendations for Other Services      Precautions / Restrictions Precautions Precautions: Fall Restrictions Weight Bearing Restrictions: Yes RLE Weight Bearing: Touchdown weight bearing       Mobility Bed Mobility Overal bed mobility: Needs Assistance Bed Mobility: Sit to Supine;Supine to Sit Rolling: Min assist   Supine to sit: Min assist;Mod assist;+2 for physical assistance Sit to supine: Min  assist;Mod assist;+2 for physical assistance   General bed mobility comments: increased time, assist for LEs.  Transfers Overall transfer level: Needs assistance Equipment used: Rolling walker (2 wheeled) Transfers: Sit to/from Stand Sit to Stand: Mod assist;+2 physical assistance;From elevated surface;Max assist         General transfer comment: 4 standing trials with PTA providing tactile cue for pt to maintain TTWB. Pt with poor eccentric control, sits w/o warning on each trial.    Balance Overall balance assessment: Needs assistance Sitting-balance support: Feet supported Sitting balance-Leahy Scale: Good     Standing balance support: Bilateral upper extremity supported Standing balance-Leahy Scale: Poor Standing balance comment: +2 assist to sustain                           ADL either performed or assessed with clinical judgement   ADL Overall ADL's : Needs assistance/impaired         Upper Body Bathing: Minimal assistance;Sitting Upper Body Bathing Details (indicate cue type and reason): MIN A to support UEs d/t limited ROM-shld flexion     Upper Body Dressing : Minimal assistance;Sitting Upper Body Dressing Details (indicate cue type and reason): MIN A to support UEs d/t limited ROM-shld flexion       Toilet Transfer Details (indicate cue type and reason): MIN/MOD A to use bridging technique to get on bed pan at bed level Toileting- Clothing Manipulation and Hygiene: Moderate assistance;Bed level Toileting - Clothing Manipulation Details (indicate cue type and reason): using lateral rolling technique, to perform  peri care in side-lying. Pt able to participate more in anterior peri care, but difficulty reaching posterior after BM.             Vision Patient Visual Report: No change from baseline     Perception     Praxis      Cognition Arousal/Alertness: Awake/alert Behavior During Therapy: WFL for tasks assessed/performed Overall  Cognitive Status: Within Functional Limits for tasks assessed                                 General Comments: Pt is A and O x 4.        Exercises Other Exercises Other Exercises: OT engages pt in UB ADLs while seated EOB to improve unsupported sitting tolerance/fxl activity tolerance. Pt tolerates sitting x~9-10 mins before standing trials.   Shoulder Instructions       General Comments      Pertinent Vitals/ Pain       Pain Assessment: Faces Faces Pain Scale: Hurts a little bit Pain Location: RLE Pain Descriptors / Indicators: Sore Pain Intervention(s): Limited activity within patient's tolerance;Monitored during session;Repositioned  Home Living                                          Prior Functioning/Environment              Frequency  Min 2X/week        Progress Toward Goals  OT Goals(current goals can now be found in the care plan section)  Progress towards OT goals: Progressing toward goals  Acute Rehab OT Goals Patient Stated Goal: To walk again and get back home OT Goal Formulation: With patient Time For Goal Achievement: 12/24/19 Potential to Achieve Goals: Good  Plan Discharge plan remains appropriate    Co-evaluation    PT/OT/SLP Co-Evaluation/Treatment: Yes Reason for Co-Treatment: Complexity of the patient's impairments (multi-system involvement);For patient/therapist safety;To address functional/ADL transfers PT goals addressed during session: Mobility/safety with mobility;Balance OT goals addressed during session: ADL's and self-care;Proper use of Adaptive equipment and DME      AM-PAC OT "6 Clicks" Daily Activity     Outcome Measure   Help from another person eating meals?: None Help from another person taking care of personal grooming?: A Little Help from another person toileting, which includes using toliet, bedpan, or urinal?: A Lot Help from another person bathing (including washing, rinsing,  drying)?: A Lot Help from another person to put on and taking off regular upper body clothing?: A Little Help from another person to put on and taking off regular lower body clothing?: A Lot 6 Click Score: 16    End of Session Equipment Utilized During Treatment: Gait belt;Rolling walker  OT Visit Diagnosis: Unsteadiness on feet (R26.81);Muscle weakness (generalized) (M62.81);History of falling (Z91.81)   Activity Tolerance Patient tolerated treatment well   Patient Left in chair;with call bell/phone within reach;with chair alarm set   Nurse Communication Mobility status        Time: 0388-8280 OT Time Calculation (min): 38 min  Charges: OT General Charges $OT Visit: 1 Visit OT Treatments $Self Care/Home Management : 8-22 mins $Therapeutic Activity: 8-22 mins  Gerrianne Scale, [place], OTR/L ascom [phone] 12/13/19, 10:54 AM

## 2019-12-13 NOTE — Progress Notes (Addendum)
  Subjective: 5 Days Post-Op Procedure(s) (LRB): HARDWARE REMOVAL (Right) OPEN REDUCTION INTERNAL FIXATION (ORIF) TIBIA FRACTURE (Right) Patient reports pain as well-controlled.   Patient is well, and has had no acute complaints or problems Plan is to go Skilled nursing facility after hospital stay. Negative for chest pain and shortness of breath Gastrointestinal: negative for nausea and vomiting.   Patient has had a bowel movement.  Objective: Vital signs in last 24 hours: Temp:  [98.2 F (36.8 C)-98.8 F (37.1 C)] 98.2 F (36.8 C) (06/08 0743) Pulse Rate:  [58-68] 58 (06/08 0743) Resp:  [14-20] 14 (06/08 0743) BP: (136-182)/(50-65) 182/65 (06/08 0743) SpO2:  [93 %-95 %] 95 % (06/08 0743)  Intake/Output from previous day: No intake or output data in the 24 hours ending 12/13/19 0943  Intake/Output this shift: No intake/output data recorded.  Labs: Recent Labs    12/12/19 0619  HGB 7.9*   Recent Labs    12/12/19 0619  WBC 8.0  RBC 2.69*  HCT 24.4*  PLT 210   Recent Labs    12/13/19 0339  CREATININE 0.70   No results for input(s): LABPT, INR in the last 72 hours.   EXAM General - Patient is Alert, Appropriate and Oriented Extremity - Neurovascular intact Dorsiflexion/Plantar flexion intact Compartment soft Dressing/Incision -CDI Motor Function - intact, moving foot and toes well on exam.    Assessment/Plan: 5 Days Post-Op Procedure(s) (LRB): HARDWARE REMOVAL (Right) OPEN REDUCTION INTERNAL FIXATION (ORIF) TIBIA FRACTURE (Right) Active Problems:   Syncope and collapse   Closed displaced fracture of right tibial tuberosity   Pain   S/P ORIF (open reduction internal fixation) fracture   Hypotension due to hypovolemia   Injury  Estimated body mass index is 35.53 kg/m as calculated from the following:   Height as of this encounter: 4\' 10"  (1.473 m).   Weight as of this encounter: 77.1 kg. Advance diet Up with therapy  VSS Labs stable. Hgb 7.9.  Continue with oral iron supplement Follow up with [place] [facility] 2 weeks post op Keep incision and dressing sites clean and dry  DVT Prophylaxis - Eliquis Toe touch weight-bearing right leg  T. Billey Co, PA-C [facility] 12/13/2019, 9:43 AM

## 2019-12-13 NOTE — Discharge Summary (Signed)
Amber [name] at [place] NAME: Amber [name]    MR#:  [id number]  DATE OF BIRTH:  [birth year]  DATE OF ADMISSION:  12/06/2019 ADMITTING PHYSICIAN: Harvie Bridge, DO  DATE OF DISCHARGE: 12/13/2019  PRIMARY CARE PHYSICIAN: Idelle Crouch, MD    ADMISSION DIAGNOSIS:  Syncope and collapse [R55] Injury [T14.90XA] Pain [R52] Closed displaced fracture of right tibial tuberosity, initial encounter [S82.151A]  DISCHARGE DIAGNOSIS:  Right tibial shaft fracture  S/p ORIF and hardware removal Syncope suspected due to Relative hypotenison--resolved SECONDARY DIAGNOSIS:   Past Medical History:  Diagnosis Date  . Anemia   . Anxiety   . Arthritis   . Benign neoplasm of colon   . CHF (congestive heart failure) ([place])   . Chronic kidney disease   . Chronic pain   . Complication of anesthesia 1980's   hard time waking up  . Coronary artery disease with unspecified angina pectoris   . Cough   . Diabetes mellitus without complication ([place])   . Essential hypertension   . High risk medication use   . Hyperlipidemia   . Plantar fascial fibromatosis   . Presence of permanent cardiac pacemaker   . Shortness of breath dyspnea     HOSPITAL COURSE:   Amber [name] a 86 y.o.femalewith a known history of anemia, anxiety, arthritis, CHF, chronic kidney disease, coronary artery disease, diabetes, hypertension, hyperlipidemia, PPMpresents to the emergency department for evaluation of syncope. Patient was in a usual state of health until this morning when she was getting dressed she believes she lost consciousness, unclear for how long she was down but woke up on the floor.  #.Syncope--unclear etiology Relative Hypotension - telemetry monitoring--HR 55-68 /min - received IV fluid hydration--d/ced -BP dropped with narcotics--recovered with IVF -seen by Dr Josefa Half -resumed bp meds now  #.Mildly displaced right distal tibial shaft  fracture Management per Ortho dr Rudene Christians --s/p ORIF right tibial fracture with hardware removal  12/08/19 POD# 5 -Pain control with porn po meds -PT recommends rehab -TOC to obtained insurance auth--[facility] -Hgb 11--7.9. no active bleeding noted. Pt on eliquis. Appears post -op anemia and pt got some IV fluid bolus also for hypotension als and could be some dilutional -start po iron pills  #.H/o Diabetes,well controlled - Accuchecks achs with RISS coverage - Heart healthy, carb controlled diet - Insulin Lantus  22 units daily   #. History ofhypertension - on  Norvasc, Coreg, Lasix, Entresto at home -resumed it now since BP better  #. History ofhyperlipidemia - Continuepravastatin  #. History ofGERD - ContinueProtonix  # Seizure disorder -cont keppra as before  Admission status:Inpatient  Diet/Nutrition:Heart healthy, carb controlled Consults called: cardiology-[facility], Ortho-[facility] DVT YQ:IHKVQQV Code Status:Full Code Disposition Plan: Rehab  Status is: Inpatient  Remains inpatient appropriate because:Inpatient level of care appropriate due to severity of illness   Dispo: The patient is from: Home  Anticipated d/c is ZD:GLOV  Anticipated d/c date is: pt is READY!  Patient currently is currently stable for d/c s/p ORIF right LE ,d/c once insurance authorization obtained -TOC aware Spoke with dter [name] on Sunday  CONSULTS OBTAINED:  Treatment Team:  Hessie Knows, MD  DRUG ALLERGIES:   Allergies  Allergen Reactions  . Ambien [Zolpidem Tartrate] Other (See Comments)    Reaction:  Keeps pt awake   . Penicillins Itching and Other (See Comments)    Has patient had a PCN reaction causing immediate rash, facial/tongue/throat swelling, SOB or lightheadedness with  hypotension: No Has patient had a PCN reaction causing severe rash involving mucus membranes or skin necrosis: No Has patient had a PCN  reaction that required hospitalization No Has patient had a PCN reaction occurring within the last 10 years: No If all of the above answers are "NO", then may proceed with Cephalosporin use.  . Iodine Itching  . Succinylcholine Other (See Comments)    Reaction:  Unknown   . Etodolac Other (See Comments)    GI upset  . Nsaids Other (See Comments)    Reaction: gi upset    DISCHARGE MEDICATIONS:   Allergies as of 12/13/2019      Reactions   Ambien [zolpidem Tartrate] Other (See Comments)   Reaction:  Keeps pt awake    Penicillins Itching, Other (See Comments)   Has patient had a PCN reaction causing immediate rash, facial/tongue/throat swelling, SOB or lightheadedness with hypotension: No Has patient had a PCN reaction causing severe rash involving mucus membranes or skin necrosis: No Has patient had a PCN reaction that required hospitalization No Has patient had a PCN reaction occurring within the last 10 years: No If all of the above answers are "NO", then may proceed with Cephalosporin use.   Iodine Itching   Succinylcholine Other (See Comments)   Reaction:  Unknown    Etodolac Other (See Comments)   GI upset   Nsaids Other (See Comments)   Reaction: gi upset      Medication List    TAKE these medications   acetaminophen 500 MG tablet Commonly known as: TYLENOL Take 1,000 mg by mouth every 6 (six) hours as needed for mild pain or headache.   albuterol 108 (90 Base) MCG/ACT inhaler Commonly known as: VENTOLIN HFA Inhale 2 puffs into the lungs every 6 (six) hours as needed for wheezing or shortness of breath.   ALPRAZolam 1 MG tablet Commonly known as: XANAX Take 1 tablet (1 mg total) by mouth 2 (two) times daily as needed for anxiety or sleep.   amLODipine 10 MG tablet Commonly known as: NORVASC Take 5 mg by mouth daily.   calcium-vitamin D 500-200 MG-UNIT tablet Commonly known as: OSCAL WITH D Take 1 tablet by mouth daily.   carvedilol 6.25 MG tablet Commonly  known as: COREG Take 1 tablet (6.25 mg total) by mouth 2 (two) times daily with a meal.   Eliquis 5 MG Tabs tablet Generic drug: apixaban Take 5 mg by mouth 2 (two) times daily.   ferrous sulfate 325 (65 FE) MG tablet Take 1 tablet (325 mg total) by mouth 2 (two) times daily with a meal.   gabapentin 100 MG capsule Commonly known as: NEURONTIN Take 200 mg by mouth 3 (three) times daily.   HYDROcodone-acetaminophen 7.5-325 MG tablet Commonly known as: NORCO Take 1 tablet by mouth every 6 (six) hours as needed for severe pain (pain score 7-10).   insulin glargine 100 UNIT/ML injection Commonly known as: LANTUS Inject 22 Units into the skin daily.   levETIRAcetam 500 MG tablet Commonly known as: KEPPRA Take 500 mg by mouth 2 (two) times daily.   omeprazole 40 MG capsule Commonly known as: PRILOSEC Take 40 mg by mouth daily.   pravastatin 40 MG tablet Commonly known as: PRAVACHOL Take 40 mg by mouth at bedtime.   PreserVision AREDS 2 Caps Take 1 capsule by mouth daily.   sacubitril-valsartan 24-26 MG Commonly known as: ENTRESTO Take 1 tablet by mouth 2 (two) times daily.  Discharge Care Instructions  (From admission, onward)         Start     Ordered   12/12/19 0000  Discharge wound care:    Comments: Per Orthopedic instructions   12/12/19 [phone]          If you experience worsening of your admission symptoms, develop shortness of breath, life threatening emergency, suicidal or homicidal thoughts you must seek medical attention immediately by calling 911 or calling your MD immediately  if symptoms less severe.  You Must read complete instructions/literature along with all the possible adverse reactions/side effects for all the Medicines you take and that have been prescribed to you. Take any new Medicines after you have completely understood and accept all the possible adverse reactions/side effects.   Please note  You were cared for by a  hospitalist during your hospital stay. If you have any questions about your discharge medications or the care you received while you were in the hospital after you are discharged, you can call the unit and asked to speak with the hospitalist on call if the hospitalist that took care of you is not available. Once you are discharged, your primary care physician will handle any further medical issues. Please note that NO REFILLS for any discharge medications will be authorized once you are discharged, as it is imperative that you return to your primary care physician (or establish a relationship with a primary care physician if you do not have one) for your aftercare needs so that they can reassess your need for medications and monitor your lab values. Today   SUBJECTIVE  No new complaints  VITAL SIGNS:  Blood pressure (!) 182/65, pulse (!) 58, temperature 98.2 F (36.8 C), resp. rate 14, height 4\' 10"  (1.473 m), weight 77.1 kg, SpO2 95 %.  I/O:    Intake/Output Summary (Last 24 hours) at 12/13/2019 0819 Last data filed at 12/12/2019 0900 Gross per 24 hour  Intake 120 ml  Output --  Net 120 ml    PHYSICAL EXAMINATION:  GENERAL:  86 y.o.-year-old patient lying in the bed with no acute distress.  EYES: Pupils equal, round, reactive to light and accommodation. No scleral icterus.  HEENT: Head atraumatic, normocephalic. Oropharynx and nasopharynx clear.  NECK:  Supple, no jugular venous distention. No thyroid enlargement, no tenderness.  LUNGS: Normal breath sounds bilaterally, no wheezing, rales,rhonchi or crepitation. No use of accessory muscles of respiration.  CARDIOVASCULAR: S1, S2 normal. No murmurs, rubs, or gallops.  ABDOMEN: Soft, non-tender, non-distended. Bowel sounds present. No organomegaly or mass.  EXTREMITIES: No pedal edema, cyanosis, or clubbing. Right surgical cast+ NEUROLOGIC: Cranial nerves II through XII are intact. Muscle strength 5/5 in all extremities. Sensation intact.  Gait not checked.  PSYCHIATRIC: The patient is alert and oriented x 3.  SKIN: No obvious rash, lesion, or ulcer.   DATA REVIEW:   CBC  Recent Labs  Lab 12/12/19 0619  WBC 8.0  HGB 7.9*  HCT 24.4*  PLT 210    Chemistries  Recent Labs  Lab 12/07/19 0437 12/07/19 0437 12/13/19 0339  NA 139  --   --   K 3.8  --   --   CL 105  --   --   CO2 25  --   --   GLUCOSE 69*  --   --   BUN 29*  --   --   CREATININE 0.92   < > 0.70  CALCIUM 9.2  --   --    < > =  values in this interval not displayed.    Microbiology Results   Recent Results (from the past 240 hour(s))  SARS Coronavirus 2 by RT PCR (hospital order, performed in [facility] hospital lab) Nasopharyngeal Nasopharyngeal Swab     Status: None   Collection Time: 12/06/19  6:29 PM   Specimen: Nasopharyngeal Swab  Result Value Ref Range Status   SARS Coronavirus 2 NEGATIVE NEGATIVE Final    Comment: (NOTE) SARS-CoV-2 target nucleic acids are NOT DETECTED. The SARS-CoV-2 RNA is generally detectable in upper and lower respiratory specimens during the acute phase of infection. The lowest concentration of SARS-CoV-2 viral copies this assay can detect is 250 copies / mL. A negative result does not preclude SARS-CoV-2 infection and should not be used as the sole basis for treatment or other patient management decisions.  A negative result may occur with improper specimen collection / handling, submission of specimen other than nasopharyngeal swab, presence of viral mutation(s) within the areas targeted by this assay, and inadequate number of viral copies (<250 copies / mL). A negative result must be combined with clinical observations, patient history, and epidemiological information. Fact Sheet for Patients:   StrictlyIdeas.no Fact Sheet for Healthcare Providers: BankingDealers.co.za This test is not yet approved or cleared  by the [place] FDA and has been authorized  for detection and/or diagnosis of SARS-CoV-2 by FDA under an Emergency Use Authorization (EUA).  This EUA will remain in effect (meaning this test can be used) for the duration of the COVID-19 declaration under Section 564(b)(1) of the Act, 21 U.S.C. section 360bbb-3(b)(1), unless the authorization is terminated or revoked sooner. Performed at [facility], [address] [place], [place] [address]   SARS Coronavirus 2 by RT PCR (hospital order, performed in [facility] hospital lab) Nasopharyngeal Nasopharyngeal Swab     Status: None   Collection Time: 12/12/19 10:00 AM   Specimen: Nasopharyngeal Swab  Result Value Ref Range Status   SARS Coronavirus 2 NEGATIVE NEGATIVE Final    Comment: (NOTE) SARS-CoV-2 target nucleic acids are NOT DETECTED. The SARS-CoV-2 RNA is generally detectable in upper and lower respiratory specimens during the acute phase of infection. The lowest concentration of SARS-CoV-2 viral copies this assay can detect is 250 copies / mL. A negative result does not preclude SARS-CoV-2 infection and should not be used as the sole basis for treatment or other patient management decisions.  A negative result may occur with improper specimen collection / handling, submission of specimen other than nasopharyngeal swab, presence of viral mutation(s) within the areas targeted by this assay, and inadequate number of viral copies (<250 copies / mL). A negative result must be combined with clinical observations, patient history, and epidemiological information. Fact Sheet for Patients:   StrictlyIdeas.no Fact Sheet for Healthcare Providers: BankingDealers.co.za This test is not yet approved or cleared  by the [place] FDA and has been authorized for detection and/or diagnosis of SARS-CoV-2 by FDA under an Emergency Use Authorization (EUA).  This EUA will remain in effect (meaning this test can be used) for the  duration of the COVID-19 declaration under Section 564(b)(1) of the Act, 21 U.S.C. section 360bbb-3(b)(1), unless the authorization is terminated or revoked sooner. Performed at [facility], [address] [place], [place] [address]     RADIOLOGY:  No results found.   CODE STATUS:     Code Status Orders  (From admission, onward)         Start     Ordered  12/06/19 2033  Full code  Continuous     12/06/19 2032        Code Status History    Date Active Date Inactive Code Status Order ID Comments User Context   02/09/2018 1641 02/12/2018 1731 Full Code [id number]  Hessie Knows, MD Inpatient   01/26/2017 2100 01/28/2017 1536 DNR [id number]  Loletha Grayer, MD ED   07/26/2016 1616 07/28/2016 1926 Full Code [id number]  Idelle Crouch, MD Inpatient   07/25/2015 1709 07/26/2015 1801 Full Code [id number]  Aldean Jewett, MD Inpatient   03/14/2015 1433 03/15/2015 1832 Full Code [id number]  Isaias Cowman, MD Inpatient   07/27/2014 1537 07/31/2014 2233 Full Code [id number]  Penelope Coop Inpatient   07/27/2014 0505 07/27/2014 1537 Full Code [id number]  Etta Quill, DO ED   Advance Care Planning Activity       TOTAL TIME TAKING CARE OF THIS PATIENT: *40* minutes.    Fritzi Mandes M.D  [facility]    CC: Primary care physician; Idelle Crouch, MD

## 2019-12-14 DIAGNOSIS — K219 Gastro-esophageal reflux disease without esophagitis: Secondary | ICD-10-CM | POA: Diagnosis not present

## 2019-12-14 DIAGNOSIS — Z79899 Other long term (current) drug therapy: Secondary | ICD-10-CM | POA: Diagnosis not present

## 2019-12-14 DIAGNOSIS — I10 Essential (primary) hypertension: Secondary | ICD-10-CM | POA: Diagnosis not present

## 2019-12-14 DIAGNOSIS — R52 Pain, unspecified: Secondary | ICD-10-CM | POA: Diagnosis not present

## 2019-12-14 DIAGNOSIS — I9589 Other hypotension: Secondary | ICD-10-CM | POA: Diagnosis not present

## 2019-12-14 DIAGNOSIS — M2550 Pain in unspecified joint: Secondary | ICD-10-CM | POA: Diagnosis not present

## 2019-12-14 DIAGNOSIS — I2510 Atherosclerotic heart disease of native coronary artery without angina pectoris: Secondary | ICD-10-CM | POA: Diagnosis not present

## 2019-12-14 DIAGNOSIS — E861 Hypovolemia: Secondary | ICD-10-CM | POA: Diagnosis not present

## 2019-12-14 DIAGNOSIS — R488 Other symbolic dysfunctions: Secondary | ICD-10-CM | POA: Diagnosis not present

## 2019-12-14 DIAGNOSIS — F445 Conversion disorder with seizures or convulsions: Secondary | ICD-10-CM | POA: Diagnosis not present

## 2019-12-14 DIAGNOSIS — Z7401 Bed confinement status: Secondary | ICD-10-CM | POA: Diagnosis not present

## 2019-12-14 DIAGNOSIS — S82151D Displaced fracture of right tibial tuberosity, subsequent encounter for closed fracture with routine healing: Secondary | ICD-10-CM | POA: Diagnosis not present

## 2019-12-14 DIAGNOSIS — R55 Syncope and collapse: Secondary | ICD-10-CM | POA: Diagnosis not present

## 2019-12-14 DIAGNOSIS — E785 Hyperlipidemia, unspecified: Secondary | ICD-10-CM | POA: Diagnosis not present

## 2019-12-14 DIAGNOSIS — T1490XA Injury, unspecified, initial encounter: Secondary | ICD-10-CM | POA: Diagnosis not present

## 2019-12-14 DIAGNOSIS — R569 Unspecified convulsions: Secondary | ICD-10-CM | POA: Diagnosis not present

## 2019-12-14 DIAGNOSIS — S82151A Displaced fracture of right tibial tuberosity, initial encounter for closed fracture: Secondary | ICD-10-CM | POA: Diagnosis not present

## 2019-12-14 DIAGNOSIS — D649 Anemia, unspecified: Secondary | ICD-10-CM | POA: Diagnosis not present

## 2019-12-14 DIAGNOSIS — I509 Heart failure, unspecified: Secondary | ICD-10-CM | POA: Diagnosis not present

## 2019-12-14 DIAGNOSIS — Z9889 Other specified postprocedural states: Secondary | ICD-10-CM | POA: Diagnosis not present

## 2019-12-14 DIAGNOSIS — R319 Hematuria, unspecified: Secondary | ICD-10-CM | POA: Diagnosis not present

## 2019-12-14 DIAGNOSIS — Z4789 Encounter for other orthopedic aftercare: Secondary | ICD-10-CM | POA: Diagnosis not present

## 2019-12-14 DIAGNOSIS — I959 Hypotension, unspecified: Secondary | ICD-10-CM | POA: Diagnosis not present

## 2019-12-14 DIAGNOSIS — E119 Type 2 diabetes mellitus without complications: Secondary | ICD-10-CM | POA: Diagnosis not present

## 2019-12-14 DIAGNOSIS — M6281 Muscle weakness (generalized): Secondary | ICD-10-CM | POA: Diagnosis not present

## 2019-12-14 DIAGNOSIS — D508 Other iron deficiency anemias: Secondary | ICD-10-CM | POA: Diagnosis not present

## 2019-12-14 DIAGNOSIS — R262 Difficulty in walking, not elsewhere classified: Secondary | ICD-10-CM | POA: Diagnosis not present

## 2019-12-14 DIAGNOSIS — N390 Urinary tract infection, site not specified: Secondary | ICD-10-CM | POA: Diagnosis not present

## 2019-12-14 LAB — CBC
HCT: 23.9 % — ABNORMAL LOW (ref 36.0–46.0)
Hemoglobin: 7.8 g/dL — ABNORMAL LOW (ref 12.0–15.0)
MCH: 29 pg (ref 26.0–34.0)
MCHC: 32.6 g/dL (ref 30.0–36.0)
MCV: 88.8 fL (ref 80.0–100.0)
Platelets: 251 10*3/uL (ref 150–400)
RBC: 2.69 MIL/uL — ABNORMAL LOW (ref 3.87–5.11)
RDW: 14.6 % (ref 11.5–15.5)
WBC: 8.2 10*3/uL (ref 4.0–10.5)
nRBC: 0 % (ref 0.0–0.2)

## 2019-12-14 LAB — GLUCOSE, CAPILLARY: Glucose-Capillary: 122 mg/dL — ABNORMAL HIGH (ref 70–99)

## 2019-12-14 NOTE — Discharge Summary (Signed)
[facility] [place] NAME: Rosely Fernandez    MR#:  [id number]  DATE OF BIRTH:  1930-04-21  DATE OF ADMISSION:  12/06/2019 ADMITTING PHYSICIAN: Harvie Bridge, DO  DATE OF DISCHARGE: 12/14/2019  PRIMARY CARE PHYSICIAN: Idelle Crouch, MD    ADMISSION DIAGNOSIS:  Syncope and collapse [R55] Injury [T14.90XA] Pain [R52] Closed displaced fracture of right tibial tuberosity, initial encounter [S82.151A]  DISCHARGE DIAGNOSIS:  Right tibial shaft fracture  S/p ORIF and hardware removal Syncope suspected due to Relative hypotension--resolved SECONDARY DIAGNOSIS:   Past Medical History:  Diagnosis Date  . Anemia   . Anxiety   . Arthritis   . Benign neoplasm of colon   . CHF (congestive heart failure) ([place])   . Chronic kidney disease   . Chronic pain   . Complication of anesthesia 1980's   hard time waking up  . Coronary artery disease with unspecified angina pectoris   . Cough   . Diabetes mellitus without complication ([place])   . Essential hypertension   . High risk medication use   . Hyperlipidemia   . Plantar fascial fibromatosis   . Presence of permanent cardiac pacemaker   . Shortness of breath dyspnea     HOSPITAL COURSE:   Johara Lodwick a 86 y.o.femalewith a known history of anemia, anxiety, arthritis, CHF, chronic kidney disease, coronary artery disease, diabetes, hypertension, hyperlipidemia, PPMpresents to the emergency department for evaluation of syncope. Patient was in a usual state of health until this morning when she was getting dressed she believes she lost consciousness, unclear for how long she was down but woke up on the floor.  #.Syncope--unclear etiology Relative Hypotension - telemetry monitoring--HR 55-68 /min - received IV fluid hydration--d/ced -BP dropped with narcotics--recovered with IVF -seen by Dr Josefa Half -resumed bp meds now  #.Mildly displaced right distal tibial shaft  fracture Management per Ortho dr Rudene Christians --s/p ORIF right tibial fracture with hardware removal  12/08/19 POD# 6 -Pain control with porn po meds -PT recommends rehab -TOC reports recieiving insurance auth--family chose [facility] -Hgb 11--7.9. no active bleeding noted. Pt on eliquis. Appears post -op anemia and pt got some IV fluid bolus also for hypotension als and could be some dilutional -start po iron pills  #.H/o Diabetes,well controlled - Accuchecks achs with RISS coverage - Heart healthy, carb controlled diet - Insulin Lantus  22 units daily   #. History ofhypertension - on  Norvasc, Coreg, Lasix, Entresto at home -resumed it now since BP better  #. History ofhyperlipidemia - Continuepravastatin  #. History ofGERD - ContinueProtonix  # Seizure disorder -cont keppra as before  Admission status:Inpatient  Diet/Nutrition:Heart healthy, carb controlled Consults called: cardiology-[facility], Ortho-[facility] DVT XK:GYJEHUD Code Status:Full Code Disposition Plan: Rehab  Status is: Inpatient  Dispo: The patient is from: Home  Anticipated d/c is JS:HFWY  Anticipated d/c date is: pt is READY!  Patient currently is currently stable for d/c s/p ORIF right LE  D/c today-OK to go per [facility] Spoke with dter [name] on Sunday  CONSULTS OBTAINED:  Treatment Team:  Hessie Knows, MD  DRUG ALLERGIES:   Allergies  Allergen Reactions  . Ambien [Zolpidem Tartrate] Other (See Comments)    Reaction:  Keeps pt awake   . Penicillins Itching and Other (See Comments)    Has patient had a PCN reaction causing immediate rash, facial/tongue/throat swelling, SOB or lightheadedness with hypotension: No Has patient had a PCN reaction causing severe rash involving mucus membranes or  skin necrosis: No Has patient had a PCN reaction that required hospitalization No Has patient had a PCN reaction occurring within the last 10 years: No If all of the above  answers are "NO", then may proceed with Cephalosporin use.  . Iodine Itching  . Succinylcholine Other (See Comments)    Reaction:  Unknown   . Etodolac Other (See Comments)    GI upset  . Nsaids Other (See Comments)    Reaction: gi upset    DISCHARGE MEDICATIONS:   Allergies as of 12/14/2019      Reactions   Ambien [zolpidem Tartrate] Other (See Comments)   Reaction:  Keeps pt awake    Penicillins Itching, Other (See Comments)   Has patient had a PCN reaction causing immediate rash, facial/tongue/throat swelling, SOB or lightheadedness with hypotension: No Has patient had a PCN reaction causing severe rash involving mucus membranes or skin necrosis: No Has patient had a PCN reaction that required hospitalization No Has patient had a PCN reaction occurring within the last 10 years: No If all of the above answers are "NO", then may proceed with Cephalosporin use.   Iodine Itching   Succinylcholine Other (See Comments)   Reaction:  Unknown    Etodolac Other (See Comments)   GI upset   Nsaids Other (See Comments)   Reaction: gi upset      Medication List    TAKE these medications   acetaminophen 500 MG tablet Commonly known as: TYLENOL Take 1,000 mg by mouth every 6 (six) hours as needed for mild pain or headache.   albuterol 108 (90 Base) MCG/ACT inhaler Commonly known as: VENTOLIN HFA Inhale 2 puffs into the lungs every 6 (six) hours as needed for wheezing or shortness of breath.   ALPRAZolam 1 MG tablet Commonly known as: XANAX Take 1 tablet (1 mg total) by mouth 2 (two) times daily as needed for anxiety or sleep.   amLODipine 10 MG tablet Commonly known as: NORVASC Take 5 mg by mouth daily.   calcium-vitamin D 500-200 MG-UNIT tablet Commonly known as: OSCAL WITH D Take 1 tablet by mouth daily.   carvedilol 6.25 MG tablet Commonly known as: COREG Take 1 tablet (6.25 mg total) by mouth 2 (two) times daily with a meal.   Eliquis 5 MG Tabs tablet Generic drug:  apixaban Take 5 mg by mouth 2 (two) times daily.   ferrous sulfate 325 (65 FE) MG tablet Take 1 tablet (325 mg total) by mouth 2 (two) times daily with a meal.   gabapentin 100 MG capsule Commonly known as: NEURONTIN Take 200 mg by mouth 3 (three) times daily.   HYDROcodone-acetaminophen 7.5-325 MG tablet Commonly known as: NORCO Take 1 tablet by mouth every 6 (six) hours as needed for severe pain (pain score 7-10).   insulin glargine 100 UNIT/ML injection Commonly known as: LANTUS Inject 22 Units into the skin daily.   levETIRAcetam 500 MG tablet Commonly known as: KEPPRA Take 500 mg by mouth 2 (two) times daily.   omeprazole 40 MG capsule Commonly known as: PRILOSEC Take 40 mg by mouth daily.   pravastatin 40 MG tablet Commonly known as: PRAVACHOL Take 40 mg by mouth at bedtime.   PreserVision AREDS 2 Caps Take 1 capsule by mouth daily.   sacubitril-valsartan 24-26 MG Commonly known as: ENTRESTO Take 1 tablet by mouth 2 (two) times daily.            Discharge Care Instructions  (From admission, onward)  Start     Ordered   12/12/19 0000  Discharge wound care:    Comments: Per Orthopedic instructions   12/12/19 [phone]          If you experience worsening of your admission symptoms, develop shortness of breath, life threatening emergency, suicidal or homicidal thoughts you must seek medical attention immediately by calling 911 or calling your MD immediately  if symptoms less severe.  You Must read complete instructions/literature along with all the possible adverse reactions/side effects for all the Medicines you take and that have been prescribed to you. Take any new Medicines after you have completely understood and accept all the possible adverse reactions/side effects.   Please note  You were cared for by a hospitalist during your hospital stay. If you have any questions about your discharge medications or the care you received while you were in  the hospital after you are discharged, you can call the unit and asked to speak with the hospitalist on call if the hospitalist that took care of you is not available. Once you are discharged, your primary care physician will handle any further medical issues. Please note that NO REFILLS for any discharge medications will be authorized once you are discharged, as it is imperative that you return to your primary care physician (or establish a relationship with a primary care physician if you do not have one) for your aftercare needs so that they can reassess your need for medications and monitor your lab values. Today   SUBJECTIVE  No new complaints. I stood up 4 times yday!  VITAL SIGNS:  Blood pressure (!) 174/97, pulse 65, temperature 98.5 F (36.9 C), temperature source Oral, resp. rate 18, height 4\' 10"  (1.473 m), weight 77.1 kg, SpO2 95 %.  I/O:    Intake/Output Summary (Last 24 hours) at 12/14/2019 0845 Last data filed at 12/14/2019 0810 Gross per 24 hour  Intake 240 ml  Output 375 ml  Net -135 ml    PHYSICAL EXAMINATION:  GENERAL:  86 y.o.-year-old patient lying in the bed with no acute distress. Obese EYES: Pupils equal, round, reactive to light and accommodation. No scleral icterus.  HEENT: Head atraumatic, normocephalic. Oropharynx and nasopharynx clear.  NECK:  Supple, no jugular venous distention. No thyroid enlargement, no tenderness.  LUNGS: Normal breath sounds bilaterally, no wheezing, rales,rhonchi or crepitation. No use of accessory muscles of respiration.  CARDIOVASCULAR: S1, S2 normal. No murmurs, rubs, or gallops.  ABDOMEN: Soft, non-tender, non-distended. Bowel sounds present. No organomegaly or mass.  EXTREMITIES: No pedal edema, cyanosis, or clubbing. Right surgical cast+ NEUROLOGIC: Cranial nerves II through XII are intact. Muscle strength 5/5 in all extremities. Sensation intact. Gait not checked.  PSYCHIATRIC: The patient is alert and oriented x 3.  SKIN: No  obvious rash, lesion, or ulcer.   DATA REVIEW:   CBC  Recent Labs  Lab 12/14/19 0411  WBC 8.2  HGB 7.8*  HCT 23.9*  PLT 251    Chemistries  Recent Labs  Lab 12/13/19 0339  CREATININE 0.70    Microbiology Results   Recent Results (from the past 240 hour(s))  SARS Coronavirus 2 by RT PCR (hospital order, performed in [facility] hospital lab) Nasopharyngeal Nasopharyngeal Swab     Status: None   Collection Time: 12/06/19  6:29 PM   Specimen: Nasopharyngeal Swab  Result Value Ref Range Status   SARS Coronavirus 2 NEGATIVE NEGATIVE Final    Comment: (NOTE) SARS-CoV-2 target nucleic acids are NOT DETECTED. The SARS-CoV-2  RNA is generally detectable in upper and lower respiratory specimens during the acute phase of infection. The lowest concentration of SARS-CoV-2 viral copies this assay can detect is 250 copies / mL. A negative result does not preclude SARS-CoV-2 infection and should not be used as the sole basis for treatment or other patient management decisions.  A negative result may occur with improper specimen collection / handling, submission of specimen other than nasopharyngeal swab, presence of viral mutation(s) within the areas targeted by this assay, and inadequate number of viral copies (<250 copies / mL). A negative result must be combined with clinical observations, patient history, and epidemiological information. Fact Sheet for Patients:   StrictlyIdeas.no Fact Sheet for Healthcare Providers: BankingDealers.co.za This test is not yet approved or cleared  by the [place] FDA and has been authorized for detection and/or diagnosis of SARS-CoV-2 by FDA under an Emergency Use Authorization (EUA).  This EUA will remain in effect (meaning this test can be used) for the duration of the COVID-19 declaration under Section 564(b)(1) of the Act, 21 U.S.C. section 360bbb-3(b)(1), unless the authorization is  terminated or revoked sooner. Performed at [facility], [address] [place], [place] [address]   SARS Coronavirus 2 by RT PCR (hospital order, performed in [facility] hospital lab) Nasopharyngeal Nasopharyngeal Swab     Status: None   Collection Time: 12/12/19 10:00 AM   Specimen: Nasopharyngeal Swab  Result Value Ref Range Status   SARS Coronavirus 2 NEGATIVE NEGATIVE Final    Comment: (NOTE) SARS-CoV-2 target nucleic acids are NOT DETECTED. The SARS-CoV-2 RNA is generally detectable in upper and lower respiratory specimens during the acute phase of infection. The lowest concentration of SARS-CoV-2 viral copies this assay can detect is 250 copies / mL. A negative result does not preclude SARS-CoV-2 infection and should not be used as the sole basis for treatment or other patient management decisions.  A negative result may occur with improper specimen collection / handling, submission of specimen other than nasopharyngeal swab, presence of viral mutation(s) within the areas targeted by this assay, and inadequate number of viral copies (<250 copies / mL). A negative result must be combined with clinical observations, patient history, and epidemiological information. Fact Sheet for Patients:   StrictlyIdeas.no Fact Sheet for Healthcare Providers: BankingDealers.co.za This test is not yet approved or cleared  by the [place] FDA and has been authorized for detection and/or diagnosis of SARS-CoV-2 by FDA under an Emergency Use Authorization (EUA).  This EUA will remain in effect (meaning this test can be used) for the duration of the COVID-19 declaration under Section 564(b)(1) of the Act, 21 U.S.C. section 360bbb-3(b)(1), unless the authorization is terminated or revoked sooner. Performed at [facility], [address] [place], [place] [address]     RADIOLOGY:  No results found.   CODE STATUS:      Code Status Orders  (From admission, onward)         Start     Ordered   12/06/19 2033  Full code  Continuous     12/06/19 2032        Code Status History    Date Active Date Inactive Code Status Order ID Comments User Context   02/09/2018 1641 02/12/2018 1731 Full Code [id number]  Hessie Knows, MD Inpatient   01/26/2017 2100 01/28/2017 1536 DNR [id number]  Loletha Grayer, MD ED   07/26/2016 1616 07/28/2016 1926 Full Code [id number]  Idelle Crouch, MD Inpatient   07/25/2015 1709  07/26/2015 1801 Full Code [id number]  Aldean Jewett, MD Inpatient   03/14/2015 1433 03/15/2015 1832 Full Code [id number]  Isaias Cowman, MD Inpatient   07/27/2014 1537 07/31/2014 2233 Full Code [id number]  Penelope Coop Inpatient   07/27/2014 0505 07/27/2014 1537 Full Code [id number]  Etta Quill, DO ED   Advance Care Planning Activity       TOTAL TIME TAKING CARE OF THIS PATIENT: *40* minutes.    Fritzi Mandes M.D  [facility]    CC: Primary care physician; Idelle Crouch, MD

## 2019-12-14 NOTE — TOC Progression Note (Signed)
Transition of Care [facility]) - Progression Note    Patient Details  Name: Amber [name] MRN: [id number] Date of Birth: 1930/06/10  [facility]) CM/SW Contact  Shelbie Hutching, RN Phone Number: 12/14/2019, 8:44 AM  Clinical Narrative:    Insurance authorization received for SNF.  Patient will discharge to [facility].  This RNCM will arrange EMS transport.  Bedside RN will call report to [phone].   Expected Discharge Plan: [place] Barriers to Discharge: Continued Medical Work up  Expected Discharge Plan and Services Expected Discharge Plan: [place]   Discharge Planning Services: CM Consult Post Acute Care Choice: [place] Living arrangements for the past 2 months: Single Family Home Expected Discharge Date: 12/12/19                                     Social Determinants of Health (SDOH) Interventions    Readmission Risk Interventions No flowsheet data found.

## 2019-12-14 NOTE — TOC Transition Note (Signed)
Transition of Care [facility]) - CM/SW Discharge Note   Patient Details  Name: Annalynn Centanni MRN: [id number] Date of Birth: 05-19-1930  Transition of Care [facility]) CM/SW Contact:  Shelbie Hutching, RN Phone Number: 12/14/2019, 10:59 AM   Clinical Narrative:    Patient's daughter [name] updated no discharge today.  Bedside RN to call report.    Final next level of care: Skilled Nursing Facility Barriers to Discharge: Barriers Resolved   Patient Goals and CMS Choice Patient states their goals for this hospitalization and ongoing recovery are:: wants to be able to walk and not hurt CMS Medicare.gov Compare Post Acute Care list provided to:: Patient Choice offered to / list presented to : Patient  Discharge Placement PASRR number recieved: 12/09/19            Patient [facility] bed at: [facility] [place] Patient to be transferred to facility by: [facility] Name of family member notified: Alexas Basulto- daughter Patient and family notified of of transfer: 12/14/19  Discharge Plan and Services   Discharge Planning Services: CM Consult Post Acute Care Choice: [place]                               Social Determinants of Health (SDOH) Interventions     Readmission Risk Interventions No flowsheet data found.

## 2019-12-14 NOTE — Progress Notes (Signed)
Physical Therapy Treatment Patient Details Name: Amber [name] MRN: [id number] DOB: March 15, 1930 Today's Date: 12/14/2019    History of Present Illness Pt is an 86 y.o. female with a known history of anemia, anxiety, arthritis, CHF, chronic kidney disease, coronary artery disease, diabetes, hypertension, hyperlipidemia, PPM presents to the emergency department for evaluation of syncope.  Patient was in her usual state of health when she was getting dressed she believes she lost consciousness and woke up on the floor.  Pt diagnosed with R tibial shaft fracture and is s/p ORIF.    PT Comments    Pt is making good progress towards goals and is ready to dc this date. Supine there-ex performed on B LE. Pt appears very motivated. Ready to dc   Follow Up Recommendations  SNF     Equipment Recommendations  Rolling walker with 5" wheels;Wheelchair (measurements PT);Wheelchair cushion (measurements PT)    Recommendations for Other Services       Precautions / Restrictions Precautions Precautions: Fall Restrictions Weight Bearing Restrictions: Yes RLE Weight Bearing: Touchdown weight bearing    Mobility  Bed Mobility               General bed mobility comments: deferred secondary to discharge  Transfers                    Ambulation/Gait                 Stairs             Wheelchair Mobility    Modified Rankin (Stroke Patients Only)       Balance                                            Cognition Arousal/Alertness: Awake/alert Behavior During Therapy: WFL for tasks assessed/performed Overall Cognitive Status: Within Functional Limits for tasks assessed                                 General Comments: Pt is A and O x 4.      Exercises Other Exercises Other Exercises: supine ther-ex on B LE including quad sets, SLRs, hip abd/add, hip add squeezes, and SAQs. 12 reps with supervision    General  Comments        Pertinent Vitals/Pain Pain Assessment: Faces Faces Pain Scale: Hurts little more Pain Location: RLE Pain Descriptors / Indicators: Sore Pain Intervention(s): Limited activity within patient's tolerance;Premedicated before session    Home Living                      Prior Function            PT Goals (current goals can now be found in the care plan section) Acute Rehab PT Goals Patient Stated Goal: To walk again and get back home PT Goal Formulation: With patient Time For Goal Achievement: 12/22/19 Potential to Achieve Goals: Fair Progress towards PT goals: Progressing toward goals    Frequency    BID      PT Plan Current plan remains appropriate    Co-evaluation              AM-PAC PT "6 Clicks" Mobility   Outcome Measure  Help needed turning from your back to your side while in  a flat bed without using bedrails?: A Little Help needed moving from lying on your back to sitting on the side of a flat bed without using bedrails?: A Little Help needed moving to and from a bed to a chair (including a wheelchair)?: Total Help needed standing up from a chair using your arms (e.g., wheelchair or bedside chair)?: A Lot Help needed to walk in hospital room?: Total Help needed climbing 3-5 steps with a railing? : Total 6 Click Score: 11    End of Session   Activity Tolerance: Patient tolerated treatment well Patient left: in bed;with call bell/phone within reach;with family/visitor present;with bed alarm set Nurse Communication: Mobility status;Precautions;Weight bearing status PT Visit Diagnosis: History of falling (Z91.81);Muscle weakness (generalized) (M62.81);Difficulty in walking, not elsewhere classified (R26.2);Pain Pain - Right/Left: Right Pain - part of body: Leg     Time: 1041-1051 PT Time Calculation (min) (ACUTE ONLY): 10 min  Charges:  $Therapeutic Exercise: 8-22 mins                     Greggory Stallion, PT,  DPT [phone]    Amber [name] 12/14/2019, 12:12 PM

## 2019-12-14 NOTE — TOC Progression Note (Signed)
Transition of Care [facility]) - Progression Note    Patient Details  Name: Amber [name] MRN: [id number] Date of Birth: [birth year]  [facility]) CM/SW Contact  Shelbie Hutching, RN Phone Number: 12/14/2019, 11:26 AM  Clinical Narrative:    [facility] transport has been set up.  Patient is going to room 805 at [facility].    Expected Discharge Plan: [place] Barriers to Discharge: Barriers Resolved  Expected Discharge Plan and Services Expected Discharge Plan: [place]   Discharge Planning Services: CM Consult Post Acute Care Choice: [place] Living arrangements for the past 2 months: Single Family Home Expected Discharge Date: 12/14/19                                     Social Determinants of Health (SDOH) Interventions    Readmission Risk Interventions No flowsheet data found.

## 2019-12-18 DIAGNOSIS — E119 Type 2 diabetes mellitus without complications: Secondary | ICD-10-CM | POA: Diagnosis not present

## 2019-12-18 DIAGNOSIS — I10 Essential (primary) hypertension: Secondary | ICD-10-CM | POA: Diagnosis not present

## 2019-12-18 DIAGNOSIS — S82151D Displaced fracture of right tibial tuberosity, subsequent encounter for closed fracture with routine healing: Secondary | ICD-10-CM | POA: Diagnosis not present

## 2019-12-18 DIAGNOSIS — K219 Gastro-esophageal reflux disease without esophagitis: Secondary | ICD-10-CM | POA: Diagnosis not present

## 2019-12-18 DIAGNOSIS — R569 Unspecified convulsions: Secondary | ICD-10-CM | POA: Diagnosis not present

## 2019-12-18 DIAGNOSIS — E785 Hyperlipidemia, unspecified: Secondary | ICD-10-CM | POA: Diagnosis not present

## 2019-12-27 DIAGNOSIS — K219 Gastro-esophageal reflux disease without esophagitis: Secondary | ICD-10-CM | POA: Diagnosis not present

## 2019-12-27 DIAGNOSIS — D649 Anemia, unspecified: Secondary | ICD-10-CM | POA: Diagnosis not present

## 2019-12-27 DIAGNOSIS — S82151D Displaced fracture of right tibial tuberosity, subsequent encounter for closed fracture with routine healing: Secondary | ICD-10-CM | POA: Diagnosis not present

## 2019-12-27 DIAGNOSIS — R569 Unspecified convulsions: Secondary | ICD-10-CM | POA: Diagnosis not present

## 2019-12-27 DIAGNOSIS — I10 Essential (primary) hypertension: Secondary | ICD-10-CM | POA: Diagnosis not present

## 2019-12-27 DIAGNOSIS — E785 Hyperlipidemia, unspecified: Secondary | ICD-10-CM | POA: Diagnosis not present

## 2019-12-27 DIAGNOSIS — I2510 Atherosclerotic heart disease of native coronary artery without angina pectoris: Secondary | ICD-10-CM | POA: Diagnosis not present

## 2019-12-27 DIAGNOSIS — I509 Heart failure, unspecified: Secondary | ICD-10-CM | POA: Diagnosis not present

## 2019-12-27 DIAGNOSIS — E119 Type 2 diabetes mellitus without complications: Secondary | ICD-10-CM | POA: Diagnosis not present

## 2020-01-02 DIAGNOSIS — S82151D Displaced fracture of right tibial tuberosity, subsequent encounter for closed fracture with routine healing: Secondary | ICD-10-CM | POA: Diagnosis not present

## 2020-01-02 DIAGNOSIS — M19012 Primary osteoarthritis, left shoulder: Secondary | ICD-10-CM | POA: Diagnosis not present

## 2020-01-02 DIAGNOSIS — M19011 Primary osteoarthritis, right shoulder: Secondary | ICD-10-CM | POA: Diagnosis not present

## 2020-01-02 DIAGNOSIS — M179 Osteoarthritis of knee, unspecified: Secondary | ICD-10-CM | POA: Diagnosis not present

## 2020-01-02 DIAGNOSIS — I110 Hypertensive heart disease with heart failure: Secondary | ICD-10-CM | POA: Diagnosis not present

## 2020-01-02 DIAGNOSIS — M47816 Spondylosis without myelopathy or radiculopathy, lumbar region: Secondary | ICD-10-CM | POA: Diagnosis not present

## 2020-01-02 DIAGNOSIS — M5136 Other intervertebral disc degeneration, lumbar region: Secondary | ICD-10-CM | POA: Diagnosis not present

## 2020-01-02 DIAGNOSIS — G8929 Other chronic pain: Secondary | ICD-10-CM | POA: Diagnosis not present

## 2020-01-02 DIAGNOSIS — M169 Osteoarthritis of hip, unspecified: Secondary | ICD-10-CM | POA: Diagnosis not present

## 2020-01-03 DIAGNOSIS — M19012 Primary osteoarthritis, left shoulder: Secondary | ICD-10-CM | POA: Diagnosis not present

## 2020-01-03 DIAGNOSIS — M179 Osteoarthritis of knee, unspecified: Secondary | ICD-10-CM | POA: Diagnosis not present

## 2020-01-03 DIAGNOSIS — M169 Osteoarthritis of hip, unspecified: Secondary | ICD-10-CM | POA: Diagnosis not present

## 2020-01-03 DIAGNOSIS — S82201D Unspecified fracture of shaft of right tibia, subsequent encounter for closed fracture with routine healing: Secondary | ICD-10-CM | POA: Diagnosis not present

## 2020-01-03 DIAGNOSIS — M5136 Other intervertebral disc degeneration, lumbar region: Secondary | ICD-10-CM | POA: Diagnosis not present

## 2020-01-03 DIAGNOSIS — W19XXXD Unspecified fall, subsequent encounter: Secondary | ICD-10-CM | POA: Diagnosis not present

## 2020-01-03 DIAGNOSIS — S82151D Displaced fracture of right tibial tuberosity, subsequent encounter for closed fracture with routine healing: Secondary | ICD-10-CM | POA: Diagnosis not present

## 2020-01-03 DIAGNOSIS — M19011 Primary osteoarthritis, right shoulder: Secondary | ICD-10-CM | POA: Diagnosis not present

## 2020-01-03 DIAGNOSIS — I110 Hypertensive heart disease with heart failure: Secondary | ICD-10-CM | POA: Diagnosis not present

## 2020-01-03 DIAGNOSIS — M47816 Spondylosis without myelopathy or radiculopathy, lumbar region: Secondary | ICD-10-CM | POA: Diagnosis not present

## 2020-01-03 DIAGNOSIS — G8929 Other chronic pain: Secondary | ICD-10-CM | POA: Diagnosis not present

## 2020-01-04 DIAGNOSIS — S82151D Displaced fracture of right tibial tuberosity, subsequent encounter for closed fracture with routine healing: Secondary | ICD-10-CM | POA: Diagnosis not present

## 2020-01-04 DIAGNOSIS — M47816 Spondylosis without myelopathy or radiculopathy, lumbar region: Secondary | ICD-10-CM | POA: Diagnosis not present

## 2020-01-04 DIAGNOSIS — M19011 Primary osteoarthritis, right shoulder: Secondary | ICD-10-CM | POA: Diagnosis not present

## 2020-01-04 DIAGNOSIS — G8929 Other chronic pain: Secondary | ICD-10-CM | POA: Diagnosis not present

## 2020-01-04 DIAGNOSIS — M5136 Other intervertebral disc degeneration, lumbar region: Secondary | ICD-10-CM | POA: Diagnosis not present

## 2020-01-04 DIAGNOSIS — M179 Osteoarthritis of knee, unspecified: Secondary | ICD-10-CM | POA: Diagnosis not present

## 2020-01-04 DIAGNOSIS — I110 Hypertensive heart disease with heart failure: Secondary | ICD-10-CM | POA: Diagnosis not present

## 2020-01-04 DIAGNOSIS — M169 Osteoarthritis of hip, unspecified: Secondary | ICD-10-CM | POA: Diagnosis not present

## 2020-01-04 DIAGNOSIS — M19012 Primary osteoarthritis, left shoulder: Secondary | ICD-10-CM | POA: Diagnosis not present

## 2020-01-06 DIAGNOSIS — I509 Heart failure, unspecified: Secondary | ICD-10-CM | POA: Diagnosis not present

## 2020-01-06 DIAGNOSIS — G894 Chronic pain syndrome: Secondary | ICD-10-CM | POA: Diagnosis not present

## 2020-01-06 DIAGNOSIS — I110 Hypertensive heart disease with heart failure: Secondary | ICD-10-CM | POA: Diagnosis not present

## 2020-01-06 DIAGNOSIS — Z9889 Other specified postprocedural states: Secondary | ICD-10-CM | POA: Diagnosis not present

## 2020-01-06 DIAGNOSIS — I517 Cardiomegaly: Secondary | ICD-10-CM | POA: Diagnosis not present

## 2020-01-06 DIAGNOSIS — I5022 Chronic systolic (congestive) heart failure: Secondary | ICD-10-CM | POA: Diagnosis not present

## 2020-01-06 DIAGNOSIS — Z8781 Personal history of (healed) traumatic fracture: Secondary | ICD-10-CM | POA: Diagnosis not present

## 2020-01-10 DIAGNOSIS — I110 Hypertensive heart disease with heart failure: Secondary | ICD-10-CM | POA: Diagnosis not present

## 2020-01-10 DIAGNOSIS — G8929 Other chronic pain: Secondary | ICD-10-CM | POA: Diagnosis not present

## 2020-01-10 DIAGNOSIS — M5136 Other intervertebral disc degeneration, lumbar region: Secondary | ICD-10-CM | POA: Diagnosis not present

## 2020-01-10 DIAGNOSIS — S82151D Displaced fracture of right tibial tuberosity, subsequent encounter for closed fracture with routine healing: Secondary | ICD-10-CM | POA: Diagnosis not present

## 2020-01-10 DIAGNOSIS — I25118 Atherosclerotic heart disease of native coronary artery with other forms of angina pectoris: Secondary | ICD-10-CM | POA: Diagnosis not present

## 2020-01-10 DIAGNOSIS — M169 Osteoarthritis of hip, unspecified: Secondary | ICD-10-CM | POA: Diagnosis not present

## 2020-01-10 DIAGNOSIS — M19012 Primary osteoarthritis, left shoulder: Secondary | ICD-10-CM | POA: Diagnosis not present

## 2020-01-10 DIAGNOSIS — M19011 Primary osteoarthritis, right shoulder: Secondary | ICD-10-CM | POA: Diagnosis not present

## 2020-01-10 DIAGNOSIS — M47816 Spondylosis without myelopathy or radiculopathy, lumbar region: Secondary | ICD-10-CM | POA: Diagnosis not present

## 2020-01-10 DIAGNOSIS — M179 Osteoarthritis of knee, unspecified: Secondary | ICD-10-CM | POA: Diagnosis not present

## 2020-01-13 DIAGNOSIS — G8929 Other chronic pain: Secondary | ICD-10-CM | POA: Diagnosis not present

## 2020-01-13 DIAGNOSIS — R55 Syncope and collapse: Secondary | ICD-10-CM | POA: Diagnosis not present

## 2020-01-13 DIAGNOSIS — I495 Sick sinus syndrome: Secondary | ICD-10-CM | POA: Diagnosis not present

## 2020-01-13 DIAGNOSIS — M19011 Primary osteoarthritis, right shoulder: Secondary | ICD-10-CM | POA: Diagnosis not present

## 2020-01-13 DIAGNOSIS — I5022 Chronic systolic (congestive) heart failure: Secondary | ICD-10-CM | POA: Diagnosis not present

## 2020-01-13 DIAGNOSIS — M19012 Primary osteoarthritis, left shoulder: Secondary | ICD-10-CM | POA: Diagnosis not present

## 2020-01-13 DIAGNOSIS — I25810 Atherosclerosis of coronary artery bypass graft(s) without angina pectoris: Secondary | ICD-10-CM | POA: Diagnosis not present

## 2020-01-13 DIAGNOSIS — M47816 Spondylosis without myelopathy or radiculopathy, lumbar region: Secondary | ICD-10-CM | POA: Diagnosis not present

## 2020-01-13 DIAGNOSIS — M5136 Other intervertebral disc degeneration, lumbar region: Secondary | ICD-10-CM | POA: Diagnosis not present

## 2020-01-13 DIAGNOSIS — S82151D Displaced fracture of right tibial tuberosity, subsequent encounter for closed fracture with routine healing: Secondary | ICD-10-CM | POA: Diagnosis not present

## 2020-01-13 DIAGNOSIS — M169 Osteoarthritis of hip, unspecified: Secondary | ICD-10-CM | POA: Diagnosis not present

## 2020-01-13 DIAGNOSIS — Z01818 Encounter for other preprocedural examination: Secondary | ICD-10-CM | POA: Diagnosis not present

## 2020-01-16 DIAGNOSIS — M47816 Spondylosis without myelopathy or radiculopathy, lumbar region: Secondary | ICD-10-CM | POA: Diagnosis not present

## 2020-01-16 DIAGNOSIS — G8929 Other chronic pain: Secondary | ICD-10-CM | POA: Diagnosis not present

## 2020-01-16 DIAGNOSIS — M169 Osteoarthritis of hip, unspecified: Secondary | ICD-10-CM | POA: Diagnosis not present

## 2020-01-16 DIAGNOSIS — M19012 Primary osteoarthritis, left shoulder: Secondary | ICD-10-CM | POA: Diagnosis not present

## 2020-01-16 DIAGNOSIS — S82151D Displaced fracture of right tibial tuberosity, subsequent encounter for closed fracture with routine healing: Secondary | ICD-10-CM | POA: Diagnosis not present

## 2020-01-16 DIAGNOSIS — M5136 Other intervertebral disc degeneration, lumbar region: Secondary | ICD-10-CM | POA: Diagnosis not present

## 2020-01-16 DIAGNOSIS — M179 Osteoarthritis of knee, unspecified: Secondary | ICD-10-CM | POA: Diagnosis not present

## 2020-01-16 DIAGNOSIS — I110 Hypertensive heart disease with heart failure: Secondary | ICD-10-CM | POA: Diagnosis not present

## 2020-01-16 DIAGNOSIS — M19011 Primary osteoarthritis, right shoulder: Secondary | ICD-10-CM | POA: Diagnosis not present

## 2020-01-17 DIAGNOSIS — M47816 Spondylosis without myelopathy or radiculopathy, lumbar region: Secondary | ICD-10-CM | POA: Diagnosis not present

## 2020-01-17 DIAGNOSIS — M5136 Other intervertebral disc degeneration, lumbar region: Secondary | ICD-10-CM | POA: Diagnosis not present

## 2020-01-17 DIAGNOSIS — M179 Osteoarthritis of knee, unspecified: Secondary | ICD-10-CM | POA: Diagnosis not present

## 2020-01-17 DIAGNOSIS — M19011 Primary osteoarthritis, right shoulder: Secondary | ICD-10-CM | POA: Diagnosis not present

## 2020-01-17 DIAGNOSIS — M169 Osteoarthritis of hip, unspecified: Secondary | ICD-10-CM | POA: Diagnosis not present

## 2020-01-17 DIAGNOSIS — S82151D Displaced fracture of right tibial tuberosity, subsequent encounter for closed fracture with routine healing: Secondary | ICD-10-CM | POA: Diagnosis not present

## 2020-01-17 DIAGNOSIS — G8929 Other chronic pain: Secondary | ICD-10-CM | POA: Diagnosis not present

## 2020-01-17 DIAGNOSIS — I110 Hypertensive heart disease with heart failure: Secondary | ICD-10-CM | POA: Diagnosis not present

## 2020-01-17 DIAGNOSIS — M19012 Primary osteoarthritis, left shoulder: Secondary | ICD-10-CM | POA: Diagnosis not present

## 2020-01-19 DIAGNOSIS — M19011 Primary osteoarthritis, right shoulder: Secondary | ICD-10-CM | POA: Diagnosis not present

## 2020-01-19 DIAGNOSIS — G8929 Other chronic pain: Secondary | ICD-10-CM | POA: Diagnosis not present

## 2020-01-19 DIAGNOSIS — M47816 Spondylosis without myelopathy or radiculopathy, lumbar region: Secondary | ICD-10-CM | POA: Diagnosis not present

## 2020-01-19 DIAGNOSIS — S82151D Displaced fracture of right tibial tuberosity, subsequent encounter for closed fracture with routine healing: Secondary | ICD-10-CM | POA: Diagnosis not present

## 2020-01-20 ENCOUNTER — Other Ambulatory Visit: Payer: Self-pay

## 2020-01-20 ENCOUNTER — Other Ambulatory Visit
Admission: RE | Admit: 2020-01-20 | Discharge: 2020-01-20 | Disposition: A | Discharge status: Home / Self Care | Payer: Medicare HMO | Source: Ambulatory Visit | Attending: Cardiology | Admitting: Cardiology

## 2020-01-20 DIAGNOSIS — G8929 Other chronic pain: Secondary | ICD-10-CM | POA: Diagnosis not present

## 2020-01-20 DIAGNOSIS — M169 Osteoarthritis of hip, unspecified: Secondary | ICD-10-CM | POA: Diagnosis not present

## 2020-01-20 DIAGNOSIS — M5136 Other intervertebral disc degeneration, lumbar region: Secondary | ICD-10-CM | POA: Diagnosis not present

## 2020-01-20 DIAGNOSIS — M19012 Primary osteoarthritis, left shoulder: Secondary | ICD-10-CM | POA: Diagnosis not present

## 2020-01-20 DIAGNOSIS — S82151D Displaced fracture of right tibial tuberosity, subsequent encounter for closed fracture with routine healing: Secondary | ICD-10-CM | POA: Diagnosis not present

## 2020-01-20 DIAGNOSIS — M19011 Primary osteoarthritis, right shoulder: Secondary | ICD-10-CM | POA: Diagnosis not present

## 2020-01-20 DIAGNOSIS — Z20822 Contact with and (suspected) exposure to covid-19: Secondary | ICD-10-CM | POA: Diagnosis not present

## 2020-01-20 DIAGNOSIS — M47816 Spondylosis without myelopathy or radiculopathy, lumbar region: Secondary | ICD-10-CM | POA: Diagnosis not present

## 2020-01-20 DIAGNOSIS — Z01812 Encounter for preprocedural laboratory examination: Secondary | ICD-10-CM | POA: Insufficient documentation

## 2020-01-20 DIAGNOSIS — M179 Osteoarthritis of knee, unspecified: Secondary | ICD-10-CM | POA: Diagnosis not present

## 2020-01-20 DIAGNOSIS — I110 Hypertensive heart disease with heart failure: Secondary | ICD-10-CM | POA: Diagnosis not present

## 2020-01-20 LAB — SARS CORONAVIRUS 2 (TAT 6-24 HRS): SARS Coronavirus 2: NEGATIVE

## 2020-01-23 DIAGNOSIS — M19012 Primary osteoarthritis, left shoulder: Secondary | ICD-10-CM | POA: Diagnosis not present

## 2020-01-23 DIAGNOSIS — M5136 Other intervertebral disc degeneration, lumbar region: Secondary | ICD-10-CM | POA: Diagnosis not present

## 2020-01-23 DIAGNOSIS — M179 Osteoarthritis of knee, unspecified: Secondary | ICD-10-CM | POA: Diagnosis not present

## 2020-01-23 DIAGNOSIS — I110 Hypertensive heart disease with heart failure: Secondary | ICD-10-CM | POA: Diagnosis not present

## 2020-01-23 DIAGNOSIS — M169 Osteoarthritis of hip, unspecified: Secondary | ICD-10-CM | POA: Diagnosis not present

## 2020-01-23 DIAGNOSIS — M47816 Spondylosis without myelopathy or radiculopathy, lumbar region: Secondary | ICD-10-CM | POA: Diagnosis not present

## 2020-01-23 DIAGNOSIS — S82151D Displaced fracture of right tibial tuberosity, subsequent encounter for closed fracture with routine healing: Secondary | ICD-10-CM | POA: Diagnosis not present

## 2020-01-23 DIAGNOSIS — M19011 Primary osteoarthritis, right shoulder: Secondary | ICD-10-CM | POA: Diagnosis not present

## 2020-01-23 DIAGNOSIS — G8929 Other chronic pain: Secondary | ICD-10-CM | POA: Diagnosis not present

## 2020-01-24 ENCOUNTER — Other Ambulatory Visit: Payer: Self-pay

## 2020-01-24 ENCOUNTER — Encounter
Admission: RE | Disposition: A | Discharge status: Home / Self Care | Payer: Self-pay | Source: Home / Self Care | Attending: Cardiology

## 2020-01-24 ENCOUNTER — Observation Stay
Admission: RE | Admit: 2020-01-24 | Discharge: 2020-01-25 | Disposition: A | Discharge status: Home / Self Care | Payer: Medicare HMO | Attending: Internal Medicine | Admitting: Internal Medicine

## 2020-01-24 ENCOUNTER — Encounter: Payer: Self-pay | Admitting: Cardiology

## 2020-01-24 DIAGNOSIS — I110 Hypertensive heart disease with heart failure: Secondary | ICD-10-CM | POA: Insufficient documentation

## 2020-01-24 DIAGNOSIS — Z96651 Presence of right artificial knee joint: Secondary | ICD-10-CM | POA: Insufficient documentation

## 2020-01-24 DIAGNOSIS — R55 Syncope and collapse: Secondary | ICD-10-CM | POA: Diagnosis present

## 2020-01-24 DIAGNOSIS — T82110A Breakdown (mechanical) of cardiac electrode, initial encounter: Secondary | ICD-10-CM

## 2020-01-24 DIAGNOSIS — I442 Atrioventricular block, complete: Secondary | ICD-10-CM | POA: Diagnosis present

## 2020-01-24 DIAGNOSIS — I495 Sick sinus syndrome: Secondary | ICD-10-CM | POA: Diagnosis not present

## 2020-01-24 DIAGNOSIS — E119 Type 2 diabetes mellitus without complications: Secondary | ICD-10-CM | POA: Diagnosis not present

## 2020-01-24 DIAGNOSIS — I5022 Chronic systolic (congestive) heart failure: Secondary | ICD-10-CM | POA: Insufficient documentation

## 2020-01-24 DIAGNOSIS — R0602 Shortness of breath: Secondary | ICD-10-CM | POA: Insufficient documentation

## 2020-01-24 DIAGNOSIS — Z7901 Long term (current) use of anticoagulants: Secondary | ICD-10-CM | POA: Diagnosis not present

## 2020-01-24 DIAGNOSIS — I2510 Atherosclerotic heart disease of native coronary artery without angina pectoris: Secondary | ICD-10-CM | POA: Diagnosis not present

## 2020-01-24 DIAGNOSIS — Z951 Presence of aortocoronary bypass graft: Secondary | ICD-10-CM | POA: Insufficient documentation

## 2020-01-24 DIAGNOSIS — Z006 Encounter for examination for normal comparison and control in clinical research program: Secondary | ICD-10-CM | POA: Diagnosis not present

## 2020-01-24 DIAGNOSIS — Z96641 Presence of right artificial hip joint: Secondary | ICD-10-CM | POA: Insufficient documentation

## 2020-01-24 DIAGNOSIS — Z794 Long term (current) use of insulin: Secondary | ICD-10-CM | POA: Diagnosis not present

## 2020-01-24 HISTORY — PX: PACEMAKER LEADLESS INSERTION: EP1219

## 2020-01-24 HISTORY — DX: Gastro-esophageal reflux disease without esophagitis: K21.9

## 2020-01-24 LAB — GLUCOSE, CAPILLARY
Glucose-Capillary: 126 mg/dL — ABNORMAL HIGH (ref 70–99)
Glucose-Capillary: 112 mg/dL — ABNORMAL HIGH (ref 70–99)
Glucose-Capillary: 128 mg/dL — ABNORMAL HIGH (ref 70–99)
Glucose-Capillary: 111 mg/dL — ABNORMAL HIGH (ref 70–99)

## 2020-01-24 SURGERY — PACEMAKER LEADLESS INSERTION
Anesthesia: Moderate Sedation

## 2020-01-24 MED ORDER — INSULIN ASPART 100 UNIT/ML ~~LOC~~ SOLN: 0.0000 [IU] | Freq: Three times a day (TID) | SUBCUTANEOUS | Status: DC

## 2020-01-24 MED ORDER — HEPARIN SODIUM (PORCINE) 1000 UNIT/ML IJ SOLN
INTRAMUSCULAR | Status: DC | PRN
  Administered 2020-01-24: 3500 [IU] via INTRAVENOUS

## 2020-01-24 MED ORDER — SODIUM CHLORIDE 0.9% FLUSH
3.0000 mL | Freq: Two times a day (BID) | INTRAVENOUS | Status: DC
  Administered 2020-01-24 (×2): 3 mL via INTRAVENOUS

## 2020-01-24 MED ORDER — IOHEXOL 300 MG/ML  SOLN
INTRAMUSCULAR | Status: DC | PRN
  Administered 2020-01-24: 25 mL

## 2020-01-24 MED ORDER — FENTANYL CITRATE (PF) 100 MCG/2ML IJ SOLN
INTRAMUSCULAR | Status: DC | PRN
  Administered 2020-01-24: 25 ug via INTRAVENOUS

## 2020-01-24 MED ORDER — PNEUMOCOCCAL VAC POLYVALENT 25 MCG/0.5ML IJ INJ: 0.5000 mL | INJECTION | INTRAMUSCULAR | Status: DC

## 2020-01-24 MED ORDER — FERROUS SULFATE 325 (65 FE) MG PO TABS
325.0000 mg | ORAL_TABLET | Freq: Every day | ORAL | Status: DC
  Administered 2020-01-25: 325 mg via ORAL
  Filled 2020-01-24: qty 1

## 2020-01-24 MED ORDER — HEPARIN (PORCINE) IN NACL 1000-0.9 UT/500ML-% IV SOLN
INTRAVENOUS | Status: DC | PRN
  Administered 2020-01-24: 500 mL

## 2020-01-24 MED ORDER — SODIUM CHLORIDE 0.9 % IV SOLN: INTRAVENOUS | Status: DC

## 2020-01-24 MED ORDER — AMLODIPINE BESYLATE 10 MG PO TABS
10.0000 mg | ORAL_TABLET | Freq: Every day | ORAL | Status: DC
  Administered 2020-01-24 – 2020-01-25 (×2): 10 mg via ORAL
  Filled 2020-01-24 (×2): qty 1

## 2020-01-24 MED ORDER — INSULIN GLARGINE 100 UNIT/ML ~~LOC~~ SOLN: 15.0000 [IU] | Freq: Every day | SUBCUTANEOUS | Status: DC

## 2020-01-24 MED ORDER — CARVEDILOL 6.25 MG PO TABS
6.2500 mg | ORAL_TABLET | Freq: Two times a day (BID) | ORAL | Status: DC
  Administered 2020-01-24 – 2020-01-25 (×2): 6.25 mg via ORAL
  Filled 2020-01-24 (×2): qty 1

## 2020-01-24 MED ORDER — ACETAMINOPHEN 325 MG PO TABS
650.0000 mg | ORAL_TABLET | ORAL | Status: DC | PRN
  Administered 2020-01-24 – 2020-01-25 (×2): 650 mg via ORAL
  Filled 2020-01-24 (×2): qty 2

## 2020-01-24 MED ORDER — MIDAZOLAM HCL 2 MG/2ML IJ SOLN
INTRAMUSCULAR | Status: DC | PRN
  Administered 2020-01-24: 0.5 mg via INTRAVENOUS

## 2020-01-24 MED ORDER — GABAPENTIN 100 MG PO CAPS
100.0000 mg | ORAL_CAPSULE | Freq: Three times a day (TID) | ORAL | Status: DC
  Administered 2020-01-24 – 2020-01-25 (×3): 100 mg via ORAL
  Filled 2020-01-24 (×3): qty 1

## 2020-01-24 MED ORDER — CALCIUM CARBONATE ANTACID 500 MG PO CHEW
1.0000 | CHEWABLE_TABLET | Freq: Two times a day (BID) | ORAL | Status: DC
  Administered 2020-01-24 – 2020-01-25 (×2): 200 mg via ORAL
  Filled 2020-01-24 (×2): qty 1

## 2020-01-24 MED ORDER — ONDANSETRON HCL 4 MG/2ML IJ SOLN: 4.0000 mg | Freq: Four times a day (QID) | INTRAMUSCULAR | Status: DC | PRN

## 2020-01-24 MED ORDER — MIDAZOLAM HCL 2 MG/2ML IJ SOLN
INTRAMUSCULAR | Status: AC
  Filled 2020-01-24: qty 2

## 2020-01-24 MED ORDER — ALPRAZOLAM 0.5 MG PO TABS
1.0000 mg | ORAL_TABLET | Freq: Two times a day (BID) | ORAL | Status: DC | PRN
  Administered 2020-01-25: 1 mg via ORAL
  Filled 2020-01-24: qty 2

## 2020-01-24 MED ORDER — SACUBITRIL-VALSARTAN 24-26 MG PO TABS: 1.0000 | ORAL_TABLET | Freq: Two times a day (BID) | ORAL | Status: DC

## 2020-01-24 MED ORDER — HEPARIN SODIUM (PORCINE) 1000 UNIT/ML IJ SOLN
INTRAMUSCULAR | Status: AC
  Filled 2020-01-24: qty 1

## 2020-01-24 MED ORDER — FENTANYL CITRATE (PF) 100 MCG/2ML IJ SOLN
INTRAMUSCULAR | Status: AC
  Filled 2020-01-24: qty 2

## 2020-01-24 MED ORDER — SODIUM CHLORIDE 0.45 % IV SOLN: INTRAVENOUS | Status: DC

## 2020-01-24 MED ORDER — VANCOMYCIN HCL IN DEXTROSE 1-5 GM/200ML-% IV SOLN
1000.0000 mg | INTRAVENOUS | Status: DC
  Filled 2020-01-24: qty 200

## 2020-01-24 MED ORDER — PRAVASTATIN SODIUM 40 MG PO TABS
40.0000 mg | ORAL_TABLET | Freq: Every day | ORAL | Status: DC
  Administered 2020-01-24: 40 mg via ORAL
  Filled 2020-01-24: qty 1

## 2020-01-24 MED ORDER — SODIUM CHLORIDE 0.9% FLUSH: 3.0000 mL | INTRAVENOUS | Status: DC | PRN

## 2020-01-24 MED ORDER — LEVETIRACETAM 500 MG PO TABS
500.0000 mg | ORAL_TABLET | Freq: Two times a day (BID) | ORAL | Status: DC
  Administered 2020-01-24 – 2020-01-25 (×2): 500 mg via ORAL
  Filled 2020-01-24 (×2): qty 1

## 2020-01-24 MED ORDER — ALBUTEROL SULFATE (2.5 MG/3ML) 0.083% IN NEBU: 3.0000 mL | INHALATION_SOLUTION | Freq: Four times a day (QID) | RESPIRATORY_TRACT | Status: DC | PRN

## 2020-01-24 MED ORDER — SODIUM CHLORIDE 0.9 % IV SOLN: 250.0000 mL | INTRAVENOUS | Status: DC | PRN

## 2020-01-24 SURGICAL SUPPLY — 13 items
DILATOR VESSEL 38 20CM 12FR (INTRODUCER) ×3 IMPLANT
DILATOR VESSEL 38 20CM 14FR (INTRODUCER) ×3 IMPLANT
DILATOR VESSEL 38 20CM 18FR (INTRODUCER) ×3 IMPLANT
DILATOR VESSEL 38 20CM 8FR (INTRODUCER) ×3 IMPLANT
GUIDEWIRE SUPER STIFF .035X180 (WIRE) ×3 IMPLANT
MICRA AV TRANSCATH PACING SYS (Pacemaker) ×3 IMPLANT
MICRA INTRODUCER SHEATH (SHEATH) ×3
NEEDLE PERC 18GX7CM (NEEDLE) ×3 IMPLANT
PACK CARDIAC CATH (CUSTOM PROCEDURE TRAY) ×3 IMPLANT
SHEATH AVANTI 7FRX11 (SHEATH) ×3 IMPLANT
SHEATH INTRODUCER MICRA (SHEATH) ×1 IMPLANT
SYSTEM PACING TRNSCTH AV MICRA (Pacemaker) ×1 IMPLANT
WIRE GUIDERIGHT .035X150 (WIRE) ×3 IMPLANT

## 2020-01-24 NOTE — Progress Notes (Addendum)
Patient Cath site started bleeding -   Applied pressure for 2 minutes.  Charge nurse aware.   Change bandage-  Site was not bleeding at time.   Will continue to monitor  Paged MD    1600-  Patient site started bleeding more.  Paged MD and cath lab- asked them come assess.   Pressure was applied for 15-20 minutes.  RN from cath lab assessed and changed dressing.  Bleeding had stopped, dressing reapplied.  Will continue to monitor  1715-  Site started bleeding again.  Pressure was applied and bandaged changed.   MD notified.    Per MD- apply pressure for 10-15 minutes and change dressing.    1900-  Small amount of bleeding noted at edges of gauze-  Marked for night shift to monitor.  Reported MD instruction to night shift nurse and site was assessed during hand off

## 2020-01-25 DIAGNOSIS — I495 Sick sinus syndrome: Secondary | ICD-10-CM | POA: Diagnosis not present

## 2020-01-25 DIAGNOSIS — Z9861 Coronary angioplasty status: Secondary | ICD-10-CM | POA: Diagnosis not present

## 2020-01-25 DIAGNOSIS — Z006 Encounter for examination for normal comparison and control in clinical research program: Secondary | ICD-10-CM | POA: Diagnosis not present

## 2020-01-25 DIAGNOSIS — I442 Atrioventricular block, complete: Secondary | ICD-10-CM | POA: Diagnosis not present

## 2020-01-25 LAB — BASIC METABOLIC PANEL
Anion gap: 7 (ref 5–15)
BUN: 22 mg/dL (ref 8–23)
CO2: 28 mmol/L (ref 22–32)
Calcium: 9.6 mg/dL (ref 8.9–10.3)
Chloride: 103 mmol/L (ref 98–111)
Creatinine, Ser: 0.7 mg/dL (ref 0.44–1.00)
GFR calc Af Amer: >60 mL/min (ref >60)
GFR calc non Af Amer: >60 mL/min (ref >60)
Glucose, Bld: 96 mg/dL (ref 70–99)
Potassium: 3.8 mmol/L (ref 3.5–5.1)
Sodium: 138 mmol/L (ref 135–145)

## 2020-01-25 LAB — CBC
HCT: 32.3 % — ABNORMAL LOW (ref 36.0–46.0)
Hemoglobin: 10.5 g/dL — ABNORMAL LOW (ref 12.0–15.0)
MCH: 27.9 pg (ref 26.0–34.0)
MCHC: 32.5 g/dL (ref 30.0–36.0)
MCV: 85.9 fL (ref 80.0–100.0)
Platelets: 267 10*3/uL (ref 150–400)
RBC: 3.76 MIL/uL — ABNORMAL LOW (ref 3.87–5.11)
RDW: 15.8 % — ABNORMAL HIGH (ref 11.5–15.5)
WBC: 6.2 10*3/uL (ref 4.0–10.5)
nRBC: 0 % (ref 0.0–0.2)

## 2020-01-25 LAB — GLUCOSE, CAPILLARY: Glucose-Capillary: 94 mg/dL (ref 70–99)

## 2020-01-25 NOTE — Progress Notes (Signed)
paitent alert and oriented, vss, no complaints of pain.  Escorted out of hospital via wheelchair by nursing staff.

## 2020-01-25 NOTE — Discharge Instructions (Signed)
You may resume Eliquis tomorrow (7/22). If you have active bleeding at the insertion site, please lie down and hold firm pressure for 20 minutes. If the bleeding persists, please call our office during normal office hours [phone]).  Resume your normal, low impact activities at home, such as walking inside your house with your walker. You may also take a shower tomorrow. You do not have to keep a bandage on the site unless you prefer it for comfort.

## 2020-01-25 NOTE — Discharge Summary (Signed)
Physician Discharge Summary      Patient ID: Laqueshia Cihlar MRN: [id number] DOB/AGE: 07-Sep-1929 86 y.o.  Admit date: 01/24/2020 Discharge date: 01/25/2020  Primary Discharge Diagnosis sick sinus syndrome, atrial fibrillation, lead fracture Secondary Discharge Diagnosis same  Significant Diagnostic Studies: none  Consults: None  Hospital Course: 86 year old female with a history of sick sinus syndrome, atrial fibrillation, status post single chamber pacemaker implantation in 2016 with subsequent recent lead fracture, who elected for leadless pacemaker implantation, which was performed on 01/24/2020 without apparent perioperative complications. The patient did have mild postoperative bleeding at the femoral access site, which was controlled with pressure. The patient has had no further apparent perioperative complications. Labs this morning reveal stable anemia and renal function. Telemetry today reveals paced rhythm at a rate of 60 bpm.    Discharge Exam: Blood pressure (!) 168/65, pulse (!) 59, temperature 98.3 F (36.8 C), temperature source Oral, resp. rate 19, height 4\' 11"  (1.499 m), weight 80.1 kg, SpO2 94 %.   General appearance: alert, cooperative, appears stated age and no distress Head: Normocephalic, without obvious abnormality, atraumatic Eyes: negative Cardio: regular rate and rhythm, 2/6 systolic murmur Extremities: extremities normal, atraumatic, no cyanosis or edema Skin: warm, dry, no diaphoresis Neurologic: Alert and oriented X 3, normal strength and tone. Normal symmetric reflexes. Normal coordination and gait Incision/Wound:right femoral vein access site- suture removed and intact without active bleeding with mild ecchymosis without hematoma. Mild tenderness to palpation. Gauze applied. Labs:   Lab Results  Component Value Date   WBC 6.2 01/25/2020   HGB 10.5 (L) 01/25/2020   HCT 32.3 (L) 01/25/2020   MCV 85.9 01/25/2020   PLT 267 01/25/2020     Recent Labs  Lab 01/25/20 0551  NA 138  K 3.8  CL 103  CO2 28  BUN 22  CREATININE 0.70  CALCIUM 9.6  GLUCOSE 96      EKG: paced rhythm, rate 60 bpm  FOLLOW UP PLANS AND APPOINTMENTS  Allergies as of 01/25/2020      Reactions   Ambien [zolpidem Tartrate] Other (See Comments)   Reaction:  Keeps pt awake    Penicillins Itching, Other (See Comments)   Has patient had a PCN reaction causing immediate rash, facial/tongue/throat swelling, SOB or lightheadedness with hypotension: No Has patient had a PCN reaction causing severe rash involving mucus membranes or skin necrosis: No Has patient had a PCN reaction that required hospitalization No Has patient had a PCN reaction occurring within the last 10 years: No If all of the above answers are "NO", then may proceed with Cephalosporin use.   Iodine Itching   Succinylcholine Other (See Comments)   Reaction:  Unknown    Etodolac Other (See Comments)   GI upset   Nsaids Other (See Comments)   Reaction: gi upset      Medication List    STOP taking these medications   HYDROcodone-acetaminophen 7.5-325 MG tablet Commonly known as: NORCO   sacubitril-valsartan 24-26 MG Commonly known as: ENTRESTO     TAKE these medications   acetaminophen 500 MG tablet Commonly known as: TYLENOL Take 1,000 mg by mouth every 6 (six) hours as needed for mild pain or headache.   albuterol 108 (90 Base) MCG/ACT inhaler Commonly known as: VENTOLIN HFA Inhale 2 puffs into the lungs every 6 (six) hours as needed for wheezing or shortness of breath.   ALPRAZolam 1 MG tablet Commonly known as: XANAX Take 1 tablet (1 mg total) by mouth 2 (  two) times daily as needed for anxiety or sleep.   amLODipine 10 MG tablet Commonly known as: NORVASC Take 5 mg by mouth daily.   calcium-vitamin D 500-200 MG-UNIT tablet Commonly known as: OSCAL WITH D Take 1 tablet by mouth daily.   carvedilol 6.25 MG tablet Commonly known as: COREG Take 1 tablet (6.25  mg total) by mouth 2 (two) times daily with a meal.   Eliquis 5 MG Tabs tablet Generic drug: apixaban Take 5 mg by mouth 2 (two) times daily.   ferrous sulfate 325 (65 FE) MG tablet Take 1 tablet (325 mg total) by mouth 2 (two) times daily with a meal.   gabapentin 100 MG capsule Commonly known as: NEURONTIN Take 200 mg by mouth 3 (three) times daily.   insulin glargine 100 UNIT/ML injection Commonly known as: LANTUS Inject 22 Units into the skin daily.   levETIRAcetam 500 MG tablet Commonly known as: KEPPRA Take 500 mg by mouth 2 (two) times daily.   omeprazole 40 MG capsule Commonly known as: PRILOSEC Take 40 mg by mouth daily.   pravastatin 40 MG tablet Commonly known as: PRAVACHOL Take 40 mg by mouth at bedtime.   PreserVision AREDS 2 Caps Take 1 capsule by mouth daily.       Follow-up Information    Corey Skains, MD. Go on 02/01/2020.   Specialty: Cardiology Contact information: [facility] [place] [address] [phone]               BRING ALL MEDICATIONS WITH YOU TO FOLLOW UP APPOINTMENTS  Time spent with patient to include physician time: 30 minutes Signed:  Clabe Seal PA-C 01/25/2020, 7:58 AM

## 2020-01-27 DIAGNOSIS — M19011 Primary osteoarthritis, right shoulder: Secondary | ICD-10-CM | POA: Diagnosis not present

## 2020-01-27 DIAGNOSIS — M179 Osteoarthritis of knee, unspecified: Secondary | ICD-10-CM | POA: Diagnosis not present

## 2020-01-27 DIAGNOSIS — M5136 Other intervertebral disc degeneration, lumbar region: Secondary | ICD-10-CM | POA: Diagnosis not present

## 2020-01-27 DIAGNOSIS — M169 Osteoarthritis of hip, unspecified: Secondary | ICD-10-CM | POA: Diagnosis not present

## 2020-01-27 DIAGNOSIS — G8929 Other chronic pain: Secondary | ICD-10-CM | POA: Diagnosis not present

## 2020-01-27 DIAGNOSIS — I110 Hypertensive heart disease with heart failure: Secondary | ICD-10-CM | POA: Diagnosis not present

## 2020-01-27 DIAGNOSIS — M47816 Spondylosis without myelopathy or radiculopathy, lumbar region: Secondary | ICD-10-CM | POA: Diagnosis not present

## 2020-01-27 DIAGNOSIS — M19012 Primary osteoarthritis, left shoulder: Secondary | ICD-10-CM | POA: Diagnosis not present

## 2020-01-27 DIAGNOSIS — S82151D Displaced fracture of right tibial tuberosity, subsequent encounter for closed fracture with routine healing: Secondary | ICD-10-CM | POA: Diagnosis not present

## 2020-01-30 DIAGNOSIS — M19012 Primary osteoarthritis, left shoulder: Secondary | ICD-10-CM | POA: Diagnosis not present

## 2020-01-30 DIAGNOSIS — M5136 Other intervertebral disc degeneration, lumbar region: Secondary | ICD-10-CM | POA: Diagnosis not present

## 2020-01-30 DIAGNOSIS — M179 Osteoarthritis of knee, unspecified: Secondary | ICD-10-CM | POA: Diagnosis not present

## 2020-01-30 DIAGNOSIS — M169 Osteoarthritis of hip, unspecified: Secondary | ICD-10-CM | POA: Diagnosis not present

## 2020-01-30 DIAGNOSIS — I110 Hypertensive heart disease with heart failure: Secondary | ICD-10-CM | POA: Diagnosis not present

## 2020-01-30 DIAGNOSIS — M19011 Primary osteoarthritis, right shoulder: Secondary | ICD-10-CM | POA: Diagnosis not present

## 2020-01-30 DIAGNOSIS — M47816 Spondylosis without myelopathy or radiculopathy, lumbar region: Secondary | ICD-10-CM | POA: Diagnosis not present

## 2020-01-30 DIAGNOSIS — G8929 Other chronic pain: Secondary | ICD-10-CM | POA: Diagnosis not present

## 2020-01-30 DIAGNOSIS — S82151D Displaced fracture of right tibial tuberosity, subsequent encounter for closed fracture with routine healing: Secondary | ICD-10-CM | POA: Diagnosis not present

## 2020-01-31 DIAGNOSIS — M179 Osteoarthritis of knee, unspecified: Secondary | ICD-10-CM | POA: Diagnosis not present

## 2020-01-31 DIAGNOSIS — M19011 Primary osteoarthritis, right shoulder: Secondary | ICD-10-CM | POA: Diagnosis not present

## 2020-01-31 DIAGNOSIS — S82151D Displaced fracture of right tibial tuberosity, subsequent encounter for closed fracture with routine healing: Secondary | ICD-10-CM | POA: Diagnosis not present

## 2020-01-31 DIAGNOSIS — I110 Hypertensive heart disease with heart failure: Secondary | ICD-10-CM | POA: Diagnosis not present

## 2020-01-31 DIAGNOSIS — M47816 Spondylosis without myelopathy or radiculopathy, lumbar region: Secondary | ICD-10-CM | POA: Diagnosis not present

## 2020-01-31 DIAGNOSIS — M5136 Other intervertebral disc degeneration, lumbar region: Secondary | ICD-10-CM | POA: Diagnosis not present

## 2020-01-31 DIAGNOSIS — G8929 Other chronic pain: Secondary | ICD-10-CM | POA: Diagnosis not present

## 2020-01-31 DIAGNOSIS — M19012 Primary osteoarthritis, left shoulder: Secondary | ICD-10-CM | POA: Diagnosis not present

## 2020-01-31 DIAGNOSIS — M169 Osteoarthritis of hip, unspecified: Secondary | ICD-10-CM | POA: Diagnosis not present

## 2020-02-02 DIAGNOSIS — M5136 Other intervertebral disc degeneration, lumbar region: Secondary | ICD-10-CM | POA: Diagnosis not present

## 2020-02-02 DIAGNOSIS — M19011 Primary osteoarthritis, right shoulder: Secondary | ICD-10-CM | POA: Diagnosis not present

## 2020-02-02 DIAGNOSIS — M179 Osteoarthritis of knee, unspecified: Secondary | ICD-10-CM | POA: Diagnosis not present

## 2020-02-02 DIAGNOSIS — G8929 Other chronic pain: Secondary | ICD-10-CM | POA: Diagnosis not present

## 2020-02-02 DIAGNOSIS — M47816 Spondylosis without myelopathy or radiculopathy, lumbar region: Secondary | ICD-10-CM | POA: Diagnosis not present

## 2020-02-02 DIAGNOSIS — M169 Osteoarthritis of hip, unspecified: Secondary | ICD-10-CM | POA: Diagnosis not present

## 2020-02-02 DIAGNOSIS — M19012 Primary osteoarthritis, left shoulder: Secondary | ICD-10-CM | POA: Diagnosis not present

## 2020-02-02 DIAGNOSIS — S82151D Displaced fracture of right tibial tuberosity, subsequent encounter for closed fracture with routine healing: Secondary | ICD-10-CM | POA: Diagnosis not present

## 2020-02-02 DIAGNOSIS — I110 Hypertensive heart disease with heart failure: Secondary | ICD-10-CM | POA: Diagnosis not present

## 2020-02-06 DIAGNOSIS — M19011 Primary osteoarthritis, right shoulder: Secondary | ICD-10-CM | POA: Diagnosis not present

## 2020-02-06 DIAGNOSIS — M179 Osteoarthritis of knee, unspecified: Secondary | ICD-10-CM | POA: Diagnosis not present

## 2020-02-06 DIAGNOSIS — I110 Hypertensive heart disease with heart failure: Secondary | ICD-10-CM | POA: Diagnosis not present

## 2020-02-06 DIAGNOSIS — M47816 Spondylosis without myelopathy or radiculopathy, lumbar region: Secondary | ICD-10-CM | POA: Diagnosis not present

## 2020-02-06 DIAGNOSIS — M19012 Primary osteoarthritis, left shoulder: Secondary | ICD-10-CM | POA: Diagnosis not present

## 2020-02-06 DIAGNOSIS — S82151D Displaced fracture of right tibial tuberosity, subsequent encounter for closed fracture with routine healing: Secondary | ICD-10-CM | POA: Diagnosis not present

## 2020-02-06 DIAGNOSIS — M169 Osteoarthritis of hip, unspecified: Secondary | ICD-10-CM | POA: Diagnosis not present

## 2020-02-06 DIAGNOSIS — M5136 Other intervertebral disc degeneration, lumbar region: Secondary | ICD-10-CM | POA: Diagnosis not present

## 2020-02-06 DIAGNOSIS — G8929 Other chronic pain: Secondary | ICD-10-CM | POA: Diagnosis not present

## 2020-02-07 DIAGNOSIS — M47816 Spondylosis without myelopathy or radiculopathy, lumbar region: Secondary | ICD-10-CM | POA: Diagnosis not present

## 2020-02-07 DIAGNOSIS — M19011 Primary osteoarthritis, right shoulder: Secondary | ICD-10-CM | POA: Diagnosis not present

## 2020-02-07 DIAGNOSIS — M179 Osteoarthritis of knee, unspecified: Secondary | ICD-10-CM | POA: Diagnosis not present

## 2020-02-07 DIAGNOSIS — M169 Osteoarthritis of hip, unspecified: Secondary | ICD-10-CM | POA: Diagnosis not present

## 2020-02-07 DIAGNOSIS — M19012 Primary osteoarthritis, left shoulder: Secondary | ICD-10-CM | POA: Diagnosis not present

## 2020-02-07 DIAGNOSIS — S82151D Displaced fracture of right tibial tuberosity, subsequent encounter for closed fracture with routine healing: Secondary | ICD-10-CM | POA: Diagnosis not present

## 2020-02-07 DIAGNOSIS — G8929 Other chronic pain: Secondary | ICD-10-CM | POA: Diagnosis not present

## 2020-02-07 DIAGNOSIS — I110 Hypertensive heart disease with heart failure: Secondary | ICD-10-CM | POA: Diagnosis not present

## 2020-02-07 DIAGNOSIS — M5136 Other intervertebral disc degeneration, lumbar region: Secondary | ICD-10-CM | POA: Diagnosis not present

## 2020-02-08 DIAGNOSIS — I495 Sick sinus syndrome: Secondary | ICD-10-CM | POA: Diagnosis not present

## 2020-02-08 DIAGNOSIS — Z950 Presence of cardiac pacemaker: Secondary | ICD-10-CM | POA: Diagnosis not present

## 2020-02-13 ENCOUNTER — Emergency Department
Admission: EM | Admit: 2020-02-13 | Discharge: 2020-02-14 | Disposition: A | Discharge status: Home / Self Care | Payer: Medicare HMO | Attending: Emergency Medicine | Admitting: Emergency Medicine

## 2020-02-13 ENCOUNTER — Other Ambulatory Visit: Payer: Self-pay

## 2020-02-13 ENCOUNTER — Emergency Department: Payer: Medicare HMO

## 2020-02-13 ENCOUNTER — Encounter: Payer: Self-pay | Admitting: Emergency Medicine

## 2020-02-13 DIAGNOSIS — J9811 Atelectasis: Secondary | ICD-10-CM | POA: Diagnosis not present

## 2020-02-13 DIAGNOSIS — I10 Essential (primary) hypertension: Secondary | ICD-10-CM | POA: Diagnosis not present

## 2020-02-13 DIAGNOSIS — R069 Unspecified abnormalities of breathing: Secondary | ICD-10-CM | POA: Diagnosis not present

## 2020-02-13 DIAGNOSIS — R0602 Shortness of breath: Secondary | ICD-10-CM | POA: Insufficient documentation

## 2020-02-13 DIAGNOSIS — Z5321 Procedure and treatment not carried out due to patient leaving prior to being seen by health care provider: Secondary | ICD-10-CM | POA: Insufficient documentation

## 2020-02-13 DIAGNOSIS — R0689 Other abnormalities of breathing: Secondary | ICD-10-CM | POA: Diagnosis not present

## 2020-02-13 DIAGNOSIS — J449 Chronic obstructive pulmonary disease, unspecified: Secondary | ICD-10-CM | POA: Insufficient documentation

## 2020-02-13 DIAGNOSIS — I509 Heart failure, unspecified: Secondary | ICD-10-CM | POA: Diagnosis not present

## 2020-02-13 DIAGNOSIS — R0902 Hypoxemia: Secondary | ICD-10-CM | POA: Diagnosis not present

## 2020-02-13 DIAGNOSIS — J90 Pleural effusion, not elsewhere classified: Secondary | ICD-10-CM | POA: Diagnosis not present

## 2020-02-13 LAB — BASIC METABOLIC PANEL
Anion gap: 11 (ref 5–15)
BUN: 17 mg/dL (ref 8–23)
CO2: 28 mmol/L (ref 22–32)
Calcium: 9.9 mg/dL (ref 8.9–10.3)
Chloride: 100 mmol/L (ref 98–111)
Creatinine, Ser: 0.72 mg/dL (ref 0.44–1.00)
GFR calc Af Amer: >60 mL/min (ref >60)
GFR calc non Af Amer: >60 mL/min (ref >60)
Glucose, Bld: 129 mg/dL — ABNORMAL HIGH (ref 70–99)
Potassium: 4 mmol/L (ref 3.5–5.1)
Sodium: 139 mmol/L (ref 135–145)

## 2020-02-13 LAB — CBC
HCT: 35.6 % — ABNORMAL LOW (ref 36.0–46.0)
Hemoglobin: 11.1 g/dL — ABNORMAL LOW (ref 12.0–15.0)
MCH: 27.1 pg (ref 26.0–34.0)
MCHC: 31.2 g/dL (ref 30.0–36.0)
MCV: 87 fL (ref 80.0–100.0)
Platelets: 293 10*3/uL (ref 150–400)
RBC: 4.09 MIL/uL (ref 3.87–5.11)
RDW: 15.6 % — ABNORMAL HIGH (ref 11.5–15.5)
WBC: 6.8 10*3/uL (ref 4.0–10.5)
nRBC: 0 % (ref 0.0–0.2)

## 2020-02-13 LAB — TROPONIN I (HIGH SENSITIVITY)
Troponin I (High Sensitivity): 12 ng/L (ref <18)
Troponin I (High Sensitivity): 12 ng/L (ref <18)

## 2020-02-13 NOTE — ED Triage Notes (Signed)
First Nurse Note:  Patient presents to the ED via EMS for shortness of breath.  Patient has history of CHF and COPD.  Patient reports increased shortness of breath x 4 days.  Per EMS, when the fire dept. Arrived patient's oxygen saturation was in the mid-70s on room air.  EMS placed patient on 4L [place] and patient's oxygen level is around 95%.  Patient is alert and oriented x 4.

## 2020-02-13 NOTE — ED Triage Notes (Signed)
Says she is here because her family is worried about her breathing.  Says she was unable to sleep last night due to sor

## 2020-02-13 NOTE — ED Notes (Signed)
First BP was 200/110. Second BP was 188/91. RN Vicente Males notified.

## 2020-02-16 DIAGNOSIS — M47816 Spondylosis without myelopathy or radiculopathy, lumbar region: Secondary | ICD-10-CM | POA: Diagnosis not present

## 2020-02-16 DIAGNOSIS — G8929 Other chronic pain: Secondary | ICD-10-CM | POA: Diagnosis not present

## 2020-02-16 DIAGNOSIS — M179 Osteoarthritis of knee, unspecified: Secondary | ICD-10-CM | POA: Diagnosis not present

## 2020-02-16 DIAGNOSIS — M5136 Other intervertebral disc degeneration, lumbar region: Secondary | ICD-10-CM | POA: Diagnosis not present

## 2020-02-16 DIAGNOSIS — M169 Osteoarthritis of hip, unspecified: Secondary | ICD-10-CM | POA: Diagnosis not present

## 2020-02-16 DIAGNOSIS — M19011 Primary osteoarthritis, right shoulder: Secondary | ICD-10-CM | POA: Diagnosis not present

## 2020-02-16 DIAGNOSIS — M19012 Primary osteoarthritis, left shoulder: Secondary | ICD-10-CM | POA: Diagnosis not present

## 2020-02-16 DIAGNOSIS — I110 Hypertensive heart disease with heart failure: Secondary | ICD-10-CM | POA: Diagnosis not present

## 2020-02-16 DIAGNOSIS — S82151D Displaced fracture of right tibial tuberosity, subsequent encounter for closed fracture with routine healing: Secondary | ICD-10-CM | POA: Diagnosis not present

## 2020-02-17 DIAGNOSIS — M47816 Spondylosis without myelopathy or radiculopathy, lumbar region: Secondary | ICD-10-CM | POA: Diagnosis not present

## 2020-02-17 DIAGNOSIS — S82151D Displaced fracture of right tibial tuberosity, subsequent encounter for closed fracture with routine healing: Secondary | ICD-10-CM | POA: Diagnosis not present

## 2020-02-17 DIAGNOSIS — I110 Hypertensive heart disease with heart failure: Secondary | ICD-10-CM | POA: Diagnosis not present

## 2020-02-17 DIAGNOSIS — G8929 Other chronic pain: Secondary | ICD-10-CM | POA: Diagnosis not present

## 2020-02-17 DIAGNOSIS — M169 Osteoarthritis of hip, unspecified: Secondary | ICD-10-CM | POA: Diagnosis not present

## 2020-02-17 DIAGNOSIS — Z9889 Other specified postprocedural states: Secondary | ICD-10-CM | POA: Diagnosis not present

## 2020-02-17 DIAGNOSIS — M5136 Other intervertebral disc degeneration, lumbar region: Secondary | ICD-10-CM | POA: Diagnosis not present

## 2020-02-17 DIAGNOSIS — M179 Osteoarthritis of knee, unspecified: Secondary | ICD-10-CM | POA: Diagnosis not present

## 2020-02-17 DIAGNOSIS — M19012 Primary osteoarthritis, left shoulder: Secondary | ICD-10-CM | POA: Diagnosis not present

## 2020-02-17 DIAGNOSIS — M19011 Primary osteoarthritis, right shoulder: Secondary | ICD-10-CM | POA: Diagnosis not present

## 2020-02-20 DIAGNOSIS — M5136 Other intervertebral disc degeneration, lumbar region: Secondary | ICD-10-CM | POA: Diagnosis not present

## 2020-02-20 DIAGNOSIS — S82151D Displaced fracture of right tibial tuberosity, subsequent encounter for closed fracture with routine healing: Secondary | ICD-10-CM | POA: Diagnosis not present

## 2020-02-20 DIAGNOSIS — G8929 Other chronic pain: Secondary | ICD-10-CM | POA: Diagnosis not present

## 2020-02-20 DIAGNOSIS — M179 Osteoarthritis of knee, unspecified: Secondary | ICD-10-CM | POA: Diagnosis not present

## 2020-02-20 DIAGNOSIS — M19011 Primary osteoarthritis, right shoulder: Secondary | ICD-10-CM | POA: Diagnosis not present

## 2020-02-20 DIAGNOSIS — I110 Hypertensive heart disease with heart failure: Secondary | ICD-10-CM | POA: Diagnosis not present

## 2020-02-20 DIAGNOSIS — M19012 Primary osteoarthritis, left shoulder: Secondary | ICD-10-CM | POA: Diagnosis not present

## 2020-02-20 DIAGNOSIS — M169 Osteoarthritis of hip, unspecified: Secondary | ICD-10-CM | POA: Diagnosis not present

## 2020-02-20 DIAGNOSIS — M47816 Spondylosis without myelopathy or radiculopathy, lumbar region: Secondary | ICD-10-CM | POA: Diagnosis not present

## 2020-02-21 DIAGNOSIS — M5136 Other intervertebral disc degeneration, lumbar region: Secondary | ICD-10-CM | POA: Diagnosis not present

## 2020-02-21 DIAGNOSIS — G8929 Other chronic pain: Secondary | ICD-10-CM | POA: Diagnosis not present

## 2020-02-21 DIAGNOSIS — I110 Hypertensive heart disease with heart failure: Secondary | ICD-10-CM | POA: Diagnosis not present

## 2020-02-21 DIAGNOSIS — Z79899 Other long term (current) drug therapy: Secondary | ICD-10-CM | POA: Diagnosis not present

## 2020-02-21 DIAGNOSIS — M169 Osteoarthritis of hip, unspecified: Secondary | ICD-10-CM | POA: Diagnosis not present

## 2020-02-21 DIAGNOSIS — E1142 Type 2 diabetes mellitus with diabetic polyneuropathy: Secondary | ICD-10-CM | POA: Diagnosis not present

## 2020-02-21 DIAGNOSIS — S82151D Displaced fracture of right tibial tuberosity, subsequent encounter for closed fracture with routine healing: Secondary | ICD-10-CM | POA: Diagnosis not present

## 2020-02-21 DIAGNOSIS — I5022 Chronic systolic (congestive) heart failure: Secondary | ICD-10-CM | POA: Diagnosis not present

## 2020-02-21 DIAGNOSIS — E785 Hyperlipidemia, unspecified: Secondary | ICD-10-CM | POA: Diagnosis not present

## 2020-02-21 DIAGNOSIS — M179 Osteoarthritis of knee, unspecified: Secondary | ICD-10-CM | POA: Diagnosis not present

## 2020-02-21 DIAGNOSIS — M47816 Spondylosis without myelopathy or radiculopathy, lumbar region: Secondary | ICD-10-CM | POA: Diagnosis not present

## 2020-02-21 DIAGNOSIS — M19011 Primary osteoarthritis, right shoulder: Secondary | ICD-10-CM | POA: Diagnosis not present

## 2020-02-21 DIAGNOSIS — Z794 Long term (current) use of insulin: Secondary | ICD-10-CM | POA: Diagnosis not present

## 2020-02-21 DIAGNOSIS — M19012 Primary osteoarthritis, left shoulder: Secondary | ICD-10-CM | POA: Diagnosis not present

## 2020-02-21 DIAGNOSIS — E1169 Type 2 diabetes mellitus with other specified complication: Secondary | ICD-10-CM | POA: Diagnosis not present

## 2020-02-24 ENCOUNTER — Emergency Department: Payer: Medicare HMO

## 2020-02-24 ENCOUNTER — Encounter: Payer: Self-pay | Admitting: Emergency Medicine

## 2020-02-24 ENCOUNTER — Inpatient Hospital Stay
Admission: EM | Admit: 2020-02-24 | Discharge: 2020-02-27 | DRG: 291 | Disposition: A | Discharge status: Home Health | Payer: Medicare HMO | Attending: Internal Medicine | Admitting: Internal Medicine

## 2020-02-24 ENCOUNTER — Other Ambulatory Visit: Payer: Self-pay

## 2020-02-24 DIAGNOSIS — Z951 Presence of aortocoronary bypass graft: Secondary | ICD-10-CM | POA: Diagnosis not present

## 2020-02-24 DIAGNOSIS — Z8249 Family history of ischemic heart disease and other diseases of the circulatory system: Secondary | ICD-10-CM

## 2020-02-24 DIAGNOSIS — E669 Obesity, unspecified: Secondary | ICD-10-CM | POA: Diagnosis present

## 2020-02-24 DIAGNOSIS — M169 Osteoarthritis of hip, unspecified: Secondary | ICD-10-CM | POA: Diagnosis not present

## 2020-02-24 DIAGNOSIS — Z9841 Cataract extraction status, right eye: Secondary | ICD-10-CM | POA: Diagnosis not present

## 2020-02-24 DIAGNOSIS — Z794 Long term (current) use of insulin: Secondary | ICD-10-CM

## 2020-02-24 DIAGNOSIS — E785 Hyperlipidemia, unspecified: Secondary | ICD-10-CM | POA: Diagnosis present

## 2020-02-24 DIAGNOSIS — I5033 Acute on chronic diastolic (congestive) heart failure: Secondary | ICD-10-CM

## 2020-02-24 DIAGNOSIS — Z20822 Contact with and (suspected) exposure to covid-19: Secondary | ICD-10-CM | POA: Diagnosis present

## 2020-02-24 DIAGNOSIS — Z90710 Acquired absence of both cervix and uterus: Secondary | ICD-10-CM | POA: Diagnosis not present

## 2020-02-24 DIAGNOSIS — Z8049 Family history of malignant neoplasm of other genital organs: Secondary | ICD-10-CM

## 2020-02-24 DIAGNOSIS — I480 Paroxysmal atrial fibrillation: Secondary | ICD-10-CM | POA: Diagnosis present

## 2020-02-24 DIAGNOSIS — I130 Hypertensive heart and chronic kidney disease with heart failure and stage 1 through stage 4 chronic kidney disease, or unspecified chronic kidney disease: Secondary | ICD-10-CM | POA: Diagnosis not present

## 2020-02-24 DIAGNOSIS — E1122 Type 2 diabetes mellitus with diabetic chronic kidney disease: Secondary | ICD-10-CM | POA: Diagnosis present

## 2020-02-24 DIAGNOSIS — Z96643 Presence of artificial hip joint, bilateral: Secondary | ICD-10-CM | POA: Diagnosis present

## 2020-02-24 DIAGNOSIS — M19012 Primary osteoarthritis, left shoulder: Secondary | ICD-10-CM | POA: Diagnosis not present

## 2020-02-24 DIAGNOSIS — K219 Gastro-esophageal reflux disease without esophagitis: Secondary | ICD-10-CM | POA: Diagnosis present

## 2020-02-24 DIAGNOSIS — N189 Chronic kidney disease, unspecified: Secondary | ICD-10-CM | POA: Diagnosis present

## 2020-02-24 DIAGNOSIS — S82151D Displaced fracture of right tibial tuberosity, subsequent encounter for closed fracture with routine healing: Secondary | ICD-10-CM | POA: Diagnosis not present

## 2020-02-24 DIAGNOSIS — Z950 Presence of cardiac pacemaker: Secondary | ICD-10-CM

## 2020-02-24 DIAGNOSIS — Z7901 Long term (current) use of anticoagulants: Secondary | ICD-10-CM

## 2020-02-24 DIAGNOSIS — Z96651 Presence of right artificial knee joint: Secondary | ICD-10-CM | POA: Diagnosis present

## 2020-02-24 DIAGNOSIS — Z91128 Patient's intentional underdosing of medication regimen for other reason: Secondary | ICD-10-CM

## 2020-02-24 DIAGNOSIS — T501X6A Underdosing of loop [high-ceiling] diuretics, initial encounter: Secondary | ICD-10-CM | POA: Diagnosis present

## 2020-02-24 DIAGNOSIS — Z888 Allergy status to other drugs, medicaments and biological substances status: Secondary | ICD-10-CM

## 2020-02-24 DIAGNOSIS — I517 Cardiomegaly: Secondary | ICD-10-CM | POA: Diagnosis not present

## 2020-02-24 DIAGNOSIS — M5136 Other intervertebral disc degeneration, lumbar region: Secondary | ICD-10-CM | POA: Diagnosis not present

## 2020-02-24 DIAGNOSIS — E8779 Other fluid overload: Secondary | ICD-10-CM

## 2020-02-24 DIAGNOSIS — R0902 Hypoxemia: Secondary | ICD-10-CM | POA: Diagnosis not present

## 2020-02-24 DIAGNOSIS — J9811 Atelectasis: Secondary | ICD-10-CM | POA: Diagnosis not present

## 2020-02-24 DIAGNOSIS — J9601 Acute respiratory failure with hypoxia: Secondary | ICD-10-CM | POA: Diagnosis not present

## 2020-02-24 DIAGNOSIS — J9621 Acute and chronic respiratory failure with hypoxia: Secondary | ICD-10-CM

## 2020-02-24 DIAGNOSIS — I509 Heart failure, unspecified: Secondary | ICD-10-CM

## 2020-02-24 DIAGNOSIS — Z8679 Personal history of other diseases of the circulatory system: Secondary | ICD-10-CM

## 2020-02-24 DIAGNOSIS — Z961 Presence of intraocular lens: Secondary | ICD-10-CM | POA: Diagnosis present

## 2020-02-24 DIAGNOSIS — Z880 Allergy status to penicillin: Secondary | ICD-10-CM

## 2020-02-24 DIAGNOSIS — J90 Pleural effusion, not elsewhere classified: Secondary | ICD-10-CM | POA: Diagnosis not present

## 2020-02-24 DIAGNOSIS — Z79899 Other long term (current) drug therapy: Secondary | ICD-10-CM

## 2020-02-24 DIAGNOSIS — I110 Hypertensive heart disease with heart failure: Secondary | ICD-10-CM | POA: Diagnosis not present

## 2020-02-24 DIAGNOSIS — G894 Chronic pain syndrome: Secondary | ICD-10-CM | POA: Diagnosis present

## 2020-02-24 DIAGNOSIS — G8929 Other chronic pain: Secondary | ICD-10-CM | POA: Diagnosis not present

## 2020-02-24 DIAGNOSIS — M19011 Primary osteoarthritis, right shoulder: Secondary | ICD-10-CM | POA: Diagnosis not present

## 2020-02-24 DIAGNOSIS — I2510 Atherosclerotic heart disease of native coronary artery without angina pectoris: Secondary | ICD-10-CM | POA: Diagnosis not present

## 2020-02-24 DIAGNOSIS — Z6833 Body mass index (BMI) 33.0-33.9, adult: Secondary | ICD-10-CM | POA: Diagnosis not present

## 2020-02-24 DIAGNOSIS — Y929 Unspecified place or not applicable: Secondary | ICD-10-CM | POA: Diagnosis not present

## 2020-02-24 DIAGNOSIS — E8770 Fluid overload, unspecified: Secondary | ICD-10-CM | POA: Diagnosis not present

## 2020-02-24 DIAGNOSIS — Z9842 Cataract extraction status, left eye: Secondary | ICD-10-CM

## 2020-02-24 DIAGNOSIS — R0602 Shortness of breath: Secondary | ICD-10-CM | POA: Diagnosis not present

## 2020-02-24 DIAGNOSIS — E1142 Type 2 diabetes mellitus with diabetic polyneuropathy: Secondary | ICD-10-CM | POA: Diagnosis not present

## 2020-02-24 DIAGNOSIS — M47816 Spondylosis without myelopathy or radiculopathy, lumbar region: Secondary | ICD-10-CM | POA: Diagnosis not present

## 2020-02-24 DIAGNOSIS — R600 Localized edema: Secondary | ICD-10-CM | POA: Diagnosis not present

## 2020-02-24 DIAGNOSIS — M179 Osteoarthritis of knee, unspecified: Secondary | ICD-10-CM | POA: Diagnosis not present

## 2020-02-24 DIAGNOSIS — Z820 Family history of epilepsy and other diseases of the nervous system: Secondary | ICD-10-CM

## 2020-02-24 DIAGNOSIS — I10 Essential (primary) hypertension: Secondary | ICD-10-CM | POA: Diagnosis present

## 2020-02-24 LAB — CBC WITH DIFFERENTIAL/PLATELET
Abs Immature Granulocytes: 0.02 10*3/uL (ref 0.00–0.07)
Basophils Absolute: 0.1 10*3/uL (ref 0.0–0.1)
Basophils Relative: 1 %
Eosinophils Absolute: 0.3 10*3/uL (ref 0.0–0.5)
Eosinophils Relative: 5 %
HCT: 32.7 % — ABNORMAL LOW (ref 36.0–46.0)
Hemoglobin: 10.4 g/dL — ABNORMAL LOW (ref 12.0–15.0)
Immature Granulocytes: 0 %
Lymphocytes Relative: 19 %
Lymphs Abs: 1.2 10*3/uL (ref 0.7–4.0)
MCH: 27.2 pg (ref 26.0–34.0)
MCHC: 31.8 g/dL (ref 30.0–36.0)
MCV: 85.4 fL (ref 80.0–100.0)
Monocytes Absolute: 0.6 10*3/uL (ref 0.1–1.0)
Monocytes Relative: 10 %
Neutro Abs: 4.1 10*3/uL (ref 1.7–7.7)
Neutrophils Relative %: 65 %
Platelets: 289 10*3/uL (ref 150–400)
RBC: 3.83 MIL/uL — ABNORMAL LOW (ref 3.87–5.11)
RDW: 16.1 % — ABNORMAL HIGH (ref 11.5–15.5)
WBC: 6.2 10*3/uL (ref 4.0–10.5)
nRBC: 0 % (ref 0.0–0.2)

## 2020-02-24 LAB — COMPREHENSIVE METABOLIC PANEL
ALT: 11 U/L (ref 0–44)
AST: 17 U/L (ref 15–41)
Albumin: 3.9 g/dL (ref 3.5–5.0)
Alkaline Phosphatase: 78 U/L (ref 38–126)
Anion gap: 9 (ref 5–15)
BUN: 17 mg/dL (ref 8–23)
CO2: 29 mmol/L (ref 22–32)
Calcium: 9.5 mg/dL (ref 8.9–10.3)
Chloride: 101 mmol/L (ref 98–111)
Creatinine, Ser: 0.73 mg/dL (ref 0.44–1.00)
GFR calc Af Amer: >60 mL/min (ref >60)
GFR calc non Af Amer: >60 mL/min (ref >60)
Glucose, Bld: 112 mg/dL — ABNORMAL HIGH (ref 70–99)
Potassium: 3.8 mmol/L (ref 3.5–5.1)
Sodium: 139 mmol/L (ref 135–145)
Total Bilirubin: 0.8 mg/dL (ref 0.3–1.2)
Total Protein: 7.5 g/dL (ref 6.5–8.1)

## 2020-02-24 MED ORDER — FUROSEMIDE 10 MG/ML IJ SOLN
40.0000 mg | Freq: Once | INTRAMUSCULAR | Status: AC
  Administered 2020-02-24: 40 mg via INTRAVENOUS
  Filled 2020-02-24: qty 4

## 2020-02-24 NOTE — ED Triage Notes (Signed)
Pt to ED via POV c/o shortness of breath. Pt states that she woke up this morning and was having trouble breathing. Pt states that she called her PCP and they told her to come to the ED. Pt has wheezing on assessment. Pt is able to speak in complete sentences without difficult at this time.

## 2020-02-24 NOTE — ED Notes (Signed)
Pt placed on 2 liters via [place]. 

## 2020-02-25 DIAGNOSIS — I5033 Acute on chronic diastolic (congestive) heart failure: Secondary | ICD-10-CM

## 2020-02-25 DIAGNOSIS — Z950 Presence of cardiac pacemaker: Secondary | ICD-10-CM

## 2020-02-25 DIAGNOSIS — Z8679 Personal history of other diseases of the circulatory system: Secondary | ICD-10-CM

## 2020-02-25 LAB — T4, FREE: Free T4: 1.44 ng/dL — ABNORMAL HIGH (ref 0.61–1.12)

## 2020-02-25 LAB — GLUCOSE, CAPILLARY
Glucose-Capillary: 71 mg/dL (ref 70–99)
Glucose-Capillary: 142 mg/dL — ABNORMAL HIGH (ref 70–99)
Glucose-Capillary: 149 mg/dL — ABNORMAL HIGH (ref 70–99)
Glucose-Capillary: 97 mg/dL (ref 70–99)

## 2020-02-25 LAB — TSH: TSH: 2.691 u[IU]/mL (ref 0.350–4.500)

## 2020-02-25 LAB — SARS CORONAVIRUS 2 BY RT PCR (HOSPITAL ORDER, PERFORMED IN ~~LOC~~ HOSPITAL LAB): SARS Coronavirus 2: NEGATIVE

## 2020-02-25 LAB — TROPONIN I (HIGH SENSITIVITY): Troponin I (High Sensitivity): 5 ng/L (ref <18)

## 2020-02-25 LAB — BRAIN NATRIURETIC PEPTIDE: B Natriuretic Peptide: 223.3 pg/mL — ABNORMAL HIGH (ref 0.0–100.0)

## 2020-02-25 MED ORDER — SODIUM CHLORIDE 0.9% FLUSH: 3.0000 mL | INTRAVENOUS | Status: DC | PRN

## 2020-02-25 MED ORDER — CARVEDILOL 6.25 MG PO TABS
6.2500 mg | ORAL_TABLET | Freq: Two times a day (BID) | ORAL | Status: DC
  Administered 2020-02-25 – 2020-02-27 (×5): 6.25 mg via ORAL
  Filled 2020-02-25 (×5): qty 1

## 2020-02-25 MED ORDER — FUROSEMIDE 10 MG/ML IJ SOLN: 40.0000 mg | Freq: Two times a day (BID) | INTRAMUSCULAR | Status: DC

## 2020-02-25 MED ORDER — FUROSEMIDE 10 MG/ML IJ SOLN
40.0000 mg | Freq: Two times a day (BID) | INTRAMUSCULAR | Status: DC
  Administered 2020-02-25: 40 mg via INTRAVENOUS
  Filled 2020-02-25: qty 4

## 2020-02-25 MED ORDER — LISINOPRIL 5 MG PO TABS
5.0000 mg | ORAL_TABLET | Freq: Every day | ORAL | Status: DC
  Filled 2020-02-25: qty 1

## 2020-02-25 MED ORDER — LABETALOL HCL 5 MG/ML IV SOLN
10.0000 mg | INTRAVENOUS | Status: DC | PRN
  Administered 2020-02-25: 10 mg via INTRAVENOUS
  Filled 2020-02-25: qty 4

## 2020-02-25 MED ORDER — ONDANSETRON HCL 4 MG/2ML IJ SOLN: 4.0000 mg | Freq: Four times a day (QID) | INTRAMUSCULAR | Status: DC | PRN

## 2020-02-25 MED ORDER — APIXABAN 5 MG PO TABS
5.0000 mg | ORAL_TABLET | Freq: Two times a day (BID) | ORAL | Status: DC
  Administered 2020-02-25 – 2020-02-27 (×5): 5 mg via ORAL
  Filled 2020-02-25 (×5): qty 1

## 2020-02-25 MED ORDER — SODIUM CHLORIDE 0.9% FLUSH
3.0000 mL | Freq: Two times a day (BID) | INTRAVENOUS | Status: DC
  Administered 2020-02-25 – 2020-02-27 (×4): 3 mL via INTRAVENOUS

## 2020-02-25 MED ORDER — INSULIN ASPART 100 UNIT/ML ~~LOC~~ SOLN
0.0000 [IU] | Freq: Three times a day (TID) | SUBCUTANEOUS | Status: DC
  Administered 2020-02-25 – 2020-02-27 (×4): 2 [IU] via SUBCUTANEOUS
  Filled 2020-02-25 (×4): qty 1

## 2020-02-25 MED ORDER — ACETAMINOPHEN 325 MG PO TABS
650.0000 mg | ORAL_TABLET | ORAL | Status: DC | PRN
  Administered 2020-02-25: 650 mg via ORAL
  Filled 2020-02-25: qty 2

## 2020-02-25 MED ORDER — INSULIN ASPART 100 UNIT/ML ~~LOC~~ SOLN: 0.0000 [IU] | Freq: Every day | SUBCUTANEOUS | Status: DC

## 2020-02-25 MED ORDER — SODIUM CHLORIDE 0.9 % IV SOLN: 250.0000 mL | INTRAVENOUS | Status: DC | PRN

## 2020-02-25 MED ORDER — LISINOPRIL 10 MG PO TABS
10.0000 mg | ORAL_TABLET | Freq: Every day | ORAL | Status: DC
  Administered 2020-02-25 – 2020-02-27 (×3): 10 mg via ORAL
  Filled 2020-02-25 (×3): qty 1

## 2020-02-25 MED ORDER — FUROSEMIDE 40 MG PO TABS
40.0000 mg | ORAL_TABLET | Freq: Every day | ORAL | Status: DC
  Administered 2020-02-25 – 2020-02-27 (×3): 40 mg via ORAL
  Filled 2020-02-25 (×2): qty 1

## 2020-02-25 NOTE — ED Notes (Signed)
Dr Duncan in to see pt 

## 2020-02-25 NOTE — ED Notes (Signed)
Pt repositoned in bed for comfort. 

## 2020-02-25 NOTE — ED Notes (Signed)
ED TO INPATIENT HANDOFF REPORT  ED Nurse Name and Phone #: Valetta Fuller [phone]  S Name/Age/Gender Amber [name] 86 y.o. female Room/Bed: ED11A/ED11A  Code Status   Code Status: Full Code  Home/SNF/Other Home Patient oriented to: self and situation Is this baseline? No   Triage Complete: Triage complete  Chief Complaint Acute CHF (congestive heart failure) ([place]) [I50.9]  Triage Note Pt to ED via POV c/o shortness of breath. Pt states that she woke up this morning and was having trouble breathing. Pt states that she called her PCP and they told her to come to the ED. Pt has wheezing on assessment. Pt is able to speak in complete sentences without difficult at this time.     [facility] Emergency Department Provider Note   ____________________________________________   First MD Initiated Contact with Patient 02/24/20 2323     (approximate)  I have reviewed the triage vital signs and the nursing notes.   HISTORY  Chief Complaint Shortness of Breath    HPI Amber [name] is a 87 y.o. female here for evaluation of shortness of breath  Patient seen by her physical therapist, told her oxygen level was low and she was feeling short of breath this morning.  Talk to her primary who referred her here.  She reports her oxygen also in the 70-80's range.  She has been noticing shortness of breath especially in the mornings and when she lays down for about the last week  She came to ER for similar concerns about a week ago but due to long wait could not stay to be seen by Dr.  Aurora Mask Dr. Doy Hutching.  Does have a history of congestive heart failure used to take fluid pills but stopped taking them because they make her urinate too frequently.  Continues to take her blood thinner which she reports prescription ran out today for atrial fibrillation  No chest pain.  No fevers or chills.  No nausea or vomiting   Past Medical History:  Diagnosis Date  . Anemia    . Anxiety   . Arthritis   . Benign neoplasm of colon   . CHF (congestive heart failure) ([place])   . Chronic kidney disease   . Chronic pain   . Complication of anesthesia 1980's   hard time waking up  . Coronary artery disease with unspecified angina pectoris   . Cough   . Diabetes mellitus without complication ([place])   . Essential hypertension   . GERD (gastroesophageal reflux disease)   . High risk medication use   . Hyperlipidemia   . Plantar fascial fibromatosis   . Presence of permanent cardiac pacemaker   . Shortness of breath dyspnea     Patient Active Problem List   Diagnosis Date Noted  . Acute CHF (congestive heart failure) ([place]) 02/24/2020  . Complete heart block ([place]) 01/24/2020  . Injury   . S/P ORIF (open reduction internal fixation) fracture   . Hypotension due to hypovolemia   . Closed displaced fracture of right tibial tuberosity   . Pain   . Syncope and collapse 12/06/2019  . Status post total hip replacement, left 02/09/2018  . Primary osteoarthritis of left hip 01/06/2018  . Chronic pain syndrome 04/09/2017  . Other specified health status 04/08/2017  . Disorder of bone, unspecified 04/08/2017  . Long term current use of opiate analgesic 04/08/2017  . Chronic low back pain (Primary Area of Pain) (Bilateral) (L>R) 04/08/2017  . Chronic pain  of both lower extremities  (Tertiary Area of Pain) 04/08/2017  . Chronic groin pain (Secondary Area of Pain) 04/08/2017  . Chronic neck pain (Fourth Area of Pain) (Bilateral) (L>R) 04/08/2017  . Chronic sacroiliac joint pain 04/08/2017  . Other long term (current) drug therapy 04/08/2017  . Syncope 02/09/2017  . Asthmatic bronchitis 01/26/2017  . Fall 10/06/2016  . Anxiety 08/08/2016  . TIA (transient ischemic attack) 07/25/2015  . Sick sinus syndrome ([place]) 03/14/2015  . Chronic heart failure ([place]) 01/05/2015  . Fracture dislocation of ankle 07/27/2014  . IDDM (insulin dependent diabetes mellitus) 07/27/2014  .  Essential hypertension 07/27/2014  . Multiple rib fractures 07/27/2014  . 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 Anemia 12/06/2013    Past Surgical History:  Procedure Laterality Date  . ABDOMINAL HYSTERECTOMY    . BACK SURGERY  1960's   cage and screws in lower back  . CATARACT EXTRACTION W/ INTRAOCULAR LENS  IMPLANT, BILATERAL Bilateral   . CORONARY ARTERY BYPASS GRAFT  2008   triple  . HARDWARE REMOVAL Right 12/08/2019   Procedure: HARDWARE REMOVAL;  Surgeon: Hessie Knows, MD;  Location: [facility] ORS;  Service: Orthopedics;  Laterality: Right;  . JOINT REPLACEMENT Right    hip and knee  . ORIF ANKLE FRACTURE Right 07/27/2014   Procedure: OPEN REDUCTION INTERNAL FIXATION (ORIF) ANKLE FRACTURE;  Surgeon: Alta Corning, MD;  Location: [place];  Service: Orthopedics;  Laterality: Right;  . ORIF TIBIA FRACTURE Right 12/08/2019   Procedure: OPEN REDUCTION INTERNAL FIXATION (ORIF) TIBIA FRACTURE;  Surgeon: Hessie Knows, MD;  Location: [facility];  Service: Orthopedics;  Laterality: Right;  . PACEMAKER INSERTION Left 03/14/2015   Procedure: INSERTION PACEMAKER;  Surgeon: Isaias Cowman, MD;  Location: [facility];  Service: Cardiovascular;  Laterality: Left;  . PACEMAKER LEADLESS INSERTION N/A 01/24/2020   Procedure: PACEMAKER LEADLESS INSERTION;  Surgeon: Isaias Cowman, MD;  Location: [place] [facility];  Service: Cardiovascular;  Laterality: N/A;  . ROTATOR CUFF REPAIR Left   . TOTAL HIP ARTHROPLASTY Right   . TOTAL HIP ARTHROPLASTY Left 02/09/2018   Procedure: TOTAL HIP ARTHROPLASTY ANTERIOR APPROACH;  Surgeon: Hessie Knows, MD;  Location: [facility];  Service: Orthopedics;  Laterality: Left;  . TOTAL KNEE ARTHROPLASTY Right   . VEIN LIGATION AND STRIPPING      Prior to Admission medications   Medication Sig Start Date End Date Taking? Authorizing Provider  acetaminophen (TYLENOL) 500 MG tablet Take 1,000 mg by mouth every 6 (six) hours as needed for mild pain or headache.     [provider]  albuterol (PROVENTIL HFA;VENTOLIN HFA) 108 (90 Base) MCG/ACT inhaler Inhale 2 puffs into the lungs every 6 (six) hours as needed for wheezing or shortness of breath. 01/28/17   Loletha Grayer, MD  ALPRAZolam Duanne Moron) 1 MG tablet Take 1 tablet (1 mg total) by mouth 2 (two) times daily as needed for anxiety or sleep. 12/12/19   Fritzi Mandes, MD  amLODipine (NORVASC) 10 MG tablet Take 5 mg by mouth daily.     [provider]  apixaban (ELIQUIS) 5 MG TABS tablet Take 5 mg by mouth 2 (two) times daily.    [provider]  calcium-vitamin D (OSCAL WITH D) 500-200 MG-UNIT tablet Take 1 tablet by mouth daily.    [provider]  carvedilol (COREG) 6.25 MG tablet Take 1 tablet (6.25 mg total) by mouth 2 (two) times daily with a meal. 01/28/17   Loletha Grayer, MD  ferrous sulfate 325 (65  FE) MG tablet Take 1 tablet (325 mg total) by mouth 2 (two) times daily with a meal. Patient not taking: Reported on 01/24/2020 12/12/19   Fritzi Mandes, MD  gabapentin (NEURONTIN) 100 MG capsule Take 200 mg by mouth 3 (three) times daily.     [provider]  insulin glargine (LANTUS) 100 UNIT/ML injection Inject 22 Units into the skin daily.    [provider]  levETIRAcetam (KEPPRA) 500 MG tablet Take 500 mg by mouth 2 (two) times daily.    [provider]  Multiple Vitamins-Minerals (PRESERVISION AREDS 2) CAPS Take 1 capsule by mouth daily.    [provider]  omeprazole (PRILOSEC) 40 MG capsule Take 40 mg by mouth daily.     [provider]  pravastatin (PRAVACHOL) 40 MG tablet Take 40 mg by mouth at bedtime.    [provider]    Allergies Ambien [zolpidem tartrate], Penicillins, Iodine, Succinylcholine, Etodolac, and Nsaids  Family History  Problem Relation Age of Onset  . Heart attack Mother   . Heart disease Father   . Alzheimer's disease Sister   . Cervical cancer Sister   . Heart failure Son     Social  History Social History   Tobacco Use  . Smoking status: Never Smoker  . Smokeless tobacco: Never Used  Vaping Use  . Vaping Use: Never used  Substance Use Topics  . Alcohol use: No  . Drug use: No    Review of Systems Constitutional: No fever/chills and has been fully vaccinated against Covid Eyes: No visual changes. ENT: No sore throat. Cardiovascular: Denies chest pain. Respiratory: Shortness of breath, mostly when she lays down, relieved somewhat by sitting up and does get short of breath with exertion now for at least the last week or 2 Gastrointestinal: No abdominal pain.   Genitourinary: Negative for dysuria. Musculoskeletal: Negative for back pain. Skin: Negative for rash. Neurological: Negative for headaches, areas of focal weakness or numbness.    ____________________________________________   PHYSICAL EXAM:  VITAL SIGNS: ED Triage Vitals  Enc Vitals Group     BP 02/24/20 1246 (!) 153/83     Pulse Rate 02/24/20 1243 63     Resp 02/24/20 1243 20     Temp 02/24/20 1243 98.2 F (36.8 C)     Temp Source 02/24/20 1243 Oral     SpO2 02/24/20 1242 90 %     Weight 02/24/20 1244 177 lb (80.3 kg)     Height 02/24/20 1244 4\' 11"  (1.499 m)     Head Circumference --      Peak Flow --      Pain Score 02/24/20 1244 0     Pain Loc --      Pain Edu? --      Excl. in [place]? --     Constitutional: Alert and oriented. Well appearing and in no acute distress.  Of note, I turn off her nasal cannula oxygen her oxygen saturation dropped to about 87 to 88% on room air, but cleared quickly returned to the mid 90s on 2 L nasal cannula. Eyes: Conjunctivae are normal. Head: Atraumatic. Nose: No congestion/rhinnorhea. Mouth/Throat: Mucous membranes are moist. Neck: No stridor.  Cardiovascular: Normal rate, regular rhythm. Grossly normal heart sounds.  Good peripheral circulation. Respiratory: Normal respiratory effort.  No retractions. Lungs CTAB. Gastrointestinal: Soft and  nontender. No distention. Musculoskeletal: No lower extremity tenderness nor edema. Neurologic:  Normal speech and language. No gross focal neurologic deficits are appreciated.  Skin:  Skin is warm, dry and intact. No rash noted. Psychiatric: Mood and affect are normal. Speech and behavior are normal.  ____________________________________________   LABS (all labs ordered are listed, but only abnormal results are displayed)  Labs Reviewed  CBC WITH DIFFERENTIAL/PLATELET - Abnormal; Notable for the following components:      Result Value   RBC 3.83 (*)    Hemoglobin 10.4 (*)    HCT 32.7 (*)    RDW 16.1 (*)    All other components within normal limits  COMPREHENSIVE METABOLIC PANEL - Abnormal; Notable for the following components:   Glucose, Bld 112 (*)    All other components within normal limits  SARS CORONAVIRUS 2 BY RT PCR (HOSPITAL ORDER, [facility])  BRAIN NATRIURETIC PEPTIDE  TROPONIN I (HIGH SENSITIVITY)   ____________________________________________  EKG  Surgeon reviewed entered by me at 1320 Ventricular pacing.  No evidence of acute ischemia.  QRS 190 QTc 500 Heart rate 75 ____________________________________________  RADIOLOGY  DG Chest 2 View  Result Date: 02/24/2020 CLINICAL DATA:  Onset shortness of breath when the patient woke up this morning. EXAM: CHEST - 2 VIEW COMPARISON:  PA and lateral chest 02/13/2020. FINDINGS: Small bilateral pleural effusions and mild basilar atelectasis are again seen. The lungs are otherwise clear. Cardiomegaly. Pacing device is unchanged. Loop recorder is in place. The patient is status post median sternotomy. No acute or focal bony abnormality. IMPRESSION: No change in very small bilateral pleural effusions and basilar atelectasis since the most recent exam. Electronically Signed   By: Inge Rise M.D.   On: 02/24/2020 14:29    Imaging reviewed, small bilateral pleural effusions and  atelectasis. ____________________________________________   PROCEDURES  Procedure(s) performed: None  Procedures  Critical Care performed: No  ____________________________________________   INITIAL IMPRESSION / ASSESSMENT AND PLAN / ED COURSE  Pertinent labs & imaging results that were available during my care of the patient were reviewed by me and considered in my medical decision making (see chart for details).   Dyspnea.  Positional, worsened by laying flat.  Does have a history of congestive heart failure, cardiac disease recent pacemaker implantation.  She does have mild hypoxia at rest especially with laying down and symptomatic.  Has taken herself off of her diuretic.  Appears most consistent with CHF.  Will treat with Lasix, await BNP, chest x-ray shows mild bilateral pleural effusions small in nature.  She has no associated chest pain.  No history of blood clots.  Not complaining of any signs or symptoms suggest DVT or PE.  No evidence of acute ACS, but will add troponin and BNP to her pending labs which are pending at time of admission.  Admission discussed with Dr. Damita Dunnings.  Will admit due to the patient's oxygen requirement, overall she is quite stable with regard to her work of breathing however but I suspect needs diuresis and further work-up, consider cardiology consultation or repeat echo etc.  Further work-up under hospitalist  [place] was evaluated in Emergency Department on 02/25/2020 for the symptoms described in the history of present illness. She was evaluated in the context of the global COVID-19 pandemic, which necessitated consideration that the patient might be at risk for infection with the SARS-CoV-2 virus that causes COVID-19. Institutional protocols and algorithms that pertain to the evaluation of patients at risk for COVID-19 are in a state of rapid change based on information released by regulatory bodies including the CDC and federal and state  organizations. These policies and algorithms were followed during the patient's care in the ED.       ____________________________________________   FINAL CLINICAL IMPRESSION(S) / ED DIAGNOSES  Final diagnoses:  Hypoxia  Volume overload state of heart        Note:  This document was prepared using Dragon voice recognition software and may include unintentional dictation errors       Delman Kitten, MD 02/25/20 [facility] Emergency Department Provider Note   ____________________________________________   First MD Initiated Contact with Patient 02/24/20 2323     (approximate)  I have reviewed the triage vital signs and the nursing notes.   HISTORY  Chief Complaint Shortness of Breath    HPI Amber [name] is a 86 y.o. female here for evaluation of shortness of breath  Patient seen by her physical therapist, told her oxygen level was low and she was feeling short of breath this morning.  Talk to her primary who referred her here.  She reports her oxygen also in the 70-80's range.  She has been noticing shortness of breath especially in the mornings and when she lays down for about the last week  She came to ER for similar concerns about a week ago but due to long wait could not stay to be seen by Dr.  Aurora Mask Dr. Doy Hutching.  Does have a history of congestive heart failure used to take fluid pills but stopped taking them because they make her urinate too frequently.  Continues to take her blood thinner which she reports prescription ran out today for atrial fibrillation  No chest pain.  No fevers or chills.  No nausea or vomiting   Past Medical History:  Diagnosis Date  . Anemia   . Anxiety   . Arthritis   . Benign neoplasm of colon   . CHF (congestive heart failure) ([place])   . Chronic kidney disease   . Chronic pain   . Complication of anesthesia 1980's   hard time waking up  . Coronary artery disease with unspecified  angina pectoris   . Cough   . Diabetes mellitus without complication ([place])   . Essential hypertension   . GERD (gastroesophageal reflux disease)   . High risk medication use   . Hyperlipidemia   . Plantar fascial fibromatosis   . Presence of permanent cardiac pacemaker   . Shortness of breath dyspnea     Patient Active Problem List   Diagnosis Date Noted  . History of atrial fibrillation 02/25/2020  . Pacemaker 02/25/2020  . Acute on chronic diastolic CHF (congestive heart failure) ([place]) 02/25/2020  . Complete heart block ([place]) 01/24/2020  . Injury   . S/P ORIF (open reduction internal fixation) fracture   . Hypotension due to hypovolemia   . Closed displaced fracture of right tibial tuberosity   . Pain   . Syncope and collapse 12/06/2019  . Status post total hip replacement, left 02/09/2018  . Primary osteoarthritis of left hip 01/06/2018  . Chronic pain syndrome 04/09/2017  . Other specified health status 04/08/2017  . Disorder of bone, unspecified 04/08/2017  . Long term current use of opiate analgesic 04/08/2017  . Chronic low back pain (Primary Area of Pain) (Bilateral) (L>R) 04/08/2017  . Chronic pain of both lower extremities  (Tertiary Area of Pain) 04/08/2017  . Chronic groin pain (Secondary Area of Pain) 04/08/2017  . Chronic neck pain (Fourth Area of Pain) (Bilateral) (L>R) 04/08/2017  .  Chronic sacroiliac joint pain 04/08/2017  . Other long term (current) drug therapy 04/08/2017  . Syncope 02/09/2017  . Asthmatic bronchitis 01/26/2017  . Fall 10/06/2016  . Anxiety 08/08/2016  . Acute respiratory failure with hypoxia ([place]) 07/26/2016  . Acute on chronic systolic CHF (congestive heart failure) ([place]) 07/26/2016  . TIA (transient ischemic attack) 07/25/2015  . Sick sinus syndrome ([place]) 03/14/2015  . CAD (coronary artery disease) 02/23/2015  . Chronic heart failure ([place]) 01/05/2015  . Fracture dislocation of ankle 07/27/2014  . IDDM (insulin dependent diabetes  mellitus) 07/27/2014  . Essential hypertension 07/27/2014  . Multiple rib fractures 07/27/2014  . 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 Anemia 12/06/2013  . Type 2 diabetes mellitus with diabetic polyneuropathy, with long-term current use of insulin ([place]) 12/06/2013    Past Surgical History:  Procedure Laterality Date  . ABDOMINAL HYSTERECTOMY    . BACK SURGERY  1960's   cage and screws in lower back  . CATARACT EXTRACTION W/ INTRAOCULAR LENS  IMPLANT, BILATERAL Bilateral   . CORONARY ARTERY BYPASS GRAFT  2008   triple  . HARDWARE REMOVAL Right 12/08/2019   Procedure: HARDWARE REMOVAL;  Surgeon: Hessie Knows, MD;  Location: [facility];  Service: Orthopedics;  Laterality: Right;  . JOINT REPLACEMENT Right    hip and knee  . ORIF ANKLE FRACTURE Right 07/27/2014   Procedure: OPEN REDUCTION INTERNAL FIXATION (ORIF) ANKLE FRACTURE;  Surgeon: Alta Corning, MD;  Location: [place];  Service: Orthopedics;  Laterality: Right;  . ORIF TIBIA FRACTURE Right 12/08/2019   Procedure: OPEN REDUCTION INTERNAL FIXATION (ORIF) TIBIA FRACTURE;  Surgeon: Hessie Knows, MD;  Location: [facility];  Service: Orthopedics;  Laterality: Right;  . PACEMAKER INSERTION Left 03/14/2015   Procedure: INSERTION PACEMAKER;  Surgeon: Isaias Cowman, MD;  Location: [facility];  Service: Cardiovascular;  Laterality: Left;  . PACEMAKER LEADLESS INSERTION N/A 01/24/2020   Procedure: PACEMAKER LEADLESS INSERTION;  Surgeon: Isaias Cowman, MD;  Location: [place] [facility];  Service: Cardiovascular;  Laterality: N/A;  . ROTATOR CUFF REPAIR Left   . TOTAL HIP ARTHROPLASTY Right   . TOTAL HIP ARTHROPLASTY Left 02/09/2018   Procedure: TOTAL HIP ARTHROPLASTY ANTERIOR APPROACH;  Surgeon: Hessie Knows, MD;  Location: [facility];  Service: Orthopedics;  Laterality: Left;  . TOTAL KNEE ARTHROPLASTY Right   . VEIN LIGATION AND STRIPPING      Prior to Admission medications   Medication Sig Start Date End Date Taking? Authorizing Provider   acetaminophen (TYLENOL) 500 MG tablet Take 1,000 mg by mouth every 6 (six) hours as needed for mild pain or headache.     [provider]  albuterol (PROVENTIL HFA;VENTOLIN HFA) 108 (90 Base) MCG/ACT inhaler Inhale 2 puffs into the lungs every 6 (six) hours as needed for wheezing or shortness of breath. 01/28/17   Loletha Grayer, MD  ALPRAZolam Duanne Moron) 1 MG tablet Take 1 tablet (1 mg total) by mouth 2 (two) times daily as needed for anxiety or sleep. 12/12/19   Fritzi Mandes, MD  amLODipine (NORVASC) 10 MG tablet Take 5 mg by mouth daily.     [provider]  apixaban (ELIQUIS) 5 MG TABS tablet Take 5 mg by mouth 2 (two) times daily.    [provider]  calcium-vitamin D (OSCAL WITH D) 500-200 MG-UNIT tablet Take 1 tablet by mouth daily.    [provider]  carvedilol (COREG) 6.25 MG tablet Take 1 tablet (6.25 mg total) by mouth 2 (two) times daily with a meal.  01/28/17   Loletha Grayer, MD  ferrous sulfate 325 (65 FE) MG tablet Take 1 tablet (325 mg total) by mouth 2 (two) times daily with a meal. Patient not taking: Reported on 01/24/2020 12/12/19   Fritzi Mandes, MD  gabapentin (NEURONTIN) 100 MG capsule Take 200 mg by mouth 3 (three) times daily.     [provider]  insulin glargine (LANTUS) 100 UNIT/ML injection Inject 22 Units into the skin daily.    [provider]  levETIRAcetam (KEPPRA) 500 MG tablet Take 500 mg by mouth 2 (two) times daily.    [provider]  Multiple Vitamins-Minerals (PRESERVISION AREDS 2) CAPS Take 1 capsule by mouth daily.    [provider]  omeprazole (PRILOSEC) 40 MG capsule Take 40 mg by mouth daily.     [provider]  pravastatin (PRAVACHOL) 40 MG tablet Take 40 mg by mouth at bedtime.    [provider]    Allergies Ambien [zolpidem tartrate], Penicillins, Iodine, Succinylcholine, Etodolac, and Nsaids  Family History  Problem Relation Age of Onset  . Heart attack Mother    . Heart disease Father   . Alzheimer's disease Sister   . Cervical cancer Sister   . Heart failure Son     Social History Social History   Tobacco Use  . Smoking status: Never Smoker  . Smokeless tobacco: Never Used  Vaping Use  . Vaping Use: Never used  Substance Use Topics  . Alcohol use: No  . Drug use: No    Review of Systems Constitutional: No fever/chills and has been fully vaccinated against Covid Eyes: No visual changes. ENT: No sore throat. Cardiovascular: Denies chest pain. Respiratory: Shortness of breath, mostly when she lays down, relieved somewhat by sitting up and does get short of breath with exertion now for at least the last week or 2 Gastrointestinal: No abdominal pain.   Genitourinary: Negative for dysuria. Musculoskeletal: Negative for back pain. Skin: Negative for rash. Neurological: Negative for headaches, areas of focal weakness or numbness.    ____________________________________________   PHYSICAL EXAM:  VITAL SIGNS: ED Triage Vitals  Enc Vitals Group     BP 02/24/20 1246 (!) 153/83     Pulse Rate 02/24/20 1243 63     Resp 02/24/20 1243 20     Temp 02/24/20 1243 98.2 F (36.8 C)     Temp Source 02/24/20 1243 Oral     SpO2 02/24/20 1242 90 %     Weight 02/24/20 1244 177 lb (80.3 kg)     Height 02/24/20 1244 4\' 11"  (1.499 m)     Head Circumference --      Peak Flow --      Pain Score 02/24/20 1244 0     Pain Loc --      Pain Edu? --      Excl. in [place]? --     Constitutional: Alert and oriented. Well appearing and in no acute distress.  Of note, I turn off her nasal cannula oxygen her oxygen saturation dropped to about 87 to 88% on room air, but cleared quickly returned to the mid 90s on 2 L nasal cannula. Eyes: Conjunctivae are normal. Head: Atraumatic. Nose: No congestion/rhinnorhea. Mouth/Throat: Mucous membranes are moist. Neck: No stridor.  Cardiovascular: Normal rate, regular rhythm. Grossly normal heart sounds.  Good  peripheral circulation. Respiratory: Normal respiratory effort.  No retractions. Lungs CTAB. Gastrointestinal: Soft and nontender. No distention. Musculoskeletal: No lower extremity tenderness nor edema. Neurologic:  Normal speech  and language. No gross focal neurologic deficits are appreciated.  Skin:  Skin is warm, dry and intact. No rash noted. Psychiatric: Mood and affect are normal. Speech and behavior are normal.  ____________________________________________   LABS (all labs ordered are listed, but only abnormal results are displayed)  Labs Reviewed  CBC WITH DIFFERENTIAL/PLATELET - Abnormal; Notable for the following components:      Result Value   RBC 3.83 (*)    Hemoglobin 10.4 (*)    HCT 32.7 (*)    RDW 16.1 (*)    All other components within normal limits  COMPREHENSIVE METABOLIC PANEL - Abnormal; Notable for the following components:   Glucose, Bld 112 (*)    All other components within normal limits  BRAIN NATRIURETIC PEPTIDE - Abnormal; Notable for the following components:   B Natriuretic Peptide 223.3 (*)    All other components within normal limits  SARS CORONAVIRUS 2 BY RT PCR (HOSPITAL ORDER, [place])  GLUCOSE, CAPILLARY  HEMOGLOBIN A1C  TROPONIN I (HIGH SENSITIVITY)   ____________________________________________  EKG  Surgeon reviewed entered by me at 1320 Ventricular pacing.  No evidence of acute ischemia.  QRS 190 QTc 500 Heart rate 75 ____________________________________________  RADIOLOGY  DG Chest 2 View  Result Date: 02/24/2020 CLINICAL DATA:  Onset shortness of breath when the patient woke up this morning. EXAM: CHEST - 2 VIEW COMPARISON:  PA and lateral chest 02/13/2020. FINDINGS: Small bilateral pleural effusions and mild basilar atelectasis are again seen. The lungs are otherwise clear. Cardiomegaly. Pacing device is unchanged. Loop recorder is in place. The patient is status post median sternotomy. No acute or  focal bony abnormality. IMPRESSION: No change in very small bilateral pleural effusions and basilar atelectasis since the most recent exam. Electronically Signed   By: Inge Rise M.D.   On: 02/24/2020 14:29   US Venous Img Lower Bilateral  Result Date: 02/25/2020 CLINICAL DATA:  Bilateral lower extremity edema EXAM: BILATERAL LOWER EXTREMITY VENOUS DOPPLER ULTRASOUND TECHNIQUE: Gray-scale sonography with compression, as well as color and duplex ultrasound, were performed to evaluate the deep venous system(s) from the level of the common femoral vein through the popliteal and proximal calf veins. COMPARISON:  None. FINDINGS: VENOUS Normal compressibility of the common femoral, superficial femoral, and popliteal veins, as well as the visualized calf veins. Visualized portions of profunda femoral vein and great saphenous vein unremarkable. No filling defects to suggest DVT on grayscale or color Doppler imaging. Doppler waveforms show normal direction of venous flow, normal respiratory plasticity and response to augmentation. OTHER None. Limitations: The peroneal veins of the lower extremities bilaterally were not well visualized on this examination. IMPRESSION: Negative. Electronically Signed   By: Fidela Salisbury MD   On: 02/25/2020 01:19    Imaging reviewed, small bilateral pleural effusions and atelectasis. ____________________________________________   PROCEDURES  Procedure(s) performed: None  Procedures  Critical Care performed: No  ____________________________________________   INITIAL IMPRESSION / ASSESSMENT AND PLAN / ED COURSE  Pertinent labs & imaging results that were available during my care of the patient were reviewed by me and considered in my medical decision making (see chart for details).   Dyspnea.  Positional, worsened by laying flat.  Does have a history of congestive heart failure, cardiac disease recent pacemaker implantation.  She does have mild hypoxia at rest  especially with laying down and symptomatic.  Has taken herself off of her diuretic.  Appears most consistent with CHF.  Will treat with Lasix,  await BNP, chest x-ray shows mild bilateral pleural effusions small in nature.  She has no associated chest pain.  No history of blood clots.  Not complaining of any signs or symptoms suggest DVT or PE.  No evidence of acute ACS, but will add troponin and BNP to her pending labs which are pending at time of admission.  Admission discussed with Dr. Damita Dunnings.  Will admit due to the patient's oxygen requirement, overall she is quite stable with regard to her work of breathing however but I suspect needs diuresis and further work-up, consider cardiology consultation or repeat echo etc.  Further work-up under hospitalist  [place] was evaluated in Emergency Department on 02/25/2020 for the symptoms described in the history of present illness. She was evaluated in the context of the global COVID-19 pandemic, which necessitated consideration that the patient might be at risk for infection with the SARS-CoV-2 virus that causes COVID-19. Institutional protocols and algorithms that pertain to the evaluation of patients at risk for COVID-19 are in a state of rapid change based on information released by regulatory bodies including the CDC and federal and state organizations. These policies and algorithms were followed during the patient's care in the ED.       ____________________________________________   FINAL CLINICAL IMPRESSION(S) / ED DIAGNOSES  Final diagnoses:  Hypoxia  Volume overload state of heart        Note:  This document was prepared using Dragon voice recognition software and may include unintentional dictation errors       Delman Kitten, MD 02/25/20 [facility] [place]    Delman Kitten, MD 02/25/20 8127     Allergies Allergies  Allergen Reactions  . Ambien [Zolpidem Tartrate] Other (See Comments)    Reaction:  Keeps pt awake   .  Penicillins Itching and Other (See Comments)    Has patient had a PCN reaction causing immediate rash, facial/tongue/throat swelling, SOB or lightheadedness with hypotension: No Has patient had a PCN reaction causing severe rash involving mucus membranes or skin necrosis: No Has patient had a PCN reaction that required hospitalization No Has patient had a PCN reaction occurring within the last 10 years: No If all of the above answers are "NO", then may proceed with Cephalosporin use.  . Iodine Itching  . Succinylcholine Other (See Comments)    Reaction:  Unknown   . Etodolac Other (See Comments)    GI upset  . Nsaids Other (See Comments)    Reaction: gi upset    Level of Care/Admitting Diagnosis ED Disposition    ED Disposition Condition [place]: [place] [[id number]]  Level of Care: Progressive Cardiac [106]  Admit to Progressive based on following criteria: CARDIOVASCULAR & THORACIC of moderate stability with acute coronary syndrome symptoms/low risk myocardial infarction/hypertensive urgency/arrhythmias/heart failure potentially compromising stability and stable post cardiovascular intervention patients.  Covid Evaluation: Asymptomatic Screening Protocol (No Symptoms)  Diagnosis: Acute CHF (congestive heart failure) [facility]) [[id number]]  Admitting Physician: Athena Masse [[id number]]  Attending Physician: Athena Masse [[id number]]  Estimated length of stay: past midnight tomorrow  Certification:: I certify this patient will need inpatient services for at least 2 midnights       B Medical/Surgery History Past Medical History:  Diagnosis Date  . Anemia   . Anxiety   . Arthritis   . Benign neoplasm of colon   . CHF (congestive heart failure) ([place])   . Chronic kidney disease   . Chronic pain   .  Complication of anesthesia 1980's   hard time waking up  . Coronary artery disease with unspecified angina pectoris   . Cough   . Diabetes  mellitus without complication ([place])   . Essential hypertension   . GERD (gastroesophageal reflux disease)   . High risk medication use   . Hyperlipidemia   . Plantar fascial fibromatosis   . Presence of permanent cardiac pacemaker   . Shortness of breath dyspnea    Past Surgical History:  Procedure Laterality Date  . ABDOMINAL HYSTERECTOMY    . BACK SURGERY  1960's   cage and screws in lower back  . CATARACT EXTRACTION W/ INTRAOCULAR LENS  IMPLANT, BILATERAL Bilateral   . CORONARY ARTERY BYPASS GRAFT  2008   triple  . HARDWARE REMOVAL Right 12/08/2019   Procedure: HARDWARE REMOVAL;  Surgeon: Hessie Knows, MD;  Location: [facility];  Service: Orthopedics;  Laterality: Right;  . JOINT REPLACEMENT Right    hip and knee  . ORIF ANKLE FRACTURE Right 07/27/2014   Procedure: OPEN REDUCTION INTERNAL FIXATION (ORIF) ANKLE FRACTURE;  Surgeon: Alta Corning, MD;  Location: [place];  Service: Orthopedics;  Laterality: Right;  . ORIF TIBIA FRACTURE Right 12/08/2019   Procedure: OPEN REDUCTION INTERNAL FIXATION (ORIF) TIBIA FRACTURE;  Surgeon: Hessie Knows, MD;  Location: [facility];  Service: Orthopedics;  Laterality: Right;  . PACEMAKER INSERTION Left 03/14/2015   Procedure: INSERTION PACEMAKER;  Surgeon: Isaias Cowman, MD;  Location: [facility];  Service: Cardiovascular;  Laterality: Left;  . PACEMAKER LEADLESS INSERTION N/A 01/24/2020   Procedure: PACEMAKER LEADLESS INSERTION;  Surgeon: Isaias Cowman, MD;  Location: [facility];  Service: Cardiovascular;  Laterality: N/A;  . ROTATOR CUFF REPAIR Left   . TOTAL HIP ARTHROPLASTY Right   . TOTAL HIP ARTHROPLASTY Left 02/09/2018   Procedure: TOTAL HIP ARTHROPLASTY ANTERIOR APPROACH;  Surgeon: Hessie Knows, MD;  Location: [facility];  Service: Orthopedics;  Laterality: Left;  . TOTAL KNEE ARTHROPLASTY Right   . VEIN LIGATION AND STRIPPING       A IV Location/Drains/Wounds Patient Lines/Drains/Airways Status    Active Line/Drains/Airways     Name Placement date Placement time Site Days   Peripheral IV 02/13/20 Left Antecubital 02/13/20  1344  Antecubital  12   Peripheral IV 02/24/20 Left Antecubital 02/24/20  2356  Antecubital  1   External Urinary Catheter 02/25/20  0100  --  less than 1          Intake/Output Last 24 hours  Intake/Output Summary (Last 24 hours) at 02/25/2020 2016 Last data filed at 02/25/2020 1943 Gross per 24 hour  Intake --  Output 3100 ml  Net -3100 ml    Labs/Imaging Results for orders placed or performed during the hospital encounter of 02/24/20 (from the past 48 hour(s))  CBC with Differential     Status: Abnormal   Collection Time: 02/24/20 12:46 PM  Result Value Ref Range   WBC 6.2 4.0 - 10.5 K/uL   RBC 3.83 (L) 3.87 - 5.11 MIL/uL   Hemoglobin 10.4 (L) 12.0 - 15.0 g/dL   HCT 32.7 (L) 36 - 46 %   MCV 85.4 80.0 - 100.0 fL   MCH 27.2 26.0 - 34.0 pg   MCHC 31.8 30.0 - 36.0 g/dL   RDW 16.1 (H) 11.5 - 15.5 %   Platelets 289 150 - 400 K/uL   nRBC 0.0 0.0 - 0.2 %   Neutrophils Relative % 65 %   Neutro Abs 4.1 1.7 - 7.7 K/uL  Lymphocytes Relative 19 %   Lymphs Abs 1.2 0.7 - 4.0 K/uL   Monocytes Relative 10 %   Monocytes Absolute 0.6 0 - 1 K/uL   Eosinophils Relative 5 %   Eosinophils Absolute 0.3 0 - 0 K/uL   Basophils Relative 1 %   Basophils Absolute 0.1 0 - 0 K/uL   Immature Granulocytes 0 %   Abs Immature Granulocytes 0.02 0.00 - 0.07 K/uL    Comment: Performed at [facility], [address], [place], [place] [address]  Comprehensive metabolic panel     Status: Abnormal   Collection Time: 02/24/20 12:46 PM  Result Value Ref Range   Sodium 139 135 - 145 mmol/L   Potassium 3.8 3.5 - 5.1 mmol/L   Chloride 101 98 - 111 mmol/L   CO2 29 22 - 32 mmol/L   Glucose, Bld 112 (H) 70 - 99 mg/dL    Comment: Glucose reference range applies only to samples taken after fasting for at least 8 hours.   BUN 17 8 - 23 mg/dL   Creatinine, Ser 0.73 0.44 - 1.00 mg/dL   Calcium 9.5 8.9  - 10.3 mg/dL   Total Protein 7.5 6.5 - 8.1 g/dL   Albumin 3.9 3.5 - 5.0 g/dL   AST 17 15 - 41 U/L   ALT 11 0 - 44 U/L   Alkaline Phosphatase 78 38 - 126 U/L   Total Bilirubin 0.8 0.3 - 1.2 mg/dL   GFR calc non Af Amer >60 >60 mL/min   GFR calc Af Amer >60 >60 mL/min   Anion gap 9 5 - 15    Comment: Performed at [facility], [place], [place] [address]  Troponin I (High Sensitivity)     Status: None   Collection Time: 02/24/20 11:30 PM  Result Value Ref Range   Troponin I (High Sensitivity) 5 <18 ng/L    Comment: (NOTE) Elevated high sensitivity troponin I (hsTnI) values and significant  changes across serial measurements may suggest ACS but many other  chronic and acute conditions are known to elevate hsTnI results.  Refer to the "Links" section for chest pain algorithms and additional  guidance. Performed at [facility], [address] [place], [place] [address]   Brain natriuretic peptide     Status: Abnormal   Collection Time: 02/24/20 11:31 PM  Result Value Ref Range   B Natriuretic Peptide 223.3 (H) 0.0 - 100.0 pg/mL    Comment: Performed at [facility], [address] [place], [place] [address]  SARS Coronavirus 2 by RT PCR (hospital order, performed in [facility] hospital lab) Nasopharyngeal Nasopharyngeal Swab     Status: None   Collection Time: 02/24/20 11:35 PM   Specimen: Nasopharyngeal Swab  Result Value Ref Range   SARS Coronavirus 2 NEGATIVE NEGATIVE    Comment: (NOTE) SARS-CoV-2 target nucleic acids are NOT DETECTED.  The SARS-CoV-2 RNA is generally detectable in upper and lower respiratory specimens during the acute phase of infection. The lowest concentration of SARS-CoV-2 viral copies this assay can detect is 250 copies / mL. A negative result does not preclude SARS-CoV-2 infection and should not be used as the sole basis for treatment or other patient management decisions.  A negative result may occur  with improper specimen collection / handling, submission of specimen other than nasopharyngeal swab, presence of viral mutation(s) within the areas targeted by this assay, and inadequate number of viral copies (<250 copies / mL). A negative result  must be combined with clinical observations, patient history, and epidemiological information.  Fact Sheet for Patients:   StrictlyIdeas.no  Fact Sheet for Healthcare Providers: BankingDealers.co.za  This test is not yet approved or  cleared by the [place] FDA and has been authorized for detection and/or diagnosis of SARS-CoV-2 by FDA under an Emergency Use Authorization (EUA).  This EUA will remain in effect (meaning this test can be used) for the duration of the COVID-19 declaration under Section 564(b)(1) of the Act, 21 U.S.C. section 360bbb-3(b)(1), unless the authorization is terminated or revoked sooner.  Performed at [facility], [address] [place], [place] [address]   Glucose, capillary     Status: None   Collection Time: 02/25/20  7:36 AM  Result Value Ref Range   Glucose-Capillary 71 70 - 99 mg/dL    Comment: Glucose reference range applies only to samples taken after fasting for at least 8 hours.  Glucose, capillary     Status: Abnormal   Collection Time: 02/25/20 11:27 AM  Result Value Ref Range   Glucose-Capillary 142 (H) 70 - 99 mg/dL    Comment: Glucose reference range applies only to samples taken after fasting for at least 8 hours.  Glucose, capillary     Status: Abnormal   Collection Time: 02/25/20  4:52 PM  Result Value Ref Range   Glucose-Capillary 149 (H) 70 - 99 mg/dL    Comment: Glucose reference range applies only to samples taken after fasting for at least 8 hours.   DG Chest 2 View  Result Date: 02/24/2020 CLINICAL DATA:  Onset shortness of breath when the patient woke up this morning. EXAM: CHEST - 2 VIEW COMPARISON:  PA and lateral chest  02/13/2020. FINDINGS: Small bilateral pleural effusions and mild basilar atelectasis are again seen. The lungs are otherwise clear. Cardiomegaly. Pacing device is unchanged. Loop recorder is in place. The patient is status post median sternotomy. No acute or focal bony abnormality. IMPRESSION: No change in very small bilateral pleural effusions and basilar atelectasis since the most recent exam. Electronically Signed   By: Inge Rise M.D.   On: 02/24/2020 14:29   US Venous Img Lower Bilateral  Result Date: 02/25/2020 CLINICAL DATA:  Bilateral lower extremity edema EXAM: BILATERAL LOWER EXTREMITY VENOUS DOPPLER ULTRASOUND TECHNIQUE: Gray-scale sonography with compression, as well as color and duplex ultrasound, were performed to evaluate the deep venous system(s) from the level of the common femoral vein through the popliteal and proximal calf veins. COMPARISON:  None. FINDINGS: VENOUS Normal compressibility of the common femoral, superficial femoral, and popliteal veins, as well as the visualized calf veins. Visualized portions of profunda femoral vein and great saphenous vein unremarkable. No filling defects to suggest DVT on grayscale or color Doppler imaging. Doppler waveforms show normal direction of venous flow, normal respiratory plasticity and response to augmentation. OTHER None. Limitations: The peroneal veins of the lower extremities bilaterally were not well visualized on this examination. IMPRESSION: Negative. Electronically Signed   By: Fidela Salisbury MD   On: 02/25/2020 01:19    Pending Labs Unresulted Labs (From admission, onward)          Start     Ordered   02/26/20 0500  CBC  Tomorrow morning,   STAT        02/25/20 1931   02/26/20 0500  Comprehensive metabolic panel  Tomorrow morning,   STAT        02/25/20 1931   02/26/20 0500  Magnesium  Tomorrow morning,  STAT        02/25/20 1931   02/26/20 0500  Phosphorus  Tomorrow morning,   STAT        02/25/20 1931   02/25/20  1932  T4, free  Once,   STAT        02/25/20 1931   02/25/20 1932  TSH  Once,   STAT        02/25/20 1931   02/25/20 0147  Hemoglobin A1c  Once,   STAT       Comments: To assess prior glycemic control    02/25/20 0150          Vitals/Pain Today's Vitals   02/25/20 1900 02/25/20 1930 02/25/20 1942 02/25/20 1944  BP: 109/65 (!) 160/81    Pulse: 60 (!) 59    Resp: 20 14    Temp:    98.1 F (36.7 C)  TempSrc:    Oral  SpO2: 96% 94%    Weight:      Height:      PainSc:   0-No pain     Isolation Precautions No active isolations  Medications Medications  apixaban (ELIQUIS) tablet 5 mg (5 mg Oral Given 02/25/20 0946)  carvedilol (COREG) tablet 6.25 mg (6.25 mg Oral Given 02/25/20 1654)  sodium chloride flush (NS) 0.9 % injection 3 mL (3 mLs Intravenous Not Given 02/25/20 0919)  sodium chloride flush (NS) 0.9 % injection 3 mL (has no administration in time range)  0.9 %  sodium chloride infusion (has no administration in time range)  acetaminophen (TYLENOL) tablet 650 mg (has no administration in time range)  ondansetron (ZOFRAN) injection 4 mg (has no administration in time range)  insulin aspart (novoLOG) injection 0-15 Units (2 Units Subcutaneous Given 02/25/20 1707)  insulin aspart (novoLOG) injection 0-5 Units (0 Units Subcutaneous Not Given 02/25/20 0420)  labetalol (NORMODYNE) injection 10 mg (10 mg Intravenous Given 02/25/20 0422)  lisinopril (ZESTRIL) tablet 10 mg (10 mg Oral Given 02/25/20 1047)  furosemide (LASIX) tablet 40 mg (40 mg Oral Given 02/25/20 1047)  furosemide (LASIX) injection 40 mg (40 mg Intravenous Given 02/24/20 2357)    Mobility non-ambulatory High fall risk   Focused Assessments Pulmonary Assessment Handoff:  Lung sounds:   O2 Device: Nasal Cannula O2 Flow Rate (L/min): 0.5 L/min      R Recommendations: See Admitting Provider Note  Report given to:   Additional Notes: Alert and confused (oriented to self and situation) on 2L oxygen. Just  had an episode of stool incontinence but can normally call and tell you she has to defecate. Have not ambulated her since I've been here. Vital signs stable, no reports of pain.

## 2020-02-25 NOTE — ED Notes (Signed)
U/s at bedside

## 2020-02-25 NOTE — ED Notes (Signed)
Report to angela, rn. 

## 2020-02-25 NOTE — ED Notes (Signed)
Peri care performed. Breakfast tray given to pt.

## 2020-02-25 NOTE — ED Notes (Signed)
Asked nancy in pharmacy to retime eliquis for around 1000 to match times taken at home.

## 2020-02-25 NOTE — ED Notes (Signed)
Bed changed, purewick changed, peri care performed.

## 2020-02-25 NOTE — ED Triage Notes (Signed)
[facility] Emergency Department Provider Note   ____________________________________________   First MD Initiated Contact with Patient 02/24/20 2323     (approximate)  I have reviewed the triage vital signs and the nursing notes.   HISTORY  Chief Complaint Shortness of Breath    HPI Amber [name] is a 86 y.o. female here for evaluation of shortness of breath  Patient seen by her physical therapist, told her oxygen level was low and she was feeling short of breath this morning.  Talk to her primary who referred her here.  She reports her oxygen also in the 70-80's range.  She has been noticing shortness of breath especially in the mornings and when she lays down for about the last week  She came to ER for similar concerns about a week ago but due to long wait could not stay to be seen by Dr.  Aurora Mask Dr. Doy Hutching.  Does have a history of congestive heart failure used to take fluid pills but stopped taking them because they make her urinate too frequently.  Continues to take her blood thinner which she reports prescription ran out today for atrial fibrillation  No chest pain.  No fevers or chills.  No nausea or vomiting   Past Medical History:  Diagnosis Date  . Anemia   . Anxiety   . Arthritis   . Benign neoplasm of colon   . CHF (congestive heart failure) ([place])   . Chronic kidney disease   . Chronic pain   . Complication of anesthesia 1980's   hard time waking up  . Coronary artery disease with unspecified angina pectoris   . Cough   . Diabetes mellitus without complication ([place])   . Essential hypertension   . GERD (gastroesophageal reflux disease)   . High risk medication use   . Hyperlipidemia   . Plantar fascial fibromatosis   . Presence of permanent cardiac pacemaker   . Shortness of breath dyspnea     Patient Active Problem List   Diagnosis Date Noted  . Acute CHF (congestive heart failure) ([place]) 02/24/2020  . Complete heart  block ([place]) 01/24/2020  . Injury   . S/P ORIF (open reduction internal fixation) fracture   . Hypotension due to hypovolemia   . Closed displaced fracture of right tibial tuberosity   . Pain   . Syncope and collapse 12/06/2019  . Status post total hip replacement, left 02/09/2018  . Primary osteoarthritis of left hip 01/06/2018  . Chronic pain syndrome 04/09/2017  . Other specified health status 04/08/2017  . Disorder of bone, unspecified 04/08/2017  . Long term current use of opiate analgesic 04/08/2017  . Chronic low back pain (Primary Area of Pain) (Bilateral) (L>R) 04/08/2017  . Chronic pain of both lower extremities  (Tertiary Area of Pain) 04/08/2017  . Chronic groin pain (Secondary Area of Pain) 04/08/2017  . Chronic neck pain (Fourth Area of Pain) (Bilateral) (L>R) 04/08/2017  . Chronic sacroiliac joint pain 04/08/2017  . Other long term (current) drug therapy 04/08/2017  . Syncope 02/09/2017  . Asthmatic bronchitis 01/26/2017  . Fall 10/06/2016  . Anxiety 08/08/2016  . TIA (transient ischemic attack) 07/25/2015  . Sick sinus syndrome ([place]) 03/14/2015  . Chronic heart failure ([place]) 01/05/2015  . Fracture dislocation of ankle 07/27/2014  . IDDM (insulin dependent diabetes mellitus) 07/27/2014  . Essential hypertension 07/27/2014  . Multiple rib fractures 07/27/2014  . 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 Anemia 12/06/2013  Past Surgical History:  Procedure Laterality Date  . ABDOMINAL HYSTERECTOMY    . BACK SURGERY  1960's   cage and screws in lower back  . CATARACT EXTRACTION W/ INTRAOCULAR LENS  IMPLANT, BILATERAL Bilateral   . CORONARY ARTERY BYPASS GRAFT  2008   triple  . HARDWARE REMOVAL Right 12/08/2019   Procedure: HARDWARE REMOVAL;  Surgeon: Hessie Knows, MD;  Location: [facility] OR[facility];  Service: Orthopedics;  Laterality: Right;  . JOINT REPLACEMENT Right    hip and knee  . ORIF ANKLE FRACTURE Right 07/27/2014   Procedure: OPEN REDUCTION INTERNAL FIXATION (ORIF)  ANKLE FRACTURE;  Surgeon: Alta Corning, MD;  Location: [place];  Service: Orthopedics;  Laterality: Right;  . ORIF TIBIA FRACTURE Right 12/08/2019   Procedure: OPEN REDUCTION INTERNAL FIXATION (ORIF) TIBIA FRACTURE;  Surgeon: Hessie Knows, MD;  Location: [facility];  Service: Orthopedics;  Laterality: Right;  . PACEMAKER INSERTION Left 03/14/2015   Procedure: INSERTION PACEMAKER;  Surgeon: Isaias Cowman, MD;  Location: [facility];  Service: Cardiovascular;  Laterality: Left;  . PACEMAKER LEADLESS INSERTION N/A 01/24/2020   Procedure: PACEMAKER LEADLESS INSERTION;  Surgeon: Isaias Cowman, MD;  Location: [place] [facility];  Service: Cardiovascular;  Laterality: N/A;  . ROTATOR CUFF REPAIR Left   . TOTAL HIP ARTHROPLASTY Right   . TOTAL HIP ARTHROPLASTY Left 02/09/2018   Procedure: TOTAL HIP ARTHROPLASTY ANTERIOR APPROACH;  Surgeon: Hessie Knows, MD;  Location: [facility];  Service: Orthopedics;  Laterality: Left;  . TOTAL KNEE ARTHROPLASTY Right   . VEIN LIGATION AND STRIPPING      Prior to Admission medications   Medication Sig Start Date End Date Taking? Authorizing Provider  acetaminophen (TYLENOL) 500 MG tablet Take 1,000 mg by mouth every 6 (six) hours as needed for mild pain or headache.     [provider]  albuterol (PROVENTIL HFA;VENTOLIN HFA) 108 (90 Base) MCG/ACT inhaler Inhale 2 puffs into the lungs every 6 (six) hours as needed for wheezing or shortness of breath. 01/28/17   Loletha Grayer, MD  ALPRAZolam Duanne Moron) 1 MG tablet Take 1 tablet (1 mg total) by mouth 2 (two) times daily as needed for anxiety or sleep. 12/12/19   Fritzi Mandes, MD  amLODipine (NORVASC) 10 MG tablet Take 5 mg by mouth daily.     [provider]  apixaban (ELIQUIS) 5 MG TABS tablet Take 5 mg by mouth 2 (two) times daily.    [provider]  calcium-vitamin D (OSCAL WITH D) 500-200 MG-UNIT tablet Take 1 tablet by mouth daily.    [provider]  carvedilol (COREG) 6.25 MG  tablet Take 1 tablet (6.25 mg total) by mouth 2 (two) times daily with a meal. 01/28/17   Loletha Grayer, MD  ferrous sulfate 325 (65 FE) MG tablet Take 1 tablet (325 mg total) by mouth 2 (two) times daily with a meal. Patient not taking: Reported on 01/24/2020 12/12/19   Fritzi Mandes, MD  gabapentin (NEURONTIN) 100 MG capsule Take 200 mg by mouth 3 (three) times daily.     [provider]  insulin glargine (LANTUS) 100 UNIT/ML injection Inject 22 Units into the skin daily.    [provider]  levETIRAcetam (KEPPRA) 500 MG tablet Take 500 mg by mouth 2 (two) times daily.    [provider]  Multiple Vitamins-Minerals (PRESERVISION AREDS 2) CAPS Take 1 capsule by mouth daily.    [provider]  omeprazole (PRILOSEC) 40 MG capsule Take 40 mg by mouth daily.  [provider]  pravastatin (PRAVACHOL) 40 MG tablet Take 40 mg by mouth at bedtime.    [provider]    Allergies Ambien [zolpidem tartrate], Penicillins, Iodine, Succinylcholine, Etodolac, and Nsaids  Family History  Problem Relation Age of Onset  . Heart attack Mother   . Heart disease Father   . Alzheimer's disease Sister   . Cervical cancer Sister   . Heart failure Son     Social History Social History   Tobacco Use  . Smoking status: Never Smoker  . Smokeless tobacco: Never Used  Vaping Use  . Vaping Use: Never used  Substance Use Topics  . Alcohol use: No  . Drug use: No    Review of Systems Constitutional: No fever/chills and has been fully vaccinated against Covid Eyes: No visual changes. ENT: No sore throat. Cardiovascular: Denies chest pain. Respiratory: Shortness of breath, mostly when she lays down, relieved somewhat by sitting up and does get short of breath with exertion now for at least the last week or 2 Gastrointestinal: No abdominal pain.   Genitourinary: Negative for dysuria. Musculoskeletal: Negative for back pain. Skin: Negative for  rash. Neurological: Negative for headaches, areas of focal weakness or numbness.    ____________________________________________   PHYSICAL EXAM:  VITAL SIGNS: ED Triage Vitals  Enc Vitals Group     BP 02/24/20 1246 (!) 153/83     Pulse Rate 02/24/20 1243 63     Resp 02/24/20 1243 20     Temp 02/24/20 1243 98.2 F (36.8 C)     Temp Source 02/24/20 1243 Oral     SpO2 02/24/20 1242 90 %     Weight 02/24/20 1244 177 lb (80.3 kg)     Height 02/24/20 1244 4\' 11"  (1.499 m)     Head Circumference --      Peak Flow --      Pain Score 02/24/20 1244 0     Pain Loc --      Pain Edu? --      Excl. in [place]? --     Constitutional: Alert and oriented. Well appearing and in no acute distress.  Of note, I turn off her nasal cannula oxygen her oxygen saturation dropped to about 87 to 88% on room air, but cleared quickly returned to the mid 90s on 2 L nasal cannula. Eyes: Conjunctivae are normal. Head: Atraumatic. Nose: No congestion/rhinnorhea. Mouth/Throat: Mucous membranes are moist. Neck: No stridor.  Cardiovascular: Normal rate, regular rhythm. Grossly normal heart sounds.  Good peripheral circulation. Respiratory: Normal respiratory effort.  No retractions. Lungs CTAB. Gastrointestinal: Soft and nontender. No distention. Musculoskeletal: No lower extremity tenderness nor edema. Neurologic:  Normal speech and language. No gross focal neurologic deficits are appreciated.  Skin:  Skin is warm, dry and intact. No rash noted. Psychiatric: Mood and affect are normal. Speech and behavior are normal.  ____________________________________________   LABS (all labs ordered are listed, but only abnormal results are displayed)  Labs Reviewed  CBC WITH DIFFERENTIAL/PLATELET - Abnormal; Notable for the following components:      Result Value   RBC 3.83 (*)    Hemoglobin 10.4 (*)    HCT 32.7 (*)    RDW 16.1 (*)    All other components within normal limits  COMPREHENSIVE METABOLIC PANEL -  Abnormal; Notable for the following components:   Glucose, Bld 112 (*)    All other components within normal limits  SARS CORONAVIRUS 2 BY RT PCR (HOSPITAL ORDER, PERFORMED IN [facility])  BRAIN NATRIURETIC PEPTIDE  TROPONIN I (HIGH SENSITIVITY)   ____________________________________________  EKG  Surgeon reviewed entered by me at 1320 Ventricular pacing.  No evidence of acute ischemia.  QRS 190 QTc 500 Heart rate 75 ____________________________________________  RADIOLOGY  DG Chest 2 View  Result Date: 02/24/2020 CLINICAL DATA:  Onset shortness of breath when the patient woke up this morning. EXAM: CHEST - 2 VIEW COMPARISON:  PA and lateral chest 02/13/2020. FINDINGS: Small bilateral pleural effusions and mild basilar atelectasis are again seen. The lungs are otherwise clear. Cardiomegaly. Pacing device is unchanged. Loop recorder is in place. The patient is status post median sternotomy. No acute or focal bony abnormality. IMPRESSION: No change in very small bilateral pleural effusions and basilar atelectasis since the most recent exam. Electronically Signed   By: Inge Rise M.D.   On: 02/24/2020 14:29    Imaging reviewed, small bilateral pleural effusions and atelectasis. ____________________________________________   PROCEDURES  Procedure(s) performed: None  Procedures  Critical Care performed: No  ____________________________________________   INITIAL IMPRESSION / ASSESSMENT AND PLAN / ED COURSE  Pertinent labs & imaging results that were available during my care of the patient were reviewed by me and considered in my medical decision making (see chart for details).   Dyspnea.  Positional, worsened by laying flat.  Does have a history of congestive heart failure, cardiac disease recent pacemaker implantation.  She does have mild hypoxia at rest especially with laying down and symptomatic.  Has taken herself off of her diuretic.  Appears most  consistent with CHF.  Will treat with Lasix, await BNP, chest x-ray shows mild bilateral pleural effusions small in nature.  She has no associated chest pain.  No history of blood clots.  Not complaining of any signs or symptoms suggest DVT or PE.  No evidence of acute ACS, but will add troponin and BNP to her pending labs which are pending at time of admission.  Admission discussed with Dr. Damita Dunnings.  Will admit due to the patient's oxygen requirement, overall she is quite stable with regard to her work of breathing however but I suspect needs diuresis and further work-up, consider cardiology consultation or repeat echo etc.  Further work-up under hospitalist  Clay was evaluated in Emergency Department on 02/25/2020 for the symptoms described in the history of present illness. She was evaluated in the context of the global COVID-19 pandemic, which necessitated consideration that the patient might be at risk for infection with the SARS-CoV-2 virus that causes COVID-19. Institutional protocols and algorithms that pertain to the evaluation of patients at risk for COVID-19 are in a state of rapid change based on information released by regulatory bodies including the CDC and federal and state organizations. These policies and algorithms were followed during the patient's care in the ED.       ____________________________________________   FINAL CLINICAL IMPRESSION(S) / ED DIAGNOSES  Final diagnoses:  Hypoxia  Volume overload state of heart        Note:  This document was prepared using Dragon voice recognition software and may include unintentional dictation errors       Delman Kitten, MD 02/25/20 0031

## 2020-02-25 NOTE — ED Provider Notes (Signed)
[facility] Emergency Department Provider Note   ____________________________________________   First MD Initiated Contact with Patient 02/24/20 2323     (approximate)  I have reviewed the triage vital signs and the nursing notes.   HISTORY  Chief Complaint Shortness of Breath    HPI Amber [name] is a 86 y.o. female here for evaluation of shortness of breath  Patient seen by her physical therapist, told her oxygen level was low and she was feeling short of breath this morning.  Talk to her primary who referred her here.  She reports her oxygen also in the 70-80's range.  She has been noticing shortness of breath especially in the mornings and when she lays down for about the last week  She came to ER for similar concerns about a week ago but due to long wait could not stay to be seen by Dr.  Aurora Mask Dr. Doy Hutching.  Does have a history of congestive heart failure used to take fluid pills but stopped taking them because they make her urinate too frequently.  Continues to take her blood thinner which she reports prescription ran out today for atrial fibrillation  No chest pain.  No fevers or chills.  No nausea or vomiting   Past Medical History:  Diagnosis Date  . Anemia   . Anxiety   . Arthritis   . Benign neoplasm of colon   . CHF (congestive heart failure) ([place])   . Chronic kidney disease   . Chronic pain   . Complication of anesthesia 1980's   hard time waking up  . Coronary artery disease with unspecified angina pectoris   . Cough   . Diabetes mellitus without complication ([place])   . Essential hypertension   . GERD (gastroesophageal reflux disease)   . High risk medication use   . Hyperlipidemia   . Plantar fascial fibromatosis   . Presence of permanent cardiac pacemaker   . Shortness of breath dyspnea     Patient Active Problem List   Diagnosis Date Noted  . History of atrial fibrillation 02/25/2020  . Pacemaker 02/25/2020  . Acute  on chronic diastolic CHF (congestive heart failure) ([place]) 02/25/2020  . Complete heart block ([place]) 01/24/2020  . Injury   . S/P ORIF (open reduction internal fixation) fracture   . Hypotension due to hypovolemia   . Closed displaced fracture of right tibial tuberosity   . Pain   . Syncope and collapse 12/06/2019  . Status post total hip replacement, left 02/09/2018  . Primary osteoarthritis of left hip 01/06/2018  . Chronic pain syndrome 04/09/2017  . Other specified health status 04/08/2017  . Disorder of bone, unspecified 04/08/2017  . Long term current use of opiate analgesic 04/08/2017  . Chronic low back pain (Primary Area of Pain) (Bilateral) (L>R) 04/08/2017  . Chronic pain of both lower extremities  (Tertiary Area of Pain) 04/08/2017  . Chronic groin pain (Secondary Area of Pain) 04/08/2017  . Chronic neck pain (Fourth Area of Pain) (Bilateral) (L>R) 04/08/2017  . Chronic sacroiliac joint pain 04/08/2017  . Other long term (current) drug therapy 04/08/2017  . Syncope 02/09/2017  . Asthmatic bronchitis 01/26/2017  . Fall 10/06/2016  . Anxiety 08/08/2016  . Acute respiratory failure with hypoxia ([place]) 07/26/2016  . Acute on chronic systolic CHF (congestive heart failure) ([place]) 07/26/2016  . TIA (transient ischemic attack) 07/25/2015  . Sick sinus syndrome ([place]) 03/14/2015  . CAD (coronary artery disease) 02/23/2015  . Chronic heart failure (  [place]) 01/05/2015  . Fracture dislocation of ankle 07/27/2014  . IDDM (insulin dependent diabetes mellitus) 07/27/2014  . Essential hypertension 07/27/2014  . Multiple rib fractures 07/27/2014  . 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 Anemia 12/06/2013  . Type 2 diabetes mellitus with diabetic polyneuropathy, with long-term current use of insulin ([place]) 12/06/2013    Past Surgical History:  Procedure Laterality Date  . ABDOMINAL HYSTERECTOMY    . BACK SURGERY  1960's   cage and screws in lower back  . CATARACT EXTRACTION W/ INTRAOCULAR  LENS  IMPLANT, BILATERAL Bilateral   . CORONARY ARTERY BYPASS GRAFT  2008   triple  . HARDWARE REMOVAL Right 12/08/2019   Procedure: HARDWARE REMOVAL;  Surgeon: Hessie Knows, MD;  Location: [facility];  Service: Orthopedics;  Laterality: Right;  . JOINT REPLACEMENT Right    hip and knee  . ORIF ANKLE FRACTURE Right 07/27/2014   Procedure: OPEN REDUCTION INTERNAL FIXATION (ORIF) ANKLE FRACTURE;  Surgeon: Alta Corning, MD;  Location: [facility];  Service: Orthopedics;  Laterality: Right;  . ORIF TIBIA FRACTURE Right 12/08/2019   Procedure: OPEN REDUCTION INTERNAL FIXATION (ORIF) TIBIA FRACTURE;  Surgeon: Hessie Knows, MD;  Location: [facility];  Service: Orthopedics;  Laterality: Right;  . PACEMAKER INSERTION Left 03/14/2015   Procedure: INSERTION PACEMAKER;  Surgeon: Isaias Cowman, MD;  Location: [facility];  Service: Cardiovascular;  Laterality: Left;  . PACEMAKER LEADLESS INSERTION N/A 01/24/2020   Procedure: PACEMAKER LEADLESS INSERTION;  Surgeon: Isaias Cowman, MD;  Location: [place] [facility];  Service: Cardiovascular;  Laterality: N/A;  . ROTATOR CUFF REPAIR Left   . TOTAL HIP ARTHROPLASTY Right   . TOTAL HIP ARTHROPLASTY Left 02/09/2018   Procedure: TOTAL HIP ARTHROPLASTY ANTERIOR APPROACH;  Surgeon: Hessie Knows, MD;  Location: [facility];  Service: Orthopedics;  Laterality: Left;  . TOTAL KNEE ARTHROPLASTY Right   . VEIN LIGATION AND STRIPPING      Prior to Admission medications   Medication Sig Start Date End Date Taking? Authorizing Provider  acetaminophen (TYLENOL) 500 MG tablet Take 1,000 mg by mouth every 6 (six) hours as needed for mild pain or headache.     [provider]  albuterol (PROVENTIL HFA;VENTOLIN HFA) 108 (90 Base) MCG/ACT inhaler Inhale 2 puffs into the lungs every 6 (six) hours as needed for wheezing or shortness of breath. 01/28/17   Loletha Grayer, MD  ALPRAZolam Duanne Moron) 1 MG tablet Take 1 tablet (1 mg total) by mouth 2 (two) times daily as needed for  anxiety or sleep. 12/12/19   Fritzi Mandes, MD  amLODipine (NORVASC) 10 MG tablet Take 5 mg by mouth daily.     [provider]  apixaban (ELIQUIS) 5 MG TABS tablet Take 5 mg by mouth 2 (two) times daily.    [provider]  calcium-vitamin D (OSCAL WITH D) 500-200 MG-UNIT tablet Take 1 tablet by mouth daily.    [provider]  carvedilol (COREG) 6.25 MG tablet Take 1 tablet (6.25 mg total) by mouth 2 (two) times daily with a meal. 01/28/17   Loletha Grayer, MD  ferrous sulfate 325 (65 FE) MG tablet Take 1 tablet (325 mg total) by mouth 2 (two) times daily with a meal. Patient not taking: Reported on 01/24/2020 12/12/19   Fritzi Mandes, MD  gabapentin (NEURONTIN) 100 MG capsule Take 200 mg by mouth 3 (three) times daily.     [provider]  insulin glargine (LANTUS) 100 UNIT/ML injection Inject 22 Units into the skin daily.  [provider]  levETIRAcetam (KEPPRA) 500 MG tablet Take 500 mg by mouth 2 (two) times daily.    [provider]  Multiple Vitamins-Minerals (PRESERVISION AREDS 2) CAPS Take 1 capsule by mouth daily.    [provider]  omeprazole (PRILOSEC) 40 MG capsule Take 40 mg by mouth daily.     [provider]  pravastatin (PRAVACHOL) 40 MG tablet Take 40 mg by mouth at bedtime.    [provider]    Allergies Ambien [zolpidem tartrate], Penicillins, Iodine, Succinylcholine, Etodolac, and Nsaids  Family History  Problem Relation Age of Onset  . Heart attack Mother   . Heart disease Father   . Alzheimer's disease Sister   . Cervical cancer Sister   . Heart failure Son     Social History Social History   Tobacco Use  . Smoking status: Never Smoker  . Smokeless tobacco: Never Used  Vaping Use  . Vaping Use: Never used  Substance Use Topics  . Alcohol use: No  . Drug use: No    Review of Systems Constitutional: No fever/chills and has been fully vaccinated against Covid Eyes: No visual  changes. ENT: No sore throat. Cardiovascular: Denies chest pain. Respiratory: Shortness of breath, mostly when she lays down, relieved somewhat by sitting up and does get short of breath with exertion now for at least the last week or 2 Gastrointestinal: No abdominal pain.   Genitourinary: Negative for dysuria. Musculoskeletal: Negative for back pain. Skin: Negative for rash. Neurological: Negative for headaches, areas of focal weakness or numbness.    ____________________________________________   PHYSICAL EXAM:  VITAL SIGNS: ED Triage Vitals  Enc Vitals Group     BP 02/24/20 1246 (!) 153/83     Pulse Rate 02/24/20 1243 63     Resp 02/24/20 1243 20     Temp 02/24/20 1243 98.2 F (36.8 C)     Temp Source 02/24/20 1243 Oral     SpO2 02/24/20 1242 90 %     Weight 02/24/20 1244 177 lb (80.3 kg)     Height 02/24/20 1244 4\' 11"  (1.499 m)     Head Circumference --      Peak Flow --      Pain Score 02/24/20 1244 0     Pain Loc --      Pain Edu? --      Excl. in [place]? --     Constitutional: Alert and oriented. Well appearing and in no acute distress.  Of note, I turn off her nasal cannula oxygen her oxygen saturation dropped to about 87 to 88% on room air, but cleared quickly returned to the mid 90s on 2 L nasal cannula. Eyes: Conjunctivae are normal. Head: Atraumatic. Nose: No congestion/rhinnorhea. Mouth/Throat: Mucous membranes are moist. Neck: No stridor.  Cardiovascular: Normal rate, regular rhythm. Grossly normal heart sounds.  Good peripheral circulation. Respiratory: Normal respiratory effort.  No retractions. Lungs CTAB. Gastrointestinal: Soft and nontender. No distention. Musculoskeletal: No lower extremity tenderness nor edema. Neurologic:  Normal speech and language. No gross focal neurologic deficits are appreciated.  Skin:  Skin is warm, dry and intact. No rash noted. Psychiatric: Mood and affect are normal. Speech and behavior are  normal.  ____________________________________________   LABS (all labs ordered are listed, but only abnormal results are displayed)  Labs Reviewed  CBC WITH DIFFERENTIAL/PLATELET - Abnormal; Notable for the following components:      Result Value   RBC 3.83 (*)    Hemoglobin 10.4 (*)  HCT 32.7 (*)    RDW 16.1 (*)    All other components within normal limits  COMPREHENSIVE METABOLIC PANEL - Abnormal; Notable for the following components:   Glucose, Bld 112 (*)    All other components within normal limits  BRAIN NATRIURETIC PEPTIDE - Abnormal; Notable for the following components:   B Natriuretic Peptide 223.3 (*)    All other components within normal limits  SARS CORONAVIRUS 2 BY RT PCR (HOSPITAL ORDER, [facility])  GLUCOSE, CAPILLARY  HEMOGLOBIN A1C  TROPONIN I (HIGH SENSITIVITY)   ____________________________________________  EKG  Surgeon reviewed entered by me at 1320 Ventricular pacing.  No evidence of acute ischemia.  QRS 190 QTc 500 Heart rate 75 ____________________________________________  RADIOLOGY  DG Chest 2 View  Result Date: 02/24/2020 CLINICAL DATA:  Onset shortness of breath when the patient woke up this morning. EXAM: CHEST - 2 VIEW COMPARISON:  PA and lateral chest 02/13/2020. FINDINGS: Small bilateral pleural effusions and mild basilar atelectasis are again seen. The lungs are otherwise clear. Cardiomegaly. Pacing device is unchanged. Loop recorder is in place. The patient is status post median sternotomy. No acute or focal bony abnormality. IMPRESSION: No change in very small bilateral pleural effusions and basilar atelectasis since the most recent exam. Electronically Signed   By: Inge Rise M.D.   On: 02/24/2020 14:29   US Venous Img Lower Bilateral  Result Date: 02/25/2020 CLINICAL DATA:  Bilateral lower extremity edema EXAM: BILATERAL LOWER EXTREMITY VENOUS DOPPLER ULTRASOUND TECHNIQUE: Gray-scale sonography with  compression, as well as color and duplex ultrasound, were performed to evaluate the deep venous system(s) from the level of the common femoral vein through the popliteal and proximal calf veins. COMPARISON:  None. FINDINGS: VENOUS Normal compressibility of the common femoral, superficial femoral, and popliteal veins, as well as the visualized calf veins. Visualized portions of profunda femoral vein and great saphenous vein unremarkable. No filling defects to suggest DVT on grayscale or color Doppler imaging. Doppler waveforms show normal direction of venous flow, normal respiratory plasticity and response to augmentation. OTHER None. Limitations: The peroneal veins of the lower extremities bilaterally were not well visualized on this examination. IMPRESSION: Negative. Electronically Signed   By: Fidela Salisbury MD   On: 02/25/2020 01:19    Imaging reviewed, small bilateral pleural effusions and atelectasis. ____________________________________________   PROCEDURES  Procedure(s) performed: None  Procedures  Critical Care performed: No  ____________________________________________   INITIAL IMPRESSION / ASSESSMENT AND PLAN / ED COURSE  Pertinent labs & imaging results that were available during my care of the patient were reviewed by me and considered in my medical decision making (see chart for details).   Dyspnea.  Positional, worsened by laying flat.  Does have a history of congestive heart failure, cardiac disease recent pacemaker implantation.  She does have mild hypoxia at rest especially with laying down and symptomatic.  Has taken herself off of her diuretic.  Appears most consistent with CHF.  Will treat with Lasix, await BNP, chest x-ray shows mild bilateral pleural effusions small in nature.  She has no associated chest pain.  No history of blood clots.  Not complaining of any signs or symptoms suggest DVT or PE.  No evidence of acute ACS, but will add troponin and BNP to her pending  labs which are pending at time of admission.  Admission discussed with Dr. Damita Dunnings.  Will admit due to the patient's oxygen requirement, overall she is quite stable with regard to  her work of breathing however but I suspect needs diuresis and further work-up, consider cardiology consultation or repeat echo etc.  Further work-up under hospitalist  [place] was evaluated in Emergency Department on 02/25/2020 for the symptoms described in the history of present illness. She was evaluated in the context of the global COVID-19 pandemic, which necessitated consideration that the patient might be at risk for infection with the SARS-CoV-2 virus that causes COVID-19. Institutional protocols and algorithms that pertain to the evaluation of patients at risk for COVID-19 are in a state of rapid change based on information released by regulatory bodies including the CDC and federal and state organizations. These policies and algorithms were followed during the patient's care in the ED.       ____________________________________________   FINAL CLINICAL IMPRESSION(S) / ED DIAGNOSES  Final diagnoses:  Hypoxia  Volume overload state of heart        Note:  This document was prepared using Dragon voice recognition software and may include unintentional dictation errors       Delman Kitten, MD 02/25/20 [place]    Delman Kitten, MD 02/25/20 [phone]

## 2020-02-25 NOTE — Consult Note (Addendum)
Cardiology Consultation Note    Patient ID: Amber [name], MRN: [id number], DOB/AGE: [birth year] 86 y.o. Admit date: 02/24/2020   Date of Consult: 02/25/2020 Primary Physician: Idelle Crouch, MD Primary Cardiologist: Dr. Nehemiah Massed  Chief Complaint: sob Reason for Consultation: [name] Requesting MD: Dr. Posey Pronto  HPI: Amber [name] is a 86 y.o. female with history of HFpEF with known ejection fraction of approximately 55-60 % by echo done 2 months ago with LVH, moderate aortic stenosis with a valve area of 0.8 cm with a mean gradient of 12 mmHg, CAD status post coronary bypass grafting, diabetes, hypertension, history of a permanent pacemaker, history of paroxysmal atrial fibrillation anticoagulated with apixaban who presented to the emergency room with increasing shortness of breath with low pulse ox.  She admits to not taking her Lasix for quite some time due to the fact that it makes her urinate too much.  She has had 3-4 pillow orthopnea.  She also has been noncompliant with her apixaban per her report.  Unclear regarding compliance with the remainder of her medications.  Chest x-ray revealed very small bilateral pleural effusions.  No obvious severe pulmonary edema.  Lower extremity ultrasound revealed no evidence of DVT.  She is Covid negative.  BNP is 223 significantly less than 1 done 3 years ago.  High-sensitivity troponin is normal.  Renal function electrolytes are normal.  EKG showed sinus rhythm with no ischemia.  She has diuresed approximately 2 L since admission.  She feels better but states she is not happy taking Lasix at home.  Had a long discussion regarding the importance of use of this medication.  She is on an aggressive heart failure regimen including Entresto 24-26 twice daily, carvedilol 6.25 mg twice daily, amlodipine 10 mg daily.  She is currently hemodynamically stable. Past Medical History:  Diagnosis Date  . Anemia   . Anxiety   . Arthritis   . Benign neoplasm  of colon   . CHF (congestive heart failure) ([place])   . Chronic kidney disease   . Chronic pain   . Complication of anesthesia 1980's   hard time waking up  . Coronary artery disease with unspecified angina pectoris   . Cough   . Diabetes mellitus without complication ([place])   . Essential hypertension   . GERD (gastroesophageal reflux disease)   . High risk medication use   . Hyperlipidemia   . Plantar fascial fibromatosis   . Presence of permanent cardiac pacemaker   . Shortness of breath dyspnea       Surgical History:  Past Surgical History:  Procedure Laterality Date  . ABDOMINAL HYSTERECTOMY    . BACK SURGERY  1960's   cage and screws in lower back  . CATARACT EXTRACTION W/ INTRAOCULAR LENS  IMPLANT, BILATERAL Bilateral   . CORONARY ARTERY BYPASS GRAFT  2008   triple  . HARDWARE REMOVAL Right 12/08/2019   Procedure: HARDWARE REMOVAL;  Surgeon: Hessie Knows, MD;  Location: [facility];  Service: Orthopedics;  Laterality: Right;  . JOINT REPLACEMENT Right    hip and knee  . ORIF ANKLE FRACTURE Right 07/27/2014   Procedure: OPEN REDUCTION INTERNAL FIXATION (ORIF) ANKLE FRACTURE;  Surgeon: Alta Corning, MD;  Location: [facility];  Service: Orthopedics;  Laterality: Right;  . ORIF TIBIA FRACTURE Right 12/08/2019   Procedure: OPEN REDUCTION INTERNAL FIXATION (ORIF) TIBIA FRACTURE;  Surgeon: Hessie Knows, MD;  Location: [facility];  Service: Orthopedics;  Laterality: Right;  . PACEMAKER INSERTION Left  03/14/2015   Procedure: INSERTION PACEMAKER;  Surgeon: Isaias Cowman, MD;  Location: [facility];  Service: Cardiovascular;  Laterality: Left;  . PACEMAKER LEADLESS INSERTION N/A 01/24/2020   Procedure: PACEMAKER LEADLESS INSERTION;  Surgeon: Isaias Cowman, MD;  Location: [facility];  Service: Cardiovascular;  Laterality: N/A;  . ROTATOR CUFF REPAIR Left   . TOTAL HIP ARTHROPLASTY Right   . TOTAL HIP ARTHROPLASTY Left 02/09/2018   Procedure: TOTAL HIP ARTHROPLASTY ANTERIOR  APPROACH;  Surgeon: Hessie Knows, MD;  Location: [facility];  Service: Orthopedics;  Laterality: Left;  . TOTAL KNEE ARTHROPLASTY Right   . VEIN LIGATION AND STRIPPING       Home Meds: Prior to Admission medications   Medication Sig Start Date End Date Taking? Authorizing Provider  acetaminophen (TYLENOL) 500 MG tablet Take 1,000 mg by mouth every 6 (six) hours as needed for mild pain or headache.     [provider]  albuterol (PROVENTIL HFA;VENTOLIN HFA) 108 (90 Base) MCG/ACT inhaler Inhale 2 puffs into the lungs every 6 (six) hours as needed for wheezing or shortness of breath. 01/28/17   Loletha Grayer, MD  ALPRAZolam Duanne Moron) 1 MG tablet Take 1 tablet (1 mg total) by mouth 2 (two) times daily as needed for anxiety or sleep. 12/12/19   Fritzi Mandes, MD  amLODipine (NORVASC) 10 MG tablet Take 5 mg by mouth daily.     [provider]  apixaban (ELIQUIS) 5 MG TABS tablet Take 5 mg by mouth 2 (two) times daily.    [provider]  calcium-vitamin D (OSCAL WITH D) 500-200 MG-UNIT tablet Take 1 tablet by mouth daily.    [provider]  carvedilol (COREG) 6.25 MG tablet Take 1 tablet (6.25 mg total) by mouth 2 (two) times daily with a meal. 01/28/17   Loletha Grayer, MD  ferrous sulfate 325 (65 FE) MG tablet Take 1 tablet (325 mg total) by mouth 2 (two) times daily with a meal. Patient not taking: Reported on 01/24/2020 12/12/19   Fritzi Mandes, MD  gabapentin (NEURONTIN) 100 MG capsule Take 200 mg by mouth 3 (three) times daily.     [provider]  insulin glargine (LANTUS) 100 UNIT/ML injection Inject 22 Units into the skin daily.    [provider]  levETIRAcetam (KEPPRA) 500 MG tablet Take 500 mg by mouth 2 (two) times daily.    [provider]  Multiple Vitamins-Minerals (PRESERVISION AREDS 2) CAPS Take 1 capsule by mouth daily.    [provider]  omeprazole (PRILOSEC) 40 MG capsule Take 40 mg by mouth daily.     [provider]  pravastatin (PRAVACHOL) 40 MG tablet Take 40 mg by mouth at bedtime.    [provider]    Inpatient Medications:  . apixaban  5 mg Oral BID  . carvedilol  6.25 mg Oral BID WC  . furosemide  40 mg Intravenous Q12H  . insulin aspart  0-15 Units Subcutaneous TID WC  . insulin aspart  0-5 Units Subcutaneous QHS  . lisinopril  5 mg Oral Daily  . sodium chloride flush  3 mL Intravenous Q12H   . sodium chloride      Allergies:  Allergies  Allergen Reactions  . Ambien [Zolpidem Tartrate] Other (See Comments)    Reaction:  Keeps pt awake   . Penicillins Itching and Other (See Comments)    Has patient had a PCN reaction causing immediate rash, facial/tongue/throat swelling, SOB or lightheadedness with hypotension: No Has patient  had a PCN reaction causing severe rash involving mucus membranes or skin necrosis: No Has patient had a PCN reaction that required hospitalization No Has patient had a PCN reaction occurring within the last 10 years: No If all of the above answers are "NO", then may proceed with Cephalosporin use.  . Iodine Itching  . Succinylcholine Other (See Comments)    Reaction:  Unknown   . Etodolac Other (See Comments)    GI upset  . Nsaids Other (See Comments)    Reaction: gi upset    Social History   Socioeconomic History  . Marital status: Widowed    Spouse name: Not on file  . Number of children: Not on file  . Years of education: Not on file  . Highest education level: Not on file  Occupational History  . Not on file  Tobacco Use  . Smoking status: Never Smoker  . Smokeless tobacco: Never Used  Vaping Use  . Vaping Use: Never used  Substance and Sexual Activity  . Alcohol use: No  . Drug use: No  . Sexual activity: Not on file  Other Topics Concern  . Not on file  Social History Narrative  . Not on file   Social Determinants of Health   Financial Resource Strain:   . Difficulty of Paying Living Expenses: Not on file   Food Insecurity:   . Worried About Charity fundraiser in the Last Year: Not on file  . Ran Out of Food in the Last Year: Not on file  Transportation Needs:   . Lack of Transportation (Medical): Not on file  . Lack of Transportation (Non-Medical): Not on file  Physical Activity:   . Days of Exercise per Week: Not on file  . Minutes of Exercise per Session: Not on file  Stress:   . Feeling of Stress : Not on file  Social Connections:   . Frequency of Communication with Friends and Family: Not on file  . Frequency of Social Gatherings with Friends and Family: Not on file  . Attends Religious Services: Not on file  . Active Member of Clubs or Organizations: Not on file  . Attends Archivist Meetings: Not on file  . Marital Status: Not on file  Intimate Partner Violence:   . Fear of Current or Ex-Partner: Not on file  . Emotionally Abused: Not on file  . Physically Abused: Not on file  . Sexually Abused: Not on file     Family History  Problem Relation Age of Onset  . Heart attack Mother   . Heart disease Father   . Alzheimer's disease Sister   . Cervical cancer Sister   . Heart failure Son      Review of Systems: A 12-system review of systems was performed and is negative except as noted in the HPI.  Labs: No results for input(s): CKTOTAL, CKMB, TROPONINI in the last 72 hours. Lab Results  Component Value Date   WBC 6.2 02/24/2020   HGB 10.4 (L) 02/24/2020   HCT 32.7 (L) 02/24/2020   MCV 85.4 02/24/2020   PLT 289 02/24/2020    Recent Labs  Lab 02/24/20 1246  NA 139  K 3.8  CL 101  CO2 29  BUN 17  CREATININE 0.73  CALCIUM 9.5  PROT 7.5  BILITOT 0.8  ALKPHOS 78  ALT 11  AST 17  GLUCOSE 112*   Lab Results  Component Value Date   CHOL 164 12/07/2019   HDL 43  12/07/2019   LDLCALC 107 (H) 12/07/2019   TRIG 68 12/07/2019   No results found for: DDIMER  Radiology/Studies:  DG Chest 2 View  Result Date: 02/24/2020 CLINICAL DATA:  Onset  shortness of breath when the patient woke up this morning. EXAM: CHEST - 2 VIEW COMPARISON:  PA and lateral chest 02/13/2020. FINDINGS: Small bilateral pleural effusions and mild basilar atelectasis are again seen. The lungs are otherwise clear. Cardiomegaly. Pacing device is unchanged. Loop recorder is in place. The patient is status post median sternotomy. No acute or focal bony abnormality. IMPRESSION: No change in very small bilateral pleural effusions and basilar atelectasis since the most recent exam. Electronically Signed   By: Inge Rise M.D.   On: 02/24/2020 14:29   DG Chest 2 View  Result Date: 02/13/2020 CLINICAL DATA:  Shortness of breath developing over the past 4 days. EXAM: CHEST - 2 VIEW COMPARISON:  PA and lateral chest 01/26/2017. FINDINGS: The patient has small bilateral pleural effusions and basilar atelectasis. The lungs are otherwise clear. No pulmonary edema. Heart size is upper normal. The patient is status post CABG with a pacing device in place. Atherosclerosis noted. No acute bony abnormality. IMPRESSION: Small bilateral pleural effusions and mild basilar atelectasis. Aortic Atherosclerosis (ICD10-I70.0). Electronically Signed   By: Inge Rise M.D.   On: 02/13/2020 14:28   US Venous Img Lower Bilateral  Result Date: 02/25/2020 CLINICAL DATA:  Bilateral lower extremity edema EXAM: BILATERAL LOWER EXTREMITY VENOUS DOPPLER ULTRASOUND TECHNIQUE: Gray-scale sonography with compression, as well as color and duplex ultrasound, were performed to evaluate the deep venous system(s) from the level of the common femoral vein through the popliteal and proximal calf veins. COMPARISON:  None. FINDINGS: VENOUS Normal compressibility of the common femoral, superficial femoral, and popliteal veins, as well as the visualized calf veins. Visualized portions of profunda femoral vein and great saphenous vein unremarkable. No filling defects to suggest DVT on grayscale or color Doppler  imaging. Doppler waveforms show normal direction of venous flow, normal respiratory plasticity and response to augmentation. OTHER None. Limitations: The peroneal veins of the lower extremities bilaterally were not well visualized on this examination. IMPRESSION: Negative. Electronically Signed   By: Fidela Salisbury MD   On: 02/25/2020 01:19    Wt Readings from Last 3 Encounters:  02/24/20 80.3 kg  02/13/20 80 kg  01/24/20 80.1 kg    EKG: Sinus bradycardia with no ischemia  Physical Exam: Elderly female in no acute distress Blood pressure (!) 180/71, pulse (!) 59, temperature 98.5 F (36.9 C), temperature source Oral, resp. rate 14, height 4\' 11"  (1.499 m), weight 80.3 kg, SpO2 96 %. Body mass index is 35.75 kg/m. General: Well developed, well nourished, in no acute distress. Head: Normocephalic, atraumatic, sclera non-icteric, no xanthomas, nares are without discharge.  Neck: Negative for carotid bruits. JVD not elevated. Lungs: Clear bilaterally to auscultation without wheezes, rales, or rhonchi. Breathing is unlabored. Heart: RRR with S1 S2. No murmurs, rubs, or gallops appreciated. Abdomen: Soft, non-tender, non-distended with normoactive bowel sounds. No hepatomegaly. No rebound/guarding. No obvious abdominal masses. Msk:  Strength and tone appear normal for age. Extremities: No clubbing or cyanosis. No edema.  Distal pedal pulses are 2+ and equal bilaterally. Neuro: Alert and oriented X 3. No facial asymmetry. No focal deficit. Moves all extremities spontaneously. Psych:  Responds to questions appropriately with a normal affect.     Assessment and Plan  86 y.o. female with history of HFpEF with known ejection fraction of  approximately 55-60 % by echo done 2 months ago with LVH, moderate aortic stenosis with a valve area of 0.8 cm with a mean gradient of 12 mmHg, CAD status post coronary bypass grafting, diabetes, hypertension, history of a permanent pacemaker, history of paroxysmal  atrial fibrillation anticoagulated with apixaban who presented to the emergency room with increasing shortness of breath with low pulse ox.  She admits to not taking her Lasix for quite some time due to the fact that it makes her urinate too much.  CHF-has preserved LV function by echo done several months ago.  Volume overload.  Be secondary to medication noncompliance including Lasix.  She states she was on twice daily furosemide at stopped it altogether due to frequent urination at night.  She also is reportedly on Entresto, carvedilol.  Will resume diuresis and she has diuresed approximately 2 L since discharge.  Stressed importance of compliance with the Lasix.  Will attempt to modify her regimen to wear she only takes it in the morning and not in the afternoon or evening to help with nocturnal urinary frequency.  We'll continue with carvedilol 6.25 mg twice daily.  Continue with furosemide 40 IV every 12 for today and convert to p.o. in the morning.  Likely would attempt to use 40 mg p.o. every morning.  Patient was reportedly on Entresto as an outpatient but unclear why she is taking this.  For now would continue with lisinopril as you are doing to modify her blood pressure.  Will need to increase this to 10 mg daily.If stable would discharge to home today with outpatient follow up next week as an outpatient.   Atrial fibrillation-currently in sinus rhythm.  Will put her back on an Eliquis as she states she has been noncompliant with this.  We'll continue with carvedilol for now as she is relatively bradycardic she has a Designer, industrial/product pacemaker in place.  This was placed 01/24/2020.  Permanent pacemaker.-Has leadless Micra device in place.  This was placed approximately 1 month ago.  Currently stable.  CAD-no evidence of ischemia at present.  We'll continue with current regimen.  Hypertension-we'll increase lisinopril to 10 and continue with carvedilol following  pressure.   signed, Teodoro Spray MD 02/25/2020, 8:41 AM Pager: [phone]

## 2020-02-25 NOTE — ED Notes (Signed)
Pt has removed purewick and urinated on bed again. Pt informed to leave purewick in place to capture urine, pt cleansed, sheets changed.

## 2020-02-25 NOTE — H&P (Signed)
History and Physical    [name] RXV:[id number] DOB: [birth year] DOA: 02/24/2020  PCP: Idelle Crouch, MD   Patient coming from: home  I have personally briefly reviewed patient's old medical records in [place]  Chief Complaint: Shortness of breath  HPI: Amber [name] is a 86 y.o. female with medical history significant for  dCHF, , coronary artery disease status post CABG, diabetes, hypertension,  PPM, paroxysmal A. fib on anticoagulation with apixaban, followed by Dr. Nehemiah Massed, who presents to the emergency room with shortness of breath and reportedly low oxygen saturation in the 70s to 80s noticed while at physical therapy earlier in the day for which she was sent to the emergency room for evaluation. Patient presented to the emergency room with similar concerns a week prior but left without being seen due to long wait time. Patient also reports lower extremity edema and 3-4 pillow orthopnea over the past 2 weeks. She admits to noncompliance with Lasix because it keeps her up at night going to the bathroom every 1 hour. She also reports recently running out of her apixaban. ED Course: On arrival in the emergency room, O2 sats were 90% on room air going as low as 87% with exertion with otherwise normal vitals except for slightly elevated blood pressure. Blood work significant for BNP of 223 but with troponin of 5 and otherwise blood work at baseline. Covid PCR negative. EKG showing ventricular paced rhythm chest x-ray showed no change when compared to PA and lateral chest from 02/13/2020 and with very small bilateral pleural effusions. Patient given IV Lasix. Hospitalist consulted for admission. Patient did have lower extremity Doppler of the right leg which she had orthopedic surgery, that was negative for DVT  review of Systems: As per HPI otherwise all other systems on review of systems negative.    Past Medical History:  Diagnosis Date  . Anemia   . Anxiety   .  Arthritis   . Benign neoplasm of colon   . CHF (congestive heart failure) ([place])   . Chronic kidney disease   . Chronic pain   . Complication of anesthesia 1980's   hard time waking up  . Coronary artery disease with unspecified angina pectoris   . Cough   . Diabetes mellitus without complication ([place])   . Essential hypertension   . GERD (gastroesophageal reflux disease)   . High risk medication use   . Hyperlipidemia   . Plantar fascial fibromatosis   . Presence of permanent cardiac pacemaker   . Shortness of breath dyspnea     Past Surgical History:  Procedure Laterality Date  . ABDOMINAL HYSTERECTOMY    . BACK SURGERY  1960's   cage and screws in lower back  . CATARACT EXTRACTION W/ INTRAOCULAR LENS  IMPLANT, BILATERAL Bilateral   . CORONARY ARTERY BYPASS GRAFT  2008   triple  . HARDWARE REMOVAL Right 12/08/2019   Procedure: HARDWARE REMOVAL;  Surgeon: Hessie Knows, MD;  Location: [facility];  Service: Orthopedics;  Laterality: Right;  . JOINT REPLACEMENT Right    hip and knee  . ORIF ANKLE FRACTURE Right 07/27/2014   Procedure: OPEN REDUCTION INTERNAL FIXATION (ORIF) ANKLE FRACTURE;  Surgeon: Alta Corning, MD;  Location: [facility];  Service: Orthopedics;  Laterality: Right;  . ORIF TIBIA FRACTURE Right 12/08/2019   Procedure: OPEN REDUCTION INTERNAL FIXATION (ORIF) TIBIA FRACTURE;  Surgeon: Hessie Knows, MD;  Location: [facility];  Service: Orthopedics;  Laterality: Right;  . PACEMAKER  INSERTION Left 03/14/2015   Procedure: INSERTION PACEMAKER;  Surgeon: Isaias Cowman, MD;  Location: [facility];  Service: Cardiovascular;  Laterality: Left;  . PACEMAKER LEADLESS INSERTION N/A 01/24/2020   Procedure: PACEMAKER LEADLESS INSERTION;  Surgeon: Isaias Cowman, MD;  Location: [facility];  Service: Cardiovascular;  Laterality: N/A;  . ROTATOR CUFF REPAIR Left   . TOTAL HIP ARTHROPLASTY Right   . TOTAL HIP ARTHROPLASTY Left 02/09/2018   Procedure: TOTAL HIP ARTHROPLASTY  ANTERIOR APPROACH;  Surgeon: Hessie Knows, MD;  Location: [facility];  Service: Orthopedics;  Laterality: Left;  . TOTAL KNEE ARTHROPLASTY Right   . VEIN LIGATION AND STRIPPING       reports that she has never smoked. She has never used smokeless tobacco. She reports that she does not drink alcohol and does not use drugs.  Allergies  Allergen Reactions  . Ambien [Zolpidem Tartrate] Other (See Comments)    Reaction:  Keeps pt awake   . Penicillins Itching and Other (See Comments)    Has patient had a PCN reaction causing immediate rash, facial/tongue/throat swelling, SOB or lightheadedness with hypotension: No Has patient had a PCN reaction causing severe rash involving mucus membranes or skin necrosis: No Has patient had a PCN reaction that required hospitalization No Has patient had a PCN reaction occurring within the last 10 years: No If all of the above answers are "NO", then may proceed with Cephalosporin use.  . Iodine Itching  . Succinylcholine Other (See Comments)    Reaction:  Unknown   . Etodolac Other (See Comments)    GI upset  . Nsaids Other (See Comments)    Reaction: gi upset    Family History  Problem Relation Age of Onset  . Heart attack Mother   . Heart disease Father   . Alzheimer's disease Sister   . Cervical cancer Sister   . Heart failure Son       Prior to Admission medications   Medication Sig Start Date End Date Taking? Authorizing Provider  acetaminophen (TYLENOL) 500 MG tablet Take 1,000 mg by mouth every 6 (six) hours as needed for mild pain or headache.     [provider]  albuterol (PROVENTIL HFA;VENTOLIN HFA) 108 (90 Base) MCG/ACT inhaler Inhale 2 puffs into the lungs every 6 (six) hours as needed for wheezing or shortness of breath. 01/28/17   Loletha Grayer, MD  ALPRAZolam Duanne Moron) 1 MG tablet Take 1 tablet (1 mg total) by mouth 2 (two) times daily as needed for anxiety or sleep. 12/12/19   Fritzi Mandes, MD  amLODipine (NORVASC) 10 MG  tablet Take 5 mg by mouth daily.     [provider]  apixaban (ELIQUIS) 5 MG TABS tablet Take 5 mg by mouth 2 (two) times daily.    [provider]  calcium-vitamin D (OSCAL WITH D) 500-200 MG-UNIT tablet Take 1 tablet by mouth daily.    [provider]  carvedilol (COREG) 6.25 MG tablet Take 1 tablet (6.25 mg total) by mouth 2 (two) times daily with a meal. 01/28/17   Loletha Grayer, MD  ferrous sulfate 325 (65 FE) MG tablet Take 1 tablet (325 mg total) by mouth 2 (two) times daily with a meal. Patient not taking: Reported on 01/24/2020 12/12/19   Fritzi Mandes, MD  gabapentin (NEURONTIN) 100 MG capsule Take 200 mg by mouth 3 (three) times daily.     [provider]  insulin glargine (LANTUS) 100 UNIT/ML injection Inject 22 Units into the skin daily.  [provider]  levETIRAcetam (KEPPRA) 500 MG tablet Take 500 mg by mouth 2 (two) times daily.    [provider]  Multiple Vitamins-Minerals (PRESERVISION AREDS 2) CAPS Take 1 capsule by mouth daily.    [provider]  omeprazole (PRILOSEC) 40 MG capsule Take 40 mg by mouth daily.     [provider]  pravastatin (PRAVACHOL) 40 MG tablet Take 40 mg by mouth at bedtime.    [provider]    Physical Exam: Vitals:   02/24/20 2300 02/24/20 2315 02/25/20 0000 02/25/20 0015  BP: (!) 190/71     Pulse: 63 (!) 59 62 62  Resp:   14 14  Temp:      TempSrc:      SpO2: (!) 87% 100% 93% 96%  Weight:      Height:         Vitals:   02/24/20 2300 02/24/20 2315 02/25/20 0000 02/25/20 0015  BP: (!) 190/71     Pulse: 63 (!) 59 62 62  Resp:   14 14  Temp:      TempSrc:      SpO2: (!) 87% 100% 93% 96%  Weight:      Height:          Constitutional: Alert and oriented x 3 .  Mild conversational dyspnea HEENT:      Head: Normocephalic and atraumatic.         Eyes: PERLA, EOMI, Conjunctivae are normal. Sclera is non-icteric.       Mouth/Throat: Mucous membranes are  moist.       Neck: Supple with no signs of meningismus. Cardiovascular: Regular rate and rhythm. No murmurs, gallops, or rubs. 2+ symmetrical distal pulses are present . No JVD. No 2+LE edema Respiratory: Respiratory effort slightly increased.Lungs sounds diminished bilaterally with bibasilar rales gastrointestinal: Soft, non tender, and non distended with positive bowel sounds. No rebound or guarding. Genitourinary: No CVA tenderness. Musculoskeletal: Nontender with normal range of motion in all extremities. No cyanosis, or erythema of extremities.  Some swelling at right ankle at site of previous orthopedic surgery Neurologic: Normal speech and language. Face is symmetric. Moving all extremities. No gross focal neurologic deficits . Skin: Skin is warm, dry.  No rash or ulcers Psychiatric: Mood and affect are normal Speech and behavior are normal   Labs on Admission: I have personally reviewed following labs and imaging studies  CBC: Recent Labs  Lab 02/24/20 1246  WBC 6.2  NEUTROABS 4.1  HGB 10.4*  HCT 32.7*  MCV 85.4  PLT 528   Basic Metabolic Panel: Recent Labs  Lab 02/24/20 1246  NA 139  K 3.8  CL 101  CO2 29  GLUCOSE 112*  BUN 17  CREATININE 0.73  CALCIUM 9.5   GFR: Estimated Creatinine Clearance: 45.4 mL/min (by C-G formula based on SCr of 0.73 mg/dL). Liver Function Tests: Recent Labs  Lab 02/24/20 1246  AST 17  ALT 11  ALKPHOS 78  BILITOT 0.8  PROT 7.5  ALBUMIN 3.9   No results for input(s): LIPASE, AMYLASE in the last 168 hours. No results for input(s): AMMONIA in the last 168 hours. Coagulation Profile: No results for input(s): INR, PROTIME in the last 168 hours. Cardiac Enzymes: No results for input(s): CKTOTAL, CKMB, CKMBINDEX, TROPONINI in the last 168 hours. BNP (last 3 results) No results for input(s): PROBNP in the last 8760 hours. HbA1C: No results for input(s): HGBA1C in the last 72 hours. CBG: No results for input(s):  GLUCAP in the last  168 hours. Lipid Profile: No results for input(s): CHOL, HDL, LDLCALC, TRIG, CHOLHDL, LDLDIRECT in the last 72 hours. Thyroid Function Tests: No results for input(s): TSH, T4TOTAL, FREET4, T3FREE, THYROIDAB in the last 72 hours. Anemia Panel: No results for input(s): VITAMINB12, FOLATE, FERRITIN, TIBC, IRON, RETICCTPCT in the last 72 hours. Urine analysis:    Component Value Date/Time   COLORURINE YELLOW (A) 12/06/2019 [place] (A) 12/06/2019 1829   APPEARANCEUR Hazy 11/25/2013 2129   LABSPEC 1.016 12/06/2019 1829   LABSPEC 1.019 11/25/2013 2129   PHURINE 5.0 12/06/2019 1829   GLUCOSEU NEGATIVE 12/06/2019 1829   GLUCOSEU Negative 11/25/2013 2129   HGBUR NEGATIVE 12/06/2019 1829   BILIRUBINUR NEGATIVE 12/06/2019 1829   BILIRUBINUR Negative 11/25/2013 2129   KETONESUR NEGATIVE 12/06/2019 1829   PROTEINUR NEGATIVE 12/06/2019 1829   NITRITE NEGATIVE 12/06/2019 1829   LEUKOCYTESUR NEGATIVE 12/06/2019 1829   LEUKOCYTESUR Trace 11/25/2013 2129    Radiological Exams on Admission: DG Chest 2 View  Result Date: 02/24/2020 CLINICAL DATA:  Onset shortness of breath when the patient woke up this morning. EXAM: CHEST - 2 VIEW COMPARISON:  PA and lateral chest 02/13/2020. FINDINGS: Small bilateral pleural effusions and mild basilar atelectasis are again seen. The lungs are otherwise clear. Cardiomegaly. Pacing device is unchanged. Loop recorder is in place. The patient is status post median sternotomy. No acute or focal bony abnormality. IMPRESSION: No change in very small bilateral pleural effusions and basilar atelectasis since the most recent exam. Electronically Signed   By: Inge Rise M.D.   On: 02/24/2020 14:29   US Venous Img Lower Bilateral  Result Date: 02/25/2020 CLINICAL DATA:  Bilateral lower extremity edema EXAM: BILATERAL LOWER EXTREMITY VENOUS DOPPLER ULTRASOUND TECHNIQUE: Gray-scale sonography with compression, as well as color and duplex ultrasound, were  performed to evaluate the deep venous system(s) from the level of the common femoral vein through the popliteal and proximal calf veins. COMPARISON:  None. FINDINGS: VENOUS Normal compressibility of the common femoral, superficial femoral, and popliteal veins, as well as the visualized calf veins. Visualized portions of profunda femoral vein and great saphenous vein unremarkable. No filling defects to suggest DVT on grayscale or color Doppler imaging. Doppler waveforms show normal direction of venous flow, normal respiratory plasticity and response to augmentation. OTHER None. Limitations: The peroneal veins of the lower extremities bilaterally were not well visualized on this examination. IMPRESSION: Negative. Electronically Signed   By: Fidela Salisbury MD   On: 02/25/2020 01:19    EKG: Independently reviewed. Interpretation : Ventricular paced rhythm  Assessment/Plan 86 year old female with history of dCHF, coronary artery disease status post CABG, diabetes, hypertension,  PPM, questionable history of A. fib on anticoagulation with apixaban, presenting with shortness of breath and hypoxia   Acute on chronic diastolic CHF (congestive heart failure) (HCC) -IV Lasix, metoprolol and lisinopril -Likely related to noncompliance with Lasix -Daily weights with intake and output monitoring -Last echocardiogram in June 2021 with EF 55 to 60% -Cardiology consult    Acute respiratory failure with hypoxia ([place]) -O2 sat while in physical therapy, with exertion 70s to 80s percent on room air, 87-88 in the ER -O2 sat to keep sats over 92% -Likely secondary to CHF above    Essential hypertension -Continue home meds pending med rec    CAD (coronary artery disease) -No complaints of chest pain, EKG nonacute, troponin was negative -Continue home meds pending med rec    Type 2 diabetes mellitus with  diabetic polyneuropathy, with long-term current use of insulin (HCC) -Sliding scale insulin pending med  rec    History of atrial fibrillation -Continue apixaban    Pacemaker -Patient recently saw her cardiologist as outpatient. Consider interrogation    DVT prophylaxis: Apixaban code Status: full code  Family Communication:  none  Disposition Plan: Back to previous home environment Consults called: Cardiology Status:At the time of admission, it appears that the appropriate admission status for this patient is INPATIENT. This is judged to be reasonable and necessary in order to provide the required intensity of service to ensure the patient's safety given the presenting symptoms, physical exam findings, and initial radiographic and laboratory data in the context of their  Comorbid conditions.   Patient requires inpatient status due to high intensity of service, high risk for further deterioration and high frequency of surveillance required.   I certify that at the point of admission it is my clinical judgment that the patient will require inpatient hospital care spanning beyond [place] MD [facility]     02/25/2020, 2:02 AM

## 2020-02-25 NOTE — ED Notes (Signed)
Pt requested both side rails to be put up. Resting in bed.

## 2020-02-25 NOTE — ED Notes (Signed)
Pt sleeping. 

## 2020-02-25 NOTE — ED Notes (Signed)
Spoke with pt daughter in [employer], [name]. [name] states that pt does not have O2 at home. Pts PCP had mentioned ordering O2 for home but this has not been done yet. Dr. Posey Pronto aware

## 2020-02-25 NOTE — Progress Notes (Signed)
PROGRESS NOTE    Amber [name]  XTK:[id number] DOB: 11-08-1929 DOA: 02/24/2020 PCP: Idelle Crouch, MD   Brief Narrative: Amber [name] is a 86 y.o. female with medical history significant for  dCHF, , coronary artery disease status post CABG, diabetes, hypertension,  PPM, paroxysmal A. fib on anticoagulation with apixaban, followed by Dr. Nehemiah Massed, who presents to the emergency room with shortness of breath and reportedly low oxygen saturation in the 70s to 80s noticed while at physical therapy earlier in the day for which she was sent to the emergency room for evaluation. Patient presented to the emergency room with similar concerns a week prior but left without being seen due to long wait time. Patient also reports lower extremity edema and 3-4 pillow orthopnea over the past 2 weeks. She admits to noncompliance with Lasix because it keeps her up at night going to the bathroom every 1 hour. She also reports recently running out of her apixaban. On arrival in the emergency room, O2 sats were 90% on room air going as low as 87% with exertion with otherwise normal vitals except for slightly elevated blood pressure. Blood work significant for BNP of 223 but with troponin of 5 and otherwise blood work at baseline. Covid PCR negative. EKG showing ventricular paced rhythm chest x-ray showed no change when compared to PA and lateral chest from 02/13/2020 and with very small bilateral pleural effusions. Patient given IV Lasix. Hospitalist consulted for admission. Patient did have lower extremity Doppler of the right leg which she had orthopedic surgery, that was negative for DVT  Assessment & Plan:   Principal Problem:   Acute on chronic diastolic CHF (congestive heart failure) (HCC) Active Problems:   Essential hypertension   Acute respiratory failure with hypoxia (HCC)   CAD (coronary artery disease)   Type 2 diabetes mellitus with diabetic polyneuropathy, with long-term current use of  insulin (HCC)   History of atrial fibrillation   Pacemaker  Acute on chronic diastolic CHF (congestive heart failure) (HCC) -IV Lasix, metoprolol and lisinopril -Likely related to noncompliance with Lasix -Daily weights with intake and output monitoring -Last echocardiogram in June 2021 with EF 55 to 60% -Cardiology consult    Acute respiratory failure with hypoxia ([place]) -O2 sat while in physical therapy, with exertion 70s to 80s percent on room air, 87-88 in the ER -O2 sat to keep sats over 92% -Likely secondary to CHF above    Essential hypertension -Continue home meds pending med rec    CAD (coronary artery disease) -No complaints of chest pain, EKG nonacute, troponin was negative -Continue home meds pending med rec    Type 2 diabetes mellitus with diabetic polyneuropathy, with long-term current use of insulin (HCC) -Sliding scale insulin pending med rec    History of atrial fibrillation -Continue apixaban    Pacemaker -Patient recently saw her cardiologist as outpatient. Per cardiology.   DVT prophylaxis: Apixaban  code Status: full code  Family Communication:  none  Disposition Plan: Back to previous home environment Consults called: Cardiology-Dr.Fath.   Subjective: Pt is alert and oriented and reports sob and has been noncompliant with her diuretic therapy.   Objective: Vitals:   02/25/20 0943 02/25/20 1038 02/25/20 1500 02/25/20 1650  BP: (!) 111/59 (!) 151/56  (!) 160/76  Pulse: 73 61 (!) 59 60  Resp: 13 12 13 17   Temp:      TempSrc:      SpO2: 96% 99% 98% 98%  Weight:  Height:        Intake/Output Summary (Last 24 hours) at 02/25/2020 1932 Last data filed at 02/25/2020 1203 Gross per 24 hour  Intake --  Output 2550 ml  Net -2550 ml   Filed Weights   02/24/20 1244  Weight: 80.3 kg    Examination: Blood pressure (!) 160/76, pulse 60, temperature 98.5 F (36.9 C), temperature source Oral, resp. rate 17, height 4\' 11"  (1.499 m),  weight 80.3 kg, SpO2 98 %.  General exam: Appears calm and comfortable  Respiratory system: Clear to auscultation. Respiratory effort normal. Cardiovascular system: S1 & S2 heard, RRR. No JVD, murmurs, rubs, gallops or clicks. No pedal edema. Gastrointestinal system: Abdomen is nondistended, soft and nontender. No organomegaly or masses felt. Normal bowel sounds heard. Central nervous system: Alert and oriented. No focal neurological deficits. Extremities: Symmetric 5 x 5 power. Skin: No rashes, lesions or ulcers Psychiatry: Judgement and insight appear normal. Mood & affect appropriate.   Data Reviewed: I have personally reviewed following labs and imaging studies  CBC: Recent Labs  Lab 02/24/20 1246  WBC 6.2  NEUTROABS 4.1  HGB 10.4*  HCT 32.7*  MCV 85.4  PLT 323   Basic Metabolic Panel: Recent Labs  Lab 02/24/20 1246  NA 139  K 3.8  CL 101  CO2 29  GLUCOSE 112*  BUN 17  CREATININE 0.73  CALCIUM 9.5   GFR: Estimated Creatinine Clearance: 45.4 mL/min (by C-G formula based on SCr of 0.73 mg/dL). Liver Function Tests: Recent Labs  Lab 02/24/20 1246  AST 17  ALT 11  ALKPHOS 78  BILITOT 0.8  PROT 7.5  ALBUMIN 3.9   CBG: Recent Labs  Lab 02/25/20 0736 02/25/20 1127 02/25/20 1652  GLUCAP 71 142* 149*     Recent Results (from the past 240 hour(s))  SARS Coronavirus 2 by RT PCR (hospital order, performed in [facility] hospital lab) Nasopharyngeal Nasopharyngeal Swab     Status: None   Collection Time: 02/24/20 11:35 PM   Specimen: Nasopharyngeal Swab  Result Value Ref Range Status   SARS Coronavirus 2 NEGATIVE NEGATIVE Final    Comment: (NOTE) SARS-CoV-2 target nucleic acids are NOT DETECTED.  The SARS-CoV-2 RNA is generally detectable in upper and lower respiratory specimens during the acute phase of infection. The lowest concentration of SARS-CoV-2 viral copies this assay can detect is 250 copies / mL. A negative result does not preclude SARS-CoV-2  infection and should not be used as the sole basis for treatment or other patient management decisions.  A negative result may occur with improper specimen collection / handling, submission of specimen other than nasopharyngeal swab, presence of viral mutation(s) within the areas targeted by this assay, and inadequate number of viral copies (<250 copies / mL). A negative result must be combined with clinical observations, patient history, and epidemiological information.  Fact Sheet for Patients:   StrictlyIdeas.no  Fact Sheet for Healthcare Providers: BankingDealers.co.za  This test is not yet approved or  cleared by the [place] FDA and has been authorized for detection and/or diagnosis of SARS-CoV-2 by FDA under an Emergency Use Authorization (EUA).  This EUA will remain in effect (meaning this test can be used) for the duration of the COVID-19 declaration under Section 564(b)(1) of the Act, 21 U.S.C. section 360bbb-3(b)(1), unless the authorization is terminated or revoked sooner.  Performed at [facility], [address] [place], [place] [address]     Radiology Studies: DG Chest 2 View  Result Date: 02/24/2020  CLINICAL DATA:  Onset shortness of breath when the patient woke up this morning. EXAM: CHEST - 2 VIEW COMPARISON:  PA and lateral chest 02/13/2020. FINDINGS: Small bilateral pleural effusions and mild basilar atelectasis are again seen. The lungs are otherwise clear. Cardiomegaly. Pacing device is unchanged. Loop recorder is in place. The patient is status post median sternotomy. No acute or focal bony abnormality. IMPRESSION: No change in very small bilateral pleural effusions and basilar atelectasis since the most recent exam. Electronically Signed   By: Inge Rise M.D.   On: 02/24/2020 14:29   US Venous Img Lower Bilateral  Result Date: 02/25/2020 CLINICAL DATA:  Bilateral lower extremity edema EXAM:  BILATERAL LOWER EXTREMITY VENOUS DOPPLER ULTRASOUND TECHNIQUE: Gray-scale sonography with compression, as well as color and duplex ultrasound, were performed to evaluate the deep venous system(s) from the level of the common femoral vein through the popliteal and proximal calf veins. COMPARISON:  None. FINDINGS: VENOUS Normal compressibility of the common femoral, superficial femoral, and popliteal veins, as well as the visualized calf veins. Visualized portions of profunda femoral vein and great saphenous vein unremarkable. No filling defects to suggest DVT on grayscale or color Doppler imaging. Doppler waveforms show normal direction of venous flow, normal respiratory plasticity and response to augmentation. OTHER None. Limitations: The peroneal veins of the lower extremities bilaterally were not well visualized on this examination. IMPRESSION: Negative. Electronically Signed   By: Fidela Salisbury MD   On: 02/25/2020 01:19   Scheduled Meds: . apixaban  5 mg Oral BID  . carvedilol  6.25 mg Oral BID WC  . furosemide  40 mg Oral Daily  . insulin aspart  0-15 Units Subcutaneous TID WC  . insulin aspart  0-5 Units Subcutaneous QHS  . lisinopril  10 mg Oral Daily  . sodium chloride flush  3 mL Intravenous Q12H   Continuous Infusions: . sodium chloride       LOS: 1 day    Para Skeans, MD [facility] Pager [phone] If 7PM-7AM, please contact night-coverage [email or web address] Password [facility] 02/25/2020, 7:32 PM

## 2020-02-25 NOTE — ED Notes (Signed)
Pt has urinated in bed. Sheets changed, pt cleansed, fresh gown placed.

## 2020-02-25 NOTE — ED Notes (Signed)
MD at bedside. 

## 2020-02-25 NOTE — ED Notes (Signed)
Dr. Patel at bedside 

## 2020-02-26 ENCOUNTER — Other Ambulatory Visit: Payer: Self-pay

## 2020-02-26 ENCOUNTER — Encounter: Payer: Self-pay | Admitting: Internal Medicine

## 2020-02-26 LAB — COMPREHENSIVE METABOLIC PANEL
ALT: 12 U/L (ref 0–44)
AST: 16 U/L (ref 15–41)
Albumin: 3.6 g/dL (ref 3.5–5.0)
Alkaline Phosphatase: 72 U/L (ref 38–126)
Anion gap: 13 (ref 5–15)
BUN: 17 mg/dL (ref 8–23)
CO2: 31 mmol/L (ref 22–32)
Calcium: 9.5 mg/dL (ref 8.9–10.3)
Chloride: 93 mmol/L — ABNORMAL LOW (ref 98–111)
Creatinine, Ser: 0.73 mg/dL (ref 0.44–1.00)
GFR calc Af Amer: >60 mL/min (ref >60)
GFR calc non Af Amer: >60 mL/min (ref >60)
Glucose, Bld: 114 mg/dL — ABNORMAL HIGH (ref 70–99)
Potassium: 3.1 mmol/L — ABNORMAL LOW (ref 3.5–5.1)
Sodium: 137 mmol/L (ref 135–145)
Total Bilirubin: 0.8 mg/dL (ref 0.3–1.2)
Total Protein: 7 g/dL (ref 6.5–8.1)

## 2020-02-26 LAB — CBC
HCT: 31.6 % — ABNORMAL LOW (ref 36.0–46.0)
Hemoglobin: 10.4 g/dL — ABNORMAL LOW (ref 12.0–15.0)
MCH: 27.3 pg (ref 26.0–34.0)
MCHC: 32.9 g/dL (ref 30.0–36.0)
MCV: 82.9 fL (ref 80.0–100.0)
Platelets: 287 10*3/uL (ref 150–400)
RBC: 3.81 MIL/uL — ABNORMAL LOW (ref 3.87–5.11)
RDW: 16 % — ABNORMAL HIGH (ref 11.5–15.5)
WBC: 6.6 10*3/uL (ref 4.0–10.5)
nRBC: 0 % (ref 0.0–0.2)

## 2020-02-26 LAB — GLUCOSE, CAPILLARY
Glucose-Capillary: 110 mg/dL — ABNORMAL HIGH (ref 70–99)
Glucose-Capillary: 137 mg/dL — ABNORMAL HIGH (ref 70–99)
Glucose-Capillary: 134 mg/dL — ABNORMAL HIGH (ref 70–99)
Glucose-Capillary: 141 mg/dL — ABNORMAL HIGH (ref 70–99)

## 2020-02-26 LAB — MAGNESIUM: Magnesium: 1.8 mg/dL (ref 1.7–2.4)

## 2020-02-26 LAB — HEMOGLOBIN A1C
Hgb A1c MFr Bld: 6.4 % — ABNORMAL HIGH (ref 4.8–5.6)
Mean Plasma Glucose: 136.98 mg/dL

## 2020-02-26 LAB — PHOSPHORUS: Phosphorus: 4.2 mg/dL (ref 2.5–4.6)

## 2020-02-26 MED ORDER — POTASSIUM CHLORIDE CRYS ER 20 MEQ PO TBCR
20.0000 meq | EXTENDED_RELEASE_TABLET | Freq: Every day | ORAL | Status: DC
  Administered 2020-02-26 – 2020-02-27 (×2): 20 meq via ORAL
  Filled 2020-02-26 (×2): qty 1

## 2020-02-26 NOTE — Progress Notes (Addendum)
Patient Name: Amber [name] Date of Encounter: 02/26/2020  Hospital Problem List     Principal Problem:   Acute on chronic diastolic CHF (congestive heart failure) ([place]) Active Problems:   Essential hypertension   Acute respiratory failure with hypoxia (HCC)   CAD (coronary artery disease)   Type 2 diabetes mellitus with diabetic polyneuropathy, with long-term current use of insulin (HCC)   History of atrial fibrillation   Pacemaker    Patient Profile     86 y.o. female with history of HFpEF with known ejection fraction of approximately 55-60 % by echo done 2 months ago with LVH, moderate aortic stenosis with a valve area of 0.8 cm with a mean gradient of 12 mmHg, CAD status post coronary bypass grafting, diabetes, hypertension, history of a permanent pacemaker, history of paroxysmal atrial fibrillation anticoagulated with apixaban who presented to the emergency room with increasing shortness of breath with low pulse ox.  She admits to not taking her Lasix for quite some time due to the fact that it makes her urinate too much.  She has had 3-4 pillow orthopnea.  She also has been noncompliant with her apixaban per her report.  Unclear regarding compliance with the remainder of her medications.  Chest x-ray revealed very small bilateral pleural effusions.  No obvious severe pulmonary edema.  Lower extremity ultrasound revealed no evidence of DVT.  She is Covid negative.  Subjective   Less short of breath today  Inpatient Medications    . apixaban  5 mg Oral BID  . carvedilol  6.25 mg Oral BID WC  . furosemide  40 mg Oral Daily  . insulin aspart  0-15 Units Subcutaneous TID WC  . insulin aspart  0-5 Units Subcutaneous QHS  . lisinopril  10 mg Oral Daily  . sodium chloride flush  3 mL Intravenous Q12H    Vital Signs    Vitals:   02/25/20 2226 02/25/20 2230 02/26/20 0459 02/26/20 0723  BP: 124/80  114/60 138/60  Pulse: 62  60 66  Resp: 19  20   Temp: 98.5 F (36.9 C)   98.4 F (36.9 C) 98.7 F (37.1 C)  TempSrc: Oral  Oral Oral  SpO2: 100%  94% 99%  Weight:  75.5 kg 75.5 kg   Height:        Intake/Output Summary (Last 24 hours) at 02/26/2020 1042 Last data filed at 02/26/2020 0900 Gross per 24 hour  Intake 358 ml  Output 1250 ml  Net -892 ml   Filed Weights   02/24/20 1244 02/25/20 2230 02/26/20 0459  Weight: 80.3 kg 75.5 kg 75.5 kg    Physical Exam    GEN: Well nourished, well developed, in no acute distress.  HEENT: normal.  Neck: Supple, no JVD, carotid bruits, or masses. Cardiac: RRR, no murmurs, rubs, or gallops. No clubbing, cyanosis, edema.  Radials/DP/PT 2+ and equal bilaterally.  Respiratory:  Respirations regular and unlabored, clear to auscultation bilaterally. GI: Soft, nontender, nondistended, BS + x 4. MS: no deformity or atrophy. Skin: warm and dry, no rash. Neuro:  Strength and sensation are intact. Psych: Normal affect.  Labs    CBC Recent Labs    02/24/20 1246 02/26/20 0536  WBC 6.2 6.6  NEUTROABS 4.1  --   HGB 10.4* 10.4*  HCT 32.7* 31.6*  MCV 85.4 82.9  PLT 289 119   Basic Metabolic Panel Recent Labs    02/24/20 1246 02/26/20 0536  NA 139 137  K 3.8 3.1*  CL 101 93*  CO2 29 31  GLUCOSE 112* 114*  BUN 17 17  CREATININE 0.73 0.73  CALCIUM 9.5 9.5  MG  --  1.8  PHOS  --  4.2   Liver Function Tests Recent Labs    02/24/20 1246 02/26/20 0536  AST 17 16  ALT 11 12  ALKPHOS 78 72  BILITOT 0.8 0.8  PROT 7.5 7.0  ALBUMIN 3.9 3.6   No results for input(s): LIPASE, AMYLASE in the last 72 hours. Cardiac Enzymes No results for input(s): CKTOTAL, CKMB, CKMBINDEX, TROPONINI in the last 72 hours. BNP Recent Labs    02/24/20 2331  BNP 223.3*   D-Dimer No results for input(s): DDIMER in the last 72 hours. Hemoglobin A1C Recent Labs    02/25/20 0147  HGBA1C 6.4*   Fasting Lipid Panel No results for input(s): CHOL, HDL, LDLCALC, TRIG, CHOLHDL, LDLDIRECT in the last 72 hours. Thyroid  Function Tests Recent Labs    02/25/20 2236  TSH 2.691    Telemetry     Atrial fibrillation with ventricular paced rhythm  ECG    Atrial fibrillation with ventricular paced rhythm  Radiology    DG Chest 2 View  Result Date: 02/24/2020 CLINICAL DATA:  Onset shortness of breath when the patient woke up this morning. EXAM: CHEST - 2 VIEW COMPARISON:  PA and lateral chest 02/13/2020. FINDINGS: Small bilateral pleural effusions and mild basilar atelectasis are again seen. The lungs are otherwise clear. Cardiomegaly. Pacing device is unchanged. Loop recorder is in place. The patient is status post median sternotomy. No acute or focal bony abnormality. IMPRESSION: No change in very small bilateral pleural effusions and basilar atelectasis since the most recent exam. Electronically Signed   By: Inge Rise M.D.   On: 02/24/2020 14:29   DG Chest 2 View  Result Date: 02/13/2020 CLINICAL DATA:  Shortness of breath developing over the past 4 days. EXAM: CHEST - 2 VIEW COMPARISON:  PA and lateral chest 01/26/2017. FINDINGS: The patient has small bilateral pleural effusions and basilar atelectasis. The lungs are otherwise clear. No pulmonary edema. Heart size is upper normal. The patient is status post CABG with a pacing device in place. Atherosclerosis noted. No acute bony abnormality. IMPRESSION: Small bilateral pleural effusions and mild basilar atelectasis. Aortic Atherosclerosis (ICD10-I70.0). Electronically Signed   By: Inge Rise M.D.   On: 02/13/2020 14:28   US Venous Img Lower Bilateral  Result Date: 02/25/2020 CLINICAL DATA:  Bilateral lower extremity edema EXAM: BILATERAL LOWER EXTREMITY VENOUS DOPPLER ULTRASOUND TECHNIQUE: Gray-scale sonography with compression, as well as color and duplex ultrasound, were performed to evaluate the deep venous system(s) from the level of the common femoral vein through the popliteal and proximal calf veins. COMPARISON:  None. FINDINGS: VENOUS  Normal compressibility of the common femoral, superficial femoral, and popliteal veins, as well as the visualized calf veins. Visualized portions of profunda femoral vein and great saphenous vein unremarkable. No filling defects to suggest DVT on grayscale or color Doppler imaging. Doppler waveforms show normal direction of venous flow, normal respiratory plasticity and response to augmentation. OTHER None. Limitations: The peroneal veins of the lower extremities bilaterally were not well visualized on this examination. IMPRESSION: Negative. Electronically Signed   By: Fidela Salisbury MD   On: 02/25/2020 01:19    Assessment & Plan    86 y.o. female with history of HFpEF with known ejection fraction of approximately 55-60 % by echo done 2 months ago with LVH, moderate aortic stenosis with  a valve area of 0.8 cm with a mean gradient of 12 mmHg, CAD status post coronary bypass grafting, diabetes, hypertension, history of a permanent pacemaker, history of paroxysmal atrial fibrillation anticoagulated with apixaban who presented to the emergency room with increasing shortness of breath with low pulse ox.  She admits to not taking her Lasix for quite some time due to the fact that it makes her urinate too much.  CHF-has preserved LV function by echo done several months ago.  She was admitted with volume overload felt to be secondary to medication noncompliance including Lasix.  She states she was on twice daily furosemide at stopped it altogether due to frequent urination at night.  She also is reportedly on Entresto, carvedilol.  Will resume diuresis and she has diuresed approximately 2 L since discharge.  Stressed importance of compliance with the Lasix.    We'll continue with carvedilol 6.25 mg twice daily.    Will also continue with Lasix 40 mg q. a.m. lisinopril 10 mg every morning.  Low-sodium diet was recommended.  Atrial fibrillation-currently appears to be in A. fib with ventricular paced rhythm and in  good control. Will continue on Eliquis 5 mg twice daily. We'll continue with carvedilol at 6.25 mg twice daily.  She has a Arts administrator in place.  This was placed 01/24/2020.  Device appears to be functioning normally.  Permanent pacemaker.-Has leadless Micra device in place.  This was placed approximately 1 month ago.  Currently stable.  CAD-no evidence of ischemia at present.  We'll continue with current regimen.  Hypertension-we'll continue with lisinopril at 10 and continue with carvedilol following pressure.  Would ambulate this morning and if stable would consider discharge to home on aforementioned medications with outpatient follow-up with Dr. Nehemiah Massed in 1 week.  Compliance with medications was stressed again.  Her admission appears to be secondary to medication noncompliance.  Signed, Javier Docker Maitland Lesiak MD 02/26/2020, 10:42 AM  Pager: [phone]

## 2020-02-26 NOTE — Progress Notes (Addendum)
Pt was admitted on the floor with no signs of distress. Pt was only alert to self and place. VSS. Pt ascom within pt reach and pt was educated about safety. Pt screening was not completed.  Heart failure hand out was given to pt but will benefit with education if family will be there for pt during education. Will notify incoming shift. Will continue to monitor.

## 2020-02-26 NOTE — Plan of Care (Signed)
  Problem: Clinical Measurements: Goal: Respiratory complications will improve Outcome: Progressing   Problem: Pain Managment: Goal: General experience of comfort will improve Outcome: Progressing   Problem: Safety: Goal: Ability to remain free from injury will improve Outcome: Progressing   Problem: Education: Goal: Ability to demonstrate management of disease process will improve Outcome: Progressing

## 2020-02-26 NOTE — TOC Initial Note (Signed)
Transition of Care [facility]) - Initial/Assessment Note    Patient Details  Name: Amber [name] MRN: [id number] Date of Birth: 07-06-1930  [facility] of [facility] CM/SW Contact:    Victorino Dike, RN Phone Number: 02/26/2020, 2:34 PM  Clinical Narrative:                  Patient lives at home with her daughter [name] and her husband.  They help provide care for her.  She would like PT and OT services to continue after discharge.  She also needs a bedside commode.  This was ordered through [place].  Delivered to room today.  [facility] provides [place] services.   Expected Discharge Plan: [place] Barriers to Discharge: Continued Medical Work up   Patient Goals and CMS Choice Patient states their goals for this hospitalization and ongoing recovery are:: to return home with daughter CMS Medicare.gov Compare Post Acute Care list provided to:: Patient Choice offered to / list presented to : Patient, Adult Children  Expected Discharge Plan and Services Expected Discharge Plan: [place]   Discharge Planning Services: CM Consult Post Acute Care Choice: [place] arrangements for the past 2 months: Single Family Home                 DME Arranged: 3-N-1 DME Agency: Other - Comment [place]) Date DME Agency Contacted: 02/26/20 Time DME Agency Contacted: [phone] Representative spoke with at DME Agency: Brenton Grills HH Arranged: PT, OT [place] Agency: [facility] [place] Date [employer]: 02/26/20 Time [place]: 70 Representative spoke with at [place]: Tillie Rung  Prior Living Arrangements/Services Living arrangements for the past 2 months: [place] with:: Adult Children Patient language and need for interpreter reviewed:: Yes Do you feel safe going back to the place where you live?: Yes      Need for Family Participation in Patient Care: Yes (Comment) Care giver support system in place?: Yes (comment) Current home  services: DME (Cane and wheelchair) Criminal Activity/Legal Involvement Pertinent to Current Situation/Hospitalization: No - Comment as needed  Activities of Daily Living Home Assistive Devices/Equipment: Environmental consultant (specify type), CBG Meter (shower chiar) ADL Screening (condition at time of admission) Patient's cognitive ability adequate to safely complete daily activities?: No Is the patient deaf or have difficulty hearing?: No Does the patient have difficulty seeing, even when wearing glasses/contacts?: Yes Does the patient have difficulty concentrating, remembering, or making decisions?: Yes Patient able to express need for assistance with ADLs?: Yes Does the patient have difficulty dressing or bathing?: No Independently performs ADLs?: No Communication: Independent (But can be forgetful at times) Is this a change from baseline?: Pre-admission baseline Dressing (OT): Needs assistance Is this a change from baseline?: Pre-admission baseline Grooming: Needs assistance Is this a change from baseline?: Pre-admission baseline Feeding: Independent Bathing: Needs assistance Is this a change from baseline?: Pre-admission baseline Toileting: Needs assistance Is this a change from baseline?: Pre-admission baseline In/Out Bed: Needs assistance Is this a change from baseline?: Pre-admission baseline Does the patient have difficulty walking or climbing stairs?: Yes Weakness of Legs: Both Weakness of Arms/Hands: Both  Permission Sought/Granted                  Emotional Assessment       Orientation: : Oriented to Self, Oriented to Place, Oriented to Situation Alcohol / Substance Use: Not Applicable Psych Involvement: No (comment)  Admission diagnosis:  Hypoxia [R09.02] Acute CHF (congestive  heart failure) (HCC) [I50.9] Volume overload state of heart [E87.79] Patient Active Problem List   Diagnosis Date Noted  . History of atrial fibrillation 02/25/2020  . Pacemaker 02/25/2020  .  Acute on chronic diastolic CHF (congestive heart failure) ([place]) 02/25/2020  . Complete heart block ([place]) 01/24/2020  . Injury   . S/P ORIF (open reduction internal fixation) fracture   . Hypotension due to hypovolemia   . Closed displaced fracture of right tibial tuberosity   . Pain   . Syncope and collapse 12/06/2019  . Status post total hip replacement, left 02/09/2018  . Primary osteoarthritis of left hip 01/06/2018  . Chronic pain syndrome 04/09/2017  . Other specified health status 04/08/2017  . Disorder of bone, unspecified 04/08/2017  . Long term current use of opiate analgesic 04/08/2017  . Chronic low back pain (Primary Area of Pain) (Bilateral) (L>R) 04/08/2017  . Chronic pain of both lower extremities  (Tertiary Area of Pain) 04/08/2017  . Chronic groin pain (Secondary Area of Pain) 04/08/2017  . Chronic neck pain (Fourth Area of Pain) (Bilateral) (L>R) 04/08/2017  . Chronic sacroiliac joint pain 04/08/2017  . Other long term (current) drug therapy 04/08/2017  . Syncope 02/09/2017  . Asthmatic bronchitis 01/26/2017  . Fall 10/06/2016  . Anxiety 08/08/2016  . Acute respiratory failure with hypoxia ([place]) 07/26/2016  . Acute on chronic systolic CHF (congestive heart failure) ([place]) 07/26/2016  . TIA (transient ischemic attack) 07/25/2015  . Sick sinus syndrome ([place]) 03/14/2015  . CAD (coronary artery disease) 02/23/2015  . Chronic heart failure ([place]) 01/05/2015  . Fracture dislocation of ankle 07/27/2014  . IDDM (insulin dependent diabetes mellitus) 07/27/2014  . Essential hypertension 07/27/2014  . Multiple rib fractures 07/27/2014  . 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 Anemia 12/06/2013  . Type 2 diabetes mellitus with diabetic polyneuropathy, with long-term current use of insulin ([place]) 12/06/2013   PCP:  Idelle Crouch, MD Pharmacy:   [facility], [place] - [address] [place] [address] Phone: [phone] Fax:  [phone]     Social Determinants of Health (SDOH) Interventions    Readmission Risk Interventions No flowsheet data found.

## 2020-02-26 NOTE — Evaluation (Signed)
Physical Therapy Evaluation Patient Details Name: Amber [name] MRN: [id number] DOB: [birth year] Today's Date: 02/26/2020   History of Present Illness  Pt is a 86 y.o. female w/ PMH of dCHF, CAD s/p CABG, DM, HTN, PPM, afib, R THR, R TKR, R ankle ORIF 2016, R tibia ORIF 12/08/2019, and leadless pacemaker insertion 01/24/2020. Per MD impression, pt currently presents with: acute on chronic dCHF and acute respiratory failure w/ hypoxia.  Clinical Impression  Pt was pleasant and motivated to participate during the session. Pt somewhat poor historian, unable to provide clear hx; discussed hx w/ daughter via phone. Pt primarily uses a WC at baseline, able to walk limited household distances w/ RW; has significant fall hx. Pt found in supine on 2L O2 this session; SpO2 measured as 98%. Per nursing, okay to trial pt on room air; pt on room air for remainder of session. Pt able to perform supine and seated therex w/ min instructional cueing and no physical assist. Pt demonstrated mod-I w/ all bed mobility and mod use of rails. After performing supine-sit, pt reported some dizziness; SpO2 measured as 92%. Dizziness resolved and pt able to perform sit-to-stand to RW w/ CGA; pt again reported dizziness w/ positional change and quickly returned self to sit w/ SpO2 91%. After rest and resolution of sx; pt's BP measured as 156/64 in sitting. Pt returned to stand and again reported dizziness; BP measured in standing as 120/100 at this time. Pt side-stepped ~107ft to [facility] and returned to supine. SpO2 92% in supine, but dropped to 87-88% with brief review of supine therex; quickly returned to 93-94% w/ rest. Nursing notified and pt left on room air. Per hx via daughter, pt appears to be close to baseline level of function. Pt will benefit from HHPT services upon discharge to safely address deficits listed in patient problem list for decreased caregiver assistance and eventual return to PLOF.     Follow Up Recommendations  Home health PT;Supervision/Assistance - 24 hour (pt currently recieving HHPT)    Equipment Recommendations  None recommended by PT    Recommendations for Other Services       Precautions / Restrictions Precautions Precautions: Fall Restrictions Weight Bearing Restrictions: No      Mobility  Bed Mobility Overal bed mobility: Modified Independent             General bed mobility comments: extra time and effort  Transfers Overall transfer level: Needs assistance Equipment used: Rolling walker (2 wheeled) Transfers: Sit to/from Stand Sit to Stand: Min guard         General transfer comment: Inc time and effort; pt reported dizziness w/ positional changes; close CGA  Ambulation/Gait Ambulation/Gait assistance: Min guard Gait Distance (Feet): 2 Feet Assistive device: Rolling walker (2 wheeled) Gait Pattern/deviations: Step-to pattern     General Gait Details: side-steps up to [facility]; short, slow, steps w/ no LOB noted  Stairs            Wheelchair Mobility    Modified Rankin (Stroke Patients Only)       Balance Overall balance assessment: Needs assistance Sitting-balance support: Feet unsupported;Single extremity supported Sitting balance-Leahy Scale: Good     Standing balance support: Bilateral upper extremity supported;During functional activity Standing balance-Leahy Scale: Fair Standing balance comment: limited assessment 2/2 dizziness w/ position changes                             Pertinent Vitals/Pain Pain  Assessment: No/denies pain    Home Living Family/patient expects to be discharged to:: Private residence Living Arrangements: Children Available Help at Discharge: Family;Available 24 hours/day Type of Home: Mobile home Home Access: Stairs to enter;Ramped entrance Entrance Stairs-Rails: Left;Right;Can reach both Entrance Stairs-Number of Steps: 1 Home Layout: One level Home Equipment: Bedside commode;Shower  seat;Wheelchair - manual;Cane - single point      Prior Function Level of Independence: Needs assistance         Comments: Pt generally uses a WC for household ambulation. Per phone call w/ daughter, pt does not get out of bed often to be in [facility]. BSC kept at bedside for easy access. Daughter states pt is able to perform bed mobility and transfers w/ SBA and that pt often gets dizzy once standing. Multiple falls recently, last one occured on Monday. Daughter states that pt able to walk limited household distances w/ 2WW.     Hand Dominance   Dominant Hand: Right    Extremity/Trunk Assessment   Upper Extremity Assessment Upper Extremity Assessment: Generalized weakness    Lower Extremity Assessment Lower Extremity Assessment: Generalized weakness       Communication   Communication: No difficulties  Cognition Arousal/Alertness: Awake/alert Behavior During Therapy: WFL for tasks assessed/performed Overall Cognitive Status: No family/caregiver present to determine baseline cognitive functioning                                 General Comments: Generally poor historian; difficult to get a clear hx from pt. Stated things such as "I live with my husband" followed up by "my husband is deceased" as well as adamant statement that she is 55ft even (pt is 4'11").  Phone call w/ daughter to confirm pt hx.      General Comments      Exercises Total Joint Exercises Ankle Circles/Pumps: AROM;Strengthening;Both;10 reps;15 reps Quad Sets: Strengthening;Both;10 reps Gluteal Sets: Strengthening;Both;10 reps Hip ABduction/ADduction: AROM;Strengthening;Both;10 reps Straight Leg Raises: AROM;Strengthening;Both;10 reps Long Arc Quad: AROM;Strengthening;Both;10 reps;15 reps Knee Flexion: AROM;Strengthening;Both;10 reps;15 reps   Assessment/Plan    PT Assessment Patient needs continued PT services  PT Problem List Decreased strength;Decreased activity tolerance;Decreased  balance;Decreased knowledge of use of DME;Decreased mobility       PT Treatment Interventions DME instruction;Gait training;Functional mobility training;Therapeutic activities;Patient/family education;Balance training;Therapeutic exercise    PT Goals (Current goals can be found in the Care Plan section)  Acute Rehab PT Goals PT Goal Formulation: Patient unable to participate in goal setting    Frequency Min 2X/week   Barriers to discharge        Co-evaluation               AM-PAC PT "6 Clicks" Mobility  Outcome Measure Help needed turning from your back to your side while in a flat bed without using bedrails?: A Little Help needed moving from lying on your back to sitting on the side of a flat bed without using bedrails?: A Little Help needed moving to and from a bed to a chair (including a wheelchair)?: A Little Help needed standing up from a chair using your arms (e.g., wheelchair or bedside chair)?: A Little Help needed to walk in hospital room?: A Lot Help needed climbing 3-5 steps with a railing? : Total 6 Click Score: 15    End of Session Equipment Utilized During Treatment: Gait belt Activity Tolerance: Patient tolerated treatment well Patient left: in bed;with call bell/phone within reach;with  bed alarm set Nurse Communication: Mobility status;Other (comment) (SpO2 and BP w/ activity; needs new purewick) PT Visit Diagnosis: Unsteadiness on feet (R26.81);Muscle weakness (generalized) (M62.81);History of falling (Z91.81);Difficulty in walking, not elsewhere classified (R26.2);Repeated falls (R29.6)    Time: 1720-9106 PT Time Calculation (min) (ACUTE ONLY): 41 min   Charges:             Akera Snowberger SPT 02/26/20, 1:03 PM

## 2020-02-27 LAB — GLUCOSE, CAPILLARY
Glucose-Capillary: 144 mg/dL — ABNORMAL HIGH (ref 70–99)
Glucose-Capillary: 130 mg/dL — ABNORMAL HIGH (ref 70–99)

## 2020-02-27 MED ORDER — FUROSEMIDE 20 MG PO TABS: 20.0000 mg | ORAL_TABLET | Freq: Two times a day (BID) | ORAL | 0 refills | Status: AC

## 2020-02-27 MED ORDER — LISINOPRIL 20 MG PO TABS: 20.0000 mg | ORAL_TABLET | Freq: Every day | ORAL | 0 refills | Status: DC

## 2020-02-27 NOTE — Care Management Important Message (Signed)
Important Message  Patient Details  Name: Amber [name] MRN: [id number] Date of Birth: 01-16-1930   Medicare Important Message Given:  Yes     Dannette Barbara 02/27/2020, 12:56 PM

## 2020-02-27 NOTE — TOC Transition Note (Signed)
Transition of Care [facility]) - CM/SW Discharge Note   Patient Details  Name: Amber [name] MRN: [id number] Date of Birth: 1930/03/03  Transition of Care [facility]) CM/SW Contact:  Victorino Dike, RN Phone Number: 02/27/2020, 1:41 PM   Clinical Narrative:     Patient discharging home with [facility] services provided by [facility].  BSC was delivered to room. No further TOC needs at this time, please re-consult for new needs.       Barriers to Discharge: Continued Medical Work up   Patient Goals and CMS Choice Patient states their goals for this hospitalization and ongoing recovery are:: to return home with daughter CMS Medicare.gov Compare Post Acute Care list provided to:: Patient Choice offered to / list presented to : Patient, Adult Children  Discharge Placement                       Discharge Plan and Services   Discharge Planning Services: CM Consult Post Acute Care Choice: Home Health          DME Arranged: 3-N-1 DME Agency: Other - Comment [facility]) Date DME Agency Contacted: 02/26/20 Time DME Agency Contacted: [phone] Representative spoke with at DME Agency: [facility] HH Arranged: PT, OT [place] Agency: [facility] [place] Date [place]: 02/26/20 Time [place]: [place] Representative spoke with at [place]: [place] ([place]) Interventions     Readmission Risk Interventions No flowsheet data found.

## 2020-02-27 NOTE — Progress Notes (Signed)
Patient Name: Amber [name] Date of Encounter: 02/27/2020  Hospital Problem List     Principal Problem:   Acute on chronic diastolic CHF (congestive heart failure) ([place]) Active Problems:   Essential hypertension   Acute respiratory failure with hypoxia (HCC)   CAD (coronary artery disease)   Type 2 diabetes mellitus with diabetic polyneuropathy, with long-term current use of insulin (HCC)   History of atrial fibrillation   Pacemaker    Patient Profile        86 y.o.femalewith history ofHFpEF with known ejection fraction of approximately 55-60 % by echo done 2 months ago with LVH, moderate aortic stenosis with a valve area of 0.8 cm with a mean gradient of 12 mmHg, CAD status post coronary bypass grafting, diabetes, hypertension, history of a permanent pacemaker, history of paroxysmal atrial fibrillation anticoagulated with apixaban who presented to the emergency room with increasing shortness of breath with low pulse ox. She admits to not taking her Lasix for quite some time due to the fact that it makes her urinate too much. She has had 3-4 pillow orthopnea. She also has been noncompliant with her apixaban per her report. Unclear regarding compliance with the remainder of her medications. Chest x-ray revealed very small bilateral pleural effusions. No obvious severe pulmonary edema.  Subjective   Still less sob  Inpatient Medications    . apixaban  5 mg Oral BID  . carvedilol  6.25 mg Oral BID WC  . furosemide  40 mg Oral Daily  . insulin aspart  0-15 Units Subcutaneous TID WC  . insulin aspart  0-5 Units Subcutaneous QHS  . lisinopril  10 mg Oral Daily  . potassium chloride  20 mEq Oral Daily  . sodium chloride flush  3 mL Intravenous Q12H    Vital Signs    Vitals:   02/26/20 1637 02/26/20 2148 02/26/20 2149 02/27/20 0502  BP:  (!) 127/59 (!) 127/59 (!) 128/47  Pulse: 63 76 75 (!) 59  Resp:      Temp:  98.8 F (37.1 C) 98.8 F (37.1 C) 99 F (37.2  C)  TempSrc:  Oral Oral Oral  SpO2: 94% 96% 95% 95%  Weight:    74.4 kg  Height:        Intake/Output Summary (Last 24 hours) at 02/27/2020 0719 Last data filed at 02/26/2020 2347 Gross per 24 hour  Intake 600 ml  Output 101 ml  Net 499 ml   Filed Weights   02/25/20 2230 02/26/20 0459 02/27/20 0502  Weight: 75.5 kg 75.5 kg 74.4 kg    Physical Exam    GEN: Well nourished, well developed, in no acute distress.  HEENT: normal.  Neck: Supple, no JVD, carotid bruits, or masses. Cardiac: RRR, no murmurs, rubs, or gallops. No clubbing, cyanosis, edema.  Radials/DP/PT 2+ and equal bilaterally.  Respiratory:  Respirations regular and unlabored, clear to auscultation bilaterally. GI: Soft, nontender, nondistended, BS + x 4. MS: no deformity or atrophy. Skin: warm and dry, no rash. Neuro:  Strength and sensation are intact. Psych: Normal affect.  Labs    CBC Recent Labs    02/24/20 1246 02/26/20 0536  WBC 6.2 6.6  NEUTROABS 4.1  --   HGB 10.4* 10.4*  HCT 32.7* 31.6*  MCV 85.4 82.9  PLT 289 161   Basic Metabolic Panel Recent Labs    02/24/20 1246 02/26/20 0536  NA 139 137  K 3.8 3.1*  CL 101 93*  CO2 29 31  GLUCOSE  112* 114*  BUN 17 17  CREATININE 0.73 0.73  CALCIUM 9.5 9.5  MG  --  1.8  PHOS  --  4.2   Liver Function Tests Recent Labs    02/24/20 1246 02/26/20 0536  AST 17 16  ALT 11 12  ALKPHOS 78 72  BILITOT 0.8 0.8  PROT 7.5 7.0  ALBUMIN 3.9 3.6   No results for input(s): LIPASE, AMYLASE in the last 72 hours. Cardiac Enzymes No results for input(s): CKTOTAL, CKMB, CKMBINDEX, TROPONINI in the last 72 hours. BNP Recent Labs    02/24/20 2331  BNP 223.3*   D-Dimer No results for input(s): DDIMER in the last 72 hours. Hemoglobin A1C Recent Labs    02/25/20 0147  HGBA1C 6.4*   Fasting Lipid Panel No results for input(s): CHOL, HDL, LDLCALC, TRIG, CHOLHDL, LDLDIRECT in the last 72 hours. Thyroid Function Tests Recent Labs     02/25/20 2236  TSH 2.691    Telemetry    afib with v pace  ECG    afib with v pace  Radiology    DG Chest 2 View  Result Date: 02/24/2020 CLINICAL DATA:  Onset shortness of breath when the patient woke up this morning. EXAM: CHEST - 2 VIEW COMPARISON:  PA and lateral chest 02/13/2020. FINDINGS: Small bilateral pleural effusions and mild basilar atelectasis are again seen. The lungs are otherwise clear. Cardiomegaly. Pacing device is unchanged. Loop recorder is in place. The patient is status post median sternotomy. No acute or focal bony abnormality. IMPRESSION: No change in very small bilateral pleural effusions and basilar atelectasis since the most recent exam. Electronically Signed   By: Inge Rise M.D.   On: 02/24/2020 14:29   DG Chest 2 View  Result Date: 02/13/2020 CLINICAL DATA:  Shortness of breath developing over the past 4 days. EXAM: CHEST - 2 VIEW COMPARISON:  PA and lateral chest 01/26/2017. FINDINGS: The patient has small bilateral pleural effusions and basilar atelectasis. The lungs are otherwise clear. No pulmonary edema. Heart size is upper normal. The patient is status post CABG with a pacing device in place. Atherosclerosis noted. No acute bony abnormality. IMPRESSION: Small bilateral pleural effusions and mild basilar atelectasis. Aortic Atherosclerosis (ICD10-I70.0). Electronically Signed   By: Inge Rise M.D.   On: 02/13/2020 14:28   US Venous Img Lower Bilateral  Result Date: 02/25/2020 CLINICAL DATA:  Bilateral lower extremity edema EXAM: BILATERAL LOWER EXTREMITY VENOUS DOPPLER ULTRASOUND TECHNIQUE: Gray-scale sonography with compression, as well as color and duplex ultrasound, were performed to evaluate the deep venous system(s) from the level of the common femoral vein through the popliteal and proximal calf veins. COMPARISON:  None. FINDINGS: VENOUS Normal compressibility of the common femoral, superficial femoral, and popliteal veins, as well as the  visualized calf veins. Visualized portions of profunda femoral vein and great saphenous vein unremarkable. No filling defects to suggest DVT on grayscale or color Doppler imaging. Doppler waveforms show normal direction of venous flow, normal respiratory plasticity and response to augmentation. OTHER None. Limitations: The peroneal veins of the lower extremities bilaterally were not well visualized on this examination. IMPRESSION: Negative. Electronically Signed   By: Fidela Salisbury MD   On: 02/25/2020 01:19    Assessment & Plan      86 y.o.femalewith history ofHFpEF with known ejection fraction of approximately 55-60 % by echo done 2 months ago with LVH, moderate aortic stenosis with a valve area of 0.8 cm with a mean gradient of 12 mmHg, CAD status  post coronary bypass grafting, diabetes, hypertension, history of a permanent pacemaker, history of paroxysmal atrial fibrillation anticoagulated with apixaban who presented to the emergency room with increasing shortness of breath with low pulse ox. She admits to not taking her Lasix for quite some time due to the fact that it makes her urinate too much.  CHF-has preserved LV function by echo done several months ago.  She was admitted with volume overload felt to be secondary to medication noncompliance including Lasix. She states she was on twice daily furosemide at stopped it altogether due to frequent urination at night. She also is reportedly on Entresto, carvedilol. Will resume diuresis and she has diuresed approximately 2 L since discharge. Stressed importance of compliance with the Lasix.  We'll continue with carvedilol 6.25 mg twice daily.   Will also continue with Lasix 40 mg q. a.m. lisinopril 10 mg every morning.  Low-sodium diet was recommended. Would defer Entresto for now due to cost and compliance issues.   Atrial fibrillation-currently appears to be in A. fib with ventricular paced rhythm and in good control.Will continue on  Eliquis 5 mg twice daily.We'll continue with carvedilol at 6.25 mg twice daily.  She has a Arts administrator in place. This was placed 01/24/2020.  Device appears to be functioning normally.  Permanent pacemaker.-Has leadless Micra device in place. This was placed approximately 1 month ago. Currently stable.  CAD-no evidence of ischemia at present. We'll continue with current regimen.  Hypertension-we'll continue with lisinopril at 10 and continue with carvedilol following pressure.    Signed, Javier Docker Sigrid Schwebach MD 02/27/2020, 7:19 AM  Pager: [phone]

## 2020-02-27 NOTE — Plan of Care (Signed)
  Problem: Clinical Measurements: Goal: Respiratory complications will improve Outcome: Progressing   Problem: Activity: Goal: Risk for activity intolerance will decrease Outcome: Progressing   Problem: Safety: Goal: Ability to remain free from injury will improve Outcome: Progressing   

## 2020-02-27 NOTE — Discharge Summary (Signed)
Physician Discharge Summary  [name] OJJ:[id number] DOB: 07/30/1929 DOA: 02/24/2020  PCP: Amber Crouch, MD  Admit date: 02/24/2020 Discharge date: 02/27/2020  Time spent: 35 minutes   Discharge Diagnoses:  Principal Problem:   Acute on chronic diastolic CHF (congestive heart failure) ([place]) Active Problems:   Essential hypertension   Acute respiratory failure with hypoxia (HCC)   CAD (coronary artery disease)   Type 2 diabetes mellitus with diabetic polyneuropathy, with long-term current use of insulin (HCC)   History of atrial fibrillation   Pacemaker Acute on chronic diastolic CHF (congestive heart failure) (HCC) -IV Lasix, metoprolol and lisinopril -Likely related to noncompliance with Lasix -Daily weights with intake and output monitoring -Last echocardiogram in June 2021 with EF 55 to 60% -Cardiology consulted and greatly appreciate management and careplan.  - restart lasix/ lisinopril/ coreg.  Acute respiratory failure with hypoxia (HCC) -O2 sat while in physical therapy, with exertion 70s to 80s percent on room air, 87-88 in the ER -O2 sat to keep sats over 92% -Likely secondary to CHF. -ambulating pulse ox over 5 minutes with Rolator walker and no assist her o2 sats stayed above 91% at RA.   Essential hypertension -Continue Coreg/lisinopril and amlodipine.  -salt restricted diet.   CAD (coronary artery disease) -No complaints of chest pain, EKG nonacute, troponin was negative -Cont eliquis/ coreg.  Type 2 diabetes mellitus with diabetic polyneuropathy, with long-term current use of insulin  -Sliding scale insulin and resume home lantus dose. Hba1c is well controlled at 6.7.   History of atrial fibrillation -- In sinus rhythm -Continue apixaban  Pacemaker -Patient recently saw her cardiologist as outpatient. Per cardiology.   Discharge Condition:  Good.  Diet recommendation:  1800 mg sodium diet/ heart healthy/ low  fat/ carbohydrate consistent diet.   Filed Weights   02/25/20 2230 02/26/20 0459 02/27/20 0502  Weight: 75.5 kg 75.5 kg 74.4 kg    History of present illness:  Amber [name] is a 86 y.o. female with medical history significant for  dCHF, , coronary artery disease status post CABG, diabetes, hypertension,  PPM, paroxysmal A. fib on anticoagulation with apixaban, followed by Dr. Nehemiah [name], who presents to the emergency room with shortness of breath and reportedly low oxygen saturation in the 70s to 80s noticed while at physical therapy earlier in the day for which she was sent to the emergency room for evaluation. Patient presented to the emergency room with similar concerns a week prior but left without being seen due to long wait time. Patient also reports lower extremity edema and 3-4 pillow orthopnea over the past 2 weeks. She admits to noncompliance with Lasix because it keeps her up at night going to the bathroom every 1 hour. She also reports recently running out of her apixaban.On arrival in the emergency room, O2 sats were 90% on room air going as low as 87% with exertion with otherwise normal vitals except for slightly elevated blood pressure. Blood work significant for BNP of 223 but with troponin of 5 and otherwise blood work at baseline. Covid PCR negative. EKG showing ventricular paced rhythm chest x-ray showed no change when compared to PA and lateral chest from 02/13/2020 and with very small bilateral pleural effusions. Patient given IV Lasix. Hospitalist consulted for admission. Patient did have lower extremity Doppler of the right leg which she had orthopedic surgery, that was negative for DVT   Hospital Course:  Pt admitted for DOE attributed to CHF exacerbation and pleural effusion due  to  CHF.  She does have history of HFpEF with known ejection fraction of approximately 55-60 % by echo done 2 months ago with LVH, CAD status post coronary bypass grafting, diabetes,and  history of a  permanent pacemaker, history of paroxysmal atrial fibrillation anticoagulated with apixaban that pt has been noncomplaint with.  Pt started on Anticoagulation for her PAF and plan is to restart her lasix, eliquis,lisinopril and coreg and f/u with cardiology.   Procedures: None Consultations: Cardiology-Dr.Harding.   Discharge Exam: Vitals:   02/27/20 0733 02/27/20 1127  BP: (!) 128/92 (!) 153/85  Pulse: 91 88  Resp: 17 18  Temp: 98.5 F (36.9 C) 98.4 F (36.9 C)  SpO2: 96% 97%    Physical Exam Vitals reviewed.  Constitutional:      Appearance: She is obese.  HENT:     Head: Normocephalic and atraumatic.  Eyes:     Extraocular Movements: Extraocular movements intact.  Cardiovascular:     Rate and Rhythm: Normal rate and regular rhythm.  Pulmonary:     Effort: Pulmonary effort is normal.     Breath sounds: Normal breath sounds.  Abdominal:     General: There is no distension.     Palpations: Abdomen is soft. There is no mass.     Tenderness: There is no abdominal tenderness.  Skin:    General: Skin is warm.  Neurological:     General: No focal deficit present.     Mental Status: She is alert and oriented to person, place, and time.  Psychiatric:        Mood and Affect: Mood normal.        Behavior: Behavior normal.    Discharge Instructions   Discharge Instructions    (HEART FAILURE PATIENTS) Call MD:  Anytime you have any of the following symptoms: 1) 3 pound weight gain in 24 hours or 5 pounds in 1 week 2) shortness of breath, with or without a dry hacking cough 3) swelling in the hands, feet or stomach 4) if you have to sleep on extra pillows at night in order to breathe.   Complete by: As directed    AMB referral to CHF clinic   Complete by: As directed    Call MD for:  difficulty breathing, headache or visual disturbances   Complete by: As directed    Call MD for:  difficulty breathing, headache or visual disturbances   Complete by: As directed    Call  MD for:  extreme fatigue   Complete by: As directed    Call MD for:  extreme fatigue   Complete by: As directed    Call MD for:  hives   Complete by: As directed    Call MD for:  hives   Complete by: As directed    Call MD for:  persistant dizziness or light-headedness   Complete by: As directed    Call MD for:  persistant dizziness or light-headedness   Complete by: As directed    Call MD for:  persistant nausea and vomiting   Complete by: As directed    Call MD for:  persistant nausea and vomiting   Complete by: As directed    Call MD for:  redness, tenderness, or signs of infection (pain, swelling, redness, odor or green/yellow discharge around incision site)   Complete by: As directed    Call MD for:  severe uncontrolled pain   Complete by: As directed    Call MD for:  severe uncontrolled  pain   Complete by: As directed    Call MD for:  temperature >100.4   Complete by: As directed    Call MD for:  temperature >100.4   Complete by: As directed    Diet - low sodium heart healthy   Complete by: As directed    Diet Carb Modified   Complete by: As directed    Discharge instructions   Complete by: As directed    DAILY WEIGHTS. DAILY SODIUM COUNTING  TO CONSUME NOTE MORE THAN 1800 MG OF SODIUM PER DAY.   Increase activity slowly   Complete by: As directed    Increase activity slowly   Complete by: As directed      Allergies as of 02/27/2020      Reactions   Ambien [zolpidem Tartrate] Other (See Comments)   Reaction:  Keeps pt awake    Penicillins Itching, Other (See Comments)   Has patient had a PCN reaction causing immediate rash, facial/tongue/throat swelling, SOB or lightheadedness with hypotension: No Has patient had a PCN reaction causing severe rash involving mucus membranes or skin necrosis: No Has patient had a PCN reaction that required hospitalization No Has patient had a PCN reaction occurring within the last 10 years: No If all of the above answers are "NO",  then may proceed with Cephalosporin use.   Iodine Itching   Succinylcholine Other (See Comments)   Reaction:  Unknown    Etodolac Other (See Comments)   GI upset   Nsaids Other (See Comments)   Reaction: gi upset      Medication List    STOP taking these medications   Entresto 24-26 MG Generic drug: sacubitril-valsartan     TAKE these medications   acetaminophen 500 MG tablet Commonly known as: TYLENOL Take 1,000 mg by mouth every 6 (six) hours as needed for mild pain or headache.   albuterol 108 (90 Base) MCG/ACT inhaler Commonly known as: VENTOLIN HFA Inhale 2 puffs into the lungs every 6 (six) hours as needed for wheezing or shortness of breath.   ALPRAZolam 1 MG tablet Commonly known as: XANAX Take 1 tablet (1 mg total) by mouth 2 (two) times daily as needed for anxiety or sleep. What changed: when to take this   amLODipine 5 MG tablet Commonly known as: NORVASC Take 5 mg by mouth daily.   calcium-vitamin D 500-200 MG-UNIT tablet Commonly known as: OSCAL WITH D Take 1 tablet by mouth daily.   carvedilol 6.25 MG tablet Commonly known as: COREG Take 1 tablet (6.25 mg total) by mouth 2 (two) times daily with a meal.   Eliquis 5 MG Tabs tablet Generic drug: apixaban Take 5 mg by mouth 2 (two) times daily.   furosemide 20 MG tablet Commonly known as: Lasix Take 1 tablet (20 mg total) by mouth 2 (two) times daily.   gabapentin 100 MG capsule Commonly known as: NEURONTIN Take 200 mg by mouth 3 (three) times daily.   insulin glargine 100 UNIT/ML injection Commonly known as: LANTUS Inject 22 Units into the skin daily.   levETIRAcetam 500 MG tablet Commonly known as: KEPPRA Take 500 mg by mouth 2 (two) times daily.   lisinopril 20 MG tablet Commonly known as: ZESTRIL Take 1 tablet (20 mg total) by mouth daily.   omeprazole 40 MG capsule Commonly known as: PRILOSEC Take 40 mg by mouth daily.   pravastatin 40 MG tablet Commonly known as: PRAVACHOL Take  40 mg by mouth at bedtime.   PreserVision AREDS  2 Caps Take 1 capsule by mouth daily.            Durable Medical Equipment  (From admission, onward)         Start     Ordered   02/26/20 1428  For home use only DME 3 n 1  Once        02/26/20 1427         Allergies  Allergen Reactions  . Ambien [Zolpidem Tartrate] Other (See Comments)    Reaction:  Keeps pt awake   . Penicillins Itching and Other (See Comments)    Has patient had a PCN reaction causing immediate rash, facial/tongue/throat swelling, SOB or lightheadedness with hypotension: No Has patient had a PCN reaction causing severe rash involving mucus membranes or skin necrosis: No Has patient had a PCN reaction that required hospitalization No Has patient had a PCN reaction occurring within the last 10 years: No If all of the above answers are "NO", then may proceed with Cephalosporin use.  . Iodine Itching  . Succinylcholine Other (See Comments)    Reaction:  Unknown   . Etodolac Other (See Comments)    GI upset  . Nsaids Other (See Comments)    Reaction: gi upset    Follow-up Information    [facility] Follow up on 03/08/2020.   Specialty: Cardiology Why: at 12:00pm. Enter through the [place] entrance Contact information: [address] [place] [phone]       Corey Skains, MD On 03/05/2020.   Specialty: Cardiology Why: Appointment at 1:45pm Contact information: [address] [facility] [place] [address] [phone]                The results of significant diagnostics from this hospitalization (including imaging, microbiology, ancillary and laboratory) are listed below for reference.    Significant Diagnostic Studies: DG Chest 2 View  Result Date: 02/24/2020 CLINICAL DATA:  Onset shortness of breath when the patient woke up this morning. EXAM: CHEST - 2 VIEW  COMPARISON:  PA and lateral chest 02/13/2020. FINDINGS: Small bilateral pleural effusions and mild basilar atelectasis are again seen. The lungs are otherwise clear. Cardiomegaly. Pacing device is unchanged. Loop recorder is in place. The patient is status post median sternotomy. No acute or focal bony abnormality. IMPRESSION: No change in very small bilateral pleural effusions and basilar atelectasis since the most recent exam. Electronically Signed   By: Inge Rise M.D.   On: 02/24/2020 14:29   DG Chest 2 View  Result Date: 02/13/2020 CLINICAL DATA:  Shortness of breath developing over the past 4 days. EXAM: CHEST - 2 VIEW COMPARISON:  PA and lateral chest 01/26/2017. FINDINGS: The patient has small bilateral pleural effusions and basilar atelectasis. The lungs are otherwise clear. No pulmonary edema. Heart size is upper normal. The patient is status post CABG with a pacing device in place. Atherosclerosis noted. No acute bony abnormality. IMPRESSION: Small bilateral pleural effusions and mild basilar atelectasis. Aortic Atherosclerosis (ICD10-I70.0). Electronically Signed   By: Inge Rise M.D.   On: 02/13/2020 14:28   US Venous Img Lower Bilateral  Result Date: 02/25/2020 CLINICAL DATA:  Bilateral lower extremity edema EXAM: BILATERAL LOWER EXTREMITY VENOUS DOPPLER ULTRASOUND TECHNIQUE: Gray-scale sonography with compression, as well as color and duplex ultrasound, were performed to evaluate the deep venous system(s) from the level of the common femoral vein through the popliteal and proximal calf veins.  COMPARISON:  None. FINDINGS: VENOUS Normal compressibility of the common femoral, superficial femoral, and popliteal veins, as well as the visualized calf veins. Visualized portions of profunda femoral vein and great saphenous vein unremarkable. No filling defects to suggest DVT on grayscale or color Doppler imaging. Doppler waveforms show normal direction of venous flow, normal respiratory  plasticity and response to augmentation. OTHER None. Limitations: The peroneal veins of the lower extremities bilaterally were not well visualized on this examination. IMPRESSION: Negative. Electronically Signed   By: Fidela Salisbury MD   On: 02/25/2020 01:19    Microbiology: Recent Results (from the past 240 hour(s))  SARS Coronavirus 2 by RT PCR (hospital order, performed in [facility] hospital lab) Nasopharyngeal Nasopharyngeal Swab     Status: None   Collection Time: 02/24/20 11:35 PM   Specimen: Nasopharyngeal Swab  Result Value Ref Range Status   SARS Coronavirus 2 NEGATIVE NEGATIVE Final    Comment: (NOTE) SARS-CoV-2 target nucleic acids are NOT DETECTED.  The SARS-CoV-2 RNA is generally detectable in upper and lower respiratory specimens during the acute phase of infection. The lowest concentration of SARS-CoV-2 viral copies this assay can detect is 250 copies / mL. A negative result does not preclude SARS-CoV-2 infection and should not be used as the sole basis for treatment or other patient management decisions.  A negative result may occur with improper specimen collection / handling, submission of specimen other than nasopharyngeal swab, presence of viral mutation(s) within the areas targeted by this assay, and inadequate number of viral copies (<250 copies / mL). A negative result must be combined with clinical observations, patient history, and epidemiological information.  Fact Sheet for Patients:   StrictlyIdeas.no  Fact Sheet for Healthcare Providers: BankingDealers.co.za  This test is not yet approved or  cleared by the [place] FDA and has been authorized for detection and/or diagnosis of SARS-CoV-2 by FDA under an Emergency Use Authorization (EUA).  This EUA will remain in effect (meaning this test can be used) for the duration of the COVID-19 declaration under Section 564(b)(1) of the Act, 21 U.S.C. section  360bbb-3(b)(1), unless the authorization is terminated or revoked sooner.  Performed at [facility], [address] [place], [place] [address]     Labs: Basic Metabolic Panel: Recent Labs  Lab 02/24/20 1246 02/26/20 0536  NA 139 137  K 3.8 3.1*  CL 101 93*  CO2 29 31  GLUCOSE 112* 114*  BUN 17 17  CREATININE 0.73 0.73  CALCIUM 9.5 9.5  MG  --  1.8  PHOS  --  4.2   Liver Function Tests: Recent Labs  Lab 02/24/20 1246 02/26/20 0536  AST 17 16  ALT 11 12  ALKPHOS 78 72  BILITOT 0.8 0.8  PROT 7.5 7.0  ALBUMIN 3.9 3.6   CBC: Recent Labs  Lab 02/24/20 1246 02/26/20 0536  WBC 6.2 6.6  NEUTROABS 4.1  --   HGB 10.4* 10.4*  HCT 32.7* 31.6*  MCV 85.4 82.9  PLT 289 287    BNP (last 3 results) Recent Labs    02/24/20 2331  BNP 223.3*    CBG: Recent Labs  Lab 02/26/20 1246 02/26/20 1624 02/26/20 2138 02/27/20 0735 02/27/20 1128  GLUCAP 137* 134* 141* 144* 130*     Signed:  Para Skeans MD.  [facility] 02/27/2020, 12:48 PM

## 2020-02-28 DIAGNOSIS — E261 Secondary hyperaldosteronism: Secondary | ICD-10-CM | POA: Diagnosis not present

## 2020-02-28 DIAGNOSIS — I509 Heart failure, unspecified: Secondary | ICD-10-CM | POA: Diagnosis not present

## 2020-02-29 DIAGNOSIS — R5383 Other fatigue: Secondary | ICD-10-CM | POA: Diagnosis not present

## 2020-03-01 DIAGNOSIS — M5136 Other intervertebral disc degeneration, lumbar region: Secondary | ICD-10-CM | POA: Diagnosis not present

## 2020-03-01 DIAGNOSIS — M179 Osteoarthritis of knee, unspecified: Secondary | ICD-10-CM | POA: Diagnosis not present

## 2020-03-01 DIAGNOSIS — M19012 Primary osteoarthritis, left shoulder: Secondary | ICD-10-CM | POA: Diagnosis not present

## 2020-03-01 DIAGNOSIS — I110 Hypertensive heart disease with heart failure: Secondary | ICD-10-CM | POA: Diagnosis not present

## 2020-03-01 DIAGNOSIS — M169 Osteoarthritis of hip, unspecified: Secondary | ICD-10-CM | POA: Diagnosis not present

## 2020-03-01 DIAGNOSIS — S82151D Displaced fracture of right tibial tuberosity, subsequent encounter for closed fracture with routine healing: Secondary | ICD-10-CM | POA: Diagnosis not present

## 2020-03-01 DIAGNOSIS — G8929 Other chronic pain: Secondary | ICD-10-CM | POA: Diagnosis not present

## 2020-03-01 DIAGNOSIS — M19011 Primary osteoarthritis, right shoulder: Secondary | ICD-10-CM | POA: Diagnosis not present

## 2020-03-01 DIAGNOSIS — M47816 Spondylosis without myelopathy or radiculopathy, lumbar region: Secondary | ICD-10-CM | POA: Diagnosis not present

## 2020-03-05 DIAGNOSIS — I25810 Atherosclerosis of coronary artery bypass graft(s) without angina pectoris: Secondary | ICD-10-CM | POA: Diagnosis not present

## 2020-03-05 DIAGNOSIS — I5023 Acute on chronic systolic (congestive) heart failure: Secondary | ICD-10-CM | POA: Diagnosis not present

## 2020-03-05 DIAGNOSIS — I442 Atrioventricular block, complete: Secondary | ICD-10-CM | POA: Diagnosis not present

## 2020-03-06 ENCOUNTER — Telehealth: Payer: Self-pay | Admitting: Family

## 2020-03-06 NOTE — Telephone Encounter (Signed)
LVM with patient regarding her follow up CHF CLinic that was made after patients recent hospital discharge and to confirm she was able to make it.   Macon Lesesne, NT

## 2020-03-07 NOTE — Progress Notes (Signed)
Patient ID: Amber [name], female    DOB: 11-02-1929, 86 y.o.   MRN: [id number]  HPI  Amber [name] is a 86 y/o female with a history of hyperlipidemia, HTN, DM, CAD, chronic pain, CKD, arthritis, anxiety, anemia and chronic heart failure.   Echo report from 12/06/19 reviewed and showed an EF of 55-60% along with mild LVH, moderate LAE and mild MR. Reviewed echo done 01/27/17 which showed an EF of 40-45% along with mild MR. Previous echo done 07/27/16 showed an EF of 35-40% along with moderate MR/TR. This is essentially unchanged from a previous echo done August 2017.  Admitted 02/24/20 due to acute on chronic HF. Cardiology consult obtained. Initially given IV lasix with transition to oral diuretics. Weaned off oxygen down to room air. Discharged after 3 days. Was in the ED 02/13/20 but left without being seen.    She presents today for a follow-up visit although hasn't been seen since May 2019. She presents with a chief complaint of minimal fatigue upon moderate exertion. She describes this as chronic in nature having been present for several years. She has associated cough, light-headedness, anxiety and chronic back pain along with this. She denies any abdominal distention, palpitations, pedal edema, chest pain, shortness of breath or weight gain.   Overall, she says that she feels much better since her recent hospitalization.   Past Medical History:  Diagnosis Date   Anemia    Anxiety    Arthritis    Benign neoplasm of colon    CHF (congestive heart failure) (HCC)    Chronic kidney disease    Chronic pain    Complication of anesthesia 1980's   hard time waking up   Coronary artery disease with unspecified angina pectoris    Cough    Diabetes mellitus without complication (HCC)    Essential hypertension    GERD (gastroesophageal reflux disease)    High risk medication use    Hyperlipidemia    Plantar fascial fibromatosis    Presence of permanent cardiac pacemaker     Shortness of breath dyspnea    Past Surgical History:  Procedure Laterality Date   ABDOMINAL HYSTERECTOMY     BACK SURGERY  1960's   cage and screws in lower back   CATARACT EXTRACTION W/ INTRAOCULAR LENS  IMPLANT, BILATERAL Bilateral    CORONARY ARTERY BYPASS GRAFT  2008   triple   HARDWARE REMOVAL Right 12/08/2019   Procedure: HARDWARE REMOVAL;  Surgeon: Hessie Knows, MD;  Location: [facility];  Service: Orthopedics;  Laterality: Right;   JOINT REPLACEMENT Right    hip and knee   ORIF ANKLE FRACTURE Right 07/27/2014   Procedure: OPEN REDUCTION INTERNAL FIXATION (ORIF) ANKLE FRACTURE;  Surgeon: Alta Corning, MD;  Location: [facility];  Service: Orthopedics;  Laterality: Right;   ORIF TIBIA FRACTURE Right 12/08/2019   Procedure: OPEN REDUCTION INTERNAL FIXATION (ORIF) TIBIA FRACTURE;  Surgeon: Hessie Knows, MD;  Location: [facility];  Service: Orthopedics;  Laterality: Right;   PACEMAKER INSERTION Left 03/14/2015   Procedure: INSERTION PACEMAKER;  Surgeon: Isaias Cowman, MD;  Location: [facility];  Service: Cardiovascular;  Laterality: Left;   PACEMAKER LEADLESS INSERTION N/A 01/24/2020   Procedure: PACEMAKER LEADLESS INSERTION;  Surgeon: Isaias Cowman, MD;  Location: [place] [facility];  Service: Cardiovascular;  Laterality: N/A;   ROTATOR CUFF REPAIR Left    TOTAL HIP ARTHROPLASTY Right    TOTAL HIP ARTHROPLASTY Left 02/09/2018   Procedure: TOTAL HIP ARTHROPLASTY ANTERIOR APPROACH;  Surgeon:  Hessie Knows, MD;  Location: [facility];  Service: Orthopedics;  Laterality: Left;   TOTAL KNEE ARTHROPLASTY Right    VEIN LIGATION AND STRIPPING     Family History  Problem Relation Age of Onset   Heart attack Mother    Heart disease Father    Alzheimer's disease Sister    Cervical cancer Sister    Heart failure Son    Social History   Tobacco Use   Smoking status: Never Smoker   Smokeless tobacco: Never Used  Substance Use Topics   Alcohol use: No    Allergies  Allergen Reactions   Ambien [Zolpidem Tartrate] Other (See Comments)    Reaction:  Keeps pt awake    Penicillins Itching and Other (See Comments)    Has patient had a PCN reaction causing immediate rash, facial/tongue/throat swelling, SOB or lightheadedness with hypotension: No Has patient had a PCN reaction causing severe rash involving mucus membranes or skin necrosis: No Has patient had a PCN reaction that required hospitalization No Has patient had a PCN reaction occurring within the last 10 years: No If all of the above answers are "NO", then may proceed with Cephalosporin use.   Iodine Itching   Succinylcholine Other (See Comments)    Reaction:  Unknown    Etodolac Other (See Comments)    GI upset   Nsaids Other (See Comments)    Reaction: gi upset   Prior to Admission medications   Medication Sig Start Date End Date Taking? Authorizing Provider  acetaminophen (TYLENOL) 500 MG tablet Take 1,000 mg by mouth every 6 (six) hours as needed for mild pain or headache.    Yes [provider]  albuterol (PROVENTIL HFA;VENTOLIN HFA) 108 (90 Base) MCG/ACT inhaler Inhale 2 puffs into the lungs every 6 (six) hours as needed for wheezing or shortness of breath. 01/28/17  Yes Wieting, Richard, MD  ALPRAZolam Duanne Moron) 1 MG tablet Take 1 tablet (1 mg total) by mouth 2 (two) times daily as needed for anxiety or sleep. Patient taking differently: Take 1 mg by mouth daily as needed for anxiety or sleep.  12/12/19  Yes Fritzi Mandes, MD  amLODipine (NORVASC) 5 MG tablet Take 5 mg by mouth daily.    Yes [provider]  apixaban (ELIQUIS) 5 MG TABS tablet Take 5 mg by mouth 2 (two) times daily.   Yes [provider]  calcium-vitamin D (OSCAL WITH D) 500-200 MG-UNIT tablet Take 1 tablet by mouth daily.   Yes [provider]  carvedilol (COREG) 6.25 MG tablet Take 1 tablet (6.25 mg total) by mouth 2 (two) times daily with a meal. 01/28/17  Yes Wieting,  Richard, MD  furosemide (LASIX) 20 MG tablet Take 1 tablet (20 mg total) by mouth 2 (two) times daily. Patient taking differently: Take 20 mg by mouth daily.  02/27/20 03/28/20 Yes Para Skeans, MD  gabapentin (NEURONTIN) 100 MG capsule Take 200 mg by mouth 3 (three) times daily.    Yes [provider]  insulin glargine (LANTUS) 100 UNIT/ML injection Inject 22 Units into the skin daily.   Yes [provider]  levETIRAcetam (KEPPRA) 500 MG tablet Take 500 mg by mouth 2 (two) times daily.   Yes [provider]  lisinopril (ZESTRIL) 20 MG tablet Take 1 tablet (20 mg total) by mouth daily. 02/27/20 03/28/20 Yes Para Skeans, MD  Multiple Vitamins-Minerals (PRESERVISION AREDS 2) CAPS Take 1 capsule by mouth daily.   Yes [provider]  omeprazole ([place])  40 MG capsule Take 40 mg by mouth daily.    Yes [provider]  pravastatin (PRAVACHOL) 40 MG tablet Take 40 mg by mouth at bedtime.   Yes [provider]     Review of Systems  Constitutional: Positive for fatigue. Negative for appetite change.  HENT: Negative for congestion, postnasal drip and sore throat.   Eyes: Negative.   Respiratory: Positive for cough. Negative for chest tightness, shortness of breath and wheezing.   Cardiovascular: Negative for chest pain, palpitations and leg swelling.  Gastrointestinal: Negative for abdominal distention and abdominal pain.  Endocrine: Negative.   Genitourinary: Negative.   Musculoskeletal: Positive for back pain. Negative for arthralgias and neck pain.  Skin: Negative.   Allergic/Immunologic: Negative.   Neurological: Positive for light-headedness (with sudden position changes). Negative for dizziness and numbness.  Hematological: Negative for adenopathy. Bruises/bleeds easily.  Psychiatric/Behavioral: Negative for dysphoric mood, sleep disturbance and suicidal ideas. The patient is nervous/anxious.    Vitals:   03/08/20 1201  BP: 121/61   Pulse: 61  Resp: 16  SpO2: 91%  Weight: 160 lb (72.6 kg)  Height: 4\' 11"  (1.499 m)   Wt Readings from Last 3 Encounters:  03/08/20 160 lb (72.6 kg)  02/27/20 164 lb (74.4 kg)  02/13/20 176 lb 7.7 oz (80 kg)   Lab Results  Component Value Date   CREATININE 0.73 02/26/2020   CREATININE 0.73 02/24/2020   CREATININE 0.72 02/13/2020    Physical Exam Vitals and nursing note reviewed.  Constitutional:      Appearance: She is well-developed.  HENT:     Head: Normocephalic and atraumatic.  Neck:     Vascular: No JVD.  Cardiovascular:     Rate and Rhythm: Normal rate and regular rhythm.  Pulmonary:     Effort: Pulmonary effort is normal.     Breath sounds: No wheezing, rhonchi or rales.  Abdominal:     General: There is no distension.     Palpations: Abdomen is soft.     Tenderness: There is no abdominal tenderness.  Musculoskeletal:        General: No tenderness.     Cervical back: Normal range of motion and neck supple.  Skin:    General: Skin is warm and dry.  Neurological:     Mental Status: She is alert and oriented to person, place, and time.  Psychiatric:        Behavior: Behavior normal.        Thought Content: Thought content normal.    Assessment & Plan:  1: Chronic heart failure with preserved ejection fraction along with structural changes (LVH/LAE)- - NYHA class II - euvolemic - continue weighing daily. Reminded to call for an overnight weight gain of >2 pounds or a weekly weight gain of >5 pounds.  - patient says home weight has been stable - not adding salt to her food and is trying to eat low sodium foods  - saw her cardiologist Nehemiah Massed) 03/05/20 - consider changing her lisinopril to entresto in the future if BP remains ok - BNP 02/24/20 was 223.3 - reports receiving both covid vaccines  2: HTN- - BP looks good today - saw PCP ([facility]) 02/21/20 - BMP from 02/26/20 reviewed and showed sodium 137, potassium 3.1, creatinine 0.73 and GFR >60  3:  DM- - A1c 02/25/20 was 6.4% - glucose at home was 84   Patient did not bring her medications nor a list. Each medication was verbally reviewed with the patient and  she was encouraged to bring the bottles to every visit to confirm accuracy of list.  Return in 3 months or sooner for any questions/problems before then.

## 2020-03-08 ENCOUNTER — Encounter: Payer: Self-pay | Admitting: Family

## 2020-03-08 ENCOUNTER — Other Ambulatory Visit: Payer: Self-pay

## 2020-03-08 ENCOUNTER — Ambulatory Visit: Payer: Medicare HMO | Attending: Family | Admitting: Family

## 2020-03-08 VITALS — BP 121/61 | HR 61 | Resp 16 | Ht 59.0 in | Wt 160.0 lb

## 2020-03-08 DIAGNOSIS — Z951 Presence of aortocoronary bypass graft: Secondary | ICD-10-CM | POA: Diagnosis not present

## 2020-03-08 DIAGNOSIS — Z79899 Other long term (current) drug therapy: Secondary | ICD-10-CM | POA: Diagnosis not present

## 2020-03-08 DIAGNOSIS — M1990 Unspecified osteoarthritis, unspecified site: Secondary | ICD-10-CM | POA: Diagnosis not present

## 2020-03-08 DIAGNOSIS — I10 Essential (primary) hypertension: Secondary | ICD-10-CM

## 2020-03-08 DIAGNOSIS — I130 Hypertensive heart and chronic kidney disease with heart failure and stage 1 through stage 4 chronic kidney disease, or unspecified chronic kidney disease: Secondary | ICD-10-CM | POA: Insufficient documentation

## 2020-03-08 DIAGNOSIS — Z7901 Long term (current) use of anticoagulants: Secondary | ICD-10-CM | POA: Insufficient documentation

## 2020-03-08 DIAGNOSIS — I2510 Atherosclerotic heart disease of native coronary artery without angina pectoris: Secondary | ICD-10-CM | POA: Diagnosis not present

## 2020-03-08 DIAGNOSIS — Z950 Presence of cardiac pacemaker: Secondary | ICD-10-CM | POA: Insufficient documentation

## 2020-03-08 DIAGNOSIS — Z86010 Personal history of colonic polyps: Secondary | ICD-10-CM | POA: Diagnosis not present

## 2020-03-08 DIAGNOSIS — Z886 Allergy status to analgesic agent status: Secondary | ICD-10-CM | POA: Diagnosis not present

## 2020-03-08 DIAGNOSIS — E1122 Type 2 diabetes mellitus with diabetic chronic kidney disease: Secondary | ICD-10-CM | POA: Insufficient documentation

## 2020-03-08 DIAGNOSIS — Z794 Long term (current) use of insulin: Secondary | ICD-10-CM | POA: Insufficient documentation

## 2020-03-08 DIAGNOSIS — Z96651 Presence of right artificial knee joint: Secondary | ICD-10-CM | POA: Insufficient documentation

## 2020-03-08 DIAGNOSIS — Z96643 Presence of artificial hip joint, bilateral: Secondary | ICD-10-CM | POA: Diagnosis not present

## 2020-03-08 DIAGNOSIS — Z888 Allergy status to other drugs, medicaments and biological substances status: Secondary | ICD-10-CM | POA: Insufficient documentation

## 2020-03-08 DIAGNOSIS — E785 Hyperlipidemia, unspecified: Secondary | ICD-10-CM | POA: Insufficient documentation

## 2020-03-08 DIAGNOSIS — N189 Chronic kidney disease, unspecified: Secondary | ICD-10-CM | POA: Insufficient documentation

## 2020-03-08 DIAGNOSIS — Z8249 Family history of ischemic heart disease and other diseases of the circulatory system: Secondary | ICD-10-CM | POA: Insufficient documentation

## 2020-03-08 DIAGNOSIS — Z880 Allergy status to penicillin: Secondary | ICD-10-CM | POA: Insufficient documentation

## 2020-03-08 DIAGNOSIS — K219 Gastro-esophageal reflux disease without esophagitis: Secondary | ICD-10-CM | POA: Insufficient documentation

## 2020-03-08 DIAGNOSIS — I5032 Chronic diastolic (congestive) heart failure: Secondary | ICD-10-CM | POA: Insufficient documentation

## 2020-03-08 DIAGNOSIS — F419 Anxiety disorder, unspecified: Secondary | ICD-10-CM | POA: Diagnosis not present

## 2020-03-08 DIAGNOSIS — Z90710 Acquired absence of both cervix and uterus: Secondary | ICD-10-CM | POA: Diagnosis not present

## 2020-03-08 NOTE — Patient Instructions (Signed)
Continue weighing daily and call for an overnight weight gain of > 2 pounds or a weekly weight gain of >5 pounds. 

## 2020-03-09 DIAGNOSIS — E261 Secondary hyperaldosteronism: Secondary | ICD-10-CM | POA: Diagnosis not present

## 2020-03-09 DIAGNOSIS — I509 Heart failure, unspecified: Secondary | ICD-10-CM | POA: Diagnosis not present

## 2020-04-02 DIAGNOSIS — R0989 Other specified symptoms and signs involving the circulatory and respiratory systems: Secondary | ICD-10-CM | POA: Diagnosis not present

## 2020-04-02 DIAGNOSIS — I340 Nonrheumatic mitral (valve) insufficiency: Secondary | ICD-10-CM | POA: Diagnosis not present

## 2020-04-02 DIAGNOSIS — Z950 Presence of cardiac pacemaker: Secondary | ICD-10-CM | POA: Diagnosis not present

## 2020-04-02 DIAGNOSIS — I5042 Chronic combined systolic (congestive) and diastolic (congestive) heart failure: Secondary | ICD-10-CM | POA: Diagnosis not present

## 2020-04-02 DIAGNOSIS — I495 Sick sinus syndrome: Secondary | ICD-10-CM | POA: Diagnosis not present

## 2020-04-02 DIAGNOSIS — I071 Rheumatic tricuspid insufficiency: Secondary | ICD-10-CM | POA: Diagnosis not present

## 2020-04-02 DIAGNOSIS — I25810 Atherosclerosis of coronary artery bypass graft(s) without angina pectoris: Secondary | ICD-10-CM | POA: Diagnosis not present

## 2020-04-02 DIAGNOSIS — I350 Nonrheumatic aortic (valve) stenosis: Secondary | ICD-10-CM | POA: Diagnosis not present

## 2020-04-02 DIAGNOSIS — Z23 Encounter for immunization: Secondary | ICD-10-CM | POA: Diagnosis not present

## 2020-04-18 DIAGNOSIS — I495 Sick sinus syndrome: Secondary | ICD-10-CM | POA: Diagnosis not present

## 2020-05-01 DIAGNOSIS — I495 Sick sinus syndrome: Secondary | ICD-10-CM | POA: Diagnosis not present

## 2020-05-01 DIAGNOSIS — I5042 Chronic combined systolic (congestive) and diastolic (congestive) heart failure: Secondary | ICD-10-CM | POA: Diagnosis not present

## 2020-05-01 DIAGNOSIS — Z950 Presence of cardiac pacemaker: Secondary | ICD-10-CM | POA: Diagnosis not present

## 2020-05-01 DIAGNOSIS — I340 Nonrheumatic mitral (valve) insufficiency: Secondary | ICD-10-CM | POA: Diagnosis not present

## 2020-05-01 DIAGNOSIS — I442 Atrioventricular block, complete: Secondary | ICD-10-CM | POA: Diagnosis not present

## 2020-05-01 DIAGNOSIS — R0989 Other specified symptoms and signs involving the circulatory and respiratory systems: Secondary | ICD-10-CM | POA: Diagnosis not present

## 2020-05-01 DIAGNOSIS — I6523 Occlusion and stenosis of bilateral carotid arteries: Secondary | ICD-10-CM | POA: Diagnosis not present

## 2020-05-01 DIAGNOSIS — I350 Nonrheumatic aortic (valve) stenosis: Secondary | ICD-10-CM | POA: Diagnosis not present

## 2020-05-01 DIAGNOSIS — I25810 Atherosclerosis of coronary artery bypass graft(s) without angina pectoris: Secondary | ICD-10-CM | POA: Diagnosis not present

## 2020-05-01 DIAGNOSIS — I5022 Chronic systolic (congestive) heart failure: Secondary | ICD-10-CM | POA: Diagnosis not present

## 2020-05-01 DIAGNOSIS — I071 Rheumatic tricuspid insufficiency: Secondary | ICD-10-CM | POA: Diagnosis not present

## 2020-05-25 DIAGNOSIS — I25810 Atherosclerosis of coronary artery bypass graft(s) without angina pectoris: Secondary | ICD-10-CM | POA: Diagnosis not present

## 2020-05-25 DIAGNOSIS — Z794 Long term (current) use of insulin: Secondary | ICD-10-CM | POA: Diagnosis not present

## 2020-05-25 DIAGNOSIS — E785 Hyperlipidemia, unspecified: Secondary | ICD-10-CM | POA: Diagnosis not present

## 2020-05-25 DIAGNOSIS — E1169 Type 2 diabetes mellitus with other specified complication: Secondary | ICD-10-CM | POA: Diagnosis not present

## 2020-05-25 DIAGNOSIS — E1142 Type 2 diabetes mellitus with diabetic polyneuropathy: Secondary | ICD-10-CM | POA: Diagnosis not present

## 2020-05-25 DIAGNOSIS — M25511 Pain in right shoulder: Secondary | ICD-10-CM | POA: Diagnosis not present

## 2020-05-25 DIAGNOSIS — G8929 Other chronic pain: Secondary | ICD-10-CM | POA: Diagnosis not present

## 2020-05-25 DIAGNOSIS — Z79899 Other long term (current) drug therapy: Secondary | ICD-10-CM | POA: Diagnosis not present

## 2020-06-07 ENCOUNTER — Ambulatory Visit: Payer: Medicare HMO | Admitting: Family

## 2020-07-09 ENCOUNTER — Ambulatory Visit: Payer: Medicare HMO | Admitting: Family

## 2020-07-13 DIAGNOSIS — Z950 Presence of cardiac pacemaker: Secondary | ICD-10-CM | POA: Diagnosis not present

## 2020-07-13 DIAGNOSIS — D692 Other nonthrombocytopenic purpura: Secondary | ICD-10-CM | POA: Diagnosis not present

## 2020-07-13 DIAGNOSIS — Z794 Long term (current) use of insulin: Secondary | ICD-10-CM | POA: Diagnosis not present

## 2020-07-13 DIAGNOSIS — I25118 Atherosclerotic heart disease of native coronary artery with other forms of angina pectoris: Secondary | ICD-10-CM | POA: Diagnosis not present

## 2020-07-13 DIAGNOSIS — E261 Secondary hyperaldosteronism: Secondary | ICD-10-CM | POA: Diagnosis not present

## 2020-07-13 DIAGNOSIS — I495 Sick sinus syndrome: Secondary | ICD-10-CM | POA: Diagnosis not present

## 2020-07-13 DIAGNOSIS — G40909 Epilepsy, unspecified, not intractable, without status epilepticus: Secondary | ICD-10-CM | POA: Diagnosis not present

## 2020-07-13 DIAGNOSIS — I509 Heart failure, unspecified: Secondary | ICD-10-CM | POA: Diagnosis not present

## 2020-07-13 DIAGNOSIS — E1159 Type 2 diabetes mellitus with other circulatory complications: Secondary | ICD-10-CM | POA: Diagnosis not present

## 2020-07-13 DIAGNOSIS — E1165 Type 2 diabetes mellitus with hyperglycemia: Secondary | ICD-10-CM | POA: Diagnosis not present

## 2020-07-13 DIAGNOSIS — J449 Chronic obstructive pulmonary disease, unspecified: Secondary | ICD-10-CM | POA: Diagnosis not present

## 2020-07-19 ENCOUNTER — Other Ambulatory Visit: Payer: Self-pay

## 2020-07-19 ENCOUNTER — Encounter: Payer: Self-pay | Admitting: Family

## 2020-07-19 ENCOUNTER — Ambulatory Visit: Payer: Medicare HMO | Attending: Family | Admitting: Family

## 2020-07-19 VITALS — BP 115/73 | HR 66 | Resp 16 | Ht <= 58 in | Wt 172.0 lb

## 2020-07-19 DIAGNOSIS — Z951 Presence of aortocoronary bypass graft: Secondary | ICD-10-CM | POA: Diagnosis not present

## 2020-07-19 DIAGNOSIS — Z7901 Long term (current) use of anticoagulants: Secondary | ICD-10-CM | POA: Insufficient documentation

## 2020-07-19 DIAGNOSIS — I2510 Atherosclerotic heart disease of native coronary artery without angina pectoris: Secondary | ICD-10-CM | POA: Diagnosis not present

## 2020-07-19 DIAGNOSIS — I5032 Chronic diastolic (congestive) heart failure: Secondary | ICD-10-CM | POA: Diagnosis not present

## 2020-07-19 DIAGNOSIS — E119 Type 2 diabetes mellitus without complications: Secondary | ICD-10-CM

## 2020-07-19 DIAGNOSIS — Z886 Allergy status to analgesic agent status: Secondary | ICD-10-CM | POA: Insufficient documentation

## 2020-07-19 DIAGNOSIS — Z880 Allergy status to penicillin: Secondary | ICD-10-CM | POA: Diagnosis not present

## 2020-07-19 DIAGNOSIS — Z950 Presence of cardiac pacemaker: Secondary | ICD-10-CM | POA: Insufficient documentation

## 2020-07-19 DIAGNOSIS — G479 Sleep disorder, unspecified: Secondary | ICD-10-CM | POA: Insufficient documentation

## 2020-07-19 DIAGNOSIS — Z79899 Other long term (current) drug therapy: Secondary | ICD-10-CM | POA: Diagnosis not present

## 2020-07-19 DIAGNOSIS — I130 Hypertensive heart and chronic kidney disease with heart failure and stage 1 through stage 4 chronic kidney disease, or unspecified chronic kidney disease: Secondary | ICD-10-CM | POA: Diagnosis not present

## 2020-07-19 DIAGNOSIS — N189 Chronic kidney disease, unspecified: Secondary | ICD-10-CM | POA: Diagnosis not present

## 2020-07-19 DIAGNOSIS — Z8249 Family history of ischemic heart disease and other diseases of the circulatory system: Secondary | ICD-10-CM | POA: Insufficient documentation

## 2020-07-19 DIAGNOSIS — Z888 Allergy status to other drugs, medicaments and biological substances status: Secondary | ICD-10-CM | POA: Insufficient documentation

## 2020-07-19 DIAGNOSIS — R059 Cough, unspecified: Secondary | ICD-10-CM | POA: Diagnosis not present

## 2020-07-19 DIAGNOSIS — Z794 Long term (current) use of insulin: Secondary | ICD-10-CM | POA: Insufficient documentation

## 2020-07-19 DIAGNOSIS — E1122 Type 2 diabetes mellitus with diabetic chronic kidney disease: Secondary | ICD-10-CM | POA: Diagnosis not present

## 2020-07-19 DIAGNOSIS — I10 Essential (primary) hypertension: Secondary | ICD-10-CM

## 2020-07-19 NOTE — Progress Notes (Signed)
Patient ID: Amber [name], female    DOB: Sep 19, 1929, 87 y.o.   MRN: [id number]  HPI  [name] is a 87 y/o female with a history of hyperlipidemia, HTN, DM, CAD, chronic pain, CKD, arthritis, anxiety, anemia and chronic heart failure.   Echo report from 12/06/19 reviewed and showed an EF of 55-60% along with mild LVH, moderate LAE and mild MR. Reviewed echo done 01/27/17 which showed an EF of 40-45% along with mild MR. Previous echo done 07/27/16 showed an EF of 35-40% along with moderate MR/TR. This is essentially unchanged from a previous echo done August 2017.  Admitted 02/24/20 due to acute on chronic HF. Cardiology consult obtained. Initially given IV lasix with transition to oral diuretics. Weaned off oxygen down to room air. Discharged after 3 days. Was in the ED 02/13/20 but left without being seen.    She presents today for a follow-up visit with a chief complaint of minimal fatigue upon moderate exertion. She describes this as chronic in nature having been present for several years. She has associated cough, easy bruising, difficulty sleeping and gradual weight gain along with this. She denies any dizziness, abdominal distention, palpitations, pedal edema, chest pain, shortness of breath or wheezing.   Says that her son is going to be having a sleep study and other test done so her appointments need to work around his. She says that she's supposed to see her cardiologist Nehemiah Massed) soon but that she's going to have to cancel it because of her son's appointment.   Past Medical History:  Diagnosis Date  . Anemia   . Anxiety   . Arthritis   . Benign neoplasm of colon   . CHF (congestive heart failure) ([place])   . Chronic kidney disease   . Chronic pain   . Complication of anesthesia 1980's   hard time waking up  . Coronary artery disease with unspecified angina pectoris   . Cough   . Diabetes mellitus without complication ([place])   . Essential hypertension   . GERD  (gastroesophageal reflux disease)   . High risk medication use   . Hyperlipidemia   . Plantar fascial fibromatosis   . Presence of permanent cardiac pacemaker   . Shortness of breath dyspnea    Past Surgical History:  Procedure Laterality Date  . ABDOMINAL HYSTERECTOMY    . BACK SURGERY  1960's   cage and screws in lower back  . CATARACT EXTRACTION W/ INTRAOCULAR LENS  IMPLANT, BILATERAL Bilateral   . CORONARY ARTERY BYPASS GRAFT  2008   triple  . HARDWARE REMOVAL Right 12/08/2019   Procedure: HARDWARE REMOVAL;  Surgeon: Hessie Knows, MD;  Location: [facility];  Service: Orthopedics;  Laterality: Right;  . JOINT REPLACEMENT Right    hip and knee  . ORIF ANKLE FRACTURE Right 07/27/2014   Procedure: OPEN REDUCTION INTERNAL FIXATION (ORIF) ANKLE FRACTURE;  Surgeon: Alta Corning, MD;  Location: [place];  Service: Orthopedics;  Laterality: Right;  . ORIF TIBIA FRACTURE Right 12/08/2019   Procedure: OPEN REDUCTION INTERNAL FIXATION (ORIF) TIBIA FRACTURE;  Surgeon: Hessie Knows, MD;  Location: [facility];  Service: Orthopedics;  Laterality: Right;  . PACEMAKER INSERTION Left 03/14/2015   Procedure: INSERTION PACEMAKER;  Surgeon: Isaias Cowman, MD;  Location: [facility];  Service: Cardiovascular;  Laterality: Left;  . PACEMAKER LEADLESS INSERTION N/A 01/24/2020   Procedure: PACEMAKER LEADLESS INSERTION;  Surgeon: Isaias Cowman, MD;  Location: [place] [facility];  Service: Cardiovascular;  Laterality: N/A;  .  ROTATOR CUFF REPAIR Left   . TOTAL HIP ARTHROPLASTY Right   . TOTAL HIP ARTHROPLASTY Left 02/09/2018   Procedure: TOTAL HIP ARTHROPLASTY ANTERIOR APPROACH;  Surgeon: Hessie Knows, MD;  Location: [facility];  Service: Orthopedics;  Laterality: Left;  . TOTAL KNEE ARTHROPLASTY Right   . VEIN LIGATION AND STRIPPING     Family History  Problem Relation Age of Onset  . Heart attack Mother   . Heart disease Father   . Alzheimer's disease Sister   . Cervical cancer Sister   . Heart  failure Son    Social History   Tobacco Use  . Smoking status: Never Smoker  . Smokeless tobacco: Never Used  Substance Use Topics  . Alcohol use: No   Allergies  Allergen Reactions  . Ambien [Zolpidem Tartrate] Other (See Comments)    Reaction:  Keeps pt awake   . Penicillins Itching and Other (See Comments)    Has patient had a PCN reaction causing immediate rash, facial/tongue/throat swelling, SOB or lightheadedness with hypotension: No Has patient had a PCN reaction causing severe rash involving mucus membranes or skin necrosis: No Has patient had a PCN reaction that required hospitalization No Has patient had a PCN reaction occurring within the last 10 years: No If all of the above answers are "NO", then may proceed with Cephalosporin use.  . Iodine Itching  . Succinylcholine Other (See Comments)    Reaction:  Unknown   . Etodolac Other (See Comments)    GI upset  . Nsaids Other (See Comments)    Reaction: gi upset   Prior to Admission medications   Medication Sig Start Date End Date Taking? Authorizing Provider  acetaminophen (TYLENOL) 500 MG tablet Take 1,000 mg by mouth every 6 (six) hours as needed for mild pain or headache.    Yes [provider]  albuterol (PROVENTIL HFA;VENTOLIN HFA) 108 (90 Base) MCG/ACT inhaler Inhale 2 puffs into the lungs every 6 (six) hours as needed for wheezing or shortness of breath. 01/28/17  Yes Wieting, Richard, MD  ALPRAZolam Duanne Moron) 1 MG tablet Take 1 tablet (1 mg total) by mouth 2 (two) times daily as needed for anxiety or sleep. Patient taking differently: Take 1 mg by mouth daily as needed for anxiety or sleep. 12/12/19  Yes Fritzi Mandes, MD  amLODipine (NORVASC) 5 MG tablet Take 5 mg by mouth daily.    Yes [provider]  apixaban (ELIQUIS) 5 MG TABS tablet Take 5 mg by mouth 2 (two) times daily.   Yes [provider]  calcium-vitamin D (OSCAL WITH D) 500-200 MG-UNIT tablet Take 1 tablet by mouth daily.   Yes  [provider]  carvedilol (COREG) 6.25 MG tablet Take 1 tablet (6.25 mg total) by mouth 2 (two) times daily with a meal. 01/28/17  Yes Wieting, Richard, MD  furosemide (LASIX) 20 MG tablet Take 1 tablet (20 mg total) by mouth 2 (two) times daily. Patient taking differently: Take 20 mg by mouth daily. 02/27/20 03/28/20 Yes Para Skeans, MD  gabapentin (NEURONTIN) 100 MG capsule Take 200 mg by mouth 3 (three) times daily.    Yes [provider]  insulin glargine (LANTUS) 100 UNIT/ML injection Inject 22 Units into the skin daily.   Yes [provider]  levETIRAcetam (KEPPRA) 500 MG tablet Take 500 mg by mouth 2 (two) times daily.   Yes [provider]  lisinopril (ZESTRIL) 20 MG tablet Take 1 tablet (20 mg total) by mouth daily. 02/27/20 03/28/20  Yes Para Skeans, MD  Multiple Vitamins-Minerals (PRESERVISION AREDS 2) CAPS Take 1 capsule by mouth daily.   Yes [provider]  pravastatin (PRAVACHOL) 40 MG tablet Take 40 mg by mouth at bedtime.   Yes [provider]  omeprazole (PRILOSEC) 40 MG capsule Take 40 mg by mouth daily.     [provider]    Review of Systems  Constitutional: Positive for fatigue. Negative for appetite change.  HENT: Negative for congestion, postnasal drip and sore throat.   Eyes: Negative.   Respiratory: Positive for cough. Negative for chest tightness, shortness of breath and wheezing.   Cardiovascular: Negative for chest pain, palpitations and leg swelling.  Gastrointestinal: Negative for abdominal distention and abdominal pain.  Endocrine: Negative.   Genitourinary: Negative.   Musculoskeletal: Positive for back pain. Negative for arthralgias and neck pain.  Skin: Negative.   Allergic/Immunologic: Negative.   Neurological: Negative for dizziness, light-headedness and numbness.  Hematological: Negative for adenopathy. Bruises/bleeds easily.  Psychiatric/Behavioral: Positive for sleep disturbance.  Negative for dysphoric mood and suicidal ideas. The patient is not nervous/anxious.    Vitals:   07/19/20 1324  BP: 115/73  Pulse: 66  Resp: 16  SpO2: 92%  Weight: 172 lb (78 kg)  Height: 4\' 9"  (1.448 m)   Wt Readings from Last 3 Encounters:  07/19/20 172 lb (78 kg)  03/08/20 160 lb (72.6 kg)  02/27/20 164 lb (74.4 kg)   Lab Results  Component Value Date   CREATININE 0.73 02/26/2020   CREATININE 0.73 02/24/2020   CREATININE 0.72 02/13/2020    Physical Exam Vitals and nursing note reviewed.  Constitutional:      Appearance: She is well-developed.  HENT:     Head: Normocephalic and atraumatic.  Neck:     Vascular: No JVD.  Cardiovascular:     Rate and Rhythm: Normal rate and regular rhythm.  Pulmonary:     Effort: Pulmonary effort is normal.     Breath sounds: No wheezing, rhonchi or rales.  Abdominal:     General: There is no distension.     Palpations: Abdomen is soft.     Tenderness: There is no abdominal tenderness.  Musculoskeletal:        General: No tenderness.     Cervical back: Normal range of motion and neck supple.  Skin:    General: Skin is warm and dry.  Neurological:     Mental Status: She is alert and oriented to person, place, and time.  Psychiatric:        Behavior: Behavior normal.        Thought Content: Thought content normal.    Assessment & Plan:  1: Chronic heart failure with preserved ejection fraction along with structural changes (LVH/LAE)- - NYHA class II - euvolemic - continue weighing daily. Reminded to call for an overnight weight gain of >2 pounds or a weekly weight gain of >5 pounds.  - weight up 12 pounds from last visit here 4 months ago - not adding salt to her food and is trying to eat low sodium foods  - saw cardiology Zenia Resides) 05/01/20 but says that she will probably cancel her next appt due to son's appointment; emphasized to her that she needs to just r/s it instead of cancelling so that she can make sure she doesn't  lose track of time and forget about going - current BP may not tolerate changing lisinopril to entresto - BNP 02/24/20 was 223.3 - reports receiving both covid vaccines  2: HTN- - BP looks good today although on the lower side (115/73) - saw PCP ([facility]) 05/25/20 - BMP from 05/25/20 reviewed and showed sodium 135, potassium 4.9, creatinine 1.0 and GFR 52  3: DM- - A1c 05/25/20 was 8.7% - glucose at home was 102   Patient did not bring her medications nor a list. Each medication was verbally reviewed with the patient and she was encouraged to bring the bottles to every visit to confirm accuracy of list.  Return in 6 months or sooner for any questions/problems before then.

## 2020-07-19 NOTE — Patient Instructions (Signed)
Continue weighing daily and call for an overnight weight gain of > 2 pounds or a weekly weight gain of >5 pounds. 

## 2020-08-24 DIAGNOSIS — I2510 Atherosclerotic heart disease of native coronary artery without angina pectoris: Secondary | ICD-10-CM | POA: Diagnosis not present

## 2020-08-24 DIAGNOSIS — I509 Heart failure, unspecified: Secondary | ICD-10-CM | POA: Diagnosis not present

## 2020-08-24 DIAGNOSIS — I517 Cardiomegaly: Secondary | ICD-10-CM | POA: Diagnosis not present

## 2020-08-24 DIAGNOSIS — E785 Hyperlipidemia, unspecified: Secondary | ICD-10-CM | POA: Diagnosis not present

## 2020-08-24 DIAGNOSIS — E119 Type 2 diabetes mellitus without complications: Secondary | ICD-10-CM | POA: Diagnosis not present

## 2020-08-24 DIAGNOSIS — Z0000 Encounter for general adult medical examination without abnormal findings: Secondary | ICD-10-CM | POA: Diagnosis not present

## 2020-08-24 DIAGNOSIS — Z79899 Other long term (current) drug therapy: Secondary | ICD-10-CM | POA: Diagnosis not present

## 2020-08-24 DIAGNOSIS — Z794 Long term (current) use of insulin: Secondary | ICD-10-CM | POA: Diagnosis not present

## 2020-08-24 DIAGNOSIS — R0602 Shortness of breath: Secondary | ICD-10-CM | POA: Diagnosis not present

## 2020-08-24 DIAGNOSIS — I110 Hypertensive heart disease with heart failure: Secondary | ICD-10-CM | POA: Diagnosis not present

## 2020-08-29 DIAGNOSIS — E785 Hyperlipidemia, unspecified: Secondary | ICD-10-CM | POA: Diagnosis not present

## 2020-08-29 DIAGNOSIS — I350 Nonrheumatic aortic (valve) stenosis: Secondary | ICD-10-CM | POA: Diagnosis not present

## 2020-08-29 DIAGNOSIS — I2510 Atherosclerotic heart disease of native coronary artery without angina pectoris: Secondary | ICD-10-CM | POA: Diagnosis not present

## 2020-08-29 DIAGNOSIS — E1169 Type 2 diabetes mellitus with other specified complication: Secondary | ICD-10-CM | POA: Diagnosis not present

## 2020-08-29 DIAGNOSIS — I779 Disorder of arteries and arterioles, unspecified: Secondary | ICD-10-CM | POA: Diagnosis not present

## 2020-08-29 DIAGNOSIS — I6523 Occlusion and stenosis of bilateral carotid arteries: Secondary | ICD-10-CM | POA: Diagnosis not present

## 2020-09-14 DIAGNOSIS — I152 Hypertension secondary to endocrine disorders: Secondary | ICD-10-CM | POA: Diagnosis not present

## 2020-09-14 DIAGNOSIS — E1142 Type 2 diabetes mellitus with diabetic polyneuropathy: Secondary | ICD-10-CM | POA: Diagnosis not present

## 2020-09-14 DIAGNOSIS — E785 Hyperlipidemia, unspecified: Secondary | ICD-10-CM | POA: Diagnosis not present

## 2020-09-14 DIAGNOSIS — Z794 Long term (current) use of insulin: Secondary | ICD-10-CM | POA: Diagnosis not present

## 2020-09-14 DIAGNOSIS — E1169 Type 2 diabetes mellitus with other specified complication: Secondary | ICD-10-CM | POA: Diagnosis not present

## 2020-09-14 DIAGNOSIS — E1159 Type 2 diabetes mellitus with other circulatory complications: Secondary | ICD-10-CM | POA: Diagnosis not present

## 2020-11-08 DIAGNOSIS — E1151 Type 2 diabetes mellitus with diabetic peripheral angiopathy without gangrene: Secondary | ICD-10-CM | POA: Diagnosis not present

## 2020-11-08 DIAGNOSIS — I495 Sick sinus syndrome: Secondary | ICD-10-CM | POA: Diagnosis not present

## 2020-11-08 DIAGNOSIS — Z794 Long term (current) use of insulin: Secondary | ICD-10-CM | POA: Diagnosis not present

## 2020-11-08 DIAGNOSIS — I25118 Atherosclerotic heart disease of native coronary artery with other forms of angina pectoris: Secondary | ICD-10-CM | POA: Diagnosis not present

## 2020-11-08 DIAGNOSIS — J449 Chronic obstructive pulmonary disease, unspecified: Secondary | ICD-10-CM | POA: Diagnosis not present

## 2020-11-08 DIAGNOSIS — E1165 Type 2 diabetes mellitus with hyperglycemia: Secondary | ICD-10-CM | POA: Diagnosis not present

## 2020-11-08 DIAGNOSIS — Z950 Presence of cardiac pacemaker: Secondary | ICD-10-CM | POA: Diagnosis not present

## 2020-11-23 DIAGNOSIS — Z79899 Other long term (current) drug therapy: Secondary | ICD-10-CM | POA: Diagnosis not present

## 2020-11-23 DIAGNOSIS — Z794 Long term (current) use of insulin: Secondary | ICD-10-CM | POA: Diagnosis not present

## 2020-11-23 DIAGNOSIS — E1169 Type 2 diabetes mellitus with other specified complication: Secondary | ICD-10-CM | POA: Diagnosis not present

## 2020-11-23 DIAGNOSIS — I10 Essential (primary) hypertension: Secondary | ICD-10-CM | POA: Diagnosis not present

## 2020-11-23 DIAGNOSIS — G894 Chronic pain syndrome: Secondary | ICD-10-CM | POA: Diagnosis not present

## 2020-11-23 DIAGNOSIS — R0902 Hypoxemia: Secondary | ICD-10-CM | POA: Diagnosis not present

## 2020-11-23 DIAGNOSIS — E1142 Type 2 diabetes mellitus with diabetic polyneuropathy: Secondary | ICD-10-CM | POA: Diagnosis not present

## 2020-11-23 DIAGNOSIS — I2510 Atherosclerotic heart disease of native coronary artery without angina pectoris: Secondary | ICD-10-CM | POA: Diagnosis not present

## 2020-11-23 DIAGNOSIS — E785 Hyperlipidemia, unspecified: Secondary | ICD-10-CM | POA: Diagnosis not present

## 2020-11-29 DIAGNOSIS — R0602 Shortness of breath: Secondary | ICD-10-CM | POA: Diagnosis not present

## 2020-11-29 DIAGNOSIS — G4734 Idiopathic sleep related nonobstructive alveolar hypoventilation: Secondary | ICD-10-CM | POA: Diagnosis not present

## 2020-11-29 DIAGNOSIS — I700 Atherosclerosis of aorta: Secondary | ICD-10-CM | POA: Diagnosis not present

## 2020-12-12 DIAGNOSIS — G4730 Sleep apnea, unspecified: Secondary | ICD-10-CM | POA: Diagnosis not present

## 2020-12-12 DIAGNOSIS — R0683 Snoring: Secondary | ICD-10-CM | POA: Diagnosis not present

## 2020-12-14 DIAGNOSIS — M5136 Other intervertebral disc degeneration, lumbar region: Secondary | ICD-10-CM | POA: Diagnosis not present

## 2020-12-14 DIAGNOSIS — M545 Low back pain, unspecified: Secondary | ICD-10-CM | POA: Diagnosis not present

## 2020-12-14 DIAGNOSIS — M25551 Pain in right hip: Secondary | ICD-10-CM | POA: Diagnosis not present

## 2020-12-14 DIAGNOSIS — M25511 Pain in right shoulder: Secondary | ICD-10-CM | POA: Diagnosis not present

## 2020-12-14 DIAGNOSIS — G8929 Other chronic pain: Secondary | ICD-10-CM | POA: Diagnosis not present

## 2020-12-14 DIAGNOSIS — M19011 Primary osteoarthritis, right shoulder: Secondary | ICD-10-CM | POA: Diagnosis not present

## 2020-12-24 DIAGNOSIS — I110 Hypertensive heart disease with heart failure: Secondary | ICD-10-CM | POA: Diagnosis not present

## 2020-12-24 DIAGNOSIS — I442 Atrioventricular block, complete: Secondary | ICD-10-CM | POA: Diagnosis not present

## 2020-12-24 DIAGNOSIS — Z951 Presence of aortocoronary bypass graft: Secondary | ICD-10-CM | POA: Diagnosis not present

## 2020-12-24 DIAGNOSIS — Z9181 History of falling: Secondary | ICD-10-CM | POA: Diagnosis not present

## 2020-12-24 DIAGNOSIS — E119 Type 2 diabetes mellitus without complications: Secondary | ICD-10-CM | POA: Diagnosis not present

## 2020-12-24 DIAGNOSIS — M150 Primary generalized (osteo)arthritis: Secondary | ICD-10-CM | POA: Diagnosis not present

## 2020-12-24 DIAGNOSIS — I509 Heart failure, unspecified: Secondary | ICD-10-CM | POA: Diagnosis not present

## 2020-12-24 DIAGNOSIS — I25719 Atherosclerosis of autologous vein coronary artery bypass graft(s) with unspecified angina pectoris: Secondary | ICD-10-CM | POA: Diagnosis not present

## 2020-12-24 DIAGNOSIS — M5136 Other intervertebral disc degeneration, lumbar region: Secondary | ICD-10-CM | POA: Diagnosis not present

## 2020-12-31 DIAGNOSIS — Z9181 History of falling: Secondary | ICD-10-CM | POA: Diagnosis not present

## 2020-12-31 DIAGNOSIS — M5136 Other intervertebral disc degeneration, lumbar region: Secondary | ICD-10-CM | POA: Diagnosis not present

## 2020-12-31 DIAGNOSIS — I25719 Atherosclerosis of autologous vein coronary artery bypass graft(s) with unspecified angina pectoris: Secondary | ICD-10-CM | POA: Diagnosis not present

## 2020-12-31 DIAGNOSIS — I110 Hypertensive heart disease with heart failure: Secondary | ICD-10-CM | POA: Diagnosis not present

## 2020-12-31 DIAGNOSIS — I509 Heart failure, unspecified: Secondary | ICD-10-CM | POA: Diagnosis not present

## 2020-12-31 DIAGNOSIS — I442 Atrioventricular block, complete: Secondary | ICD-10-CM | POA: Diagnosis not present

## 2020-12-31 DIAGNOSIS — Z951 Presence of aortocoronary bypass graft: Secondary | ICD-10-CM | POA: Diagnosis not present

## 2020-12-31 DIAGNOSIS — M150 Primary generalized (osteo)arthritis: Secondary | ICD-10-CM | POA: Diagnosis not present

## 2020-12-31 DIAGNOSIS — E119 Type 2 diabetes mellitus without complications: Secondary | ICD-10-CM | POA: Diagnosis not present

## 2021-01-09 DIAGNOSIS — I509 Heart failure, unspecified: Secondary | ICD-10-CM | POA: Diagnosis not present

## 2021-01-09 DIAGNOSIS — E119 Type 2 diabetes mellitus without complications: Secondary | ICD-10-CM | POA: Diagnosis not present

## 2021-01-09 DIAGNOSIS — M150 Primary generalized (osteo)arthritis: Secondary | ICD-10-CM | POA: Diagnosis not present

## 2021-01-09 DIAGNOSIS — I25719 Atherosclerosis of autologous vein coronary artery bypass graft(s) with unspecified angina pectoris: Secondary | ICD-10-CM | POA: Diagnosis not present

## 2021-01-09 DIAGNOSIS — M5136 Other intervertebral disc degeneration, lumbar region: Secondary | ICD-10-CM | POA: Diagnosis not present

## 2021-01-09 DIAGNOSIS — I110 Hypertensive heart disease with heart failure: Secondary | ICD-10-CM | POA: Diagnosis not present

## 2021-01-09 DIAGNOSIS — Z951 Presence of aortocoronary bypass graft: Secondary | ICD-10-CM | POA: Diagnosis not present

## 2021-01-09 DIAGNOSIS — I442 Atrioventricular block, complete: Secondary | ICD-10-CM | POA: Diagnosis not present

## 2021-01-09 DIAGNOSIS — Z9181 History of falling: Secondary | ICD-10-CM | POA: Diagnosis not present

## 2021-01-14 NOTE — Progress Notes (Signed)
Patient ID: Amber [name], female    DOB: 1929/10/07, 87 y.o.   MRN: [id number]  HPI  Amber [name] is a 87 y/o female with a history of hyperlipidemia, HTN, DM, CAD, chronic pain, CKD, arthritis, anxiety, anemia and chronic heart failure.   Echo report from 12/06/19 reviewed and showed an EF of 55-60% along with mild LVH, moderate LAE and mild MR. Reviewed echo done 01/27/17 which showed an EF of 40-45% along with mild MR. Previous echo done 07/27/16 showed an EF of 35-40% along with moderate MR/TR. This is essentially unchanged from a previous echo done August 2017.  Has not been admitted or been in the ED in the last 6 months.   She presents today for a follow-up visit with a chief complaint of minimal shortness of breath upon moderate exertion. She describes this as chronic in nature having been present for several years. She has associated fatigue, right ankle swelling, intermittent abdominal distention and difficulty sleeping along with this. She denies any dizziness, fatigue, chest pain, palpitations or weight gain.   Daughter that is present says that patient's BP at home has been quite low and patient is sleepy "all the time".   Past Medical History:  Diagnosis Date   Anemia    Anxiety    Arthritis    Benign neoplasm of colon    CHF (congestive heart failure) (HCC)    Chronic kidney disease    Chronic pain    Complication of anesthesia 1980's   hard time waking up   Coronary artery disease with unspecified angina pectoris    Cough    Diabetes mellitus without complication (HCC)    Essential hypertension    GERD (gastroesophageal reflux disease)    High risk medication use    Hyperlipidemia    Plantar fascial fibromatosis    Presence of permanent cardiac pacemaker    Shortness of breath dyspnea    Past Surgical History:  Procedure Laterality Date   ABDOMINAL HYSTERECTOMY     BACK SURGERY  1960's   cage and screws in lower back   CATARACT EXTRACTION W/ INTRAOCULAR  LENS  IMPLANT, BILATERAL Bilateral    CORONARY ARTERY BYPASS GRAFT  2008   triple   HARDWARE REMOVAL Right 12/08/2019   Procedure: HARDWARE REMOVAL;  Surgeon: Hessie Knows, MD;  Location: [facility];  Service: Orthopedics;  Laterality: Right;   JOINT REPLACEMENT Right    hip and knee   ORIF ANKLE FRACTURE Right 07/27/2014   Procedure: OPEN REDUCTION INTERNAL FIXATION (ORIF) ANKLE FRACTURE;  Surgeon: Alta Corning, MD;  Location: [place];  Service: Orthopedics;  Laterality: Right;   ORIF TIBIA FRACTURE Right 12/08/2019   Procedure: OPEN REDUCTION INTERNAL FIXATION (ORIF) TIBIA FRACTURE;  Surgeon: Hessie Knows, MD;  Location: [facility];  Service: Orthopedics;  Laterality: Right;   PACEMAKER INSERTION Left 03/14/2015   Procedure: INSERTION PACEMAKER;  Surgeon: Isaias Cowman, MD;  Location: [facility];  Service: Cardiovascular;  Laterality: Left;   PACEMAKER LEADLESS INSERTION N/A 01/24/2020   Procedure: PACEMAKER LEADLESS INSERTION;  Surgeon: Isaias Cowman, MD;  Location: [place] [facility];  Service: Cardiovascular;  Laterality: N/A;   ROTATOR CUFF REPAIR Left    TOTAL HIP ARTHROPLASTY Right    TOTAL HIP ARTHROPLASTY Left 02/09/2018   Procedure: TOTAL HIP ARTHROPLASTY ANTERIOR APPROACH;  Surgeon: Hessie Knows, MD;  Location: [facility];  Service: Orthopedics;  Laterality: Left;   TOTAL KNEE ARTHROPLASTY Right    VEIN LIGATION AND STRIPPING  Family History  Problem Relation Age of Onset   Heart attack Mother    Heart disease Father    Alzheimer's disease Sister    Cervical cancer Sister    Heart failure Son    Social History   Tobacco Use   Smoking status: Never   Smokeless tobacco: Never  Substance Use Topics   Alcohol use: No   Allergies  Allergen Reactions   Ambien [Zolpidem Tartrate] Other (See Comments)    Reaction:  Keeps pt awake    Penicillins Itching and Other (See Comments)    Has patient had a PCN reaction causing immediate rash, facial/tongue/throat swelling, SOB  or lightheadedness with hypotension: No Has patient had a PCN reaction causing severe rash involving mucus membranes or skin necrosis: No Has patient had a PCN reaction that required hospitalization No Has patient had a PCN reaction occurring within the last 10 years: No If all of the above answers are "NO", then may proceed with Cephalosporin use.   Iodine Itching   Succinylcholine Other (See Comments)    Reaction:  Unknown    Etodolac Other (See Comments)    GI upset   Nsaids Other (See Comments)    Reaction: gi upset   Prior to Admission medications   Medication Sig Start Date End Date Taking? Authorizing Provider  acetaminophen (TYLENOL) 500 MG tablet Take 1,000 mg by mouth every 6 (six) hours as needed for mild pain or headache.    Yes [provider]  albuterol (PROVENTIL HFA;VENTOLIN HFA) 108 (90 Base) MCG/ACT inhaler Inhale 2 puffs into the lungs every 6 (six) hours as needed for wheezing or shortness of breath. 01/28/17  Yes Wieting, Richard, MD  ALPRAZolam Duanne Moron) 1 MG tablet Take 1 tablet (1 mg total) by mouth 2 (two) times daily as needed for anxiety or sleep. Patient taking differently: Take 1 mg by mouth daily as needed for anxiety or sleep. 12/12/19  Yes Fritzi Mandes, MD  amLODipine (NORVASC) 5 MG tablet Take 5 mg by mouth daily.    Yes [provider]  apixaban (ELIQUIS) 5 MG TABS tablet Take 5 mg by mouth 2 (two) times daily.   Yes [provider]  calcium-vitamin D (OSCAL WITH D) 500-200 MG-UNIT tablet Take 1 tablet by mouth daily.   Yes [provider]  carvedilol (COREG) 6.25 MG tablet Take 1 tablet (6.25 mg total) by mouth 2 (two) times daily with a meal. 01/28/17  Yes Wieting, Richard, MD  furosemide (LASIX) 20 MG tablet Take 1 tablet (20 mg total) by mouth 2 (two) times daily. Patient taking differently: Take 20 mg by mouth daily. 02/27/20  Yes Para Skeans, MD  gabapentin (NEURONTIN) 100 MG capsule Take 200 mg by mouth 3 (three) times  daily.    Yes [provider]  insulin glargine (LANTUS) 100 UNIT/ML injection Inject 22 Units into the skin daily.   Yes [provider]  levETIRAcetam (KEPPRA) 500 MG tablet Take 500 mg by mouth 2 (two) times daily.   Yes [provider]  lisinopril (ZESTRIL) 20 MG tablet Take 1 tablet (20 mg total) by mouth daily. 02/27/20  Yes Para Skeans, MD  omeprazole (PRILOSEC) 40 MG capsule Take 40 mg by mouth daily.    Yes [provider]  pravastatin (PRAVACHOL) 40 MG tablet Take 40 mg by mouth at bedtime.   Yes [provider]  Multiple Vitamins-Minerals (PRESERVISION AREDS 2) CAPS Take 1 capsule by mouth daily.    [provider]   Review of Systems  Constitutional:  Positive for appetite change (decreased). Negative for fatigue.  HENT:  Negative for congestion, postnasal drip and sore throat.   Eyes: Negative.   Respiratory:  Positive for cough and shortness of breath ("little bit"). Negative for chest tightness.   Cardiovascular:  Positive for leg swelling (right ankle). Negative for chest pain and palpitations.  Gastrointestinal:  Positive for abdominal distention. Negative for abdominal pain.  Endocrine: Negative.   Genitourinary: Negative.   Musculoskeletal:  Positive for arthralgias (arms). Negative for back pain.  Skin: Negative.   Allergic/Immunologic: Negative.   Neurological:  Negative for dizziness and light-headedness.  Hematological:  Negative for adenopathy. Does not bruise/bleed easily.  Psychiatric/Behavioral:  Positive for sleep disturbance. Negative for dysphoric mood. The patient is not nervous/anxious.    Vitals:   01/15/21 1406 01/15/21 1434  BP: (!) 83/40 90/60  Pulse: 60   Resp: 16   SpO2: 93%   Weight: 172 lb 8 oz (78.2 kg)   Height: 4\' 11"  (1.499 m)    Wt Readings from Last 3 Encounters:  01/15/21 172 lb 8 oz (78.2 kg)  07/19/20 172 lb (78 kg)  03/08/20 160 lb (72.6 kg)   Lab Results  Component Value  Date   CREATININE 0.73 02/26/2020   CREATININE 0.73 02/24/2020   CREATININE 0.72 02/13/2020   Physical Exam Vitals and nursing note reviewed. Exam conducted with a chaperone present (daughter).  Constitutional:      Appearance: Normal appearance.  HENT:     Head: Normocephalic and atraumatic.  Cardiovascular:     Rate and Rhythm: Normal rate and regular rhythm.  Pulmonary:     Effort: Pulmonary effort is normal. No respiratory distress.     Breath sounds: No wheezing or rales.  Abdominal:     General: There is no distension.     Palpations: Abdomen is soft.     Tenderness: There is no abdominal tenderness.  Musculoskeletal:        General: No tenderness.     Cervical back: Normal range of motion and neck supple.     Right lower leg: No edema.     Left lower leg: No edema.  Skin:    General: Skin is warm and dry.  Neurological:     General: No focal deficit present.     Mental Status: She is alert and oriented to person, place, and time.  Psychiatric:        Mood and Affect: Mood normal.        Behavior: Behavior normal.    Assessment & Plan:  1: Chronic heart failure with preserved ejection fraction along with structural changes (LVH/LAE)- - NYHA class II - euvolemic - continue weighing daily. Reminded to call for an overnight weight gain of >2 pounds or a weekly weight gain of >5 pounds.  - weight unchanged from last visit here 6 months ago - not adding salt to her food and is trying to eat low sodium foods  - saw cardiology Amber [name]) 08/29/20 - saw pulmonology Amber [name]) 11/29/20 - BNP 02/24/20 was 223.3  2: HTN- - BP low even with recheck with manual cuff (90/60) - will stop amlodipine; advised patient to continue checking BP daily and call back next week with BP readings - saw PCP (Amber [name]) 11/23/20 - BMP from 11/23/20 reviewed and showed sodium 137, potassium 4.9, creatinine 1.0 and GFR 52  3: DM- - A1c 11/23/20 was 7.8% - glucose at home was 86  Medication  bottles reviewed.   Return in 3 months or sooner for any questions/problems before then.

## 2021-01-15 ENCOUNTER — Other Ambulatory Visit: Payer: Self-pay

## 2021-01-15 ENCOUNTER — Encounter: Payer: Self-pay | Admitting: Family

## 2021-01-15 ENCOUNTER — Ambulatory Visit: Payer: Medicare HMO | Attending: Family | Admitting: Family

## 2021-01-15 ENCOUNTER — Ambulatory Visit: Payer: Medicare HMO | Admitting: Family

## 2021-01-15 VITALS — BP 90/60 | HR 60 | Resp 16 | Ht 59.0 in | Wt 172.5 lb

## 2021-01-15 DIAGNOSIS — I5032 Chronic diastolic (congestive) heart failure: Secondary | ICD-10-CM | POA: Diagnosis not present

## 2021-01-15 DIAGNOSIS — Z794 Long term (current) use of insulin: Secondary | ICD-10-CM | POA: Insufficient documentation

## 2021-01-15 DIAGNOSIS — Z951 Presence of aortocoronary bypass graft: Secondary | ICD-10-CM | POA: Diagnosis not present

## 2021-01-15 DIAGNOSIS — N189 Chronic kidney disease, unspecified: Secondary | ICD-10-CM | POA: Insufficient documentation

## 2021-01-15 DIAGNOSIS — Z7901 Long term (current) use of anticoagulants: Secondary | ICD-10-CM | POA: Insufficient documentation

## 2021-01-15 DIAGNOSIS — E119 Type 2 diabetes mellitus without complications: Secondary | ICD-10-CM

## 2021-01-15 DIAGNOSIS — Z9181 History of falling: Secondary | ICD-10-CM | POA: Diagnosis not present

## 2021-01-15 DIAGNOSIS — E785 Hyperlipidemia, unspecified: Secondary | ICD-10-CM | POA: Diagnosis not present

## 2021-01-15 DIAGNOSIS — I509 Heart failure, unspecified: Secondary | ICD-10-CM | POA: Diagnosis not present

## 2021-01-15 DIAGNOSIS — Z8249 Family history of ischemic heart disease and other diseases of the circulatory system: Secondary | ICD-10-CM | POA: Insufficient documentation

## 2021-01-15 DIAGNOSIS — I130 Hypertensive heart and chronic kidney disease with heart failure and stage 1 through stage 4 chronic kidney disease, or unspecified chronic kidney disease: Secondary | ICD-10-CM | POA: Insufficient documentation

## 2021-01-15 DIAGNOSIS — Z950 Presence of cardiac pacemaker: Secondary | ICD-10-CM | POA: Diagnosis not present

## 2021-01-15 DIAGNOSIS — I10 Essential (primary) hypertension: Secondary | ICD-10-CM

## 2021-01-15 DIAGNOSIS — M5136 Other intervertebral disc degeneration, lumbar region: Secondary | ICD-10-CM | POA: Diagnosis not present

## 2021-01-15 DIAGNOSIS — M150 Primary generalized (osteo)arthritis: Secondary | ICD-10-CM | POA: Diagnosis not present

## 2021-01-15 DIAGNOSIS — I110 Hypertensive heart disease with heart failure: Secondary | ICD-10-CM | POA: Diagnosis not present

## 2021-01-15 DIAGNOSIS — I2510 Atherosclerotic heart disease of native coronary artery without angina pectoris: Secondary | ICD-10-CM | POA: Insufficient documentation

## 2021-01-15 DIAGNOSIS — I25719 Atherosclerosis of autologous vein coronary artery bypass graft(s) with unspecified angina pectoris: Secondary | ICD-10-CM | POA: Diagnosis not present

## 2021-01-15 DIAGNOSIS — I442 Atrioventricular block, complete: Secondary | ICD-10-CM | POA: Diagnosis not present

## 2021-01-15 DIAGNOSIS — E1122 Type 2 diabetes mellitus with diabetic chronic kidney disease: Secondary | ICD-10-CM | POA: Diagnosis not present

## 2021-01-15 NOTE — Patient Instructions (Addendum)
Continue weighing daily and call for an overnight weight gain of > 2 pounds or a weekly weight gain of >5 pounds.    Stop amlodipine.    Check blood pressure daily. Call me next week with blood pressure readings.

## 2021-01-17 DIAGNOSIS — E1165 Type 2 diabetes mellitus with hyperglycemia: Secondary | ICD-10-CM | POA: Diagnosis not present

## 2021-01-17 DIAGNOSIS — I480 Paroxysmal atrial fibrillation: Secondary | ICD-10-CM | POA: Diagnosis not present

## 2021-01-17 DIAGNOSIS — Z794 Long term (current) use of insulin: Secondary | ICD-10-CM | POA: Diagnosis not present

## 2021-01-18 DIAGNOSIS — E785 Hyperlipidemia, unspecified: Secondary | ICD-10-CM | POA: Diagnosis not present

## 2021-01-18 DIAGNOSIS — Z794 Long term (current) use of insulin: Secondary | ICD-10-CM | POA: Diagnosis not present

## 2021-01-18 DIAGNOSIS — E1169 Type 2 diabetes mellitus with other specified complication: Secondary | ICD-10-CM | POA: Diagnosis not present

## 2021-01-18 DIAGNOSIS — I152 Hypertension secondary to endocrine disorders: Secondary | ICD-10-CM | POA: Diagnosis not present

## 2021-01-18 DIAGNOSIS — E1159 Type 2 diabetes mellitus with other circulatory complications: Secondary | ICD-10-CM | POA: Diagnosis not present

## 2021-01-18 DIAGNOSIS — E1142 Type 2 diabetes mellitus with diabetic polyneuropathy: Secondary | ICD-10-CM | POA: Diagnosis not present

## 2021-01-22 DIAGNOSIS — I25719 Atherosclerosis of autologous vein coronary artery bypass graft(s) with unspecified angina pectoris: Secondary | ICD-10-CM | POA: Diagnosis not present

## 2021-01-22 DIAGNOSIS — Z9181 History of falling: Secondary | ICD-10-CM | POA: Diagnosis not present

## 2021-01-22 DIAGNOSIS — E119 Type 2 diabetes mellitus without complications: Secondary | ICD-10-CM | POA: Diagnosis not present

## 2021-01-22 DIAGNOSIS — M150 Primary generalized (osteo)arthritis: Secondary | ICD-10-CM | POA: Diagnosis not present

## 2021-01-22 DIAGNOSIS — Z951 Presence of aortocoronary bypass graft: Secondary | ICD-10-CM | POA: Diagnosis not present

## 2021-01-22 DIAGNOSIS — I110 Hypertensive heart disease with heart failure: Secondary | ICD-10-CM | POA: Diagnosis not present

## 2021-01-22 DIAGNOSIS — I442 Atrioventricular block, complete: Secondary | ICD-10-CM | POA: Diagnosis not present

## 2021-01-22 DIAGNOSIS — I509 Heart failure, unspecified: Secondary | ICD-10-CM | POA: Diagnosis not present

## 2021-01-22 DIAGNOSIS — M5136 Other intervertebral disc degeneration, lumbar region: Secondary | ICD-10-CM | POA: Diagnosis not present

## 2021-01-23 DIAGNOSIS — I442 Atrioventricular block, complete: Secondary | ICD-10-CM | POA: Diagnosis not present

## 2021-01-23 DIAGNOSIS — I509 Heart failure, unspecified: Secondary | ICD-10-CM | POA: Diagnosis not present

## 2021-01-23 DIAGNOSIS — Z951 Presence of aortocoronary bypass graft: Secondary | ICD-10-CM | POA: Diagnosis not present

## 2021-01-23 DIAGNOSIS — Z9181 History of falling: Secondary | ICD-10-CM | POA: Diagnosis not present

## 2021-01-23 DIAGNOSIS — M5136 Other intervertebral disc degeneration, lumbar region: Secondary | ICD-10-CM | POA: Diagnosis not present

## 2021-01-23 DIAGNOSIS — I110 Hypertensive heart disease with heart failure: Secondary | ICD-10-CM | POA: Diagnosis not present

## 2021-01-23 DIAGNOSIS — I25719 Atherosclerosis of autologous vein coronary artery bypass graft(s) with unspecified angina pectoris: Secondary | ICD-10-CM | POA: Diagnosis not present

## 2021-01-23 DIAGNOSIS — M150 Primary generalized (osteo)arthritis: Secondary | ICD-10-CM | POA: Diagnosis not present

## 2021-01-23 DIAGNOSIS — E119 Type 2 diabetes mellitus without complications: Secondary | ICD-10-CM | POA: Diagnosis not present

## 2021-01-28 ENCOUNTER — Telehealth: Payer: Self-pay | Admitting: Family

## 2021-01-28 MED ORDER — LISINOPRIL 20 MG PO TABS: 10.0000 mg | ORAL_TABLET | Freq: Every day | ORAL | 0 refills | Status: DC

## 2021-01-28 NOTE — Telephone Encounter (Signed)
Received phone call from patient's daughter [name] regarding patient's BP.   When patient was last here on 01/15/21, her BP was low so her amlodipine was stopped. Daughter confirms that patient has not been taking that.   BP continues to run 90/60 at home. No dizziness to report.   Advised daughter to decrease lisinopril to '10mg'$  daily (currently on '20mg'$ ). Advised daughter to cut the current tablets in half and patient is to take 1/2 tablet daily.   Continue checking BP and call back in a couple of weeks with updated BP readings. Daughter verbalized understanding

## 2021-01-29 DIAGNOSIS — M150 Primary generalized (osteo)arthritis: Secondary | ICD-10-CM | POA: Diagnosis not present

## 2021-01-29 DIAGNOSIS — I110 Hypertensive heart disease with heart failure: Secondary | ICD-10-CM | POA: Diagnosis not present

## 2021-01-29 DIAGNOSIS — I509 Heart failure, unspecified: Secondary | ICD-10-CM | POA: Diagnosis not present

## 2021-01-29 DIAGNOSIS — I25719 Atherosclerosis of autologous vein coronary artery bypass graft(s) with unspecified angina pectoris: Secondary | ICD-10-CM | POA: Diagnosis not present

## 2021-01-29 DIAGNOSIS — E119 Type 2 diabetes mellitus without complications: Secondary | ICD-10-CM | POA: Diagnosis not present

## 2021-01-29 DIAGNOSIS — Z951 Presence of aortocoronary bypass graft: Secondary | ICD-10-CM | POA: Diagnosis not present

## 2021-01-29 DIAGNOSIS — I442 Atrioventricular block, complete: Secondary | ICD-10-CM | POA: Diagnosis not present

## 2021-01-29 DIAGNOSIS — M5136 Other intervertebral disc degeneration, lumbar region: Secondary | ICD-10-CM | POA: Diagnosis not present

## 2021-01-29 DIAGNOSIS — Z9181 History of falling: Secondary | ICD-10-CM | POA: Diagnosis not present

## 2021-01-31 DIAGNOSIS — E119 Type 2 diabetes mellitus without complications: Secondary | ICD-10-CM | POA: Diagnosis not present

## 2021-01-31 DIAGNOSIS — Z951 Presence of aortocoronary bypass graft: Secondary | ICD-10-CM | POA: Diagnosis not present

## 2021-01-31 DIAGNOSIS — M150 Primary generalized (osteo)arthritis: Secondary | ICD-10-CM | POA: Diagnosis not present

## 2021-01-31 DIAGNOSIS — I509 Heart failure, unspecified: Secondary | ICD-10-CM | POA: Diagnosis not present

## 2021-01-31 DIAGNOSIS — Z9181 History of falling: Secondary | ICD-10-CM | POA: Diagnosis not present

## 2021-01-31 DIAGNOSIS — M5136 Other intervertebral disc degeneration, lumbar region: Secondary | ICD-10-CM | POA: Diagnosis not present

## 2021-01-31 DIAGNOSIS — I25719 Atherosclerosis of autologous vein coronary artery bypass graft(s) with unspecified angina pectoris: Secondary | ICD-10-CM | POA: Diagnosis not present

## 2021-01-31 DIAGNOSIS — I442 Atrioventricular block, complete: Secondary | ICD-10-CM | POA: Diagnosis not present

## 2021-01-31 DIAGNOSIS — I110 Hypertensive heart disease with heart failure: Secondary | ICD-10-CM | POA: Diagnosis not present

## 2021-02-05 DIAGNOSIS — E119 Type 2 diabetes mellitus without complications: Secondary | ICD-10-CM | POA: Diagnosis not present

## 2021-02-05 DIAGNOSIS — I110 Hypertensive heart disease with heart failure: Secondary | ICD-10-CM | POA: Diagnosis not present

## 2021-02-05 DIAGNOSIS — Z951 Presence of aortocoronary bypass graft: Secondary | ICD-10-CM | POA: Diagnosis not present

## 2021-02-05 DIAGNOSIS — Z9181 History of falling: Secondary | ICD-10-CM | POA: Diagnosis not present

## 2021-02-05 DIAGNOSIS — I509 Heart failure, unspecified: Secondary | ICD-10-CM | POA: Diagnosis not present

## 2021-02-05 DIAGNOSIS — M5136 Other intervertebral disc degeneration, lumbar region: Secondary | ICD-10-CM | POA: Diagnosis not present

## 2021-02-05 DIAGNOSIS — I442 Atrioventricular block, complete: Secondary | ICD-10-CM | POA: Diagnosis not present

## 2021-02-05 DIAGNOSIS — I25719 Atherosclerosis of autologous vein coronary artery bypass graft(s) with unspecified angina pectoris: Secondary | ICD-10-CM | POA: Diagnosis not present

## 2021-02-05 DIAGNOSIS — M150 Primary generalized (osteo)arthritis: Secondary | ICD-10-CM | POA: Diagnosis not present

## 2021-02-06 DIAGNOSIS — Z951 Presence of aortocoronary bypass graft: Secondary | ICD-10-CM | POA: Diagnosis not present

## 2021-02-06 DIAGNOSIS — I442 Atrioventricular block, complete: Secondary | ICD-10-CM | POA: Diagnosis not present

## 2021-02-06 DIAGNOSIS — E119 Type 2 diabetes mellitus without complications: Secondary | ICD-10-CM | POA: Diagnosis not present

## 2021-02-06 DIAGNOSIS — I110 Hypertensive heart disease with heart failure: Secondary | ICD-10-CM | POA: Diagnosis not present

## 2021-02-06 DIAGNOSIS — M5136 Other intervertebral disc degeneration, lumbar region: Secondary | ICD-10-CM | POA: Diagnosis not present

## 2021-02-06 DIAGNOSIS — I25719 Atherosclerosis of autologous vein coronary artery bypass graft(s) with unspecified angina pectoris: Secondary | ICD-10-CM | POA: Diagnosis not present

## 2021-02-06 DIAGNOSIS — M150 Primary generalized (osteo)arthritis: Secondary | ICD-10-CM | POA: Diagnosis not present

## 2021-02-06 DIAGNOSIS — I509 Heart failure, unspecified: Secondary | ICD-10-CM | POA: Diagnosis not present

## 2021-02-06 DIAGNOSIS — Z9181 History of falling: Secondary | ICD-10-CM | POA: Diagnosis not present

## 2021-02-07 DIAGNOSIS — Z951 Presence of aortocoronary bypass graft: Secondary | ICD-10-CM | POA: Diagnosis not present

## 2021-02-07 DIAGNOSIS — M5136 Other intervertebral disc degeneration, lumbar region: Secondary | ICD-10-CM | POA: Diagnosis not present

## 2021-02-07 DIAGNOSIS — I110 Hypertensive heart disease with heart failure: Secondary | ICD-10-CM | POA: Diagnosis not present

## 2021-02-07 DIAGNOSIS — I509 Heart failure, unspecified: Secondary | ICD-10-CM | POA: Diagnosis not present

## 2021-02-07 DIAGNOSIS — I442 Atrioventricular block, complete: Secondary | ICD-10-CM | POA: Diagnosis not present

## 2021-02-07 DIAGNOSIS — Z9181 History of falling: Secondary | ICD-10-CM | POA: Diagnosis not present

## 2021-02-07 DIAGNOSIS — M150 Primary generalized (osteo)arthritis: Secondary | ICD-10-CM | POA: Diagnosis not present

## 2021-02-07 DIAGNOSIS — I25719 Atherosclerosis of autologous vein coronary artery bypass graft(s) with unspecified angina pectoris: Secondary | ICD-10-CM | POA: Diagnosis not present

## 2021-02-07 DIAGNOSIS — E119 Type 2 diabetes mellitus without complications: Secondary | ICD-10-CM | POA: Diagnosis not present

## 2021-02-11 DIAGNOSIS — Z951 Presence of aortocoronary bypass graft: Secondary | ICD-10-CM | POA: Diagnosis not present

## 2021-02-11 DIAGNOSIS — M150 Primary generalized (osteo)arthritis: Secondary | ICD-10-CM | POA: Diagnosis not present

## 2021-02-11 DIAGNOSIS — I25719 Atherosclerosis of autologous vein coronary artery bypass graft(s) with unspecified angina pectoris: Secondary | ICD-10-CM | POA: Diagnosis not present

## 2021-02-11 DIAGNOSIS — I509 Heart failure, unspecified: Secondary | ICD-10-CM | POA: Diagnosis not present

## 2021-02-11 DIAGNOSIS — I110 Hypertensive heart disease with heart failure: Secondary | ICD-10-CM | POA: Diagnosis not present

## 2021-02-11 DIAGNOSIS — E119 Type 2 diabetes mellitus without complications: Secondary | ICD-10-CM | POA: Diagnosis not present

## 2021-02-11 DIAGNOSIS — Z9181 History of falling: Secondary | ICD-10-CM | POA: Diagnosis not present

## 2021-02-11 DIAGNOSIS — M5136 Other intervertebral disc degeneration, lumbar region: Secondary | ICD-10-CM | POA: Diagnosis not present

## 2021-02-11 DIAGNOSIS — I442 Atrioventricular block, complete: Secondary | ICD-10-CM | POA: Diagnosis not present

## 2021-02-12 DIAGNOSIS — I110 Hypertensive heart disease with heart failure: Secondary | ICD-10-CM | POA: Diagnosis not present

## 2021-02-12 DIAGNOSIS — I509 Heart failure, unspecified: Secondary | ICD-10-CM | POA: Diagnosis not present

## 2021-02-12 DIAGNOSIS — M5136 Other intervertebral disc degeneration, lumbar region: Secondary | ICD-10-CM | POA: Diagnosis not present

## 2021-02-12 DIAGNOSIS — I25719 Atherosclerosis of autologous vein coronary artery bypass graft(s) with unspecified angina pectoris: Secondary | ICD-10-CM | POA: Diagnosis not present

## 2021-02-12 DIAGNOSIS — M150 Primary generalized (osteo)arthritis: Secondary | ICD-10-CM | POA: Diagnosis not present

## 2021-02-12 DIAGNOSIS — I442 Atrioventricular block, complete: Secondary | ICD-10-CM | POA: Diagnosis not present

## 2021-02-12 DIAGNOSIS — Z9181 History of falling: Secondary | ICD-10-CM | POA: Diagnosis not present

## 2021-02-12 DIAGNOSIS — E119 Type 2 diabetes mellitus without complications: Secondary | ICD-10-CM | POA: Diagnosis not present

## 2021-02-12 DIAGNOSIS — Z951 Presence of aortocoronary bypass graft: Secondary | ICD-10-CM | POA: Diagnosis not present

## 2021-02-14 DIAGNOSIS — Z9181 History of falling: Secondary | ICD-10-CM | POA: Diagnosis not present

## 2021-02-14 DIAGNOSIS — E119 Type 2 diabetes mellitus without complications: Secondary | ICD-10-CM | POA: Diagnosis not present

## 2021-02-14 DIAGNOSIS — M5136 Other intervertebral disc degeneration, lumbar region: Secondary | ICD-10-CM | POA: Diagnosis not present

## 2021-02-14 DIAGNOSIS — M150 Primary generalized (osteo)arthritis: Secondary | ICD-10-CM | POA: Diagnosis not present

## 2021-02-14 DIAGNOSIS — Z951 Presence of aortocoronary bypass graft: Secondary | ICD-10-CM | POA: Diagnosis not present

## 2021-02-14 DIAGNOSIS — I442 Atrioventricular block, complete: Secondary | ICD-10-CM | POA: Diagnosis not present

## 2021-02-14 DIAGNOSIS — I509 Heart failure, unspecified: Secondary | ICD-10-CM | POA: Diagnosis not present

## 2021-02-14 DIAGNOSIS — I25719 Atherosclerosis of autologous vein coronary artery bypass graft(s) with unspecified angina pectoris: Secondary | ICD-10-CM | POA: Diagnosis not present

## 2021-02-14 DIAGNOSIS — I110 Hypertensive heart disease with heart failure: Secondary | ICD-10-CM | POA: Diagnosis not present

## 2021-02-19 DIAGNOSIS — I25719 Atherosclerosis of autologous vein coronary artery bypass graft(s) with unspecified angina pectoris: Secondary | ICD-10-CM | POA: Diagnosis not present

## 2021-02-19 DIAGNOSIS — I442 Atrioventricular block, complete: Secondary | ICD-10-CM | POA: Diagnosis not present

## 2021-02-19 DIAGNOSIS — Z9181 History of falling: Secondary | ICD-10-CM | POA: Diagnosis not present

## 2021-02-19 DIAGNOSIS — M5136 Other intervertebral disc degeneration, lumbar region: Secondary | ICD-10-CM | POA: Diagnosis not present

## 2021-02-19 DIAGNOSIS — I110 Hypertensive heart disease with heart failure: Secondary | ICD-10-CM | POA: Diagnosis not present

## 2021-02-19 DIAGNOSIS — Z951 Presence of aortocoronary bypass graft: Secondary | ICD-10-CM | POA: Diagnosis not present

## 2021-02-19 DIAGNOSIS — I509 Heart failure, unspecified: Secondary | ICD-10-CM | POA: Diagnosis not present

## 2021-02-19 DIAGNOSIS — M150 Primary generalized (osteo)arthritis: Secondary | ICD-10-CM | POA: Diagnosis not present

## 2021-02-19 DIAGNOSIS — E119 Type 2 diabetes mellitus without complications: Secondary | ICD-10-CM | POA: Diagnosis not present

## 2021-02-21 DIAGNOSIS — E119 Type 2 diabetes mellitus without complications: Secondary | ICD-10-CM | POA: Diagnosis not present

## 2021-02-21 DIAGNOSIS — Z794 Long term (current) use of insulin: Secondary | ICD-10-CM | POA: Diagnosis not present

## 2021-02-21 DIAGNOSIS — J449 Chronic obstructive pulmonary disease, unspecified: Secondary | ICD-10-CM | POA: Diagnosis not present

## 2021-02-22 DIAGNOSIS — I509 Heart failure, unspecified: Secondary | ICD-10-CM | POA: Diagnosis not present

## 2021-02-22 DIAGNOSIS — M5136 Other intervertebral disc degeneration, lumbar region: Secondary | ICD-10-CM | POA: Diagnosis not present

## 2021-02-22 DIAGNOSIS — E119 Type 2 diabetes mellitus without complications: Secondary | ICD-10-CM | POA: Diagnosis not present

## 2021-02-22 DIAGNOSIS — Z951 Presence of aortocoronary bypass graft: Secondary | ICD-10-CM | POA: Diagnosis not present

## 2021-02-22 DIAGNOSIS — I110 Hypertensive heart disease with heart failure: Secondary | ICD-10-CM | POA: Diagnosis not present

## 2021-02-22 DIAGNOSIS — Z9181 History of falling: Secondary | ICD-10-CM | POA: Diagnosis not present

## 2021-02-22 DIAGNOSIS — I25719 Atherosclerosis of autologous vein coronary artery bypass graft(s) with unspecified angina pectoris: Secondary | ICD-10-CM | POA: Diagnosis not present

## 2021-02-22 DIAGNOSIS — I442 Atrioventricular block, complete: Secondary | ICD-10-CM | POA: Diagnosis not present

## 2021-02-22 DIAGNOSIS — M150 Primary generalized (osteo)arthritis: Secondary | ICD-10-CM | POA: Diagnosis not present

## 2021-02-27 DIAGNOSIS — E119 Type 2 diabetes mellitus without complications: Secondary | ICD-10-CM | POA: Diagnosis not present

## 2021-02-27 DIAGNOSIS — I509 Heart failure, unspecified: Secondary | ICD-10-CM | POA: Diagnosis not present

## 2021-02-27 DIAGNOSIS — Z951 Presence of aortocoronary bypass graft: Secondary | ICD-10-CM | POA: Diagnosis not present

## 2021-02-27 DIAGNOSIS — M150 Primary generalized (osteo)arthritis: Secondary | ICD-10-CM | POA: Diagnosis not present

## 2021-02-27 DIAGNOSIS — I110 Hypertensive heart disease with heart failure: Secondary | ICD-10-CM | POA: Diagnosis not present

## 2021-02-27 DIAGNOSIS — I442 Atrioventricular block, complete: Secondary | ICD-10-CM | POA: Diagnosis not present

## 2021-02-27 DIAGNOSIS — M5136 Other intervertebral disc degeneration, lumbar region: Secondary | ICD-10-CM | POA: Diagnosis not present

## 2021-02-27 DIAGNOSIS — I25719 Atherosclerosis of autologous vein coronary artery bypass graft(s) with unspecified angina pectoris: Secondary | ICD-10-CM | POA: Diagnosis not present

## 2021-02-27 DIAGNOSIS — Z9181 History of falling: Secondary | ICD-10-CM | POA: Diagnosis not present

## 2021-03-06 DIAGNOSIS — Z951 Presence of aortocoronary bypass graft: Secondary | ICD-10-CM | POA: Diagnosis not present

## 2021-03-06 DIAGNOSIS — M150 Primary generalized (osteo)arthritis: Secondary | ICD-10-CM | POA: Diagnosis not present

## 2021-03-06 DIAGNOSIS — E119 Type 2 diabetes mellitus without complications: Secondary | ICD-10-CM | POA: Diagnosis not present

## 2021-03-06 DIAGNOSIS — I110 Hypertensive heart disease with heart failure: Secondary | ICD-10-CM | POA: Diagnosis not present

## 2021-03-06 DIAGNOSIS — Z9181 History of falling: Secondary | ICD-10-CM | POA: Diagnosis not present

## 2021-03-06 DIAGNOSIS — I509 Heart failure, unspecified: Secondary | ICD-10-CM | POA: Diagnosis not present

## 2021-03-06 DIAGNOSIS — I25719 Atherosclerosis of autologous vein coronary artery bypass graft(s) with unspecified angina pectoris: Secondary | ICD-10-CM | POA: Diagnosis not present

## 2021-03-06 DIAGNOSIS — M5136 Other intervertebral disc degeneration, lumbar region: Secondary | ICD-10-CM | POA: Diagnosis not present

## 2021-03-06 DIAGNOSIS — I442 Atrioventricular block, complete: Secondary | ICD-10-CM | POA: Diagnosis not present

## 2021-03-13 DIAGNOSIS — I2510 Atherosclerotic heart disease of native coronary artery without angina pectoris: Secondary | ICD-10-CM | POA: Diagnosis not present

## 2021-03-13 DIAGNOSIS — Z79899 Other long term (current) drug therapy: Secondary | ICD-10-CM | POA: Diagnosis not present

## 2021-03-13 DIAGNOSIS — Z23 Encounter for immunization: Secondary | ICD-10-CM | POA: Diagnosis not present

## 2021-03-13 DIAGNOSIS — Z794 Long term (current) use of insulin: Secondary | ICD-10-CM | POA: Diagnosis not present

## 2021-03-13 DIAGNOSIS — E1142 Type 2 diabetes mellitus with diabetic polyneuropathy: Secondary | ICD-10-CM | POA: Diagnosis not present

## 2021-03-13 DIAGNOSIS — I10 Essential (primary) hypertension: Secondary | ICD-10-CM | POA: Diagnosis not present

## 2021-03-13 DIAGNOSIS — E785 Hyperlipidemia, unspecified: Secondary | ICD-10-CM | POA: Diagnosis not present

## 2021-03-13 DIAGNOSIS — E1169 Type 2 diabetes mellitus with other specified complication: Secondary | ICD-10-CM | POA: Diagnosis not present

## 2021-03-13 DIAGNOSIS — Z950 Presence of cardiac pacemaker: Secondary | ICD-10-CM | POA: Diagnosis not present

## 2021-03-14 DIAGNOSIS — Z951 Presence of aortocoronary bypass graft: Secondary | ICD-10-CM | POA: Diagnosis not present

## 2021-03-14 DIAGNOSIS — E119 Type 2 diabetes mellitus without complications: Secondary | ICD-10-CM | POA: Diagnosis not present

## 2021-03-14 DIAGNOSIS — I110 Hypertensive heart disease with heart failure: Secondary | ICD-10-CM | POA: Diagnosis not present

## 2021-03-14 DIAGNOSIS — I25719 Atherosclerosis of autologous vein coronary artery bypass graft(s) with unspecified angina pectoris: Secondary | ICD-10-CM | POA: Diagnosis not present

## 2021-03-14 DIAGNOSIS — I509 Heart failure, unspecified: Secondary | ICD-10-CM | POA: Diagnosis not present

## 2021-03-14 DIAGNOSIS — I442 Atrioventricular block, complete: Secondary | ICD-10-CM | POA: Diagnosis not present

## 2021-03-14 DIAGNOSIS — M5136 Other intervertebral disc degeneration, lumbar region: Secondary | ICD-10-CM | POA: Diagnosis not present

## 2021-03-14 DIAGNOSIS — M150 Primary generalized (osteo)arthritis: Secondary | ICD-10-CM | POA: Diagnosis not present

## 2021-03-14 DIAGNOSIS — Z9181 History of falling: Secondary | ICD-10-CM | POA: Diagnosis not present

## 2021-03-19 DIAGNOSIS — Z951 Presence of aortocoronary bypass graft: Secondary | ICD-10-CM | POA: Diagnosis not present

## 2021-03-19 DIAGNOSIS — Z9181 History of falling: Secondary | ICD-10-CM | POA: Diagnosis not present

## 2021-03-19 DIAGNOSIS — M5136 Other intervertebral disc degeneration, lumbar region: Secondary | ICD-10-CM | POA: Diagnosis not present

## 2021-03-19 DIAGNOSIS — I442 Atrioventricular block, complete: Secondary | ICD-10-CM | POA: Diagnosis not present

## 2021-03-19 DIAGNOSIS — E119 Type 2 diabetes mellitus without complications: Secondary | ICD-10-CM | POA: Diagnosis not present

## 2021-03-19 DIAGNOSIS — I25719 Atherosclerosis of autologous vein coronary artery bypass graft(s) with unspecified angina pectoris: Secondary | ICD-10-CM | POA: Diagnosis not present

## 2021-03-19 DIAGNOSIS — I509 Heart failure, unspecified: Secondary | ICD-10-CM | POA: Diagnosis not present

## 2021-03-19 DIAGNOSIS — I110 Hypertensive heart disease with heart failure: Secondary | ICD-10-CM | POA: Diagnosis not present

## 2021-03-19 DIAGNOSIS — M150 Primary generalized (osteo)arthritis: Secondary | ICD-10-CM | POA: Diagnosis not present

## 2021-03-24 DIAGNOSIS — M5136 Other intervertebral disc degeneration, lumbar region: Secondary | ICD-10-CM | POA: Diagnosis not present

## 2021-03-24 DIAGNOSIS — Z951 Presence of aortocoronary bypass graft: Secondary | ICD-10-CM | POA: Diagnosis not present

## 2021-03-24 DIAGNOSIS — Z9181 History of falling: Secondary | ICD-10-CM | POA: Diagnosis not present

## 2021-03-24 DIAGNOSIS — M150 Primary generalized (osteo)arthritis: Secondary | ICD-10-CM | POA: Diagnosis not present

## 2021-03-24 DIAGNOSIS — I509 Heart failure, unspecified: Secondary | ICD-10-CM | POA: Diagnosis not present

## 2021-03-24 DIAGNOSIS — I25719 Atherosclerosis of autologous vein coronary artery bypass graft(s) with unspecified angina pectoris: Secondary | ICD-10-CM | POA: Diagnosis not present

## 2021-03-24 DIAGNOSIS — E119 Type 2 diabetes mellitus without complications: Secondary | ICD-10-CM | POA: Diagnosis not present

## 2021-03-24 DIAGNOSIS — I110 Hypertensive heart disease with heart failure: Secondary | ICD-10-CM | POA: Diagnosis not present

## 2021-03-24 DIAGNOSIS — I442 Atrioventricular block, complete: Secondary | ICD-10-CM | POA: Diagnosis not present

## 2021-03-27 DIAGNOSIS — E119 Type 2 diabetes mellitus without complications: Secondary | ICD-10-CM | POA: Diagnosis not present

## 2021-03-27 DIAGNOSIS — Z951 Presence of aortocoronary bypass graft: Secondary | ICD-10-CM | POA: Diagnosis not present

## 2021-03-27 DIAGNOSIS — Z9181 History of falling: Secondary | ICD-10-CM | POA: Diagnosis not present

## 2021-03-27 DIAGNOSIS — I110 Hypertensive heart disease with heart failure: Secondary | ICD-10-CM | POA: Diagnosis not present

## 2021-03-27 DIAGNOSIS — I25719 Atherosclerosis of autologous vein coronary artery bypass graft(s) with unspecified angina pectoris: Secondary | ICD-10-CM | POA: Diagnosis not present

## 2021-03-27 DIAGNOSIS — M5136 Other intervertebral disc degeneration, lumbar region: Secondary | ICD-10-CM | POA: Diagnosis not present

## 2021-03-27 DIAGNOSIS — M150 Primary generalized (osteo)arthritis: Secondary | ICD-10-CM | POA: Diagnosis not present

## 2021-03-27 DIAGNOSIS — I509 Heart failure, unspecified: Secondary | ICD-10-CM | POA: Diagnosis not present

## 2021-03-27 DIAGNOSIS — I442 Atrioventricular block, complete: Secondary | ICD-10-CM | POA: Diagnosis not present

## 2021-04-06 DIAGNOSIS — I442 Atrioventricular block, complete: Secondary | ICD-10-CM | POA: Diagnosis not present

## 2021-04-06 DIAGNOSIS — I509 Heart failure, unspecified: Secondary | ICD-10-CM | POA: Diagnosis not present

## 2021-04-06 DIAGNOSIS — Z9181 History of falling: Secondary | ICD-10-CM | POA: Diagnosis not present

## 2021-04-06 DIAGNOSIS — M150 Primary generalized (osteo)arthritis: Secondary | ICD-10-CM | POA: Diagnosis not present

## 2021-04-06 DIAGNOSIS — Z951 Presence of aortocoronary bypass graft: Secondary | ICD-10-CM | POA: Diagnosis not present

## 2021-04-06 DIAGNOSIS — E119 Type 2 diabetes mellitus without complications: Secondary | ICD-10-CM | POA: Diagnosis not present

## 2021-04-06 DIAGNOSIS — I25719 Atherosclerosis of autologous vein coronary artery bypass graft(s) with unspecified angina pectoris: Secondary | ICD-10-CM | POA: Diagnosis not present

## 2021-04-06 DIAGNOSIS — M5136 Other intervertebral disc degeneration, lumbar region: Secondary | ICD-10-CM | POA: Diagnosis not present

## 2021-04-06 DIAGNOSIS — I110 Hypertensive heart disease with heart failure: Secondary | ICD-10-CM | POA: Diagnosis not present

## 2021-04-11 ENCOUNTER — Observation Stay: Payer: Medicare HMO

## 2021-04-11 ENCOUNTER — Inpatient Hospital Stay
Admission: EM | Admit: 2021-04-11 | Discharge: 2021-04-16 | DRG: 291 | Disposition: A | Discharge status: Skilled Nursing Facility | Payer: Medicare HMO | Attending: Internal Medicine | Admitting: Internal Medicine

## 2021-04-11 ENCOUNTER — Emergency Department: Payer: Medicare HMO

## 2021-04-11 ENCOUNTER — Other Ambulatory Visit: Payer: Self-pay

## 2021-04-11 DIAGNOSIS — Z96643 Presence of artificial hip joint, bilateral: Secondary | ICD-10-CM | POA: Diagnosis present

## 2021-04-11 DIAGNOSIS — M722 Plantar fascial fibromatosis: Secondary | ICD-10-CM | POA: Diagnosis present

## 2021-04-11 DIAGNOSIS — E1142 Type 2 diabetes mellitus with diabetic polyneuropathy: Secondary | ICD-10-CM | POA: Diagnosis present

## 2021-04-11 DIAGNOSIS — Z79899 Other long term (current) drug therapy: Secondary | ICD-10-CM

## 2021-04-11 DIAGNOSIS — R5381 Other malaise: Secondary | ICD-10-CM | POA: Diagnosis not present

## 2021-04-11 DIAGNOSIS — Z20822 Contact with and (suspected) exposure to covid-19: Secondary | ICD-10-CM | POA: Diagnosis present

## 2021-04-11 DIAGNOSIS — R4189 Other symptoms and signs involving cognitive functions and awareness: Secondary | ICD-10-CM

## 2021-04-11 DIAGNOSIS — Z794 Long term (current) use of insulin: Secondary | ICD-10-CM

## 2021-04-11 DIAGNOSIS — R41841 Cognitive communication deficit: Secondary | ICD-10-CM | POA: Diagnosis not present

## 2021-04-11 DIAGNOSIS — G40909 Epilepsy, unspecified, not intractable, without status epilepticus: Secondary | ICD-10-CM | POA: Diagnosis present

## 2021-04-11 DIAGNOSIS — R29818 Other symptoms and signs involving the nervous system: Secondary | ICD-10-CM | POA: Diagnosis not present

## 2021-04-11 DIAGNOSIS — Z8249 Family history of ischemic heart disease and other diseases of the circulatory system: Secondary | ICD-10-CM

## 2021-04-11 DIAGNOSIS — E785 Hyperlipidemia, unspecified: Secondary | ICD-10-CM | POA: Diagnosis present

## 2021-04-11 DIAGNOSIS — G4733 Obstructive sleep apnea (adult) (pediatric): Secondary | ICD-10-CM | POA: Diagnosis present

## 2021-04-11 DIAGNOSIS — R9082 White matter disease, unspecified: Secondary | ICD-10-CM | POA: Diagnosis not present

## 2021-04-11 DIAGNOSIS — F419 Anxiety disorder, unspecified: Secondary | ICD-10-CM | POA: Diagnosis present

## 2021-04-11 DIAGNOSIS — M6281 Muscle weakness (generalized): Secondary | ICD-10-CM | POA: Diagnosis not present

## 2021-04-11 DIAGNOSIS — I2510 Atherosclerotic heart disease of native coronary artery without angina pectoris: Secondary | ICD-10-CM | POA: Diagnosis present

## 2021-04-11 DIAGNOSIS — K5730 Diverticulosis of large intestine without perforation or abscess without bleeding: Secondary | ICD-10-CM | POA: Diagnosis not present

## 2021-04-11 DIAGNOSIS — R410 Disorientation, unspecified: Secondary | ICD-10-CM | POA: Diagnosis not present

## 2021-04-11 DIAGNOSIS — Z9841 Cataract extraction status, right eye: Secondary | ICD-10-CM

## 2021-04-11 DIAGNOSIS — J9601 Acute respiratory failure with hypoxia: Secondary | ICD-10-CM | POA: Diagnosis not present

## 2021-04-11 DIAGNOSIS — R0602 Shortness of breath: Secondary | ICD-10-CM | POA: Diagnosis present

## 2021-04-11 DIAGNOSIS — I5043 Acute on chronic combined systolic (congestive) and diastolic (congestive) heart failure: Secondary | ICD-10-CM | POA: Diagnosis present

## 2021-04-11 DIAGNOSIS — J90 Pleural effusion, not elsewhere classified: Secondary | ICD-10-CM | POA: Diagnosis not present

## 2021-04-11 DIAGNOSIS — Z8673 Personal history of transient ischemic attack (TIA), and cerebral infarction without residual deficits: Secondary | ICD-10-CM

## 2021-04-11 DIAGNOSIS — Z961 Presence of intraocular lens: Secondary | ICD-10-CM | POA: Diagnosis present

## 2021-04-11 DIAGNOSIS — Z880 Allergy status to penicillin: Secondary | ICD-10-CM

## 2021-04-11 DIAGNOSIS — K449 Diaphragmatic hernia without obstruction or gangrene: Secondary | ICD-10-CM | POA: Diagnosis not present

## 2021-04-11 DIAGNOSIS — I5023 Acute on chronic systolic (congestive) heart failure: Secondary | ICD-10-CM | POA: Diagnosis not present

## 2021-04-11 DIAGNOSIS — Z888 Allergy status to other drugs, medicaments and biological substances status: Secondary | ICD-10-CM

## 2021-04-11 DIAGNOSIS — J9811 Atelectasis: Secondary | ICD-10-CM | POA: Diagnosis not present

## 2021-04-11 DIAGNOSIS — R4182 Altered mental status, unspecified: Secondary | ICD-10-CM | POA: Diagnosis present

## 2021-04-11 DIAGNOSIS — G928 Other toxic encephalopathy: Secondary | ICD-10-CM | POA: Diagnosis present

## 2021-04-11 DIAGNOSIS — I495 Sick sinus syndrome: Secondary | ICD-10-CM | POA: Diagnosis present

## 2021-04-11 DIAGNOSIS — R0689 Other abnormalities of breathing: Secondary | ICD-10-CM | POA: Diagnosis not present

## 2021-04-11 DIAGNOSIS — F445 Conversion disorder with seizures or convulsions: Secondary | ICD-10-CM | POA: Diagnosis not present

## 2021-04-11 DIAGNOSIS — R1312 Dysphagia, oropharyngeal phase: Secondary | ICD-10-CM | POA: Diagnosis not present

## 2021-04-11 DIAGNOSIS — E119 Type 2 diabetes mellitus without complications: Secondary | ICD-10-CM | POA: Diagnosis not present

## 2021-04-11 DIAGNOSIS — R0902 Hypoxemia: Secondary | ICD-10-CM | POA: Diagnosis not present

## 2021-04-11 DIAGNOSIS — Z950 Presence of cardiac pacemaker: Secondary | ICD-10-CM | POA: Diagnosis present

## 2021-04-11 DIAGNOSIS — E1165 Type 2 diabetes mellitus with hyperglycemia: Secondary | ICD-10-CM | POA: Diagnosis present

## 2021-04-11 DIAGNOSIS — R279 Unspecified lack of coordination: Secondary | ICD-10-CM | POA: Diagnosis not present

## 2021-04-11 DIAGNOSIS — I110 Hypertensive heart disease with heart failure: Principal | ICD-10-CM | POA: Diagnosis present

## 2021-04-11 DIAGNOSIS — I442 Atrioventricular block, complete: Secondary | ICD-10-CM | POA: Diagnosis present

## 2021-04-11 DIAGNOSIS — G894 Chronic pain syndrome: Secondary | ICD-10-CM | POA: Diagnosis present

## 2021-04-11 DIAGNOSIS — E1122 Type 2 diabetes mellitus with diabetic chronic kidney disease: Secondary | ICD-10-CM | POA: Diagnosis present

## 2021-04-11 DIAGNOSIS — K219 Gastro-esophageal reflux disease without esophagitis: Secondary | ICD-10-CM | POA: Diagnosis present

## 2021-04-11 DIAGNOSIS — I509 Heart failure, unspecified: Secondary | ICD-10-CM

## 2021-04-11 DIAGNOSIS — Z951 Presence of aortocoronary bypass graft: Secondary | ICD-10-CM

## 2021-04-11 DIAGNOSIS — M47814 Spondylosis without myelopathy or radiculopathy, thoracic region: Secondary | ICD-10-CM | POA: Diagnosis not present

## 2021-04-11 DIAGNOSIS — I214 Non-ST elevation (NSTEMI) myocardial infarction: Secondary | ICD-10-CM | POA: Diagnosis not present

## 2021-04-11 DIAGNOSIS — R5383 Other fatigue: Secondary | ICD-10-CM | POA: Diagnosis not present

## 2021-04-11 DIAGNOSIS — G459 Transient cerebral ischemic attack, unspecified: Secondary | ICD-10-CM | POA: Diagnosis present

## 2021-04-11 DIAGNOSIS — R404 Transient alteration of awareness: Secondary | ICD-10-CM | POA: Diagnosis not present

## 2021-04-11 DIAGNOSIS — I6523 Occlusion and stenosis of bilateral carotid arteries: Secondary | ICD-10-CM | POA: Diagnosis not present

## 2021-04-11 DIAGNOSIS — Z66 Do not resuscitate: Secondary | ICD-10-CM | POA: Diagnosis present

## 2021-04-11 DIAGNOSIS — J449 Chronic obstructive pulmonary disease, unspecified: Secondary | ICD-10-CM | POA: Diagnosis present

## 2021-04-11 DIAGNOSIS — I4891 Unspecified atrial fibrillation: Secondary | ICD-10-CM | POA: Diagnosis present

## 2021-04-11 DIAGNOSIS — Z7901 Long term (current) use of anticoagulants: Secondary | ICD-10-CM

## 2021-04-11 DIAGNOSIS — I517 Cardiomegaly: Secondary | ICD-10-CM | POA: Diagnosis not present

## 2021-04-11 DIAGNOSIS — I10 Essential (primary) hypertension: Secondary | ICD-10-CM | POA: Diagnosis present

## 2021-04-11 DIAGNOSIS — Z9842 Cataract extraction status, left eye: Secondary | ICD-10-CM

## 2021-04-11 DIAGNOSIS — Z8679 Personal history of other diseases of the circulatory system: Secondary | ICD-10-CM

## 2021-04-11 DIAGNOSIS — J9621 Acute and chronic respiratory failure with hypoxia: Secondary | ICD-10-CM

## 2021-04-11 DIAGNOSIS — I9589 Other hypotension: Secondary | ICD-10-CM | POA: Diagnosis not present

## 2021-04-11 DIAGNOSIS — G319 Degenerative disease of nervous system, unspecified: Secondary | ICD-10-CM | POA: Diagnosis not present

## 2021-04-11 LAB — URINE DRUG SCREEN, QUALITATIVE (ARMC ONLY)
Amphetamines, Ur Screen: NOT DETECTED
Barbiturates, Ur Screen: NOT DETECTED
Benzodiazepine, Ur Scrn: NOT DETECTED
Cannabinoid 50 Ng, Ur ~~LOC~~: NOT DETECTED
Cocaine Metabolite,Ur ~~LOC~~: NOT DETECTED
MDMA (Ecstasy)Ur Screen: NOT DETECTED
Methadone Scn, Ur: NOT DETECTED
Opiate, Ur Screen: NOT DETECTED
Phencyclidine (PCP) Ur S: NOT DETECTED
Tricyclic, Ur Screen: NOT DETECTED

## 2021-04-11 LAB — CBC WITH DIFFERENTIAL/PLATELET
Abs Immature Granulocytes: 0.06 10*3/uL (ref 0.00–0.07)
Basophils Absolute: 0 10*3/uL (ref 0.0–0.1)
Basophils Relative: 0 %
Eosinophils Absolute: 0 10*3/uL (ref 0.0–0.5)
Eosinophils Relative: 0 %
HCT: 35.5 % — ABNORMAL LOW (ref 36.0–46.0)
Hemoglobin: 11.7 g/dL — ABNORMAL LOW (ref 12.0–15.0)
Immature Granulocytes: 1 %
Lymphocytes Relative: 10 %
Lymphs Abs: 1 10*3/uL (ref 0.7–4.0)
MCH: 27.3 pg (ref 26.0–34.0)
MCHC: 33 g/dL (ref 30.0–36.0)
MCV: 82.8 fL (ref 80.0–100.0)
Monocytes Absolute: 1 10*3/uL (ref 0.1–1.0)
Monocytes Relative: 10 %
Neutro Abs: 7.5 10*3/uL (ref 1.7–7.7)
Neutrophils Relative %: 79 %
Platelets: 321 10*3/uL (ref 150–400)
RBC: 4.29 MIL/uL (ref 3.87–5.11)
RDW: 17.3 % — ABNORMAL HIGH (ref 11.5–15.5)
WBC: 9.5 10*3/uL (ref 4.0–10.5)
nRBC: 0 % (ref 0.0–0.2)

## 2021-04-11 LAB — BASIC METABOLIC PANEL
Anion gap: 16 — ABNORMAL HIGH (ref 5–15)
BUN: 18 mg/dL (ref 8–23)
CO2: 25 mmol/L (ref 22–32)
Calcium: 9.7 mg/dL (ref 8.9–10.3)
Chloride: 92 mmol/L — ABNORMAL LOW (ref 98–111)
Creatinine, Ser: 0.72 mg/dL (ref 0.44–1.00)
GFR, Estimated: >60 mL/min (ref >60)
Glucose, Bld: 75 mg/dL (ref 70–99)
Potassium: 3.7 mmol/L (ref 3.5–5.1)
Sodium: 133 mmol/L — ABNORMAL LOW (ref 135–145)

## 2021-04-11 LAB — URINALYSIS, ROUTINE W REFLEX MICROSCOPIC
Bacteria, UA: NONE SEEN
Bilirubin Urine: NEGATIVE
Glucose, UA: NEGATIVE mg/dL
Ketones, ur: NEGATIVE mg/dL
Leukocytes,Ua: NEGATIVE
Nitrite: NEGATIVE
Protein, ur: NEGATIVE mg/dL
Specific Gravity, Urine: 1.01 (ref 1.005–1.030)
Squamous Epithelial / HPF: NONE SEEN (ref 0–5)
pH: 7 (ref 5.0–8.0)

## 2021-04-11 LAB — HEPATIC FUNCTION PANEL
ALT: 21 U/L (ref 0–44)
AST: 45 U/L — ABNORMAL HIGH (ref 15–41)
Albumin: 3.8 g/dL (ref 3.5–5.0)
Alkaline Phosphatase: 75 U/L (ref 38–126)
Bilirubin, Direct: 0.3 mg/dL — ABNORMAL HIGH (ref 0.0–0.2)
Indirect Bilirubin: 1.1 mg/dL — ABNORMAL HIGH (ref 0.3–0.9)
Total Bilirubin: 1.4 mg/dL — ABNORMAL HIGH (ref 0.3–1.2)
Total Protein: 7.4 g/dL (ref 6.5–8.1)

## 2021-04-11 LAB — TROPONIN I (HIGH SENSITIVITY)
Troponin I (High Sensitivity): 82 ng/L — ABNORMAL HIGH (ref <18)
Troponin I (High Sensitivity): 91 ng/L — ABNORMAL HIGH (ref <18)

## 2021-04-11 LAB — LACTIC ACID, PLASMA: Lactic Acid, Venous: 1.8 mmol/L (ref 0.5–1.9)

## 2021-04-11 LAB — RESP PANEL BY RT-PCR (FLU A&B, COVID) ARPGX2
Influenza A by PCR: NEGATIVE
Influenza B by PCR: NEGATIVE
SARS Coronavirus 2 by RT PCR: NEGATIVE

## 2021-04-11 LAB — TSH: TSH: 2.352 u[IU]/mL (ref 0.350–4.500)

## 2021-04-11 LAB — AMMONIA: Ammonia: <10 umol/L (ref 9–35)

## 2021-04-11 LAB — T4, FREE: Free T4: 1.38 ng/dL — ABNORMAL HIGH (ref 0.61–1.12)

## 2021-04-11 LAB — GLUCOSE, CAPILLARY: Glucose-Capillary: 101 mg/dL — ABNORMAL HIGH (ref 70–99)

## 2021-04-11 LAB — PROCALCITONIN: Procalcitonin: <0.1 ng/mL

## 2021-04-11 LAB — VITAMIN B12: Vitamin B-12: 288 pg/mL (ref 180–914)

## 2021-04-11 LAB — BRAIN NATRIURETIC PEPTIDE: B Natriuretic Peptide: 3284.8 pg/mL — ABNORMAL HIGH (ref 0.0–100.0)

## 2021-04-11 MED ORDER — ONDANSETRON HCL 4 MG PO TABS: 4.0000 mg | ORAL_TABLET | Freq: Four times a day (QID) | ORAL | Status: DC | PRN

## 2021-04-11 MED ORDER — ONDANSETRON HCL 4 MG/2ML IJ SOLN
4.0000 mg | Freq: Four times a day (QID) | INTRAMUSCULAR | Status: DC | PRN
Start: 2021-04-11 — End: 2021-04-16

## 2021-04-11 MED ORDER — PRAVASTATIN SODIUM 20 MG PO TABS
40.0000 mg | ORAL_TABLET | Freq: Every day | ORAL | Status: DC
  Administered 2021-04-11 – 2021-04-15 (×4): 40 mg via ORAL
  Filled 2021-04-11 (×4): qty 2

## 2021-04-11 MED ORDER — PANTOPRAZOLE SODIUM 40 MG PO TBEC
80.0000 mg | DELAYED_RELEASE_TABLET | Freq: Every day | ORAL | Status: DC
  Administered 2021-04-11 – 2021-04-12 (×2): 80 mg via ORAL
  Filled 2021-04-11 (×4): qty 2

## 2021-04-11 MED ORDER — LEVETIRACETAM 500 MG PO TABS
500.0000 mg | ORAL_TABLET | Freq: Two times a day (BID) | ORAL | Status: DC
  Administered 2021-04-11 – 2021-04-12 (×3): 500 mg via ORAL
  Filled 2021-04-11 (×5): qty 1

## 2021-04-11 MED ORDER — FUROSEMIDE 10 MG/ML IJ SOLN
40.0000 mg | Freq: Once | INTRAMUSCULAR | Status: AC
  Administered 2021-04-11: 40 mg via INTRAVENOUS
  Filled 2021-04-11: qty 4

## 2021-04-11 MED ORDER — HYDRALAZINE HCL 10 MG PO TABS
10.0000 mg | ORAL_TABLET | Freq: Four times a day (QID) | ORAL | Status: AC | PRN
  Filled 2021-04-11: qty 1

## 2021-04-11 MED ORDER — ACETAMINOPHEN 650 MG RE SUPP: 650.0000 mg | Freq: Four times a day (QID) | RECTAL | Status: AC | PRN

## 2021-04-11 MED ORDER — ALBUTEROL SULFATE HFA 108 (90 BASE) MCG/ACT IN AERS: 2.0000 | INHALATION_SPRAY | Freq: Four times a day (QID) | RESPIRATORY_TRACT | Status: DC | PRN

## 2021-04-11 MED ORDER — CARVEDILOL 6.25 MG PO TABS
6.2500 mg | ORAL_TABLET | Freq: Two times a day (BID) | ORAL | Status: DC
  Administered 2021-04-11: 6.25 mg via ORAL
  Filled 2021-04-11: qty 1

## 2021-04-11 MED ORDER — LISINOPRIL 10 MG PO TABS
10.0000 mg | ORAL_TABLET | Freq: Every day | ORAL | Status: DC
  Administered 2021-04-11: 10 mg via ORAL
  Filled 2021-04-11: qty 1

## 2021-04-11 MED ORDER — ALPRAZOLAM 0.5 MG PO TABS
1.0000 mg | ORAL_TABLET | Freq: Every day | ORAL | Status: DC | PRN
  Administered 2021-04-12: 1 mg via ORAL
  Filled 2021-04-11: qty 2

## 2021-04-11 MED ORDER — INSULIN ASPART 100 UNIT/ML IJ SOLN
0.0000 [IU] | Freq: Every day | INTRAMUSCULAR | Status: DC
  Administered 2021-04-13: 2 [IU] via SUBCUTANEOUS
  Filled 2021-04-11: qty 1

## 2021-04-11 MED ORDER — ACETAMINOPHEN 325 MG PO TABS
650.0000 mg | ORAL_TABLET | Freq: Four times a day (QID) | ORAL | Status: AC | PRN
  Administered 2021-04-13: 650 mg via ORAL
  Filled 2021-04-11 (×2): qty 2

## 2021-04-11 MED ORDER — ALBUTEROL SULFATE (2.5 MG/3ML) 0.083% IN NEBU: 2.5000 mg | INHALATION_SOLUTION | Freq: Four times a day (QID) | RESPIRATORY_TRACT | Status: DC | PRN

## 2021-04-11 MED ORDER — INSULIN GLARGINE-YFGN 100 UNIT/ML ~~LOC~~ SOLN
22.0000 [IU] | Freq: Every day | SUBCUTANEOUS | Status: DC
  Filled 2021-04-11 (×2): qty 0.22

## 2021-04-11 MED ORDER — INSULIN ASPART 100 UNIT/ML IJ SOLN
0.0000 [IU] | Freq: Three times a day (TID) | INTRAMUSCULAR | Status: DC
  Administered 2021-04-14: 2 [IU] via SUBCUTANEOUS
  Administered 2021-04-14: 3 [IU] via SUBCUTANEOUS
  Administered 2021-04-14: 5 [IU] via SUBCUTANEOUS
  Administered 2021-04-15: 3 [IU] via SUBCUTANEOUS
  Administered 2021-04-15: 2 [IU] via SUBCUTANEOUS
  Administered 2021-04-15: 5 [IU] via SUBCUTANEOUS
  Administered 2021-04-16: 3 [IU] via SUBCUTANEOUS
  Administered 2021-04-16: 5 [IU] via SUBCUTANEOUS
  Filled 2021-04-11 (×7): qty 1

## 2021-04-11 MED ORDER — APIXABAN 5 MG PO TABS: 5.0000 mg | ORAL_TABLET | Freq: Two times a day (BID) | ORAL | Status: DC

## 2021-04-11 NOTE — ED Notes (Signed)
Portable Xray at bedside.

## 2021-04-11 NOTE — ED Notes (Signed)
Pt transported back to room from CT at this time. 

## 2021-04-11 NOTE — Progress Notes (Signed)
Unable to place CPAP unit on patient at this time, because patient has restraint mittens in place. Unit remains at bedside.

## 2021-04-11 NOTE — ED Notes (Signed)
Pt cleaned up of urinary incontinence. Fresh linens placed on bed. Purewick placed on pt.

## 2021-04-11 NOTE — ED Notes (Addendum)
Secure chat message sent to new accepting nurse Durwin Glaze for transport to room 153 at this time. Vitals updated accordingly

## 2021-04-11 NOTE — ED Triage Notes (Signed)
Pt BIB [facility] from home C/O SOB and confusion.

## 2021-04-11 NOTE — ED Provider Notes (Signed)
[facility] Emergency Department Provider Note  ____________________________________________   Event Date/Time   First MD Initiated Contact with Patient 04/11/21 1218     (approximate)  I have reviewed the triage vital signs and the nursing notes.   HISTORY  Chief Complaint Altered Mental Status and Shortness of Breath    HPI Amber [name] is a 87 y.o. female with coronary disease, CHF, COPD who comes in with concerns for shortness of breath.  Call was for confusion and shortness of breath.  Patient is awake alert and oriented x1 but is unable to really tell me when the shortness of breath started.  She does deny any falls.  Patient initially 88% and given DuoNeb by EMS.  Unable to get full HPI due to patient's mental status.   Past Medical History:  Diagnosis Date   Anemia    Anxiety    Arthritis    Benign neoplasm of colon    CHF (congestive heart failure) ([place])    Chronic kidney disease    Chronic pain    Complication of anesthesia 1980's   hard time waking up   Coronary artery disease with unspecified angina pectoris    Cough    Diabetes mellitus without complication ([place])    Essential hypertension    GERD (gastroesophageal reflux disease)    High risk medication use    Hyperlipidemia    Plantar fascial fibromatosis    Presence of permanent cardiac pacemaker    Shortness of breath dyspnea     Patient Active Problem List   Diagnosis Date Noted   History of atrial fibrillation 02/25/2020   Pacemaker 02/25/2020   Acute on chronic diastolic CHF (congestive heart failure) ([place]) 02/25/2020   Complete heart block ([place]) 01/24/2020   Injury    S/P ORIF (open reduction internal fixation) fracture    Hypotension due to hypovolemia    Closed displaced fracture of right tibial tuberosity    Pain    Syncope and collapse 12/06/2019   Status post total hip replacement, left 02/09/2018   Primary osteoarthritis of left hip 01/06/2018    Chronic pain syndrome 04/09/2017   Other specified health status 04/08/2017   Disorder of bone, unspecified 04/08/2017   Long term current use of opiate analgesic 04/08/2017   Chronic low back pain (Primary Area of Pain) (Bilateral) (L>R) 04/08/2017   Chronic pain of both lower extremities  (Tertiary Area of Pain) 04/08/2017   Chronic groin pain (Secondary Area of Pain) 04/08/2017   Chronic neck pain (Fourth Area of Pain) (Bilateral) (L>R) 04/08/2017   Chronic sacroiliac joint pain 04/08/2017   Other long term (current) drug therapy 04/08/2017   Syncope 02/09/2017   Asthmatic bronchitis 01/26/2017   Fall 10/06/2016   Anxiety 08/08/2016   Acute respiratory failure with hypoxia ([place]) 07/26/2016   Acute on chronic systolic CHF (congestive heart failure) ([place]) 07/26/2016   TIA (transient ischemic attack) 07/25/2015   Sick sinus syndrome ([place]) 03/14/2015   CAD (coronary artery disease) 02/23/2015   Chronic heart failure ([place]) 01/05/2015   Fracture dislocation of ankle 07/27/2014   IDDM (insulin dependent diabetes mellitus) 07/27/2014   Essential hypertension 07/27/2014   Multiple rib fractures 07/27/2014   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   Anemia 12/06/2013   Type 2 diabetes mellitus with diabetic polyneuropathy, with long-term current use of insulin ([place]) 12/06/2013    Past Surgical History:  Procedure Laterality Date   ABDOMINAL HYSTERECTOMY     BACK SURGERY  1960's  cage and screws in lower back   CATARACT EXTRACTION W/ INTRAOCULAR LENS  IMPLANT, BILATERAL Bilateral    CORONARY ARTERY BYPASS GRAFT  2008   triple   HARDWARE REMOVAL Right 12/08/2019   Procedure: HARDWARE REMOVAL;  Surgeon: Hessie Knows, MD;  Location: [facility] OR[facility];  Service: Orthopedics;  Laterality: Right;   JOINT REPLACEMENT Right    hip and knee   ORIF ANKLE FRACTURE Right 07/27/2014   Procedure: OPEN REDUCTION INTERNAL FIXATION (ORIF) ANKLE FRACTURE;  Surgeon: Alta Corning, MD;  Location: [facility];  Service:  Orthopedics;  Laterality: Right;   ORIF TIBIA FRACTURE Right 12/08/2019   Procedure: OPEN REDUCTION INTERNAL FIXATION (ORIF) TIBIA FRACTURE;  Surgeon: Hessie Knows, MD;  Location: [facility];  Service: Orthopedics;  Laterality: Right;   PACEMAKER INSERTION Left 03/14/2015   Procedure: INSERTION PACEMAKER;  Surgeon: Isaias Cowman, MD;  Location: [facility];  Service: Cardiovascular;  Laterality: Left;   PACEMAKER LEADLESS INSERTION N/A 01/24/2020   Procedure: PACEMAKER LEADLESS INSERTION;  Surgeon: Isaias Cowman, MD;  Location: [facility];  Service: Cardiovascular;  Laterality: N/A;   ROTATOR CUFF REPAIR Left    TOTAL HIP ARTHROPLASTY Right    TOTAL HIP ARTHROPLASTY Left 02/09/2018   Procedure: TOTAL HIP ARTHROPLASTY ANTERIOR APPROACH;  Surgeon: Hessie Knows, MD;  Location: [facility];  Service: Orthopedics;  Laterality: Left;   TOTAL KNEE ARTHROPLASTY Right    VEIN LIGATION AND STRIPPING      Prior to Admission medications   Medication Sig Start Date End Date Taking? Authorizing Provider  acetaminophen (TYLENOL) 500 MG tablet Take 1,000 mg by mouth every 6 (six) hours as needed for mild pain or headache.     [provider]  albuterol (PROVENTIL HFA;VENTOLIN HFA) 108 (90 Base) MCG/ACT inhaler Inhale 2 puffs into the lungs every 6 (six) hours as needed for wheezing or shortness of breath. 01/28/17   Loletha Grayer, MD  ALPRAZolam Duanne Moron) 1 MG tablet Take 1 tablet (1 mg total) by mouth 2 (two) times daily as needed for anxiety or sleep. Patient taking differently: Take 1 mg by mouth daily as needed for anxiety or sleep. 12/12/19   Fritzi Mandes, MD  apixaban (ELIQUIS) 5 MG TABS tablet Take 5 mg by mouth 2 (two) times daily.    [provider]  calcium-vitamin D (OSCAL WITH D) 500-200 MG-UNIT tablet Take 1 tablet by mouth daily.    [provider]  carvedilol (COREG) 6.25 MG tablet Take 1 tablet (6.25 mg total) by mouth 2 (two) times daily with a meal. 01/28/17    Loletha Grayer, MD  furosemide (LASIX) 20 MG tablet Take 1 tablet (20 mg total) by mouth 2 (two) times daily. Patient taking differently: Take 20 mg by mouth daily. 02/27/20   Para Skeans, MD  gabapentin (NEURONTIN) 100 MG capsule Take 200 mg by mouth 3 (three) times daily.     [provider]  insulin glargine (LANTUS) 100 UNIT/ML injection Inject 22 Units into the skin daily.    [provider]  levETIRAcetam (KEPPRA) 500 MG tablet Take 500 mg by mouth 2 (two) times daily.    [provider]  lisinopril (ZESTRIL) 20 MG tablet Take 0.5 tablets (10 mg total) by mouth daily. 01/28/21   Alisa Graff, FNP  Multiple Vitamins-Minerals (PRESERVISION AREDS 2) CAPS Take 1 capsule by mouth daily.    [provider]  omeprazole (PRILOSEC) 40 MG capsule Take 40 mg by mouth daily.     [provider]  pravastatin (PRAVACHOL) 40 MG tablet Take 40 mg by mouth at bedtime.    [provider]    Allergies Ambien [zolpidem tartrate], Penicillins, Iodine, Succinylcholine, Etodolac, and Nsaids  Family History  Problem Relation Age of Onset   Heart attack Mother    Heart disease Father    Alzheimer's disease Sister    Cervical cancer Sister    Heart failure Son     Social History Social History   Tobacco Use   Smoking status: Never   Smokeless tobacco: Never  Vaping Use   Vaping Use: Never used  Substance Use Topics   Alcohol use: No   Drug use: No      Review of Systems review of systems is somewhat limited due to patient's mental status. Constitutional: No fever/chills Eyes: No visual changes. ENT: No sore throat. Cardiovascular: No chest pain Respiratory: Positive for SOB Gastrointestinal: No abdominal pain.  No nausea, no vomiting.  No diarrhea.  No constipation. Genitourinary: Negative for dysuria. Musculoskeletal: Negative for back pain. Skin: Negative for rash. Neurological: Negative for headaches, focal weakness or  numbness.  Positive confusion All other ROS negative ____________________________________________   PHYSICAL EXAM:  VITAL SIGNS: ED Triage Vitals  Enc Vitals Group     BP 04/11/21 1214 (!) 175/109     Pulse Rate 04/11/21 1214 75     Resp 04/11/21 1214 (!) 22     Temp 04/11/21 1214 97.9 F (36.6 C)     Temp Source 04/11/21 1214 Oral     SpO2 04/11/21 1211 100 %     Weight --      Height --      Head Circumference --      Peak Flow --      Pain Score 04/11/21 1215 0     Pain Loc --      Pain Edu? --      Excl. in [place]? --     Constitutional: Alert and oriented x1.  Elderly female Eyes: Conjunctivae are normal. EOMI. Head: Atraumatic. Nose: No congestion/rhinnorhea. Mouth/Throat: Mucous membranes are moist.   Neck: No stridor. Trachea Midline. FROM Cardiovascular: Normal rate, regular rhythm. Grossly normal heart sounds.  Good peripheral circulation. Respiratory: Increased work of breathing, clear lungs Gastrointestinal: Soft and nontender. No distention. No abdominal bruits.  Musculoskeletal: No lower extremity tenderness nor edema.  No joint effusions. Neurologic:  Normal speech and language. No gross focal neurologic deficits are appreciated.  Skin:  Skin is warm, dry and intact. No rash noted. Psychiatric: Mood and affect are normal. Speech and behavior are normal. GU: Deferred   ____________________________________________   LABS (all labs ordered are listed, but only abnormal results are displayed)  Labs Reviewed  CBC WITH DIFFERENTIAL/PLATELET - Abnormal; Notable for the following components:      Result Value   Hemoglobin 11.7 (*)    HCT 35.5 (*)    RDW 17.3 (*)    All other components within normal limits  BASIC METABOLIC PANEL - Abnormal; Notable for the following components:   Sodium 133 (*)    Chloride 92 (*)    Anion gap 16 (*)    All other components within normal limits  HEPATIC FUNCTION PANEL - Abnormal; Notable for the following components:    AST 45 (*)    Total Bilirubin 1.4 (*)    Bilirubin, Direct 0.3 (*)    Indirect Bilirubin 1.1 (*)    All other components within normal limits  BLOOD GAS, VENOUS -  Abnormal; Notable for the following components:   pH, Ven 7.45 (*)    pCO2, Ven 42 (*)    pO2, Ven <31.0 (*)    Bicarbonate 29.2 (*)    Acid-Base Excess 4.7 (*)    All other components within normal limits  BRAIN NATRIURETIC PEPTIDE - Abnormal; Notable for the following components:   B Natriuretic Peptide 3,284.8 (*)    All other components within normal limits  T4, FREE - Abnormal; Notable for the following components:   Free T4 1.38 (*)    All other components within normal limits  URINALYSIS, ROUTINE W REFLEX MICROSCOPIC - Abnormal; Notable for the following components:   Color, Urine STRAW (*)    APPearance CLEAR (*)    Hgb urine dipstick SMALL (*)    All other components within normal limits  TROPONIN I (HIGH SENSITIVITY) - Abnormal; Notable for the following components:   Troponin I (High Sensitivity) 82 (*)    All other components within normal limits  RESP PANEL BY RT-PCR (FLU A&B, COVID) ARPGX2  CULTURE, BLOOD (ROUTINE X 2)  CULTURE, BLOOD (ROUTINE X 2)  AMMONIA  TSH  LACTIC ACID, PLASMA  URINE DRUG SCREEN, QUALITATIVE ([facility] ONLY)  LACTIC ACID, PLASMA  TROPONIN I (HIGH SENSITIVITY)   ____________________________________________   ED ECG REPORT I, Vanessa [place], the attending physician, personally viewed and interpreted this ECG.  Atrial fibrillation rate of 77, no ST elevation, T wave version V2 V3 aVL, normal intervals ____________________________________________  RADIOLOGY Robert Bellow, personally viewed and evaluated these images (plain radiographs) as part of my medical decision making, as well as reviewing the written report by the radiologist.  ED MD interpretation: No pneumonia  Official radiology report(s): DG Chest Portable 1 View  Result Date: 04/11/2021 CLINICAL DATA:  Shortness of  breath. EXAM: PORTABLE CHEST 1 VIEW COMPARISON:  February 24, 2020. FINDINGS: Stable cardiomegaly. Left-sided pacemaker is unchanged in position. No pneumothorax or pleural effusion is noted. Mild bibasilar subsegmental atelectasis is noted. Bony thorax is unremarkable. IMPRESSION: Mild bibasilar subsegmental atelectasis. Electronically Signed   By: Marijo Conception M.D.   On: 04/11/2021 13:10    ____________________________________________   PROCEDURES  Procedure(s) performed (including Critical Care):  .1-3 Lead EKG Interpretation Performed by: Vanessa [place], MD Authorized by: Vanessa [place], MD     Interpretation: normal     ECG rate:  70s   ECG rate assessment: normal     Rhythm: sinus rhythm     Ectopy: none     Conduction: normal   .Critical Care Performed by: Vanessa [place], MD Authorized by: Vanessa [place], MD   Critical care provider statement:    Critical care time (minutes):  35   Critical care was necessary to treat or prevent imminent or life-threatening deterioration of the following conditions:  Respiratory failure   Critical care was time spent personally by me on the following activities:  Discussions with consultants, evaluation of patient's response to treatment, examination of patient, ordering and performing treatments and interventions, ordering and review of laboratory studies, ordering and review of radiographic studies, pulse oximetry, re-evaluation of patient's condition, obtaining history from patient or surrogate and review of old charts   ____________________________________________   INITIAL IMPRESSION / [place] / ED COURSE   Amber [name] was evaluated in Emergency Department on 04/11/2021 for the symptoms described in the history of present illness. She was evaluated in the context of the global COVID-19 pandemic, which  necessitated consideration that the patient might be at risk for infection with the SARS-CoV-2 virus that causes  COVID-19. Institutional protocols and algorithms that pertain to the evaluation of patients at risk for COVID-19 are in a state of rapid change based on information released by regulatory bodies including the CDC and federal and state organizations. These policies and algorithms were followed during the patient's care in the ED.     Difficult to create a good exam due to patient's altered mental status.  Patient is only oriented x1.  Unclear baseline but reportedly lives alone.  CT head ordered evaluate for any intracranial hemorrhage.  She is unable to follow commands but she does not have any gross deficits.  I also ordered initially a CT PE to rule out pulmonary embolism given her chest x-ray was negative for pneumonia as well as a CT abdomen to look for any kind of obstruction or anything that be causing her to be more altered given I cannot get a reliable abdominal exam.  Patient alerted [place] that she has an allergy to contrast.  Therefore we will start off with without contrast to see if we can find any explanation for her shortness of breath however she may need to be prepped for CT with contrast if the CT without is unrevealing.  VBG without evidence of hypercapnia.  No evidence of UTI.  Hemoglobin is stable.  Her sodium and chloride are slightly low but not hypotensive.  Troponin is elevated.  Has significant BNP elevation we will see there is any pulmonary edema on the CT scan.  Patient's oxygen level dipped down to 88% placed on 2 L for work of breathing and her oxygen levels.  I do not hear any wheezing on my examination but her BNP is significantly elevated so I will give her a dose of Lasix given she is hypertensive as well.  I do not hear any wheezing to suggest needing duo nebs but if CT is negative can consider to give her additional DuoNeb's, steroids.  Do not think this is PE given she is already on Eliquis.  Patient will be admitted to the hospital team for her new confusion/respiratory  issue    ____________________________________________   FINAL CLINICAL IMPRESSION(S) / ED DIAGNOSES   Final diagnoses:  SOB (shortness of breath)  Acute respiratory failure with hypoxia (HCC)  Confusion     MEDICATIONS GIVEN DURING THIS VISIT:  Medications  furosemide (LASIX) injection 40 mg (has no administration in time range)     ED Discharge Orders     None        Note:  This document was prepared using Dragon voice recognition software and may include unintentional dictation errors.   Vanessa Jasper, MD 04/11/21 1447

## 2021-04-11 NOTE — ED Notes (Signed)
Pt transported to CT via stretcher at this time.  

## 2021-04-11 NOTE — H&P (Signed)
History and Physical   Jennessa Trigo HEN:[id number] DOB: [birth year] DOA: 04/11/2021  PCP: Idelle Crouch, MD  Patient coming from: Home  I have personally briefly reviewed patient's old medical records in [place].  Chief Concern: Altered mental status  HPI: Amber [name] is a 87 y.o. female with medical history significant for CAD, COPD, OSA, history of insulin-dependent diabetes mellitus, hyperlipidemia, anxiety, seizure disorder, atrial fibrillation, TIA, who presents to the emergency department for chief concerns of altered mental status.  [name] at bedside states that she is healthcare power of attorney.    [name] reports that patient did not know where she was, looked at family and didn't know what to do. She did not have fever at home.  Patient has baseline that cough has been unchanged. DIL reports no known sick contacts.   Patient endorses frequent nausea and no vomiting. DIL reports no other complaints.  At bedside, patient intermittently follows commands.  She was not able to tell me her name, age, location, dil at bedside, the current calendar year.  She had difficulty speaking at times.  With the neurological questions, she will repeat them after me but was not able to answer any of my questions.  Review of system and or  questions for this H&P was not meaningful as patient was not able to participate fully.  Social history: Patient lives at home with his son and daughter-in-law.  Patient does not have history of tobacco, EtOH, recreational drug use.  Patient formally worked as a Set designer.  Vaccination history: Pfizer, 2 doses. no booster  ROS: Unable to complete as patient is actively confused  ED Course: Discussed with the ED doctor, patient requiring hospitalization for chief concerns of altered mental status.  Vitals in the emergency department was remarkable for temperature 97.9, respiration rate of 22, heart  rate 75, blood pressure 175/109, SPO2 of 92% on room air.  Labs in the emergency department was remarkable for 133, potassium 3.7, chloride 92, bicarb 25, BUN 18, serum creatinine of 0.72, nonfasting blood glucose 75, WBC 9.5, hemoglobin 11.7, platelets 321.  BNP elevated at 2284.8, high sensitive troponin 81.  UA was negative for nitrates and leukocytes.  COVID/influenza A/influenza B were negative.  Assessment/Plan  Principal Problem:   Acute on chronic respiratory failure with hypoxia (HCC) Active Problems:   Essential hypertension   Chronic heart failure (HCC)   TIA (transient ischemic attack)   Acute on chronic systolic CHF (congestive heart failure) (HCC)   Anxiety   Chronic pain syndrome   Complete heart block (HCC)   CAD (coronary artery disease)   Type 2 diabetes mellitus with diabetic polyneuropathy, with long-term current use of insulin (HCC)   History of atrial fibrillation   Pacemaker   Shortness of breath   Altered mental status   # Altered mental status-etiology work-up in progress, differentials include secondary to acute respiratory failure with hypoxia, pneumonia, stroke # Encephalopathy - Etiology work-up in progress - Differential diagnosis includes pneumonia, heart failure exacerbation, stroke, TIA - Check blood cultures x2, B12, procalcitonin - Complete echo ordered - Neurology, Dr. Rory Percy has been consulted and states he will see the patient and at this time recommends EEG, bilateral carotid ultrasound, and scheduled CT of the head without contrast for 04/12/2021 - EDP ordered Lasix 40 mg IV once - Bilateral US of carotids have been ordered, CTA preferred however patient has an iodine allergy - Repeat CT head w/o contrast scheduled  for 24 hours later - MRI of the brain has been ordered however can not be done due to patient having a pacemaker - Strict I's and O's - SLP ordered - Permissive hypertension - Hydralazine 10 mg p.o. every 6 hours.  For SBP  greater than 200, 1 day ordered  # Insulin-dependent diabetes mellitus - Resumed home glargine 22 units - Insulin SSI with at bedtime coverage ordered - Blood glucose goal while inpatient is 140-180  # Hyperlipidemia-pravastatin 40 mg nightly ordered  # History of seizures-resumed home Keppra 500 mg p.o. twice daily, seizure precaution  # GERD-PPI  # Atrial fibrillation-Eliquis has been held at this time pending repeat CT of the head on 04/12/2021  # Anxiety-resumed home alprazolam 1 mg daily as needed for anxiety, sleep  # OSA-CPAP nightly ordered  Chart reviewed.   DVT prophylaxis:  Code Status: DNR/DNI Diet: N.p.o. Family Communication: Updated daughter-in-law, Shelda Altes at bedside Disposition Plan: Pending clinical course Consults called: Neurology Admission status: MedSurg, observation, telemetry  Past Medical History:  Diagnosis Date   Anemia    Anxiety    Arthritis    Benign neoplasm of colon    CHF (congestive heart failure) ([place])    Chronic kidney disease    Chronic pain    Complication of anesthesia 1980's   hard time waking up   Coronary artery disease with unspecified angina pectoris    Cough    Diabetes mellitus without complication ([place])    Essential hypertension    GERD (gastroesophageal reflux disease)    High risk medication use    Hyperlipidemia    Plantar fascial fibromatosis    Presence of permanent cardiac pacemaker    Shortness of breath dyspnea    Past Surgical History:  Procedure Laterality Date   ABDOMINAL HYSTERECTOMY     BACK SURGERY  1960's   cage and screws in lower back   CATARACT EXTRACTION W/ INTRAOCULAR LENS  IMPLANT, BILATERAL Bilateral    CORONARY ARTERY BYPASS GRAFT  2008   triple   HARDWARE REMOVAL Right 12/08/2019   Procedure: HARDWARE REMOVAL;  Surgeon: Hessie Knows, MD;  Location: [facility];  Service: Orthopedics;  Laterality: Right;   JOINT REPLACEMENT Right    hip and knee   ORIF ANKLE FRACTURE Right 07/27/2014    Procedure: OPEN REDUCTION INTERNAL FIXATION (ORIF) ANKLE FRACTURE;  Surgeon: Alta Corning, MD;  Location: [facility];  Service: Orthopedics;  Laterality: Right;   ORIF TIBIA FRACTURE Right 12/08/2019   Procedure: OPEN REDUCTION INTERNAL FIXATION (ORIF) TIBIA FRACTURE;  Surgeon: Hessie Knows, MD;  Location: [facility];  Service: Orthopedics;  Laterality: Right;   PACEMAKER INSERTION Left 03/14/2015   Procedure: INSERTION PACEMAKER;  Surgeon: Isaias Cowman, MD;  Location: [facility];  Service: Cardiovascular;  Laterality: Left;   PACEMAKER LEADLESS INSERTION N/A 01/24/2020   Procedure: PACEMAKER LEADLESS INSERTION;  Surgeon: Isaias Cowman, MD;  Location: [facility];  Service: Cardiovascular;  Laterality: N/A;   ROTATOR CUFF REPAIR Left    TOTAL HIP ARTHROPLASTY Right    TOTAL HIP ARTHROPLASTY Left 02/09/2018   Procedure: TOTAL HIP ARTHROPLASTY ANTERIOR APPROACH;  Surgeon: Hessie Knows, MD;  Location: [facility];  Service: Orthopedics;  Laterality: Left;   TOTAL KNEE ARTHROPLASTY Right    VEIN LIGATION AND STRIPPING     Social History:  reports that she has never smoked. She has never used smokeless tobacco. She reports that she does not drink alcohol and does not use drugs.  Allergies  Allergen  Reactions   Ambien [Zolpidem Tartrate] Other (See Comments)    Reaction:  Keeps pt awake    Penicillins Itching and Other (See Comments)    Has patient had a PCN reaction causing immediate rash, facial/tongue/throat swelling, SOB or lightheadedness with hypotension: No Has patient had a PCN reaction causing severe rash involving mucus membranes or skin necrosis: No Has patient had a PCN reaction that required hospitalization No Has patient had a PCN reaction occurring within the last 10 years: No If all of the above answers are "NO", then may proceed with Cephalosporin use.   Iodine Itching   Succinylcholine Other (See Comments)    Reaction:  Unknown    Etodolac Other (See Comments)    GI  upset   Nsaids Other (See Comments)    Reaction: gi upset   Family History  Problem Relation Age of Onset   Heart attack Mother    Heart disease Father    Alzheimer's disease Sister    Cervical cancer Sister    Heart failure Son    Family history: Family history reviewed and pertinent for heart disease in father and heart failure in son.  Prior to Admission medications   Medication Sig Start Date End Date Taking? Authorizing Provider  acetaminophen (TYLENOL) 500 MG tablet Take 1,000 mg by mouth every 6 (six) hours as needed for mild pain or headache.     [provider]  albuterol (PROVENTIL HFA;VENTOLIN HFA) 108 (90 Base) MCG/ACT inhaler Inhale 2 puffs into the lungs every 6 (six) hours as needed for wheezing or shortness of breath. 01/28/17   Loletha Grayer, MD  ALPRAZolam Duanne Moron) 1 MG tablet Take 1 tablet (1 mg total) by mouth 2 (two) times daily as needed for anxiety or sleep. Patient taking differently: Take 1 mg by mouth daily as needed for anxiety or sleep. 12/12/19   Fritzi Mandes, MD  apixaban (ELIQUIS) 5 MG TABS tablet Take 5 mg by mouth 2 (two) times daily.    [provider]  calcium-vitamin D (OSCAL WITH D) 500-200 MG-UNIT tablet Take 1 tablet by mouth daily.    [provider]  carvedilol (COREG) 6.25 MG tablet Take 1 tablet (6.25 mg total) by mouth 2 (two) times daily with a meal. 01/28/17   Loletha Grayer, MD  furosemide (LASIX) 20 MG tablet Take 1 tablet (20 mg total) by mouth 2 (two) times daily. Patient taking differently: Take 20 mg by mouth daily. 02/27/20   Para Skeans, MD  gabapentin (NEURONTIN) 100 MG capsule Take 200 mg by mouth 3 (three) times daily.     [provider]  insulin glargine (LANTUS) 100 UNIT/ML injection Inject 22 Units into the skin daily.    [provider]  levETIRAcetam (KEPPRA) 500 MG tablet Take 500 mg by mouth 2 (two) times daily.    [provider]  lisinopril (ZESTRIL) 20 MG tablet Take  0.5 tablets (10 mg total) by mouth daily. 01/28/21   Alisa Graff, FNP  Multiple Vitamins-Minerals (PRESERVISION AREDS 2) CAPS Take 1 capsule by mouth daily.    [provider]  omeprazole (PRILOSEC) 40 MG capsule Take 40 mg by mouth daily.     [provider]  pravastatin (PRAVACHOL) 40 MG tablet Take 40 mg by mouth at bedtime.    [provider]   Physical Exam: Vitals:   04/11/21 1211 04/11/21 1214  BP:  (!) 175/109  Pulse:  75  Resp:  (!) 22  Temp:  97.9 F (  36.6 C)  TempSrc:  Oral  SpO2: 100% 92%   Constitutional: appears age-appropriate, frail, NAD, calm, comfortable Eyes: PERRL, lids and conjunctivae normal ENMT: Mucous membranes are moist. Posterior pharynx clear of any exudate or lesions. Age-appropriate dentition. Hearing appropriate Neck: normal, supple, no masses, no thyromegaly Respiratory: clear to auscultation bilaterally, no wheezing, no crackles. Normal respiratory effort. No accessory muscle use.  Cardiovascular: Regular rate and rhythm, no murmurs / rubs / gallops. No extremity edema. 2+ pedal pulses. No carotid bruits.  Abdomen: no tenderness, no masses palpated, no hepatosplenomegaly. Bowel sounds positive.  Musculoskeletal: no clubbing / cyanosis. No joint deformity upper and lower extremities. Good ROM, no contractures, no atrophy. Normal muscle tone.  Skin: no rashes, lesions, ulcers. No induration Neurologic: Sensation intact. Strength 5/5 in all 4.  Psychiatric: Patient is not awake alert and oriented to self, age, location, family member at bedside.  Patient intermittently follows command.  Normal mood.   EKG: ordered  Chest x-ray on Admission: I personally reviewed and I agree with radiologist reading as below.  CT HEAD WO CONTRAST (5MM)  Result Date: 04/11/2021 CLINICAL DATA:  Mental status changes of unknown cause. Confusion and shortness of breath. EXAM: CT HEAD WITHOUT CONTRAST TECHNIQUE: Contiguous axial images were  obtained from the base of the skull through the vertex without intravenous contrast. COMPARISON:  01/18/2017 FINDINGS: Brain: Age related volume loss. Extensive chronic small-vessel ischemic changes of the cerebral hemispheric white matter. No sign of acute infarction, mass lesion, hemorrhage, hydrocephalus or extra-axial collection. Vascular: There is atherosclerotic calcification of the major vessels at the base of the brain. Skull: Negative Sinuses/Orbits: Clear/normal Other: None IMPRESSION: No acute finding by CT. Age related volume loss. Chronic small-vessel ischemic changes of the white matter. Electronically Signed   By: Nelson Chimes M.D.   On: 04/11/2021 14:59   DG Chest Portable 1 View  Result Date: 04/11/2021 CLINICAL DATA:  Shortness of breath. EXAM: PORTABLE CHEST 1 VIEW COMPARISON:  February 24, 2020. FINDINGS: Stable cardiomegaly. Left-sided pacemaker is unchanged in position. No pneumothorax or pleural effusion is noted. Mild bibasilar subsegmental atelectasis is noted. Bony thorax is unremarkable. IMPRESSION: Mild bibasilar subsegmental atelectasis. Electronically Signed   By: Marijo Conception M.D.   On: 04/11/2021 13:10   CT CHEST ABDOMEN PELVIS WO CONTRAST  Result Date: 04/11/2021 CLINICAL DATA:  Shortness of breath.  Confusion. EXAM: CT CHEST, ABDOMEN AND PELVIS WITHOUT CONTRAST TECHNIQUE: Multidetector CT imaging of the chest, abdomen and pelvis was performed following the standard protocol without IV contrast. COMPARISON:  Chest radiography same day.  CT abdomen 02/08/2018 FINDINGS: CT CHEST FINDINGS Cardiovascular: The heart is enlarged. Pacemaker leads in place. Previous median sternotomy and CABG. No pericardial effusion. Aortic atherosclerotic calcification is present. Mediastinum/Nodes: No evidence of mediastinal or hilar mass on this noncontrast exam. Moderate size hiatal hernia. Lungs/Pleura: Small bilateral pleural effusions, larger on the right than the left. Dependent atelectasis  associated with those effusions. No visible pneumonia. No mass or nodule. Musculoskeletal: Chronic degenerative changes throughout the thoracic spine. CT ABDOMEN PELVIS FINDINGS Hepatobiliary: Liver parenchyma appears normal without contrast. No calcified gallstones. Pancreas: Normal Spleen: Normal Adrenals/Urinary Tract: Adrenal glands are normal. Kidneys are normal. No mass, stone or hydronephrosis. Bladder is normal. Stomach/Bowel: Hiatal hernia as noted above. Small bowel appears normal. There is diverticulosis of the left colon but no evidence of diverticulitis. Vascular/Lymphatic: Aortic atherosclerosis. No aneurysm. IVC is normal. No retroperitoneal adenopathy. Reproductive: Previous hysterectomy.  No pelvic mass. Other: No free fluid or  air. Musculoskeletal: Previous lumbar fusion surgery at L2-3. Chronic degenerative changes above and below that. Bilateral hip replacements. IMPRESSION: CT chest: Bilateral pleural effusions layering dependently, larger on the right than the left, with dependent atelectasis. Cardiomegaly.  Previous CABG.  Pacemaker. Hiatal hernia. CT abdomen pelvis: No acute or significant finding. Aortic Atherosclerosis (ICD10-I70.0). Diverticulosis without evidence of diverticulitis. Degenerative changes throughout the spine without evidence of fracture or focal lesion. Electronically Signed   By: Nelson Chimes M.D.   On: 04/11/2021 15:03    Labs on Admission: I have personally reviewed following labs  CBC: Recent Labs  Lab 04/11/21 1230  WBC 9.5  NEUTROABS 7.5  HGB 11.7*  HCT 35.5*  MCV 82.8  PLT 696   Basic Metabolic Panel: Recent Labs  Lab 04/11/21 1230  NA 133*  K 3.7  CL 92*  CO2 25  GLUCOSE 75  BUN 18  CREATININE 0.72  CALCIUM 9.7   Liver Function Tests: Recent Labs  Lab 04/11/21 1230  AST 45*  ALT 21  ALKPHOS 75  BILITOT 1.4*  PROT 7.4  ALBUMIN 3.8   Recent Labs  Lab 04/11/21 1230  AMMONIA <10   Thyroid Function Tests: Recent Labs     04/11/21 1230  TSH 2.352  FREET4 1.38*   Urine analysis:    Component Value Date/Time   COLORURINE STRAW (A) 04/11/2021 1230   [place] (A) 04/11/2021 1230   APPEARANCEUR [place] 11/25/2013 2129   LABSPEC 1.010 04/11/2021 1230   LABSPEC 1.019 11/25/2013 2129   PHURINE 7.0 04/11/2021 1230   GLUCOSEU NEGATIVE 04/11/2021 1230   GLUCOSEU Negative 11/25/2013 2129   HGBUR SMALL (A) 04/11/2021 1230   BILIRUBINUR NEGATIVE 04/11/2021 1230   BILIRUBINUR Negative 11/25/2013 2129   KETONESUR NEGATIVE 04/11/2021 1230   PROTEINUR NEGATIVE 04/11/2021 1230   NITRITE NEGATIVE 04/11/2021 1230   LEUKOCYTESUR NEGATIVE 04/11/2021 1230   LEUKOCYTESUR Trace 11/25/2013 2129   Dr. Tobie Poet [facility]  If 7PM-7AM, please contact overnight-coverage provider If 7AM-7PM, please contact day coverage provider [email or web address]  04/11/2021, 3:08 PM

## 2021-04-12 ENCOUNTER — Observation Stay
Admit: 2021-04-12 | Discharge: 2021-04-12 | Disposition: A | Discharge status: Home / Self Care | Payer: Medicare HMO | Attending: Internal Medicine | Admitting: Internal Medicine

## 2021-04-12 ENCOUNTER — Encounter: Payer: Self-pay | Admitting: Internal Medicine

## 2021-04-12 ENCOUNTER — Observation Stay: Payer: Medicare HMO

## 2021-04-12 DIAGNOSIS — Z950 Presence of cardiac pacemaker: Secondary | ICD-10-CM | POA: Diagnosis not present

## 2021-04-12 DIAGNOSIS — E1142 Type 2 diabetes mellitus with diabetic polyneuropathy: Secondary | ICD-10-CM | POA: Diagnosis present

## 2021-04-12 DIAGNOSIS — J449 Chronic obstructive pulmonary disease, unspecified: Secondary | ICD-10-CM | POA: Diagnosis present

## 2021-04-12 DIAGNOSIS — Z66 Do not resuscitate: Secondary | ICD-10-CM | POA: Diagnosis present

## 2021-04-12 DIAGNOSIS — R9082 White matter disease, unspecified: Secondary | ICD-10-CM | POA: Diagnosis not present

## 2021-04-12 DIAGNOSIS — J9621 Acute and chronic respiratory failure with hypoxia: Secondary | ICD-10-CM | POA: Diagnosis not present

## 2021-04-12 DIAGNOSIS — R4182 Altered mental status, unspecified: Secondary | ICD-10-CM

## 2021-04-12 DIAGNOSIS — I10 Essential (primary) hypertension: Secondary | ICD-10-CM

## 2021-04-12 DIAGNOSIS — R0602 Shortness of breath: Secondary | ICD-10-CM | POA: Diagnosis present

## 2021-04-12 DIAGNOSIS — I5023 Acute on chronic systolic (congestive) heart failure: Secondary | ICD-10-CM

## 2021-04-12 DIAGNOSIS — I495 Sick sinus syndrome: Secondary | ICD-10-CM | POA: Diagnosis present

## 2021-04-12 DIAGNOSIS — G319 Degenerative disease of nervous system, unspecified: Secondary | ICD-10-CM | POA: Diagnosis not present

## 2021-04-12 DIAGNOSIS — M722 Plantar fascial fibromatosis: Secondary | ICD-10-CM | POA: Diagnosis present

## 2021-04-12 DIAGNOSIS — I5043 Acute on chronic combined systolic (congestive) and diastolic (congestive) heart failure: Secondary | ICD-10-CM | POA: Diagnosis present

## 2021-04-12 DIAGNOSIS — I2510 Atherosclerotic heart disease of native coronary artery without angina pectoris: Secondary | ICD-10-CM | POA: Diagnosis present

## 2021-04-12 DIAGNOSIS — I110 Hypertensive heart disease with heart failure: Secondary | ICD-10-CM | POA: Diagnosis present

## 2021-04-12 DIAGNOSIS — Z20822 Contact with and (suspected) exposure to covid-19: Secondary | ICD-10-CM | POA: Diagnosis present

## 2021-04-12 DIAGNOSIS — E1122 Type 2 diabetes mellitus with diabetic chronic kidney disease: Secondary | ICD-10-CM | POA: Diagnosis present

## 2021-04-12 DIAGNOSIS — G928 Other toxic encephalopathy: Secondary | ICD-10-CM | POA: Diagnosis present

## 2021-04-12 DIAGNOSIS — G4733 Obstructive sleep apnea (adult) (pediatric): Secondary | ICD-10-CM | POA: Diagnosis present

## 2021-04-12 DIAGNOSIS — I6523 Occlusion and stenosis of bilateral carotid arteries: Secondary | ICD-10-CM | POA: Diagnosis not present

## 2021-04-12 DIAGNOSIS — I442 Atrioventricular block, complete: Secondary | ICD-10-CM | POA: Diagnosis present

## 2021-04-12 DIAGNOSIS — Z961 Presence of intraocular lens: Secondary | ICD-10-CM | POA: Diagnosis present

## 2021-04-12 DIAGNOSIS — R29818 Other symptoms and signs involving the nervous system: Secondary | ICD-10-CM | POA: Diagnosis not present

## 2021-04-12 DIAGNOSIS — G894 Chronic pain syndrome: Secondary | ICD-10-CM | POA: Diagnosis present

## 2021-04-12 DIAGNOSIS — F419 Anxiety disorder, unspecified: Secondary | ICD-10-CM | POA: Diagnosis present

## 2021-04-12 DIAGNOSIS — Z96643 Presence of artificial hip joint, bilateral: Secondary | ICD-10-CM | POA: Diagnosis present

## 2021-04-12 DIAGNOSIS — I4891 Unspecified atrial fibrillation: Secondary | ICD-10-CM | POA: Diagnosis present

## 2021-04-12 DIAGNOSIS — E1165 Type 2 diabetes mellitus with hyperglycemia: Secondary | ICD-10-CM | POA: Diagnosis present

## 2021-04-12 DIAGNOSIS — K219 Gastro-esophageal reflux disease without esophagitis: Secondary | ICD-10-CM | POA: Diagnosis present

## 2021-04-12 DIAGNOSIS — Z8673 Personal history of transient ischemic attack (TIA), and cerebral infarction without residual deficits: Secondary | ICD-10-CM | POA: Diagnosis not present

## 2021-04-12 DIAGNOSIS — G40909 Epilepsy, unspecified, not intractable, without status epilepticus: Secondary | ICD-10-CM | POA: Diagnosis present

## 2021-04-12 DIAGNOSIS — E119 Type 2 diabetes mellitus without complications: Secondary | ICD-10-CM | POA: Diagnosis not present

## 2021-04-12 DIAGNOSIS — E785 Hyperlipidemia, unspecified: Secondary | ICD-10-CM | POA: Diagnosis present

## 2021-04-12 LAB — BLOOD CULTURE ID PANEL (REFLEXED) - BCID2

## 2021-04-12 LAB — HEMOGLOBIN A1C
Hgb A1c MFr Bld: 7.1 % — ABNORMAL HIGH (ref 4.8–5.6)
Mean Plasma Glucose: 157 mg/dL

## 2021-04-12 LAB — BASIC METABOLIC PANEL
Anion gap: 13 (ref 5–15)
BUN: 24 mg/dL — ABNORMAL HIGH (ref 8–23)
CO2: 27 mmol/L (ref 22–32)
Calcium: 9.2 mg/dL (ref 8.9–10.3)
Chloride: 92 mmol/L — ABNORMAL LOW (ref 98–111)
Creatinine, Ser: 0.95 mg/dL (ref 0.44–1.00)
GFR, Estimated: 58 mL/min — ABNORMAL LOW (ref >60)
Glucose, Bld: 62 mg/dL — ABNORMAL LOW (ref 70–99)
Potassium: 3.2 mmol/L — ABNORMAL LOW (ref 3.5–5.1)
Sodium: 132 mmol/L — ABNORMAL LOW (ref 135–145)

## 2021-04-12 LAB — ECHOCARDIOGRAM COMPLETE
AR max vel: 0.68 cm2
AV Area VTI: 0.62 cm2
AV Area mean vel: 0.74 cm2
AV Mean grad: 11 mmHg
AV Peak grad: 19.4 mmHg
Ao pk vel: 2.21 m/s
Area-P 1/2: 4.19 cm2
MV VTI: 0.92 cm2
S' Lateral: 4.4 cm
Weight: 2701.96 oz

## 2021-04-12 LAB — CBC
HCT: 32.9 % — ABNORMAL LOW (ref 36.0–46.0)
Hemoglobin: 10.8 g/dL — ABNORMAL LOW (ref 12.0–15.0)
MCH: 26.9 pg (ref 26.0–34.0)
MCHC: 32.8 g/dL (ref 30.0–36.0)
MCV: 82 fL (ref 80.0–100.0)
Platelets: 290 10*3/uL (ref 150–400)
RBC: 4.01 MIL/uL (ref 3.87–5.11)
RDW: 17.2 % — ABNORMAL HIGH (ref 11.5–15.5)
WBC: 10.7 10*3/uL — ABNORMAL HIGH (ref 4.0–10.5)
nRBC: 0 % (ref 0.0–0.2)

## 2021-04-12 LAB — GLUCOSE, CAPILLARY
Glucose-Capillary: 62 mg/dL — ABNORMAL LOW (ref 70–99)
Glucose-Capillary: 53 mg/dL — ABNORMAL LOW (ref 70–99)
Glucose-Capillary: 80 mg/dL (ref 70–99)
Glucose-Capillary: 74 mg/dL (ref 70–99)
Glucose-Capillary: 117 mg/dL — ABNORMAL HIGH (ref 70–99)
Glucose-Capillary: 87 mg/dL (ref 70–99)

## 2021-04-12 MED ORDER — VITAMIN B-12 1000 MCG PO TABS
1000.0000 ug | ORAL_TABLET | Freq: Every day | ORAL | Status: DC
  Administered 2021-04-14 – 2021-04-16 (×3): 1000 ug via ORAL
  Filled 2021-04-12 (×5): qty 1

## 2021-04-12 MED ORDER — DEXTROSE 50 % IV SOLN
50.0000 mL | Freq: Once | INTRAVENOUS | Status: AC
  Administered 2021-04-12: 50 mL via INTRAVENOUS
  Filled 2021-04-12: qty 50

## 2021-04-12 MED ORDER — CARVEDILOL 3.125 MG PO TABS
6.2500 mg | ORAL_TABLET | Freq: Two times a day (BID) | ORAL | Status: DC
  Administered 2021-04-14 – 2021-04-16 (×5): 6.25 mg via ORAL
  Filled 2021-04-12 (×7): qty 2

## 2021-04-12 MED ORDER — FUROSEMIDE 10 MG/ML IJ SOLN
40.0000 mg | Freq: Every day | INTRAMUSCULAR | Status: DC
  Administered 2021-04-12 – 2021-04-14 (×3): 40 mg via INTRAVENOUS
  Filled 2021-04-12 (×3): qty 4

## 2021-04-12 MED ORDER — VANCOMYCIN HCL 1500 MG/300ML IV SOLN
1500.0000 mg | Freq: Once | INTRAVENOUS | Status: AC
  Administered 2021-04-12: 1500 mg via INTRAVENOUS
  Filled 2021-04-12: qty 300

## 2021-04-12 MED ORDER — NEPRO/CARBSTEADY PO LIQD
237.0000 mL | Freq: Two times a day (BID) | ORAL | Status: DC
Start: 2021-04-13 — End: 2021-04-16
  Administered 2021-04-13 – 2021-04-14 (×3): 237 mL via ORAL

## 2021-04-12 MED ORDER — VANCOMYCIN HCL 1250 MG/250ML IV SOLN
1250.0000 mg | Freq: Once | INTRAVENOUS | Status: DC
  Filled 2021-04-12: qty 250

## 2021-04-12 MED ORDER — VANCOMYCIN HCL IN DEXTROSE 1-5 GM/200ML-% IV SOLN: 1000.0000 mg | INTRAVENOUS | Status: DC

## 2021-04-12 NOTE — Evaluation (Signed)
Clinical/Bedside Swallow Evaluation Patient Details  Name: Amber [name] MRN: [id number] Date of Birth: 1930/03/22  Today's Date: 04/12/2021 Time: SLP Start Time (ACUTE ONLY): 55 SLP Stop Time (ACUTE ONLY): 5093 SLP Time Calculation (min) (ACUTE ONLY): 60 min  Past Medical History:  Past Medical History:  Diagnosis Date   Anemia    Anxiety    Arthritis    Benign neoplasm of colon    CHF (congestive heart failure) (HCC)    Chronic kidney disease    Chronic pain    Complication of anesthesia 1980's   hard time waking up   Coronary artery disease with unspecified angina pectoris    Cough    Diabetes mellitus without complication ([place])    Essential hypertension    GERD (gastroesophageal reflux disease)    High risk medication use    Hyperlipidemia    Plantar fascial fibromatosis    Presence of permanent cardiac pacemaker    Shortness of breath dyspnea    Past Surgical History:  Past Surgical History:  Procedure Laterality Date   ABDOMINAL HYSTERECTOMY     BACK SURGERY  1960's   cage and screws in lower back   CATARACT EXTRACTION W/ INTRAOCULAR LENS  IMPLANT, BILATERAL Bilateral    CORONARY ARTERY BYPASS GRAFT  2008   triple   HARDWARE REMOVAL Right 12/08/2019   Procedure: HARDWARE REMOVAL;  Surgeon: Hessie Knows, MD;  Location: [facility];  Service: Orthopedics;  Laterality: Right;   JOINT REPLACEMENT Right    hip and knee   ORIF ANKLE FRACTURE Right 07/27/2014   Procedure: OPEN REDUCTION INTERNAL FIXATION (ORIF) ANKLE FRACTURE;  Surgeon: Alta Corning, MD;  Location: [facility];  Service: Orthopedics;  Laterality: Right;   ORIF TIBIA FRACTURE Right 12/08/2019   Procedure: OPEN REDUCTION INTERNAL FIXATION (ORIF) TIBIA FRACTURE;  Surgeon: Hessie Knows, MD;  Location: [facility];  Service: Orthopedics;  Laterality: Right;   PACEMAKER INSERTION Left 03/14/2015   Procedure: INSERTION PACEMAKER;  Surgeon: Isaias Cowman, MD;  Location: [facility];  Service: Cardiovascular;   Laterality: Left;   PACEMAKER LEADLESS INSERTION N/A 01/24/2020   Procedure: PACEMAKER LEADLESS INSERTION;  Surgeon: Isaias Cowman, MD;  Location: [facility];  Service: Cardiovascular;  Laterality: N/A;   ROTATOR CUFF REPAIR Left    TOTAL HIP ARTHROPLASTY Right    TOTAL HIP ARTHROPLASTY Left 02/09/2018   Procedure: TOTAL HIP ARTHROPLASTY ANTERIOR APPROACH;  Surgeon: Hessie Knows, MD;  Location: [facility];  Service: Orthopedics;  Laterality: Left;   TOTAL KNEE ARTHROPLASTY Right    VEIN LIGATION AND STRIPPING     HPI:  Pt is a 87 y.o. female past medical history of CHF, chronic back pain, coronary artery disease, diabetes, hypertension, hyperlipidemia, who comes from home for evaluation of altered mental status.  No family is present at bedside-history obtained from chart review.  ER notes-patient brought in for concerns for altered mental status and shortness of breath with saturations at 88% requiring duo nebs by EMS.  According to the chart review, daughter-in-law who is the [facility] POA states that patient started acting confused not knowing who the family members are when at baseline she is not confused.  No fevers or chills.  Has a baseline cough that has been unchanged.  No sick contacts.  Per Neurology note, likely toxic metabolic derangements leading to encephalopathy.  Head CT: No acute finding by CT. age related volume loss. Chronic  small-vessel ischemic changes of the white matter.  CT chest: bilateral pleural effusions  layering dependently, larger on the right than the left, with dependent atelectasis.   Hiatal hernia.    Assessment / Plan / Recommendation  Clinical Impression  Pt appears to present w/ oropharyngeal phase dysphagia in light of declined Cognitive status; decreased oral attention and oral prep. This can impact her overall awareness/timing of swallow and safety during po tasks which increases risk for aspiration, choking. Pt's risk for aspiration is present but can be  reduced when following general aspiration precautions and using a modified diet consistency w/ Nectar liquids. She required MOD verbal/ visual cues during po tasks.       Pt consumed trials of ice chips, purees, and Nectar liquids via TSP and Cup sips w/ No overt clinical s/s of aspiration noted; no decline in vocal quality; no cough, and no decline in respiratory status during/post trials. Oral phase was c/b decreased awareness during oral prep and slow A-P transfer; oral clearing of the boluses achieved given time. Pt attempted hold Cup for self-feeding but required Mod support and cues d/t eyes closing at times during tasks. OM Exam appeared grossly [facility] w/ No unilateral weakness noted. Some confusion of OM tasks and oral care.         D/t pt's presentation and risk for aspiration, recommend initiation of the dysphagia level 1(puree diet ) w/ Nectar liquids via Cup; general aspiration precautions; reduce Distractions during meals and engage pt during po's at meal for self-feeding. Pills Crushed in Puree for safer swallowing. Support w/ feeding at meals as needed. MD/NSG updated. ST services recommends follow up as pt's status improves in oder to safely upgrade diet to least restricitive consistency. Precautions posted. SLP Visit Diagnosis: Dysphagia, oropharyngeal phase (R13.12)    Aspiration Risk  Mild aspiration risk;Moderate aspiration risk;Risk for inadequate nutrition/hydration    Diet Recommendation   dysphagia level 1(puree diet ) w/ Nectar liquids via Cup; general aspiration precautions; reduce Distractions during meals and engage pt during po's at meal for self-feeding. Support w/ feeding at meals -- must be fully awake for oral intake.  Medication Administration: Crushed with puree    Other  Recommendations Recommended Consults:  (Dietician f/u) Oral Care Recommendations: Oral care BID;Oral care before and after PO;Staff/trained caregiver to provide oral care Other Recommendations: Order  thickener from pharmacy;Prohibited food (jello, ice cream, thin soups);Remove water pitcher;Have oral suction available    Recommendations for follow up therapy are one component of a multi-disciplinary discharge planning process, led by the attending physician.  Recommendations may be updated based on patient status, additional functional criteria and insurance authorization.  Follow up Recommendations  (TBD)      Frequency and Duration min 3x week  2 weeks       Prognosis Prognosis for Safe Diet Advancement: Fair (-Good) Barriers to Reach Goals: Severity of deficits;Behavior      Swallow Study   General Date of Onset: 04/11/21 HPI: Pt is a 87 y.o. female past medical history of CHF, chronic back pain, coronary artery disease, diabetes, hypertension, hyperlipidemia, who comes from home for evaluation of altered mental status.  No family is present at bedside-history obtained from chart review.  ER notes-patient brought in for concerns for altered mental status and shortness of breath with saturations at 88% requiring duo nebs by EMS.  According to the chart review, daughter-in-law who is the [employer] POA states that patient started acting confused not knowing who the family members are when at baseline she is not confused.  No fevers or chills.  Has  a baseline cough that has been unchanged.  No sick contacts.  Per Neurology note, likely toxic metabolic derangements leading to encephalopathy.  Head CT: No acute finding by CT. age related volume loss. Chronic  small-vessel ischemic changes of the white matter.  CT chest: bilateral pleural effusions layering dependently, larger on the right than the left, with dependent atelectasis.   Hiatal hernia. Type of Study: Bedside Swallow Evaluation Previous Swallow Assessment: none Diet Prior to this Study: Regular;Thin liquids Temperature Spikes Noted: No (wbc 10.7) Respiratory Status: Nasal cannula (2L) History of Recent Intubation:  No Behavior/Cognition: Cooperative;Pleasant mood;Confused;Distractible;Requires cueing (awake/alert w/ cues) Oral Cavity Assessment: Dry (min) Oral Care Completed by SLP: Yes Oral Cavity - Dentition: Poor condition;Missing dentition Vision:  (n/a) Self-Feeding Abilities: Total assist;Needs assist;Needs set up (could hold Cup to drink) Patient Positioning: Upright in bed (needed full positioning support) Baseline Vocal Quality: Low vocal intensity (min) Volitional Cough: Cognitively unable to elicit Volitional Swallow: Unable to elicit    Oral/Motor/Sensory Function Overall Oral Motor/Sensory Function: Within functional limits (grossly - no unilateral weakness noted)   Ice Chips Ice chips: Impaired Presentation: Spoon (fed; 6 trials) Oral Phase Impairments: Poor awareness of bolus Oral Phase Functional Implications: Prolonged oral transit Pharyngeal Phase Impairments: Suspected delayed Swallow (possibly) Other Comments: pt masticated 2 trials   Thin Liquid Thin Liquid: Not tested    Nectar Thick Nectar Thick Liquid: Impaired Presentation: Cup;Self Fed;Spoon (fed; 5 tsp trials; 5 cup trials) Oral Phase Impairments: Poor awareness of bolus Oral phase functional implications: Prolonged oral transit (intermittent) Pharyngeal Phase Impairments: Suspected delayed Swallow   Honey Thick Honey Thick Liquid: Not tested   Puree Puree: Impaired Presentation: Spoon (fed; 10 trials) Oral Phase Impairments: Poor awareness of bolus;Reduced lingual movement/coordination Oral Phase Functional Implications: Prolonged oral transit Pharyngeal Phase Impairments: Suspected delayed Swallow   Solid     Solid: Not tested       Orinda Kenner, MS, CCC-SLP Speech Language Pathologist [facility] [phone] [place] 04/12/2021,2:30 PM

## 2021-04-12 NOTE — Progress Notes (Signed)
Pharmacy Antibiotic Note  Amber [name] is a 87 y.o. female admitted on 04/11/2021 with possible bacteremia.  Pharmacy has been consulted for Vancomycin  dosing.  -only 1 set Bcx drawn and BCID=poss MRSE.  May be contaminant -had 2 more sets drawn before Vancomycin given, to assess if true infection -pt does have a pacemaker  Plan: Vancomycin 1500mg  IV x 1 ordered for loading dose. Vancomycin 1000 mg IV Q 48 hrs. Goal AUC 400-550. Expected AUC: 473 SCr used: 0.95 Cmin: 9.7  -f/u Blood cx and renal fxn     Weight: 76.6 kg (168 lb 14 oz)  Temp (24hrs), Avg:98.2 F (36.8 C), Min:97.9 F (36.6 C), Max:98.6 F (37 C)  Recent Labs  Lab 04/11/21 1230 04/12/21 0540  WBC 9.5 10.7*  CREATININE 0.72 0.95  LATICACIDVEN 1.8  --     Estimated Creatinine Clearance: 36.6 mL/min (by C-G formula based on SCr of 0.95 mg/dL).    Allergies  Allergen Reactions   Ambien [Zolpidem Tartrate] Other (See Comments)    Reaction:  Keeps pt awake    Penicillins Itching and Other (See Comments)    Has patient had a PCN reaction causing immediate rash, facial/tongue/throat swelling, SOB or lightheadedness with hypotension: No Has patient had a PCN reaction causing severe rash involving mucus membranes or skin necrosis: No Has patient had a PCN reaction that required hospitalization No Has patient had a PCN reaction occurring within the last 10 years: No If all of the above answers are "NO", then may proceed with Cephalosporin use.   Iodine Itching   Succinylcholine Other (See Comments)    Reaction:  Unknown    Etodolac Other (See Comments)    GI upset   Nsaids Other (See Comments)    Reaction: gi upset    Antimicrobials this admission: Vanc 10/7   >>     Dose adjustments this admission:    Microbiology results: 10/6 BCx(x2- just 1 set): BCID= ?MRSE 10/7 Bcx 9x4- 2 sets drawn):   UCx:      Sputum:      MRSA PCR:     Thank you for allowing pharmacy to be a part of this  patient's care.  Amber [name] A 04/12/2021 12:12 PM

## 2021-04-12 NOTE — Consult Note (Addendum)
Neurology Consultation  Reason for Consult: Altered mental status Referring Physician: Dr. Rupert Stacks  CC: Altered mental status  History is obtained from: Chart review  HPI: Amber [name] is a 87 y.o. female past medical history of CHF, chronic back pain, coronary artery disease, diabetes, hypertension, hyperlipidemia, who comes from home for evaluation of altered mental status.  No family is present at bedside-history obtained from chart review. ER notes-patient brought in for concerns for altered mental status and shortness of breath with saturations at 88% requiring duo nebs by EMS. According to the chart review, daughter-in-law who is the [facility] POA states that patient started acting confused not knowing who the family members are when at baseline she is not confused.  No fevers or chills.  Has a baseline cough that has been unchanged.  No sick contacts.  No abdominal pain but nausea present.  No vomiting. She was admitted for work-up for altered mental status.  Noncontrast head CT was unremarkable. Cannot get MRI due to a pacemaker. Neurological consultation for further work-up and recommendations. I recommended obtaining B12, TSH and ammonia levels when I was called over the phone for nonemergent consultation last evening. Those levels are back-see below. Other labs-BNP was significantly elevated 3284.  LKW: Unclear tpa given?: no, unclear last well Premorbid modified Rankin scale (mRS): Unable to ascertain reliably  ROS:  Unable to obtain due to altered mental status.   Past Medical History:  Diagnosis Date   Anemia    Anxiety    Arthritis    Benign neoplasm of colon    CHF (congestive heart failure) (HCC)    Chronic kidney disease    Chronic pain    Complication of anesthesia 1980's   hard time waking up   Coronary artery disease with unspecified angina pectoris    Cough    Diabetes mellitus without complication (HCC)    Essential hypertension    GERD (gastroesophageal  reflux disease)    High risk medication use    Hyperlipidemia    Plantar fascial fibromatosis    Presence of permanent cardiac pacemaker    Shortness of breath dyspnea    Family History  Problem Relation Age of Onset   Heart attack Mother    Heart disease Father    Alzheimer's disease Sister    Cervical cancer Sister    Heart failure Son      Social History:   reports that she has never smoked. She has never used smokeless tobacco. She reports that she does not drink alcohol and does not use drugs.  Medications  Current Facility-Administered Medications:    acetaminophen (TYLENOL) tablet 650 mg, 650 mg, Oral, Q6H PRN **OR** acetaminophen (TYLENOL) suppository 650 mg, 650 mg, Rectal, Q6H PRN, Cox, Amy N, DO   albuterol (PROVENTIL) (2.5 MG/3ML) 0.083% nebulizer solution 2.5 mg, 2.5 mg, Nebulization, Q6H PRN, Cox, Amy N, DO   ALPRAZolam (XANAX) tablet 1 mg, 1 mg, Oral, Daily PRN, Cox, Amy N, DO   hydrALAZINE (APRESOLINE) tablet 10 mg, 10 mg, Oral, Q6H PRN, Cox, Amy N, DO   insulin aspart (novoLOG) injection 0-15 Units, 0-15 Units, Subcutaneous, TID WC, Cox, Amy N, DO   insulin aspart (novoLOG) injection 0-5 Units, 0-5 Units, Subcutaneous, QHS, Cox, Amy N, DO   insulin glargine-yfgn (SEMGLEE) injection 22 Units, 22 Units, Subcutaneous, Daily, Cox, Amy N, DO   levETIRAcetam (KEPPRA) tablet 500 mg, 500 mg, Oral, BID, Cox, Amy N, DO, 500 mg at 04/11/21 2212   ondansetron (ZOFRAN)  tablet 4 mg, 4 mg, Oral, Q6H PRN **OR** ondansetron (ZOFRAN) injection 4 mg, 4 mg, Intravenous, Q6H PRN, Cox, Amy N, DO   pantoprazole (PROTONIX) EC tablet 80 mg, 80 mg, Oral, Daily, Cox, Amy N, DO, 80 mg at 04/11/21 1622   pravastatin (PRAVACHOL) tablet 40 mg, 40 mg, Oral, QHS, Cox, Amy N, DO, 40 mg at 04/11/21 2212   vancomycin (VANCOREADY) IVPB 1250 mg/250 mL, 1,250 mg, Intravenous, Once, Berton Mount, RPH   Exam: Current vital signs: BP 119/80 (BP Location: Right Arm)   Pulse 63   Temp 98.6 F (37  C)   Resp 18   Wt 76.6 kg   SpO2 100%   BMI 34.11 kg/m  Vital signs in last 24 hours: Temp:  [97.9 F (36.6 C)-98.6 F (37 C)] 98.6 F (37 C) (10/07 0752) Pulse Rate:  [63-92] 63 (10/07 0752) Resp:  [17-22] 18 (10/07 0752) BP: (119-175)/(64-109) 119/80 (10/07 0752) SpO2:  [79 %-100 %] 100 % (10/07 0752) Weight:  [76.6 kg] 76.6 kg (10/07 0734) General: Drowsy, awakens to voice HNT: Normocephalic atraumatic Lungs: Scattered rales CVS: Regular rate rhythm Abdomen nondistended nontender Extremities with 1+ pedal edema Neurological exam She is drowsy, opens eyes to voice. Follows simple commands Speech is moderately dysarthric Extremely poor attention concentration Difficult to assess for aphasia at this time Cranial nerves: Pupils are equal round react light, extraocular movements appear unhindered, visual fields are full, face appears symmetric although she is edentulous creating a little bit of gross facial asymmetry, tongue and palate midline. Motor examination: She is able to raise all 4 extremities antigravity and is at least 4+/5 in all 4 extremities. Sensation intact to touch Coordination difficult to assess given her mentation 1a Level of Conscious.: 1 1b LOC Questions: 2 1c LOC Commands: 0 2 Best Gaze: 0 3 Visual: 0 4 Facial Palsy: 0 5a Motor Arm - left: 0 5b Motor Arm - Right: 0 6a Motor Leg - Left: 0 6b Motor Leg - Right: 0 7 Limb Ataxia: 0 8 Sensory: 0 9 Best Language: 0 10 Dysarthria: 2 11 Extinct. and Inatten.:0  TOTAL: 5   Labs I have reviewed labs in epic and the results pertinent to this consultation are:  CBC    Component Value Date/Time   WBC 10.7 (H) 04/12/2021 0540   RBC 4.01 04/12/2021 0540   HGB 10.8 (L) 04/12/2021 0540   HCT 32.9 (L) 04/12/2021 0540   PLT 290 04/12/2021 0540   MCV 82.0 04/12/2021 0540   MCH 26.9 04/12/2021 0540   MCHC 32.8 04/12/2021 0540   RDW 17.2 (H) 04/12/2021 0540   LYMPHSABS 1.0 04/11/2021 1230   MONOABS 1.0  04/11/2021 1230   EOSABS 0.0 04/11/2021 1230   BASOSABS 0.0 04/11/2021 1230    CMP     Component Value Date/Time   NA 132 (L) 04/12/2021 0540   NA 140 04/08/2017 1444   K 3.2 (L) 04/12/2021 0540   CL 92 (L) 04/12/2021 0540   CO2 27 04/12/2021 0540   GLUCOSE 62 (L) 04/12/2021 0540   BUN 24 (H) 04/12/2021 0540   BUN 20 04/08/2017 1444   CREATININE 0.95 04/12/2021 0540   CALCIUM 9.2 04/12/2021 0540   PROT 7.4 04/11/2021 1230   PROT 7.2 04/08/2017 1444   ALBUMIN 3.8 04/11/2021 1230   ALBUMIN 4.2 04/08/2017 1444   AST 45 (H) 04/11/2021 1230   ALT 21 04/11/2021 1230   ALKPHOS 75 04/11/2021 1230   BILITOT 1.4 (H) 04/11/2021 1230  BILITOT 0.6 04/08/2017 1444   GFRNONAA 58 (L) 04/12/2021 0540   GFRAA >60 02/26/2020 0536   Urinalysis unremarkable TSH normal, ammonia less than 10 B12-288 Chest x-ray with mild bibasilar subsegmental atelectasis  Blood culture 1/2 with Staph epidermidis.  Question if this is contaminant  Imaging I have reviewed the images obtained:  CT-head-no acute changes, moderate to severe chronic white matter disease.  Assessment:  87 year old with altered mental status in the setting of shortness of breath and possible CHF exacerbation. Low suspicion for this being the stroke.  More likely toxic metabolic derangements leading to encephalopathy.  Other contributing factors include low normal B12.  Would like B12 levels to be greater than 400. Also blood glucose on the BMP this morning was 62.  Recheck CBG and supplement. Rule out underlying electrographic abnormalities with an EEG   Impression: Multifactorial toxic metabolic encephalopathy  Recommendations: -TSH and ammonia within normal level.  B12 low normal-supplement parenterally for level goal of greater than 400. -Management of medical comorbidities per primary team as you are -Recheck CBG and correct hypoglycemia. -Repeat CT head 24 hours from the prior CT head to evaluate for any evolving  stroke although suspicion is low. -routine EEG I will follow the imaging & EEG with you Plan discussed in detail with Dr. Posey Pronto -- Amie Portland, MD Neurologist [facility] Pager: [phone]

## 2021-04-12 NOTE — Progress Notes (Signed)
PHARMACY - PHYSICIAN COMMUNICATION CRITICAL VALUE ALERT - BLOOD CULTURE IDENTIFICATION (BCID)  BCID:  2 of 2 growing Staph Epi, mecA detected.  Pt currently not on any abx.  Name of provider contacted: Rachael Fee, NP  Changes to prescribed antibiotics required: Vancomycin Per Pharmacy Consult   Renda Rolls, PharmD, [facility] 04/12/2021 7:04 AM

## 2021-04-12 NOTE — Plan of Care (Signed)

## 2021-04-12 NOTE — Progress Notes (Signed)
[facility] [place] NAME: Amber [name]    MR#:  [id number]  DATE OF BIRTH:  Jul 10, 1929  SUBJECTIVE:  patient sitting up in the bed. Answered most questions appropriately. She was but confused last night. Much alert awake. No family at bedside earlier. Breathing comfortably. Denies chest pain. Feels hungry.  REVIEW OF SYSTEMS:   Review of Systems  Constitutional:  Negative for chills, fever and weight loss.  HENT:  Negative for ear discharge, ear pain and nosebleeds.   Eyes:  Negative for blurred vision, pain and discharge.  Respiratory:  Positive for shortness of breath. Negative for sputum production, wheezing and stridor.   Cardiovascular:  Negative for chest pain, palpitations, orthopnea and PND.  Gastrointestinal:  Negative for abdominal pain, diarrhea, nausea and vomiting.  Genitourinary:  Negative for frequency and urgency.  Musculoskeletal:  Negative for back pain and joint pain.  Neurological:  Positive for weakness. Negative for sensory change, speech change and focal weakness.  Psychiatric/Behavioral:  Negative for depression and hallucinations. The patient is not nervous/anxious.   Tolerating Diet:yes Tolerating PT:   DRUG ALLERGIES:   Allergies  Allergen Reactions   Ambien [Zolpidem Tartrate] Other (See Comments)    Reaction:  Keeps pt awake    Penicillins Itching and Other (See Comments)    Has patient had a PCN reaction causing immediate rash, facial/tongue/throat swelling, SOB or lightheadedness with hypotension: No Has patient had a PCN reaction causing severe rash involving mucus membranes or skin necrosis: No Has patient had a PCN reaction that required hospitalization No Has patient had a PCN reaction occurring within the last 10 years: No If all of the above answers are "NO", then may proceed with Cephalosporin use.   Iodine Itching   Succinylcholine Other (See Comments)    Reaction:  Unknown     Etodolac Other (See Comments)    GI upset   Nsaids Other (See Comments)    Reaction: gi upset    VITALS:  Blood pressure 116/75, pulse 61, temperature 98 F (36.7 C), resp. rate 16, weight 76.6 kg, SpO2 98 %.  PHYSICAL EXAMINATION:   Physical Exam  GENERAL:  87 y.o.-year-old patient lying in the bed with no acute distress.obese  HEENT: Head atraumatic, normocephalic. Oropharynx and nasopharynx clear.  NECK:  Supple, no jugular venous distention. No thyroid enlargement, no tenderness.  LUNGS: decreased breath sounds bilaterally, no wheezing, rales, rhonchi. No use of accessory muscles of respiration.  CARDIOVASCULAR: S1, S2 normal. No murmurs, rubs, or gallops.  ABDOMEN: Soft, nontender, nondistended. Bowel sounds present. No organomegaly or mass.  EXTREMITIES: No cyanosis, clubbing or edema b/l.    NEUROLOGIC: non focal PSYCHIATRIC:  patient is alert and oriented x 2.  SKIN: No obvious rash, lesion, or ulcer.   LABORATORY PANEL:  CBC Recent Labs  Lab 04/12/21 0540  WBC 10.7*  HGB 10.8*  HCT 32.9*  PLT 290    Chemistries  Recent Labs  Lab 04/11/21 1230 04/12/21 0540  NA 133* 132*  K 3.7 3.2*  CL 92* 92*  CO2 25 27  GLUCOSE 75 62*  BUN 18 24*  CREATININE 0.72 0.95  CALCIUM 9.7 9.2  AST 45*  --   ALT 21  --   ALKPHOS 75  --   BILITOT 1.4*  --    Cardiac Enzymes No results for input(s): TROPONINI in the last 168 hours. RADIOLOGY:  CT HEAD WO CONTRAST (5MM)  Result Date: 04/11/2021 CLINICAL DATA:  Mental status changes of unknown cause. Confusion and shortness of breath. EXAM: CT HEAD WITHOUT CONTRAST TECHNIQUE: Contiguous axial images were obtained from the base of the skull through the vertex without intravenous contrast. COMPARISON:  01/18/2017 FINDINGS: Brain: Age related volume loss. Extensive chronic small-vessel ischemic changes of the cerebral hemispheric white matter. No sign of acute infarction, mass lesion, hemorrhage, hydrocephalus or extra-axial  collection. Vascular: There is atherosclerotic calcification of the major vessels at the base of the brain. Skull: Negative Sinuses/Orbits: Clear/normal Other: None IMPRESSION: No acute finding by CT. Age related volume loss. Chronic small-vessel ischemic changes of the white matter. Electronically Signed   By: Nelson Chimes M.D.   On: 04/11/2021 14:59   US Carotid Bilateral  Result Date: 04/12/2021 CLINICAL DATA:  Neurocognitive deficits, hypertension, coronary artery disease, hyperlipidemia, diabetes EXAM: BILATERAL CAROTID DUPLEX ULTRASOUND TECHNIQUE: Pearline Cables scale imaging, color Doppler and duplex ultrasound were performed of bilateral carotid and vertebral arteries in the neck. Technologist describes technically difficult study secondary to altered mental status and agitation. COMPARISON:  01/06/2017 and previous FINDINGS: Criteria: Quantification of carotid stenosis is based on velocity parameters that correlate the residual internal carotid diameter with NASCET-based stenosis levels, using the diameter of the distal internal carotid lumen as the denominator for stenosis measurement. The following velocity measurements were obtained: RIGHT ICA:   80/19 cm/sec CCA: 195/09 cm/sec SYSTOLIC ICA/CCA RATIO:  0.7 ECA: 153 cm/sec LEFT ICA: 87/21 cm/sec CCA: 32/6 cm/sec SYSTOLIC ICA/CCA RATIO:  1.4 ECA: 91 cm/sec RIGHT CAROTID ARTERY: Irregular partially calcified plaque in the distal common carotid artery, bulb, and proximal ICA resulting in at least mild stenosis. Normal waveforms and color Doppler signal throughout however. No high-grade stenosis. RIGHT VERTEBRAL ARTERY:  Normal flow direction and waveform. LEFT CAROTID ARTERY: Partially calcified plaque in the distal common carotid, bulb and proximal ICA resulting in at least mild stenosis. Some distal acoustic shadowing. Normal waveforms and color Doppler signal throughout. LEFT VERTEBRAL ARTERY:  Normal flow direction and waveform. IMPRESSION: 1. Bilateral  carotid bifurcation plaque resulting in less than 50% diameter ICA stenosis. 2. Antegrade bilateral vertebral arterial flow. Electronically Signed   By: Lucrezia Europe M.D.   On: 04/12/2021 14:29   DG Chest Portable 1 View  Result Date: 04/11/2021 CLINICAL DATA:  Shortness of breath. EXAM: PORTABLE CHEST 1 VIEW COMPARISON:  February 24, 2020. FINDINGS: Stable cardiomegaly. Left-sided pacemaker is unchanged in position. No pneumothorax or pleural effusion is noted. Mild bibasilar subsegmental atelectasis is noted. Bony thorax is unremarkable. IMPRESSION: Mild bibasilar subsegmental atelectasis. Electronically Signed   By: Marijo Conception M.D.   On: 04/11/2021 13:10   CT CHEST ABDOMEN PELVIS WO CONTRAST  Result Date: 04/11/2021 CLINICAL DATA:  Shortness of breath.  Confusion. EXAM: CT CHEST, ABDOMEN AND PELVIS WITHOUT CONTRAST TECHNIQUE: Multidetector CT imaging of the chest, abdomen and pelvis was performed following the standard protocol without IV contrast. COMPARISON:  Chest radiography same day.  CT abdomen 02/08/2018 FINDINGS: CT CHEST FINDINGS Cardiovascular: The heart is enlarged. Pacemaker leads in place. Previous median sternotomy and CABG. No pericardial effusion. Aortic atherosclerotic calcification is present. Mediastinum/Nodes: No evidence of mediastinal or hilar mass on this noncontrast exam. Moderate size hiatal hernia. Lungs/Pleura: Small bilateral pleural effusions, larger on the right than the left. Dependent atelectasis associated with those effusions. No visible pneumonia. No mass or nodule. Musculoskeletal: Chronic degenerative changes throughout the thoracic spine. CT ABDOMEN PELVIS FINDINGS Hepatobiliary: Liver parenchyma appears normal without contrast. No calcified gallstones. Pancreas: Normal Spleen: Normal Adrenals/Urinary  Tract: Adrenal glands are normal. Kidneys are normal. No mass, stone or hydronephrosis. Bladder is normal. Stomach/Bowel: Hiatal hernia as noted above. Small bowel  appears normal. There is diverticulosis of the left colon but no evidence of diverticulitis. Vascular/Lymphatic: Aortic atherosclerosis. No aneurysm. IVC is normal. No retroperitoneal adenopathy. Reproductive: Previous hysterectomy.  No pelvic mass. Other: No free fluid or air. Musculoskeletal: Previous lumbar fusion surgery at L2-3. Chronic degenerative changes above and below that. Bilateral hip replacements. IMPRESSION: CT chest: Bilateral pleural effusions layering dependently, larger on the right than the left, with dependent atelectasis. Cardiomegaly.  Previous CABG.  Pacemaker. Hiatal hernia. CT abdomen pelvis: No acute or significant finding. Aortic Atherosclerosis (ICD10-I70.0). Diverticulosis without evidence of diverticulitis. Degenerative changes throughout the spine without evidence of fracture or focal lesion. Electronically Signed   By: Nelson Chimes M.D.   On: 04/11/2021 15:03   ASSESSMENT AND PLAN:   Delainee Tramel is a 87 y.o. female with medical history significant for CAD, COPD, OSA, history of insulin-dependent diabetes mellitus, hyperlipidemia, anxiety, seizure disorder, atrial fibrillation, TIA, who presents to the emergency department for chief concerns of altered mental status.   Altered mental status/acute metabolic encephalopathy to acute respiratory failure with hypoxia in the setting of congestive heart failure combined diastolic systolic acute on chronic -- patient came in with shortness of breath and confusion. Her sats were in the upper 80s. -- patient's mentation much improved answered question approved early-- BNP more than 3000. Will continue IV Lasix daily the-- overnight pulse oximetry and assess for home oxygen use -- will resume cardiac meds once med rec has been completed -- patient [facility] at [facility] Dr. Nehemiah Massed -- monitor input output, creatinine. -- Patient was seen by neurology  One out of two positive blood culture history of  pacemaker -- patient received one dose of vancomycin -- discussed with ID pharmacist will repeat blood culture. Continue to follow if remains negative discontinue antibiotic  type II diabetes, hyperglycemia Hyperlipidemia -- resume home dose insulin and sliding scale -- continue pravastatin  history of seizure disorder -- on Keppra  history of a fib -- on Coreg and eliquis will resume on CT repeat is negative  obstructive sleep apnea -- CPAP acute nightly  anxiety chronic -- PRN Xanax     Procedures: Family communication : daughter-in-law [name] Consults : CODE STATUS: DNR according to daughter-in-law who is HCP OA DVT Prophylaxis : eliquis Level of care: Med-Surg Status is: Inpatient    Dispo: The patient is from: Home              Anticipated d/c is to: Home              Patient currently is not medically stable to d/c.   Difficult to place patient No        TOTAL TIME TAKING CARE OF THIS PATIENT: 25 minutes.  >50% time spent on counselling and coordination of care  Note: This dictation was prepared with Dragon dictation along with smaller phrase technology. Any transcriptional errors that result from this process are unintentional.  Fritzi Mandes M.D    [facility]   CC: Primary care physician; Idelle Crouch, MD Patient ID: Piedad Climes, female   DOB: 10/23/1929, 87 y.o.   MRN: [id number]

## 2021-04-12 NOTE — Progress Notes (Signed)
*  PRELIMINARY RESULTS* Echocardiogram 2D Echocardiogram has been performed.  Amber [name] Amber [name] 04/12/2021, 11:52 AM

## 2021-04-12 NOTE — Progress Notes (Signed)
EEG complete - results pending 

## 2021-04-12 NOTE — Progress Notes (Signed)
Inpatient Diabetes Program Recommendations  AACE/ADA: New Consensus Statement on Inpatient Glycemic Control (2015)  Target Ranges:  Prepandial:   less than 140 mg/dL      Peak postprandial:   less than 180 mg/dL (1-2 hours)      Critically ill patients:  140 - 180 mg/dL  Results for ELAYSHA, BEVARD (MRN [id number]) as of 04/12/2021 13:20  Ref. Range 04/11/2021 20:59 04/11/2021 22:02 04/12/2021 10:52 04/12/2021 11:44  Glucose-Capillary Latest Ref Range: 70 - 99 mg/dL 62 (L) 101 (H) 53 (L) 80     Home DM Meds: Lantus 22 units Daily  Current Orders: Semglee 22 units daily     Novolog 0-15 units TID AC + HS      MD- Please note that Semglee 22 units was HELD this AM  Patient has been having Hypoglycemia and No insulin has been given since admitted  Please consider:  1. Reduce/Change Semglee to 10 units QHS (start Later tonight) 50% home dose  2. Reduce Novolog SSi to the 0-9 unit sensitive scale     --Will follow patient during hospitalization--  Wyn Quaker RN, MSN, CDE Diabetes Coordinator Inpatient Glycemic Control Team Team Pager: [phone] (8a-5p) a

## 2021-04-12 NOTE — Progress Notes (Signed)
Initial Nutrition Assessment  DOCUMENTATION CODES:  Obesity unspecified  INTERVENTION:  Continue current diet as ordered per SLP, advance as able Nepro Shake po BID, each supplement provides 425 kcal and 19 grams protein  NUTRITION DIAGNOSIS:  Inadequate oral intake related to decreased appetite as evidenced by per patient/family report.  GOAL:  Patient will meet greater than or equal to 90% of their needs  MONITOR:  PO intake, Supplement acceptance, Diet advancement  REASON FOR ASSESSMENT:  Malnutrition Screening Tool    ASSESSMENT:  87 y.o. female with hx of CAD, CHF, CKD, DM type 2, HTN, HLD, atrial fibrillation, GERD, and COPD presented to ED from home with SOB and confusion.  Pt sleeping at the time of visit, did not wake during physical exam. Family member at bedside able to provide a nutrition hx and reports that pt received medication prior to procedure earlier and has been sleepy since then.   Family reports that normally pt's appetite is far at home. Eats 3 meals each day but portions are not very large. Has eaten very little over the past few days PTA and has had no intake since admission. Daughter reports some weight gain recently, likely related to fluid. Stable weight ~160 lb.   Family reports that pt has had nutrition supplements in the past, agreeable to pt receiving while appetite is decreased from baseline. Will add Nepro as pt is currently on nectar thick liquids.   Nutritionally Relevant Medications: Scheduled Meds:  insulin aspart  0-15 Units Subcutaneous TID WC   insulin aspart  0-5 Units Subcutaneous QHS   insulin glargine-yfgn  22 Units Subcutaneous Daily   levETIRAcetam  500 mg Oral BID   pantoprazole  80 mg Oral Daily   pravastatin  40 mg Oral QHS   PRN Meds: ondansetron  Labs Reviewed: Na 132 K 3.2 BUN 24 SBG ranges from 53-101 mg/dL over the last 24 hours HgbA1c 7.7% (9/7, [place])  NUTRITION - FOCUSED PHYSICAL EXAM: Flowsheet  Row Most Recent Value  Orbital Region No depletion  Upper Arm Region No depletion  Thoracic and Lumbar Region No depletion  Buccal Region No depletion  Temple Region No depletion  Clavicle Bone Region No depletion  Clavicle and Acromion Bone Region No depletion  Scapular Bone Region No depletion  Dorsal Hand No depletion  Patellar Region No depletion  Anterior Thigh Region No depletion  Posterior Calf Region Mild depletion  Edema (RD Assessment) Mild  [BLE]  Hair Reviewed  Eyes Reviewed  Mouth Reviewed  Skin Reviewed  Nails Reviewed   Diet Order:   Diet Order             DIET - DYS 1 Room service appropriate? Yes with Assist; Fluid consistency: Nectar Thick  Diet effective now                  EDUCATION NEEDS:  No education needs have been identified at this time  Skin:  Skin Assessment: Reviewed RN Assessment  Last BM:  unsure  Height:  Ht Readings from Last 1 Encounters:  04/12/21 4\' 11"  (1.499 m)   Weight:  Wt Readings from Last 1 Encounters:  04/12/21 76.6 kg   Ideal Body Weight:  44.3 kg  BMI:  Body mass index is 34.11 kg/m.  Estimated Nutritional Needs:  Kcal:  1400-1600 kcal/d Protein:  70-80 g/d Fluid:  1.4-1.5L/d   Ranell Patrick, RD, LDN Clinical Dietitian RD pager # available in AMION  After hours/weekend pager # available in  AMION

## 2021-04-12 NOTE — TOC Progression Note (Signed)
Transition of Care [facility]) - Progression Note    Patient Details  Name: Meah Jiron MRN: [id number] Date of Birth: Aug 31, 1929  Transition of [facility]) CM/SW Hannah, RN Phone Number: 04/12/2021, 2:09 PM  Clinical Narrative:   Met with the patient's daughter in law in the room, the patient lives with her son and daughter in law as well as grand children, She has a rolling walker and an old wheelchair at home, she is mostly in the wheelchair, the daughter in law ststed that she needs a new wheelchair tht the old one is approx 85 years old She is open with [facility] OT and will plan to continue with them once she discharges, the daughter in law feel that the patient may need Home oxygen, I explained that if she meets the criteria then Home oxygen will be set up for her. TOC to continue to follow for needs         Expected Discharge Plan and Services                                                 Social Determinants of Health (SDOH) Interventions    Readmission Risk Interventions No flowsheet data found.

## 2021-04-13 LAB — GLUCOSE, CAPILLARY
Glucose-Capillary: 100 mg/dL — ABNORMAL HIGH (ref 70–99)
Glucose-Capillary: 98 mg/dL (ref 70–99)
Glucose-Capillary: 94 mg/dL (ref 70–99)
Glucose-Capillary: 151 mg/dL — ABNORMAL HIGH (ref 70–99)
Glucose-Capillary: 213 mg/dL — ABNORMAL HIGH (ref 70–99)

## 2021-04-13 LAB — BLOOD GAS, ARTERIAL
Acid-Base Excess: 2.8 mmol/L — ABNORMAL HIGH (ref 0.0–2.0)
Bicarbonate: 27.9 mmol/L (ref 20.0–28.0)
FIO2: 0.28
O2 Saturation: 95.5 %
Patient temperature: 37
pCO2 arterial: 44 mmHg (ref 32.0–48.0)
pH, Arterial: 7.41 (ref 7.350–7.450)
pO2, Arterial: 78 mmHg — ABNORMAL LOW (ref 83.0–108.0)

## 2021-04-13 LAB — CREATININE, SERUM
Creatinine, Ser: 1.36 mg/dL — ABNORMAL HIGH (ref 0.44–1.00)
GFR, Estimated: 37 mL/min — ABNORMAL LOW (ref >60)

## 2021-04-13 LAB — BASIC METABOLIC PANEL
Anion gap: 10 (ref 5–15)
BUN: 28 mg/dL — ABNORMAL HIGH (ref 8–23)
CO2: 28 mmol/L (ref 22–32)
Calcium: 8.5 mg/dL — ABNORMAL LOW (ref 8.9–10.3)
Chloride: 93 mmol/L — ABNORMAL LOW (ref 98–111)
Creatinine, Ser: 1.26 mg/dL — ABNORMAL HIGH (ref 0.44–1.00)
GFR, Estimated: 41 mL/min — ABNORMAL LOW (ref >60)
Glucose, Bld: 96 mg/dL (ref 70–99)
Potassium: 3.5 mmol/L (ref 3.5–5.1)
Sodium: 131 mmol/L — ABNORMAL LOW (ref 135–145)

## 2021-04-13 LAB — MAGNESIUM: Magnesium: 1.9 mg/dL (ref 1.7–2.4)

## 2021-04-13 MED ORDER — SODIUM CHLORIDE 0.9 % IV SOLN
250.0000 mg | Freq: Two times a day (BID) | INTRAVENOUS | Status: DC
  Administered 2021-04-13 (×2): 250 mg via INTRAVENOUS
  Filled 2021-04-13 (×4): qty 2.5

## 2021-04-13 MED ORDER — ALPRAZOLAM 0.5 MG PO TABS: 0.5000 mg | ORAL_TABLET | Freq: Every day | ORAL | Status: DC | PRN

## 2021-04-13 MED ORDER — IPRATROPIUM-ALBUTEROL 0.5-2.5 (3) MG/3ML IN SOLN
3.0000 mL | RESPIRATORY_TRACT | Status: DC | PRN
Start: 2021-04-13 — End: 2021-04-16

## 2021-04-13 MED ORDER — POTASSIUM CHLORIDE CRYS ER 20 MEQ PO TBCR: 40.0000 meq | EXTENDED_RELEASE_TABLET | Freq: Once | ORAL | Status: DC

## 2021-04-13 MED ORDER — APIXABAN 5 MG PO TABS
5.0000 mg | ORAL_TABLET | Freq: Two times a day (BID) | ORAL | Status: DC
  Administered 2021-04-13 – 2021-04-16 (×6): 5 mg via ORAL
  Filled 2021-04-13 (×6): qty 1

## 2021-04-13 MED ORDER — POTASSIUM CHLORIDE 10 MEQ/100ML IV SOLN
10.0000 meq | INTRAVENOUS | Status: AC
Start: 2021-04-13 — End: 2021-04-13
  Administered 2021-04-13 (×4): 10 meq via INTRAVENOUS
  Filled 2021-04-13 (×4): qty 100

## 2021-04-13 MED ORDER — VANCOMYCIN HCL 750 MG/150ML IV SOLN: 750.0000 mg | INTRAVENOUS | Status: DC

## 2021-04-13 NOTE — Progress Notes (Signed)
[facility] NAME: Amber [name]    MR#:  [id number]  DATE OF BIRTH:  19-Nov-1929  SUBJECTIVE:  Patient very lethargic this morning. Got a dose of Xanax yday afternoon. Opens eyes to VC--says a few words and falls asleep  REVIEW OF SYSTEMS:   Review of Systems  Unable to perform ROS: Mental status change  Tolerating Diet:yes Tolerating PT:   DRUG ALLERGIES:   Allergies  Allergen Reactions   Ambien [Zolpidem Tartrate] Other (See Comments)    Reaction:  Keeps pt awake    Penicillins Itching and Other (See Comments)    Has patient had a PCN reaction causing immediate rash, facial/tongue/throat swelling, SOB or lightheadedness with hypotension: No Has patient had a PCN reaction causing severe rash involving mucus membranes or skin necrosis: No Has patient had a PCN reaction that required hospitalization No Has patient had a PCN reaction occurring within the last 10 years: No If all of the above answers are "NO", then may proceed with Cephalosporin use.   Iodine Itching   Succinylcholine Other (See Comments)    Reaction:  Unknown    Etodolac Other (See Comments)    GI upset   Nsaids Other (See Comments)    Reaction: gi upset    VITALS:  Blood pressure (!) 129/57, pulse (!) 59, temperature 97.7 F (36.5 C), temperature source Axillary, resp. rate 16, height 4\' 11"  (1.499 m), weight 76.6 kg, SpO2 100 %.  PHYSICAL EXAMINATION:   Physical Exam  GENERAL:  87 y.o.-year-old patient lying in the bed with no acute distress.obese  HEENT: Head atraumatic, normocephalic. Oropharynx and nasopharynx clear.  NECK:  Supple, no jugular venous distention. No thyroid enlargement, no tenderness.  LUNGS: decreased breath sounds bilaterally, no wheezing, rales, rhonchi. No use of accessory muscles of respiration.  CARDIOVASCULAR: S1, S2 normal. No murmurs, rubs, or gallops.  ABDOMEN: Soft, nontender, nondistended. Bowel sounds  present. No organomegaly or mass.  EXTREMITIES: No cyanosis, clubbing or edema b/l.    NEUROLOGIC: non focal PSYCHIATRIC:  patient is lethargic SKIN: No obvious rash, lesion, or ulcer.   LABORATORY PANEL:  CBC Recent Labs  Lab 04/12/21 0540  WBC 10.7*  HGB 10.8*  HCT 32.9*  PLT 290     Chemistries  Recent Labs  Lab 04/11/21 1230 04/12/21 0540 04/12/21 2345 04/13/21 0444  NA 133*   < > 131*  --   K 3.7   < > 3.5  --   CL 92*   < > 93*  --   CO2 25   < > 28  --   GLUCOSE 75   < > 96  --   BUN 18   < > 28*  --   CREATININE 0.72   < > 1.26* 1.36*  CALCIUM 9.7   < > 8.5*  --   MG  --   --  1.9  --   AST 45*  --   --   --   ALT 21  --   --   --   ALKPHOS 75  --   --   --   BILITOT 1.4*  --   --   --    < > = values in this interval not displayed.    Cardiac Enzymes No results for input(s): TROPONINI in the last 168 hours. RADIOLOGY:  CT HEAD WO CONTRAST (5MM)  Result Date: 04/12/2021 CLINICAL DATA:  Neuro deficit acute stroke suspected. EXAM:  CT HEAD WITHOUT CONTRAST TECHNIQUE: Contiguous axial images were obtained from the base of the skull through the vertex without intravenous contrast. COMPARISON:  Head CT 04/11/2021 FINDINGS: Brain: Stable age related cerebral atrophy, ventriculomegaly and periventricular white matter disease. No extra-axial fluid collections are identified. No CT findings for acute hemispheric infarction or intracranial hemorrhage. No mass lesions. The brainstem and cerebellum are normal. Vascular: Stable vascular calcifications. No aneurysm or hyperdense vessels. Skull: No skull fracture or bone lesions. Sinuses/Orbits: The paranasal sinuses and mastoid air cells are clear. Other: No scalp lesions or scalp hematoma. IMPRESSION: 1. Stable age related cerebral atrophy, ventriculomegaly and periventricular white matter disease. 2. No acute intracranial findings or mass lesions. Electronically Signed   By: Marijo Sanes M.D.   On: 04/12/2021 14:57   CT  HEAD WO CONTRAST (5MM)  Result Date: 04/11/2021 CLINICAL DATA:  Mental status changes of unknown cause. Confusion and shortness of breath. EXAM: CT HEAD WITHOUT CONTRAST TECHNIQUE: Contiguous axial images were obtained from the base of the skull through the vertex without intravenous contrast. COMPARISON:  01/18/2017 FINDINGS: Brain: Age related volume loss. Extensive chronic small-vessel ischemic changes of the cerebral hemispheric white matter. No sign of acute infarction, mass lesion, hemorrhage, hydrocephalus or extra-axial collection. Vascular: There is atherosclerotic calcification of the major vessels at the base of the brain. Skull: Negative Sinuses/Orbits: Clear/normal Other: None IMPRESSION: No acute finding by CT. Age related volume loss. Chronic small-vessel ischemic changes of the white matter. Electronically Signed   By: Nelson Chimes M.D.   On: 04/11/2021 14:59   US Carotid Bilateral  Result Date: 04/12/2021 CLINICAL DATA:  Neurocognitive deficits, hypertension, coronary artery disease, hyperlipidemia, diabetes EXAM: BILATERAL CAROTID DUPLEX ULTRASOUND TECHNIQUE: Pearline Cables scale imaging, color Doppler and duplex ultrasound were performed of bilateral carotid and vertebral arteries in the neck. Technologist describes technically difficult study secondary to altered mental status and agitation. COMPARISON:  01/06/2017 and previous FINDINGS: Criteria: Quantification of carotid stenosis is based on velocity parameters that correlate the residual internal carotid diameter with NASCET-based stenosis levels, using the diameter of the distal internal carotid lumen as the denominator for stenosis measurement. The following velocity measurements were obtained: RIGHT ICA:   80/19 cm/sec CCA: 542/70 cm/sec SYSTOLIC ICA/CCA RATIO:  0.7 ECA: 153 cm/sec LEFT ICA: 87/21 cm/sec CCA: 62/3 cm/sec SYSTOLIC ICA/CCA RATIO:  1.4 ECA: 91 cm/sec RIGHT CAROTID ARTERY: Irregular partially calcified plaque in the distal common  carotid artery, bulb, and proximal ICA resulting in at least mild stenosis. Normal waveforms and color Doppler signal throughout however. No high-grade stenosis. RIGHT VERTEBRAL ARTERY:  Normal flow direction and waveform. LEFT CAROTID ARTERY: Partially calcified plaque in the distal common carotid, bulb and proximal ICA resulting in at least mild stenosis. Some distal acoustic shadowing. Normal waveforms and color Doppler signal throughout. LEFT VERTEBRAL ARTERY:  Normal flow direction and waveform. IMPRESSION: 1. Bilateral carotid bifurcation plaque resulting in less than 50% diameter ICA stenosis. 2. Antegrade bilateral vertebral arterial flow. Electronically Signed   By: Lucrezia Europe M.D.   On: 04/12/2021 14:29   EEG adult  Result Date: 04/13/2021 Lora Havens, MD     04/13/2021  9:34 AM Patient Name: Amber [name] MRN: [id number] Epilepsy Attending: Lora Havens Referring Physician/Provider: Dr Rupert Stacks Date: 04/13/2021 Duration: 25.46 mins Patient history: 87yo F with ams. EEG to evaluate for seizure Level of alertness: Awake AEDs during EEG study: None Technical aspects: This EEG study was done with scalp electrodes positioned according to the  10-20 International system of electrode placement. Electrical activity was acquired at a sampling rate of 500Hz  and reviewed with a high frequency filter of 70Hz  and a low frequency filter of 1Hz . EEG data were recorded continuously and digitally stored. Description: No posterior dominant rhythm was seen. EEG showed continuous generalized 3 to 6 Hz theta-delta slowing. Generalized triphasic waves were also noted intermittently.  Photic driving was not seen during photic stimulation. Hyperventilation was not performed.   ABNORMALITY - Continuous slow, generalized - Triphasic waves, generalized IMPRESSION: This study is suggestive of moderate diffuse encephalopathy, nonspecific etiology but likely related to toxic-metabolic causes. No seizures or definite  epileptiform discharges were seen throughout the recording. Of note, report was delayed as eeg was saved locally and not available for review. Lora Havens   ECHOCARDIOGRAM COMPLETE  Result Date: 04/12/2021    ECHOCARDIOGRAM REPORT   Patient Name:   Amber [name] Date of Exam: 04/12/2021 Medical Rec #:  [id number]           Height:       59.0 in Accession #:    [id number]          Weight:       168.9 lb Date of Birth:  04/18/1930          BSA:          1.716 m Patient Age:    8 years            BP:           119/80 mmHg Patient Gender: F                   HR:           59 bpm. Exam Location:  [facility] Procedure: 2D Echo, Color Doppler and Cardiac Doppler Indications:     Elevated troponin; R06.00 Dyspnea  History:         Patient has prior history of Echocardiogram examinations, most                  recent 12/06/2019. CHF, CAD, Pacemaker, CKD; Risk                  Factors:Hypertension, Diabetes and Dyslipidemia.  Sonographer:     Charmayne Sheer Referring Phys:  [id number] AMY N COX Diagnosing Phys: Yolonda Kida MD  Sonographer Comments: Suboptimal apical window and no subcostal window. Image acquisition challenging due to patient body habitus and Image acquisition challenging due to uncooperative patient. IMPRESSIONS  1. Left ventricular ejection fraction, by estimation, is 30 to 35%. The left ventricle has moderately decreased function. The left ventricle demonstrates global hypokinesis. Left ventricular diastolic parameters were normal.  2. Right ventricular systolic function is low normal. The right ventricular size is normal.  3. The mitral valve is normal in structure. Mild mitral valve regurgitation.  4. The aortic valve is normal in structure. Aortic valve regurgitation is trivial. Severe aortic valve stenosis. FINDINGS  Left Ventricle: Left ventricular ejection fraction, by estimation, is 30 to 35%. The left ventricle has moderately decreased function. The left ventricle demonstrates global  hypokinesis. The left ventricular internal cavity size was normal in size. There is no left ventricular hypertrophy. Left ventricular diastolic parameters were normal. Right Ventricle: The right ventricular size is normal. No increase in right ventricular wall thickness. Right ventricular systolic function is low normal. Left Atrium: Left atrial size was normal in size. Right Atrium: Right atrial size was normal in size.  Pericardium: There is no evidence of pericardial effusion. Mitral Valve: The mitral valve is normal in structure. Mild mitral valve regurgitation. MV peak gradient, 6.0 mmHg. The mean mitral valve gradient is 2.0 mmHg. Tricuspid Valve: The tricuspid valve is normal in structure. Tricuspid valve regurgitation is trivial. Aortic Valve: The aortic valve is normal in structure. Aortic valve regurgitation is trivial. Severe aortic stenosis is present. Aortic valve mean gradient measures 11.0 mmHg. Aortic valve peak gradient measures 19.4 mmHg. Aortic valve area, by VTI measures 0.62 cm. Pulmonic Valve: The pulmonic valve was normal in structure. Pulmonic valve regurgitation is not visualized. Aorta: The ascending aorta was not well visualized. IAS/Shunts: No atrial level shunt detected by color flow Doppler.  LEFT VENTRICLE PLAX 2D LVIDd:         5.30 cm   Diastology LVIDs:         4.40 cm   LV e' medial:    7.40 cm/s LV PW:         1.00 cm   LV E/e' medial:  13.3 LV IVS:        0.90 cm   LV e' lateral:   13.20 cm/s LVOT diam:     1.60 cm   LV E/e' lateral: 7.4 LV SV:         29 LV SV Index:   17 LVOT Area:     2.01 cm  RIGHT VENTRICLE RV Basal diam:  3.10 cm LEFT ATRIUM           Index        RIGHT ATRIUM           Index LA diam:      5.30 cm 3.09 cm/m   RA Area:     13.00 cm LA Vol (A2C): 34.1 ml 19.87 ml/m  RA Volume:   28.60 ml  16.66 ml/m LA Vol (A4C): 51.9 ml 30.24 ml/m  AORTIC VALVE                     PULMONIC VALVE AV Area (Vmax):    0.68 cm      PV Vmax:       0.73 m/s AV Area (Vmean):    0.74 cm      PV Vmean:      48.000 cm/s AV Area (VTI):     0.62 cm      PV VTI:        0.111 m AV Vmax:           220.50 cm/s   PV Peak grad:  2.1 mmHg AV Vmean:          152.000 cm/s  PV Mean grad:  1.0 mmHg AV VTI:            0.460 m AV Peak Grad:      19.4 mmHg AV Mean Grad:      11.0 mmHg LVOT Vmax:         75.10 cm/s LVOT Vmean:        55.600 cm/s LVOT VTI:          0.142 m LVOT/AV VTI ratio: 0.31  AORTA Ao Root diam: 2.70 cm MITRAL VALVE               TRICUSPID VALVE MV Area (PHT): 4.19 cm    TR Peak grad:   19.4 mmHg MV Area VTI:   0.92 cm    TR Vmax:        220.00 cm/s  MV Peak grad:  6.0 mmHg MV Mean grad:  2.0 mmHg    SHUNTS MV Vmax:       1.22 m/s    Systemic VTI:  0.14 m MV Vmean:      68.4 cm/s   Systemic Diam: 1.60 cm MV Decel Time: 181 msec MV E velocity: 98.10 cm/s MV A velocity: 30.00 cm/s MV E/A ratio:  3.27 Dwayne Prince Rome MD Electronically signed by Yolonda Kida MD Signature Date/Time: 04/12/2021/11:50:04 PM    Final    CT CHEST ABDOMEN PELVIS WO CONTRAST  Result Date: 04/11/2021 CLINICAL DATA:  Shortness of breath.  Confusion. EXAM: CT CHEST, ABDOMEN AND PELVIS WITHOUT CONTRAST TECHNIQUE: Multidetector CT imaging of the chest, abdomen and pelvis was performed following the standard protocol without IV contrast. COMPARISON:  Chest radiography same day.  CT abdomen 02/08/2018 FINDINGS: CT CHEST FINDINGS Cardiovascular: The heart is enlarged. Pacemaker leads in place. Previous median sternotomy and CABG. No pericardial effusion. Aortic atherosclerotic calcification is present. Mediastinum/Nodes: No evidence of mediastinal or hilar mass on this noncontrast exam. Moderate size hiatal hernia. Lungs/Pleura: Small bilateral pleural effusions, larger on the right than the left. Dependent atelectasis associated with those effusions. No visible pneumonia. No mass or nodule. Musculoskeletal: Chronic degenerative changes throughout the thoracic spine. CT ABDOMEN PELVIS FINDINGS Hepatobiliary:  Liver parenchyma appears normal without contrast. No calcified gallstones. Pancreas: Normal Spleen: Normal Adrenals/Urinary Tract: Adrenal glands are normal. Kidneys are normal. No mass, stone or hydronephrosis. Bladder is normal. Stomach/Bowel: Hiatal hernia as noted above. Small bowel appears normal. There is diverticulosis of the left colon but no evidence of diverticulitis. Vascular/Lymphatic: Aortic atherosclerosis. No aneurysm. IVC is normal. No retroperitoneal adenopathy. Reproductive: Previous hysterectomy.  No pelvic mass. Other: No free fluid or air. Musculoskeletal: Previous lumbar fusion surgery at L2-3. Chronic degenerative changes above and below that. Bilateral hip replacements. IMPRESSION: CT chest: Bilateral pleural effusions layering dependently, larger on the right than the left, with dependent atelectasis. Cardiomegaly.  Previous CABG.  Pacemaker. Hiatal hernia. CT abdomen pelvis: No acute or significant finding. Aortic Atherosclerosis (ICD10-I70.0). Diverticulosis without evidence of diverticulitis. Degenerative changes throughout the spine without evidence of fracture or focal lesion. Electronically Signed   By: Nelson Chimes M.D.   On: 04/11/2021 15:03   ASSESSMENT AND PLAN:   Amber [name] is a 87 y.o. female with medical history significant for CAD, COPD, OSA, history of insulin-dependent diabetes mellitus, hyperlipidemia, anxiety, seizure disorder, atrial fibrillation, TIA, who presents to the emergency department for chief concerns of altered mental status.   Altered mental status/acute metabolic encephalopathy to acute respiratory failure with hypoxia in the setting of congestive heart failure combined diastolic systolic acute on chronic -- patient came in with shortness of breath and confusion. Her sats were in the upper 80s. -- patient's mentation much improved answered question approved early-- BNP more than 3000. Will continue IV Lasix daily the-- overnight pulse oximetry  and assess for home oxygen use -- will resume cardiac meds once med rec has been completed -- patient follows with [facility]ology at [facility] Dr. Nehemiah Massed -- monitor input output, creatinine. -- Patient was seen by neurology--repeat CT head neg -EEG neg --Abg on 10/8 --nothing acute  One out of two positive blood culture history of pacemaker -- patient received one dose of vancomycin -- discussed with ID pharmacist will repeat blood culture. Continue to follow if remains negative discontinue antibiotic --10/8-- repeat BC negative  type II diabetes, hyperglycemia Hyperlipidemia -- resume home dose  insulin and sliding scale -- continue pravastatin  history of seizure disorder -- on Keppra--low dose  history of a fib -- on Coreg and eliquis   obstructive sleep apnea -- CPAP acute nightly  anxiety chronic -- PRN Xanax--- holding it today     Procedures: Family communication : daughter-in-law [name] Consults : CODE STATUS: DNR according to daughter-in-law who is HCP OA DVT Prophylaxis : eliquis Level of care: Med-Surg Status is: Inpatient    Dispo: The patient is from: Home              Anticipated d/c is to: Home              Patient currently is not medically stable to d/c.   Difficult to place patient No        TOTAL TIME TAKING CARE OF THIS PATIENT: 25 minutes.  >50% time spent on counselling and coordination of care  Note: This dictation was prepared with Dragon dictation along with smaller phrase technology. Any transcriptional errors that result from this process are unintentional.  Fritzi Mandes M.D    [facility]   CC: Primary care physician; Idelle Crouch, MD Patient ID: Amber [name], female   DOB: 26-Mar-1930, 87 y.o.   MRN: [id number]

## 2021-04-13 NOTE — Procedures (Addendum)
Patient Name: Amber [name]  MRN: [id number]  Epilepsy Attending: Lora Havens  Referring Physician/Provider: Dr Rupert Stacks Date: 04/13/2021 Duration: 25.46 mins  Patient history: 87yo F with ams. EEG to evaluate for seizure  Level of alertness: Awake  AEDs during EEG study: None  Technical aspects: This EEG study was done with scalp electrodes positioned according to the 10-20 International system of electrode placement. Electrical activity was acquired at a sampling rate of 500Hz  and reviewed with a high frequency filter of 70Hz  and a low frequency filter of 1Hz . EEG data were recorded continuously and digitally stored.   Description: No posterior dominant rhythm was seen. EEG showed continuous generalized 3 to 6 Hz theta-delta slowing. Generalized triphasic waves were also noted intermittently.  Photic driving was not seen during photic stimulation.  Hyperventilation was not performed.     ABNORMALITY - Continuous slow, generalized - Triphasic waves, generalized  IMPRESSION: This study is suggestive of moderate diffuse encephalopathy, nonspecific etiology but likely related to toxic-metabolic causes. No seizures or definite epileptiform discharges were seen throughout the recording.  Of note, report was delayed as eeg was saved locally and not available for review.   Kahron Kauth Barbra Sarks

## 2021-04-13 NOTE — Progress Notes (Signed)
   04/13/21 0930  Provider Notification  Provider Name/Title Fritzi Mandes, MD  Date Provider Notified 04/13/21  Time Provider Notified 0930  Notification Type Page  Notification Reason Other (Comment) (Patient is lethargic and unable to stay awake to take PO medications.  Asked MD if we could switch route of Keppra.  See orders.)  Provider response At bedside  Date of Provider Response 04/13/21  Time of Provider Response [phone]

## 2021-04-13 NOTE — Progress Notes (Signed)
Patient has been lethargic most of the day.  At approx 1200 she is more alert asking how she got here and if her family is aware she is here.  I was able to get her to eat approx 5 bites of pureed lunch and 3/4 of Nepro.

## 2021-04-13 NOTE — Progress Notes (Signed)
Pharmacy Antibiotic Note  Amber [name] is a 87 y.o. female admitted on 04/11/2021 with possible bacteremia.  Pharmacy has been consulted for Vancomycin  dosing.  -only 1 set Bcx drawn and BCID=poss MRSE.  May be contaminant -Repeat cultures showing no growth so far -pt does have a pacemaker  Plan: Change vancomycin to 750 mg IV Q 48 hrs.  Goal AUC 400-550 Expected AUC: 475 SCr used: 1.36    Height: 4\' 11"  (149.9 cm) Weight: 76.6 kg (168 lb 14 oz) IBW/kg (Calculated) : 43.2  Temp (24hrs), Avg:98.5 F (36.9 C), Min:97.7 F (36.5 C), Max:99.4 F (37.4 C)  Recent Labs  Lab 04/11/21 1230 04/12/21 0540 04/12/21 2345 04/13/21 0444  WBC 9.5 10.7*  --   --   CREATININE 0.72 0.95 1.26* 1.36*  LATICACIDVEN 1.8  --   --   --      Estimated Creatinine Clearance: 25.5 mL/min (A) (by C-G formula based on SCr of 1.36 mg/dL (H)).    Allergies  Allergen Reactions   Ambien [Zolpidem Tartrate] Other (See Comments)    Reaction:  Keeps pt awake    Penicillins Itching and Other (See Comments)    Has patient had a PCN reaction causing immediate rash, facial/tongue/throat swelling, SOB or lightheadedness with hypotension: No Has patient had a PCN reaction causing severe rash involving mucus membranes or skin necrosis: No Has patient had a PCN reaction that required hospitalization No Has patient had a PCN reaction occurring within the last 10 years: No If all of the above answers are "NO", then may proceed with Cephalosporin use.   Iodine Itching   Succinylcholine Other (See Comments)    Reaction:  Unknown    Etodolac Other (See Comments)    GI upset   Nsaids Other (See Comments)    Reaction: gi upset    Antimicrobials this admission: Vanc 10/7   >>     Microbiology results: 10/6 Bcx (1 set drawn): MRSE 10/7 Bcx (2 sets drawn): no growth pending  Thank you for allowing pharmacy to be a part of this patient's care.  Tawnya Crook, PharmD, BCPS Clinical  Pharmacist 04/13/2021 10:53 AM

## 2021-04-13 NOTE — Progress Notes (Signed)
Patient is lethargic.  Re-position in bed.  Patient is favoring her left side. When this nurse tried to re-position her neck better with rolled towels.  Patient is alert enough to say "oh my neck. It hurts."

## 2021-04-13 NOTE — Progress Notes (Signed)
SLP Cancellation Note  Patient Details Name: Amber [name] MRN: [id number] DOB: 30-Jun-1930   Cancelled treatment:       Reason Eval/Treat Not Completed: Fatigue/lethargy limiting ability to participate  Pt was not able to arouse for safe PO intake even with maximal tactile stimulation. Pt;'s nurse provides that pt received Xanax last evening for a test. Pt consumed <1 sip of nectar thick orange juice with nursing.   Evagelia Knack B. Rutherford Nail M.S., CCC-SLP, [place] [facility] [phone]    Stormy Fabian 04/13/2021, 10:43 AM

## 2021-04-13 NOTE — Progress Notes (Signed)
   04/12/21 2225  Assess: MEWS Score  Temp 99.4 F (37.4 C)  BP (!) 96/53  Pulse Rate (!) 56  Resp 18  SpO2 93 %  O2 Device Nasal Cannula  Assess: MEWS Score  MEWS Temp 0  MEWS Systolic 1  MEWS Pulse 0  MEWS RR 0  MEWS LOC 1  MEWS Score 2  MEWS Score Color Yellow  Assess: if the MEWS score is Yellow or Red  Were vital signs taken at a resting state? Yes  Focused Assessment No change from prior assessment  Does the patient meet 2 or more of the SIRS criteria? No  MEWS guidelines implemented *See Row Information* Yes  Treat  MEWS Interventions Escalated (See documentation below)  Pain Scale Faces  Pain Score 0  Take Vital Signs  Increase Vital Sign Frequency  Yellow: Q 2hr X 2 then Q 4hr X 2, if remains yellow, continue Q 4hrs  Escalate  MEWS: Escalate Yellow: discuss with charge nurse/RN and consider discussing with provider and RRT  Notify: Charge Nurse/RN  Name of Charge Nurse/RN Notified [place], RN  Date Charge Nurse/RN Notified 04/12/21  Time Charge Nurse/RN Notified 2123  Notify: Provider  Provider Name/Title Sharion Settler, FNP  Date Provider Notified 04/12/21  Time Provider Notified 2258  Notification Type Page  Notification Reason Other (Comment)  Provider response See new orders  Date of Provider Response 04/12/21  Time of Provider Response 2339  Assess: SIRS CRITERIA  SIRS Temperature  0  SIRS Pulse 0  SIRS Respirations  0  SIRS WBC 0  SIRS Score Sum  0

## 2021-04-13 NOTE — Plan of Care (Signed)

## 2021-04-13 NOTE — Progress Notes (Addendum)
Neurology Progress Note   S:// Seen and examined.  Was somewhat hypotensive and bradycardic overnight.  Also supplemental oxygen and gets desaturated without it.   O:// Current vital signs: BP (!) 123/51   Pulse 62   Temp 98.3 F (36.8 C) (Axillary)   Resp 18   Ht 4\' 11"  (1.499 m)   Wt 76.6 kg   SpO2 97%   BMI 34.11 kg/m  Vital signs in last 24 hours: Temp:  [98 F (36.7 C)-99.4 F (37.4 C)] 98.3 F (36.8 C) (10/08 0650) Pulse Rate:  [56-62] 62 (10/08 0650) Resp:  [16-22] 18 (10/08 0650) BP: (80-123)/(49-75) 123/51 (10/08 0650) SpO2:  [93 %-98 %] 97 % (10/08 0650) General: Drowsy, awakens to voice HEENT: Normocephalic/atraumatic Lungs: Scattered rales CVs: Regular rhythm Abdomen nondistended nontender Extremities with 1+ pedal edema. Neurological exam Drowsy, opens eyes to voice Follows some commands exam speech is moderately dysarthric Poor attention concentration Cranial nerve examination: Pupils equal round react light, extraocular movements intact, visual fields appear full to threat, face appears generally bilaterally weak but no focal asymmetry. Motor examination with moving all 4 extremities to command but diffusely weak to go against gravity. Sensation intact to touch Coordination: Unable to perform the exam  Medications  Current Facility-Administered Medications:    acetaminophen (TYLENOL) tablet 650 mg, 650 mg, Oral, Q6H PRN **OR** acetaminophen (TYLENOL) suppository 650 mg, 650 mg, Rectal, Q6H PRN, Cox, Amy N, DO   ALPRAZolam (XANAX) tablet 1 mg, 1 mg, Oral, Daily PRN, Cox, Amy N, DO, 1 mg at 04/12/21 1341   carvedilol (COREG) tablet 6.25 mg, 6.25 mg, Oral, BID WC, Fritzi Mandes, MD   feeding supplement (NEPRO CARB STEADY) liquid 237 mL, 237 mL, Oral, BID BM, Fritzi Mandes, MD   furosemide (LASIX) injection 40 mg, 40 mg, Intravenous, Daily, Fritzi Mandes, MD, 40 mg at 04/12/21 1615   insulin aspart (novoLOG) injection 0-15 Units, 0-15 Units, Subcutaneous, TID  WC, Cox, Amy N, DO   insulin aspart (novoLOG) injection 0-5 Units, 0-5 Units, Subcutaneous, QHS, Cox, Amy N, DO   insulin glargine-yfgn (SEMGLEE) injection 22 Units, 22 Units, Subcutaneous, Daily, Cox, Amy N, DO   ipratropium-albuterol (DUONEB) 0.5-2.5 (3) MG/3ML nebulizer solution 3 mL, 3 mL, Nebulization, Q4H PRN, Sharion Settler, NP   levETIRAcetam (KEPPRA) tablet 500 mg, 500 mg, Oral, BID, Cox, Amy N, DO, 500 mg at 04/12/21 1025   ondansetron (ZOFRAN) tablet 4 mg, 4 mg, Oral, Q6H PRN **OR** ondansetron (ZOFRAN) injection 4 mg, 4 mg, Intravenous, Q6H PRN, Cox, Amy N, DO   pantoprazole (PROTONIX) EC tablet 80 mg, 80 mg, Oral, Daily, Cox, Amy N, DO, 80 mg at 04/12/21 1025   pravastatin (PRAVACHOL) tablet 40 mg, 40 mg, Oral, QHS, Cox, Amy N, DO, 40 mg at 04/11/21 2212   [START ON 04/14/2021] vancomycin (VANCOCIN) IVPB 1000 mg/200 mL premix, 1,000 mg, Intravenous, Q48H, Merrill, Kristin A, RPH   vitamin B-12 (CYANOCOBALAMIN) tablet 1,000 mcg, 1,000 mcg, Oral, Daily, Fritzi Mandes, MD Labs CBC    Component Value Date/Time   WBC 10.7 (H) 04/12/2021 0540   RBC 4.01 04/12/2021 0540   HGB 10.8 (L) 04/12/2021 0540   HCT 32.9 (L) 04/12/2021 0540   PLT 290 04/12/2021 0540   MCV 82.0 04/12/2021 0540   MCH 26.9 04/12/2021 0540   MCHC 32.8 04/12/2021 0540   RDW 17.2 (H) 04/12/2021 0540   LYMPHSABS 1.0 04/11/2021 1230   MONOABS 1.0 04/11/2021 1230   EOSABS 0.0 04/11/2021 1230   BASOSABS 0.0 04/11/2021 1230  CMP     Component Value Date/Time   NA 131 (L) 04/12/2021 2345   NA 140 04/08/2017 1444   K 3.5 04/12/2021 2345   CL 93 (L) 04/12/2021 2345   CO2 28 04/12/2021 2345   GLUCOSE 96 04/12/2021 2345   BUN 28 (H) 04/12/2021 2345   BUN 20 04/08/2017 1444   CREATININE 1.36 (H) 04/13/2021 0444   CALCIUM 8.5 (L) 04/12/2021 2345   PROT 7.4 04/11/2021 1230   PROT 7.2 04/08/2017 1444   ALBUMIN 3.8 04/11/2021 1230   ALBUMIN 4.2 04/08/2017 1444   AST 45 (H) 04/11/2021 1230   ALT 21 04/11/2021  1230   ALKPHOS 75 04/11/2021 1230   BILITOT 1.4 (H) 04/11/2021 1230   BILITOT 0.6 04/08/2017 1444   GFRNONAA 37 (L) 04/13/2021 0444   GFRAA >60 02/26/2020 0536    glycosylated hemoglobin  Lipid Panel     Component Value Date/Time   CHOL 164 12/07/2019 0437   TRIG 68 12/07/2019 0437   HDL 43 12/07/2019 0437   CHOLHDL 3.8 12/07/2019 0437   VLDL 14 12/07/2019 0437   LDLCALC 107 (H) 12/07/2019 0437     Imaging I have reviewed images in epic and the results pertinent to this consultation are: Initial CT scan with no acute changes.  Repeat CT scan around 3 PM yesterday also with no new changes.  Assessment: 87 year old woman with altered mental status in the setting of shortness of breath and possible CHF exacerbation.  Low suspicion for this being a stroke.  2 CTs about 24 hours apart negative for acute process. Remains encephalopathic. 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 Also on Xanax 1mg  nightly. Maybe contributing to poor mentation. Also, had O2 desaturations overnight- might be worthwhile checking blood gas. An EEG would be helpful-it has been done read is pending. Most likely toxic metabolic encephalopathy in the setting of acute illness  Recommendations: Management of acute medical issues per primary team as you are. EEG read pending-I will follow with you. She is also on Xanax 1 mg nightly for sleep.  I have reduced that to 0.5 mg given that she is elderly hand this is a high dose of the medication, which might contribute to more encephalopathy.   -- Amie Portland, MD Neurologist [facility] Pager: [phone]  ADDENDUM EEG with no sz. Remains very drowsy. In addtion to decreasing Xanax, will lower Keppra and use IV formulation for now. Check blood gas Discussed with Dr. Posey Pronto  -- Amie Portland,  MD Neurologist [facility] Pager: [phone]

## 2021-04-13 NOTE — Plan of Care (Signed)
Safety maintained. Lethargic during the night. Arouses to verbal stimuli, but drifts off to sleep immediately. Sharion Settler, FNP  notified and states it is okay to hold patient's PO meds. Rachael Fee, FNP also notified of patient's yellow mews score.  Potassium replaced. CPAP worn until patient removed it. Patient is now on 3 L O2 via  and is sating well.  Problem: Education: Goal: Knowledge of General Education information will improve Description: Including pain rating scale, medication(s)/side effects and non-pharmacologic comfort measures Outcome: Progressing   Problem: Health Behavior/Discharge Planning: Goal: Ability to manage health-related needs will improve Outcome: Progressing   Problem: Clinical Measurements: Goal: Ability to maintain clinical measurements within normal limits will improve Outcome: Progressing Goal: Will remain free from infection Outcome: Progressing Goal: Diagnostic test results will improve Outcome: Progressing Goal: Respiratory complications will improve Outcome: Progressing Goal: Cardiovascular complication will be avoided Outcome: Progressing   Problem: Activity: Goal: Risk for activity intolerance will decrease Outcome: Progressing   Problem: Nutrition: Goal: Adequate nutrition will be maintained Outcome: Progressing   Problem: Coping: Goal: Level of anxiety will decrease Outcome: Progressing   Problem: Elimination: Goal: Will not experience complications related to bowel motility Outcome: Progressing Goal: Will not experience complications related to urinary retention Outcome: Progressing   Problem: Pain Managment: Goal: General experience of comfort will improve Outcome: Progressing   Problem: Safety: Goal: Ability to remain free from injury will improve Outcome: Progressing   Problem: Skin Integrity: Goal: Risk for impaired skin integrity will decrease Outcome: Progressing

## 2021-04-14 LAB — GLUCOSE, CAPILLARY
Glucose-Capillary: 159 mg/dL — ABNORMAL HIGH (ref 70–99)
Glucose-Capillary: 240 mg/dL — ABNORMAL HIGH (ref 70–99)
Glucose-Capillary: 172 mg/dL — ABNORMAL HIGH (ref 70–99)
Glucose-Capillary: 154 mg/dL — ABNORMAL HIGH (ref 70–99)

## 2021-04-14 LAB — CULTURE, BLOOD (ROUTINE X 2): Special Requests: ADEQUATE

## 2021-04-14 LAB — CREATININE, SERUM
Creatinine, Ser: 0.76 mg/dL (ref 0.44–1.00)
GFR, Estimated: >60 mL/min (ref >60)

## 2021-04-14 MED ORDER — LEVETIRACETAM 250 MG PO TABS
250.0000 mg | ORAL_TABLET | Freq: Two times a day (BID) | ORAL | Status: DC
  Administered 2021-04-14 – 2021-04-16 (×5): 250 mg via ORAL
  Filled 2021-04-14 (×6): qty 1

## 2021-04-14 MED ORDER — LEVETIRACETAM 250 MG PO TABS
250.0000 mg | ORAL_TABLET | Freq: Two times a day (BID) | ORAL | Status: DC
  Filled 2021-04-14 (×2): qty 1

## 2021-04-14 MED ORDER — FUROSEMIDE 20 MG PO TABS
20.0000 mg | ORAL_TABLET | Freq: Two times a day (BID) | ORAL | Status: DC
  Administered 2021-04-14 – 2021-04-16 (×4): 20 mg via ORAL
  Filled 2021-04-14 (×4): qty 1

## 2021-04-14 MED ORDER — LEVETIRACETAM 100 MG/ML PO SOLN
250.0000 mg | Freq: Two times a day (BID) | ORAL | Status: DC
  Filled 2021-04-14 (×2): qty 2.5

## 2021-04-14 NOTE — Evaluation (Signed)
Physical Therapy Evaluation Patient Details Name: Amber [name] MRN: [id number] DOB: October 15, 1929 Today's Date: 04/14/2021  History of Present Illness  Patient is a 87 year old woman with altered mental status in the setting of shortness of breath and possible CHF exacerbation. 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 Toxic metabolic encephalopathy. CT of head repors no acute intracranial findings or mass lesions   Clinical Impression  Patient agreeable to PT. Patient reports she lives with her family and is primarily in a wheelchair at baseline but did not require assistance for transfers and bed mobility.  Patient currently needs maximal assistance for bed mobility and standing from bed. Limited standing tolerance of about 15 seconds with significant assistance required to maintain standing balance. Patient does not appear to be at her baseline level of functional mobility. Recommend SNF placement for ongoing PT efforts to maximize independence and facilitate return to prior level of function.      Recommendations for follow up therapy are one component of a multi-disciplinary discharge planning process, led by the attending physician.  Recommendations may be updated based on patient status, additional functional criteria and insurance authorization.  Follow Up Recommendations SNF    Equipment Recommendations  Other (comment) (to be determined at next level of care)    Recommendations for Other Services       Precautions / Restrictions Precautions Precautions: Fall Restrictions Weight Bearing Restrictions: No      Mobility  Bed Mobility Overal bed mobility: Needs Assistance Bed Mobility: Supine to Sit;Sit to Supine     Supine to sit: Max assist;HOB elevated Sit to supine: Max assist   General bed mobility comments: verbal cues for  sequencing and technique.    Transfers Overall transfer level: Needs assistance   Transfers: Sit to/from Stand;Lateral/Scoot Transfers Sit to Stand: Max assist        Lateral/Scoot Transfers: Mod assist General transfer comment: significant lifting assistance required for standing from bed. activity tolerance limited by fatigue. patient performed incremental scooting transfer towards left side with moderate assistance and cues for technique  Ambulation/Gait             General Gait Details: unable to stand long enough for ambulation efforts at this time  Stairs            Wheelchair Mobility    Modified Rankin (Stroke Patients Only)       Balance Overall balance assessment: Needs assistance Sitting-balance support: Feet supported Sitting balance-Leahy Scale: Fair Sitting balance - Comments: with feet supported on floor, Min guard assistance for safety progressing to close stand by assistance with increased sitting time Postural control:  (patient has head/neck turned to the left and unable to reach past midline (patient reports headache with active neck ROM)) Standing balance support: Single extremity supported Standing balance-Leahy Scale: Zero Standing balance comment: Max A required to maintain standing balance with posterior lean. standing tolerance less than 15 seconds                             Pertinent Vitals/Pain Pain Assessment: Faces Faces Pain Scale: Hurts a little bit Pain Location: headache Pain Descriptors / Indicators: Discomfort Pain Intervention(s): Limited activity within patient's tolerance    Home Living Family/patient expects to be discharged to:: Private residence Living Arrangements: Children Available Help at Discharge: Family;Available 24 hours/day Type of  Home: House Home Access: [place]: One level Home Equipment: [place] - 4 wheels;Wheelchair - manual Additional Comments: information is  per patient report    Prior Function Level of Independence: Needs assistance   Gait / Transfers Assistance Needed: patient reports she can ambulate short distance with a walker and then reports she is primarily in the wheelchair. she reports no assistance required for pivot to wheelchair and bed mobility independently  ADL's / Homemaking Assistance Needed: independent per patient report  Comments: patient states she was getting home health PT/OT/SLP     Hand Dominance        Extremity/Trunk Assessment   Upper Extremity Assessment Upper Extremity Assessment: Generalized weakness;RUE deficits/detail RUE Deficits / Details: patient with limited active shoulder ROM (she states she has chronic arthritis problems). RUE Sensation:  (patient self reports decreased light touch sensation compared to left)    Lower Extremity Assessment Lower Extremity Assessment: Generalized weakness       Communication   Communication: No difficulties  Cognition Arousal/Alertness: Awake/alert Behavior During Therapy: WFL for tasks assessed/performed Overall Cognitive Status: No family/caregiver present to determine baseline cognitive functioning                                 General Comments: patient is orinted to person, place, day of the week. unable to state the year and states she is 87 years old. patient is able to follow single step commands with extra time      General Comments      Exercises     Assessment/Plan    PT Assessment Patient needs continued PT services  PT Problem List Decreased strength;Decreased range of motion;Decreased activity tolerance;Decreased balance;Decreased mobility;Decreased safety awareness;Decreased knowledge of use of DME       PT Treatment Interventions DME instruction;Stair training;Gait training;Therapeutic exercise;Functional mobility training;Therapeutic activities;Neuromuscular re-education;Balance training;Patient/family  education;Wheelchair mobility training    PT Goals (Current goals can be found in the Care Plan section)  Acute Rehab PT Goals Patient Stated Goal: to go home PT Goal Formulation: With patient Time For Goal Achievement: 04/28/21 Potential to Achieve Goals: Good    Frequency Min 2X/week   Barriers to discharge        Co-evaluation               AM-PAC PT "6 Clicks" Mobility  Outcome Measure Help needed turning from your back to your side while in a flat bed without using bedrails?: A Lot Help needed moving from lying on your back to sitting on the side of a flat bed without using bedrails?: A Lot Help needed moving to and from a bed to a chair (including a wheelchair)?: A Lot Help needed standing up from a chair using your arms (e.g., wheelchair or bedside chair)?: A Lot Help needed to walk in hospital room?: Total Help needed climbing 3-5 steps with a railing? : Total 6 Click Score: 10    End of Session Equipment Utilized During Treatment: Oxygen Activity Tolerance: Patient tolerated treatment well Patient left: in bed;with call bell/phone within reach;with bed alarm set Nurse Communication: Mobility status (also updated MD about d/c recommendations for SNF) PT Visit Diagnosis: Unsteadiness on feet (R26.81);Muscle weakness (generalized) (M62.81);Difficulty in walking, not elsewhere classified (R26.2)    Time: 7408-1448 PT Time Calculation (min) (ACUTE ONLY): 21 min   Charges:   PT Evaluation $PT Eval Low Complexity: 1 Low PT Treatments $  Therapeutic Activity: 8-22 mins        Minna Merritts, PT, MPT   Percell Locus 04/14/2021, 12:53 PM

## 2021-04-14 NOTE — NC FL2 (Signed)
[place] LEVEL OF CARE SCREENING TOOL     IDENTIFICATION  Patient Name: Amber [name] Birthdate: 25-May-1930 Sex: female Admission Date (Current Location): 04/11/2021  [facility] and [place] Number:  Engineering geologist and Address:  [facility], [address] [place], [place] [address]      Provider Number: [id number]  Attending Physician Name and Address:  Fritzi Mandes, MD  Relative Name and Phone Number:       Current Level of Care: Hospital Recommended Level of Care: [facility] Prior Approval Number:    Date Approved/Denied:   PASRR Number: [id number]  Discharge Plan: SNF    Current Diagnoses: Patient Active Problem List   Diagnosis Date Noted   AMS (altered mental status) 04/12/2021   Shortness of breath 04/11/2021   Altered mental status 04/11/2021   History of atrial fibrillation 02/25/2020   Pacemaker 02/25/2020   Acute on chronic diastolic CHF (congestive heart failure) ([place]) 02/25/2020   Complete heart block ([place]) 01/24/2020   Injury    S/P ORIF (open reduction internal fixation) fracture    Hypotension due to hypovolemia    Closed displaced fracture of right tibial tuberosity    Pain    Syncope and collapse 12/06/2019   Status post total hip replacement, left 02/09/2018   Primary osteoarthritis of left hip 01/06/2018   Chronic pain syndrome 04/09/2017   Other specified health status 04/08/2017   Disorder of bone, unspecified 04/08/2017   Long term current use of opiate analgesic 04/08/2017   Chronic low back pain (Primary Area of Pain) (Bilateral) (L>R) 04/08/2017   Chronic pain of both lower extremities  (Tertiary Area of Pain) 04/08/2017   Chronic groin pain (Secondary Area of Pain) 04/08/2017   Chronic neck pain (Fourth Area of Pain) (Bilateral) (L>R) 04/08/2017   Chronic sacroiliac joint pain 04/08/2017   Other long term (current) drug therapy 04/08/2017   Syncope 02/09/2017    Asthmatic bronchitis 01/26/2017   Fall 10/06/2016   Anxiety 08/08/2016   Acute on chronic respiratory failure with hypoxia ([place]) 07/26/2016   Acute on chronic systolic CHF (congestive heart failure) ([place]) 07/26/2016   TIA (transient ischemic attack) 07/25/2015   Sick sinus syndrome ([place]) 03/14/2015   CAD (coronary artery disease) 02/23/2015   Chronic heart failure ([place]) 01/05/2015   Fracture dislocation of ankle 07/27/2014   IDDM (insulin dependent diabetes mellitus) 07/27/2014   Essential hypertension 07/27/2014   Multiple rib fractures 07/27/2014   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 Acute CPAP. Last wore 10/7.) Continent Weight: 168 lb 14 oz (76.6 kg) Height:  4\' 11"  (149.9 cm)  BEHAVIORAL SYMPTOMS/MOOD NEUROLOGICAL BOWEL NUTRITION STATUS   (None)  (None) Continent Diet (DYS 1. Fluid nectar thick. Extra Gravies on meats, potatoes. Yogurt and pudding. Magic Cup.   NO Straws!!)  AMBULATORY STATUS COMMUNICATION OF NEEDS Skin   Extensive Assist Verbally Normal                       Personal Care Assistance Level of Assistance  Bathing, Feeding, Dressing Bathing Assistance: Maximum assistance Feeding assistance: Limited assistance Dressing Assistance: Maximum assistance     Functional Limitations Info  Sight, Hearing, Speech Sight Info: Adequate Hearing Info:  Adequate Speech Info: Adequate    SPECIAL CARE FACTORS FREQUENCY  PT (By licensed PT), OT (By licensed OT)     PT Frequency: 5 x week OT Frequency: 5 x week            Contractures Contractures Info: Not present    Additional Factors Info  Code Status, Allergies Code Status Info: DNR Allergies Info: Ambien (Zolpidem Tartrate), Penicillins, Iodine, Succinylcholine,  Etodolac, Nsaids           Current Medications (04/14/2021):  This is the current hospital active medication list Current Facility-Administered Medications  Medication Dose Route Frequency Provider Last Rate Last Admin   acetaminophen (TYLENOL) tablet 650 mg  650 mg Oral Q6H PRN Cox, Amy N, DO   650 mg at 04/13/21 2254   Or   acetaminophen (TYLENOL) suppository 650 mg  650 mg Rectal Q6H PRN Cox, Amy N, DO       apixaban (ELIQUIS) tablet 5 mg  5 mg Oral BID Fritzi Mandes, MD   5 mg at 04/14/21 0857   carvedilol (COREG) tablet 6.25 mg  6.25 mg Oral BID WC Fritzi Mandes, MD   6.25 mg at 04/14/21 0857   feeding supplement (NEPRO CARB STEADY) liquid 237 mL  237 mL Oral BID BM Fritzi Mandes, MD   237 mL at 04/14/21 0857   furosemide (LASIX) tablet 20 mg  20 mg Oral BID Fritzi Mandes, MD       insulin aspart (novoLOG) injection 0-15 Units  0-15 Units Subcutaneous TID WC Cox, Amy N, DO   5 Units at 04/14/21 1211   insulin aspart (novoLOG) injection 0-5 Units  0-5 Units Subcutaneous QHS Cox, Amy N, DO   2 Units at 04/13/21 2104   ipratropium-albuterol (DUONEB) 0.5-2.5 (3) MG/3ML nebulizer solution 3 mL  3 mL Nebulization Q4H PRN Sharion Settler, NP       levETIRAcetam (KEPPRA) tablet 250 mg  250 mg Oral BID Lu Duffel, RPH   250 mg at 04/14/21 1244   ondansetron (ZOFRAN) tablet 4 mg  4 mg Oral Q6H PRN Cox, Amy N, DO       Or   ondansetron (ZOFRAN) injection 4 mg  4 mg Intravenous Q6H PRN Cox, Amy N, DO       pantoprazole (PROTONIX) EC tablet 80 mg  80 mg Oral Daily Cox, Amy N, DO   80 mg at 04/12/21 1025   pravastatin (PRAVACHOL) tablet 40 mg  40 mg Oral QHS Cox, Amy N, DO   40 mg at 04/13/21 2104   vitamin B-12 (CYANOCOBALAMIN) tablet 1,000 mcg  1,000 mcg Oral Daily Fritzi Mandes, MD   1,000 mcg at 04/14/21 6294     Discharge Medications: Please see discharge summary for a list of discharge medications.  Relevant Imaging Results:  Relevant Lab Results:   Additional Information SS#:  [id number]. Has had two COVID vaccines, no boosters.  Candie Chroman, LCSW

## 2021-04-14 NOTE — TOC Initial Note (Signed)
Transition of Care [facility]) - Initial/Assessment Note    Patient Details  Name: Amber [name] MRN: [id number] Date of Birth: 29-Mar-1930  Transition of [facility]) CM/SW Contact:    Candie Chroman, LCSW Phone Number: 04/14/2021, 1:29 PM  Clinical Narrative:   Patient not fully oriented. CSW called patient's daughter, Tram Wrenn, introduced role, and explained that PT recommendations would be discussed. Daughter is agreeable to SNF placement. Preferences are [facility] and [facility]. Patient has had two COVID vaccines, no boosters. No further concerns. CSW encouraged patient's daughter to contact CSW as needed. CSW will continue to follow patient and her daughter for support and facilitate discharge to SNF once medically stable.               Expected Discharge Plan: Skilled Nursing Facility Barriers to Discharge: Continued Medical Work up   Patient Goals and CMS Choice        Expected Discharge Plan and Services Expected Discharge Plan: [place] Acute Care Choice: [place] arrangements for the past 2 months: Single Family Home                                      Prior Living Arrangements/Services Living arrangements for the past 2 months: Single Family Home Lives with:: Adult Children Patient language and need for interpreter reviewed:: Yes Do you feel safe going back to the place where you live?: Yes      Need for Family Participation in Patient Care: Yes (Comment) Care giver support system in place?: Yes (comment) Current home services: Home OT, Home PT Criminal Activity/Legal Involvement Pertinent to Current Situation/Hospitalization: No - Comment as needed  Activities of Daily Living Home Assistive Devices/Equipment: Other (Comment) (UTA) ADL Screening (condition at time of admission) Patient's cognitive ability adequate to safely complete daily activities?: No Is the patient deaf or have difficulty  hearing?: No Does the patient have difficulty seeing, even when wearing glasses/contacts?: No Does the patient have difficulty concentrating, remembering, or making decisions?: Yes Patient able to express need for assistance with ADLs?: Yes Does the patient have difficulty dressing or bathing?: Yes Independently performs ADLs?: No Communication: Needs assistance Is this a change from baseline?: Change from baseline, expected to last <3 days Dressing (OT): Needs assistance Is this a change from baseline?: Pre-admission baseline Grooming: Needs assistance Is this a change from baseline?: Pre-admission baseline Feeding: Needs assistance Is this a change from baseline?: Pre-admission baseline Bathing: Needs assistance Is this a change from baseline?: Pre-admission baseline Toileting: Needs assistance Is this a change from baseline?: Pre-admission baseline In/Out Bed: Needs assistance Is this a change from baseline?: Pre-admission baseline Walks in Home: Needs assistance Is this a change from baseline?: Pre-admission baseline Does the patient have difficulty walking or climbing stairs?: Yes Weakness of Legs: None Weakness of Arms/Hands: None  Permission Sought/Granted Permission sought to share information with : Facility Sport and exercise psychologist, Family Supports    Share Information with NAME: Dewanna Hurston  Permission granted to share info w AGENCY: SNF's  Permission granted to share info w Relationship: Daughter  Permission granted to share info w Contact Information: [phone]  Emotional Assessment Appearance:: Appears stated age Attitude/Demeanor/Rapport: Unable to Assess Affect (typically observed): Unable to Assess Orientation: : Oriented to Self, Oriented to Place Alcohol / Substance Use: Not Applicable Psych Involvement: No (comment)  Admission diagnosis:  Shortness of  breath [R06.02] Confusion [R41.0] SOB (shortness of breath) [R06.02] Neurocognitive deficits  [R29.818, R41.89] Acute respiratory failure with hypoxia ([place]) [J96.01] AMS (altered mental status) [R41.82] Patient Active Problem List   Diagnosis Date Noted   AMS (altered mental status) 04/12/2021   Shortness of breath 04/11/2021   Altered mental status 04/11/2021   History of atrial fibrillation 02/25/2020   Pacemaker 02/25/2020   Acute on chronic diastolic CHF (congestive heart failure) ([place]) 02/25/2020   Complete heart block ([place]) 01/24/2020   Injury    S/P ORIF (open reduction internal fixation) fracture    Hypotension due to hypovolemia    Closed displaced fracture of right tibial tuberosity    Pain    Syncope and collapse 12/06/2019   Status post total hip replacement, left 02/09/2018   Primary osteoarthritis of left hip 01/06/2018   Chronic pain syndrome 04/09/2017   Other specified health status 04/08/2017   Disorder of bone, unspecified 04/08/2017   Long term current use of opiate analgesic 04/08/2017   Chronic low back pain (Primary Area of Pain) (Bilateral) (L>R) 04/08/2017   Chronic pain of both lower extremities  (Tertiary Area of Pain) 04/08/2017   Chronic groin pain (Secondary Area of Pain) 04/08/2017   Chronic neck pain (Fourth Area of Pain) (Bilateral) (L>R) 04/08/2017   Chronic sacroiliac joint pain 04/08/2017   Other long term (current) drug therapy 04/08/2017   Syncope 02/09/2017   Asthmatic bronchitis 01/26/2017   Fall 10/06/2016   Anxiety 08/08/2016   Acute on chronic respiratory failure with hypoxia ([place]) 07/26/2016   Acute on chronic systolic CHF (congestive heart failure) ([place]) 07/26/2016   TIA (transient ischemic attack) 07/25/2015   Sick sinus syndrome ([place]) 03/14/2015   CAD (coronary artery disease) 02/23/2015   Chronic heart failure ([place]) 01/05/2015   Fracture dislocation of ankle 07/27/2014   IDDM (insulin dependent diabetes mellitus) 07/27/2014   Essential hypertension 07/27/2014   Multiple rib fractures 07/27/2014   Mass of parotid gland  07/27/2014   Anemia 12/06/2013   Type 2 diabetes mellitus with diabetic polyneuropathy, with long-term current use of insulin ([place]) 12/06/2013   PCP:  Idelle Crouch, MD Pharmacy:   [facility] [address], [place] [address] Phone: [phone] Fax: [phone]     Social Determinants of Health (SDOH) Interventions    Readmission Risk Interventions No flowsheet data found.

## 2021-04-14 NOTE — Plan of Care (Signed)
No acute events during the night. Patient was much more alert tonight. Able to answer most orientation questions and take her po meds. She was able to verbalize her needs. PRN tylenol given for neck pain. Patient complains that it is difficult to turn her neck. Oxygen saturations maintained WNL on 3L O2 via [place].  Problem: Education: Goal: Knowledge of General Education information will improve Description: Including pain rating scale, medication(s)/side effects and non-pharmacologic comfort measures Outcome: Progressing   Problem: Health Behavior/Discharge Planning: Goal: Ability to manage health-related needs will improve Outcome: Progressing   Problem: Clinical Measurements: Goal: Ability to maintain clinical measurements within normal limits will improve Outcome: Progressing Goal: Will remain free from infection Outcome: Progressing Goal: Diagnostic test results will improve Outcome: Progressing Goal: Respiratory complications will improve Outcome: Progressing Goal: Cardiovascular complication will be avoided Outcome: Progressing   Problem: Activity: Goal: Risk for activity intolerance will decrease Outcome: Progressing   Problem: Nutrition: Goal: Adequate nutrition will be maintained Outcome: Progressing   Problem: Coping: Goal: Level of anxiety will decrease Outcome: Progressing   Problem: Elimination: Goal: Will not experience complications related to bowel motility Outcome: Progressing Goal: Will not experience complications related to urinary retention Outcome: Progressing   Problem: Pain Managment: Goal: General experience of comfort will improve Outcome: Progressing   Problem: Safety: Goal: Ability to remain free from injury will improve Outcome: Progressing   Problem: Skin Integrity: Goal: Risk for impaired skin integrity will decrease Outcome: Progressing

## 2021-04-14 NOTE — Progress Notes (Signed)
Neurology Progress Note   S:// Seen and examined.  Much more awake in the last 18 to 24 hours  O:// Current vital signs: BP (!) 157/67 (BP Location: Right Arm)   Pulse 63   Temp 98.2 F (36.8 C)   Resp 16   Ht 4\' 11"  (1.499 m)   Wt 76.6 kg   SpO2 100%   BMI 34.11 kg/m  Vital signs in last 24 hours: Temp:  [98 F (36.7 C)-98.5 F (36.9 C)] 98.2 F (36.8 C) (10/09 0802) Pulse Rate:  [59-63] 63 (10/09 0802) Resp:  [16-18] 16 (10/09 0802) BP: (130-164)/(55-67) 157/67 (10/09 0802) SpO2:  [93 %-100 %] 100 % (10/09 0802) General examination: Much more awake today, no acute distress HNT: Normocephalic atraumatic Lungs: Scattered rales Cardiovascular: Regular rhythm Extremities with trace pedal edema Neurological exam Awake, alert, oriented to the fact that she is in the hospital but told me that she is in [place] when she is actually in [place]. Got her age wrong-that she is 87 years old when she is 87 years old. Was able to name objects without difficulty Was able to repeat sentences without difficulty Was able to follow simple commands with diminished attention concentration Cranial nerves II to XII intact Motor examination with symmetric 4-4+/5 strength in all 4 extremities without gross focality. Sensation intact to light touch without extinction Coordination: With no obvious dysmetria  Medications  Current Facility-Administered Medications:    acetaminophen (TYLENOL) tablet 650 mg, 650 mg, Oral, Q6H PRN, 650 mg at 04/13/21 2254 **OR** acetaminophen (TYLENOL) suppository 650 mg, 650 mg, Rectal, Q6H PRN, Cox, Amy N, DO   apixaban (ELIQUIS) tablet 5 mg, 5 mg, Oral, BID, Fritzi Mandes, MD, 5 mg at 04/14/21 0857   carvedilol (COREG) tablet 6.25 mg, 6.25 mg, Oral, BID WC, Fritzi Mandes, MD, 6.25 mg at 04/14/21 0857   feeding supplement (NEPRO CARB STEADY) liquid 237 mL, 237 mL, Oral, BID BM, Fritzi Mandes, MD, 237 mL at 04/14/21 0857   furosemide (LASIX) injection 40 mg, 40  mg, Intravenous, Daily, Fritzi Mandes, MD, 40 mg at 04/14/21 0857   insulin aspart (novoLOG) injection 0-15 Units, 0-15 Units, Subcutaneous, TID WC, Cox, Amy N, DO, 3 Units at 04/14/21 0800   insulin aspart (novoLOG) injection 0-5 Units, 0-5 Units, Subcutaneous, QHS, Cox, Amy N, DO, 2 Units at 04/13/21 2104   ipratropium-albuterol (DUONEB) 0.5-2.5 (3) MG/3ML nebulizer solution 3 mL, 3 mL, Nebulization, Q4H PRN, Sharion Settler, NP   levETIRAcetam (KEPPRA) 250 mg in sodium chloride 0.9 % 100 mL IVPB, 250 mg, Intravenous, Q12H, Fritzi Mandes, MD, Last Rate: 410 mL/hr at 04/13/21 2249, 250 mg at 04/13/21 2249   ondansetron (ZOFRAN) tablet 4 mg, 4 mg, Oral, Q6H PRN **OR** ondansetron (ZOFRAN) injection 4 mg, 4 mg, Intravenous, Q6H PRN, Cox, Amy N, DO   pantoprazole (PROTONIX) EC tablet 80 mg, 80 mg, Oral, Daily, Cox, Amy N, DO, 80 mg at 04/12/21 1025   pravastatin (PRAVACHOL) tablet 40 mg, 40 mg, Oral, QHS, Cox, Amy N, DO, 40 mg at 04/13/21 2104   vitamin B-12 (CYANOCOBALAMIN) tablet 1,000 mcg, 1,000 mcg, Oral, Daily, Fritzi Mandes, MD, 1,000 mcg at 04/14/21 0857 Labs CBC    Component Value Date/Time   WBC 10.7 (H) 04/12/2021 0540   RBC 4.01 04/12/2021 0540   HGB 10.8 (L) 04/12/2021 0540   HCT 32.9 (L) 04/12/2021 0540   PLT 290 04/12/2021 0540   MCV 82.0 04/12/2021 0540   MCH 26.9 04/12/2021 0540   MCHC 32.8 04/12/2021  0540   RDW 17.2 (H) 04/12/2021 0540   LYMPHSABS 1.0 04/11/2021 1230   MONOABS 1.0 04/11/2021 1230   EOSABS 0.0 04/11/2021 1230   BASOSABS 0.0 04/11/2021 1230    CMP     Component Value Date/Time   NA 131 (L) 04/12/2021 2345   NA 140 04/08/2017 1444   K 3.5 04/12/2021 2345   CL 93 (L) 04/12/2021 2345   CO2 28 04/12/2021 2345   GLUCOSE 96 04/12/2021 2345   BUN 28 (H) 04/12/2021 2345   BUN 20 04/08/2017 1444   CREATININE 0.76 04/14/2021 0450   CALCIUM 8.5 (L) 04/12/2021 2345   PROT 7.4 04/11/2021 1230   PROT 7.2 04/08/2017 1444   ALBUMIN 3.8 04/11/2021 1230   ALBUMIN  4.2 04/08/2017 1444   AST 45 (H) 04/11/2021 1230   ALT 21 04/11/2021 1230   ALKPHOS 75 04/11/2021 1230   BILITOT 1.4 (H) 04/11/2021 1230   BILITOT 0.6 04/08/2017 1444   GFRNONAA >60 04/14/2021 0450   GFRAA >60 02/26/2020 0536   Blood gas yesterday was normal.  Imaging I have reviewed images in epic and the results pertinent to this consultation are: Initial CT scan on presentation with no acute changes.  Repeat CT scan around 3 PM 2 days ago also with no new changes.  Assessment: 87 year old woman with altered mental status in the setting of shortness of breath and possible CHF exacerbation.  Low suspicion for this being a stroke.  2 CTs about 24 hours apart negative for acute process. Remains encephalopathic. 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 Also on Xanax 1mg  nightly. Maybe contributing to poor mentation. Doing better with reduction in the dose of Xanax and Keppra. Medications might have been making her more drowsy in the setting of acute CHF exacerbation.  Impression: Toxic metabolic encephalopathy  Recommendations: Management of acute medical issues per primary team as you are. I would continue the low-dose of Keppra-250 mg twice daily-which was initially started at 500 twice daily for possible seizures because of tussive syncope like events with confusion/loss of memory for all events. I would also keep her on the lowest possible dose of Xanax that she was on 1 mg at night which I think is a big dose for the patient her age. Discussed plan with Dr. Posey Pronto  Inpatient neurology will be available as needed. Please call with questions.  -- Amie Portland, MD Neurologist [facility] Pager: [phone]

## 2021-04-14 NOTE — Progress Notes (Signed)
[facility] at [place] NAME: Amber [name]    MR#:  [id number]  DATE OF BIRTH:  April 08, 1930  SUBJECTIVE:  patient perked up today. Very talkative. No family in the room. Ate good breakfast according to RN. Alert and oriented to place and person REVIEW OF SYSTEMS:   Review of Systems  Unable to perform ROS: Mental status change  Constitutional:  Negative for chills, fever and weight loss.  HENT:  Negative for ear discharge, ear pain and nosebleeds.   Eyes:  Negative for blurred vision, pain and discharge.  Respiratory:  Negative for sputum production, shortness of breath, wheezing and stridor.   Cardiovascular:  Negative for chest pain, palpitations, orthopnea and PND.  Gastrointestinal:  Negative for abdominal pain, diarrhea, nausea and vomiting.  Genitourinary:  Negative for frequency and urgency.  Musculoskeletal:  Negative for back pain and joint pain.  Neurological:  Positive for weakness. Negative for sensory change, speech change and focal weakness.  Psychiatric/Behavioral:  Negative for depression and hallucinations. The patient is not nervous/anxious.   Tolerating Diet:yes Tolerating PT: SNF  DRUG ALLERGIES:   Allergies  Allergen Reactions   Ambien [Zolpidem Tartrate] Other (See Comments)    Reaction:  Keeps pt awake    Penicillins Itching and Other (See Comments)    Has patient had a PCN reaction causing immediate rash, facial/tongue/throat swelling, SOB or lightheadedness with hypotension: No Has patient had a PCN reaction causing severe rash involving mucus membranes or skin necrosis: No Has patient had a PCN reaction that required hospitalization No Has patient had a PCN reaction occurring within the last 10 years: No If all of the above answers are "NO", then may proceed with Cephalosporin use.   Iodine Itching   Succinylcholine Other (See Comments)    Reaction:  Unknown    Etodolac Other (See Comments)    GI  upset   Nsaids Other (See Comments)    Reaction: gi upset    VITALS:  Blood pressure 137/61, pulse 62, temperature 98.2 F (36.8 C), resp. rate 18, height 4\' 11"  (1.499 m), weight 76.6 kg, SpO2 98 %.  PHYSICAL EXAMINATION:   Physical Exam  GENERAL:  87 y.o.-year-old patient lying in the bed with no acute distress.obese  LUNGS: decreased breath sounds bilaterally, no wheezing, rales, rhonchi. No use of accessory muscles of respiration.  CARDIOVASCULAR: S1, S2 normal. No murmurs, rubs, or gallops.  ABDOMEN: Soft, nontender, nondistended. Bowel sounds present. No organomegaly or mass.  EXTREMITIES: No cyanosis, clubbing or edema b/l.    NEUROLOGIC: non focal PSYCHIATRIC:  patient is alert and awake SKIN: No obvious rash, lesion, or ulcer.   LABORATORY PANEL:  CBC Recent Labs  Lab 04/12/21 0540  WBC 10.7*  HGB 10.8*  HCT 32.9*  PLT 290     Chemistries  Recent Labs  Lab 04/11/21 1230 04/12/21 0540 04/12/21 2345 04/13/21 0444 04/14/21 0450  NA 133*   < > 131*  --   --   K 3.7   < > 3.5  --   --   CL 92*   < > 93*  --   --   CO2 25   < > 28  --   --   GLUCOSE 75   < > 96  --   --   BUN 18   < > 28*  --   --   CREATININE 0.72   < > 1.26*   < > 0.76  CALCIUM  9.7   < > 8.5*  --   --   MG  --   --  1.9  --   --   AST 45*  --   --   --   --   ALT 21  --   --   --   --   ALKPHOS 75  --   --   --   --   BILITOT 1.4*  --   --   --   --    < > = values in this interval not displayed.    Cardiac Enzymes No results for input(s): TROPONINI in the last 168 hours. RADIOLOGY:  CT HEAD WO CONTRAST (5MM)  Result Date: 04/12/2021 CLINICAL DATA:  Neuro deficit acute stroke suspected. EXAM: CT HEAD WITHOUT CONTRAST TECHNIQUE: Contiguous axial images were obtained from the base of the skull through the vertex without intravenous contrast. COMPARISON:  Head CT 04/11/2021 FINDINGS: Brain: Stable age related cerebral atrophy, ventriculomegaly and periventricular white matter disease.  No extra-axial fluid collections are identified. No CT findings for acute hemispheric infarction or intracranial hemorrhage. No mass lesions. The brainstem and cerebellum are normal. Vascular: Stable vascular calcifications. No aneurysm or hyperdense vessels. Skull: No skull fracture or bone lesions. Sinuses/Orbits: The paranasal sinuses and mastoid air cells are clear. Other: No scalp lesions or scalp hematoma. IMPRESSION: 1. Stable age related cerebral atrophy, ventriculomegaly and periventricular white matter disease. 2. No acute intracranial findings or mass lesions. Electronically Signed   By: Marijo Sanes M.D.   On: 04/12/2021 14:57   US Carotid Bilateral  Result Date: 04/12/2021 CLINICAL DATA:  Neurocognitive deficits, hypertension, coronary artery disease, hyperlipidemia, diabetes EXAM: BILATERAL CAROTID DUPLEX ULTRASOUND TECHNIQUE: Pearline Cables scale imaging, color Doppler and duplex ultrasound were performed of bilateral carotid and vertebral arteries in the neck. Technologist describes technically difficult study secondary to altered mental status and agitation. COMPARISON:  01/06/2017 and previous FINDINGS: Criteria: Quantification of carotid stenosis is based on velocity parameters that correlate the residual internal carotid diameter with NASCET-based stenosis levels, using the diameter of the distal internal carotid lumen as the denominator for stenosis measurement. The following velocity measurements were obtained: RIGHT ICA:   80/19 cm/sec CCA: 884/16 cm/sec SYSTOLIC ICA/CCA RATIO:  0.7 ECA: 153 cm/sec LEFT ICA: 87/21 cm/sec CCA: 60/6 cm/sec SYSTOLIC ICA/CCA RATIO:  1.4 ECA: 91 cm/sec RIGHT CAROTID ARTERY: Irregular partially calcified plaque in the distal common carotid artery, bulb, and proximal ICA resulting in at least mild stenosis. Normal waveforms and color Doppler signal throughout however. No high-grade stenosis. RIGHT VERTEBRAL ARTERY:  Normal flow direction and waveform. LEFT CAROTID ARTERY:  Partially calcified plaque in the distal common carotid, bulb and proximal ICA resulting in at least mild stenosis. Some distal acoustic shadowing. Normal waveforms and color Doppler signal throughout. LEFT VERTEBRAL ARTERY:  Normal flow direction and waveform. IMPRESSION: 1. Bilateral carotid bifurcation plaque resulting in less than 50% diameter ICA stenosis. 2. Antegrade bilateral vertebral arterial flow. Electronically Signed   By: Lucrezia Europe M.D.   On: 04/12/2021 14:29   EEG adult  Result Date: 04/13/2021 Lora Havens, MD     04/13/2021  9:34 AM Patient Name: Armando Lauman MRN: [id number] Epilepsy Attending: Lora Havens Referring Physician/Provider: Dr Rupert Stacks Date: 04/13/2021 Duration: 25.46 mins Patient history: 87yo F with ams. EEG to evaluate for seizure Level of alertness: Awake AEDs during EEG study: None Technical aspects: This EEG study was done with scalp electrodes positioned according to the  10-20 International system of electrode placement. Electrical activity was acquired at a sampling rate of 500Hz  and reviewed with a high frequency filter of 70Hz  and a low frequency filter of 1Hz . EEG data were recorded continuously and digitally stored. Description: No posterior dominant rhythm was seen. EEG showed continuous generalized 3 to 6 Hz theta-delta slowing. Generalized triphasic waves were also noted intermittently.  Photic driving was not seen during photic stimulation. Hyperventilation was not performed.   ABNORMALITY - Continuous slow, generalized - Triphasic waves, generalized IMPRESSION: This study is suggestive of moderate diffuse encephalopathy, nonspecific etiology but likely related to toxic-metabolic causes. No seizures or definite epileptiform discharges were seen throughout the recording. Of note, report was delayed as eeg was saved locally and not available for review. [place]   ASSESSMENT AND PLAN:   Amber [name] is a 87 y.o. female with medical history  significant for CAD, COPD, OSA, history of insulin-dependent diabetes mellitus, hyperlipidemia, anxiety, seizure disorder, atrial fibrillation, TIA, who presents to the emergency department for chief concerns of altered mental status.   Altered mental status/acute metabolic encephalopathy to acute respiratory failure with hypoxia in the setting of congestive heart failure combined diastolic systolic acute on chronic -- patient came in with shortness of breath and confusion. Her sats were in the upper 80s. -- patient's mentation much improved answered question approved early-- BNP more than 3000. Will continue IV Lasix daily the-- overnight pulse oximetry and assess for home oxygen use -- will resume cardiac meds once med rec has been completed -- patient follows with [facility]ology at [facility] Dr. Nehemiah Massed -- monitor input output, creatinine. -- Patient was seen by neurology--repeat CT head neg -EEG neg --Abg on 10/8 --nothing acute --10/9-- awake and alert. PT consult placed. TOC for d/c planning. D/c IV lasix--change to po. UOP 1100cc  One out of two positive blood culture history of pacemaker -- patient received one dose of vancomycin -- discussed with ID pharmacist will repeat blood culture. Continue to follow if remains negative discontinue antibiotic --10/8-- repeat BC negative --10/9--repeat BC neg--no indicaiton for abxs  type II diabetes, hyperglycemia Hyperlipidemia -- resume home dose insulin and sliding scale -- continue pravastatin  history of seizure disorder -- on Keppra--low dose  history of a fib -- on Coreg and eliquis   obstructive sleep apnea -- CPAP acute nightly  anxiety chronic -- PRN Xanax--- holding it      Procedures: Family communication : daughter-in-law [name] on the phone Consults : CODE STATUS: DNR according to daughter-in-law who is HCP OA DVT Prophylaxis : eliquis Level of care: Med-Surg Status is: Inpatient    Dispo: The patient  is from: Home              Anticipated d/c is to: SNF              Patient currently is improving. TOC for d/c plans   Difficult to place patient No        TOTAL TIME TAKING CARE OF THIS PATIENT: 25 minutes.  >50% time spent on counselling and coordination of care  Note: This dictation was prepared with Dragon dictation along with smaller phrase technology. Any transcriptional errors that result from this process are unintentional.  Fritzi Mandes M.D    [facility]   CC: Primary care physician; Idelle Crouch, MD Patient ID: Amber [name], female   DOB: [birth year], 87 y.o.   MRN: [id number]

## 2021-04-15 LAB — GLUCOSE, CAPILLARY
Glucose-Capillary: 186 mg/dL — ABNORMAL HIGH (ref 70–99)
Glucose-Capillary: 226 mg/dL — ABNORMAL HIGH (ref 70–99)
Glucose-Capillary: 146 mg/dL — ABNORMAL HIGH (ref 70–99)
Glucose-Capillary: 183 mg/dL — ABNORMAL HIGH (ref 70–99)

## 2021-04-15 LAB — RESP PANEL BY RT-PCR (FLU A&B, COVID) ARPGX2
Influenza A by PCR: NEGATIVE
Influenza B by PCR: NEGATIVE
SARS Coronavirus 2 by RT PCR: NEGATIVE

## 2021-04-15 MED ORDER — COVID-19MRNA BIVAL VACC PFIZER 30 MCG/0.3ML IM SUSP
0.3000 mL | Freq: Once | INTRAMUSCULAR | Status: AC
  Administered 2021-04-15: 0.3 mL via INTRAMUSCULAR
  Filled 2021-04-15: qty 0.3

## 2021-04-15 NOTE — Progress Notes (Signed)
[place] at [place] NAME: Amber [name]    MR#:  [id number]  DATE OF BIRTH:  1930-03-29  SUBJECTIVE:  per RN patient had a large bowel movement. She is awake alert and oriented. Eating well. Working with therapy. REVIEW OF SYSTEMS:   Review of Systems  Unable to perform ROS: Mental status change  Constitutional:  Negative for chills, fever and weight loss.  HENT:  Negative for ear discharge, ear pain and nosebleeds.   Eyes:  Negative for blurred vision, pain and discharge.  Respiratory:  Negative for sputum production, shortness of breath, wheezing and stridor.   Cardiovascular:  Negative for chest pain, palpitations, orthopnea and PND.  Gastrointestinal:  Negative for abdominal pain, diarrhea, nausea and vomiting.  Genitourinary:  Negative for frequency and urgency.  Musculoskeletal:  Negative for back pain and joint pain.  Neurological:  Positive for weakness. Negative for sensory change, speech change and focal weakness.  Psychiatric/Behavioral:  Negative for depression and hallucinations. The patient is not nervous/anxious.   Tolerating Diet:yes Tolerating PT: SNF  DRUG ALLERGIES:   Allergies  Allergen Reactions   Ambien [Zolpidem Tartrate] Other (See Comments)    Reaction:  Keeps pt awake    Penicillins Itching and Other (See Comments)    Has patient had a PCN reaction causing immediate rash, facial/tongue/throat swelling, SOB or lightheadedness with hypotension: No Has patient had a PCN reaction causing severe rash involving mucus membranes or skin necrosis: No Has patient had a PCN reaction that required hospitalization No Has patient had a PCN reaction occurring within the last 10 years: No If all of the above answers are "NO", then may proceed with Cephalosporin use.   Iodine Itching   Succinylcholine Other (See Comments)    Reaction:  Unknown    Etodolac Other (See Comments)    GI upset   Nsaids Other (See  Comments)    Reaction: gi upset    VITALS:  Blood pressure (!) 158/59, pulse 66, temperature 98.2 F (36.8 C), temperature source Oral, resp. rate 20, height 4\' 11"  (1.499 m), weight 76.6 kg, SpO2 99 %.  PHYSICAL EXAMINATION:   Physical Exam  GENERAL:  87 y.o.-year-old patient lying in the bed with no acute distress.obese  LUNGS: decreased breath sounds bilaterally, no wheezing, rales, rhonchi. No use of accessory muscles of respiration.  CARDIOVASCULAR: S1, S2 normal. No murmurs, rubs, or gallops.  ABDOMEN: Soft, nontender, nondistended. Bowel sounds present. No organomegaly or mass.  EXTREMITIES: No cyanosis, clubbing or edema b/l.    NEUROLOGIC: non focal PSYCHIATRIC:  patient is alert and awake SKIN: No obvious rash, lesion, or ulcer.   LABORATORY PANEL:  CBC Recent Labs  Lab 04/12/21 0540  WBC 10.7*  HGB 10.8*  HCT 32.9*  PLT 290     Chemistries  Recent Labs  Lab 04/11/21 1230 04/12/21 0540 04/12/21 2345 04/13/21 0444 04/14/21 0450  NA 133*   < > 131*  --   --   K 3.7   < > 3.5  --   --   CL 92*   < > 93*  --   --   CO2 25   < > 28  --   --   GLUCOSE 75   < > 96  --   --   BUN 18   < > 28*  --   --   CREATININE 0.72   < > 1.26*   < > 0.76  CALCIUM 9.7   < >  8.5*  --   --   MG  --   --  1.9  --   --   AST 45*  --   --   --   --   ALT 21  --   --   --   --   ALKPHOS 75  --   --   --   --   BILITOT 1.4*  --   --   --   --    < > = values in this interval not displayed.    Cardiac Enzymes No results for input(s): TROPONINI in the last 168 hours. RADIOLOGY:  No results found. ASSESSMENT AND PLAN:   Amber [name] is a 87 y.o. female with medical history significant for CAD, COPD, OSA, history of insulin-dependent diabetes mellitus, hyperlipidemia, anxiety, seizure disorder, atrial fibrillation, TIA, who presents to the emergency department for chief concerns of altered mental status.   Altered mental status/acute metabolic encephalopathy to acute  respiratory failure with hypoxia in the setting of congestive heart failure combined diastolic systolic acute on chronic -- patient came in with shortness of breath and confusion. Her sats were in the upper 80s. -- patient's mentation much improved answered question approved early-- BNP more than 3000. Will continue IV Lasix daily the-- overnight pulse oximetry and assess for home oxygen use -- will resume cardiac meds once med rec has been completed -- patient follows with cardiology at [facility] clinic Dr. Nehemiah Massed -- monitor input output, creatinine. -- Patient was seen by neurology--repeat CT head neg -EEG neg --Abg on 10/8 --nothing acute --10/9-- awake and alert. PT consult placed. TOC for d/c planning. D/c IV lasix--change to po. UOP 1100cc 10/10-- no new symptoms. Patient improving. Mentation at baseline.  One out of two positive blood culture history of pacemaker -- patient received one dose of vancomycin -- discussed with ID pharmacist will repeat blood culture. Continue to follow if remains negative discontinue antibiotic --10/8-- repeat BC negative --10/9--repeat BC neg--no indicaiton for abxs  type II diabetes, hyperglycemia Hyperlipidemia -- resume home dose insulin and sliding scale -- continue pravastatin  history of seizure disorder -- on Keppra--low dose  history of a fib -- on Coreg and eliquis   obstructive sleep apnea -- CPAP acute nightly  anxiety chronic -- PRN Xanax--- holding it      Procedures: Family communication : daughter-in-law [name] on the phone Consults : CODE STATUS: DNR according to daughter-in-law who is HCP OA DVT Prophylaxis : eliquis Level of care: Med-Surg Status is: Inpatient    Dispo: The patient is from: Home              Anticipated d/c is to: SNF              Patient currently is improving. TOC for d/c plans. Awaiting rehab bed and insurance authorization   Difficult to place patient No        TOTAL TIME TAKING  CARE OF THIS PATIENT: 25 minutes.  >50% time spent on counselling and coordination of care  Note: This dictation was prepared with Dragon dictation along with smaller phrase technology. Any transcriptional errors that result from this process are unintentional.  Fritzi Mandes M.D    [facility]   CC: Primary care physician; Idelle Crouch, MD Patient ID: Piedad Climes, female   DOB: [birth year], 87 y.o.   MRN: [id number]

## 2021-04-15 NOTE — TOC Progression Note (Signed)
Transition of Care [name]) - Progression Note    Patient Details  Name: Amber [name] MRN: [id number] Date of Birth: 01-16-1930  Transition of [facility]) CM/SW [place], RN Phone Number: 04/15/2021, 9:42 AM  Clinical Narrative:   Reached out to the local facilities and requested them to review for a bed offer, she prefers [facility], awaiting to hear back    Expected Discharge Plan: [place] Barriers to Discharge: Continued Medical Work up  Expected Discharge Plan and Services Expected Discharge Plan: [place] Choice: [place] arrangements for the past 2 months: Single Family Home                                       Social Determinants of Health (SDOH) Interventions    Readmission Risk Interventions No flowsheet data found.

## 2021-04-15 NOTE — Progress Notes (Signed)
Speech Language Pathology Treatment: Dysphagia  Patient Details Name: Amber [name] MRN: [id number] DOB: 04/04/1930 Today's Date: 04/15/2021 Time: 2703-5009 SLP Time Calculation (min) (ACUTE ONLY): 27 min  Assessment / Plan / Recommendation Clinical Impression  Swallowing reassessed through treatment session. Pt is alert and pleasant. Reports she wants some water. Daughter present and supportive. Pt seated upright and in bed. Tolerated ice chips and 1 Tsp of thin liquid well with no s/s of aspiration. Pt tolerated sips of thin water by cup x4 with no s/s of aspiration but presented with significant coughing after the last sip given. Pt needed several minutes of coughing to clear the water. Discussion with Pt and family about continueing on the nectar thick liquids for now. Will reattempt with thin BY TSP for next treatment session as Pt really wants to drink water and does not like the thickened liquids. Pt may have ice chips if alert and oral care has been performed. Rec ST follow at skilled nsg facility in hopes of improvement enough to upgrade liquid and food textures to premorbid diet.   HPI HPI: Pt is a 87 y.o. female past medical history of CHF, chronic back pain, coronary artery disease, diabetes, hypertension, hyperlipidemia, who comes from home for evaluation of altered mental status.  No family is present at bedside-history obtained from chart review.  ER notes-patient brought in for concerns for altered mental status and shortness of breath with saturations at 88% requiring duo nebs by EMS.  According to the chart review, daughter-in-law who is the [facility] POA states that patient started acting confused not knowing who the family members are when at baseline she is not confused.  No fevers or chills.  Has a baseline cough that has been unchanged.  No sick contacts.  Per Neurology note, likely toxic metabolic derangements leading to encephalopathy.  Head CT: No acute finding by CT. age related  volume loss. Chronic  small-vessel ischemic changes of the white matter.  CT chest: bilateral pleural effusions layering dependently, larger on the right than the left, with dependent atelectasis.   Hiatal hernia.      SLP Plan  Continue with current plan of care      Recommendations for follow up therapy are one component of a multi-disciplinary discharge planning process, led by the attending physician.  Recommendations may be updated based on patient status, additional functional criteria and insurance authorization.    Recommendations  Diet recommendations: Dysphagia 1 (puree);Nectar-thick liquid Medication Administration: Crushed with puree Compensations: Minimize environmental distractions;Slow rate Postural Changes and/or Swallow Maneuvers: Seated upright 90 degrees                Oral Care Recommendations: Oral care BID;Oral care before and after PO;Staff/trained caregiver to provide oral care Follow up Recommendations: Skilled Nursing facility SLP Visit Diagnosis: Dysphagia, oropharyngeal phase (R13.12) Plan: Continue with current plan of care       GO           Functional Limitations: Swallowing    Amber [name]  04/15/2021, 1:58 PM

## 2021-04-15 NOTE — Progress Notes (Signed)
Occupational Therapy Evaluation Patient Details Name: Amber [name] MRN: [id number] DOB: 03-05-1930 Today's Date: 04/15/2021   History of Present Illness Patient is a 87 year old woman with altered mental status in the setting of shortness of breath and possible CHF exacerbation. 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 Toxic metabolic encephalopathy. CT of head reports no acute intracranial findings or mass lesions   Clinical Impression   Amber [name] was seen for OT evaluation this date. Prior to hospital admission, pt was independent in some ADLs but dependent on family for IADLs. Pt lives with son and daughter-in-law. Currently pt demonstrates impairments as described below (See OT problem list) which functionally limit her ability to perform ADL/self-care tasks. Pt currently requires MAX A don/doff socks, sitting EOB. MAX A x2 sit<> stand. MOD A self feeding, bedside HOB elevated.  Pt would benefit from skilled OT services to address noted impairments and functional limitations (see below for any additional details) in order to maximize safety and independence while minimizing falls risk and caregiver burden. Upon hospital discharge, recommend SNF to maximize pt safety and return to PLOF.       Recommendations for follow up therapy are one component of a multi-disciplinary discharge planning process, led by the attending physician.  Recommendations may be updated based on patient status, additional functional criteria and insurance authorization.   Follow Up Recommendations  SNF    Equipment Recommendations  Wheelchair (measurements OT)    Recommendations for Other Services       Precautions / Restrictions Precautions Precautions: Fall Restrictions Weight Bearing Restrictions: No      Mobility Bed Mobility Overal bed mobility: Needs  Assistance Bed Mobility: Supine to Sit;Sit to Supine     Supine to sit: Mod assist;+2 for physical assistance Sit to supine: Max assist        Transfers Overall transfer level: Needs assistance Equipment used: Rolling walker (2 wheeled) Transfers: Sit to/from Stand Sit to Stand: Mod assist;+2 physical assistance              Balance Overall balance assessment: Needs assistance Sitting-balance support: Feet supported Sitting balance-Leahy Scale: Fair     Standing balance support: Bilateral upper extremity supported Standing balance-Leahy Scale: Poor                             ADL either performed or assessed with clinical judgement   ADL Overall ADL's : Needs assistance/impaired                                       General ADL Comments: MAX A don/doff socks, sitting EOB. MAX A x2 sit<> stand. MOD A self feeding, bedside HOB elevated.                         Pertinent Vitals/Pain Pain Assessment: Faces Faces Pain Scale: Hurts a little bit Pain Descriptors / Indicators: Discomfort Pain Intervention(s): Limited activity within patient's tolerance     Hand Dominance     Extremity/Trunk Assessment Upper Extremity Assessment Upper Extremity Assessment: RUE deficits/detail RUE Deficits / Details: pt limited mvmt due to reported arthritis in shoulder   Lower Extremity Assessment Lower Extremity Assessment: Generalized weakness  Communication Communication Communication: No difficulties   Cognition Arousal/Alertness: Awake/alert Behavior During Therapy: WFL for tasks assessed/performed Overall Cognitive Status: Within Functional Limits for tasks assessed                                 General Comments: Pt oriented to person, place, situation, and season. She was not able to correctly identify time of day.   General Comments  O2 at 88-91% on RA    Exercises Exercises: Other exercises Other  Exercises Other Exercises: Pt educated re: Ot role, d/c recs, HEP, ECS, falls prevention Other Exercises: Sup<>sit, sit<>stand, don socks, self-feeding   Shoulder Instructions      Home Living Family/patient expects to be discharged to:: Private residence Living Arrangements: Children Available Help at Discharge: Family;Available 24 hours/day Type of Home: Mobile home Home Access: Ramped entrance     Home Layout: One level     Bathroom Shower/Tub: Walk-in shower;Tub/shower unit   Bathroom Toilet: Standard     Home Equipment: Environmental consultant - 4 wheels;Bedside commode;Wheelchair - manual          Prior Functioning/Environment Level of Independence: Needs assistance  Gait / Transfers Assistance Needed: Daughter reports she could ambulate short distances with walker and uses wheelchair at baseline. ADL's / Homemaking Assistance Needed: Daughter reports she could shower/dress independently. She could not drive, complete most IADLs.            OT Problem List: Decreased strength;Decreased activity tolerance;Impaired balance (sitting and/or standing);Decreased safety awareness;Decreased knowledge of use of DME or AE;Decreased knowledge of precautions      OT Treatment/Interventions: Self-care/ADL training;Therapeutic exercise;DME and/or AE instruction;Energy conservation;Therapeutic activities    OT Goals(Current goals can be found in the care plan section) Acute Rehab OT Goals Patient Stated Goal: to go home OT Goal Formulation: With patient/family Time For Goal Achievement: 04/29/21 Potential to Achieve Goals: Good ADL Goals Pt Will Perform Grooming: sitting;with min assist (tolerate sitting ~8 min) Pt Will Perform Lower Body Dressing: sit to/from stand;with min assist;with adaptive equipment (LRAD prn) Pt Will Transfer to Toilet: bedside commode;stand pivot transfer;with min assist  OT Frequency: Min 1X/week   Barriers to D/C:            Co-evaluation               AM-PAC OT "6 Clicks" Daily Activity     Outcome Measure Help from another person eating meals?: A Lot Help from another person taking care of personal grooming?: A Little Help from another person toileting, which includes using toliet, bedpan, or urinal?: A Lot Help from another person bathing (including washing, rinsing, drying)?: A Lot Help from another person to put on and taking off regular upper body clothing?: A Lot Help from another person to put on and taking off regular lower body clothing?: A Lot 6 Click Score: 13   End of Session Equipment Utilized During Treatment: Gait belt;Rolling walker  Activity Tolerance: Patient limited by fatigue;Patient tolerated treatment well Patient left: in bed;with call bell/phone within reach;with bed alarm set;with family/visitor present  OT Visit Diagnosis: Unsteadiness on feet (R26.81);Other abnormalities of gait and mobility (R26.89);Muscle weakness (generalized) (M62.81)                Time: 1000-1037 OT Time Calculation (min): 37 min Charges:  OT General Charges $OT Visit: 1 Visit OT Evaluation $OT Eval Low Complexity: 1 Low OT Treatments $Self Care/Home Management : 8-22 mins $  Therapeutic Activity: 8-22 mins  Nino Glow, OTS   Nino Glow 04/15/2021, 12:17 PM

## 2021-04-15 NOTE — Progress Notes (Signed)
Physical Therapy Treatment Patient Details Name: Amber [name] MRN: [id number] DOB: Jun 06, 1930 Today's Date: 04/15/2021   History of Present Illness Patient is a 86 year old woman with altered mental status in the setting of shortness of breath and possible CHF exacerbation. 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 Toxic metabolic encephalopathy. CT of head reports no acute intracranial findings or mass lesions.    PT Comments    Pt resting in bed upon PT arrival and requesting to get OOB to recliner.  Mod assist semi-supine to sitting edge of bed; min to mod assist to stand up to RW (x2 trials); pt only able to take 1 step with L LE first attempt (using RW)--pt then sat down on bed to rest--on 2nd attempt pt able to take steps with mild increased effort bed to recliner with RW use (CGA to min assist x1).  Will continue to focus on strengthening and progressive functional mobility during hospitalization.   Recommendations for follow up therapy are one component of a multi-disciplinary discharge planning process, led by the attending physician.  Recommendations may be updated based on patient status, additional functional criteria and insurance authorization.  Follow Up Recommendations  SNF     Equipment Recommendations  Other (comment) (pt has needed DME at home)    Recommendations for Other Services       Precautions / Restrictions Precautions Precautions: Fall Restrictions Weight Bearing Restrictions: No     Mobility  Bed Mobility Overal bed mobility: Needs Assistance Bed Mobility: Supine to Sit     Supine to sit: Mod assist     General bed mobility comments: assist for trunk>LE's; vc's for technique    Transfers Overall transfer level: Needs assistance Equipment used: Rolling walker (2 wheeled) Transfers: Sit to/from  Stand Sit to Stand: Min assist;Mod assist         General transfer comment: assist to initiate stand; vc's for technique; x2 trials standing from bed  Ambulation/Gait Ambulation/Gait assistance: Min guard;Min assist Gait Distance (Feet): 3 Feet (bed to recliner) Assistive device: Rolling walker (2 wheeled)   Gait velocity: decreased   General Gait Details: pt able to take 1 step with L LE first attempt--pt then sat down on bed to rest; 2nd attempt pt able to take steps with mild increased effort bed to recliner   Stairs             Wheelchair Mobility    Modified Rankin (Stroke Patients Only)       Balance Overall balance assessment: Needs assistance Sitting-balance support: No upper extremity supported;Feet supported Sitting balance-Leahy Scale: Fair Sitting balance - Comments: steady static sitting   Standing balance support: Bilateral upper extremity supported Standing balance-Leahy Scale: Fair Standing balance comment: steady static standing with B UE support on RW                            Cognition Arousal/Alertness: Awake/alert Behavior During Therapy: Flat affect Overall Cognitive Status: Within Functional Limits for tasks assessed                                 General Comments: Oriented to person, place, and situation.      Exercises      General Comments  Nursing cleared pt for participation in  physical therapy.  Pt agreeable to PT session.  Pt's daughter present during session.      Pertinent Vitals/Pain Pain Assessment: No/denies pain Pain Intervention(s): Limited activity within patient's tolerance;Monitored during session;Repositioned    Home Living                      Prior Function            PT Goals (current goals can now be found in the care plan section) Acute Rehab PT Goals Patient Stated Goal: to go home PT Goal Formulation: With patient Time For Goal Achievement: 04/28/21 Potential  to Achieve Goals: Good Progress towards PT goals: Progressing toward goals    Frequency    Min 2X/week      PT Plan Current plan remains appropriate    Co-evaluation              AM-PAC PT "6 Clicks" Mobility   Outcome Measure  Help needed turning from your back to your side while in a flat bed without using bedrails?: A Little Help needed moving from lying on your back to sitting on the side of a flat bed without using bedrails?: A Lot Help needed moving to and from a bed to a chair (including a wheelchair)?: A Little Help needed standing up from a chair using your arms (e.g., wheelchair or bedside chair)?: A Lot Help needed to walk in hospital room?: A Lot Help needed climbing 3-5 steps with a railing? : Total 6 Click Score: 13    End of Session Equipment Utilized During Treatment: Gait belt Activity Tolerance: Patient tolerated treatment well Patient left: in chair;with call bell/phone within reach;with chair alarm set;with family/visitor present;Other (comment) (purewick in place) Nurse Communication: Mobility status PT Visit Diagnosis: Unsteadiness on feet (R26.81);Muscle weakness (generalized) (M62.81);Difficulty in walking, not elsewhere classified (R26.2)     Time: 1430-1500 PT Time Calculation (min) (ACUTE ONLY): 30 min  Charges:  $Therapeutic Activity: 23-37 mins                    Leitha Bleak, PT 04/15/21, 5:27 PM

## 2021-04-16 ENCOUNTER — Ambulatory Visit: Payer: Medicare HMO | Admitting: Family

## 2021-04-16 DIAGNOSIS — E119 Type 2 diabetes mellitus without complications: Secondary | ICD-10-CM | POA: Diagnosis not present

## 2021-04-16 DIAGNOSIS — I5020 Unspecified systolic (congestive) heart failure: Secondary | ICD-10-CM | POA: Diagnosis not present

## 2021-04-16 DIAGNOSIS — R300 Dysuria: Secondary | ICD-10-CM | POA: Diagnosis not present

## 2021-04-16 DIAGNOSIS — R5381 Other malaise: Secondary | ICD-10-CM | POA: Diagnosis not present

## 2021-04-16 DIAGNOSIS — F445 Conversion disorder with seizures or convulsions: Secondary | ICD-10-CM | POA: Diagnosis not present

## 2021-04-16 DIAGNOSIS — R5383 Other fatigue: Secondary | ICD-10-CM | POA: Diagnosis not present

## 2021-04-16 DIAGNOSIS — R1312 Dysphagia, oropharyngeal phase: Secondary | ICD-10-CM | POA: Diagnosis not present

## 2021-04-16 DIAGNOSIS — E1165 Type 2 diabetes mellitus with hyperglycemia: Secondary | ICD-10-CM | POA: Diagnosis not present

## 2021-04-16 DIAGNOSIS — R279 Unspecified lack of coordination: Secondary | ICD-10-CM | POA: Diagnosis not present

## 2021-04-16 DIAGNOSIS — I10 Essential (primary) hypertension: Secondary | ICD-10-CM | POA: Diagnosis not present

## 2021-04-16 DIAGNOSIS — I5023 Acute on chronic systolic (congestive) heart failure: Secondary | ICD-10-CM | POA: Diagnosis not present

## 2021-04-16 DIAGNOSIS — I509 Heart failure, unspecified: Secondary | ICD-10-CM | POA: Diagnosis not present

## 2021-04-16 DIAGNOSIS — M810 Age-related osteoporosis without current pathological fracture: Secondary | ICD-10-CM | POA: Diagnosis not present

## 2021-04-16 DIAGNOSIS — G40909 Epilepsy, unspecified, not intractable, without status epilepticus: Secondary | ICD-10-CM | POA: Diagnosis not present

## 2021-04-16 DIAGNOSIS — G4733 Obstructive sleep apnea (adult) (pediatric): Secondary | ICD-10-CM | POA: Diagnosis not present

## 2021-04-16 DIAGNOSIS — I2510 Atherosclerotic heart disease of native coronary artery without angina pectoris: Secondary | ICD-10-CM | POA: Diagnosis not present

## 2021-04-16 DIAGNOSIS — R41841 Cognitive communication deficit: Secondary | ICD-10-CM | POA: Diagnosis not present

## 2021-04-16 DIAGNOSIS — Z950 Presence of cardiac pacemaker: Secondary | ICD-10-CM | POA: Diagnosis not present

## 2021-04-16 DIAGNOSIS — R4182 Altered mental status, unspecified: Secondary | ICD-10-CM | POA: Diagnosis not present

## 2021-04-16 DIAGNOSIS — I4891 Unspecified atrial fibrillation: Secondary | ICD-10-CM | POA: Diagnosis not present

## 2021-04-16 DIAGNOSIS — E785 Hyperlipidemia, unspecified: Secondary | ICD-10-CM | POA: Diagnosis not present

## 2021-04-16 DIAGNOSIS — M6281 Muscle weakness (generalized): Secondary | ICD-10-CM | POA: Diagnosis not present

## 2021-04-16 DIAGNOSIS — J9621 Acute and chronic respiratory failure with hypoxia: Secondary | ICD-10-CM | POA: Diagnosis not present

## 2021-04-16 LAB — BLOOD GAS, VENOUS
Acid-Base Excess: 4.7 mmol/L — ABNORMAL HIGH (ref 0.0–2.0)
Bicarbonate: 29.2 mmol/L — ABNORMAL HIGH (ref 20.0–28.0)
O2 Saturation: 32.6 %
Patient temperature: 37
pCO2, Ven: 42 mmHg — ABNORMAL LOW (ref 44.0–60.0)
pH, Ven: 7.45 — ABNORMAL HIGH (ref 7.250–7.430)
pO2, Ven: <31 mmHg — CL (ref 32.0–45.0)

## 2021-04-16 LAB — GLUCOSE, CAPILLARY
Glucose-Capillary: 164 mg/dL — ABNORMAL HIGH (ref 70–99)
Glucose-Capillary: 209 mg/dL — ABNORMAL HIGH (ref 70–99)

## 2021-04-16 MED ORDER — INSULIN GLARGINE 100 UNIT/ML ~~LOC~~ SOLN: 10.0000 [IU] | Freq: Every day | SUBCUTANEOUS | 11 refills | Status: DC

## 2021-04-16 MED ORDER — CYANOCOBALAMIN 1000 MCG PO TABS: 1000.0000 ug | ORAL_TABLET | Freq: Every day | ORAL | 0 refills | Status: DC

## 2021-04-16 MED ORDER — LEVETIRACETAM 250 MG PO TABS: 250.0000 mg | ORAL_TABLET | Freq: Two times a day (BID) | ORAL | 0 refills | Status: DC

## 2021-04-16 MED ORDER — ACETAMINOPHEN 500 MG PO TABS: 500.0000 mg | ORAL_TABLET | Freq: Four times a day (QID) | ORAL | 0 refills | Status: DC | PRN

## 2021-04-16 NOTE — TOC Progression Note (Addendum)
Transition of Care [facility]) - Progression Note    Patient Details  Name: Amber [name] MRN: [id number] Date of Birth: Dec 13, 1929  Transition of Care [facility]) CM/SW [place], RN Phone Number: 04/16/2021, 8:41 AM  Clinical Narrative:    Received notification that the insurance was approved to go to [facility], she will go to room 713, the bedside nurse to call report to [phone], [facility] to pick up for transport at 330, her daughter [name] aware   Expected Discharge Plan: [place] Barriers to Discharge: Continued Medical Work up  Expected Discharge Plan and Services Expected Discharge Plan: [place] Choice: [place] arrangements for the past 2 months: Single Family Home                                       Social Determinants of Health (SDOH) Interventions    Readmission Risk Interventions No flowsheet data found.

## 2021-04-16 NOTE — Progress Notes (Signed)
Report called to Charlie at [employer]. Per case manager transportation set for 3:30

## 2021-04-16 NOTE — Care Management Important Message (Signed)
Important Message  Patient Details  Name: Kamarie Veno MRN: [id number] Date of Birth: 1929/08/05   Medicare Important Message Given:  Yes ((late documentation IM given on 10 10 22))     Loann Quill 04/16/2021, 10:54 AM

## 2021-04-16 NOTE — Discharge Summary (Signed)
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  SECONDARY DIAGNOSIS:   Past Medical History:  Diagnosis Date   Anemia    Anxiety    Arthritis    Benign neoplasm of colon    CHF (congestive heart failure) (HCC)    Chronic kidney disease    Chronic pain    Complication of anesthesia 1980's   hard time waking up   Coronary artery disease with unspecified angina pectoris    Cough    Diabetes mellitus without complication ([place])    Essential hypertension    GERD (gastroesophageal reflux disease)    High risk medication use    Hyperlipidemia    Plantar fascial fibromatosis    Presence of permanent cardiac pacemaker    Shortness of breath dyspnea     HOSPITAL COURSE:   Amber [name] is a 87 y.o. female with medical history significant for CAD, COPD, OSA, history of insulin-dependent diabetes mellitus, hyperlipidemia, anxiety, seizure disorder, atrial fibrillation, TIA, who presents to the emergency department for chief concerns of altered mental status.   Altered mental status/acute metabolic encephalopathy to acute respiratory failure with hypoxia in the setting of congestive heart failure combined diastolic systolic acute on chronic -- patient came in with shortness of breath and confusion. Her sats were in the upper 80s. -- patient's mentation much improved answered question  approved early-- BNP more than 3000. Will continue IV Lasix daily the-- overnight pulse oximetry and assess for home oxygen use -- will resume cardiac meds once med rec has been completed -- patient follows with cardiology at [facility] clinic Dr. Nehemiah Massed -- monitor input output, creatinine. -- Patient was seen by neurology--repeat CT head neg -EEG neg --Abg on 10/8 --nothing acute --10/9-- awake and alert. PT consult placed. TOC for d/c planning. D/c IV lasix--change to po. UOP 1100cc 10/10-- no new symptoms. Patient improving. Mentation at baseline. -10/11--doing well. Vitals stable   One out of two positive blood culture history of pacemaker -- patient received one dose of vancomycin -- discussed with ID pharmacist will repeat blood culture. Continue to follow if remains negative discontinue antibiotic --10/8-- repeat BC negative --10/9--repeat BC neg--no indication for abxs   type II diabetes, hyperglycemia Hyperlipidemia -- resume home dose insulin and sliding scale -- continue pravastatin   history of seizure disorder -- on Keppra--low dose   history of a fib -- on Coreg and eliquis    obstructive sleep apnea -- CPAP acute nightly   anxiety chronic -- PRN Xanax--- holding it    TOC oinformed pt has insurance auth for [facility]. Left msg for [name] Manasco about d/c plans      Procedures: Family communication : daughter-in-law [name] on the phone Consults : CODE STATUS: DNR according to daughter-in-law who is HCP OA DVT Prophylaxis : eliquis Level of care: Med-Surg Status is: Inpatient       Dispo: The patient is from: Home  Anticipated d/c is to: SNF              Patient currently is improving. D/c to [facility] today              Difficult to place patient No       CONSULTS OBTAINED:    DRUG ALLERGIES:   Allergies  Allergen Reactions   Ambien [Zolpidem Tartrate] Other (See Comments)    Reaction:  Keeps pt awake    Penicillins Itching and Other (See  Comments)    Has patient had a PCN reaction causing immediate rash, facial/tongue/throat swelling, SOB or lightheadedness with hypotension: No Has patient had a PCN reaction causing severe rash involving mucus membranes or skin necrosis: No Has patient had a PCN reaction that required hospitalization No Has patient had a PCN reaction occurring within the last 10 years: No If all of the above answers are "NO", then may proceed with Cephalosporin use.   Iodine Itching   Succinylcholine Other (See Comments)    Reaction:  Unknown    Etodolac Other (See Comments)    GI upset   Nsaids Other (See Comments)    Reaction: gi upset    DISCHARGE MEDICATIONS:   Allergies as of 04/16/2021       Reactions   Ambien [zolpidem Tartrate] Other (See Comments)   Reaction:  Keeps pt awake    Penicillins Itching, Other (See Comments)   Has patient had a PCN reaction causing immediate rash, facial/tongue/throat swelling, SOB or lightheadedness with hypotension: No Has patient had a PCN reaction causing severe rash involving mucus membranes or skin necrosis: No Has patient had a PCN reaction that required hospitalization No Has patient had a PCN reaction occurring within the last 10 years: No If all of the above answers are "NO", then may proceed with Cephalosporin use.   Iodine Itching   Succinylcholine Other (See Comments)   Reaction:  Unknown    Etodolac Other (See Comments)   GI upset   Nsaids Other (See Comments)   Reaction: gi upset        Medication List     STOP taking these medications    ALPRAZolam 1 MG tablet Commonly known as: XANAX   amLODipine 10 MG tablet Commonly known as: NORVASC   gabapentin 100 MG capsule Commonly known as: NEURONTIN       TAKE these medications    acetaminophen 500 MG tablet Commonly known as: TYLENOL Take 1 tablet (500 mg total) by mouth every 6 (six) hours as needed for mild pain or headache. What changed: how much to take   albuterol 108  (90 Base) MCG/ACT inhaler Commonly known as: VENTOLIN HFA Inhale 2 puffs into the lungs every 6 (six) hours as needed for wheezing or shortness of breath.   apixaban 5 MG Tabs tablet Commonly known as: ELIQUIS Take 5 mg by mouth 2 (two) times daily.   benzonatate 200 MG capsule Commonly known as: TESSALON Take 200 mg by mouth 3 (three) times daily as needed for cough.   calcium-vitamin D 500-200 MG-UNIT tablet Commonly known as: OSCAL WITH D Take 1 tablet by mouth daily.   carvedilol 6.25 MG tablet Commonly known as: COREG Take 1 tablet (6.25 mg total) by mouth 2 (two) times daily with a meal.   cyanocobalamin 1000 MCG tablet Take 1 tablet (1,000 mcg total) by mouth daily. Start taking on: April 17, 2021   furosemide 20 MG tablet Commonly known as: Lasix Take 1 tablet (20 mg  total) by mouth 2 (two) times daily. What changed: when to take this   insulin glargine 100 UNIT/ML injection Commonly known as: LANTUS Inject 0.1 mLs (10 Units total) into the skin daily. What changed: how much to take   levETIRAcetam 250 MG tablet Commonly known as: KEPPRA Take 1 tablet (250 mg total) by mouth 2 (two) times daily. What changed:  medication strength how much to take   lisinopril 10 MG tablet Commonly known as: ZESTRIL Take 10 mg by mouth daily.   omeprazole 40 MG capsule Commonly known as: PRILOSEC Take 40 mg by mouth daily.   pravastatin 40 MG tablet Commonly known as: PRAVACHOL Take 40 mg by mouth at bedtime.   PreserVision AREDS 2 Caps Take 1 capsule by mouth daily.        If you experience worsening of your admission symptoms, develop shortness of breath, life threatening emergency, suicidal or homicidal thoughts you must seek medical attention immediately by calling 911 or calling your MD immediately  if symptoms less severe.  You Must read complete instructions/literature along with all the possible adverse reactions/side effects for all the Medicines you  take and that have been prescribed to you. Take any new Medicines after you have completely understood and accept all the possible adverse reactions/side effects.   Please note  You were cared for by a hospitalist during your hospital stay. If you have any questions about your discharge medications or the care you received while you were in the hospital after you are discharged, you can call the unit and asked to speak with the hospitalist on call if the hospitalist that took care of you is not available. Once you are discharged, your primary care physician will handle any further medical issues. Please note that NO REFILLS for any discharge medications will be authorized once you are discharged, as it is imperative that you return to your primary care physician (or establish a relationship with a primary care physician if you do not have one) for your aftercare needs so that they can reassess your need for medications and monitor your lab values. Today   SUBJECTIVE   No new complaints Mentation at baseline  VITAL SIGNS:  Blood pressure (!) 166/69, pulse 63, temperature 98.2 F (36.8 C), temperature source Oral, resp. rate 18, height 4\' 11"  (1.499 m), weight 76.6 kg, SpO2 96 %.  I/O:   Intake/Output Summary (Last 24 hours) at 04/16/2021 0854 Last data filed at 04/15/2021 1257 Gross per 24 hour  Intake 10 ml  Output --  Net 10 ml    PHYSICAL EXAMINATION:  GENERAL:  87 y.o.-year-old patient lying in the bed with no acute distress.  obese LUNGS: Normal breath sounds bilaterally, no wheezing, rales,rhonchi or crepitation. No use of accessory muscles of respiration.  CARDIOVASCULAR: S1, S2 normal. No murmurs, rubs, or gallops.  ABDOMEN: Soft, non-tender, non-distended. Bowel sounds present. No organomegaly or mass.  EXTREMITIES: No pedal edema, cyanosis, or clubbing.  NEUROLOGIC: Cranial nerves II through XII are intact. Muscle strength 4/5 in all extremities. Sensation intact. Gait not  checked.  PSYCHIATRIC: The patient is alert and oriented x 3.  SKIN: No obvious rash, lesion, or ulcer.   DATA REVIEW:   CBC  Recent Labs  Lab 04/12/21 0540  WBC 10.7*  HGB 10.8*  HCT 32.9*  PLT 290    Chemistries  Recent Labs  Lab 04/11/21 1230 04/12/21 0540 04/12/21 2345 04/13/21 0444 04/14/21 0450  NA 133*   < > 131*  --   --  K 3.7   < > 3.5  --   --   CL 92*   < > 93*  --   --   CO2 25   < > 28  --   --   GLUCOSE 75   < > 96  --   --   BUN 18   < > 28*  --   --   CREATININE 0.72   < > 1.26*   < > 0.76  CALCIUM 9.7   < > 8.5*  --   --   MG  --   --  1.9  --   --   AST 45*  --   --   --   --   ALT 21  --   --   --   --   ALKPHOS 75  --   --   --   --   BILITOT 1.4*  --   --   --   --    < > = values in this interval not displayed.    Microbiology Results   Recent Results (from the past 240 hour(s))  Resp Panel by RT-PCR (Flu A&B, Covid) Nasopharyngeal Swab     Status: None   Collection Time: 04/11/21 12:30 PM   Specimen: Nasopharyngeal Swab; Nasopharyngeal(NP) swabs in vial transport medium  Result Value Ref Range Status   SARS Coronavirus 2 by RT PCR NEGATIVE NEGATIVE Final    Comment: (NOTE) SARS-CoV-2 target nucleic acids are NOT DETECTED.  The SARS-CoV-2 RNA is generally detectable in upper respiratory specimens during the acute phase of infection. The lowest concentration of SARS-CoV-2 viral copies this assay can detect is 138 copies/mL. A negative result does not preclude SARS-Cov-2 infection and should not be used as the sole basis for treatment or other patient management decisions. A negative result may occur with  improper specimen collection/handling, submission of specimen other than nasopharyngeal swab, presence of viral mutation(s) within the areas targeted by this assay, and inadequate number of viral copies(<138 copies/mL). A negative result must be combined with clinical observations, patient history, and epidemiological information.  The expected result is Negative.  Fact Sheet for Patients:  EntrepreneurPulse.com.au  Fact Sheet for Healthcare Providers:  IncredibleEmployment.be  This test is no t yet approved or cleared by the [place] FDA and  has been authorized for detection and/or diagnosis of SARS-CoV-2 by FDA under an Emergency Use Authorization (EUA). This EUA will remain  in effect (meaning this test can be used) for the duration of the COVID-19 declaration under Section 564(b)(1) of the Act, 21 U.S.C.section 360bbb-3(b)(1), unless the authorization is terminated  or revoked sooner.       Influenza A by PCR NEGATIVE NEGATIVE Final   Influenza B by PCR NEGATIVE NEGATIVE Final    Comment: (NOTE) The Xpert Xpress SARS-CoV-2/FLU/RSV plus assay is intended as an aid in the diagnosis of influenza from Nasopharyngeal swab specimens and should not be used as a sole basis for treatment. Nasal washings and aspirates are unacceptable for Xpert Xpress SARS-CoV-2/FLU/RSV testing.  Fact Sheet for Patients: EntrepreneurPulse.com.au  Fact Sheet for Healthcare Providers: IncredibleEmployment.be  This test is not yet approved or cleared by the [place] FDA and has been authorized for detection and/or diagnosis of SARS-CoV-2 by FDA under an Emergency Use Authorization (EUA). This EUA will remain in effect (meaning this test can be used) for the duration of the COVID-19 declaration under Section 564(b)(1) of the Act, 21 U.S.C. section 360bbb-3(b)(1), unless  the authorization is terminated or revoked.  Performed at [facility], [address] [place], [place] [address]   Blood culture (routine x 2)     Status: Abnormal   Collection Time: 04/11/21 12:30 PM   Specimen: BLOOD  Result Value Ref Range Status   Specimen Description   Final    BLOOD LEFT ANTECUBITAL Performed at [facility], [address],  [place], [place] [address]    Special Requests   Final    BOTTLES DRAWN AEROBIC AND ANAEROBIC Blood Culture adequate volume Performed at [facility], [address] [place], [place] [address]    Culture  Setup Time   Final    GRAM POSITIVE COCCI IN BOTH AEROBIC AND ANAEROBIC BOTTLES CRITICAL RESULT CALLED TO, READ BACK BY AND VERIFIED WITH: NATHAN BELUE PHARMD 0543 04/12/21 HNM    Culture (A)  Final    STAPHYLOCOCCUS EPIDERMIDIS THE SIGNIFICANCE OF ISOLATING THIS ORGANISM FROM A SINGLE SET OF BLOOD CULTURES WHEN MULTIPLE SETS ARE DRAWN IS UNCERTAIN. PLEASE NOTIFY THE MICROBIOLOGY DEPARTMENT WITHIN ONE WEEK IF SPECIATION AND SENSITIVITIES ARE REQUIRED. AEROCOCCUS VIRIDANS Standardized susceptibility testing for this organism is not available. Performed at [facility], [place] [address], [place], [place] [address]    Report Status 04/14/2021 FINAL  Final  Blood Culture ID Panel (Reflexed)     Status: Abnormal   Collection Time: 04/11/21 12:30 PM  Result Value Ref Range Status   Enterococcus faecalis NOT DETECTED NOT DETECTED Final   Enterococcus Faecium NOT DETECTED NOT DETECTED Final   Listeria monocytogenes NOT DETECTED NOT DETECTED Final   Staphylococcus species DETECTED (A) NOT DETECTED Final    Comment: CRITICAL RESULT CALLED TO, READ BACK BY AND VERIFIED WITH: NATHAN BELUE PHARMD 0543 04/12/21 HNM    Staphylococcus aureus (BCID) NOT DETECTED NOT DETECTED Final   Staphylococcus epidermidis DETECTED (A) NOT DETECTED Final    Comment: Methicillin (oxacillin) resistant coagulase negative staphylococcus. Possible blood culture contaminant (unless isolated from more than one blood culture draw or clinical case suggests pathogenicity). No antibiotic treatment is indicated for blood  culture contaminants. CRITICAL RESULT CALLED TO, READ BACK BY AND VERIFIED WITH: NATHAN BELUE PHARMD 0543 04/12/21 HNM    Staphylococcus lugdunensis NOT DETECTED NOT DETECTED Final   Streptococcus  species NOT DETECTED NOT DETECTED Final   Streptococcus agalactiae NOT DETECTED NOT DETECTED Final   Streptococcus pneumoniae NOT DETECTED NOT DETECTED Final   Streptococcus pyogenes NOT DETECTED NOT DETECTED Final   A.calcoaceticus-baumannii NOT DETECTED NOT DETECTED Final   Bacteroides fragilis NOT DETECTED NOT DETECTED Final   Enterobacterales NOT DETECTED NOT DETECTED Final   Enterobacter cloacae complex NOT DETECTED NOT DETECTED Final   Escherichia coli NOT DETECTED NOT DETECTED Final   Klebsiella aerogenes NOT DETECTED NOT DETECTED Final   Klebsiella oxytoca NOT DETECTED NOT DETECTED Final   Klebsiella pneumoniae NOT DETECTED NOT DETECTED Final   Proteus species NOT DETECTED NOT DETECTED Final   Salmonella species NOT DETECTED NOT DETECTED Final   Serratia marcescens NOT DETECTED NOT DETECTED Final   Haemophilus influenzae NOT DETECTED NOT DETECTED Final   Neisseria meningitidis NOT DETECTED NOT DETECTED Final   Pseudomonas aeruginosa NOT DETECTED NOT DETECTED Final   Stenotrophomonas maltophilia NOT DETECTED NOT DETECTED Final   Candida albicans NOT DETECTED NOT DETECTED Final   Candida auris NOT DETECTED NOT DETECTED Final   Candida glabrata NOT DETECTED NOT DETECTED Final   Candida krusei NOT DETECTED NOT DETECTED Final   Candida parapsilosis  NOT DETECTED NOT DETECTED Final   Candida tropicalis NOT DETECTED NOT DETECTED Final   Cryptococcus neoformans/gattii NOT DETECTED NOT DETECTED Final   Methicillin resistance mecA/C DETECTED (A) NOT DETECTED Final    Comment: CRITICAL RESULT CALLED TO, READ BACK BY AND VERIFIED WITHLloyd Huger PHARMD 0543 04/12/21 HNM Performed at [facility], [address] [place], [place] [address]   CULTURE, BLOOD (ROUTINE X 2) w Reflex to ID Panel     Status: None (Preliminary result)   Collection Time: 04/12/21 11:06 AM   Specimen: BLOOD  Result Value Ref Range Status   Specimen Description BLOOD BLOOD RIGHT HAND  Final   Special  Requests   Final    BOTTLES DRAWN AEROBIC AND ANAEROBIC Blood Culture adequate volume   Culture   Final    NO GROWTH 4 DAYS Performed at [facility], [address] [place], [place] [address]    Report Status PENDING  Incomplete  CULTURE, BLOOD (ROUTINE X 2) w Reflex to ID Panel     Status: None (Preliminary result)   Collection Time: 04/12/21 11:14 AM   Specimen: BLOOD  Result Value Ref Range Status   Specimen Description BLOOD BLOOD LEFT HAND  Final   Special Requests   Final    BOTTLES DRAWN AEROBIC AND ANAEROBIC Blood Culture adequate volume   Culture   Final    NO GROWTH 4 DAYS Performed at [facility], [address] [place], [place] [address]    Report Status PENDING  Incomplete  Resp Panel by RT-PCR (Flu A&B, Covid) Nasopharyngeal Swab     Status: None   Collection Time: 04/15/21  3:43 PM   Specimen: Nasopharyngeal Swab; Nasopharyngeal(NP) swabs in vial transport medium  Result Value Ref Range Status   SARS Coronavirus 2 by RT PCR NEGATIVE NEGATIVE Final    Comment: (NOTE) SARS-CoV-2 target nucleic acids are NOT DETECTED.  The SARS-CoV-2 RNA is generally detectable in upper respiratory specimens during the acute phase of infection. The lowest concentration of SARS-CoV-2 viral copies this assay can detect is 138 copies/mL. A negative result does not preclude SARS-Cov-2 infection and should not be used as the sole basis for treatment or other patient management decisions. A negative result may occur with  improper specimen collection/handling, submission of specimen other than nasopharyngeal swab, presence of viral mutation(s) within the areas targeted by this assay, and inadequate number of viral copies(<138 copies/mL). A negative result must be combined with clinical observations, patient history, and epidemiological information. The expected result is Negative.  Fact Sheet for Patients:  EntrepreneurPulse.com.au  Fact Sheet  for Healthcare Providers:  IncredibleEmployment.be  This test is no t yet approved or cleared by the [place] FDA and  has been authorized for detection and/or diagnosis of SARS-CoV-2 by FDA under an Emergency Use Authorization (EUA). This EUA will remain  in effect (meaning this test can be used) for the duration of the COVID-19 declaration under Section 564(b)(1) of the Act, 21 U.S.C.section 360bbb-3(b)(1), unless the authorization is terminated  or revoked sooner.       Influenza A by PCR NEGATIVE NEGATIVE Final   Influenza B by PCR NEGATIVE NEGATIVE Final    Comment: (NOTE) The Xpert Xpress SARS-CoV-2/FLU/RSV plus assay is intended as an aid in the diagnosis of influenza from Nasopharyngeal swab specimens and should not be used as a sole basis for treatment. Nasal washings and aspirates are unacceptable for Xpert Xpress SARS-CoV-2/FLU/RSV testing.  Fact Sheet for Patients: EntrepreneurPulse.com.au  Fact Sheet for Healthcare Providers: IncredibleEmployment.be  This test is not yet approved or cleared by the [place] FDA and has been authorized for detection and/or diagnosis of SARS-CoV-2 by FDA under an Emergency Use Authorization (EUA). This EUA will remain in effect (meaning this test can be used) for the duration of the COVID-19 declaration under Section 564(b)(1) of the Act, 21 U.S.C. section 360bbb-3(b)(1), unless the authorization is terminated or revoked.  Performed at [facility], [address], [place], [place] [address]     RADIOLOGY:  No results found.   CODE STATUS:     Code Status Orders  (From admission, onward)           Start     Ordered   04/11/21 1617  Do not attempt resuscitation (DNR)  Continuous       Question Answer Comment  In the event of cardiac or respiratory ARREST Do not call a "code blue"   In the event of cardiac or respiratory ARREST Do not perform  Intubation, CPR, defibrillation or ACLS   In the event of cardiac or respiratory ARREST Use medication by any route, position, wound care, and other measures to relive pain and suffering. May use oxygen, suction and manual treatment of airway obstruction as needed for comfort.      04/11/21 1616           Code Status History     Date Active Date Inactive Code Status Order ID Comments User Context   04/11/2021 1503 04/11/2021 1616 Full Code [id number]  Cox, Briant Cedar, DO ED   02/25/2020 0151 02/27/2020 2116 Full Code [id number]  Athena Masse, MD ED   01/24/2020 0956 01/25/2020 1809 Full Code [id number]  Isaias Cowman, MD Inpatient   12/06/2019 2032 12/14/2019 1737 Full Code [id number]  Hugelmeyer, Ubaldo Glassing, DO ED   02/09/2018 1641 02/12/2018 1731 Full Code [id number]  Hessie Knows, MD Inpatient   01/26/2017 2100 01/28/2017 1536 DNR [id number]  Loletha Grayer, MD ED   07/26/2016 1616 07/28/2016 1926 Full Code [id number]  Idelle Crouch, MD Inpatient   07/25/2015 1709 07/26/2015 1801 Full Code [id number]  Aldean Jewett, MD Inpatient   03/14/2015 1433 03/15/2015 1832 Full Code [id number]  Isaias Cowman, MD Inpatient   07/27/2014 1537 07/31/2014 2233 Full Code [id number]  Penelope Coop Inpatient   07/27/2014 0505 07/27/2014 1537 Full Code [id number]  Etta Quill, DO ED        TOTAL TIME TAKING CARE OF THIS PATIENT: 40 minutes.    Fritzi Mandes M.D  [facility]    CC: Primary care physician; Idelle Crouch, MD

## 2021-04-16 NOTE — Progress Notes (Signed)
EMS here to transport pt, Daughter in Nurse, adult notified.

## 2021-04-17 DIAGNOSIS — I10 Essential (primary) hypertension: Secondary | ICD-10-CM | POA: Diagnosis not present

## 2021-04-17 DIAGNOSIS — M810 Age-related osteoporosis without current pathological fracture: Secondary | ICD-10-CM | POA: Diagnosis not present

## 2021-04-17 DIAGNOSIS — E785 Hyperlipidemia, unspecified: Secondary | ICD-10-CM | POA: Diagnosis not present

## 2021-04-17 DIAGNOSIS — I4891 Unspecified atrial fibrillation: Secondary | ICD-10-CM | POA: Diagnosis not present

## 2021-04-17 DIAGNOSIS — G40909 Epilepsy, unspecified, not intractable, without status epilepticus: Secondary | ICD-10-CM | POA: Diagnosis not present

## 2021-04-17 DIAGNOSIS — I5020 Unspecified systolic (congestive) heart failure: Secondary | ICD-10-CM | POA: Diagnosis not present

## 2021-04-17 DIAGNOSIS — E119 Type 2 diabetes mellitus without complications: Secondary | ICD-10-CM | POA: Diagnosis not present

## 2021-04-17 DIAGNOSIS — I2510 Atherosclerotic heart disease of native coronary artery without angina pectoris: Secondary | ICD-10-CM | POA: Diagnosis not present

## 2021-04-17 DIAGNOSIS — G4733 Obstructive sleep apnea (adult) (pediatric): Secondary | ICD-10-CM | POA: Diagnosis not present

## 2021-04-17 LAB — CULTURE, BLOOD (ROUTINE X 2)
Culture: NO GROWTH
Culture: NO GROWTH
Special Requests: ADEQUATE
Special Requests: ADEQUATE

## 2021-04-19 DIAGNOSIS — I2510 Atherosclerotic heart disease of native coronary artery without angina pectoris: Secondary | ICD-10-CM | POA: Diagnosis not present

## 2021-04-19 DIAGNOSIS — I4891 Unspecified atrial fibrillation: Secondary | ICD-10-CM | POA: Diagnosis not present

## 2021-04-19 DIAGNOSIS — G4733 Obstructive sleep apnea (adult) (pediatric): Secondary | ICD-10-CM | POA: Diagnosis not present

## 2021-04-19 DIAGNOSIS — G40909 Epilepsy, unspecified, not intractable, without status epilepticus: Secondary | ICD-10-CM | POA: Diagnosis not present

## 2021-04-19 DIAGNOSIS — E785 Hyperlipidemia, unspecified: Secondary | ICD-10-CM | POA: Diagnosis not present

## 2021-04-19 DIAGNOSIS — E119 Type 2 diabetes mellitus without complications: Secondary | ICD-10-CM | POA: Diagnosis not present

## 2021-04-22 DIAGNOSIS — I2510 Atherosclerotic heart disease of native coronary artery without angina pectoris: Secondary | ICD-10-CM | POA: Diagnosis not present

## 2021-04-22 DIAGNOSIS — I4891 Unspecified atrial fibrillation: Secondary | ICD-10-CM | POA: Diagnosis not present

## 2021-04-22 DIAGNOSIS — I5020 Unspecified systolic (congestive) heart failure: Secondary | ICD-10-CM | POA: Diagnosis not present

## 2021-04-22 DIAGNOSIS — M6281 Muscle weakness (generalized): Secondary | ICD-10-CM | POA: Diagnosis not present

## 2021-04-22 DIAGNOSIS — G40909 Epilepsy, unspecified, not intractable, without status epilepticus: Secondary | ICD-10-CM | POA: Diagnosis not present

## 2021-04-26 ENCOUNTER — Ambulatory Visit: Payer: Medicare HMO | Admitting: Family

## 2021-04-26 DIAGNOSIS — I4891 Unspecified atrial fibrillation: Secondary | ICD-10-CM | POA: Diagnosis not present

## 2021-04-26 DIAGNOSIS — E119 Type 2 diabetes mellitus without complications: Secondary | ICD-10-CM | POA: Diagnosis not present

## 2021-04-26 DIAGNOSIS — M6281 Muscle weakness (generalized): Secondary | ICD-10-CM | POA: Diagnosis not present

## 2021-04-26 DIAGNOSIS — G40909 Epilepsy, unspecified, not intractable, without status epilepticus: Secondary | ICD-10-CM | POA: Diagnosis not present

## 2021-04-30 DIAGNOSIS — G40909 Epilepsy, unspecified, not intractable, without status epilepticus: Secondary | ICD-10-CM | POA: Diagnosis not present

## 2021-04-30 DIAGNOSIS — E119 Type 2 diabetes mellitus without complications: Secondary | ICD-10-CM | POA: Diagnosis not present

## 2021-04-30 DIAGNOSIS — I4891 Unspecified atrial fibrillation: Secondary | ICD-10-CM | POA: Diagnosis not present

## 2021-04-30 DIAGNOSIS — M6281 Muscle weakness (generalized): Secondary | ICD-10-CM | POA: Diagnosis not present

## 2021-05-02 DIAGNOSIS — M6281 Muscle weakness (generalized): Secondary | ICD-10-CM | POA: Diagnosis not present

## 2021-05-02 DIAGNOSIS — E119 Type 2 diabetes mellitus without complications: Secondary | ICD-10-CM | POA: Diagnosis not present

## 2021-05-02 DIAGNOSIS — G40909 Epilepsy, unspecified, not intractable, without status epilepticus: Secondary | ICD-10-CM | POA: Diagnosis not present

## 2021-05-02 DIAGNOSIS — I4891 Unspecified atrial fibrillation: Secondary | ICD-10-CM | POA: Diagnosis not present

## 2021-05-04 DIAGNOSIS — I440 Atrioventricular block, first degree: Secondary | ICD-10-CM | POA: Diagnosis not present

## 2021-05-04 DIAGNOSIS — J9621 Acute and chronic respiratory failure with hypoxia: Secondary | ICD-10-CM | POA: Diagnosis not present

## 2021-05-04 DIAGNOSIS — I5042 Chronic combined systolic (congestive) and diastolic (congestive) heart failure: Secondary | ICD-10-CM | POA: Diagnosis not present

## 2021-05-04 DIAGNOSIS — J449 Chronic obstructive pulmonary disease, unspecified: Secondary | ICD-10-CM | POA: Diagnosis not present

## 2021-05-04 DIAGNOSIS — I130 Hypertensive heart and chronic kidney disease with heart failure and stage 1 through stage 4 chronic kidney disease, or unspecified chronic kidney disease: Secondary | ICD-10-CM | POA: Diagnosis not present

## 2021-05-04 DIAGNOSIS — I4891 Unspecified atrial fibrillation: Secondary | ICD-10-CM | POA: Diagnosis not present

## 2021-05-04 DIAGNOSIS — E1122 Type 2 diabetes mellitus with diabetic chronic kidney disease: Secondary | ICD-10-CM | POA: Diagnosis not present

## 2021-05-04 DIAGNOSIS — D631 Anemia in chronic kidney disease: Secondary | ICD-10-CM | POA: Diagnosis not present

## 2021-05-04 DIAGNOSIS — N189 Chronic kidney disease, unspecified: Secondary | ICD-10-CM | POA: Diagnosis not present

## 2021-05-06 DIAGNOSIS — J449 Chronic obstructive pulmonary disease, unspecified: Secondary | ICD-10-CM | POA: Diagnosis not present

## 2021-05-06 DIAGNOSIS — E1122 Type 2 diabetes mellitus with diabetic chronic kidney disease: Secondary | ICD-10-CM | POA: Diagnosis not present

## 2021-05-06 DIAGNOSIS — N189 Chronic kidney disease, unspecified: Secondary | ICD-10-CM | POA: Diagnosis not present

## 2021-05-06 DIAGNOSIS — D631 Anemia in chronic kidney disease: Secondary | ICD-10-CM | POA: Diagnosis not present

## 2021-05-06 DIAGNOSIS — I130 Hypertensive heart and chronic kidney disease with heart failure and stage 1 through stage 4 chronic kidney disease, or unspecified chronic kidney disease: Secondary | ICD-10-CM | POA: Diagnosis not present

## 2021-05-06 DIAGNOSIS — J9621 Acute and chronic respiratory failure with hypoxia: Secondary | ICD-10-CM | POA: Diagnosis not present

## 2021-05-06 DIAGNOSIS — I440 Atrioventricular block, first degree: Secondary | ICD-10-CM | POA: Diagnosis not present

## 2021-05-06 DIAGNOSIS — I5042 Chronic combined systolic (congestive) and diastolic (congestive) heart failure: Secondary | ICD-10-CM | POA: Diagnosis not present

## 2021-05-06 DIAGNOSIS — I4891 Unspecified atrial fibrillation: Secondary | ICD-10-CM | POA: Diagnosis not present

## 2021-05-07 DIAGNOSIS — I4891 Unspecified atrial fibrillation: Secondary | ICD-10-CM | POA: Diagnosis not present

## 2021-05-07 DIAGNOSIS — I440 Atrioventricular block, first degree: Secondary | ICD-10-CM | POA: Diagnosis not present

## 2021-05-07 DIAGNOSIS — I130 Hypertensive heart and chronic kidney disease with heart failure and stage 1 through stage 4 chronic kidney disease, or unspecified chronic kidney disease: Secondary | ICD-10-CM | POA: Diagnosis not present

## 2021-05-07 DIAGNOSIS — I5042 Chronic combined systolic (congestive) and diastolic (congestive) heart failure: Secondary | ICD-10-CM | POA: Diagnosis not present

## 2021-05-07 DIAGNOSIS — J449 Chronic obstructive pulmonary disease, unspecified: Secondary | ICD-10-CM | POA: Diagnosis not present

## 2021-05-07 DIAGNOSIS — N189 Chronic kidney disease, unspecified: Secondary | ICD-10-CM | POA: Diagnosis not present

## 2021-05-07 DIAGNOSIS — J9621 Acute and chronic respiratory failure with hypoxia: Secondary | ICD-10-CM | POA: Diagnosis not present

## 2021-05-07 DIAGNOSIS — E1122 Type 2 diabetes mellitus with diabetic chronic kidney disease: Secondary | ICD-10-CM | POA: Diagnosis not present

## 2021-05-07 DIAGNOSIS — D631 Anemia in chronic kidney disease: Secondary | ICD-10-CM | POA: Diagnosis not present

## 2021-05-08 ENCOUNTER — Ambulatory Visit: Payer: Medicare HMO | Admitting: Family

## 2021-05-08 DIAGNOSIS — I130 Hypertensive heart and chronic kidney disease with heart failure and stage 1 through stage 4 chronic kidney disease, or unspecified chronic kidney disease: Secondary | ICD-10-CM | POA: Diagnosis not present

## 2021-05-08 DIAGNOSIS — J9621 Acute and chronic respiratory failure with hypoxia: Secondary | ICD-10-CM | POA: Diagnosis not present

## 2021-05-08 DIAGNOSIS — J449 Chronic obstructive pulmonary disease, unspecified: Secondary | ICD-10-CM | POA: Diagnosis not present

## 2021-05-08 DIAGNOSIS — I5042 Chronic combined systolic (congestive) and diastolic (congestive) heart failure: Secondary | ICD-10-CM | POA: Diagnosis not present

## 2021-05-08 DIAGNOSIS — D631 Anemia in chronic kidney disease: Secondary | ICD-10-CM | POA: Diagnosis not present

## 2021-05-08 DIAGNOSIS — N189 Chronic kidney disease, unspecified: Secondary | ICD-10-CM | POA: Diagnosis not present

## 2021-05-08 DIAGNOSIS — E1122 Type 2 diabetes mellitus with diabetic chronic kidney disease: Secondary | ICD-10-CM | POA: Diagnosis not present

## 2021-05-08 DIAGNOSIS — I4891 Unspecified atrial fibrillation: Secondary | ICD-10-CM | POA: Diagnosis not present

## 2021-05-08 DIAGNOSIS — I440 Atrioventricular block, first degree: Secondary | ICD-10-CM | POA: Diagnosis not present

## 2021-05-10 DIAGNOSIS — E1122 Type 2 diabetes mellitus with diabetic chronic kidney disease: Secondary | ICD-10-CM | POA: Diagnosis not present

## 2021-05-10 DIAGNOSIS — I130 Hypertensive heart and chronic kidney disease with heart failure and stage 1 through stage 4 chronic kidney disease, or unspecified chronic kidney disease: Secondary | ICD-10-CM | POA: Diagnosis not present

## 2021-05-10 DIAGNOSIS — I440 Atrioventricular block, first degree: Secondary | ICD-10-CM | POA: Diagnosis not present

## 2021-05-10 DIAGNOSIS — I5042 Chronic combined systolic (congestive) and diastolic (congestive) heart failure: Secondary | ICD-10-CM | POA: Diagnosis not present

## 2021-05-10 DIAGNOSIS — J9621 Acute and chronic respiratory failure with hypoxia: Secondary | ICD-10-CM | POA: Diagnosis not present

## 2021-05-10 DIAGNOSIS — J449 Chronic obstructive pulmonary disease, unspecified: Secondary | ICD-10-CM | POA: Diagnosis not present

## 2021-05-10 DIAGNOSIS — N189 Chronic kidney disease, unspecified: Secondary | ICD-10-CM | POA: Diagnosis not present

## 2021-05-10 DIAGNOSIS — I4891 Unspecified atrial fibrillation: Secondary | ICD-10-CM | POA: Diagnosis not present

## 2021-05-10 DIAGNOSIS — D631 Anemia in chronic kidney disease: Secondary | ICD-10-CM | POA: Diagnosis not present

## 2021-05-14 DIAGNOSIS — I440 Atrioventricular block, first degree: Secondary | ICD-10-CM | POA: Diagnosis not present

## 2021-05-14 DIAGNOSIS — D631 Anemia in chronic kidney disease: Secondary | ICD-10-CM | POA: Diagnosis not present

## 2021-05-14 DIAGNOSIS — J9621 Acute and chronic respiratory failure with hypoxia: Secondary | ICD-10-CM | POA: Diagnosis not present

## 2021-05-14 DIAGNOSIS — N189 Chronic kidney disease, unspecified: Secondary | ICD-10-CM | POA: Diagnosis not present

## 2021-05-14 DIAGNOSIS — J449 Chronic obstructive pulmonary disease, unspecified: Secondary | ICD-10-CM | POA: Diagnosis not present

## 2021-05-14 DIAGNOSIS — I4891 Unspecified atrial fibrillation: Secondary | ICD-10-CM | POA: Diagnosis not present

## 2021-05-14 DIAGNOSIS — E1122 Type 2 diabetes mellitus with diabetic chronic kidney disease: Secondary | ICD-10-CM | POA: Diagnosis not present

## 2021-05-14 DIAGNOSIS — I130 Hypertensive heart and chronic kidney disease with heart failure and stage 1 through stage 4 chronic kidney disease, or unspecified chronic kidney disease: Secondary | ICD-10-CM | POA: Diagnosis not present

## 2021-05-14 DIAGNOSIS — I5042 Chronic combined systolic (congestive) and diastolic (congestive) heart failure: Secondary | ICD-10-CM | POA: Diagnosis not present

## 2021-05-15 DIAGNOSIS — I5042 Chronic combined systolic (congestive) and diastolic (congestive) heart failure: Secondary | ICD-10-CM | POA: Diagnosis not present

## 2021-05-15 DIAGNOSIS — I440 Atrioventricular block, first degree: Secondary | ICD-10-CM | POA: Diagnosis not present

## 2021-05-15 DIAGNOSIS — E1122 Type 2 diabetes mellitus with diabetic chronic kidney disease: Secondary | ICD-10-CM | POA: Diagnosis not present

## 2021-05-15 DIAGNOSIS — J9621 Acute and chronic respiratory failure with hypoxia: Secondary | ICD-10-CM | POA: Diagnosis not present

## 2021-05-15 DIAGNOSIS — D631 Anemia in chronic kidney disease: Secondary | ICD-10-CM | POA: Diagnosis not present

## 2021-05-15 DIAGNOSIS — I4891 Unspecified atrial fibrillation: Secondary | ICD-10-CM | POA: Diagnosis not present

## 2021-05-15 DIAGNOSIS — I130 Hypertensive heart and chronic kidney disease with heart failure and stage 1 through stage 4 chronic kidney disease, or unspecified chronic kidney disease: Secondary | ICD-10-CM | POA: Diagnosis not present

## 2021-05-15 DIAGNOSIS — J449 Chronic obstructive pulmonary disease, unspecified: Secondary | ICD-10-CM | POA: Diagnosis not present

## 2021-05-15 DIAGNOSIS — N189 Chronic kidney disease, unspecified: Secondary | ICD-10-CM | POA: Diagnosis not present

## 2021-05-16 DIAGNOSIS — Z09 Encounter for follow-up examination after completed treatment for conditions other than malignant neoplasm: Secondary | ICD-10-CM | POA: Diagnosis not present

## 2021-05-16 DIAGNOSIS — R0902 Hypoxemia: Secondary | ICD-10-CM | POA: Diagnosis not present

## 2021-05-16 DIAGNOSIS — Z794 Long term (current) use of insulin: Secondary | ICD-10-CM | POA: Diagnosis not present

## 2021-05-16 DIAGNOSIS — E119 Type 2 diabetes mellitus without complications: Secondary | ICD-10-CM | POA: Diagnosis not present

## 2021-05-16 DIAGNOSIS — I5022 Chronic systolic (congestive) heart failure: Secondary | ICD-10-CM | POA: Diagnosis not present

## 2021-05-20 DIAGNOSIS — N189 Chronic kidney disease, unspecified: Secondary | ICD-10-CM | POA: Diagnosis not present

## 2021-05-20 DIAGNOSIS — D631 Anemia in chronic kidney disease: Secondary | ICD-10-CM | POA: Diagnosis not present

## 2021-05-20 DIAGNOSIS — I130 Hypertensive heart and chronic kidney disease with heart failure and stage 1 through stage 4 chronic kidney disease, or unspecified chronic kidney disease: Secondary | ICD-10-CM | POA: Diagnosis not present

## 2021-05-20 DIAGNOSIS — I5042 Chronic combined systolic (congestive) and diastolic (congestive) heart failure: Secondary | ICD-10-CM | POA: Diagnosis not present

## 2021-05-20 DIAGNOSIS — J449 Chronic obstructive pulmonary disease, unspecified: Secondary | ICD-10-CM | POA: Diagnosis not present

## 2021-05-20 DIAGNOSIS — E1122 Type 2 diabetes mellitus with diabetic chronic kidney disease: Secondary | ICD-10-CM | POA: Diagnosis not present

## 2021-05-20 DIAGNOSIS — J9621 Acute and chronic respiratory failure with hypoxia: Secondary | ICD-10-CM | POA: Diagnosis not present

## 2021-05-20 DIAGNOSIS — I440 Atrioventricular block, first degree: Secondary | ICD-10-CM | POA: Diagnosis not present

## 2021-05-20 DIAGNOSIS — I4891 Unspecified atrial fibrillation: Secondary | ICD-10-CM | POA: Diagnosis not present

## 2021-05-21 DIAGNOSIS — D631 Anemia in chronic kidney disease: Secondary | ICD-10-CM | POA: Diagnosis not present

## 2021-05-21 DIAGNOSIS — I5042 Chronic combined systolic (congestive) and diastolic (congestive) heart failure: Secondary | ICD-10-CM | POA: Diagnosis not present

## 2021-05-21 DIAGNOSIS — J449 Chronic obstructive pulmonary disease, unspecified: Secondary | ICD-10-CM | POA: Diagnosis not present

## 2021-05-21 DIAGNOSIS — I4891 Unspecified atrial fibrillation: Secondary | ICD-10-CM | POA: Diagnosis not present

## 2021-05-21 DIAGNOSIS — I130 Hypertensive heart and chronic kidney disease with heart failure and stage 1 through stage 4 chronic kidney disease, or unspecified chronic kidney disease: Secondary | ICD-10-CM | POA: Diagnosis not present

## 2021-05-21 DIAGNOSIS — N189 Chronic kidney disease, unspecified: Secondary | ICD-10-CM | POA: Diagnosis not present

## 2021-05-21 DIAGNOSIS — E1122 Type 2 diabetes mellitus with diabetic chronic kidney disease: Secondary | ICD-10-CM | POA: Diagnosis not present

## 2021-05-21 DIAGNOSIS — J9621 Acute and chronic respiratory failure with hypoxia: Secondary | ICD-10-CM | POA: Diagnosis not present

## 2021-05-21 DIAGNOSIS — I440 Atrioventricular block, first degree: Secondary | ICD-10-CM | POA: Diagnosis not present

## 2021-05-27 DIAGNOSIS — N189 Chronic kidney disease, unspecified: Secondary | ICD-10-CM | POA: Diagnosis not present

## 2021-05-27 DIAGNOSIS — I130 Hypertensive heart and chronic kidney disease with heart failure and stage 1 through stage 4 chronic kidney disease, or unspecified chronic kidney disease: Secondary | ICD-10-CM | POA: Diagnosis not present

## 2021-05-27 DIAGNOSIS — J9621 Acute and chronic respiratory failure with hypoxia: Secondary | ICD-10-CM | POA: Diagnosis not present

## 2021-05-27 DIAGNOSIS — D631 Anemia in chronic kidney disease: Secondary | ICD-10-CM | POA: Diagnosis not present

## 2021-05-27 DIAGNOSIS — I5042 Chronic combined systolic (congestive) and diastolic (congestive) heart failure: Secondary | ICD-10-CM | POA: Diagnosis not present

## 2021-05-27 DIAGNOSIS — E1122 Type 2 diabetes mellitus with diabetic chronic kidney disease: Secondary | ICD-10-CM | POA: Diagnosis not present

## 2021-05-27 DIAGNOSIS — I4891 Unspecified atrial fibrillation: Secondary | ICD-10-CM | POA: Diagnosis not present

## 2021-05-27 DIAGNOSIS — J449 Chronic obstructive pulmonary disease, unspecified: Secondary | ICD-10-CM | POA: Diagnosis not present

## 2021-05-27 DIAGNOSIS — I440 Atrioventricular block, first degree: Secondary | ICD-10-CM | POA: Diagnosis not present

## 2021-05-28 DIAGNOSIS — J9621 Acute and chronic respiratory failure with hypoxia: Secondary | ICD-10-CM | POA: Diagnosis not present

## 2021-05-28 DIAGNOSIS — E1122 Type 2 diabetes mellitus with diabetic chronic kidney disease: Secondary | ICD-10-CM | POA: Diagnosis not present

## 2021-05-28 DIAGNOSIS — I440 Atrioventricular block, first degree: Secondary | ICD-10-CM | POA: Diagnosis not present

## 2021-05-28 DIAGNOSIS — J449 Chronic obstructive pulmonary disease, unspecified: Secondary | ICD-10-CM | POA: Diagnosis not present

## 2021-05-28 DIAGNOSIS — I5042 Chronic combined systolic (congestive) and diastolic (congestive) heart failure: Secondary | ICD-10-CM | POA: Diagnosis not present

## 2021-05-28 DIAGNOSIS — I130 Hypertensive heart and chronic kidney disease with heart failure and stage 1 through stage 4 chronic kidney disease, or unspecified chronic kidney disease: Secondary | ICD-10-CM | POA: Diagnosis not present

## 2021-05-28 DIAGNOSIS — I4891 Unspecified atrial fibrillation: Secondary | ICD-10-CM | POA: Diagnosis not present

## 2021-05-28 DIAGNOSIS — N189 Chronic kidney disease, unspecified: Secondary | ICD-10-CM | POA: Diagnosis not present

## 2021-05-28 DIAGNOSIS — D631 Anemia in chronic kidney disease: Secondary | ICD-10-CM | POA: Diagnosis not present

## 2021-06-01 DIAGNOSIS — I509 Heart failure, unspecified: Secondary | ICD-10-CM | POA: Diagnosis not present

## 2021-06-03 DIAGNOSIS — I4891 Unspecified atrial fibrillation: Secondary | ICD-10-CM | POA: Diagnosis not present

## 2021-06-03 DIAGNOSIS — I440 Atrioventricular block, first degree: Secondary | ICD-10-CM | POA: Diagnosis not present

## 2021-06-03 DIAGNOSIS — I5042 Chronic combined systolic (congestive) and diastolic (congestive) heart failure: Secondary | ICD-10-CM | POA: Diagnosis not present

## 2021-06-03 DIAGNOSIS — J449 Chronic obstructive pulmonary disease, unspecified: Secondary | ICD-10-CM | POA: Diagnosis not present

## 2021-06-03 DIAGNOSIS — N189 Chronic kidney disease, unspecified: Secondary | ICD-10-CM | POA: Diagnosis not present

## 2021-06-03 DIAGNOSIS — I130 Hypertensive heart and chronic kidney disease with heart failure and stage 1 through stage 4 chronic kidney disease, or unspecified chronic kidney disease: Secondary | ICD-10-CM | POA: Diagnosis not present

## 2021-06-03 DIAGNOSIS — D631 Anemia in chronic kidney disease: Secondary | ICD-10-CM | POA: Diagnosis not present

## 2021-06-03 DIAGNOSIS — J9621 Acute and chronic respiratory failure with hypoxia: Secondary | ICD-10-CM | POA: Diagnosis not present

## 2021-06-03 DIAGNOSIS — E1122 Type 2 diabetes mellitus with diabetic chronic kidney disease: Secondary | ICD-10-CM | POA: Diagnosis not present

## 2021-06-05 DIAGNOSIS — I4891 Unspecified atrial fibrillation: Secondary | ICD-10-CM | POA: Diagnosis not present

## 2021-06-05 DIAGNOSIS — I440 Atrioventricular block, first degree: Secondary | ICD-10-CM | POA: Diagnosis not present

## 2021-06-05 DIAGNOSIS — I130 Hypertensive heart and chronic kidney disease with heart failure and stage 1 through stage 4 chronic kidney disease, or unspecified chronic kidney disease: Secondary | ICD-10-CM | POA: Diagnosis not present

## 2021-06-05 DIAGNOSIS — I5042 Chronic combined systolic (congestive) and diastolic (congestive) heart failure: Secondary | ICD-10-CM | POA: Diagnosis not present

## 2021-06-05 DIAGNOSIS — E1122 Type 2 diabetes mellitus with diabetic chronic kidney disease: Secondary | ICD-10-CM | POA: Diagnosis not present

## 2021-06-05 DIAGNOSIS — N189 Chronic kidney disease, unspecified: Secondary | ICD-10-CM | POA: Diagnosis not present

## 2021-06-05 DIAGNOSIS — J9621 Acute and chronic respiratory failure with hypoxia: Secondary | ICD-10-CM | POA: Diagnosis not present

## 2021-06-05 DIAGNOSIS — D631 Anemia in chronic kidney disease: Secondary | ICD-10-CM | POA: Diagnosis not present

## 2021-06-05 DIAGNOSIS — J449 Chronic obstructive pulmonary disease, unspecified: Secondary | ICD-10-CM | POA: Diagnosis not present

## 2021-06-11 DIAGNOSIS — J9621 Acute and chronic respiratory failure with hypoxia: Secondary | ICD-10-CM | POA: Diagnosis not present

## 2021-06-11 DIAGNOSIS — I4891 Unspecified atrial fibrillation: Secondary | ICD-10-CM | POA: Diagnosis not present

## 2021-06-11 DIAGNOSIS — I440 Atrioventricular block, first degree: Secondary | ICD-10-CM | POA: Diagnosis not present

## 2021-06-11 DIAGNOSIS — I5042 Chronic combined systolic (congestive) and diastolic (congestive) heart failure: Secondary | ICD-10-CM | POA: Diagnosis not present

## 2021-06-11 DIAGNOSIS — E1122 Type 2 diabetes mellitus with diabetic chronic kidney disease: Secondary | ICD-10-CM | POA: Diagnosis not present

## 2021-06-11 DIAGNOSIS — D631 Anemia in chronic kidney disease: Secondary | ICD-10-CM | POA: Diagnosis not present

## 2021-06-11 DIAGNOSIS — N189 Chronic kidney disease, unspecified: Secondary | ICD-10-CM | POA: Diagnosis not present

## 2021-06-11 DIAGNOSIS — J449 Chronic obstructive pulmonary disease, unspecified: Secondary | ICD-10-CM | POA: Diagnosis not present

## 2021-06-11 DIAGNOSIS — I130 Hypertensive heart and chronic kidney disease with heart failure and stage 1 through stage 4 chronic kidney disease, or unspecified chronic kidney disease: Secondary | ICD-10-CM | POA: Diagnosis not present

## 2021-06-13 DIAGNOSIS — E119 Type 2 diabetes mellitus without complications: Secondary | ICD-10-CM | POA: Diagnosis not present

## 2021-06-13 DIAGNOSIS — G40909 Epilepsy, unspecified, not intractable, without status epilepticus: Secondary | ICD-10-CM | POA: Diagnosis not present

## 2021-06-13 DIAGNOSIS — I4891 Unspecified atrial fibrillation: Secondary | ICD-10-CM | POA: Diagnosis not present

## 2021-06-17 ENCOUNTER — Other Ambulatory Visit: Payer: Self-pay

## 2021-06-17 ENCOUNTER — Emergency Department: Payer: Medicare HMO

## 2021-06-17 ENCOUNTER — Emergency Department
Admission: EM | Admit: 2021-06-17 | Discharge: 2021-06-17 | Disposition: A | Discharge status: Home / Self Care | Payer: Medicare HMO | Attending: Emergency Medicine | Admitting: Emergency Medicine

## 2021-06-17 DIAGNOSIS — E1142 Type 2 diabetes mellitus with diabetic polyneuropathy: Secondary | ICD-10-CM | POA: Insufficient documentation

## 2021-06-17 DIAGNOSIS — M47812 Spondylosis without myelopathy or radiculopathy, cervical region: Secondary | ICD-10-CM | POA: Diagnosis not present

## 2021-06-17 DIAGNOSIS — R918 Other nonspecific abnormal finding of lung field: Secondary | ICD-10-CM | POA: Diagnosis not present

## 2021-06-17 DIAGNOSIS — Y9301 Activity, walking, marching and hiking: Secondary | ICD-10-CM | POA: Diagnosis not present

## 2021-06-17 DIAGNOSIS — Z85038 Personal history of other malignant neoplasm of large intestine: Secondary | ICD-10-CM | POA: Diagnosis not present

## 2021-06-17 DIAGNOSIS — R609 Edema, unspecified: Secondary | ICD-10-CM | POA: Diagnosis not present

## 2021-06-17 DIAGNOSIS — Z794 Long term (current) use of insulin: Secondary | ICD-10-CM | POA: Diagnosis not present

## 2021-06-17 DIAGNOSIS — Z955 Presence of coronary angioplasty implant and graft: Secondary | ICD-10-CM | POA: Insufficient documentation

## 2021-06-17 DIAGNOSIS — Z7901 Long term (current) use of anticoagulants: Secondary | ICD-10-CM | POA: Insufficient documentation

## 2021-06-17 DIAGNOSIS — I4891 Unspecified atrial fibrillation: Secondary | ICD-10-CM | POA: Insufficient documentation

## 2021-06-17 DIAGNOSIS — Z950 Presence of cardiac pacemaker: Secondary | ICD-10-CM | POA: Insufficient documentation

## 2021-06-17 DIAGNOSIS — J45909 Unspecified asthma, uncomplicated: Secondary | ICD-10-CM | POA: Insufficient documentation

## 2021-06-17 DIAGNOSIS — Z96642 Presence of left artificial hip joint: Secondary | ICD-10-CM | POA: Diagnosis not present

## 2021-06-17 DIAGNOSIS — M19011 Primary osteoarthritis, right shoulder: Secondary | ICD-10-CM | POA: Diagnosis not present

## 2021-06-17 DIAGNOSIS — N189 Chronic kidney disease, unspecified: Secondary | ICD-10-CM | POA: Diagnosis not present

## 2021-06-17 DIAGNOSIS — Y92009 Unspecified place in unspecified non-institutional (private) residence as the place of occurrence of the external cause: Secondary | ICD-10-CM | POA: Insufficient documentation

## 2021-06-17 DIAGNOSIS — S0990XA Unspecified injury of head, initial encounter: Secondary | ICD-10-CM | POA: Diagnosis not present

## 2021-06-17 DIAGNOSIS — Z79899 Other long term (current) drug therapy: Secondary | ICD-10-CM | POA: Diagnosis not present

## 2021-06-17 DIAGNOSIS — R0902 Hypoxemia: Secondary | ICD-10-CM | POA: Diagnosis not present

## 2021-06-17 DIAGNOSIS — R52 Pain, unspecified: Secondary | ICD-10-CM | POA: Diagnosis not present

## 2021-06-17 DIAGNOSIS — W19XXXA Unspecified fall, initial encounter: Secondary | ICD-10-CM | POA: Diagnosis not present

## 2021-06-17 DIAGNOSIS — Z96643 Presence of artificial hip joint, bilateral: Secondary | ICD-10-CM | POA: Diagnosis not present

## 2021-06-17 DIAGNOSIS — S99912A Unspecified injury of left ankle, initial encounter: Secondary | ICD-10-CM | POA: Diagnosis not present

## 2021-06-17 DIAGNOSIS — I130 Hypertensive heart and chronic kidney disease with heart failure and stage 1 through stage 4 chronic kidney disease, or unspecified chronic kidney disease: Secondary | ICD-10-CM | POA: Insufficient documentation

## 2021-06-17 DIAGNOSIS — S82432A Displaced oblique fracture of shaft of left fibula, initial encounter for closed fracture: Secondary | ICD-10-CM | POA: Insufficient documentation

## 2021-06-17 DIAGNOSIS — M25562 Pain in left knee: Secondary | ICD-10-CM | POA: Diagnosis not present

## 2021-06-17 DIAGNOSIS — I10 Essential (primary) hypertension: Secondary | ICD-10-CM | POA: Diagnosis not present

## 2021-06-17 DIAGNOSIS — I5023 Acute on chronic systolic (congestive) heart failure: Secondary | ICD-10-CM | POA: Insufficient documentation

## 2021-06-17 DIAGNOSIS — W010XXA Fall on same level from slipping, tripping and stumbling without subsequent striking against object, initial encounter: Secondary | ICD-10-CM | POA: Diagnosis not present

## 2021-06-17 DIAGNOSIS — Z96651 Presence of right artificial knee joint: Secondary | ICD-10-CM | POA: Insufficient documentation

## 2021-06-17 DIAGNOSIS — I25119 Atherosclerotic heart disease of native coronary artery with unspecified angina pectoris: Secondary | ICD-10-CM | POA: Insufficient documentation

## 2021-06-17 LAB — BASIC METABOLIC PANEL
Anion gap: 7 (ref 5–15)
BUN: 19 mg/dL (ref 8–23)
CO2: 28 mmol/L (ref 22–32)
Calcium: 9.3 mg/dL (ref 8.9–10.3)
Chloride: 98 mmol/L (ref 98–111)
Creatinine, Ser: 0.7 mg/dL (ref 0.44–1.00)
GFR, Estimated: >60 mL/min (ref >60)
Glucose, Bld: 180 mg/dL — ABNORMAL HIGH (ref 70–99)
Potassium: 3.9 mmol/L (ref 3.5–5.1)
Sodium: 133 mmol/L — ABNORMAL LOW (ref 135–145)

## 2021-06-17 LAB — CBC WITH DIFFERENTIAL/PLATELET
Abs Immature Granulocytes: 0.05 10*3/uL (ref 0.00–0.07)
Basophils Absolute: 0 10*3/uL (ref 0.0–0.1)
Basophils Relative: 0 %
Eosinophils Absolute: 0 10*3/uL (ref 0.0–0.5)
Eosinophils Relative: 0 %
HCT: 31 % — ABNORMAL LOW (ref 36.0–46.0)
Hemoglobin: 9.6 g/dL — ABNORMAL LOW (ref 12.0–15.0)
Immature Granulocytes: 1 %
Lymphocytes Relative: 8 %
Lymphs Abs: 0.8 10*3/uL (ref 0.7–4.0)
MCH: 25.5 pg — ABNORMAL LOW (ref 26.0–34.0)
MCHC: 31 g/dL (ref 30.0–36.0)
MCV: 82.4 fL (ref 80.0–100.0)
Monocytes Absolute: 1.5 10*3/uL — ABNORMAL HIGH (ref 0.1–1.0)
Monocytes Relative: 14 %
Neutro Abs: 8.5 10*3/uL — ABNORMAL HIGH (ref 1.7–7.7)
Neutrophils Relative %: 77 %
Platelets: 291 10*3/uL (ref 150–400)
RBC: 3.76 MIL/uL — ABNORMAL LOW (ref 3.87–5.11)
RDW: 17.9 % — ABNORMAL HIGH (ref 11.5–15.5)
WBC: 10.9 10*3/uL — ABNORMAL HIGH (ref 4.0–10.5)
nRBC: 0 % (ref 0.0–0.2)

## 2021-06-17 MED ORDER — IPRATROPIUM-ALBUTEROL 0.5-2.5 (3) MG/3ML IN SOLN
3.0000 mL | Freq: Once | RESPIRATORY_TRACT | Status: AC
  Administered 2021-06-17: 3 mL via RESPIRATORY_TRACT
  Filled 2021-06-17: qty 3

## 2021-06-17 MED ORDER — ONDANSETRON 4 MG PO TBDP
4.0000 mg | ORAL_TABLET | Freq: Once | ORAL | Status: AC
  Administered 2021-06-17: 4 mg via ORAL
  Filled 2021-06-17: qty 1

## 2021-06-17 MED ORDER — OXYCODONE HCL 5 MG PO TABS
2.5000 mg | ORAL_TABLET | Freq: Once | ORAL | Status: AC
  Administered 2021-06-17: 2.5 mg via ORAL
  Filled 2021-06-17: qty 1

## 2021-06-17 MED ORDER — ACETAMINOPHEN 500 MG PO TABS
1000.0000 mg | ORAL_TABLET | Freq: Once | ORAL | Status: AC
  Administered 2021-06-17: 1000 mg via ORAL
  Filled 2021-06-17: qty 2

## 2021-06-17 NOTE — Discharge Instructions (Signed)
Wear the boot at all times when moving/walking, ideally when resting but can take off for small periods of time as needed  Take TYLENOL 1000 mg (2 extra-strength tablets) every 6 hours for the next 2-3 days, then as needed  Elevate the leg/ankle as often as possible for the next 2-3 days  Apply ice as needed for 2 days

## 2021-06-17 NOTE — ED Provider Notes (Addendum)
[facility] Emergency Department Provider Note  ____________________________________________   Event Date/Time   First MD Initiated Contact with Patient 06/17/21 1036     (approximate)  I have reviewed the triage vital signs and the nursing notes.   HISTORY  Chief Complaint Fall    HPI Amber [name] is a 87 y.o. female here with left ankle pain.  The patient states that she was walking in her house yesterday when she tripped, causing her left ankle to twist.  She experienced acute onset of severe pain in her ankle.  She went back to bed.  Since then, she has had progressive worsening bruising, swelling, and ecchymosis.  She has had difficulty weightbearing due to the pain today.  No other injuries.  She did not fall or hit her head.  No hip pain.  She is on blood thinners.  Denies history of previous injury to this ankle.  She normally uses a walker or cane at baseline.  Lives with her family.    Past Medical History:  Diagnosis Date   Anemia    Anxiety    Arthritis    Benign neoplasm of colon    CHF (congestive heart failure) (HCC)    Chronic kidney disease    Chronic pain    Complication of anesthesia 1980's   hard time waking up   Coronary artery disease with unspecified angina pectoris    Cough    Diabetes mellitus without complication ([place])    Essential hypertension    GERD (gastroesophageal reflux disease)    High risk medication use    Hyperlipidemia    Plantar fascial fibromatosis    Presence of permanent cardiac pacemaker    Shortness of breath dyspnea     Patient Active Problem List   Diagnosis Date Noted   AMS (altered mental status) 04/12/2021   Shortness of breath 04/11/2021   Altered mental status 04/11/2021   History of atrial fibrillation 02/25/2020   Pacemaker 02/25/2020   Acute on chronic diastolic CHF (congestive heart failure) ([place]) 02/25/2020   Complete heart block ([place]) 01/24/2020   Injury    S/P ORIF (open  reduction internal fixation) fracture    Hypotension due to hypovolemia    Closed displaced fracture of right tibial tuberosity    Pain    Syncope and collapse 12/06/2019   Status post total hip replacement, left 02/09/2018   Primary osteoarthritis of left hip 01/06/2018   Chronic pain syndrome 04/09/2017   Other specified health status 04/08/2017   Disorder of bone, unspecified 04/08/2017   Long term current use of opiate analgesic 04/08/2017   Chronic low back pain (Primary Area of Pain) (Bilateral) (L>R) 04/08/2017   Chronic pain of both lower extremities  (Tertiary Area of Pain) 04/08/2017   Chronic groin pain (Secondary Area of Pain) 04/08/2017   Chronic neck pain (Fourth Area of Pain) (Bilateral) (L>R) 04/08/2017   Chronic sacroiliac joint pain 04/08/2017   Other long term (current) drug therapy 04/08/2017   Syncope 02/09/2017   Asthmatic bronchitis 01/26/2017   Fall 10/06/2016   Anxiety 08/08/2016   Acute on chronic respiratory failure with hypoxia ([place]) 07/26/2016   Acute on chronic systolic CHF (congestive heart failure) ([place]) 07/26/2016   TIA (transient ischemic attack) 07/25/2015   Sick sinus syndrome ([place]) 03/14/2015   CAD (coronary artery disease) 02/23/2015   Chronic heart failure ([place]) 01/05/2015   Fracture dislocation of ankle 07/27/2014   IDDM (insulin dependent diabetes mellitus) 07/27/2014  Essential hypertension 07/27/2014   Multiple rib fractures 07/27/2014   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   Anemia 12/06/2013   Type 2 diabetes mellitus with diabetic polyneuropathy, with long-term current use of insulin ([place]) 12/06/2013    Past Surgical History:  Procedure Laterality Date   ABDOMINAL HYSTERECTOMY     BACK SURGERY  1960's   cage and screws in lower back   CATARACT EXTRACTION W/ INTRAOCULAR LENS  IMPLANT, BILATERAL Bilateral    CORONARY ARTERY BYPASS GRAFT  2008   triple   HARDWARE REMOVAL Right 12/08/2019   Procedure: HARDWARE REMOVAL;  Surgeon: Hessie Knows, MD;  Location: [facility];  Service: Orthopedics;  Laterality: Right;   JOINT REPLACEMENT Right    hip and knee   ORIF ANKLE FRACTURE Right 07/27/2014   Procedure: OPEN REDUCTION INTERNAL FIXATION (ORIF) ANKLE FRACTURE;  Surgeon: Alta Corning, MD;  Location: [place];  Service: Orthopedics;  Laterality: Right;   ORIF TIBIA FRACTURE Right 12/08/2019   Procedure: OPEN REDUCTION INTERNAL FIXATION (ORIF) TIBIA FRACTURE;  Surgeon: Hessie Knows, MD;  Location: [facility];  Service: Orthopedics;  Laterality: Right;   PACEMAKER INSERTION Left 03/14/2015   Procedure: INSERTION PACEMAKER;  Surgeon: Isaias Cowman, MD;  Location: [facility];  Service: Cardiovascular;  Laterality: Left;   PACEMAKER LEADLESS INSERTION N/A 01/24/2020   Procedure: PACEMAKER LEADLESS INSERTION;  Surgeon: Isaias Cowman, MD;  Location: [place] [facility];  Service: Cardiovascular;  Laterality: N/A;   ROTATOR CUFF REPAIR Left    TOTAL HIP ARTHROPLASTY Right    TOTAL HIP ARTHROPLASTY Left 02/09/2018   Procedure: TOTAL HIP ARTHROPLASTY ANTERIOR APPROACH;  Surgeon: Hessie Knows, MD;  Location: [facility];  Service: Orthopedics;  Laterality: Left;   TOTAL KNEE ARTHROPLASTY Right    VEIN LIGATION AND STRIPPING      Prior to Admission medications   Medication Sig Start Date End Date Taking? Authorizing Provider  acetaminophen (TYLENOL) 500 MG tablet Take 1 tablet (500 mg total) by mouth every 6 (six) hours as needed for mild pain or headache. 04/16/21   Fritzi Mandes, MD  albuterol (PROVENTIL HFA;VENTOLIN HFA) 108 (90 Base) MCG/ACT inhaler Inhale 2 puffs into the lungs every 6 (six) hours as needed for wheezing or shortness of breath. 01/28/17   Loletha Grayer, MD  apixaban (ELIQUIS) 5 MG TABS tablet Take 5 mg by mouth 2 (two) times daily.    [provider]  benzonatate (TESSALON) 200 MG capsule Take 200 mg by mouth 3 (three) times daily as needed for cough. 02/23/21   [provider]  calcium-vitamin D  (OSCAL WITH D) 500-200 MG-UNIT tablet Take 1 tablet by mouth daily.    [provider]  carvedilol (COREG) 6.25 MG tablet Take 1 tablet (6.25 mg total) by mouth 2 (two) times daily with a meal. 01/28/17   Loletha Grayer, MD  furosemide (LASIX) 20 MG tablet Take 1 tablet (20 mg total) by mouth 2 (two) times daily. Patient taking differently: Take 20 mg by mouth 2 (two) times daily. 02/27/20   Para Skeans, MD  insulin glargine (LANTUS) 100 UNIT/ML injection Inject 0.1 mLs (10 Units total) into the skin daily. 04/16/21   Fritzi Mandes, MD  levETIRAcetam (KEPPRA) 250 MG tablet Take 1 tablet (250 mg total) by mouth 2 (two) times daily. 04/16/21   Fritzi Mandes, MD  lisinopril (ZESTRIL) 10 MG tablet Take 10 mg by mouth daily. 02/07/21   [provider]  Multiple Vitamins-Minerals (PRESERVISION AREDS 2) CAPS Take 1 capsule by  mouth daily.    [provider]  omeprazole (PRILOSEC) 40 MG capsule Take 40 mg by mouth daily.     [provider]  pravastatin (PRAVACHOL) 40 MG tablet Take 40 mg by mouth at bedtime.    [provider]  vitamin B-12 1000 MCG tablet Take 1 tablet (1,000 mcg total) by mouth daily. 04/17/21   Fritzi Mandes, MD    Allergies Ambien [zolpidem tartrate], Penicillins, Iodine, Succinylcholine, Etodolac, and Nsaids  Family History  Problem Relation Age of Onset   Heart attack Mother    Heart disease Father    Alzheimer's disease Sister    Cervical cancer Sister    Heart failure Son     Social History Social History   Tobacco Use   Smoking status: Never   Smokeless tobacco: Never  Vaping Use   Vaping Use: Never used  Substance Use Topics   Alcohol use: No   Drug use: No    Review of Systems  Review of Systems  Constitutional:  Negative for chills and fever.  HENT:  Negative for sore throat.   Respiratory:  Negative for shortness of breath.   Cardiovascular:  Negative for chest pain.  Gastrointestinal:  Negative for abdominal  pain.  Genitourinary:  Negative for flank pain.  Musculoskeletal:  Positive for arthralgias and gait problem. Negative for neck pain.  Skin:  Negative for rash and wound.  Allergic/Immunologic: Negative for immunocompromised state.  Neurological:  Negative for weakness and numbness.  Hematological:  Does not bruise/bleed easily.  All other systems reviewed and are negative.   ____________________________________________  PHYSICAL EXAM:      VITAL SIGNS: ED Triage Vitals  Enc Vitals Group     BP 06/17/21 1006 (!) 153/54     Pulse Rate 06/17/21 1006 61     Resp 06/17/21 1006 16     Temp 06/17/21 1006 98.5 F (36.9 C)     Temp Source 06/17/21 1006 Oral     SpO2 06/17/21 1006 100 %     Weight 06/17/21 1003 169 lb 5 oz (76.8 kg)     Height 06/17/21 1003 4\' 11"  (1.499 m)     Head Circumference --      Peak Flow --      Pain Score 06/17/21 1002 3     Pain Loc --      Pain Edu? --      Excl. in [place]? --      Physical Exam Vitals and nursing note reviewed.  Constitutional:      General: She is not in acute distress.    Appearance: She is well-developed.  HENT:     Head: Normocephalic and atraumatic.  Eyes:     Conjunctiva/sclera: Conjunctivae normal.  Cardiovascular:     Rate and Rhythm: Normal rate and regular rhythm.     Heart sounds: Normal heart sounds. No murmur heard.   No friction rub.  Pulmonary:     Effort: Pulmonary effort is normal. No respiratory distress.     Breath sounds: Normal breath sounds. No wheezing or rales.  Abdominal:     General: There is no distension.     Palpations: Abdomen is soft.     Tenderness: There is no abdominal tenderness.  Musculoskeletal:     Cervical back: Neck supple.     Comments: Moderate bruising and ecchymosis to the left ankle, particular along the lateral malleolus.  No deformity.  No open wounds.  Skin:    General: Skin is  warm.     Capillary Refill: Capillary refill takes less than 2 seconds.  Neurological:     Mental  Status: She is alert and oriented to person, place, and time.     Motor: No abnormal muscle tone.      ____________________________________________   LABS (all labs ordered are listed, but only abnormal results are displayed)  Labs Reviewed  CBC WITH DIFFERENTIAL/PLATELET - Abnormal; Notable for the following components:      Result Value   WBC 10.9 (*)    RBC 3.76 (*)    Hemoglobin 9.6 (*)    HCT 31.0 (*)    MCH 25.5 (*)    RDW 17.9 (*)    Neutro Abs 8.5 (*)    Monocytes Absolute 1.5 (*)    All other components within normal limits  BASIC METABOLIC PANEL - Abnormal; Notable for the following components:   Sodium 133 (*)    Glucose, Bld 180 (*)    All other components within normal limits    ____________________________________________  EKG: Junctional rhythm, ventricular rate 60.  QRS 26, QTc 47.  No acute ST elevations or depressions.  No significant change from compared to prior ________________________________________  RADIOLOGY All imaging, including plain films, CT scans, and ultrasounds, independently reviewed by me, and interpretations confirmed via formal radiology reads.  ED MD interpretation:   Chest x-ray: Chronic changes, no acute abnormality DG left ankle: Minimally displaced distal fibular shaft fracture CT head/C-spine: No acute intracranial abnormality DG knee left: Severe degenerative joint changes  Official radiology report(s): DG Ankle Complete Left  Result Date: 06/17/2021 CLINICAL DATA:  Left ankle injury after a fall, initial encounter. EXAM: LEFT ANKLE COMPLETE - 3+ VIEW COMPARISON:  None. FINDINGS: There is a minimally displaced and obliquely oriented fracture of the distal femoral shaft. Ankle mortise is intact. No additional evidence of an acute fracture. IMPRESSION: Minimally displaced distal fibular shaft fracture. Electronically Signed   By: Lorin Picket M.D.   On: 06/17/2021 11:16   CT HEAD WO CONTRAST (5MM)  Result Date:  06/17/2021 CLINICAL DATA:  Head trauma, moderate-severe; Neck trauma (Age >= 65y) EXAM: CT HEAD WITHOUT CONTRAST CT CERVICAL SPINE WITHOUT CONTRAST TECHNIQUE: Multidetector CT imaging of the head and cervical spine was performed following the standard protocol without intravenous contrast. Multiplanar CT image reconstructions of the cervical spine were also generated. COMPARISON:  04/12/2021, 07/27/2014 FINDINGS: CT HEAD FINDINGS Brain: No evidence of acute infarction, hemorrhage, hydrocephalus, extra-axial collection or mass lesion/mass effect. Moderate low-density changes within the periventricular and subcortical white matter compatible with chronic microvascular ischemic change. Mild diffuse cerebral volume loss. Vascular: Atherosclerotic calcifications involving the large vessels of the skull base. No unexpected hyperdense vessel. Skull: Normal. Negative for fracture or focal lesion. Sinuses/Orbits: No acute finding. Other: Negative for scalp hematoma. CT CERVICAL SPINE FINDINGS Alignment: Facet joints are aligned without dislocation or traumatic listhesis. Dens and lateral masses are aligned. Grade 1 anterolisthesis of C3 on C4 and C4 on C5 secondary to degenerative facet arthropathy. Skull base and vertebrae: No acute fracture. No primary bone lesion or focal pathologic process. Soft tissues and spinal canal: No prevertebral fluid or swelling. No visible canal hematoma. Disc levels: Advanced multilevel degenerative disc disease, most pronounced at the C5-6 and C6-7 levels. Prominent bilateral facet and uncovertebral arthropathy. Findings have progressed from 2016. Upper chest: Negative. Other: None. IMPRESSION: 1. No acute intracranial findings. 2. No evidence of acute fracture or traumatic listhesis of the cervical spine. 3. Advanced multilevel cervical spondylosis,  progressed from 2016. Electronically Signed   By: Davina Poke D.O.   On: 06/17/2021 11:29   CT Cervical Spine Wo Contrast  Result  Date: 06/17/2021 CLINICAL DATA:  Head trauma, moderate-severe; Neck trauma (Age >= 65y) EXAM: CT HEAD WITHOUT CONTRAST CT CERVICAL SPINE WITHOUT CONTRAST TECHNIQUE: Multidetector CT imaging of the head and cervical spine was performed following the standard protocol without intravenous contrast. Multiplanar CT image reconstructions of the cervical spine were also generated. COMPARISON:  04/12/2021, 07/27/2014 FINDINGS: CT HEAD FINDINGS Brain: No evidence of acute infarction, hemorrhage, hydrocephalus, extra-axial collection or mass lesion/mass effect. Moderate low-density changes within the periventricular and subcortical white matter compatible with chronic microvascular ischemic change. Mild diffuse cerebral volume loss. Vascular: Atherosclerotic calcifications involving the large vessels of the skull base. No unexpected hyperdense vessel. Skull: Normal. Negative for fracture or focal lesion. Sinuses/Orbits: No acute finding. Other: Negative for scalp hematoma. CT CERVICAL SPINE FINDINGS Alignment: Facet joints are aligned without dislocation or traumatic listhesis. Dens and lateral masses are aligned. Grade 1 anterolisthesis of C3 on C4 and C4 on C5 secondary to degenerative facet arthropathy. Skull base and vertebrae: No acute fracture. No primary bone lesion or focal pathologic process. Soft tissues and spinal canal: No prevertebral fluid or swelling. No visible canal hematoma. Disc levels: Advanced multilevel degenerative disc disease, most pronounced at the C5-6 and C6-7 levels. Prominent bilateral facet and uncovertebral arthropathy. Findings have progressed from 2016. Upper chest: Negative. Other: None. IMPRESSION: 1. No acute intracranial findings. 2. No evidence of acute fracture or traumatic listhesis of the cervical spine. 3. Advanced multilevel cervical spondylosis, progressed from 2016. Electronically Signed   By: Davina Poke D.O.   On: 06/17/2021 11:29   DG Chest Portable 1 View  Result  Date: 06/17/2021 CLINICAL DATA:  Fall EXAM: PORTABLE CHEST 1 VIEW COMPARISON:  04/11/2021 FINDINGS: Unchanged cardiomediastinal silhouette with single lead pacemaker. Mild bronchial wall thickening and chronic reticular opacities bilaterally. No new airspace disease. No large pleural effusion. No visible pneumothorax. No acute osseous abnormality. Multiple old left anterior rib injuries. Severe right glenohumeral osteoarthritis. Prior and tonic left shoulder arthroplasty and plate fixation of the humeral shaft. IMPRESSION: Chronic parenchymal findings. No evidence of acute cardiopulmonary disease. Electronically Signed   By: Maurine Simmering M.D.   On: 06/17/2021 11:14   DG Knee Complete 4 Views Left  Result Date: 06/17/2021 CLINICAL DATA:  Left knee pain after fall. EXAM: LEFT KNEE - COMPLETE 4+ VIEW COMPARISON:  None. FINDINGS: No fracture or dislocation is noted. Moderate suprapatellar joint effusion is noted. Severe narrowing of the medial and lateral joint spaces are noted. IMPRESSION: Severe degenerative joint disease. Moderate suprapatellar joint effusion is noted. No acute fracture or dislocation is noted. Electronically Signed   By: Marijo Conception M.D.   On: 06/17/2021 12:07    ____________________________________________  PROCEDURES   Procedure(s) performed (including Critical Care):  Procedures  ____________________________________________  INITIAL IMPRESSION / MDM / [place] / ED COURSE  As part of my medical decision making, I reviewed the following data within the [place] notes reviewed and incorporated, Old chart reviewed, Notes from prior ED visits, and [place] [employer]rolled Substance [place] was evaluated in Emergency Department on 06/17/2021 for the symptoms described in the history of present illness. She was evaluated in the context of the global COVID-19 pandemic, which necessitated consideration that the patient  might be at risk for infection with  the SARS-CoV-2 virus that causes COVID-19. Institutional protocols and algorithms that pertain to the evaluation of patients at risk for COVID-19 are in a state of rapid change based on information released by regulatory bodies including the CDC and federal and state organizations. These policies and algorithms were followed during the patient's care in the ED.  Some ED evaluations and interventions may be delayed as a result of limited staffing during the pandemic.*     Medical Decision Making: Well-appearing 87 year old female here with left ankle pain after inversion injury last night.  Patient is otherwise stable and at her medical and mental baseline according to her as well as family.  The being said, given her age and blood thinner use, screening labs obtained and are overall very reassuring.  No significant change in her hemoglobin.  BMP unremarkable.  CT head and C-spine reviewed by me and are negative.  Plain films of the chest and knee obtained, reviewed, and are negative.  Plain film of the left ankle shows a minimally displaced distal fibular shaft fracture.  Patient will be placed in a cam walker with weightbearing as tolerated.  She is essentially wheelchair-bound at baseline per family.  They feel comfortable with managing this at home.  Will advise Tylenol and elevation for pain.  Return precautions given.  ____________________________________________  FINAL CLINICAL IMPRESSION(S) / ED DIAGNOSES  Final diagnoses:  Closed displaced oblique fracture of shaft of left fibula, initial encounter     MEDICATIONS GIVEN DURING THIS VISIT:  Medications  acetaminophen (TYLENOL) tablet 1,000 mg (1,000 mg Oral Given 06/17/21 1142)  oxyCODONE (Oxy IR/ROXICODONE) immediate release tablet 2.5 mg (2.5 mg Oral Given 06/17/21 1142)  ondansetron (ZOFRAN-ODT) disintegrating tablet 4 mg (4 mg Oral Given 06/17/21 1142)  ipratropium-albuterol (DUONEB) 0.5-2.5 (3)  MG/3ML nebulizer solution 3 mL (3 mLs Nebulization Given 06/17/21 1347)     ED Discharge Orders     None        Note:  This document was prepared using Dragon voice recognition software and may include unintentional dictation errors.   Duffy Bruce, MD 06/17/21 1339    Duffy Bruce, MD 06/17/21 [phone]

## 2021-06-17 NOTE — ED Triage Notes (Signed)
Pt states they were sitting in their wheelchair trying to get around and got their ankle caught on the table when they were trying to stand, ankle twisted and began hurting, pt sat back down into wheelchair.

## 2021-06-17 NOTE — ED Triage Notes (Signed)
From home via [facility], slid and fell to the floor in her home. Denies LOC, on Eliquis, Swelling noted to L ankle.

## 2021-06-17 NOTE — ED Notes (Signed)
CAM boot applied to this pt's LLE at this time.

## 2021-06-17 NOTE — ED Notes (Signed)
Pt's daughter, [name], called at this time to verify family would be picking up this pt. Daughter stated someone would not be here until 4pm to pick up this pt.

## 2021-06-26 DIAGNOSIS — M19072 Primary osteoarthritis, left ankle and foot: Secondary | ICD-10-CM | POA: Diagnosis not present

## 2021-06-26 DIAGNOSIS — S82832A Other fracture of upper and lower end of left fibula, initial encounter for closed fracture: Secondary | ICD-10-CM | POA: Diagnosis not present

## 2021-06-26 DIAGNOSIS — M13862 Other specified arthritis, left knee: Secondary | ICD-10-CM | POA: Diagnosis not present

## 2021-07-01 DIAGNOSIS — I509 Heart failure, unspecified: Secondary | ICD-10-CM | POA: Diagnosis not present

## 2021-07-03 DIAGNOSIS — S82832A Other fracture of upper and lower end of left fibula, initial encounter for closed fracture: Secondary | ICD-10-CM | POA: Diagnosis not present

## 2021-07-22 DIAGNOSIS — Z794 Long term (current) use of insulin: Secondary | ICD-10-CM | POA: Diagnosis not present

## 2021-07-22 DIAGNOSIS — E785 Hyperlipidemia, unspecified: Secondary | ICD-10-CM | POA: Diagnosis not present

## 2021-07-22 DIAGNOSIS — E1142 Type 2 diabetes mellitus with diabetic polyneuropathy: Secondary | ICD-10-CM | POA: Diagnosis not present

## 2021-07-22 DIAGNOSIS — I152 Hypertension secondary to endocrine disorders: Secondary | ICD-10-CM | POA: Diagnosis not present

## 2021-07-22 DIAGNOSIS — E1169 Type 2 diabetes mellitus with other specified complication: Secondary | ICD-10-CM | POA: Diagnosis not present

## 2021-07-22 DIAGNOSIS — E1159 Type 2 diabetes mellitus with other circulatory complications: Secondary | ICD-10-CM | POA: Diagnosis not present

## 2021-07-22 DIAGNOSIS — M810 Age-related osteoporosis without current pathological fracture: Secondary | ICD-10-CM | POA: Diagnosis not present

## 2021-08-01 DIAGNOSIS — I509 Heart failure, unspecified: Secondary | ICD-10-CM | POA: Diagnosis not present

## 2021-08-15 DIAGNOSIS — S82832A Other fracture of upper and lower end of left fibula, initial encounter for closed fracture: Secondary | ICD-10-CM | POA: Diagnosis not present

## 2021-09-01 DIAGNOSIS — I509 Heart failure, unspecified: Secondary | ICD-10-CM | POA: Diagnosis not present

## 2021-09-29 DIAGNOSIS — I509 Heart failure, unspecified: Secondary | ICD-10-CM | POA: Diagnosis not present

## 2021-10-30 DIAGNOSIS — I509 Heart failure, unspecified: Secondary | ICD-10-CM | POA: Diagnosis not present

## 2021-11-29 DIAGNOSIS — I509 Heart failure, unspecified: Secondary | ICD-10-CM | POA: Diagnosis not present

## 2021-12-26 DIAGNOSIS — I110 Hypertensive heart disease with heart failure: Secondary | ICD-10-CM | POA: Diagnosis not present

## 2021-12-26 DIAGNOSIS — E119 Type 2 diabetes mellitus without complications: Secondary | ICD-10-CM | POA: Diagnosis not present

## 2021-12-26 DIAGNOSIS — E669 Obesity, unspecified: Secondary | ICD-10-CM | POA: Diagnosis not present

## 2021-12-26 DIAGNOSIS — Z794 Long term (current) use of insulin: Secondary | ICD-10-CM | POA: Diagnosis not present

## 2021-12-26 DIAGNOSIS — E785 Hyperlipidemia, unspecified: Secondary | ICD-10-CM | POA: Diagnosis not present

## 2021-12-26 DIAGNOSIS — Z0000 Encounter for general adult medical examination without abnormal findings: Secondary | ICD-10-CM | POA: Diagnosis not present

## 2021-12-26 DIAGNOSIS — Z950 Presence of cardiac pacemaker: Secondary | ICD-10-CM | POA: Diagnosis not present

## 2021-12-26 DIAGNOSIS — I509 Heart failure, unspecified: Secondary | ICD-10-CM | POA: Diagnosis not present

## 2021-12-26 DIAGNOSIS — Z79899 Other long term (current) drug therapy: Secondary | ICD-10-CM | POA: Diagnosis not present

## 2021-12-26 DIAGNOSIS — I5022 Chronic systolic (congestive) heart failure: Secondary | ICD-10-CM | POA: Diagnosis not present

## 2021-12-30 DIAGNOSIS — I509 Heart failure, unspecified: Secondary | ICD-10-CM | POA: Diagnosis not present

## 2022-01-02 DIAGNOSIS — I5022 Chronic systolic (congestive) heart failure: Secondary | ICD-10-CM | POA: Diagnosis not present

## 2022-01-02 DIAGNOSIS — R7981 Abnormal blood-gas level: Secondary | ICD-10-CM | POA: Diagnosis not present

## 2022-01-02 DIAGNOSIS — R0609 Other forms of dyspnea: Secondary | ICD-10-CM | POA: Diagnosis not present

## 2022-01-13 DIAGNOSIS — E785 Hyperlipidemia, unspecified: Secondary | ICD-10-CM | POA: Diagnosis not present

## 2022-01-13 DIAGNOSIS — D649 Anemia, unspecified: Secondary | ICD-10-CM | POA: Diagnosis not present

## 2022-01-13 DIAGNOSIS — I152 Hypertension secondary to endocrine disorders: Secondary | ICD-10-CM | POA: Diagnosis not present

## 2022-01-13 DIAGNOSIS — E1169 Type 2 diabetes mellitus with other specified complication: Secondary | ICD-10-CM | POA: Diagnosis not present

## 2022-01-13 DIAGNOSIS — E1159 Type 2 diabetes mellitus with other circulatory complications: Secondary | ICD-10-CM | POA: Diagnosis not present

## 2022-01-13 DIAGNOSIS — J449 Chronic obstructive pulmonary disease, unspecified: Secondary | ICD-10-CM | POA: Diagnosis not present

## 2022-01-13 DIAGNOSIS — I5022 Chronic systolic (congestive) heart failure: Secondary | ICD-10-CM | POA: Diagnosis not present

## 2022-01-21 DIAGNOSIS — R0989 Other specified symptoms and signs involving the circulatory and respiratory systems: Secondary | ICD-10-CM | POA: Diagnosis not present

## 2022-01-21 DIAGNOSIS — E1169 Type 2 diabetes mellitus with other specified complication: Secondary | ICD-10-CM | POA: Diagnosis not present

## 2022-01-21 DIAGNOSIS — E1159 Type 2 diabetes mellitus with other circulatory complications: Secondary | ICD-10-CM | POA: Diagnosis not present

## 2022-01-21 DIAGNOSIS — I340 Nonrheumatic mitral (valve) insufficiency: Secondary | ICD-10-CM | POA: Diagnosis not present

## 2022-01-21 DIAGNOSIS — Z950 Presence of cardiac pacemaker: Secondary | ICD-10-CM | POA: Diagnosis not present

## 2022-01-21 DIAGNOSIS — I071 Rheumatic tricuspid insufficiency: Secondary | ICD-10-CM | POA: Diagnosis not present

## 2022-01-21 DIAGNOSIS — I2510 Atherosclerotic heart disease of native coronary artery without angina pectoris: Secondary | ICD-10-CM | POA: Diagnosis not present

## 2022-01-21 DIAGNOSIS — I5042 Chronic combined systolic (congestive) and diastolic (congestive) heart failure: Secondary | ICD-10-CM | POA: Diagnosis not present

## 2022-01-21 DIAGNOSIS — I350 Nonrheumatic aortic (valve) stenosis: Secondary | ICD-10-CM | POA: Diagnosis not present

## 2022-01-24 DIAGNOSIS — Z79899 Other long term (current) drug therapy: Secondary | ICD-10-CM | POA: Diagnosis not present

## 2022-01-24 DIAGNOSIS — R7989 Other specified abnormal findings of blood chemistry: Secondary | ICD-10-CM | POA: Diagnosis not present

## 2022-02-06 DIAGNOSIS — I5042 Chronic combined systolic (congestive) and diastolic (congestive) heart failure: Secondary | ICD-10-CM | POA: Diagnosis not present

## 2022-02-26 DIAGNOSIS — Z9981 Dependence on supplemental oxygen: Secondary | ICD-10-CM | POA: Diagnosis not present

## 2022-02-26 DIAGNOSIS — R0609 Other forms of dyspnea: Secondary | ICD-10-CM | POA: Diagnosis not present

## 2022-02-26 DIAGNOSIS — J449 Chronic obstructive pulmonary disease, unspecified: Secondary | ICD-10-CM | POA: Diagnosis not present

## 2022-02-26 DIAGNOSIS — I272 Pulmonary hypertension, unspecified: Secondary | ICD-10-CM | POA: Diagnosis not present

## 2022-03-04 DIAGNOSIS — I6523 Occlusion and stenosis of bilateral carotid arteries: Secondary | ICD-10-CM | POA: Diagnosis not present

## 2022-03-04 DIAGNOSIS — I340 Nonrheumatic mitral (valve) insufficiency: Secondary | ICD-10-CM | POA: Diagnosis not present

## 2022-03-04 DIAGNOSIS — E1169 Type 2 diabetes mellitus with other specified complication: Secondary | ICD-10-CM | POA: Diagnosis not present

## 2022-03-04 DIAGNOSIS — I5042 Chronic combined systolic (congestive) and diastolic (congestive) heart failure: Secondary | ICD-10-CM | POA: Diagnosis not present

## 2022-03-04 DIAGNOSIS — I071 Rheumatic tricuspid insufficiency: Secondary | ICD-10-CM | POA: Diagnosis not present

## 2022-03-04 DIAGNOSIS — I350 Nonrheumatic aortic (valve) stenosis: Secondary | ICD-10-CM | POA: Diagnosis not present

## 2022-03-04 DIAGNOSIS — Z950 Presence of cardiac pacemaker: Secondary | ICD-10-CM | POA: Diagnosis not present

## 2022-03-04 DIAGNOSIS — E1142 Type 2 diabetes mellitus with diabetic polyneuropathy: Secondary | ICD-10-CM | POA: Diagnosis not present

## 2022-03-04 DIAGNOSIS — I2510 Atherosclerotic heart disease of native coronary artery without angina pectoris: Secondary | ICD-10-CM | POA: Diagnosis not present

## 2022-03-12 DIAGNOSIS — I495 Sick sinus syndrome: Secondary | ICD-10-CM | POA: Diagnosis not present

## 2022-03-21 DIAGNOSIS — E1142 Type 2 diabetes mellitus with diabetic polyneuropathy: Secondary | ICD-10-CM | POA: Diagnosis not present

## 2022-03-21 DIAGNOSIS — Z79899 Other long term (current) drug therapy: Secondary | ICD-10-CM | POA: Diagnosis not present

## 2022-03-21 DIAGNOSIS — I2510 Atherosclerotic heart disease of native coronary artery without angina pectoris: Secondary | ICD-10-CM | POA: Diagnosis not present

## 2022-03-21 DIAGNOSIS — E538 Deficiency of other specified B group vitamins: Secondary | ICD-10-CM | POA: Diagnosis not present

## 2022-03-21 DIAGNOSIS — D509 Iron deficiency anemia, unspecified: Secondary | ICD-10-CM | POA: Diagnosis not present

## 2022-03-21 DIAGNOSIS — I5022 Chronic systolic (congestive) heart failure: Secondary | ICD-10-CM | POA: Diagnosis not present

## 2022-03-21 DIAGNOSIS — E785 Hyperlipidemia, unspecified: Secondary | ICD-10-CM | POA: Diagnosis not present

## 2022-03-21 DIAGNOSIS — E1169 Type 2 diabetes mellitus with other specified complication: Secondary | ICD-10-CM | POA: Diagnosis not present

## 2022-04-18 DIAGNOSIS — I152 Hypertension secondary to endocrine disorders: Secondary | ICD-10-CM | POA: Diagnosis not present

## 2022-04-18 DIAGNOSIS — E1142 Type 2 diabetes mellitus with diabetic polyneuropathy: Secondary | ICD-10-CM | POA: Diagnosis not present

## 2022-04-18 DIAGNOSIS — M810 Age-related osteoporosis without current pathological fracture: Secondary | ICD-10-CM | POA: Diagnosis not present

## 2022-04-18 DIAGNOSIS — E1159 Type 2 diabetes mellitus with other circulatory complications: Secondary | ICD-10-CM | POA: Diagnosis not present

## 2022-04-18 DIAGNOSIS — E1169 Type 2 diabetes mellitus with other specified complication: Secondary | ICD-10-CM | POA: Diagnosis not present

## 2022-04-18 DIAGNOSIS — Z794 Long term (current) use of insulin: Secondary | ICD-10-CM | POA: Diagnosis not present

## 2022-04-18 DIAGNOSIS — E785 Hyperlipidemia, unspecified: Secondary | ICD-10-CM | POA: Diagnosis not present

## 2022-06-20 ENCOUNTER — Other Ambulatory Visit: Payer: Self-pay

## 2022-06-20 ENCOUNTER — Inpatient Hospital Stay
Admission: EM | Admit: 2022-06-20 | Discharge: 2022-06-23 | DRG: 291 | Disposition: A | Discharge status: Home Health | Payer: Medicare HMO | Attending: Internal Medicine | Admitting: Internal Medicine

## 2022-06-20 ENCOUNTER — Emergency Department: Payer: Medicare HMO

## 2022-06-20 DIAGNOSIS — Z9981 Dependence on supplemental oxygen: Secondary | ICD-10-CM

## 2022-06-20 DIAGNOSIS — E162 Hypoglycemia, unspecified: Secondary | ICD-10-CM | POA: Diagnosis not present

## 2022-06-20 DIAGNOSIS — G894 Chronic pain syndrome: Secondary | ICD-10-CM | POA: Diagnosis present

## 2022-06-20 DIAGNOSIS — E11649 Type 2 diabetes mellitus with hypoglycemia without coma: Secondary | ICD-10-CM | POA: Diagnosis present

## 2022-06-20 DIAGNOSIS — I5023 Acute on chronic systolic (congestive) heart failure: Secondary | ICD-10-CM

## 2022-06-20 DIAGNOSIS — Z8673 Personal history of transient ischemic attack (TIA), and cerebral infarction without residual deficits: Secondary | ICD-10-CM | POA: Diagnosis not present

## 2022-06-20 DIAGNOSIS — Z96651 Presence of right artificial knee joint: Secondary | ICD-10-CM | POA: Diagnosis present

## 2022-06-20 DIAGNOSIS — Z1152 Encounter for screening for COVID-19: Secondary | ICD-10-CM

## 2022-06-20 DIAGNOSIS — I442 Atrioventricular block, complete: Secondary | ICD-10-CM | POA: Diagnosis present

## 2022-06-20 DIAGNOSIS — I482 Chronic atrial fibrillation, unspecified: Secondary | ICD-10-CM | POA: Diagnosis present

## 2022-06-20 DIAGNOSIS — Z96643 Presence of artificial hip joint, bilateral: Secondary | ICD-10-CM | POA: Diagnosis present

## 2022-06-20 DIAGNOSIS — G459 Transient cerebral ischemic attack, unspecified: Secondary | ICD-10-CM

## 2022-06-20 DIAGNOSIS — I5A Non-ischemic myocardial injury (non-traumatic): Secondary | ICD-10-CM | POA: Diagnosis not present

## 2022-06-20 DIAGNOSIS — Z66 Do not resuscitate: Secondary | ICD-10-CM | POA: Diagnosis not present

## 2022-06-20 DIAGNOSIS — Z886 Allergy status to analgesic agent status: Secondary | ICD-10-CM

## 2022-06-20 DIAGNOSIS — Z8249 Family history of ischemic heart disease and other diseases of the circulatory system: Secondary | ICD-10-CM

## 2022-06-20 DIAGNOSIS — Z951 Presence of aortocoronary bypass graft: Secondary | ICD-10-CM | POA: Diagnosis not present

## 2022-06-20 DIAGNOSIS — Z950 Presence of cardiac pacemaker: Secondary | ICD-10-CM

## 2022-06-20 DIAGNOSIS — Z794 Long term (current) use of insulin: Secondary | ICD-10-CM

## 2022-06-20 DIAGNOSIS — R918 Other nonspecific abnormal finding of lung field: Secondary | ICD-10-CM | POA: Diagnosis not present

## 2022-06-20 DIAGNOSIS — I2510 Atherosclerotic heart disease of native coronary artery without angina pectoris: Secondary | ICD-10-CM | POA: Diagnosis present

## 2022-06-20 DIAGNOSIS — Z880 Allergy status to penicillin: Secondary | ICD-10-CM

## 2022-06-20 DIAGNOSIS — G9341 Metabolic encephalopathy: Secondary | ICD-10-CM | POA: Diagnosis present

## 2022-06-20 DIAGNOSIS — R4182 Altered mental status, unspecified: Principal | ICD-10-CM

## 2022-06-20 DIAGNOSIS — Z820 Family history of epilepsy and other diseases of the nervous system: Secondary | ICD-10-CM

## 2022-06-20 DIAGNOSIS — Z7901 Long term (current) use of anticoagulants: Secondary | ICD-10-CM

## 2022-06-20 DIAGNOSIS — R410 Disorientation, unspecified: Secondary | ICD-10-CM | POA: Diagnosis not present

## 2022-06-20 DIAGNOSIS — R55 Syncope and collapse: Secondary | ICD-10-CM | POA: Diagnosis not present

## 2022-06-20 DIAGNOSIS — I2489 Other forms of acute ischemic heart disease: Secondary | ICD-10-CM | POA: Diagnosis not present

## 2022-06-20 DIAGNOSIS — Z9889 Other specified postprocedural states: Secondary | ICD-10-CM | POA: Diagnosis not present

## 2022-06-20 DIAGNOSIS — J449 Chronic obstructive pulmonary disease, unspecified: Secondary | ICD-10-CM | POA: Diagnosis present

## 2022-06-20 DIAGNOSIS — F0394 Unspecified dementia, unspecified severity, with anxiety: Secondary | ICD-10-CM | POA: Diagnosis present

## 2022-06-20 DIAGNOSIS — E1142 Type 2 diabetes mellitus with diabetic polyneuropathy: Secondary | ICD-10-CM | POA: Diagnosis not present

## 2022-06-20 DIAGNOSIS — I495 Sick sinus syndrome: Secondary | ICD-10-CM | POA: Diagnosis present

## 2022-06-20 DIAGNOSIS — K219 Gastro-esophageal reflux disease without esophagitis: Secondary | ICD-10-CM | POA: Diagnosis present

## 2022-06-20 DIAGNOSIS — E785 Hyperlipidemia, unspecified: Secondary | ICD-10-CM | POA: Diagnosis not present

## 2022-06-20 DIAGNOSIS — E1122 Type 2 diabetes mellitus with diabetic chronic kidney disease: Secondary | ICD-10-CM | POA: Diagnosis present

## 2022-06-20 DIAGNOSIS — I517 Cardiomegaly: Secondary | ICD-10-CM | POA: Diagnosis not present

## 2022-06-20 DIAGNOSIS — Z79899 Other long term (current) drug therapy: Secondary | ICD-10-CM

## 2022-06-20 DIAGNOSIS — I10 Essential (primary) hypertension: Secondary | ICD-10-CM | POA: Diagnosis not present

## 2022-06-20 DIAGNOSIS — I110 Hypertensive heart disease with heart failure: Secondary | ICD-10-CM | POA: Diagnosis not present

## 2022-06-20 DIAGNOSIS — Z888 Allergy status to other drugs, medicaments and biological substances status: Secondary | ICD-10-CM

## 2022-06-20 DIAGNOSIS — Z8049 Family history of malignant neoplasm of other genital organs: Secondary | ICD-10-CM

## 2022-06-20 HISTORY — DX: Non-ischemic myocardial injury (non-traumatic): I5A

## 2022-06-20 HISTORY — DX: Hypoglycemia, unspecified: E16.2

## 2022-06-20 HISTORY — DX: Metabolic encephalopathy: G93.41

## 2022-06-20 LAB — CBC WITH DIFFERENTIAL/PLATELET
Abs Immature Granulocytes: 0.03 10*3/uL (ref 0.00–0.07)
Basophils Absolute: 0.1 10*3/uL (ref 0.0–0.1)
Basophils Relative: 1 %
Eosinophils Absolute: 0 10*3/uL (ref 0.0–0.5)
Eosinophils Relative: 0 %
HCT: 45.4 % (ref 36.0–46.0)
Hemoglobin: 14.4 g/dL (ref 12.0–15.0)
Immature Granulocytes: 0 %
Lymphocytes Relative: 9 %
Lymphs Abs: 0.9 10*3/uL (ref 0.7–4.0)
MCH: 28.7 pg (ref 26.0–34.0)
MCHC: 31.7 g/dL (ref 30.0–36.0)
MCV: 90.6 fL (ref 80.0–100.0)
Monocytes Absolute: 1.3 10*3/uL — ABNORMAL HIGH (ref 0.1–1.0)
Monocytes Relative: 14 %
Neutro Abs: 7.4 10*3/uL (ref 1.7–7.7)
Neutrophils Relative %: 76 %
Platelets: 267 10*3/uL (ref 150–400)
RBC: 5.01 MIL/uL (ref 3.87–5.11)
RDW: 16.7 % — ABNORMAL HIGH (ref 11.5–15.5)
WBC: 9.7 10*3/uL (ref 4.0–10.5)
nRBC: 0 % (ref 0.0–0.2)

## 2022-06-20 LAB — CBG MONITORING, ED
Glucose-Capillary: 101 mg/dL — ABNORMAL HIGH (ref 70–99)
Glucose-Capillary: 81 mg/dL (ref 70–99)
Glucose-Capillary: 70 mg/dL (ref 70–99)
Glucose-Capillary: 88 mg/dL (ref 70–99)
Glucose-Capillary: 94 mg/dL (ref 70–99)
Glucose-Capillary: 86 mg/dL (ref 70–99)
Glucose-Capillary: 178 mg/dL — ABNORMAL HIGH (ref 70–99)

## 2022-06-20 LAB — COMPREHENSIVE METABOLIC PANEL
ALT: 32 U/L (ref 0–44)
AST: 49 U/L — ABNORMAL HIGH (ref 15–41)
Albumin: 3.6 g/dL (ref 3.5–5.0)
Alkaline Phosphatase: 67 U/L (ref 38–126)
Anion gap: 13 (ref 5–15)
BUN: 16 mg/dL (ref 8–23)
CO2: 23 mmol/L (ref 22–32)
Calcium: 8.8 mg/dL — ABNORMAL LOW (ref 8.9–10.3)
Chloride: 97 mmol/L — ABNORMAL LOW (ref 98–111)
Creatinine, Ser: 0.68 mg/dL (ref 0.44–1.00)
GFR, Estimated: >60 mL/min (ref >60)
Glucose, Bld: 81 mg/dL (ref 70–99)
Potassium: 3.5 mmol/L (ref 3.5–5.1)
Sodium: 133 mmol/L — ABNORMAL LOW (ref 135–145)
Total Bilirubin: 1.8 mg/dL — ABNORMAL HIGH (ref 0.3–1.2)
Total Protein: 7.1 g/dL (ref 6.5–8.1)

## 2022-06-20 LAB — URINALYSIS, ROUTINE W REFLEX MICROSCOPIC
Bilirubin Urine: NEGATIVE
Glucose, UA: >=500 mg/dL — AB
Ketones, ur: 20 mg/dL — AB
Leukocytes,Ua: NEGATIVE
Nitrite: NEGATIVE
Protein, ur: >=300 mg/dL — AB
Specific Gravity, Urine: 1.018 (ref 1.005–1.030)
pH: 6 (ref 5.0–8.0)

## 2022-06-20 LAB — BRAIN NATRIURETIC PEPTIDE: B Natriuretic Peptide: 1239.9 pg/mL — ABNORMAL HIGH (ref 0.0–100.0)

## 2022-06-20 LAB — BLOOD GAS, VENOUS
Acid-Base Excess: 3.7 mmol/L — ABNORMAL HIGH (ref 0.0–2.0)
Bicarbonate: 29.7 mmol/L — ABNORMAL HIGH (ref 20.0–28.0)
O2 Saturation: 50.2 %
Patient temperature: 37
pCO2, Ven: 49 mmHg (ref 44–60)
pH, Ven: 7.39 (ref 7.25–7.43)
pO2, Ven: 34 mmHg (ref 32–45)

## 2022-06-20 LAB — TROPONIN I (HIGH SENSITIVITY)
Troponin I (High Sensitivity): 33 ng/L — ABNORMAL HIGH (ref <18)
Troponin I (High Sensitivity): 43 ng/L — ABNORMAL HIGH (ref <18)

## 2022-06-20 LAB — AMMONIA: Ammonia: 27 umol/L (ref 9–35)

## 2022-06-20 LAB — RESP PANEL BY RT-PCR (RSV, FLU A&B, COVID)  RVPGX2
Influenza A by PCR: NEGATIVE
Influenza B by PCR: NEGATIVE
Resp Syncytial Virus by PCR: NEGATIVE
SARS Coronavirus 2 by RT PCR: NEGATIVE

## 2022-06-20 LAB — T4, FREE: Free T4: 1.15 ng/dL — ABNORMAL HIGH (ref 0.61–1.12)

## 2022-06-20 LAB — TSH: TSH: 1.747 u[IU]/mL (ref 0.350–4.500)

## 2022-06-20 MED ORDER — FUROSEMIDE 10 MG/ML IJ SOLN
40.0000 mg | Freq: Two times a day (BID) | INTRAMUSCULAR | Status: DC
  Administered 2022-06-21 (×2): 40 mg via INTRAVENOUS
  Filled 2022-06-20 (×2): qty 4

## 2022-06-20 MED ORDER — FUROSEMIDE 10 MG/ML IJ SOLN
40.0000 mg | Freq: Once | INTRAMUSCULAR | Status: AC
  Administered 2022-06-20: 40 mg via INTRAVENOUS
  Filled 2022-06-20: qty 4

## 2022-06-20 MED ORDER — CARVEDILOL 6.25 MG PO TABS
6.2500 mg | ORAL_TABLET | Freq: Two times a day (BID) | ORAL | Status: DC
  Administered 2022-06-20 – 2022-06-21 (×3): 6.25 mg via ORAL
  Filled 2022-06-20 (×3): qty 1

## 2022-06-20 MED ORDER — ACETAMINOPHEN 650 MG RE SUPP: 650.0000 mg | Freq: Four times a day (QID) | RECTAL | Status: DC | PRN

## 2022-06-20 MED ORDER — HALOPERIDOL LACTATE 5 MG/ML IJ SOLN
2.0000 mg | Freq: Three times a day (TID) | INTRAMUSCULAR | Status: DC | PRN
  Administered 2022-06-20: 2 mg via INTRAVENOUS
  Filled 2022-06-20: qty 1

## 2022-06-20 MED ORDER — HYDRALAZINE HCL 20 MG/ML IJ SOLN: 5.0000 mg | INTRAMUSCULAR | Status: DC | PRN

## 2022-06-20 MED ORDER — LISINOPRIL 10 MG PO TABS
10.0000 mg | ORAL_TABLET | Freq: Every day | ORAL | Status: DC
  Administered 2022-06-20 – 2022-06-23 (×4): 10 mg via ORAL
  Filled 2022-06-20 (×4): qty 1

## 2022-06-20 MED ORDER — LEVETIRACETAM IN NACL 500 MG/100ML IV SOLN
500.0000 mg | Freq: Two times a day (BID) | INTRAVENOUS | Status: DC
  Administered 2022-06-20 – 2022-06-23 (×5): 500 mg via INTRAVENOUS
  Filled 2022-06-20 (×9): qty 100

## 2022-06-20 MED ORDER — APIXABAN 5 MG PO TABS
5.0000 mg | ORAL_TABLET | Freq: Two times a day (BID) | ORAL | Status: DC
  Administered 2022-06-20 – 2022-06-23 (×6): 5 mg via ORAL
  Filled 2022-06-20 (×6): qty 1

## 2022-06-20 MED ORDER — GABAPENTIN 100 MG PO CAPS
100.0000 mg | ORAL_CAPSULE | Freq: Three times a day (TID) | ORAL | Status: DC
  Administered 2022-06-20 – 2022-06-23 (×10): 100 mg via ORAL
  Filled 2022-06-20 (×10): qty 1

## 2022-06-20 MED ORDER — ACETAMINOPHEN 325 MG PO TABS
650.0000 mg | ORAL_TABLET | Freq: Four times a day (QID) | ORAL | Status: DC | PRN
  Administered 2022-06-21 – 2022-06-23 (×3): 650 mg via ORAL
  Filled 2022-06-20 (×3): qty 2

## 2022-06-20 MED ORDER — ALBUTEROL SULFATE HFA 108 (90 BASE) MCG/ACT IN AERS: 2.0000 | INHALATION_SPRAY | RESPIRATORY_TRACT | Status: DC | PRN

## 2022-06-20 MED ORDER — PANTOPRAZOLE SODIUM 40 MG PO TBEC
40.0000 mg | DELAYED_RELEASE_TABLET | Freq: Every day | ORAL | Status: DC
  Administered 2022-06-20 – 2022-06-23 (×4): 40 mg via ORAL
  Filled 2022-06-20 (×4): qty 1

## 2022-06-20 MED ORDER — DEXTROSE 50 % IV SOLN: 50.0000 mL | INTRAVENOUS | Status: DC | PRN

## 2022-06-20 MED ORDER — LORAZEPAM 2 MG/ML IJ SOLN: 1.0000 mg | INTRAMUSCULAR | Status: DC | PRN

## 2022-06-20 MED ORDER — DEXTROSE 10 % IV SOLN: INTRAVENOUS | Status: DC

## 2022-06-20 MED ORDER — PRAVASTATIN SODIUM 40 MG PO TABS
40.0000 mg | ORAL_TABLET | Freq: Every evening | ORAL | Status: DC
  Administered 2022-06-20 – 2022-06-22 (×2): 40 mg via ORAL
  Filled 2022-06-20: qty 1
  Filled 2022-06-20: qty 2

## 2022-06-20 MED ORDER — VITAMIN B-12 1000 MCG PO TABS
1000.0000 ug | ORAL_TABLET | Freq: Every day | ORAL | Status: DC
  Administered 2022-06-20 – 2022-06-23 (×4): 1000 ug via ORAL
  Filled 2022-06-20: qty 1
  Filled 2022-06-20 (×2): qty 2
  Filled 2022-06-20: qty 1

## 2022-06-20 MED ORDER — ALBUTEROL SULFATE (2.5 MG/3ML) 0.083% IN NEBU: 2.5000 mg | INHALATION_SOLUTION | RESPIRATORY_TRACT | Status: DC | PRN

## 2022-06-20 MED ORDER — DM-GUAIFENESIN ER 30-600 MG PO TB12: 1.0000 | ORAL_TABLET | Freq: Two times a day (BID) | ORAL | Status: DC | PRN

## 2022-06-20 NOTE — ED Triage Notes (Signed)
Per [facility], pt C/O AMS for 2 days. No recent illness. FD reported a BGC of 31, [facility] reported a BGC of 54. After 253m D10 BGC was 147. Pt NAD and denies pain.

## 2022-06-20 NOTE — H&P (Signed)
History and Physical    Makailee Nudelman KKX:[id number] DOB: 22-Jul-1929 DOA: 06/20/2022  Referring MD/NP/PA:   PCP: Idelle Crouch, MD   Patient coming from:  The patient is coming from home.  Chief Complaint: AMS and SOB  HPI: Bernardette [name] is a 88 y.o. female with medical history significant of sCHF with EF 30-35%, on 3 L oxygen at baseline, hypertension, hyperlipidemia, TIA, GERD, anxiety, pacemaker placement due to complete heart block, CAD, atrial fibrillation on Eliquis, who presents with altered mental status and shortness breath.  Per her daughter-in-law at the bedside, at her normal baseline, patient is alert, orientated to person and place, most of the time orientated to time.  In the past 2 days, patient has been confused. Pt has poor appetite, decreased oral intake.  When I saw patient in ED, patient is not orientated x 3.  Patient moves all extremities normally.  No facial droop or slurred speech.  Patient has dry cough and shortness of breath per her daughter-in-law.  Does not seem to have chest pain or abdominal pain.  Patient has nausea, no vomiting or diarrhea noted.  Not sure if patient has symptoms of UTI.  Patient was found to have hypoglycemia with CBG 3 and 54 which improved to 147 after giving 250 cc of D50 per EMS.  But her blood sugar dropped to 70 on repeated test.  Data reviewed independently and ED Course: pt was found to have WBC 9.7, BNP 1239, UA negative, troponin level 33, negative COVID PCR, flu PCR and RSV, ammonia level 27, GFR> 60, temperature normal, blood pressure 114/81, heart rate 80s, RR 17, oxygen saturation 92-99% on home level of 3 L oxygen.  Chest x-ray showed interstitial edema.  CT of head is negative for acute intracranial abnormalities.  Patient is admitted to PCU as inpatient.   EKG: I have personally reviewed.  QTc 568, I do not see clear pacemaker marker, old left bundle blockade.   Review of Systems: Could not be reviewed due  to altered mental status.  Allergy:  Allergies  Allergen Reactions   Ambien [Zolpidem Tartrate] Other (See Comments)    Reaction:  Keeps pt awake    Penicillins Itching and Other (See Comments)    Has patient had a PCN reaction causing immediate rash, facial/tongue/throat swelling, SOB or lightheadedness with hypotension: No Has patient had a PCN reaction causing severe rash involving mucus membranes or skin necrosis: No Has patient had a PCN reaction that required hospitalization No Has patient had a PCN reaction occurring within the last 10 years: No If all of the above answers are "NO", then may proceed with Cephalosporin use.   Iodine Itching   Succinylcholine Other (See Comments)    Reaction:  Unknown    Etodolac Other (See Comments)    GI upset   Nsaids Other (See Comments)    Reaction: gi upset    Past Medical History:  Diagnosis Date   Anemia    Anxiety    Arthritis    Benign neoplasm of colon    CHF (congestive heart failure) (HCC)    Chronic kidney disease    Chronic pain    Complication of anesthesia 1980's   hard time waking up   Coronary artery disease with unspecified angina pectoris    Cough    Diabetes mellitus without complication (HCC)    Essential hypertension    GERD (gastroesophageal reflux disease)    High risk medication use  Hyperlipidemia    Plantar fascial fibromatosis    Presence of permanent cardiac pacemaker    Shortness of breath dyspnea     Past Surgical History:  Procedure Laterality Date   ABDOMINAL HYSTERECTOMY     BACK SURGERY  1960's   cage and screws in lower back   CATARACT EXTRACTION W/ INTRAOCULAR LENS  IMPLANT, BILATERAL Bilateral    CORONARY ARTERY BYPASS GRAFT  2008   triple   HARDWARE REMOVAL Right 12/08/2019   Procedure: HARDWARE REMOVAL;  Surgeon: Hessie Knows, MD;  Location: [facility];  Service: Orthopedics;  Laterality: Right;   JOINT REPLACEMENT Right    hip and knee   ORIF ANKLE FRACTURE Right 07/27/2014    Procedure: OPEN REDUCTION INTERNAL FIXATION (ORIF) ANKLE FRACTURE;  Surgeon: Alta Corning, MD;  Location: [place];  Service: Orthopedics;  Laterality: Right;   ORIF TIBIA FRACTURE Right 12/08/2019   Procedure: OPEN REDUCTION INTERNAL FIXATION (ORIF) TIBIA FRACTURE;  Surgeon: Hessie Knows, MD;  Location: [facility];  Service: Orthopedics;  Laterality: Right;   PACEMAKER INSERTION Left 03/14/2015   Procedure: INSERTION PACEMAKER;  Surgeon: Isaias Cowman, MD;  Location: [facility];  Service: Cardiovascular;  Laterality: Left;   PACEMAKER LEADLESS INSERTION N/A 01/24/2020   Procedure: PACEMAKER LEADLESS INSERTION;  Surgeon: Isaias Cowman, MD;  Location: [facility];  Service: Cardiovascular;  Laterality: N/A;   ROTATOR CUFF REPAIR Left    TOTAL HIP ARTHROPLASTY Right    TOTAL HIP ARTHROPLASTY Left 02/09/2018   Procedure: TOTAL HIP ARTHROPLASTY ANTERIOR APPROACH;  Surgeon: Hessie Knows, MD;  Location: [facility];  Service: Orthopedics;  Laterality: Left;   TOTAL KNEE ARTHROPLASTY Right    VEIN LIGATION AND STRIPPING      Social History:  reports that she has never smoked. She has never used smokeless tobacco. She reports that she does not drink alcohol and does not use drugs.  Family History:  Family History  Problem Relation Age of Onset   Heart attack Mother    Heart disease Father    Alzheimer's disease Sister    Cervical cancer Sister    Heart failure Son      Prior to Admission medications   Medication Sig Start Date End Date Taking? Authorizing Provider  acetaminophen (TYLENOL) 500 MG tablet Take 1 tablet (500 mg total) by mouth every 6 (six) hours as needed for mild pain or headache. 04/16/21   Fritzi Mandes, MD  albuterol (PROVENTIL HFA;VENTOLIN HFA) 108 (90 Base) MCG/ACT inhaler Inhale 2 puffs into the lungs every 6 (six) hours as needed for wheezing or shortness of breath. 01/28/17   Loletha Grayer, MD  apixaban (ELIQUIS) 5 MG TABS tablet Take 5 mg by mouth 2 (two) times  daily.    [provider]  benzonatate (TESSALON) 200 MG capsule Take 200 mg by mouth 3 (three) times daily as needed for cough. 02/23/21   [provider]  calcium-vitamin D (OSCAL WITH D) 500-200 MG-UNIT tablet Take 1 tablet by mouth daily.    [provider]  carvedilol (COREG) 6.25 MG tablet Take 1 tablet (6.25 mg total) by mouth 2 (two) times daily with a meal. 01/28/17   Loletha Grayer, MD  furosemide (LASIX) 20 MG tablet Take 1 tablet (20 mg total) by mouth 2 (two) times daily. Patient taking differently: Take 20 mg by mouth 2 (two) times daily. 02/27/20   Para Skeans, MD  insulin glargine (LANTUS) 100 UNIT/ML injection Inject 0.1 mLs (10 Units total) into the skin  daily. 04/16/21   Fritzi Mandes, MD  levETIRAcetam (KEPPRA) 250 MG tablet Take 1 tablet (250 mg total) by mouth 2 (two) times daily. 04/16/21   Fritzi Mandes, MD  lisinopril (ZESTRIL) 10 MG tablet Take 10 mg by mouth daily. 02/07/21   [provider]  Multiple Vitamins-Minerals (PRESERVISION AREDS 2) CAPS Take 1 capsule by mouth daily.    [provider]  omeprazole (PRILOSEC) 40 MG capsule Take 40 mg by mouth daily.     [provider]  pravastatin (PRAVACHOL) 40 MG tablet Take 40 mg by mouth at bedtime.    [provider]  vitamin B-12 1000 MCG tablet Take 1 tablet (1,000 mcg total) by mouth daily. 04/17/21   Fritzi Mandes, MD    Physical Exam: Vitals:   06/20/22 1145 06/20/22 1200 06/20/22 1215 06/20/22 1441  BP:  (!) 174/94  114/81  Pulse: 64 71 74 73  Resp: '15 16 16 16  '$ Temp:    98.4 F (36.9 C)  TempSrc:    Oral  SpO2: 99% 99% 96% 92%   General: Not in acute distress HEENT:       Eyes: PERRL, EOMI, no scleral icterus.       ENT: No discharge from the ears and nose, no pharynx injection, no tonsillar enlargement.        Neck: has positive JVD, no bruit, no mass felt. Heme: No neck lymph node enlargement. Cardiac: S1/S2, RRR, No murmurs, No gallops or  rubs. Respiratory: No rales, wheezing, rhonchi or rubs. GI: Soft, nondistended, nontender, no rebound pain, no organomegaly, BS present. GU: No hematuria Ext: has trace leg edema bilaterally. 1+DP/PT pulse bilaterally. Musculoskeletal: No joint deformities, No joint redness or warmth, no limitation of ROM in spin. Skin: No rashes.  Neuro: Confused, not oriented X3, not following command, cranial nerves II-XII grossly intact, moves all extremities  Psych: Patient is not psychotic, no suicidal or hemocidal ideation.  Labs on Admission: I have personally reviewed following labs and imaging studies  CBC: Recent Labs  Lab 06/20/22 1151  WBC 9.7  NEUTROABS 7.4  HGB 14.4  HCT 45.4  MCV 90.6  PLT 782   Basic Metabolic Panel: Recent Labs  Lab 06/20/22 1151  NA 133*  K 3.5  CL 97*  CO2 23  GLUCOSE 81  BUN 16  CREATININE 0.68  CALCIUM 8.8*   GFR: CrCl cannot be calculated (Unknown ideal weight.). Liver Function Tests: Recent Labs  Lab 06/20/22 1151  AST 49*  ALT 32  ALKPHOS 67  BILITOT 1.8*  PROT 7.1  ALBUMIN 3.6   No results for input(s): "LIPASE", "AMYLASE" in the last 168 hours. Recent Labs  Lab 06/20/22 1151  AMMONIA 27   Coagulation Profile: No results for input(s): "INR", "PROTIME" in the last 168 hours. Cardiac Enzymes: No results for input(s): "CKTOTAL", "CKMB", "CKMBINDEX", "TROPONINI" in the last 168 hours. BNP (last 3 results) No results for input(s): "PROBNP" in the last 8760 hours. HbA1C: No results for input(s): "HGBA1C" in the last 72 hours. CBG: Recent Labs  Lab 06/20/22 1217 06/20/22 1328 06/20/22 1429  GLUCAP 101* 81 70   Lipid Profile: No results for input(s): "CHOL", "HDL", "LDLCALC", "TRIG", "CHOLHDL", "LDLDIRECT" in the last 72 hours. Thyroid Function Tests: Recent Labs    06/20/22 1151  TSH 1.747  FREET4 1.15*   Anemia Panel: No results for input(s): "VITAMINB12", "FOLATE", "FERRITIN", "TIBC", "IRON", "RETICCTPCT" in the  last 72 hours. Urine analysis:    Component Value Date/Time  COLORURINE YELLOW (A) 06/20/2022 1139   APPEARANCEUR HAZY (A) 06/20/2022 1139   APPEARANCEUR Hazy 11/25/2013 2129   LABSPEC 1.018 06/20/2022 1139   LABSPEC 1.019 11/25/2013 2129   PHURINE 6.0 06/20/2022 1139   GLUCOSEU >=500 (A) 06/20/2022 1139   GLUCOSEU Negative 11/25/2013 2129   HGBUR SMALL (A) 06/20/2022 1139   BILIRUBINUR NEGATIVE 06/20/2022 1139   BILIRUBINUR Negative 11/25/2013 2129   KETONESUR 20 (A) 06/20/2022 1139   PROTEINUR >=300 (A) 06/20/2022 1139   NITRITE NEGATIVE 06/20/2022 1139   LEUKOCYTESUR NEGATIVE 06/20/2022 1139   LEUKOCYTESUR Trace 11/25/2013 2129   Sepsis Labs: '@LABRCNTIP'$ (procalcitonin:4,lacticidven:4) ) Recent Results (from the past 240 hour(s))  Resp panel by RT-PCR (RSV, Flu A&B, Covid) Anterior Nasal Swab     Status: None   Collection Time: 06/20/22 11:51 AM   Specimen: Anterior Nasal Swab  Result Value Ref Range Status   SARS Coronavirus 2 by RT PCR NEGATIVE NEGATIVE Final   Influenza A by PCR NEGATIVE NEGATIVE Final   Influenza B by PCR NEGATIVE NEGATIVE Final   Resp Syncytial Virus by PCR NEGATIVE NEGATIVE Final    Comment: Performed at [facility], [address] [place], [place] [address]     Radiological Exams on Admission: CT HEAD WO CONTRAST (5MM)  Result Date: 06/20/2022 CLINICAL DATA:  Provided history: Mental status change, unknown cause. Additional history provided: Altered mental status. EXAM: CT HEAD WITHOUT CONTRAST TECHNIQUE: Contiguous axial images were obtained from the base of the skull through the vertex without intravenous contrast. RADIATION DOSE REDUCTION: This exam was performed according to the departmental dose-optimization program which includes automated exposure control, adjustment of the mA and/or kV according to patient size and/or use of iterative reconstruction technique. COMPARISON:  Prior head CT examinations 06/17/2021 and earlier.  FINDINGS: Mildly motion degraded exam. Brain: Cerebral atrophy. Advanced patchy and ill-defined hypoattenuation within the cerebral white matter, nonspecific but compatible with chronic small vessel disease. There is no acute intracranial hemorrhage. No demarcated cortical infarct. No extra-axial fluid collection. No evidence of an intracranial mass. No midline shift. Vascular: No hyperdense vessel. Atherosclerotic calcifications. Skull: No fracture or aggressive osseous lesion. Sinuses/Orbits: No mass or acute finding within the imaged orbits. Trace mucosal thickening within the left ethmoid, right sphenoid and left maxillary sinuses. Small-volume frothy secretions within the left sphenoid sinus. IMPRESSION: 1. Mildly motion degraded exam. 2. Advanced chronic small vessel ischemic changes within the cerebral white matter. 3. Cerebral atrophy. 4. Mild paranasal sinus disease, as described. Electronically Signed   By: Kellie Simmering D.O.   On: 06/20/2022 12:41   DG Chest Portable 1 View  Result Date: 06/20/2022 CLINICAL DATA:  Day history of altered mental status EXAM: PORTABLE CHEST 1 VIEW COMPARISON:  Chest radiograph dated 06/17/2021 FINDINGS: Left chest wall pacemaker lead projects over the right ventricle. Slightly low lung volumes. Mild bilateral interstitial and patchy opacities. No pleural effusion or pneumothorax. Similar enlarged postoperative cardiomediastinal silhouette. Median sternotomy wires are nondisplaced. Unchanged fracture of the inferior most wire. Partially imaged left shoulder arthroplasty. Hardware appears intact. Old healed left rib fractures. IMPRESSION: Mild bilateral interstitial and patchy opacities, which may represent pulmonary edema or atypical infection. Electronically Signed   By: Darrin Nipper M.D.   On: 06/20/2022 11:58      Assessment/Plan Principal Problem:   Acute on chronic systolic CHF (congestive heart failure) (HCC) Active Problems:   CAD (coronary artery disease)    Myocardial injury   Atrial fibrillation, chronic (HCC)   Hypoglycemia   Acute  metabolic encephalopathy   Essential hypertension   TIA (transient ischemic attack)   Type 2 diabetes mellitus with diabetic polyneuropathy, with long-term current use of insulin (HCC)   HLD (hyperlipidemia)   Assessment and Plan:  Acute on chronic systolic CHF (congestive heart failure) ([place]): 2D echo on 02/06/22 showed EF of 25%.  Patient has leg edema, elevated BNP 1239, and positive JVD, clinically consistent with CHF exacerbation.  -Will admit to progressive unit as inpatient -Lasix 40 mg was given in ED, will continue 40 mg bid tomorrow -Daily weights -strict I/O's -Low salt diet -Fluid restriction -Obtain REDs Vest reading  Hx of CAD (coronary artery disease) and myocardial injury: trop 33 -Will not give aspirin since patient is on Eliquis -Trend troponin -Check A1c, FLP -Continue pravastatin, Coreg  Atrial fibrillation, chronic (HCC) -Eliquis -Coreg  Hypoglycemia: Likely due to decreased oral intake and continuation of Lantus. -Hold Lantus -Check CBG every hour -D50 as needed -D10 drip at 50 cc/h  Acute metabolic encephalopathy: Likely due to hypoglycemia.  CT head negative.  Urinalysis negative. -Frequent neurochecks -Fall precaution -As needed hold off for agitation  Essential hypertension -IV hydralazine as needed -Lisinopril, Coreg,  TIA (transient ischemic attack) -Patient is on Eliquis for A-fib -Pravastatin  Type 2 diabetes mellitus with diabetic polyneuropathy, with long-term current use of insulin ([place]): Recent A1c 7.1, poorly controlled.  Patient taking Jardiance and Lantus 26 units daily -Hold Lantus and Jardiance due to hypoglycemia  HLD (hyperlipidemia) -Pravastatin  Possible seizure: Per her daughter-in-law, patient may have seizure, but she is not very sure.  Patient is taking Keppra 500 mg twice daily -IV Keppra 500 mg twice daily -Seizure precaution -As  needed atvian     DVT ppx:on Eliquis  Code Status: DNR per her daughter-in law  Family Communication:  Yes, patient's daugher-in law   at bed side.     Disposition Plan:  Anticipate discharge back to previous environment  Consults called:  none  Admission status and Level of care: Progressive: as inpt     Dispo: The patient is from: Home              Anticipated d/c is to: Home              Anticipated d/c date is: 2 days              Patient currently is not medically stable to d/c.    Severity of Illness:  The appropriate patient status for this patient is INPATIENT. Inpatient status is judged to be reasonable and necessary in order to provide the required intensity of service to ensure the patient's safety. The patient's presenting symptoms, physical exam findings, and initial radiographic and laboratory data in the context of their chronic comorbidities is felt to place them at high risk for further clinical deterioration. Furthermore, it is not anticipated that the patient will be medically stable for discharge from the hospital within 2 midnights of admission.   * I certify that at the point of admission it is my clinical judgment that the patient will require inpatient hospital care spanning beyond 2 midnights from the point of admission due to high intensity of service, high risk for further deterioration and high frequency of surveillance required.*       Date of Service 06/20/2022    [facility]   If 7PM-7AM, please contact night-coverage [email or web address] 06/20/2022, 4:37 PM

## 2022-06-20 NOTE — ED Notes (Signed)
Lab to bedside

## 2022-06-20 NOTE — ED Provider Notes (Signed)
[facility] Provider Note    Event Date/Time   First MD Initiated Contact with Patient 06/20/22 1127     (approximate)   History   Altered Mental Status (Per [facility], pt C/O AMS for 2 days. No recent illness. FD reported a BGC of 31, [facility] reported a BGC of 54. After 251m D10 BGC was 147. Pt NAD and denies pain.)   HPI  Amber [name] a 88y.o. female past medical history of COPD, coronary disease, insulin-dependent diabetes hyperlipidemia A-fib who presents because of altered mental status.  Patient unable to provide any history because she is very altered.  Spoke with her daughter who notes that she has been confused over the last 2 days has not been eating and drinking typically has very good appetite.  Has not wanted to get out of bed on his complain of some nausea.  Because she has not eaten she has not taken her insulin in 2 days.  She was more confused today and I checked her blood sugar and it was low in the 30s.  When EMS arrived it was in the 50s and received 250 cc of D10 and improved to the 140s.     Past Medical History:  Diagnosis Date   Anemia    Anxiety    Arthritis    Benign neoplasm of colon    CHF (congestive heart failure) (HCC)    Chronic kidney disease    Chronic pain    Complication of anesthesia 1980's   hard time waking up   Coronary artery disease with unspecified angina pectoris    Cough    Diabetes mellitus without complication ([place]    Essential hypertension    GERD (gastroesophageal reflux disease)    High risk medication use    Hyperlipidemia    Plantar fascial fibromatosis    Presence of permanent cardiac pacemaker    Shortness of breath dyspnea     Patient Active Problem List   Diagnosis Date Noted   AMS (altered mental status) 04/12/2021   Shortness of breath 04/11/2021   Altered mental status 04/11/2021   History of atrial fibrillation 02/25/2020   Pacemaker 02/25/2020   Acute on chronic diastolic CHF  (congestive heart failure) ([facility] 02/25/2020   Complete heart block ([facility] 01/24/2020   Injury    S/P ORIF (open reduction internal fixation) fracture    Hypotension due to hypovolemia    Closed displaced fracture of right tibial tuberosity    Pain    Syncope and collapse 12/06/2019   Status post total hip replacement, left 02/09/2018   Primary osteoarthritis of left hip 01/06/2018   Chronic pain syndrome 04/09/2017   Other specified health status 04/08/2017   Disorder of bone, unspecified 04/08/2017   Long term current use of opiate analgesic 04/08/2017   Chronic low back pain (Primary Area of Pain) (Bilateral) (L>R) 04/08/2017   Chronic pain of both lower extremities  (Tertiary Area of Pain) 04/08/2017   Chronic groin pain (Secondary Area of Pain) 04/08/2017   Chronic neck pain (Fourth Area of Pain) (Bilateral) (L>R) 04/08/2017   Chronic sacroiliac joint pain 04/08/2017   Other long term (current) drug therapy 04/08/2017   Syncope 02/09/2017   Asthmatic bronchitis 01/26/2017   Fall 10/06/2016   Anxiety 08/08/2016   Acute on chronic respiratory failure with hypoxia ([facility] 07/26/2016   Acute on chronic systolic CHF (congestive heart failure) ([facility] 07/26/2016   TIA (transient ischemic attack) 07/25/2015   Sick  sinus syndrome ([place]) 03/14/2015   CAD (coronary artery disease) 02/23/2015   Chronic heart failure ([place]) 01/05/2015   Fracture dislocation of ankle 07/27/2014   IDDM (insulin dependent diabetes mellitus) 07/27/2014   Essential hypertension 07/27/2014   Multiple rib fractures 07/27/2014   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   Anemia 12/06/2013   Type 2 diabetes mellitus with diabetic polyneuropathy, with long-term current use of insulin ([place]) 12/06/2013     Physical Exam  Triage Vital Signs: ED Triage Vitals [06/20/22 1126]  Enc Vitals Group     BP (!) 180/91     Pulse Rate 89     Resp 17     Temp 98 F (36.7 C)     Temp Source Oral     SpO2 97 %     Weight       Height      Head Circumference      Peak Flow      Pain Score 0     Pain Loc      Pain Edu?      Excl. in [place]?     Most recent vital signs: Vitals:   06/20/22 1215 06/20/22 1441  BP:  114/81  Pulse: 74 73  Resp: 16 16  Temp:  98.4 F (36.9 C)  SpO2: 96% 92%     General: Awake, altered appears chronically ill CV:  Good peripheral perfusion. No edema Resp:  Normal effort.  Frequent cough, lungs sound clear Abd:  No distention.  Soft nontender Neuro:             Awake, Alert, Oriented x 1 Other:  Patient moves all of her extremities but she is not able to follow commands ground and tells me her name would not answer questions   ED Results / Procedures / Treatments  Labs (all labs ordered are listed, but only abnormal results are displayed) Labs Reviewed  COMPREHENSIVE METABOLIC PANEL - Abnormal; Notable for the following components:      Result Value   Sodium 133 (*)    Chloride 97 (*)    Calcium 8.8 (*)    AST 49 (*)    Total Bilirubin 1.8 (*)    All other components within normal limits  CBC WITH DIFFERENTIAL/PLATELET - Abnormal; Notable for the following components:   RDW 16.7 (*)    Monocytes Absolute 1.3 (*)    All other components within normal limits  T4, FREE - Abnormal; Notable for the following components:   Free T4 1.15 (*)    All other components within normal limits  BRAIN NATRIURETIC PEPTIDE - Abnormal; Notable for the following components:   B Natriuretic Peptide 1,239.9 (*)    All other components within normal limits  BLOOD GAS, VENOUS - Abnormal; Notable for the following components:   Bicarbonate 29.7 (*)    Acid-Base Excess 3.7 (*)    All other components within normal limits  CBG MONITORING, ED - Abnormal; Notable for the following components:   Glucose-Capillary 101 (*)    All other components within normal limits  TROPONIN I (HIGH SENSITIVITY) - Abnormal; Notable for the following components:   Troponin I (High Sensitivity) 33 (*)    All  other components within normal limits  RESP PANEL BY RT-PCR (RSV, FLU A&B, COVID)  RVPGX2  TSH  AMMONIA  URINALYSIS, ROUTINE W REFLEX MICROSCOPIC  CBG MONITORING, ED  CBG MONITORING, ED     EKG  EKG reviewed interpreted also shows nonspecific intraventricular conduction delay,  no clear pacer spikes, no Sgarbossa criteria to suggest ischemia   RADIOLOGY  Chest x-ray reviewed interpreted myself shows interstitial opacities  PROCEDURES:  Critical Care performed: No  Procedures  The patient is on the cardiac monitor to evaluate for evidence of arrhythmia and/or significant heart rate changes.   MEDICATIONS ORDERED IN ED: Medications  dextrose 10 % infusion (has no administration in time range)  furosemide (LASIX) injection 40 mg (40 mg Intravenous Given 06/20/22 1452)     IMPRESSION / MDM / ASSESSMENT AND PLAN / ED COURSE  I reviewed the triage vital signs and the nursing notes.                              Patient's presentation is most consistent with acute presentation with potential threat to life or bodily function.  Differential diagnosis includes, but is not limited to, hypoglycemia, CVA, intracranial hemorrhage, UTI, pneumonia, CHF exacerbation, hypercarbia  Patient is a 88 year old female who presents because of altered mental status.  Patient is not able to provide history is oriented to person only.  Per daughter she has had decreased level of responsiveness and not been eating over the last 2 days.  Has not taken insulin.  Today she became worse and her blood sugar was low in the 30s at home.  When EMS arrived it was in the 50s.  Received some D10 and improved to 140s.    Patient's vitals are overall reassuring.  She is satting in the mid 90s on her baseline 3 L.  She is oriented to person and moves all of her extremities but does not really follow commands.  Abdomen soft does not have obvious pitting edema and does not have increased work of breathing.   Patient's blood sugar is 80.  On repeat it is 4.  Because of altered mental status do not feel that she will safely be able to eat so we will start on D10.  Has not received insulin in the last 2 days.  Labs are notable for BNP of 1200 mildly elevated troponin but otherwise are reassuring.  COVID and flu negative chest x-ray read by radiology as likely some vascular congestion versus atypical infection.  CT head does not have any acute findings.  Will give a dose of IV Lasix.  Given patient has not had her baseline will admit to the hospitalist service.   Clinical Course as of 06/20/22 1522  Fri Jun 20, 2022  1432 Glucose-Capillary: 61 [KM]    Clinical Course User Index [KM] Rada Hay, MD     FINAL CLINICAL IMPRESSION(S) / ED DIAGNOSES   Final diagnoses:  Altered mental status, unspecified altered mental status type     Rx / DC Orders   ED Discharge Orders     None        Note:  This document was prepared using Dragon voice recognition software and may include unintentional dictation errors.   Rada Hay, MD 06/20/22 [phone]

## 2022-06-21 ENCOUNTER — Encounter: Payer: Self-pay | Admitting: Internal Medicine

## 2022-06-21 DIAGNOSIS — I5023 Acute on chronic systolic (congestive) heart failure: Secondary | ICD-10-CM | POA: Diagnosis not present

## 2022-06-21 DIAGNOSIS — G9341 Metabolic encephalopathy: Secondary | ICD-10-CM | POA: Diagnosis not present

## 2022-06-21 DIAGNOSIS — E162 Hypoglycemia, unspecified: Secondary | ICD-10-CM | POA: Diagnosis not present

## 2022-06-21 DIAGNOSIS — E1142 Type 2 diabetes mellitus with diabetic polyneuropathy: Secondary | ICD-10-CM | POA: Diagnosis not present

## 2022-06-21 LAB — BASIC METABOLIC PANEL
Anion gap: 10 (ref 5–15)
BUN: 15 mg/dL (ref 8–23)
CO2: 26 mmol/L (ref 22–32)
Calcium: 8.7 mg/dL — ABNORMAL LOW (ref 8.9–10.3)
Chloride: 99 mmol/L (ref 98–111)
Creatinine, Ser: 0.85 mg/dL (ref 0.44–1.00)
GFR, Estimated: >60 mL/min (ref >60)
Glucose, Bld: 114 mg/dL — ABNORMAL HIGH (ref 70–99)
Potassium: 3.3 mmol/L — ABNORMAL LOW (ref 3.5–5.1)
Sodium: 135 mmol/L (ref 135–145)

## 2022-06-21 LAB — CBG MONITORING, ED
Glucose-Capillary: 110 mg/dL — ABNORMAL HIGH (ref 70–99)
Glucose-Capillary: 115 mg/dL — ABNORMAL HIGH (ref 70–99)
Glucose-Capillary: 132 mg/dL — ABNORMAL HIGH (ref 70–99)
Glucose-Capillary: 147 mg/dL — ABNORMAL HIGH (ref 70–99)
Glucose-Capillary: 148 mg/dL — ABNORMAL HIGH (ref 70–99)
Glucose-Capillary: 227 mg/dL — ABNORMAL HIGH (ref 70–99)
Glucose-Capillary: 254 mg/dL — ABNORMAL HIGH (ref 70–99)

## 2022-06-21 LAB — MAGNESIUM: Magnesium: 1.8 mg/dL (ref 1.7–2.4)

## 2022-06-21 LAB — LIPID PANEL
Cholesterol: 191 mg/dL (ref 0–200)
HDL: 54 mg/dL (ref >40)
LDL Cholesterol: 119 mg/dL — ABNORMAL HIGH (ref 0–99)
Total CHOL/HDL Ratio: 3.5 RATIO
Triglycerides: 90 mg/dL (ref <150)
VLDL: 18 mg/dL (ref 0–40)

## 2022-06-21 LAB — GLUCOSE, CAPILLARY: Glucose-Capillary: 209 mg/dL — ABNORMAL HIGH (ref 70–99)

## 2022-06-21 LAB — TROPONIN I (HIGH SENSITIVITY): Troponin I (High Sensitivity): 44 ng/L — ABNORMAL HIGH (ref <18)

## 2022-06-21 MED ORDER — POTASSIUM CHLORIDE CRYS ER 20 MEQ PO TBCR
40.0000 meq | EXTENDED_RELEASE_TABLET | Freq: Once | ORAL | Status: AC
  Administered 2022-06-21: 40 meq via ORAL
  Filled 2022-06-21 (×2): qty 2

## 2022-06-21 MED ORDER — CARVEDILOL 3.125 MG PO TABS
3.1250 mg | ORAL_TABLET | Freq: Two times a day (BID) | ORAL | Status: DC
  Administered 2022-06-22 – 2022-06-23 (×4): 3.125 mg via ORAL
  Filled 2022-06-21 (×4): qty 1

## 2022-06-21 NOTE — Assessment & Plan Note (Signed)
Elevated troponin on admission likely demand ischemia from decompensated heart failure.  No chest pain or acute ischemic changes on EKG.

## 2022-06-21 NOTE — Assessment & Plan Note (Signed)
On statin.

## 2022-06-21 NOTE — Assessment & Plan Note (Signed)
Resolved.  POA, due to hypoglycemia.  CT head was negative.  UA negative for infection.  Baseline dementia with worsened confusion x 2 days prior to admission per family. 12/16: pt still lethargic but does respond 12/17: mental status greatly improved, alert, talkative, seems at baseline --Mgmt of underlying issues as outlined --Neuro checks --Delirium precautions

## 2022-06-21 NOTE — ED Notes (Signed)
Pt's brief and bed linens changed.

## 2022-06-21 NOTE — Progress Notes (Signed)
Progress Note   Patient: Amber [name] ZJQ:[id number] DOB: February 14, 1930 DOA: 06/20/2022     1 DOS: the patient was seen and examined on 06/21/2022   Brief hospital course: Amber [name] is a 88 y.o. female with medical history significant of sCHF with EF 30-35%, on 3 L oxygen at baseline, hypertension, hyperlipidemia, TIA, GERD, anxiety, pacemaker placement due to complete heart block, CAD, atrial fibrillation on Eliquis, who was admitted on 06/20/2022 for acute decompensation of systolic CHF after presenting with complaints of altered mental status and shortness breath.   History on admission: Per her daughter-in-law, at baseline, patient is alert, orientated to person and place, intermittently orientated to time.  She had worsening confusion x 2 days along with decreased PO intake due to poor appetite.   Patient was found to have hypoglycemia with CBG 3 and 54 which improved to 147 after giving 250 cc of D50 per EMS.  But her blood sugar dropped to 70 on repeat.  She was started on infusion of D10w and admitted to the hospital.  Started on IV diuresis as well due to signs of heart failure decompensation with interstitial edema on CXR, elevated BNP 1239.    Assessment and Plan: * Acute on chronic systolic CHF (congestive heart failure) (HCC) Continue Lasix 40 mg IV BID Monitor renal function, electrolytes, BP Strict I/O's, daily weights Fluid restriction, low sodium diet  CAD (coronary artery disease) On Eliquis, not on antiplatelet therapy. Continue statin, Coreg   Myocardial injury Elevated troponin on admission likely demand ischemia from decompensated heart failure.  No chest pain or acute ischemic changes on EKG.  Atrial fibrillation, chronic (HCC) Continue Eliquis. Continue Coreg, reduced dose & hold parameters for low BP's Telemetry  Hypoglycemia Glucose this afternoon in 200's. Stop D10 infusion Continue frequent CBG's, then go to every 4 hours No longer needs  stepdown, admit to Progressive. Monitor closely.  Acute metabolic encephalopathy Due to hypoglycemia.  CT head was negative.  UA negative for infection.  Baseline dementia with worsened confusion x 2 days prior to admission per family. 12/16: pt still lethargic but does respond --Mgmt of underlying issues as outlined --Neuro checks --Delirium precautions  Essential hypertension BP's have been very labile, at times low with MAP in 50's but other times significantly elevated.   Coreg dose reduced & hold parameter for MAP<65  TIA (transient ischemic attack) Continue Eliquis, statin  Type 2 diabetes mellitus with diabetic polyneuropathy, with long-term current use of insulin (HCC) Hypoglycemic on admission. Stopping dextrose infusion this afternoon. Hold off insulin for now and start sliding scale coverage if CBG's no longer dropping. CBG's every 4 hours  HLD (hyperlipidemia) On statin        Subjective: Pt seen in ED holding for a bed. No family present at the time. Pt was lethargic but would respond briefly.  She could not provide history or answer questions.    Physical Exam: Vitals:   06/21/22 1012 06/21/22 1120 06/21/22 1500 06/21/22 1609  BP:  (!) 93/52 126/66   Pulse:  60 60   Resp:  20 19   Temp: 98 F (36.7 C)   97.9 F (36.6 C)  TempSrc: Axillary   Oral  SpO2:  92% 97%    General exam: lethargic but responsive, no acute distress Respiratory system: bibasilar crackles, no wheezes Cardiovascular system: normal W4/O9, RRR, 2/6 systolic murmur Gastrointestinal system: soft, NT, ND Central nervous system: lethargic, arouses only briefly, non-verbal, does not follow commands Skin: dry,  intact, normal temperature Psychiatry: unable to assess due to lethargy / somnolence   Data Reviewed:  Notable labs ---  K 3.3, glucose 114 (CBG's 115 >> 147 >> 148 >> 227 >> 254.  LCL 119 otherwise normal lipids.    Family Communication: none present, will attempt to  call  Disposition: Status is: Inpatient Remains inpatient appropriate because: severity of illness with persistent encephalopathy, on IV diuresis, labile and at times critically low BP   Planned Discharge Destination: Home    Time spent: 55 minutes with >50% spent at bedside and in coordination of care  Author: Ezekiel Slocumb, DO 06/21/2022 4:37 PM  For on call review [email or web address].

## 2022-06-21 NOTE — Hospital Course (Signed)
Amber [name] is a 88 y.o. female with medical history significant of sCHF with EF 30-35%, on 3 L oxygen at baseline, hypertension, hyperlipidemia, TIA, GERD, anxiety, pacemaker placement due to complete heart block, CAD, atrial fibrillation on Eliquis, who was admitted on 06/20/2022 for acute decompensation of systolic CHF after presenting with complaints of altered mental status and shortness breath.   History on admission: Per her daughter-in-law, at baseline, patient is alert, orientated to person and place, intermittently orientated to time.  She had worsening confusion x 2 days along with decreased PO intake due to poor appetite.   Patient was found to have hypoglycemia with CBG 3 and 54 which improved to 147 after giving 250 cc of D50 per EMS.  But her blood sugar dropped to 70 on repeat.  She was started on infusion of D10w and admitted to the hospital.  Started on IV diuresis as well due to signs of heart failure decompensation with interstitial edema on CXR, elevated BNP 1239.   12/16: stopped D10, sugars stable but still lethargic. IV diuresis. 12/17: mental status at baseline, sugars stable, stop diuretics & monitor volume & respiratory status, renal function

## 2022-06-21 NOTE — Assessment & Plan Note (Addendum)
Continue Eliquis. Continue Coreg, reduced dose & hold parameters for low BP's Telemetry

## 2022-06-21 NOTE — ED Notes (Signed)
Advised nurse that patient has ready bed 

## 2022-06-21 NOTE — Assessment & Plan Note (Signed)
BP's have been very labile, at times low with MAP in 50's but other times significantly elevated.   Coreg dose reduced & hold parameter for MAP<65

## 2022-06-21 NOTE — Assessment & Plan Note (Signed)
Treated with Lasix 40 mg IV BID Hold off diuresis for today with soft BP's and patient is clinically euvolemic. Monitor renal function, electrolytes, BP Strict I/O's, daily weights Fluid restriction, low sodium diet

## 2022-06-21 NOTE — Assessment & Plan Note (Signed)
On Eliquis, not on antiplatelet therapy. Continue statin, Coreg

## 2022-06-21 NOTE — Assessment & Plan Note (Signed)
Continue Eliquis, statin 

## 2022-06-21 NOTE — ED Notes (Signed)
Pt cleaned up, bedding changed. Peri care performed. Brief changed. Repositioned in bed.

## 2022-06-21 NOTE — ED Notes (Signed)
Arbutus Ped, MD made aware of soft BP. Manual checked 86/40

## 2022-06-21 NOTE — Assessment & Plan Note (Signed)
Hypoglycemic on admission. Stopping dextrose infusion this afternoon. Hold off insulin for now and start sliding scale coverage if CBG's no longer dropping. CBG's every 4 hours

## 2022-06-21 NOTE — Assessment & Plan Note (Signed)
Glucose this afternoon in 200's. Stop D10 infusion Continue frequent CBG's, then go to every 4 hours No longer needs stepdown, admit to Progressive. Monitor closely. 12/17: sugars have been stable in 100's

## 2022-06-22 DIAGNOSIS — I5023 Acute on chronic systolic (congestive) heart failure: Secondary | ICD-10-CM | POA: Diagnosis not present

## 2022-06-22 LAB — BASIC METABOLIC PANEL
Anion gap: 12 (ref 5–15)
BUN: 16 mg/dL (ref 8–23)
CO2: 26 mmol/L (ref 22–32)
Calcium: 8.5 mg/dL — ABNORMAL LOW (ref 8.9–10.3)
Chloride: 98 mmol/L (ref 98–111)
Creatinine, Ser: 1.01 mg/dL — ABNORMAL HIGH (ref 0.44–1.00)
GFR, Estimated: 53 mL/min — ABNORMAL LOW (ref >60)
Glucose, Bld: 118 mg/dL — ABNORMAL HIGH (ref 70–99)
Potassium: 3.7 mmol/L (ref 3.5–5.1)
Sodium: 136 mmol/L (ref 135–145)

## 2022-06-22 LAB — GLUCOSE, CAPILLARY
Glucose-Capillary: 109 mg/dL — ABNORMAL HIGH (ref 70–99)
Glucose-Capillary: 111 mg/dL — ABNORMAL HIGH (ref 70–99)
Glucose-Capillary: 126 mg/dL — ABNORMAL HIGH (ref 70–99)
Glucose-Capillary: 147 mg/dL — ABNORMAL HIGH (ref 70–99)
Glucose-Capillary: 197 mg/dL — ABNORMAL HIGH (ref 70–99)
Glucose-Capillary: 213 mg/dL — ABNORMAL HIGH (ref 70–99)

## 2022-06-22 LAB — MAGNESIUM: Magnesium: 1.8 mg/dL (ref 1.7–2.4)

## 2022-06-22 LAB — PHOSPHORUS: Phosphorus: 2.8 mg/dL (ref 2.5–4.6)

## 2022-06-22 MED ORDER — ORAL CARE MOUTH RINSE: 15.0000 mL | OROMUCOSAL | Status: DC | PRN

## 2022-06-22 NOTE — Progress Notes (Signed)
Progress Note   Patient: Amber [name] [id number]:[id number] DOB: 12-14-1929 DOA: 06/20/2022     2 DOS: the patient was seen and examined on 06/22/2022   Brief hospital course: Damian Hofstra is a 88 y.o. female with medical history significant of sCHF with EF 30-35%, on 3 L oxygen at baseline, hypertension, hyperlipidemia, TIA, GERD, anxiety, pacemaker placement due to complete heart block, CAD, atrial fibrillation on Eliquis, who was admitted on 06/20/2022 for acute decompensation of systolic CHF after presenting with complaints of altered mental status and shortness breath.   History on admission: Per her daughter-in-law, at baseline, patient is alert, orientated to person and place, intermittently orientated to time.  She had worsening confusion x 2 days along with decreased PO intake due to poor appetite.   Patient was found to have hypoglycemia with CBG 3 and 54 which improved to 147 after giving 250 cc of D50 per EMS.  But her blood sugar dropped to 70 on repeat.  She was started on infusion of D10w and admitted to the hospital.  Started on IV diuresis as well due to signs of heart failure decompensation with interstitial edema on CXR, elevated BNP 1239.   12/16: stopped D10, sugars stable but still lethargic. IV diuresis. 12/17: mental status at baseline, sugars stable, stop diuretics & monitor volume & respiratory status, renal function  Assessment and Plan: * Acute on chronic systolic CHF (congestive heart failure) (HCC) Treated with Lasix 40 mg IV BID Hold off diuresis for today with soft BP's and patient is clinically euvolemic. Monitor renal function, electrolytes, BP Strict I/O's, daily weights Fluid restriction, low sodium diet  CAD (coronary artery disease) On Eliquis, not on antiplatelet therapy. Continue statin, Coreg   Myocardial injury Elevated troponin on admission likely demand ischemia from decompensated heart failure.  No chest pain or acute ischemic changes  on EKG.  Atrial fibrillation, chronic (HCC) Continue Eliquis. Continue Coreg, reduced dose & hold parameters for low BP's Telemetry  Hypoglycemia Glucose this afternoon in 200's. Stop D10 infusion Continue frequent CBG's, then go to every 4 hours No longer needs stepdown, admit to Progressive. Monitor closely. 12/17: sugars have been stable in 956'O   Acute metabolic encephalopathy Resolved.  POA, due to hypoglycemia.  CT head was negative.  UA negative for infection.  Baseline dementia with worsened confusion x 2 days prior to admission per family. 12/16: pt still lethargic but does respond 12/17: mental status greatly improved, alert, talkative, seems at baseline --Mgmt of underlying issues as outlined --Neuro checks --Delirium precautions  Essential hypertension BP's have been very labile, at times low with MAP in 50's but other times significantly elevated.   Coreg dose reduced & hold parameter for MAP<65  TIA (transient ischemic attack) Continue Eliquis, statin  Type 2 diabetes mellitus with diabetic polyneuropathy, with long-term current use of insulin (HCC) Hypoglycemic on admission. Stopping dextrose infusion this afternoon. Hold off insulin for now and start sliding scale coverage if CBG's no longer dropping. CBG's AC/HS  HLD (hyperlipidemia) On statin        Subjective: Pt awake long sitting in bed this AM.  She feels much better.  Does not remember meeting me yesterday (was very lethargic).  She has no acute complaints. Confirms she typically uses 3 L oxygen.  Physical Exam: Vitals:   06/22/22 0403 06/22/22 0818 06/22/22 1204 06/22/22 1703  BP: (!) 107/54 106/63 99/71 (!) 108/45  Pulse: 60 (!) 59 97 63  Resp: '15 18 17 '$ 18  Temp: 97.6 F (36.4 C) 98.4 F (36.9 C) 98.1 F (36.7 C) 98 F (36.7 C)  TempSrc: Oral Oral Oral Oral  SpO2: 100% 96% 97% 94%  Weight: 71.3 kg      General exam: awake and alert, no acute distress Respiratory system: clear b/l  with diminished bases, no wheezes Cardiovascular system: normal B1/Y7, RRR, 2/6 systolic murmur Gastrointestinal system: soft, NT, ND Central nervous system: Alert, oriented to self place and situation (not year), normal speech, grossly non-focal exam Skin: dry, intact, normal temperature Psychiatry: normal mood and affect   Data Reviewed:  Notable labs ---  CBG's 209 >> 147 >> 126 >> 111  Family Communication: none present, will attempt to call  Disposition: Status is: Inpatient Remains inpatient appropriate because: given severity of illness on admission, warrants 24 hours close of respiratory status, monitoring of BP and blood sugars, with repeats labs in AM   Planned Discharge Destination: Home    Time spent: 40 minutes   Author: Ezekiel Slocumb, DO 06/22/2022 5:59 PM  For on call review [email or web address].

## 2022-06-23 ENCOUNTER — Encounter: Payer: Self-pay | Admitting: Internal Medicine

## 2022-06-23 DIAGNOSIS — I5023 Acute on chronic systolic (congestive) heart failure: Secondary | ICD-10-CM | POA: Diagnosis not present

## 2022-06-23 LAB — BASIC METABOLIC PANEL
Anion gap: 10 (ref 5–15)
BUN: 24 mg/dL — ABNORMAL HIGH (ref 8–23)
CO2: 27 mmol/L (ref 22–32)
Calcium: 9.2 mg/dL (ref 8.9–10.3)
Chloride: 99 mmol/L (ref 98–111)
Creatinine, Ser: 0.9 mg/dL (ref 0.44–1.00)
GFR, Estimated: >60 mL/min (ref >60)
Glucose, Bld: 157 mg/dL — ABNORMAL HIGH (ref 70–99)
Potassium: 3.9 mmol/L (ref 3.5–5.1)
Sodium: 136 mmol/L (ref 135–145)

## 2022-06-23 LAB — HEMOGLOBIN A1C
Hgb A1c MFr Bld: 6.1 % — ABNORMAL HIGH (ref 4.8–5.6)
Mean Plasma Glucose: 128 mg/dL

## 2022-06-23 LAB — GLUCOSE, CAPILLARY
Glucose-Capillary: 135 mg/dL — ABNORMAL HIGH (ref 70–99)
Glucose-Capillary: 209 mg/dL — ABNORMAL HIGH (ref 70–99)
Glucose-Capillary: 163 mg/dL — ABNORMAL HIGH (ref 70–99)

## 2022-06-23 LAB — MAGNESIUM: Magnesium: 1.9 mg/dL (ref 1.7–2.4)

## 2022-06-23 MED ORDER — LEVETIRACETAM 500 MG PO TABS
500.0000 mg | ORAL_TABLET | Freq: Two times a day (BID) | ORAL | Status: DC
  Administered 2022-06-23: 500 mg via ORAL
  Filled 2022-06-23: qty 1

## 2022-06-23 MED ORDER — FUROSEMIDE 20 MG PO TABS
20.0000 mg | ORAL_TABLET | Freq: Two times a day (BID) | ORAL | Status: DC
  Administered 2022-06-23: 20 mg via ORAL
  Filled 2022-06-23: qty 1

## 2022-06-23 MED ORDER — EMPAGLIFLOZIN 10 MG PO TABS
10.0000 mg | ORAL_TABLET | Freq: Every day | ORAL | Status: DC
  Administered 2022-06-23: 10 mg via ORAL
  Filled 2022-06-23: qty 1

## 2022-06-23 MED ORDER — INSULIN GLARGINE 100 UNIT/ML ~~LOC~~ SOLN: 5.0000 [IU] | Freq: Every day | SUBCUTANEOUS | 0 refills | Status: DC

## 2022-06-23 MED ORDER — SODIUM CHLORIDE 0.9 % IV SOLN: INTRAVENOUS | Status: DC | PRN

## 2022-06-23 MED ORDER — ALBUTEROL SULFATE (2.5 MG/3ML) 0.083% IN NEBU: 2.5000 mg | INHALATION_SOLUTION | Freq: Four times a day (QID) | RESPIRATORY_TRACT | Status: DC | PRN

## 2022-06-23 NOTE — Evaluation (Signed)
Physical Therapy Evaluation Patient Details Name: Amber [name] MRN: [id number] DOB: 1929-07-26 Today's Date: 06/23/2022  History of Present Illness  88 y/o female presented to ED on 06/20/22 for AMS and SOB. Admitted for CHF exacerbation and hypoglycemia. PMH: dementia, HTN, T2DM, Afib, CAD, TIA, pacemaker placement 2/2 complete heart block  Clinical Impression  Patient admitted with the above. PTA, patient lives with son and daughter in law who provides 24/7 supervision/assistance. Patient and daughter in law report she primarily uses w/c for mobility but able to use RW for short distances with w/c follow. She is able to complete ADLs with supervision but requires assistance to get in/out of shower. Patient presents with weakness, impaired balance, decreased activity tolerance, and baseline cognitive deficits. She required minA for bed mobility and step pivot transfer to recliner with use of RW. Requires cues for sequencing transfer to recliner due to leaving R foot behind and needing assist with RW. Patient will benefit from skilled PT services during acute stay to address listed deficits. Recommend HHPT at discharge to maximize functional mobility and safety.         Recommendations for follow up therapy are one component of a multi-disciplinary discharge planning process, led by the attending physician.  Recommendations may be updated based on patient status, additional functional criteria and insurance authorization.  Follow Up Recommendations Home health PT      Assistance Recommended at Discharge Frequent or constant Supervision/Assistance  Patient can return home with the following  A little help with walking and/or transfers;A little help with bathing/dressing/bathroom;Assistance with cooking/housework;Direct supervision/assist for financial management;Direct supervision/assist for medications management;Assist for transportation;Help with stairs or ramp for entrance     Equipment Recommendations None recommended by PT  Recommendations for Other Services       Functional Status Assessment Patient has had a recent decline in their functional status and demonstrates the ability to make significant improvements in function in a reasonable and predictable amount of time.     Precautions / Restrictions Precautions Precautions: Fall Precaution Comments: on 3L O2 at baseline Restrictions Weight Bearing Restrictions: No      Mobility  Bed Mobility Overal bed mobility: Needs Assistance Bed Mobility: Supine to Sit     Supine to sit: Min assist     General bed mobility comments: assist for trunk elevation with patient reaching for therapist hand for assist    Transfers Overall transfer level: Needs assistance Equipment used: Rolling [place] (2 wheels) Transfers: Sit to/from Stand, Bed to chair/wheelchair/BSC Sit to Stand: Min guard   Step pivot transfers: Min assist       General transfer comment: min guard for safety. Cues for hand placement. MinA for step pivot transfer to recliner with RW. Assist for balance and RW management. Cues for sequencing at times due to leaving R foot behind. Patient reports her son or daughter in law have chair follow if she ambulates in the home    Ambulation/Gait                  Stairs            Wheelchair Mobility    Modified Rankin (Stroke Patients Only)       Balance Overall balance assessment: Needs assistance Sitting-balance support: No upper extremity supported, Feet supported Sitting balance-Leahy Scale: Fair     Standing balance support: Bilateral upper extremity supported, Reliant on assistive device for balance Standing balance-Leahy Scale: Poor Standing balance comment: reliant on RW for support  Pertinent Vitals/Pain Pain Assessment Pain Assessment: No/denies pain    Home Living Family/patient expects to be discharged to:: Private  residence Living Arrangements: Children Available Help at Discharge: Family;Available 24 hours/day Type of Home: House Home Access: Ramped entrance       Home Layout: One level Home Equipment: Conservation officer, nature (2 wheels);Wheelchair - manual;Shower seat      Prior Function Prior Level of Function : Needs assist             Mobility Comments: primarily uses w/c but ambulates short distances with RW ADLs Comments: family assists with getting in/out of shower but able to complete bathing without assistance     Hand Dominance        Extremity/Trunk Assessment   Upper Extremity Assessment Upper Extremity Assessment: Defer to OT evaluation    Lower Extremity Assessment Lower Extremity Assessment: Generalized weakness    Cervical / Trunk Assessment Cervical / Trunk Assessment: Kyphotic  Communication   Communication: No difficulties  Cognition Arousal/Alertness: Awake/alert Behavior During Therapy: WFL for tasks assessed/performed Overall Cognitive Status: History of cognitive impairments - at baseline                                 General Comments: hx of dementia. A&Ox3 during session. Follows commands appropriately.        General Comments General comments (skin integrity, edema, etc.): On 3L O2, spO2 94% or greater throughout    Exercises     Assessment/Plan    PT Assessment Patient needs continued PT services  PT Problem List Decreased strength;Decreased activity tolerance;Decreased balance;Decreased mobility;Decreased cognition;Decreased knowledge of use of DME;Decreased safety awareness;Cardiopulmonary status limiting activity;Decreased knowledge of precautions       PT Treatment Interventions DME instruction;Gait training;Functional mobility training;Therapeutic activities;Therapeutic exercise;Balance training;Patient/family education    PT Goals (Current goals can be found in the Care Plan section)  Acute Rehab PT Goals Patient Stated  Goal: to get dressed and go home PT Goal Formulation: With patient/family Time For Goal Achievement: 07/07/22 Potential to Achieve Goals: Fair    Frequency Min 2X/week     Co-evaluation               AM-PAC PT "6 Clicks" Mobility  Outcome Measure Help needed turning from your back to your side while in a flat bed without using bedrails?: A Little Help needed moving from lying on your back to sitting on the side of a flat bed without using bedrails?: A Little Help needed moving to and from a bed to a chair (including a wheelchair)?: A Little Help needed standing up from a chair using your arms (e.g., wheelchair or bedside chair)?: A Little Help needed to walk in hospital room?: A Lot Help needed climbing 3-5 steps with a railing? : Total 6 Click Score: 15    End of Session Equipment Utilized During Treatment: Oxygen Activity Tolerance: Patient tolerated treatment well Patient left: in chair;with call bell/phone within reach;with chair alarm set Nurse Communication: Mobility status PT Visit Diagnosis: Unsteadiness on feet (R26.81);Muscle weakness (generalized) (M62.81);Difficulty in walking, not elsewhere classified (R26.2)    Time: 7591-6384 PT Time Calculation (min) (ACUTE ONLY): 26 min   Charges:   PT Evaluation $PT Eval Moderate Complexity: 1 Mod PT Treatments $Therapeutic Activity: 8-22 mins        Kindra Bickham A. Gilford Rile PT, DPT [facility] - Acute Rehabilitation Services   Sheketa Ende A Rayanna Matusik 06/23/2022, 10:14 AM

## 2022-06-23 NOTE — Evaluation (Signed)
Occupational Therapy Evaluation Patient Details Name: Amber [name] MRN: [id number] DOB: 11/06/1929 Today's Date: 06/23/2022   History of Present Illness 87 y/o female presented to ED on 06/20/22 for AMS and SOB. Admitted for CHF exacerbation and hypoglycemia. PMH: dementia, HTN, T2DM, Afib, CAD, TIA, pacemaker placement 2/2 complete heart block   Clinical Impression   Patient seen for OT evaluation, DIL present. Pt presenting with decreased independence in self care, balance, functional mobility/transfers, endurance, and safety awareness. Pt with h/o dementia at baseline, PLOF obtained from DIL. Prior to admission, pt lived with son and DIL. Family assists with shower transfer, dressing PRN, and all IADLs. Pt primarily uses w/c for functional mobility, however, ambulates short distances with RW. Pt A&Ox3 and on 3L O2 via [place] throughout. Pt currently functioning at [place] guard to stand from recliner, Min A to take steps toward EOB using RW, and Mod A to return to supine. Pt will benefit from acute OT to increase overall independence in the areas of ADLs and functional mobility in order to safely discharge home. Pt could benefit from [facility] following D/C to decrease falls risk, improve balance, and maximize independence in self-care within own home environment.     Recommendations for follow up therapy are one component of a multi-disciplinary discharge planning process, led by the attending physician.  Recommendations may be updated based on patient status, additional functional criteria and insurance authorization.   Follow Up Recommendations  Home health OT     Assistance Recommended at Discharge Frequent or constant Supervision/Assistance  Patient can return home with the following A little help with walking and/or transfers;A lot of help with bathing/dressing/bathroom;Assistance with cooking/housework;Assist for transportation;Help with stairs or ramp for entrance;Direct supervision/assist  for financial management;Direct supervision/assist for medications management    Functional Status Assessment  Patient has had a recent decline in their functional status and demonstrates the ability to make significant improvements in function in a reasonable and predictable amount of time.  Equipment Recommendations  None recommended by OT    Recommendations for Other Services       Precautions / Restrictions Precautions Precautions: Fall Precaution Comments: on 3L O2 at baseline Restrictions Weight Bearing Restrictions: No      Mobility Bed Mobility Overal bed mobility: Needs Assistance Bed Mobility: Sit to Supine       Sit to supine: Mod assist   General bed mobility comments: assist to manage BLE and adjust trunk    Transfers Overall transfer level: Needs assistance Equipment used: Rolling walker (2 wheels) Transfers: Sit to/from Stand, Bed to chair/wheelchair/BSC Sit to Stand: Min guard     Step pivot transfers: Min assist     General transfer comment: VC for hand placement, assist for RW management      Balance Overall balance assessment: Needs assistance Sitting-balance support: No upper extremity supported, Feet supported Sitting balance-Leahy Scale: Fair     Standing balance support: Bilateral upper extremity supported, Reliant on assistive device for balance Standing balance-Leahy Scale: Poor                             ADL either performed or assessed with clinical judgement   ADL Overall ADL's : Needs assistance/impaired     Grooming: Set up;Sitting Grooming Details (indicate cue type and reason): anticipate         Upper Body Dressing : Minimal assistance;Sitting Upper Body Dressing Details (indicate cue type and reason): anticipate Lower Body Dressing:  Maximal assistance Lower Body Dressing Details (indicate cue type and reason): anticipate Toilet Transfer: Min guard;Minimal assistance;Ambulation;Rolling walker (2  wheels) Toilet Transfer Details (indicate cue type and reason): simulated with short ambulatory transfer from recliner>EOB Toileting- Clothing Manipulation and Hygiene: Maximal assistance;Sit to/from stand Toileting - Clothing Manipulation Details (indicate cue type and reason): anticipate             Vision Patient Visual Report: No change from baseline       Perception     Praxis      Pertinent Vitals/Pain Pain Assessment Pain Assessment: No/denies pain     Hand Dominance Right   Extremity/Trunk Assessment Upper Extremity Assessment Upper Extremity Assessment: Generalized weakness   Lower Extremity Assessment Lower Extremity Assessment: Generalized weakness   Cervical / Trunk Assessment Cervical / Trunk Assessment: Kyphotic   Communication Communication Communication: No difficulties   Cognition Arousal/Alertness: Awake/alert Behavior During Therapy: WFL for tasks assessed/performed Overall Cognitive Status: History of cognitive impairments - at baseline                                 General Comments: hx of dementia. A&Ox3 during session. Follows commands appropriately.     General Comments  SpO2 maintained >90% on 3L O2 via [place]    Exercises Other Exercises Other Exercises: OT provided education re: role of OT, OT POC, post acute recs, sitting up for all meals, EOB/OOB mobility with assistance, home/fall safety.     Shoulder Instructions      Home Living Family/patient expects to be discharged to:: Private residence Living Arrangements: Children Available Help at Discharge: Family;Available 24 hours/day Type of Home: House Home Access: Ramped entrance     Home Layout: One level     Bathroom Shower/Tub: Occupational psychologist: Standard (reports having frame with rails over toilet; has BSC in bedroom)     Home Equipment: Conservation officer, nature (2 wheels);Wheelchair - manual;Shower seat;BSC/3in1          Prior  Functioning/Environment Prior Level of Function : Needs assist             Mobility Comments: primarily uses w/c but ambulates short distances with RW ADLs Comments: family assists with getting in/out of shower but able to complete bathing without assistance, assist with dressing PRN, DIL provides assistance for all IADLs including transportation        OT Problem List: Decreased strength;Decreased activity tolerance;Impaired balance (sitting and/or standing);Cardiopulmonary status limiting activity;Decreased cognition;Decreased safety awareness;Decreased knowledge of use of DME or AE;Decreased knowledge of precautions      OT Treatment/Interventions: Self-care/ADL training;Therapeutic exercise;Therapeutic activities;Cognitive remediation/compensation;Energy conservation;DME and/or AE instruction;Patient/family education;Balance training    OT Goals(Current goals can be found in the care plan section) Acute Rehab OT Goals Patient Stated Goal: go home OT Goal Formulation: With patient/family Time For Goal Achievement: 07/07/22 Potential to Achieve Goals: Fair   OT Frequency: Min 2X/week    Co-evaluation              AM-PAC OT "6 Clicks" Daily Activity     Outcome Measure Help from another person eating meals?: None Help from another person taking care of personal grooming?: A Little Help from another person toileting, which includes using toliet, bedpan, or urinal?: A Lot Help from another person bathing (including washing, rinsing, drying)?: A Lot Help from another person to put on and taking off regular upper body clothing?: A Little Help from another  person to put on and taking off regular lower body clothing?: A Lot 6 Click Score: 16   End of Session Equipment Utilized During Treatment: Gait belt;Rolling walker (2 wheels) Nurse Communication: Mobility status  Activity Tolerance: Patient limited by fatigue;Patient tolerated treatment well Patient left: in bed;with  call bell/phone within reach;with bed alarm set;with family/visitor present  OT Visit Diagnosis: Unsteadiness on feet (R26.81);Muscle weakness (generalized) (M62.81)                Time: 9702-6378 OT Time Calculation (min): 18 min Charges:  OT General Charges $OT Visit: 1 Visit OT Evaluation $OT Eval Moderate Complexity: 1 Mod  [facility] MS, OTR/L ascom [phone]  06/23/22, 3:12 PM

## 2022-06-23 NOTE — Discharge Instructions (Signed)

## 2022-06-23 NOTE — Care Management Important Message (Signed)
Important Message  Patient Details  Name: Amber [name] MRN: [id number] Date of Birth: 1930/05/30   Medicare Important Message Given:  Yes  Reviewed Medicare IM with [name], daughter, at [phone].  Copy of Medicare IM placed in mail to home address on file.   Dannette Barbara 06/23/2022, 3:46 PM

## 2022-06-23 NOTE — Consult Note (Signed)
   Heart Failure Nurse Navigator Note  HFrEF 30-35%.Mild mitral regurgitation.   She presented to the emergency room with altered mental statis and shortness of breath.  This x-ray revealed pulmonary edema.  BNP 1239.  Comorbidities:  Anemia Anxiety Coronary artery disease Diabetes Hypertension Hyperlipidemia  Medications:  Apixaban 5 mg 2 times a day Carvedilol 3.125 mg 2 times a day Jardiance 10 mg daily Furosemide 20 mg orally 2 times a day Lisinopril 10 mg daily Pravastatin 40 mg every evening  Labs:  Sodium 136, potassium 3.9, chloride 99, CO2 27, BUN 24, creatinine 0.9, estimated GFR greater than 60, magnesium 1.9. Weight is 71.4 kg Intake 100 Output 900 mL  Meeting with patient and her caregiver who was at the bedside.  They states she is being discharged home today.  Today is her 89th birthday.  States that she remembers being confused at home.  Went over how she takes care of herself at home.  Caregiver states that they weigh her daily.  Reminded to call if 2 pound weight gain overnight or 5 pounds within the week.  They do not add salt at the table or with with they are preparing.  Went over fluid restriction to 64 ounces.  Caregiver states that she drinks two 8 ounce glasses of tea and 2-16 ounce bottles of bottled water.  Made aware of follow-up in the outpatient heart failure clinic, they have not appointment for January 5 at 10:30 AM.  Pricilla Riffle RN [facility]

## 2022-06-23 NOTE — Discharge Summary (Signed)
Physician Discharge Summary   Patient: Amber [name] MRN: [id number] DOB: 1930-04-10  Admit date:     06/20/2022  Discharge date: 06/23/22  Discharge Physician: Ezekiel Slocumb   PCP: Idelle Crouch, MD   Recommendations at discharge:    Follow up with Primary care in 1 week Follow up on glycemic control and insulin regimen.  Pt presented with critical hypoglycemia, required dextrose-10 infusion until stable.  It appears she was taking 26 units, but prescribed only 10 units.  Will discharge on only 5 units daily.  Pt and family asked to write down all glucose readings at home to bring to follow up.  Repeat BMP, CBC, Mg in 1-2 weeks   Discharge Diagnoses: Principal Problem:   Acute on chronic systolic CHF (congestive heart failure) (HCC) Active Problems:   CAD (coronary artery disease)   Atrial fibrillation, chronic (HCC)   Essential hypertension   TIA (transient ischemic attack)   Type 2 diabetes mellitus with diabetic polyneuropathy, with long-term current use of insulin (HCC)   HLD (hyperlipidemia)  Resolved Problems:   Myocardial injury   Hypoglycemia   Acute metabolic encephalopathy  Hospital Course: Amber [name] is a 88 y.o. female with medical history significant of sCHF with EF 30-35%, on 3 L oxygen at baseline, hypertension, hyperlipidemia, TIA, GERD, anxiety, pacemaker placement due to complete heart block, CAD, atrial fibrillation on Eliquis, who was admitted on 06/20/2022 for acute decompensation of systolic CHF after presenting with complaints of altered mental status and shortness breath.   History on admission: Per her daughter-in-law, at baseline, patient is alert, orientated to person and place, intermittently orientated to time.  She had worsening confusion x 2 days along with decreased PO intake due to poor appetite.   Patient was found to have hypoglycemia with CBG 3 and 54 which improved to 147 after giving 250 cc of D50 per EMS.  But her  blood sugar dropped to 70 on repeat.  She was started on infusion of D10w and admitted to the hospital.  Started on IV diuresis as well due to signs of heart failure decompensation with interstitial edema on CXR, elevated BNP 1239.   12/16: stopped D10, sugars stable but still lethargic. IV diuresis. 12/17: mental status at baseline, sugars stable, stop diuretics & monitor volume & respiratory status, renal function  12/18: pt remains at baseline mental status, no further recurrence of hypoglycemia. Daughter at bedside.  PT/OT evaluated, HH recommended and arranged. Patient stable for discharge home.  Will send on very little insulin and advised close monitoring of glucose at home and very close PCP follow up to adjust as needed.  Appeared she was taking 26 units but prescribed 10 units, per med history obtained by our pharmacy staff.     Assessment and Plan: * Acute on chronic systolic CHF (congestive heart failure) (HCC) Treated with Lasix 40 mg IV BID Held diuretics yesterday. Resume home Lasix today. Monitor renal function, electrolytes, BP Strict I/O's, daily weights Fluid restriction, low sodium diet  CAD (coronary artery disease) On Eliquis, not on antiplatelet therapy. Continue statin, Coreg   Myocardial injury-resolved as of 06/23/2022 Elevated troponin on admission likely demand ischemia from decompensated heart failure.  No chest pain or acute ischemic changes on EKG.  Atrial fibrillation, chronic (HCC) Continue Eliquis. Continue Coreg, reduced dose & hold parameters for low BP's Telemetry  Hypoglycemia-resolved as of 06/23/2022 Glucose this afternoon in 200's. Stop D10 infusion Continue frequent CBG's, then go to every 4  hours No longer needs stepdown, admit to [facility]. Monitor closely. 12/17: sugars have been stable in 295'J   Acute metabolic encephalopathy-resolved as of 06/23/2022 Resolved.  POA, due to hypoglycemia.  CT head was negative.  UA negative for  infection.  Baseline dementia with worsened confusion x 2 days prior to admission per family. 12/16: pt still lethargic but does respond 12/17: mental status greatly improved, alert, talkative, seems at baseline --Mgmt of underlying issues as outlined --Neuro checks --Delirium precautions  Essential hypertension BP's have been very labile, at times low with MAP in 50's but other times significantly elevated.   Coreg dose reduced & hold parameter for MAP<65  TIA (transient ischemic attack) Continue Eliquis, statin  Type 2 diabetes mellitus with diabetic polyneuropathy, with long-term current use of insulin (HCC) Hypoglycemic on admission. Stopping dextrose infusion this afternoon. Hold off insulin for now and start sliding scale coverage if CBG's no longer dropping. CBG's AC/HS  HLD (hyperlipidemia) On statin         Consultants: None Procedures performed: None  Disposition: Home health Diet recommendation:  Cardiac and Carb modified diet DISCHARGE MEDICATION: Allergies as of 06/23/2022       Reactions   Ambien [zolpidem Tartrate] Other (See Comments)   Reaction:  Keeps pt awake    Penicillins Itching, Other (See Comments)   Has patient had a PCN reaction causing immediate rash, facial/tongue/throat swelling, SOB or lightheadedness with hypotension: No Has patient had a PCN reaction causing severe rash involving mucus membranes or skin necrosis: No Has patient had a PCN reaction that required hospitalization No Has patient had a PCN reaction occurring within the last 10 years: No If all of the above answers are "NO", then may proceed with Cephalosporin use.   Iodine Itching   Succinylcholine Other (See Comments)   Reaction:  Unknown    Etodolac Other (See Comments)   GI upset   Nsaids Other (See Comments)   Reaction: gi upset        Medication List     TAKE these medications    acetaminophen 500 MG tablet Commonly known as: TYLENOL Take 1 tablet (500 mg  total) by mouth every 6 (six) hours as needed for mild pain or headache.   albuterol 108 (90 Base) MCG/ACT inhaler Commonly known as: VENTOLIN HFA Inhale 2 puffs into the lungs every 6 (six) hours as needed for wheezing or shortness of breath.   alendronate 70 MG tablet Commonly known as: FOSAMAX Take 70 mg by mouth once a week.   apixaban 5 MG Tabs tablet Commonly known as: ELIQUIS Take 5 mg by mouth 2 (two) times daily.   calcium-vitamin D 500-200 MG-UNIT tablet Commonly known as: OSCAL WITH D Take 1 tablet by mouth daily.   carvedilol 6.25 MG tablet Commonly known as: COREG Take 1 tablet (6.25 mg total) by mouth 2 (two) times daily with a meal.   cyanocobalamin 1000 MCG tablet Take 1 tablet (1,000 mcg total) by mouth daily.   furosemide 20 MG tablet Commonly known as: Lasix Take 1 tablet (20 mg total) by mouth 2 (two) times daily. What changed: when to take this   gabapentin 100 MG capsule Commonly known as: NEURONTIN Take 100 mg by mouth 3 (three) times daily.   insulin glargine 100 UNIT/ML injection Commonly known as: LANTUS Inject 0.05 mLs (5 Units total) into the skin daily. Hold if blood sugar less than 120 or if not eating well. What changed:  how much to take additional instructions  Jardiance 10 MG Tabs tablet Generic drug: empagliflozin Take 10 mg by mouth daily.   levETIRAcetam 500 MG tablet Commonly known as: KEPPRA Take 500 mg by mouth 2 (two) times daily.   lisinopril 10 MG tablet Commonly known as: ZESTRIL Take 10 mg by mouth daily.   omeprazole 40 MG capsule Commonly known as: PRILOSEC Take 40 mg by mouth daily.   pravastatin 40 MG tablet Commonly known as: PRAVACHOL Take 40 mg by mouth at bedtime.   PreserVision AREDS 2 Caps Take 1 capsule by mouth daily.        Discharge Exam: Filed Weights   06/22/22 0403 06/23/22 0403  Weight: 71.3 kg 71.4 kg   General exam: awake, alert, no acute distress HEENT: wearing nasal cannula,  moist mucus membranes, hearing grossly normal  Respiratory system: CTAB, no wheezes, rales or rhonchi, normal respiratory effort. Cardiovascular system: normal S1/S2, RRR, mild non-pitting LE edema.   Gastrointestinal system: soft, NT, ND, no HSM felt, +bowel sounds. Central nervous system: A&O x3. no gross focal neurologic deficits, normal speech Skin: dry, intact, normal temperature, normal color, No rashes, lesions or ulcers Psychiatry: normal mood, congruent affect, judgement and insight appear normal   Condition at discharge: stable  The results of significant diagnostics from this hospitalization (including imaging, microbiology, ancillary and laboratory) are listed below for reference.   Imaging Studies: CT HEAD WO CONTRAST (5MM)  Result Date: 06/20/2022 CLINICAL DATA:  Provided history: Mental status change, unknown cause. Additional history provided: Altered mental status. EXAM: CT HEAD WITHOUT CONTRAST TECHNIQUE: Contiguous axial images were obtained from the base of the skull through the vertex without intravenous contrast. RADIATION DOSE REDUCTION: This exam was performed according to the departmental dose-optimization program which includes automated exposure control, adjustment of the mA and/or kV according to patient size and/or use of iterative reconstruction technique. COMPARISON:  Prior head CT examinations 06/17/2021 and earlier. FINDINGS: Mildly motion degraded exam. Brain: Cerebral atrophy. Advanced patchy and ill-defined hypoattenuation within the cerebral white matter, nonspecific but compatible with chronic small vessel disease. There is no acute intracranial hemorrhage. No demarcated cortical infarct. No extra-axial fluid collection. No evidence of an intracranial mass. No midline shift. Vascular: No hyperdense vessel. Atherosclerotic calcifications. Skull: No fracture or aggressive osseous lesion. Sinuses/Orbits: No mass or acute finding within the imaged orbits. Trace  mucosal thickening within the left ethmoid, right sphenoid and left maxillary sinuses. Small-volume frothy secretions within the left sphenoid sinus. IMPRESSION: 1. Mildly motion degraded exam. 2. Advanced chronic small vessel ischemic changes within the cerebral white matter. 3. Cerebral atrophy. 4. Mild paranasal sinus disease, as described. Electronically Signed   By: Kellie Simmering D.O.   On: 06/20/2022 12:41   DG Chest Portable 1 View  Result Date: 06/20/2022 CLINICAL DATA:  Day history of altered mental status EXAM: PORTABLE CHEST 1 VIEW COMPARISON:  Chest radiograph dated 06/17/2021 FINDINGS: Left chest wall pacemaker lead projects over the right ventricle. Slightly low lung volumes. Mild bilateral interstitial and patchy opacities. No pleural effusion or pneumothorax. Similar enlarged postoperative cardiomediastinal silhouette. Median sternotomy wires are nondisplaced. Unchanged fracture of the inferior most wire. Partially imaged left shoulder arthroplasty. Hardware appears intact. Old healed left rib fractures. IMPRESSION: Mild bilateral interstitial and patchy opacities, which may represent pulmonary edema or atypical infection. Electronically Signed   By: Darrin Nipper M.D.   On: 06/20/2022 11:58    Microbiology: Results for orders placed or performed during the hospital encounter of 06/20/22  Resp panel by RT-PCR (RSV, Flu  A&B, Covid) Anterior Nasal Swab     Status: None   Collection Time: 06/20/22 11:51 AM   Specimen: Anterior Nasal Swab  Result Value Ref Range Status   SARS Coronavirus 2 by RT PCR NEGATIVE NEGATIVE Final   Influenza A by PCR NEGATIVE NEGATIVE Final   Influenza B by PCR NEGATIVE NEGATIVE Final   Resp Syncytial Virus by PCR NEGATIVE NEGATIVE Final    Comment: Performed at [facility], [address] [place], [place] [address]    Labs: CBC: Recent Labs  Lab 06/20/22 1151  WBC 9.7  NEUTROABS 7.4  HGB 14.4  HCT 45.4  MCV 90.6  PLT 109   Basic Metabolic  Panel: Recent Labs  Lab 06/20/22 1151 06/21/22 0430 06/22/22 0443 06/23/22 0321  NA 133* 135 136 136  K 3.5 3.3* 3.7 3.9  CL 97* 99 98 99  CO2 '23 26 26 27  '$ GLUCOSE 81 114* 118* 157*  BUN '16 15 16 '$ 24*  CREATININE 0.68 0.85 1.01* 0.90  CALCIUM 8.8* 8.7* 8.5* 9.2  MG  --  1.8 1.8 1.9  PHOS  --   --  2.8  --    Liver Function Tests: Recent Labs  Lab 06/20/22 1151  AST 49*  ALT 32  ALKPHOS 67  BILITOT 1.8*  PROT 7.1  ALBUMIN 3.6   CBG: Recent Labs  Lab 06/22/22 1700 06/22/22 2133 06/23/22 0746 06/23/22 1145 06/23/22 1524  GLUCAP 197* 213* 135* 209* 163*    Discharge time spent: less than 30 minutes.  Signed: Ezekiel Slocumb, DO [facility] 06/23/2022

## 2022-06-23 NOTE — TOC Transition Note (Signed)
Transition of Care [facility]) - CM/SW Discharge Note   Patient Details  Name: Britaney Espaillat MRN: [id number] Date of Birth: 06-Feb-1930  [facility] CM/SW Contact:  Tiburcio Bash, LCSW Phone Number: 06/23/2022, 1:28 PM   Clinical Narrative:     CSW spoke with patient's daughter [name] regarding home health recommendations. She's in agreement with preference for [facility] as they had them in the past for [facility] PT and RN. Referral sent to [facility] with [facility].   Reports no dme needs, has walker, wheel chair, shower seat at home.   No further dc needs identified.    Final next level of care: [place] Barriers to Discharge: No Barriers Identified   Patient Goals and CMS Choice Patient states their goals for this hospitalization and ongoing recovery are:: to go home CMS Medicare.gov Compare Post Acute Care list provided to:: Patient Choice offered to / list presented to : Patient  Discharge Placement                       Discharge Plan and Services                          HH Arranged: PT, RN [facility] Agency: [facility] Date [facility] Agency Contacted: 06/23/22 Time [place]: [id number] Representative spoke with at [place]: [place]  Social Determinants of Health ([place]) Interventions     Readmission Risk Interventions     No data to display

## 2022-07-09 ENCOUNTER — Encounter: Payer: Medicare HMO | Admitting: Family

## 2022-07-11 ENCOUNTER — Ambulatory Visit: Payer: Medicare Other | Attending: Family | Admitting: Family

## 2022-07-11 ENCOUNTER — Encounter: Payer: Self-pay | Admitting: Family

## 2022-07-11 ENCOUNTER — Telehealth: Payer: Self-pay

## 2022-07-11 ENCOUNTER — Encounter: Payer: Medicare HMO | Admitting: Family

## 2022-07-11 VITALS — BP 152/90 | HR 78 | Resp 16 | Wt 161.2 lb

## 2022-07-11 DIAGNOSIS — I2510 Atherosclerotic heart disease of native coronary artery without angina pectoris: Secondary | ICD-10-CM | POA: Diagnosis not present

## 2022-07-11 DIAGNOSIS — Z7951 Long term (current) use of inhaled steroids: Secondary | ICD-10-CM | POA: Diagnosis not present

## 2022-07-11 DIAGNOSIS — N189 Chronic kidney disease, unspecified: Secondary | ICD-10-CM | POA: Diagnosis not present

## 2022-07-11 DIAGNOSIS — R058 Other specified cough: Secondary | ICD-10-CM | POA: Insufficient documentation

## 2022-07-11 DIAGNOSIS — Z86010 Personal history of colonic polyps: Secondary | ICD-10-CM | POA: Diagnosis not present

## 2022-07-11 DIAGNOSIS — I10 Essential (primary) hypertension: Secondary | ICD-10-CM | POA: Diagnosis not present

## 2022-07-11 DIAGNOSIS — Z794 Long term (current) use of insulin: Secondary | ICD-10-CM | POA: Diagnosis not present

## 2022-07-11 DIAGNOSIS — M1990 Unspecified osteoarthritis, unspecified site: Secondary | ICD-10-CM | POA: Diagnosis not present

## 2022-07-11 DIAGNOSIS — R5383 Other fatigue: Secondary | ICD-10-CM | POA: Insufficient documentation

## 2022-07-11 DIAGNOSIS — Z950 Presence of cardiac pacemaker: Secondary | ICD-10-CM | POA: Diagnosis not present

## 2022-07-11 DIAGNOSIS — F419 Anxiety disorder, unspecified: Secondary | ICD-10-CM | POA: Diagnosis not present

## 2022-07-11 DIAGNOSIS — Z951 Presence of aortocoronary bypass graft: Secondary | ICD-10-CM | POA: Insufficient documentation

## 2022-07-11 DIAGNOSIS — I5022 Chronic systolic (congestive) heart failure: Secondary | ICD-10-CM | POA: Diagnosis present

## 2022-07-11 DIAGNOSIS — E119 Type 2 diabetes mellitus without complications: Secondary | ICD-10-CM | POA: Diagnosis not present

## 2022-07-11 DIAGNOSIS — I130 Hypertensive heart and chronic kidney disease with heart failure and stage 1 through stage 4 chronic kidney disease, or unspecified chronic kidney disease: Secondary | ICD-10-CM | POA: Insufficient documentation

## 2022-07-11 DIAGNOSIS — Z79899 Other long term (current) drug therapy: Secondary | ICD-10-CM | POA: Diagnosis not present

## 2022-07-11 DIAGNOSIS — D649 Anemia, unspecified: Secondary | ICD-10-CM | POA: Insufficient documentation

## 2022-07-11 DIAGNOSIS — E1122 Type 2 diabetes mellitus with diabetic chronic kidney disease: Secondary | ICD-10-CM | POA: Insufficient documentation

## 2022-07-11 DIAGNOSIS — E785 Hyperlipidemia, unspecified: Secondary | ICD-10-CM | POA: Diagnosis not present

## 2022-07-11 DIAGNOSIS — K219 Gastro-esophageal reflux disease without esophagitis: Secondary | ICD-10-CM | POA: Diagnosis not present

## 2022-07-11 MED ORDER — ENTRESTO 24-26 MG PO TABS: 1.0000 | ORAL_TABLET | Freq: Two times a day (BID) | ORAL | 0 refills | Status: DC

## 2022-07-11 NOTE — Patient Instructions (Addendum)
Begin weighing daily and call for an overnight weight gain of 3 pounds or more or a weekly weight gain of more than 5 pounds.   If you have voicemail, please make sure your mailbox is cleaned out so that we may leave a message and please make sure to listen to any voicemails.     

## 2022-07-11 NOTE — Telephone Encounter (Signed)
Spoke with patient's daughter, [name], to review medication changes per Darylene Price, Van. Patient is to begin entresto 24/26 MG. Prescription sent to [facility]. Pt to stop lisinopril 10 mg daily and then skip one day, and then begin entresto tablets. Patient's daughter verbalized understanding and had no questions or concerns at this time. Georg Ruddle, RN

## 2022-07-11 NOTE — Progress Notes (Unsigned)
Patient ID: Amber [name], female    DOB: 1929-10-01, 89 y.o.   MRN: [id number]  HPI  Amber [name] is a 89 y/o female with a history of hyperlipidemia, HTN, DM, CAD, chronic pain, CKD, arthritis, anxiety, anemia and chronic heart failure.   Echo 02/06/22 showed an EF of 25% along with moderate LVH. Echo report from 12/06/19 reviewed and showed an EF of 55-60% along with mild LVH, moderate LAE and mild MR. Reviewed echo done 01/27/17 which showed an EF of 40-45% along with mild MR. Previous echo done 07/27/16 showed an EF of 35-40% along with moderate MR/TR. This is essentially unchanged from a previous echo done August 2017.  Admitted 06/20/22 due to acute on chronic HF. Found to be hypoglycemic. Discharged after 3 days.   She presents today for a follow-up visit although hasn't been seen since 01/2021. She presents with a chief complaint of a dry cough. Says that it's intermittent in nature. Has associated fatigue, joint pain and difficulty sleeping along with this. Denies any dizziness, abdominal distention, palpitations, pedal edema, chest pain or shortness of breath.   Overall she says that she feels much better since her recent admission.   Past Medical History:  Diagnosis Date   Acute metabolic encephalopathy 88/89/1694   Anemia    Anxiety    Arthritis    Benign neoplasm of colon    CHF (congestive heart failure) (HCC)    Chronic kidney disease    Chronic pain    Complication of anesthesia 1980's   hard time waking up   Coronary artery disease with unspecified angina pectoris    Cough    Diabetes mellitus without complication ([place])    Essential hypertension    GERD (gastroesophageal reflux disease)    High risk medication use    Hyperlipidemia    Hypoglycemia 06/20/2022   Myocardial injury 06/20/2022   Plantar fascial fibromatosis    Presence of permanent cardiac pacemaker    Shortness of breath dyspnea    Past Surgical History:  Procedure Laterality Date   ABDOMINAL  HYSTERECTOMY     BACK SURGERY  1960's   cage and screws in lower back   CATARACT EXTRACTION W/ INTRAOCULAR LENS  IMPLANT, BILATERAL Bilateral    CORONARY ARTERY BYPASS GRAFT  2008   triple   HARDWARE REMOVAL Right 12/08/2019   Procedure: HARDWARE REMOVAL;  Surgeon: Hessie Knows, MD;  Location: [facility];  Service: Orthopedics;  Laterality: Right;   JOINT REPLACEMENT Right    hip and knee   ORIF ANKLE FRACTURE Right 07/27/2014   Procedure: OPEN REDUCTION INTERNAL FIXATION (ORIF) ANKLE FRACTURE;  Surgeon: Alta Corning, MD;  Location: [facility];  Service: Orthopedics;  Laterality: Right;   ORIF TIBIA FRACTURE Right 12/08/2019   Procedure: OPEN REDUCTION INTERNAL FIXATION (ORIF) TIBIA FRACTURE;  Surgeon: Hessie Knows, MD;  Location: [facility];  Service: Orthopedics;  Laterality: Right;   PACEMAKER INSERTION Left 03/14/2015   Procedure: INSERTION PACEMAKER;  Surgeon: Isaias Cowman, MD;  Location: [facility];  Service: Cardiovascular;  Laterality: Left;   PACEMAKER LEADLESS INSERTION N/A 01/24/2020   Procedure: PACEMAKER LEADLESS INSERTION;  Surgeon: Isaias Cowman, MD;  Location: [facility];  Service: Cardiovascular;  Laterality: N/A;   ROTATOR CUFF REPAIR Left    TOTAL HIP ARTHROPLASTY Right    TOTAL HIP ARTHROPLASTY Left 02/09/2018   Procedure: TOTAL HIP ARTHROPLASTY ANTERIOR APPROACH;  Surgeon: Hessie Knows, MD;  Location: [facility];  Service: Orthopedics;  Laterality: Left;  TOTAL KNEE ARTHROPLASTY Right    VEIN LIGATION AND STRIPPING     Family History  Problem Relation Age of Onset   Heart attack Mother    Heart disease Father    Alzheimer's disease Sister    Cervical cancer Sister    Heart failure Son    Social History   Tobacco Use   Smoking status: Never   Smokeless tobacco: Never  Substance Use Topics   Alcohol use: No   Allergies  Allergen Reactions   Ambien [Zolpidem Tartrate] Other (See Comments)    Reaction:  Keeps pt awake    Penicillins Itching and  Other (See Comments)    Has patient had a PCN reaction causing immediate rash, facial/tongue/throat swelling, SOB or lightheadedness with hypotension: No Has patient had a PCN reaction causing severe rash involving mucus membranes or skin necrosis: No Has patient had a PCN reaction that required hospitalization No Has patient had a PCN reaction occurring within the last 10 years: No If all of the above answers are "NO", then may proceed with Cephalosporin use.   Iodine Itching   Succinylcholine Other (See Comments)    Reaction:  Unknown    Etodolac Other (See Comments)    GI upset   Nsaids Other (See Comments)    Reaction: gi upset   Prior to Admission medications   Medication Sig Start Date End Date Taking? Authorizing Provider  acetaminophen (TYLENOL) 500 MG tablet Take 1 tablet (500 mg total) by mouth every 6 (six) hours as needed for mild pain or headache. 04/16/21  Yes Fritzi Mandes, MD  albuterol (PROVENTIL HFA;VENTOLIN HFA) 108 (90 Base) MCG/ACT inhaler Inhale 2 puffs into the lungs every 6 (six) hours as needed for wheezing or shortness of breath. 01/28/17  Yes Wieting, Richard, MD  alendronate (FOSAMAX) 70 MG tablet Take 70 mg by mouth once a week. 07/22/21  Yes [provider]  apixaban (ELIQUIS) 5 MG TABS tablet Take 5 mg by mouth 2 (two) times daily.   Yes [provider]  calcium-vitamin D (OSCAL WITH D) 500-200 MG-UNIT tablet Take 1 tablet by mouth daily.   Yes [provider]  carvedilol (COREG) 6.25 MG tablet Take 1 tablet (6.25 mg total) by mouth 2 (two) times daily with a meal. 01/28/17  Yes Wieting, Richard, MD  furosemide (LASIX) 20 MG tablet Take 1 tablet (20 mg total) by mouth 2 (two) times daily. Patient taking differently: Take 20 mg by mouth 2 (two) times daily. 02/27/20  Yes Para Skeans, MD  gabapentin (NEURONTIN) 100 MG capsule Take 100 mg by mouth 3 (three) times daily. 06/10/22  Yes [provider]  insulin glargine (LANTUS) 100  UNIT/ML injection Inject 0.05 mLs (5 Units total) into the skin daily. Hold if blood sugar less than 120 or if not eating well. 06/23/22  Yes Nicole Kindred A, DO  JARDIANCE 10 MG TABS tablet Take 10 mg by mouth daily.   Yes [provider]  levETIRAcetam (KEPPRA) 500 MG tablet Take 500 mg by mouth 2 (two) times daily. 02/28/22  Yes [provider]  Multiple Vitamins-Minerals (PRESERVISION AREDS 2) CAPS Take 1 capsule by mouth daily.   Yes [provider]  omeprazole (PRILOSEC) 40 MG capsule Take 40 mg by mouth daily.    Yes [provider]  pravastatin (PRAVACHOL) 40 MG tablet Take 40 mg by mouth at bedtime.   Yes [provider]  sacubitril-valsartan (ENTRESTO) 24-26 MG Take 1 tablet by mouth 2 (two)  times daily. 07/11/22  Yes Darylene Price A, FNP  vitamin B-12 1000 MCG tablet Take 1 tablet (1,000 mcg total) by mouth daily. 04/17/21  Yes Fritzi Mandes, MD    Review of Systems  Constitutional:  Positive for fatigue. Negative for appetite change.  HENT:  Negative for congestion, postnasal drip and sore throat.   Eyes: Negative.   Respiratory:  Positive for cough (dry). Negative for chest tightness and shortness of breath.   Cardiovascular:  Negative for chest pain, palpitations and leg swelling.  Gastrointestinal:  Negative for abdominal distention and abdominal pain.  Endocrine: Negative.   Genitourinary: Negative.   Musculoskeletal:  Positive for arthralgias (arms). Negative for back pain.  Skin: Negative.   Allergic/Immunologic: Negative.   Neurological:  Negative for dizziness and light-headedness.  Hematological:  Negative for adenopathy. Does not bruise/bleed easily.  Psychiatric/Behavioral:  Positive for sleep disturbance (sleeping on 1 pillow with oxygan). Negative for dysphoric mood. The patient is not nervous/anxious.    Vitals:   07/11/22 1115  BP: (!) 152/90  Pulse: 78  Resp: 16  SpO2: 91%  Weight: 161 lb 3 oz (73.1 kg)   Wt  Readings from Last 3 Encounters:  07/11/22 161 lb 3 oz (73.1 kg)  06/23/22 157 lb 8 oz (71.4 kg)  06/17/21 169 lb 5 oz (76.8 kg)   Lab Results  Component Value Date   CREATININE 0.90 06/23/2022   CREATININE 1.01 (H) 06/22/2022   CREATININE 0.85 06/21/2022   Physical Exam Vitals and nursing note reviewed. Exam conducted with a chaperone present (friend).  Constitutional:      Appearance: Normal appearance.  HENT:     Head: Normocephalic and atraumatic.  Cardiovascular:     Rate and Rhythm: Normal rate and regular rhythm.  Pulmonary:     Effort: Pulmonary effort is normal. No respiratory distress.     Breath sounds: Wheezing (expiratory throughout all lobes) present. No rales.  Abdominal:     General: There is no distension.     Palpations: Abdomen is soft.     Tenderness: There is no abdominal tenderness.  Musculoskeletal:        General: No tenderness.     Cervical back: Normal range of motion and neck supple.     Right lower leg: No edema.     Left lower leg: No edema.  Skin:    General: Skin is warm and dry.  Neurological:     General: No focal deficit present.     Mental Status: She is alert and oriented to person, place, and time.  Psychiatric:        Mood and Affect: Mood normal.        Behavior: Behavior normal.   Assessment & Plan:  1: Chronic heart failure with reduced ejection fraction- - NYHA class II - euvolemic - not weighing daily but has scales; instructed to weight daily and call for an overnight weight gain of >2 pounds or a weekly weight gain of >5 pounds.  - weight today 161.3 - not adding salt to her food and is trying to eat low sodium foods  - on GDMT of jardiance and lisinopril - RN called patient's daughter [name] who does meds for patient and instructed to stop lisinopril, skip one day and begin entresto 24/'26mg'$  BID; voucher provided - check BMP next visit if not done elsewhere - saw cardiology Nehemiah Massed) 03/04/22 - saw pulmonology Raul Del)  02/26/22 - BNP 06/20/22 was 1239.9  2: HTN- - BP 152/90 - saw  PCP ([facility]) 03/21/22 - BMP from 06/23/22 reviewed and showed sodium 136, potassium 3.9, creatinine 0.9 and GFR >60  3: DM- - A1c 06/21/22 was 6.1% - glucose at home was 136 - saw endocrinology Honor Junes) 04/18/22   Patient did not bring her medications nor a list. Each medication was verbally reviewed with the patient and she was encouraged to bring the bottles to every visit to confirm accuracy of list.  Return in 2 months, sooner if needed.

## 2022-07-12 ENCOUNTER — Encounter: Payer: Self-pay | Admitting: Family

## 2022-08-20 ENCOUNTER — Telehealth: Payer: Self-pay | Admitting: *Deleted

## 2022-08-20 NOTE — Patient Outreach (Signed)
  Care Coordination   Initial Visit Note   08/20/2022 Name: Amber [name] MRN: [id number] DOB: June 29, 1930  Amber [name] is a 89 y.o. year old female who sees [name], Amber Douglas, MD for primary care. I spoke with Amber [name], daughter of Amber [name] by phone today.  What matters to the patients health and wellness today?  Per daughter, patient is doing well.  Now has [employer] dual coverage, has first home visit tomorrow.  Reminded to get contact information for nurse navigator with [facility].  No further needs identified at this time.   SDOH assessments and interventions completed:  No     Care Coordination Interventions:  Yes, provided   Follow up plan: No further intervention required.   Encounter Outcome:  Pt. Visit Completed   Amber David, RN, MSN, [place] Care Management Care Management Coordinator [phone]

## 2022-09-09 ENCOUNTER — Ambulatory Visit: Payer: 59 | Attending: Family | Admitting: Family

## 2022-09-09 ENCOUNTER — Encounter: Payer: Self-pay | Admitting: Family

## 2022-09-09 VITALS — BP 136/70 | HR 65 | Resp 16 | Wt 159.4 lb

## 2022-09-09 DIAGNOSIS — I130 Hypertensive heart and chronic kidney disease with heart failure and stage 1 through stage 4 chronic kidney disease, or unspecified chronic kidney disease: Secondary | ICD-10-CM | POA: Insufficient documentation

## 2022-09-09 DIAGNOSIS — I5022 Chronic systolic (congestive) heart failure: Secondary | ICD-10-CM | POA: Insufficient documentation

## 2022-09-09 DIAGNOSIS — E119 Type 2 diabetes mellitus without complications: Secondary | ICD-10-CM | POA: Diagnosis not present

## 2022-09-09 DIAGNOSIS — N189 Chronic kidney disease, unspecified: Secondary | ICD-10-CM | POA: Diagnosis not present

## 2022-09-09 DIAGNOSIS — Z794 Long term (current) use of insulin: Secondary | ICD-10-CM

## 2022-09-09 DIAGNOSIS — E1122 Type 2 diabetes mellitus with diabetic chronic kidney disease: Secondary | ICD-10-CM | POA: Insufficient documentation

## 2022-09-09 DIAGNOSIS — Z79899 Other long term (current) drug therapy: Secondary | ICD-10-CM | POA: Insufficient documentation

## 2022-09-09 DIAGNOSIS — I10 Essential (primary) hypertension: Secondary | ICD-10-CM | POA: Diagnosis not present

## 2022-09-09 DIAGNOSIS — I2510 Atherosclerotic heart disease of native coronary artery without angina pectoris: Secondary | ICD-10-CM | POA: Insufficient documentation

## 2022-09-09 DIAGNOSIS — M1990 Unspecified osteoarthritis, unspecified site: Secondary | ICD-10-CM | POA: Insufficient documentation

## 2022-09-09 DIAGNOSIS — E785 Hyperlipidemia, unspecified: Secondary | ICD-10-CM | POA: Diagnosis not present

## 2022-09-09 DIAGNOSIS — G8929 Other chronic pain: Secondary | ICD-10-CM | POA: Diagnosis not present

## 2022-09-09 MED ORDER — SACUBITRIL-VALSARTAN 24-26 MG PO TABS: 1.0000 | ORAL_TABLET | Freq: Two times a day (BID) | ORAL | 3 refills | Status: DC

## 2022-09-09 NOTE — Progress Notes (Signed)
Patient ID: Amber [name], female    DOB: [birth year], 89 y.o.   MRN: [id number]  HPI  Amber [name] is a 89 y/o female with a history of hyperlipidemia, HTN, DM, CAD, chronic pain, CKD, arthritis, anxiety, anemia and chronic heart failure.   Echo 02/06/22 showed an EF of 25% along with moderate LVH. Echo report from 12/06/19 reviewed and showed an EF of 55-60% along with mild LVH, moderate LAE and mild MR. Reviewed echo done 01/27/17 which showed an EF of 40-45% along with mild MR. Previous echo done 07/27/16 showed an EF of 35-40% along with moderate MR/TR. This is essentially unchanged from a previous echo done August 2017.  Admitted 06/20/22 due to acute on chronic HF. Found to be hypoglycemic. Discharged after 3 days.   She presents today for a HF follow-up visit with a chief complaint of minimal fatigue upon moderate exertion. Describes this as chronic in nature. Has associated cough, difficulty sleeping and worsening back pain along with this. Denies any dizziness, abdominal distention, palpitations, pedal edema, chest pain, SOB or weight gain.   Has been taking a couple of BC powders daily to help with her back pain. Did emphasize that she call her PCP's office to discuss this as BC powders is not good for her.   Her pharmacy had told her that they never received the original entresto prescription so she resumed her lisinopril.    Past Medical History:  Diagnosis Date   Acute metabolic encephalopathy [id number]   Anemia    Anxiety    Arthritis    Benign neoplasm of colon    CHF (congestive heart failure) (HCC)    Chronic kidney disease    Chronic pain    Complication of anesthesia 1980's   hard time waking up   Coronary artery disease with unspecified angina pectoris    Cough    Diabetes mellitus without complication ([place])    Essential hypertension    GERD (gastroesophageal reflux disease)    High risk medication use    Hyperlipidemia    Hypoglycemia 06/20/2022   Myocardial  injury 06/20/2022   Plantar fascial fibromatosis    Presence of permanent cardiac pacemaker    Shortness of breath dyspnea    Past Surgical History:  Procedure Laterality Date   ABDOMINAL HYSTERECTOMY     BACK SURGERY  1960's   cage and screws in lower back   CATARACT EXTRACTION W/ INTRAOCULAR LENS  IMPLANT, BILATERAL Bilateral    CORONARY ARTERY BYPASS GRAFT  2008   triple   HARDWARE REMOVAL Right 12/08/2019   Procedure: HARDWARE REMOVAL;  Surgeon: Hessie Knows, MD;  Location: [facility];  Service: Orthopedics;  Laterality: Right;   JOINT REPLACEMENT Right    hip and knee   ORIF ANKLE FRACTURE Right 07/27/2014   Procedure: OPEN REDUCTION INTERNAL FIXATION (ORIF) ANKLE FRACTURE;  Surgeon: Alta Corning, MD;  Location: [facility];  Service: Orthopedics;  Laterality: Right;   ORIF TIBIA FRACTURE Right 12/08/2019   Procedure: OPEN REDUCTION INTERNAL FIXATION (ORIF) TIBIA FRACTURE;  Surgeon: Hessie Knows, MD;  Location: [facility];  Service: Orthopedics;  Laterality: Right;   PACEMAKER INSERTION Left 03/14/2015   Procedure: INSERTION PACEMAKER;  Surgeon: Isaias Cowman, MD;  Location: [facility];  Service: Cardiovascular;  Laterality: Left;   PACEMAKER LEADLESS INSERTION N/A 01/24/2020   Procedure: PACEMAKER LEADLESS INSERTION;  Surgeon: Isaias Cowman, MD;  Location: [facility];  Service: Cardiovascular;  Laterality: N/A;   ROTATOR CUFF REPAIR Left  TOTAL HIP ARTHROPLASTY Right    TOTAL HIP ARTHROPLASTY Left 02/09/2018   Procedure: TOTAL HIP ARTHROPLASTY ANTERIOR APPROACH;  Surgeon: Hessie Knows, MD;  Location: [facility];  Service: Orthopedics;  Laterality: Left;   TOTAL KNEE ARTHROPLASTY Right    VEIN LIGATION AND STRIPPING     Family History  Problem Relation Age of Onset   Heart attack Mother    Heart disease Father    Alzheimer's disease Sister    Cervical cancer Sister    Heart failure Son    Social History   Tobacco Use   Smoking status: Never   Smokeless tobacco:  Never  Substance Use Topics   Alcohol use: No   Allergies  Allergen Reactions   Ambien [Zolpidem Tartrate] Other (See Comments)    Reaction:  Keeps pt awake    Penicillins Itching and Other (See Comments)    Has patient had a PCN reaction causing immediate rash, facial/tongue/throat swelling, SOB or lightheadedness with hypotension: No Has patient had a PCN reaction causing severe rash involving mucus membranes or skin necrosis: No Has patient had a PCN reaction that required hospitalization No Has patient had a PCN reaction occurring within the last 10 years: No If all of the above answers are "NO", then may proceed with Cephalosporin use.   Iodine Itching   Succinylcholine Other (See Comments)    Reaction:  Unknown    Etodolac Other (See Comments)    GI upset   Nsaids Other (See Comments)    Reaction: gi upset   Prior to Admission medications   Medication Sig Start Date End Date Taking? Authorizing Provider  acetaminophen (TYLENOL) 500 MG tablet Take 1 tablet (500 mg total) by mouth every 6 (six) hours as needed for mild pain or headache. 04/16/21  Yes Fritzi Mandes, MD  alendronate (FOSAMAX) 70 MG tablet Take 70 mg by mouth once a week. 07/22/21  Yes [provider]  apixaban (ELIQUIS) 5 MG TABS tablet Take 5 mg by mouth 2 (two) times daily.   Yes [provider]  calcium-vitamin D (OSCAL WITH D) 500-200 MG-UNIT tablet Take 1 tablet by mouth daily.   Yes [provider]  carvedilol (COREG) 6.25 MG tablet Take 1 tablet (6.25 mg total) by mouth 2 (two) times daily with a meal. 01/28/17  Yes Wieting, Richard, MD  furosemide (LASIX) 20 MG tablet Take 1 tablet (20 mg total) by mouth 2 (two) times daily. Patient taking differently: Take 20 mg by mouth daily. 02/27/20  Yes Para Skeans, MD  gabapentin (NEURONTIN) 100 MG capsule Take 100 mg by mouth 3 (three) times daily. 06/10/22  Yes [provider]  insulin glargine (LANTUS) 100 UNIT/ML injection Inject  0.05 mLs (5 Units total) into the skin daily. Hold if blood sugar less than 120 or if not eating well. 06/23/22  Yes Nicole Kindred A, DO  JARDIANCE 10 MG TABS tablet Take 10 mg by mouth daily.   Yes [provider]  levETIRAcetam (KEPPRA) 500 MG tablet Take 500 mg by mouth 2 (two) times daily. 02/28/22  Yes [provider]  Multiple Vitamins-Minerals (PRESERVISION AREDS 2) CAPS Take 1 capsule by mouth daily.   Yes [provider]  omeprazole (PRILOSEC) 40 MG capsule Take 40 mg by mouth daily.    Yes [provider]  pravastatin (PRAVACHOL) 40 MG tablet Take 40 mg by mouth at bedtime.   Yes [provider]  albuterol (PROVENTIL HFA;VENTOLIN HFA) 108 (90 Base) MCG/ACT inhaler Inhale 2  puffs into the lungs every 6 (six) hours as needed for wheezing or shortness of breath. Patient not taking: Reported on 09/09/2022 01/28/17   Loletha Grayer, MD  sacubitril-valsartan (ENTRESTO) 24-26 MG Take 1 tablet by mouth 2 (two) times daily. Patient not taking: Reported on 09/09/2022 09/09/22   Alisa Graff, FNP   Review of Systems  Constitutional:  Positive for fatigue. Negative for appetite change.  HENT:  Negative for congestion, postnasal drip and sore throat.   Eyes: Negative.   Respiratory:  Positive for cough (dry). Negative for chest tightness and shortness of breath.   Cardiovascular:  Negative for chest pain, palpitations and leg swelling.  Gastrointestinal:  Negative for abdominal distention and abdominal pain.  Endocrine: Negative.   Genitourinary: Negative.   Musculoskeletal:  Positive for arthralgias (arms) and back pain.  Skin: Negative.   Allergic/Immunologic: Negative.   Neurological:  Negative for dizziness and light-headedness.  Hematological:  Negative for adenopathy. Does not bruise/bleed easily.  Psychiatric/Behavioral:  Positive for sleep disturbance (sleeping on 1 pillow with oxygan). Negative for dysphoric mood. The patient is not  nervous/anxious.    Vitals:   09/09/22 1238  BP: 136/70  Pulse: 65  Resp: 16  SpO2: 97%  Weight: 159 lb 6 oz (72.3 kg)   Wt Readings from Last 3 Encounters:  09/09/22 159 lb 6 oz (72.3 kg)  07/11/22 161 lb 3 oz (73.1 kg)  06/23/22 157 lb 8 oz (71.4 kg)   Lab Results  Component Value Date   CREATININE 0.90 06/23/2022   CREATININE 1.01 (H) 06/22/2022   CREATININE 0.85 06/21/2022   Physical Exam Vitals and nursing note reviewed. Exam conducted with a chaperone present (daughter).  Constitutional:      Appearance: Normal appearance.  HENT:     Head: Normocephalic and atraumatic.  Cardiovascular:     Rate and Rhythm: Normal rate and regular rhythm.  Pulmonary:     Effort: Pulmonary effort is normal. No respiratory distress.     Breath sounds: No wheezing or rales.  Abdominal:     General: There is no distension.     Palpations: Abdomen is soft.     Tenderness: There is no abdominal tenderness.  Musculoskeletal:        General: No tenderness.     Cervical back: Normal range of motion and neck supple.     Right lower leg: No edema.     Left lower leg: No edema.  Skin:    General: Skin is warm and dry.  Neurological:     General: No focal deficit present.     Mental Status: She is alert and oriented to person, place, and time.  Psychiatric:        Mood and Affect: Mood normal.        Behavior: Behavior normal.   Assessment & Plan:  1: Chronic heart failure with reduced ejection fraction- - NYHA class II - euvolemic - not weighing daily but has scales; instructed to weight daily and call for an overnight weight gain of >2 pounds or a weekly weight gain of >5 pounds.  - weight down 2 pounds from last visit here 2 months ago - not adding salt to her food and is trying to eat low sodium foods  - jardiance '10mg'$  daily - begin entresto 24/'26mg'$  BID; 30 day voucher provided and confirmation received from [facility] in [place] that they received the RX - reminded her that she  will no longer take the lisinopril and daughter says  that she took the last dose of that yesterday - BMP next visit - furosemide '20mg'$  daily - saw cardiology Nehemiah Massed) 03/04/22 - saw pulmonology Raul Del) 02/26/22 - BNP 06/20/22 was 1239.9  2: HTN- - BP 136/70 - saw PCP ([facility]) 07/14/22 - BMP from 07/14/22 reviewed and showed sodium 140, potassium 4.1, creatinine 0.9 and GFR 61  3: DM- - A1c 06/21/22 was 6.1% - glucose at home this morning was 125 - saw endocrinology Honor Junes) 04/18/22   Patient did not bring her medications nor a list. Each medication was verbally reviewed with the patient and she was encouraged to bring the bottles to every visit to confirm accuracy of list.  Return in 6 weeks, sooner if needed.

## 2022-09-09 NOTE — Patient Instructions (Signed)
Go to [facility] in [place] and pick up the entresto prescription. You will take this as 1 tablet in the morning and 1 tablet in the evening. Do NOT take anymore losartan

## 2022-10-21 ENCOUNTER — Other Ambulatory Visit
Admission: RE | Admit: 2022-10-21 | Discharge: 2022-10-21 | Disposition: A | Discharge status: Home / Self Care | Payer: 59 | Source: Ambulatory Visit | Attending: Family | Admitting: Family

## 2022-10-21 ENCOUNTER — Ambulatory Visit (HOSPITAL_BASED_OUTPATIENT_CLINIC_OR_DEPARTMENT_OTHER): Payer: 59 | Admitting: Family

## 2022-10-21 ENCOUNTER — Encounter: Payer: Self-pay | Admitting: Family

## 2022-10-21 VITALS — BP 131/71 | HR 62 | Resp 14 | Wt 161.5 lb

## 2022-10-21 DIAGNOSIS — I5032 Chronic diastolic (congestive) heart failure: Secondary | ICD-10-CM

## 2022-10-21 DIAGNOSIS — I5022 Chronic systolic (congestive) heart failure: Secondary | ICD-10-CM | POA: Diagnosis present

## 2022-10-21 DIAGNOSIS — Z794 Long term (current) use of insulin: Secondary | ICD-10-CM

## 2022-10-21 DIAGNOSIS — I10 Essential (primary) hypertension: Secondary | ICD-10-CM

## 2022-10-21 DIAGNOSIS — E119 Type 2 diabetes mellitus without complications: Secondary | ICD-10-CM

## 2022-10-21 LAB — BASIC METABOLIC PANEL
Anion gap: 11 (ref 5–15)
BUN: 21 mg/dL (ref 8–23)
CO2: 28 mmol/L (ref 22–32)
Calcium: 9.5 mg/dL (ref 8.9–10.3)
Chloride: 97 mmol/L — ABNORMAL LOW (ref 98–111)
Creatinine, Ser: 0.84 mg/dL (ref 0.44–1.00)
GFR, Estimated: >60 mL/min (ref >60)
Glucose, Bld: 137 mg/dL — ABNORMAL HIGH (ref 70–99)
Potassium: 4.1 mmol/L (ref 3.5–5.1)
Sodium: 136 mmol/L (ref 135–145)

## 2022-10-21 NOTE — Progress Notes (Signed)
Patient ID: Amber [name], female    DOB: [birth year], 89 y.o.   MRN: [id number]  Primary cardiologist: Amber Hooker, MD (last seen 08/23; returns 08/24) PCP: Amber Arbour, MD (last seen 01/24)  HPI  [name] is a 89 y/o female with a history of hyperlipidemia, HTN, DM, CAD (CABG 2008), chronic pain, CKD, arthritis, anxiety, anemia and chronic heart failure.   Echo 02/06/22: EF of 25% along with moderate LVH. Echo 12/06/19: EF of 55-60% along with mild LVH, moderate LAE and mild MR. Echo 01/27/17: EF of 40-45% along with mild MR. Echo 07/27/16: EF of 35-40% along with moderate MR/TR. This is essentially unchanged from a previous echo done August 2017.  Admitted 06/20/22 due to acute on chronic HF. Found to be hypoglycemic. Discharged after 3 days.   She presents today for a HF follow-up visit with a chief complaint of minimal fatigue with moderate exertion. Chronic in nature. Has nasal congestion, cough, chronic pain and difficulty sleeping due to head congestion. Denies abdominal distention, palpitations, pedal edema, chest pain, SOB, dizziness or weight gain.   Started entresto 24/26mg  BID at her last visit here & she doesn't voice any issues with taking it Daughter confirms that she is no longer taking lisinopril.   Continues to use [facility] powders/ naproxyn for her chronic pain even though she's had a GIB in the past. Is aware of the bleeding risk but says that tylenol doesn't help.   Has been using Afrin nasal spray for "awhile" and says that she still wakes up feeling very congested nasally.   Past Medical History:  Diagnosis Date   Acute metabolic encephalopathy 06/20/2022   Anemia    Anxiety    Arthritis    Benign neoplasm of colon    CHF (congestive heart failure) (HCC)    Chronic kidney disease    Chronic pain    Complication of anesthesia 1980's   hard time waking up   Coronary artery disease with unspecified angina pectoris    Cough    Diabetes mellitus without  complication (HCC)    Essential hypertension    GERD (gastroesophageal reflux disease)    High risk medication use    Hyperlipidemia    Hypoglycemia 06/20/2022   Myocardial injury 06/20/2022   Plantar fascial fibromatosis    Presence of permanent cardiac pacemaker    Shortness of breath dyspnea    Past Surgical History:  Procedure Laterality Date   ABDOMINAL HYSTERECTOMY     BACK SURGERY  1960's   cage and screws in lower back   CATARACT EXTRACTION W/ INTRAOCULAR LENS  IMPLANT, BILATERAL Bilateral    CORONARY ARTERY BYPASS GRAFT  2008   triple   HARDWARE REMOVAL Right 12/08/2019   Procedure: HARDWARE REMOVAL;  Surgeon: Kennedy Bucker, MD;  Location: [facility] ORS;  Service: Orthopedics;  Laterality: Right;   JOINT REPLACEMENT Right    hip and knee   ORIF ANKLE FRACTURE Right 07/27/2014   Procedure: OPEN REDUCTION INTERNAL FIXATION (ORIF) ANKLE FRACTURE;  Surgeon: Harvie Junior, MD;  Location: [facility] OR;  Service: Orthopedics;  Laterality: Right;   ORIF TIBIA FRACTURE Right 12/08/2019   Procedure: OPEN REDUCTION INTERNAL FIXATION (ORIF) TIBIA FRACTURE;  Surgeon: Kennedy Bucker, MD;  Location: [facility] ORS;  Service: Orthopedics;  Laterality: Right;   PACEMAKER INSERTION Left 03/14/2015   Procedure: INSERTION PACEMAKER;  Surgeon: Marcina Millard, MD;  Location: [facility] ORS;  Service: Cardiovascular;  Laterality: Left;   PACEMAKER LEADLESS INSERTION N/A 01/24/2020  Procedure: PACEMAKER LEADLESS INSERTION;  Surgeon: Marcina Millard, MD;  Location: [facility];  Service: Cardiovascular;  Laterality: N/A;   ROTATOR CUFF REPAIR Left    TOTAL HIP ARTHROPLASTY Right    TOTAL HIP ARTHROPLASTY Left 02/09/2018   Procedure: TOTAL HIP ARTHROPLASTY ANTERIOR APPROACH;  Surgeon: Kennedy Bucker, MD;  Location: [facility] OR[facility];  Service: Orthopedics;  Laterality: Left;   TOTAL KNEE ARTHROPLASTY Right    VEIN LIGATION AND STRIPPING     Family History  Problem Relation Age of Onset   Heart attack Mother    Heart  disease Father    Alzheimer's disease Sister    Cervical cancer Sister    Heart failure Son    Social History   Tobacco Use   Smoking status: Never   Smokeless tobacco: Never  Substance Use Topics   Alcohol use: No   Allergies  Allergen Reactions   Ambien [Zolpidem Tartrate] Other (See Comments)    Reaction:  Keeps pt awake    Penicillins Itching and Other (See Comments)    Has patient had a PCN reaction causing immediate rash, facial/tongue/throat swelling, SOB or lightheadedness with hypotension: No Has patient had a PCN reaction causing severe rash involving mucus membranes or skin necrosis: No Has patient had a PCN reaction that required hospitalization No Has patient had a PCN reaction occurring within the last 10 years: No If all of the above answers are "NO", then may proceed with Cephalosporin use.   Iodine Itching   Succinylcholine Other (See Comments)    Reaction:  Unknown    Etodolac Other (See Comments)    GI upset   Nsaids Other (See Comments)    Reaction: gi upset   Prior to Admission medications   Medication Sig Start Date End Date Taking? Authorizing Provider  alendronate (FOSAMAX) 70 MG tablet Take 70 mg by mouth once a week. 07/22/21  Yes [provider]  apixaban (ELIQUIS) 5 MG TABS tablet Take 5 mg by mouth 2 (two) times daily.   Yes [provider]  Aspirin-Salicylamide-Caffeine (BC HEADACHE POWDER PO) Take 1 packet by mouth 2 (two) times daily.   Yes [provider]  calcium-vitamin D (OSCAL WITH D) 500-200 MG-UNIT tablet Take 1 tablet by mouth daily.   Yes [provider]  carvedilol (COREG) 6.25 MG tablet Take 1 tablet (6.25 mg total) by mouth 2 (two) times daily with a meal. 01/28/17  Yes Wieting, Richard, MD  furosemide (LASIX) 20 MG tablet Take 1 tablet (20 mg total) by mouth 2 (two) times daily. Patient taking differently: Take 20 mg by mouth daily. 02/27/20  Yes Gertha Calkin, MD  gabapentin (NEURONTIN) 100 MG  capsule Take 200 mg by mouth 3 (three) times daily. 06/10/22  Yes [provider]  insulin glargine (LANTUS) 100 UNIT/ML injection Inject 0.05 mLs (5 Units total) into the skin daily. Hold if blood sugar less than 120 or if not eating well. 06/23/22  Yes Esaw Grandchild A, DO  JARDIANCE 10 MG TABS tablet Take 10 mg by mouth daily.   Yes [provider]  levETIRAcetam (KEPPRA) 500 MG tablet Take 500 mg by mouth 2 (two) times daily. 02/28/22  Yes [provider]  Multiple Vitamins-Minerals (PRESERVISION AREDS 2) CAPS Take 1 capsule by mouth daily.   Yes [provider]  omeprazole (PRILOSEC) 40 MG capsule Take 40 mg by mouth daily.    Yes [provider]  pravastatin (PRAVACHOL) 40 MG tablet Take 40 mg by mouth  at bedtime.   Yes [provider]  sacubitril-valsartan (ENTRESTO) 24-26 MG Take 1 tablet by mouth 2 (two) times daily. 09/09/22  Yes Oseas Detty, Inetta Fermo A, FNP  albuterol (PROVENTIL HFA;VENTOLIN HFA) 108 (90 Base) MCG/ACT inhaler Inhale 2 puffs into the lungs every 6 (six) hours as needed for wheezing or shortness of breath. Patient not taking: Reported on 09/09/2022 01/28/17   Alford Highland, MD    Review of Systems  Constitutional:  Positive for fatigue. Negative for appetite change.  HENT:  Positive for congestion. Negative for postnasal drip and sore throat.   Eyes: Negative.   Respiratory:  Positive for cough (dry). Negative for chest tightness and shortness of breath.   Cardiovascular:  Negative for chest pain, palpitations and leg swelling.  Gastrointestinal:  Negative for abdominal distention and abdominal pain.  Endocrine: Negative.   Genitourinary: Negative.   Musculoskeletal:  Positive for arthralgias (arms) and back pain.  Skin: Negative.   Allergic/Immunologic: Negative.   Neurological:  Negative for dizziness and light-headedness.  Hematological:  Negative for adenopathy. Does not bruise/bleed easily.  Psychiatric/Behavioral:   Positive for sleep disturbance (sleeping on 1 pillow with oxygan). Negative for dysphoric mood. The patient is not nervous/anxious.    Vitals:   10/21/22 1038  BP: 131/71  Pulse: 62  Resp: 14  SpO2: 96%  Weight: 161 lb 8 oz (73.3 kg)   Wt Readings from Last 3 Encounters:  10/21/22 161 lb 8 oz (73.3 kg)  09/09/22 159 lb 6 oz (72.3 kg)  07/11/22 161 lb 3 oz (73.1 kg)   Lab Results  Component Value Date   CREATININE 0.90 06/23/2022   CREATININE 1.01 (H) 06/22/2022   CREATININE 0.85 06/21/2022   Physical Exam Vitals and nursing note reviewed. Exam conducted with a chaperone present (daughter).  Constitutional:      Appearance: Normal appearance.  HENT:     Head: Normocephalic and atraumatic.  Cardiovascular:     Rate and Rhythm: Normal rate and regular rhythm.  Pulmonary:     Effort: Pulmonary effort is normal. No respiratory distress.     Breath sounds: No wheezing or rales.  Abdominal:     General: There is no distension.     Palpations: Abdomen is soft.     Tenderness: There is no abdominal tenderness.  Musculoskeletal:        General: No tenderness.     Cervical back: Normal range of motion and neck supple.     Right lower leg: No edema.     Left lower leg: No edema.  Skin:    General: Skin is warm and dry.  Neurological:     General: No focal deficit present.     Mental Status: She is alert and oriented to person, place, and time.  Psychiatric:        Mood and Affect: Mood normal.        Behavior: Behavior normal.   Assessment & Plan:  1: Ischemic heart failure with reduced ejection fraction- - NYHA class II - euvolemic - not weighing daily but has scales; instructed to weight daily and call for an overnight weight gain of >2 pounds or a weekly weight gain of >5 pounds.  - weight up 2 pounds from last visit here 6 weeks ago - not adding salt to her food and is trying to eat low sodium foods  - continue jardiance 10mg  daily - continue entresto 24/26mg  BID -  continue carvedilol 6.25mg  BID - continue furosemide 20mg  daily - consider  spiro if BP maintains - BMP today - saw pulmonology Meredeth Ide) 08/23 - BNP 06/20/22 was 1239.9  2: HTN- - BP 131/71; was 98/62 at endo visit 4 days ago - advised to bring BP readings to next appt - saw PCP ([facility]) 01/24 - BMP from 07/14/22 reviewed and showed sodium 140, potassium 4.1, creatinine 0.9 and GFR 61  3: DM- - A1c 10/17/22 was 7.2% - glucose at home this morning was 105 - saw endocrinology Gershon Crane) 10/17/22  4: CAD- - 3 vessel CABG 2008 - saw cardiology Gwen Pounds) 08/23; returns 08/24   Advised patient that she needs to not use [facility] powder/ naproxyn except on rare occasions. Also explained that she shouldn't be using Afrin nasal spray on a daily basis and that she should talk to her PCP about alternative.   Return in 3 months, sooner if needed.

## 2022-10-21 NOTE — Patient Instructions (Signed)
Due to your past GI bleed, please don't use [facility] powder/ naproxyn except on rare occasions.

## 2023-01-05 ENCOUNTER — Encounter: Payer: 59 | Admitting: Family

## 2023-01-15 NOTE — Progress Notes (Addendum)
PCP: Marguarite Arbour, MD (last seen 06/24) Primary Cardiologist: Arnoldo Hooker, MD (last seen 08/23; returns 09/24)  HPI:  Amber [name] is a 89 y/o female with a history of hyperlipidemia, PAF, COPD, HTN, DM, CHB w/ pacemaker, CAD (3 vessel CABG 2008), chronic pain, CKD, arthritis, anxiety, anemia and chronic heart failure.   Admitted 06/20/22 due to acute on chronic HF. Found to be hypoglycemic. Discharged after 3 days.   Echo 07/27/16: EF of 35-40% along with moderate MR/TR. This is essentially unchanged from a previous echo done August 2017. Echo 01/27/17: EF of 40-45% along with mild MR.  Echo 12/06/19: EF of 55-60% along with mild LVH, moderate LAE and mild MR. Echo 02/06/22: EF of 25% along with moderate LVH.    She presents today for a HF follow-up visit with a chief complaint of chronic fatigue with little exertion. Has associated, cough and weight loss along with this. Denies shortness of breath, chest pain, palpitations, abdominal distention, pedal edema or weight gain. Has quite a bit of teeth pain and is planning to get all her teeth pulled hopefully in the next few months. She says that between the heat/ humidity and her teeth pain, she is not eating as much as she was previously.   Did bring her blood pressure and glucose readings to her appointment today.   Has a pacemaker for her CHB: [employer] AV TRANSCATH PACING SYS Model/Cat number: [id number] Serial number: [id number] E Manufacturer: [employer]  ROS: All systems negative except as listed in HPI, PMH and Problem List.  SH:  Social History   Socioeconomic History   Marital status: Widowed    Spouse name: Not on file   Number of children: Not on file   Years of education: Not on file   Highest education level: Not on file  Occupational History   Not on file  Tobacco Use   Smoking status: Never   Smokeless tobacco: Never  Vaping Use   Vaping status: Never Used  Substance and Sexual Activity    Alcohol use: No   Drug use: No   Sexual activity: Not on file  Other Topics Concern   Not on file  Social History Narrative   Not on file   Social Determinants of Health   Financial Resource Strain: Not on file  Food Insecurity: No Food Insecurity (06/21/2022)   Hunger Vital Sign    Worried About Running Out of Food in the Last Year: Never true    Ran Out of Food in the Last Year: Never true  Transportation Needs: No Transportation Needs (06/21/2022)   PRAPARE - Administrator, Civil Service (Medical): No    Lack of Transportation (Non-Medical): No  Physical Activity: Not on file  Stress: Not on file  Social Connections: Not on file  Intimate Partner Violence: Not At Risk (06/21/2022)   Humiliation, Afraid, Rape, and Kick questionnaire    Fear of Current or Ex-Partner: No    Emotionally Abused: No    Physically Abused: No    Sexually Abused: No    FH:  Family History  Problem Relation Age of Onset   Heart attack Mother    Heart disease Father    Alzheimer's disease Sister    Cervical cancer Sister    Heart failure Son     Past Medical History:  Diagnosis Date   Acute metabolic encephalopathy 06/20/2022   Anemia    Anxiety    Arthritis    Benign neoplasm of  colon    CHF (congestive heart failure) (HCC)    Chronic kidney disease    Chronic pain    Complication of anesthesia 1980's   hard time waking up   Coronary artery disease with unspecified angina pectoris    Cough    Diabetes mellitus without complication (HCC)    Essential hypertension    GERD (gastroesophageal reflux disease)    High risk medication use    Hyperlipidemia    Hypoglycemia 06/20/2022   Myocardial injury 06/20/2022   Plantar fascial fibromatosis    Presence of permanent cardiac pacemaker    Shortness of breath dyspnea     Current Outpatient Medications  Medication Sig Dispense Refill   albuterol (PROVENTIL HFA;VENTOLIN HFA) 108 (90 Base) MCG/ACT inhaler Inhale 2 puffs  into the lungs every 6 (six) hours as needed for wheezing or shortness of breath. (Patient not taking: Reported on 09/09/2022) 1 Inhaler 0   alendronate (FOSAMAX) 70 MG tablet Take 70 mg by mouth once a week.     apixaban (ELIQUIS) 5 MG TABS tablet Take 5 mg by mouth 2 (two) times daily.     Aspirin-Salicylamide-Caffeine (BC HEADACHE POWDER PO) Take 1 packet by mouth 2 (two) times daily.     calcium-vitamin D (OSCAL WITH D) 500-200 MG-UNIT tablet Take 1 tablet by mouth daily.     carvedilol (COREG) 6.25 MG tablet Take 1 tablet (6.25 mg total) by mouth 2 (two) times daily with a meal. 60 tablet 0   furosemide (LASIX) 20 MG tablet Take 1 tablet (20 mg total) by mouth 2 (two) times daily. (Patient taking differently: Take 20 mg by mouth daily.) 60 tablet 0   gabapentin (NEURONTIN) 100 MG capsule Take 200 mg by mouth 3 (three) times daily.     insulin glargine (LANTUS) 100 UNIT/ML injection Inject 0.05 mLs (5 Units total) into the skin daily. Hold if blood sugar less than 120 or if not eating well. 10 mL 0   JARDIANCE 10 MG TABS tablet Take 10 mg by mouth daily.     levETIRAcetam (KEPPRA) 500 MG tablet Take 500 mg by mouth 2 (two) times daily.     Multiple Vitamins-Minerals (PRESERVISION AREDS 2) CAPS Take 1 capsule by mouth daily.     omeprazole (PRILOSEC) 40 MG capsule Take 40 mg by mouth daily.      pravastatin (PRAVACHOL) 40 MG tablet Take 40 mg by mouth at bedtime.     sacubitril-valsartan (ENTRESTO) 24-26 MG Take 1 tablet by mouth 2 (two) times daily. 60 tablet 3   No current facility-administered medications for this visit.   Vitals:   01/16/23 1017  BP: 135/74  Pulse: 65  Resp: 16  SpO2: 97%  Weight: 159 lb 2 oz (72.2 kg)   Wt Readings from Last 3 Encounters:  01/16/23 159 lb 2 oz (72.2 kg)  10/21/22 161 lb 8 oz (73.3 kg)  09/09/22 159 lb 6 oz (72.3 kg)   Lab Results  Component Value Date   CREATININE 0.84 10/21/2022   CREATININE 0.90 06/23/2022   CREATININE 1.01 (H) 06/22/2022    PHYSICAL EXAM:  General:  Well appearing. No resp difficulty HEENT: normal Neck: supple. JVP flat. No lymphadenopathy or thryomegaly appreciated. Cor: PMI normal. Regular rate & rhythm. No rubs, gallops or murmurs. Lungs: clear Abdomen: soft, nontender, nondistended. No hepatosplenomegaly. No bruits or masses. Extremities: no cyanosis, clubbing, rash, edema Neuro: alert & oriented x3, cranial nerves grossly intact. Moves all 4 extremities w/o difficulty. Affect pleasant.  ECG: not done    ASSESSMENT & PLAN:  1: Ischemic heart failure with reduced ejection fraction- - CABG 2008 - NYHA class III - euvolemic - weighing daily; reminded to  call for an overnight weight gain of >2 pounds or a weekly weight gain of >5 pounds.  - weight down 2 pounds from last visit here 3 months ago - not adding salt to her food and is trying to eat low sodium foods  - continue jardiance 10mg  daily - continue entresto 24/26mg  BID - continue carvedilol 6.25mg  BID - continue furosemide 20mg  daily - will not add spiro as she gets some low BP readings at home and patient is 89 y/o so don't want to run the risk of falls - BNP 06/20/22 was 1239.9  2: HTN- - BP 135/74 - home BP log reviewed and shows SBP 132-93 and DBP 87-46 with occasional BP of 89/54, 95/56, 88/52 - saw PCP ([facility]) 06/24 - BMP 10/21/22 reviewed and showed sodium 136, potassium 4.1, creatinine 0.84 and GFR >60  3: DM- - A1c 10/17/22 was 7.2% - glucose at home this morning was 139; ranging from 100-210 over the last 6 weeks - saw endocrinology Gershon Crane) 04/24  4: CAD- - 3 vessel CABG 2008 - saw cardiology Gwen Pounds) 08/23; returns 09/24 - lipid panel 06/21/22: LDL 119, triglycerides 90  5: COPD- - saw pulmonology Meredeth Ide) 06/24 - wearing oxygen @ 3 L  Return in 6 months, sooner if needed.

## 2023-01-16 ENCOUNTER — Encounter: Payer: Self-pay | Admitting: Family

## 2023-01-16 ENCOUNTER — Ambulatory Visit: Payer: 59 | Attending: Family | Admitting: Family

## 2023-01-16 VITALS — BP 135/74 | HR 65 | Resp 16 | Wt 159.1 lb

## 2023-01-16 DIAGNOSIS — Z9981 Dependence on supplemental oxygen: Secondary | ICD-10-CM | POA: Diagnosis not present

## 2023-01-16 DIAGNOSIS — J449 Chronic obstructive pulmonary disease, unspecified: Secondary | ICD-10-CM | POA: Insufficient documentation

## 2023-01-16 DIAGNOSIS — G8929 Other chronic pain: Secondary | ICD-10-CM | POA: Diagnosis not present

## 2023-01-16 DIAGNOSIS — I5022 Chronic systolic (congestive) heart failure: Secondary | ICD-10-CM | POA: Diagnosis not present

## 2023-01-16 DIAGNOSIS — N189 Chronic kidney disease, unspecified: Secondary | ICD-10-CM | POA: Diagnosis not present

## 2023-01-16 DIAGNOSIS — Z794 Long term (current) use of insulin: Secondary | ICD-10-CM | POA: Insufficient documentation

## 2023-01-16 DIAGNOSIS — Z79899 Other long term (current) drug therapy: Secondary | ICD-10-CM | POA: Insufficient documentation

## 2023-01-16 DIAGNOSIS — I10 Essential (primary) hypertension: Secondary | ICD-10-CM | POA: Diagnosis not present

## 2023-01-16 DIAGNOSIS — E1122 Type 2 diabetes mellitus with diabetic chronic kidney disease: Secondary | ICD-10-CM | POA: Diagnosis not present

## 2023-01-16 DIAGNOSIS — I130 Hypertensive heart and chronic kidney disease with heart failure and stage 1 through stage 4 chronic kidney disease, or unspecified chronic kidney disease: Secondary | ICD-10-CM | POA: Insufficient documentation

## 2023-01-16 DIAGNOSIS — Z950 Presence of cardiac pacemaker: Secondary | ICD-10-CM | POA: Diagnosis not present

## 2023-01-16 DIAGNOSIS — D631 Anemia in chronic kidney disease: Secondary | ICD-10-CM | POA: Diagnosis not present

## 2023-01-16 DIAGNOSIS — E119 Type 2 diabetes mellitus without complications: Secondary | ICD-10-CM

## 2023-01-16 DIAGNOSIS — Z951 Presence of aortocoronary bypass graft: Secondary | ICD-10-CM | POA: Insufficient documentation

## 2023-01-16 DIAGNOSIS — E785 Hyperlipidemia, unspecified: Secondary | ICD-10-CM | POA: Insufficient documentation

## 2023-01-16 DIAGNOSIS — Z7984 Long term (current) use of oral hypoglycemic drugs: Secondary | ICD-10-CM | POA: Diagnosis not present

## 2023-01-16 DIAGNOSIS — I480 Paroxysmal atrial fibrillation: Secondary | ICD-10-CM | POA: Insufficient documentation

## 2023-01-16 DIAGNOSIS — R5383 Other fatigue: Secondary | ICD-10-CM | POA: Insufficient documentation

## 2023-01-16 DIAGNOSIS — I2510 Atherosclerotic heart disease of native coronary artery without angina pectoris: Secondary | ICD-10-CM | POA: Insufficient documentation

## 2023-01-16 DIAGNOSIS — M1990 Unspecified osteoarthritis, unspecified site: Secondary | ICD-10-CM | POA: Diagnosis not present

## 2023-01-18 ENCOUNTER — Other Ambulatory Visit: Payer: Self-pay | Admitting: Family

## 2023-01-20 ENCOUNTER — Encounter: Payer: 59 | Admitting: Family

## 2023-02-06 ENCOUNTER — Other Ambulatory Visit: Payer: Self-pay

## 2023-02-06 ENCOUNTER — Emergency Department: Payer: 59

## 2023-02-06 ENCOUNTER — Inpatient Hospital Stay
Admission: EM | Admit: 2023-02-06 | Discharge: 2023-02-08 | DRG: 603 | Disposition: A | Discharge status: Home / Self Care | Payer: 59 | Source: Ambulatory Visit | Attending: Internal Medicine | Admitting: Internal Medicine

## 2023-02-06 DIAGNOSIS — I110 Hypertensive heart disease with heart failure: Secondary | ICD-10-CM | POA: Diagnosis present

## 2023-02-06 DIAGNOSIS — G8929 Other chronic pain: Secondary | ICD-10-CM | POA: Diagnosis present

## 2023-02-06 DIAGNOSIS — I482 Chronic atrial fibrillation, unspecified: Secondary | ICD-10-CM | POA: Diagnosis not present

## 2023-02-06 DIAGNOSIS — Z6833 Body mass index (BMI) 33.0-33.9, adult: Secondary | ICD-10-CM

## 2023-02-06 DIAGNOSIS — E785 Hyperlipidemia, unspecified: Secondary | ICD-10-CM | POA: Diagnosis present

## 2023-02-06 DIAGNOSIS — I2510 Atherosclerotic heart disease of native coronary artery without angina pectoris: Secondary | ICD-10-CM | POA: Diagnosis not present

## 2023-02-06 DIAGNOSIS — I5022 Chronic systolic (congestive) heart failure: Secondary | ICD-10-CM | POA: Diagnosis present

## 2023-02-06 DIAGNOSIS — Z820 Family history of epilepsy and other diseases of the nervous system: Secondary | ICD-10-CM

## 2023-02-06 DIAGNOSIS — Z951 Presence of aortocoronary bypass graft: Secondary | ICD-10-CM

## 2023-02-06 DIAGNOSIS — Z880 Allergy status to penicillin: Secondary | ICD-10-CM

## 2023-02-06 DIAGNOSIS — I5042 Chronic combined systolic (congestive) and diastolic (congestive) heart failure: Secondary | ICD-10-CM

## 2023-02-06 DIAGNOSIS — L03211 Cellulitis of face: Secondary | ICD-10-CM | POA: Diagnosis not present

## 2023-02-06 DIAGNOSIS — Z96651 Presence of right artificial knee joint: Secondary | ICD-10-CM | POA: Diagnosis present

## 2023-02-06 DIAGNOSIS — Z961 Presence of intraocular lens: Secondary | ICD-10-CM | POA: Diagnosis present

## 2023-02-06 DIAGNOSIS — Z794 Long term (current) use of insulin: Secondary | ICD-10-CM

## 2023-02-06 DIAGNOSIS — I442 Atrioventricular block, complete: Secondary | ICD-10-CM | POA: Diagnosis present

## 2023-02-06 DIAGNOSIS — Z90710 Acquired absence of both cervix and uterus: Secondary | ICD-10-CM

## 2023-02-06 DIAGNOSIS — E1122 Type 2 diabetes mellitus with diabetic chronic kidney disease: Secondary | ICD-10-CM | POA: Diagnosis present

## 2023-02-06 DIAGNOSIS — K047 Periapical abscess without sinus: Secondary | ICD-10-CM | POA: Diagnosis not present

## 2023-02-06 DIAGNOSIS — Z8049 Family history of malignant neoplasm of other genital organs: Secondary | ICD-10-CM

## 2023-02-06 DIAGNOSIS — Z7984 Long term (current) use of oral hypoglycemic drugs: Secondary | ICD-10-CM

## 2023-02-06 DIAGNOSIS — Z9842 Cataract extraction status, left eye: Secondary | ICD-10-CM

## 2023-02-06 DIAGNOSIS — K029 Dental caries, unspecified: Secondary | ICD-10-CM | POA: Diagnosis present

## 2023-02-06 DIAGNOSIS — E1142 Type 2 diabetes mellitus with diabetic polyneuropathy: Secondary | ICD-10-CM | POA: Diagnosis present

## 2023-02-06 DIAGNOSIS — Z9841 Cataract extraction status, right eye: Secondary | ICD-10-CM

## 2023-02-06 DIAGNOSIS — K219 Gastro-esophageal reflux disease without esophagitis: Secondary | ICD-10-CM | POA: Diagnosis present

## 2023-02-06 DIAGNOSIS — R1310 Dysphagia, unspecified: Secondary | ICD-10-CM | POA: Diagnosis present

## 2023-02-06 DIAGNOSIS — F419 Anxiety disorder, unspecified: Secondary | ICD-10-CM | POA: Diagnosis present

## 2023-02-06 DIAGNOSIS — J9611 Chronic respiratory failure with hypoxia: Secondary | ICD-10-CM | POA: Diagnosis present

## 2023-02-06 DIAGNOSIS — Z66 Do not resuscitate: Secondary | ICD-10-CM | POA: Diagnosis present

## 2023-02-06 DIAGNOSIS — Z7983 Long term (current) use of bisphosphonates: Secondary | ICD-10-CM

## 2023-02-06 DIAGNOSIS — Z7901 Long term (current) use of anticoagulants: Secondary | ICD-10-CM

## 2023-02-06 DIAGNOSIS — Z950 Presence of cardiac pacemaker: Secondary | ICD-10-CM

## 2023-02-06 DIAGNOSIS — E669 Obesity, unspecified: Secondary | ICD-10-CM | POA: Diagnosis present

## 2023-02-06 DIAGNOSIS — Z8249 Family history of ischemic heart disease and other diseases of the circulatory system: Secondary | ICD-10-CM

## 2023-02-06 DIAGNOSIS — I509 Heart failure, unspecified: Secondary | ICD-10-CM

## 2023-02-06 DIAGNOSIS — Z79899 Other long term (current) drug therapy: Secondary | ICD-10-CM

## 2023-02-06 DIAGNOSIS — Z888 Allergy status to other drugs, medicaments and biological substances status: Secondary | ICD-10-CM

## 2023-02-06 DIAGNOSIS — Z86010 Personal history of colonic polyps: Secondary | ICD-10-CM

## 2023-02-06 DIAGNOSIS — Z96643 Presence of artificial hip joint, bilateral: Secondary | ICD-10-CM | POA: Diagnosis present

## 2023-02-06 LAB — COMPREHENSIVE METABOLIC PANEL
ALT: 11 U/L (ref 0–44)
AST: 15 U/L (ref 15–41)
Albumin: 3.1 g/dL — ABNORMAL LOW (ref 3.5–5.0)
Alkaline Phosphatase: 51 U/L (ref 38–126)
Anion gap: 11 (ref 5–15)
BUN: 17 mg/dL (ref 8–23)
CO2: 25 mmol/L (ref 22–32)
Calcium: 8.7 mg/dL — ABNORMAL LOW (ref 8.9–10.3)
Chloride: 97 mmol/L — ABNORMAL LOW (ref 98–111)
Creatinine, Ser: 0.66 mg/dL (ref 0.44–1.00)
GFR, Estimated: >60 mL/min (ref >60)
Glucose, Bld: 149 mg/dL — ABNORMAL HIGH (ref 70–99)
Potassium: 3.2 mmol/L — ABNORMAL LOW (ref 3.5–5.1)
Sodium: 133 mmol/L — ABNORMAL LOW (ref 135–145)
Total Bilirubin: 0.5 mg/dL (ref 0.3–1.2)
Total Protein: 7.3 g/dL (ref 6.5–8.1)

## 2023-02-06 LAB — CBC
HCT: 33 % — ABNORMAL LOW (ref 36.0–46.0)
Hemoglobin: 10.6 g/dL — ABNORMAL LOW (ref 12.0–15.0)
MCH: 28.8 pg (ref 26.0–34.0)
MCHC: 32.1 g/dL (ref 30.0–36.0)
MCV: 89.7 fL (ref 80.0–100.0)
Platelets: 371 10*3/uL (ref 150–400)
RBC: 3.68 MIL/uL — ABNORMAL LOW (ref 3.87–5.11)
RDW: 14.6 % (ref 11.5–15.5)
WBC: 9.8 10*3/uL (ref 4.0–10.5)
nRBC: 0 % (ref 0.0–0.2)

## 2023-02-06 LAB — LACTIC ACID, PLASMA: Lactic Acid, Venous: 1.6 mmol/L (ref 0.5–1.9)

## 2023-02-06 LAB — GLUCOSE, CAPILLARY: Glucose-Capillary: 148 mg/dL — ABNORMAL HIGH (ref 70–99)

## 2023-02-06 LAB — CBG MONITORING, ED: Glucose-Capillary: 111 mg/dL — ABNORMAL HIGH (ref 70–99)

## 2023-02-06 MED ORDER — SACUBITRIL-VALSARTAN 24-26 MG PO TABS
1.0000 | ORAL_TABLET | Freq: Two times a day (BID) | ORAL | Status: DC
  Administered 2023-02-06 – 2023-02-08 (×4): 1 via ORAL
  Filled 2023-02-06 (×4): qty 1

## 2023-02-06 MED ORDER — ONDANSETRON HCL 4 MG/2ML IJ SOLN: 4.0000 mg | Freq: Four times a day (QID) | INTRAMUSCULAR | Status: DC | PRN

## 2023-02-06 MED ORDER — IPRATROPIUM-ALBUTEROL 0.5-2.5 (3) MG/3ML IN SOLN
3.0000 mL | Freq: Four times a day (QID) | RESPIRATORY_TRACT | Status: DC
  Administered 2023-02-06 – 2023-02-08 (×7): 3 mL via RESPIRATORY_TRACT
  Filled 2023-02-06 (×7): qty 3

## 2023-02-06 MED ORDER — CARVEDILOL 6.25 MG PO TABS
6.2500 mg | ORAL_TABLET | Freq: Two times a day (BID) | ORAL | Status: DC
  Administered 2023-02-07 – 2023-02-08 (×3): 6.25 mg via ORAL
  Filled 2023-02-06 (×3): qty 1

## 2023-02-06 MED ORDER — ALBUTEROL SULFATE (2.5 MG/3ML) 0.083% IN NEBU: 2.5000 mg | INHALATION_SOLUTION | Freq: Four times a day (QID) | RESPIRATORY_TRACT | Status: DC | PRN

## 2023-02-06 MED ORDER — GABAPENTIN 100 MG PO CAPS
200.0000 mg | ORAL_CAPSULE | Freq: Three times a day (TID) | ORAL | Status: DC
  Administered 2023-02-06 – 2023-02-08 (×4): 200 mg via ORAL
  Filled 2023-02-06 (×4): qty 2

## 2023-02-06 MED ORDER — ACETAMINOPHEN 650 MG RE SUPP: 650.0000 mg | Freq: Four times a day (QID) | RECTAL | Status: DC | PRN

## 2023-02-06 MED ORDER — FUROSEMIDE 20 MG PO TABS
20.0000 mg | ORAL_TABLET | Freq: Every day | ORAL | Status: DC
  Administered 2023-02-07 – 2023-02-08 (×2): 20 mg via ORAL
  Filled 2023-02-06 (×3): qty 1

## 2023-02-06 MED ORDER — POLYETHYLENE GLYCOL 3350 17 G PO PACK: 17.0000 g | PACK | Freq: Every day | ORAL | Status: DC | PRN

## 2023-02-06 MED ORDER — OXYCODONE HCL 5 MG PO TABS: 5.0000 mg | ORAL_TABLET | Freq: Four times a day (QID) | ORAL | Status: DC | PRN

## 2023-02-06 MED ORDER — SODIUM CHLORIDE 0.9% FLUSH
3.0000 mL | Freq: Two times a day (BID) | INTRAVENOUS | Status: DC
  Administered 2023-02-06 – 2023-02-08 (×4): 3 mL via INTRAVENOUS

## 2023-02-06 MED ORDER — INSULIN GLARGINE-YFGN 100 UNIT/ML ~~LOC~~ SOLN
5.0000 [IU] | Freq: Every day | SUBCUTANEOUS | Status: DC
  Filled 2023-02-06: qty 0.05

## 2023-02-06 MED ORDER — INSULIN ASPART 100 UNIT/ML IJ SOLN
0.0000 [IU] | Freq: Three times a day (TID) | INTRAMUSCULAR | Status: DC
  Administered 2023-02-07 – 2023-02-08 (×3): 2 [IU] via SUBCUTANEOUS
  Administered 2023-02-08: 1 [IU] via SUBCUTANEOUS
  Filled 2023-02-06 (×3): qty 1

## 2023-02-06 MED ORDER — SODIUM CHLORIDE 0.9 % IV SOLN
3.0000 g | Freq: Four times a day (QID) | INTRAVENOUS | Status: DC
  Administered 2023-02-06 – 2023-02-08 (×6): 3 g via INTRAVENOUS
  Filled 2023-02-06 (×10): qty 8

## 2023-02-06 MED ORDER — APIXABAN 5 MG PO TABS
5.0000 mg | ORAL_TABLET | Freq: Two times a day (BID) | ORAL | Status: DC
  Administered 2023-02-06 – 2023-02-08 (×4): 5 mg via ORAL
  Filled 2023-02-06 (×4): qty 1

## 2023-02-06 MED ORDER — PRAVASTATIN SODIUM 20 MG PO TABS
40.0000 mg | ORAL_TABLET | Freq: Every day | ORAL | Status: DC
  Administered 2023-02-06 – 2023-02-07 (×2): 40 mg via ORAL
  Filled 2023-02-06 (×2): qty 2

## 2023-02-06 MED ORDER — PANTOPRAZOLE SODIUM 40 MG PO TBEC
40.0000 mg | DELAYED_RELEASE_TABLET | Freq: Every day | ORAL | Status: DC
  Administered 2023-02-07 – 2023-02-08 (×2): 40 mg via ORAL
  Filled 2023-02-06 (×3): qty 1

## 2023-02-06 MED ORDER — ONDANSETRON HCL 4 MG PO TABS: 4.0000 mg | ORAL_TABLET | Freq: Four times a day (QID) | ORAL | Status: DC | PRN

## 2023-02-06 MED ORDER — CLINDAMYCIN PHOSPHATE 600 MG/50ML IV SOLN
600.0000 mg | Freq: Once | INTRAVENOUS | Status: AC
  Administered 2023-02-06: 600 mg via INTRAVENOUS
  Filled 2023-02-06: qty 50

## 2023-02-06 MED ORDER — ONDANSETRON HCL 4 MG/2ML IJ SOLN
4.0000 mg | Freq: Once | INTRAMUSCULAR | Status: DC
  Filled 2023-02-06: qty 2

## 2023-02-06 MED ORDER — LIDOCAINE VISCOUS HCL 2 % MT SOLN: 15.0000 mL | Freq: Four times a day (QID) | OROMUCOSAL | Status: DC | PRN

## 2023-02-06 MED ORDER — MORPHINE SULFATE (PF) 4 MG/ML IV SOLN
4.0000 mg | Freq: Once | INTRAVENOUS | Status: DC
  Filled 2023-02-06: qty 1

## 2023-02-06 MED ORDER — ACETAMINOPHEN 325 MG PO TABS
650.0000 mg | ORAL_TABLET | Freq: Four times a day (QID) | ORAL | Status: DC | PRN
  Filled 2023-02-06: qty 2

## 2023-02-06 NOTE — ED Notes (Signed)
Pt received dinner tray. Due to swelling to the face pt can only eat the mash potatoes on the tray. Pt only too a few bites and said she is done. Pt expressed that her face hurts and lost of appetite.

## 2023-02-06 NOTE — H&P (Signed)
History and Physical    Patient: Shametra Cumberland Luffman ZOX:[id number] DOB: 08-01-1929 DOA: 02/06/2023 DOS: the patient was seen and examined on 02/06/2023 PCP: Marguarite Arbour, MD  Patient coming from: Home  Chief Complaint:  Chief Complaint  Patient presents with   Facial Swelling   Dental Pain   HPI: Janique Hoefer is a 89 y.o. female with medical history significant of HFrEF with last EF of 25%, chronic hypoxic respiratory failure on 3 L, type 2 diabetes, complete heart block s/p PPM, CAD s/p CABG (2008), chronic anemia, hypertension, hyperlipidemia, chronic pain, who presents to the ED due to facial swelling.  Ms. Giebler states that she has been having some difficulty with her bottom right teeth for a while now, but she began to experience most of her difficulty in the last week or so.  She noted that her bottom right molar was loose and painful, and so she "popped it out."  The area bled significantly but stopped on its own and she felt better.  Then approximately 2 days later, the area began to swell up and became quite painful.  She endorses increased warmth to touch, but denies any fevers, chest pain, shortness of breath, nausea, vomiting, diarrhea, abdominal pain.  She has been having some difficulty with swallowing but denies any choking episodes.  She denies any difficulty with breathing due to the swelling though.  ED course: On arrival to the ED, patient was normotensive at 125/62 with heart rate of 65.  She was saturating at 95% on her home 3 L.  She was afebrile at 99.4.Initial workup demonstrated normal WBC at 9.8, hemoglobin 10.6, sodium of 133, potassium 3.2, glucose 149, creatinine 0.66 with GFR above 60.  Lactic acid 1.6. CT of the maxillofacial region was obtained that demonstrated severe otogenic disease with multiple caries and large periapical lucency with cortical breakthrough from a right maxillary canine with extensive soft tissue stranding extending up to the TMJ  and down into the submandibular region.  No abscess.  Patient started on IV clindamycin.  TRH contacted for admission.  Review of Systems: As mentioned in the history of present illness. All other systems reviewed and are negative.  Past Medical History:  Diagnosis Date   Acute metabolic encephalopathy 06/20/2022   Anemia    Anxiety    Arthritis    Benign neoplasm of colon    CHF (congestive heart failure) (HCC)    Chronic kidney disease    Chronic pain    Complication of anesthesia 1980's   hard time waking up   Coronary artery disease with unspecified angina pectoris    Cough    Diabetes mellitus without complication (HCC)    Essential hypertension    GERD (gastroesophageal reflux disease)    High risk medication use    Hyperlipidemia    Hypoglycemia 06/20/2022   Myocardial injury 06/20/2022   Plantar fascial fibromatosis    Presence of permanent cardiac pacemaker    Shortness of breath dyspnea    Past Surgical History:  Procedure Laterality Date   ABDOMINAL HYSTERECTOMY     BACK SURGERY  1960's   cage and screws in lower back   CATARACT EXTRACTION W/ INTRAOCULAR LENS  IMPLANT, BILATERAL Bilateral    CORONARY ARTERY BYPASS GRAFT  2008   triple   HARDWARE REMOVAL Right 12/08/2019   Procedure: HARDWARE REMOVAL;  Surgeon: Kennedy Bucker, MD;  Location: [facility];  Service: Orthopedics;  Laterality: Right;   JOINT REPLACEMENT Right  hip and knee   ORIF ANKLE FRACTURE Right 07/27/2014   Procedure: OPEN REDUCTION INTERNAL FIXATION (ORIF) ANKLE FRACTURE;  Surgeon: Harvie Junior, MD;  Location: [facility];  Service: Orthopedics;  Laterality: Right;   ORIF TIBIA FRACTURE Right 12/08/2019   Procedure: OPEN REDUCTION INTERNAL FIXATION (ORIF) TIBIA FRACTURE;  Surgeon: Kennedy Bucker, MD;  Location: [facility];  Service: Orthopedics;  Laterality: Right;   PACEMAKER INSERTION Left 03/14/2015   Procedure: INSERTION PACEMAKER;  Surgeon: Marcina Millard, MD;  Location: [facility];  Service:  Cardiovascular;  Laterality: Left;   PACEMAKER LEADLESS INSERTION N/A 01/24/2020   Procedure: PACEMAKER LEADLESS INSERTION;  Surgeon: Marcina Millard, MD;  Location: [facility];  Service: Cardiovascular;  Laterality: N/A;   ROTATOR CUFF REPAIR Left    TOTAL HIP ARTHROPLASTY Right    TOTAL HIP ARTHROPLASTY Left 02/09/2018   Procedure: TOTAL HIP ARTHROPLASTY ANTERIOR APPROACH;  Surgeon: Kennedy Bucker, MD;  Location: [facility];  Service: Orthopedics;  Laterality: Left;   TOTAL KNEE ARTHROPLASTY Right    VEIN LIGATION AND STRIPPING     Social History:  reports that she has never smoked. She has never used smokeless tobacco. She reports that she does not drink alcohol and does not use drugs.  Allergies  Allergen Reactions   Ambien [Zolpidem Tartrate] Other (See Comments)    Reaction:  Keeps pt awake    Penicillins Itching and Other (See Comments)    Has patient had a PCN reaction causing immediate rash, facial/tongue/throat swelling, SOB or lightheadedness with hypotension: No Has patient had a PCN reaction causing severe rash involving mucus membranes or skin necrosis: No Has patient had a PCN reaction that required hospitalization No Has patient had a PCN reaction occurring within the last 10 years: No If all of the above answers are "NO", then may proceed with Cephalosporin use.   Iodine Itching   Succinylcholine Other (See Comments)    Reaction:  Unknown    Etodolac Other (See Comments)    GI upset   Nsaids Other (See Comments)    Reaction: gi upset    Family History  Problem Relation Age of Onset   Heart attack Mother    Heart disease Father    Alzheimer's disease Sister    Cervical cancer Sister    Heart failure Son     Prior to Admission medications   Medication Sig Start Date End Date Taking? Authorizing Provider  albuterol (PROVENTIL HFA;VENTOLIN HFA) 108 (90 Base) MCG/ACT inhaler Inhale 2 puffs into the lungs every 6 (six) hours as needed for wheezing or  shortness of breath. 01/28/17   Alford Highland, MD  alendronate (FOSAMAX) 70 MG tablet Take 70 mg by mouth once a week. 07/22/21   [provider]  apixaban (ELIQUIS) 5 MG TABS tablet Take 5 mg by mouth 2 (two) times daily.    [provider]  Aspirin-Salicylamide-Caffeine (BC HEADACHE POWDER PO) Take 1 packet by mouth 2 (two) times daily.    [provider]  calcium-vitamin D (OSCAL WITH D) 500-200 MG-UNIT tablet Take 1 tablet by mouth daily.    [provider]  carvedilol (COREG) 6.25 MG tablet Take 1 tablet (6.25 mg total) by mouth 2 (two) times daily with a meal. 01/28/17   Alford Highland, MD  furosemide (LASIX) 20 MG tablet Take 1 tablet (20 mg total) by mouth 2 (two) times daily. Patient taking differently: Take 20 mg by mouth daily. 02/27/20   Gertha Calkin, MD  gabapentin (NEURONTIN) 100  MG capsule Take 200 mg by mouth 3 (three) times daily. 06/10/22   [provider]  insulin glargine (LANTUS) 100 UNIT/ML injection Inject 0.05 mLs (5 Units total) into the skin daily. Hold if blood sugar less than 120 or if not eating well. 06/23/22   Esaw Grandchild A, DO  JARDIANCE 10 MG TABS tablet Take 10 mg by mouth daily.    [provider]  Multiple Vitamins-Minerals (PRESERVISION AREDS 2) CAPS Take 1 capsule by mouth daily.    [provider]  omeprazole (PRILOSEC) 40 MG capsule Take 40 mg by mouth daily.     [provider]  pravastatin (PRAVACHOL) 40 MG tablet Take 40 mg by mouth at bedtime.    [provider]  sacubitril-valsartan Sherryll Burger) 24-26 MG Take 1 tablet by mouth twice daily 01/19/23   Delma Freeze, FNP    Physical Exam: Vitals:   02/06/23 1310 02/06/23 1312  BP: 125/62   Pulse: 65   Resp: 16   Temp: 99.4 F (37.4 C)   TempSrc: Oral   SpO2: 95%   Weight:  71.2 kg  Height:  4\' 9"  (1.448 m)   Physical Exam Vitals and nursing note reviewed.  Constitutional:      General: She is not in acute  distress.    Appearance: She is obese. She is not toxic-appearing.  HENT:     Head: Normocephalic and atraumatic.     Comments: Patient's right cheek and submandibular area extremely edematous with slight erythema and quite tenderness to palpation.  Increased warmth to touch.    Mouth/Throat:     Mouth: Mucous membranes are moist.     Dentition: Abnormal dentition. Dental tenderness, gingival swelling and dental caries present. No dental abscesses.     Comments: Very poor dentition.  She has there are 2 right bottom molars that are broken at the root, with significant surrounding erythema and edema.  No purulent drainage can be expressed.  Oropharynx is otherwise clear Eyes:     Conjunctiva/sclera: Conjunctivae normal.     Pupils: Pupils are equal, round, and reactive to light.  Cardiovascular:     Rate and Rhythm: Normal rate and regular rhythm.     Heart sounds: Murmur (4/6 holosystolic murmur) heard.  Pulmonary:     Effort: Pulmonary effort is normal. No respiratory distress.     Breath sounds: Normal breath sounds. No wheezing, rhonchi or rales.  Abdominal:     General: Bowel sounds are normal. There is no distension.     Palpations: Abdomen is soft.     Tenderness: There is no abdominal tenderness.  Musculoskeletal:     Cervical back: Neck supple. Tenderness (Some mild tenderness at the right superior region essentially at the submandibular area) present.     Right lower leg: No edema.     Left lower leg: No edema.  Skin:    General: Skin is warm and dry.  Neurological:     General: No focal deficit present.     Mental Status: She is alert and oriented to person, place, and time. Mental status is at baseline.  Psychiatric:        Mood and Affect: Mood normal.        Behavior: Behavior normal.    Data Reviewed: CBC with WBC of 9.8, hemoglobin 10.6, platelets of 371 CMP with sodium of 133, potassium 3.2, bicarb 25, glucose 149, BUN 17, creatinine 0.66, AST 15, ALT 11 and GFR  above 60 Lactic acid 1.6  .  CT MAXILLOFACIAL WO CONTRAST  Result Date: 02/06/2023 CLINICAL DATA:  cellulitis,swelling EXAM: CT MAXILLOFACIAL WITHOUT CONTRAST TECHNIQUE: Multidetector CT imaging of the maxillofacial structures was performed. Multiplanar CT image reconstructions were also generated. RADIATION DOSE REDUCTION: This exam was performed according to the departmental dose-optimization program which includes automated exposure control, adjustment of the mA and/or kV according to patient size and/or use of iterative reconstruction technique. COMPARISON:  07/27/14 CT C spine FINDINGS: Osseous: No acute fracture. There is severe odontogenic disease with multiple dental caries and a large periapical lucency with cortical breakthrough from a right maxillary canine (series 2, image 19). There is extensive soft tissue stranding in the right Peri and submandibular region without a discrete drainable fluid collection. Inflammatory changes extend medially to involve the right parapharyngeal space, right submandibular space, in the right submental space. Inflammatory changes extend superiorly to involve the right TMJ (series 2, image 49 Orbits: Bilateral lens replacement. Orbits are otherwise unremarkable. Sinuses: No middle ear or mastoid effusion. Paranasal sinuses are notable for frothy secretions in the left sphenoid sinus, which can be seen in the setting of acute sinusitis. Soft tissues: Redemonstrated nonspecific 1.5 x 1.5 cm soft tissue lesion in the right parotid gland, unchanged compared to 07/27/14 cervical spine CT. Limited intracranial: No significant or unexpected finding. IMPRESSION: 1. Severe odontogenic disease with multiple dental caries and a large periapical lucency with cortical breakthrough from a right maxillary canine. Extensive soft tissue stranding in the right peri and submandibular region without a discrete drainable fluid collection. Inflammatory changes extend medially to involve the  right parapharyngeal space, right submandibular space, and right submental space. Inflammatory changes extend superiorly to involve the right TMJ. 2. Frothy secretions in the left sphenoid sinus, which can be seen in the setting of acute sinusitis. Electronically Signed   By: Lorenza Cambridge M.D.   On: 02/06/2023 15:27    There are no new results to review at this time.  Assessment and Plan:  * Facial cellulitis Facial cellulitis secondary to an infected molar with extension to the TMJ and down into the submandibular region.  No deep space tissue involvement per CT imaging to suggest Ludwig's angina.  Per chart review, patient was treated with amoxicillin in April and then again in June with no response, possibly due to lack of anaerobic coverage. No evidence of sepsis at this time.  - Broaden coverage to Unasyn with possible discharge on Augmentin - Discontinue clindamycin due to risk for C. difficile - Consider addition of vancomycin for MRSA coverage - Tylenol as needed for fever and mild pain - Oxycodone as needed for moderate to severe pain  Chronic heart failure (HCC) History of HFrEF with last EF of 25%.  Patient follows with the heart failure clinic.  She appears euvolemic at this time.    - Continue home Lasix and GDMT  Atrial fibrillation, chronic (HCC) - Continue home Eliquis and carvedilol  Type 2 diabetes mellitus with diabetic polyneuropathy, with long-term current use of insulin (HCC) - Hold home Jardiance - Continue home Lantus 5 units at bedtime - SSI, sensitive  CAD (coronary artery disease) - Continue home Eliquis, statin and beta-blocker  Advance Care Planning:   Code Status: DNR/DNI. Confirmed  that is still in line with patient's wishes. She would like treatment for what can be treated, but would never want to be sustained artificially, including ventilator support.   Consults: None  Family Communication: Patient's daughter-in-law/caretaker updated at  bedside  Severity of Illness: The appropriate patient  status for this patient is OBSERVATION. Observation status is judged to be reasonable and necessary in order to provide the required intensity of service to ensure the patient's safety. The patient's presenting symptoms, physical exam findings, and initial radiographic and laboratory data in the context of their medical condition is felt to place them at decreased risk for further clinical deterioration. Furthermore, it is anticipated that the patient will be medically stable for discharge from the hospital within 2 midnights of admission.   Author: Verdene Lennert, MD 02/06/2023 4:54 PM  For on call review [email or web address].

## 2023-02-06 NOTE — Assessment & Plan Note (Addendum)
-   Hold home Jardiance - Continue home Lantus 5 units at bedtime - SSI, sensitive

## 2023-02-06 NOTE — Assessment & Plan Note (Addendum)
Facial cellulitis secondary to an infected molar with extension to the TMJ and down into the submandibular region.  No deep space tissue involvement per CT imaging to suggest Ludwig's angina.  Per chart review, patient was treated with amoxicillin in April and then again in June with no response, possibly due to lack of anaerobic coverage. No evidence of sepsis at this time.  - Broaden coverage to Unasyn with possible discharge on Augmentin - Discontinue clindamycin due to risk for C. difficile - Consider addition of vancomycin for MRSA coverage - Tylenol as needed for fever and mild pain - Oxycodone as needed for moderate to severe pain

## 2023-02-06 NOTE — ED Notes (Signed)
Patient denies pain or nausea at this time. Patient and family would like morphine and Zofran held. Greig Right, PA-C aware.

## 2023-02-06 NOTE — ED Notes (Signed)
See triage notes. Patient has been having dental issues and has been being followed by [facility]. Patient stated she had a tooth become loose on Monday and she pulled it out. Stated she has another tooth that is loose and possibly cracked still in her mouth.

## 2023-02-06 NOTE — Consult Note (Signed)
Pharmacy Antibiotic Note  ASSESSMENT: 89 y.o. female with PMH T2DM, HFrEF, CAD s/p CABG (2008), s/p PPM, HTN, HLD, chronic pain is presenting with cellulitis. Pharmacy has been consulted to manage Unasyn dosing.  Patient had painful molar tooth which popped out and subsequently bled. About 2 days later, swelling began in that area and became painful and warm to touch.  Patient measurements: Height: 4\' 9"  (144.8 cm) Weight: 71.2 kg (157 lb) IBW/kg (Calculated) : 38.6  Vital signs: Temp: 99.4 F (37.4 C) (08/02 1310) Temp Source: Oral (08/02 1310) BP: 125/62 (08/02 1310) Pulse Rate: 65 (08/02 1310) Recent Labs  Lab 02/06/23 1320  WBC 9.8  CREATININE 0.66   Estimated Creatinine Clearance: 38.1 mL/min (by C-G formula based on SCr of 0.66 mg/dL).  Allergies: Allergies  Allergen Reactions   Ambien [Zolpidem Tartrate] Other (See Comments)    Reaction:  Keeps pt awake    Penicillins Itching and Other (See Comments)    Has patient had a PCN reaction causing immediate rash, facial/tongue/throat swelling, SOB or lightheadedness with hypotension: No Has patient had a PCN reaction causing severe rash involving mucus membranes or skin necrosis: No Has patient had a PCN reaction that required hospitalization No Has patient had a PCN reaction occurring within the last 10 years: No If all of the above answers are "NO", then may proceed with Cephalosporin use.   Iodine Itching   Succinylcholine Other (See Comments)    Reaction:  Unknown    Etodolac Other (See Comments)    GI upset   Nsaids Other (See Comments)    Reaction: gi upset    Antimicrobials this admission: Clindamycin x 1  Dose adjustments this admission: N/A  Microbiology results: N/A   PLAN: Initiate Unasyn 3 g IV q6H Follow up culture results to assess for antibiotic optimization. Monitor renal function to assess for any necessary antibiotic dosing changes.   Thank you for allowing pharmacy to be a part of this  patient's care.  Will M. Dareen Piano, PharmD Clinical Pharmacist 02/06/2023 4:48 PM

## 2023-02-06 NOTE — Assessment & Plan Note (Signed)
-   Continue home Eliquis, statin and beta-blocker

## 2023-02-06 NOTE — ED Notes (Signed)
Hospitalist at bedside 

## 2023-02-06 NOTE — ED Triage Notes (Signed)
Pt went to the dentist today who sent the pt to the ER, pt was having an eval for a full mouth extraction however pt has been having right lower jaw swelling for the past week with worsening in the past few days. Pt states that her teeth have been falling out and has been pulling bits of tooth from her mouth

## 2023-02-06 NOTE — ED Provider Notes (Signed)
[facility] Provider Note    Event Date/Time   First MD Initiated Contact with Patient 02/06/23 1328     (approximate)   History   Facial Swelling and Dental Pain   HPI  Amber [name] is a 89 y.o. female with history of diabetes, hyperlipidemia, hypertension, CHF, CAD, pacemaker, CKD presents emergency department with right-sided facial swelling.  Patient was seen at her dentist office to hopefully have the port teeth removed.  However the dentist states she cannot treat her there at this time due to the large amount of infection.  Would like her to come to the ED for IV antibiotics.  Patient states she is unsure if she has had fever but has felt chills.      Physical Exam   Triage Vital Signs: ED Triage Vitals  Encounter Vitals Group     BP 02/06/23 1310 125/62     Systolic BP Percentile --      Diastolic BP Percentile --      Pulse Rate 02/06/23 1310 65     Resp 02/06/23 1310 16     Temp 02/06/23 1310 99.4 F (37.4 C)     Temp Source 02/06/23 1310 Oral     SpO2 02/06/23 1310 95 %     Weight 02/06/23 1312 157 lb (71.2 kg)     Height 02/06/23 1312 4\' 9"  (1.448 m)     Head Circumference --      Peak Flow --      Pain Score 02/06/23 1312 10     Pain Loc --      Pain Education --      Exclude from Growth Chart --     Most recent vital signs: Vitals:   02/06/23 1310  BP: 125/62  Pulse: 65  Resp: 16  Temp: 99.4 F (37.4 C)  SpO2: 95%     General: Awake, no distress.   CV:  Good peripheral perfusion. regular rate and  rhythm Resp:  Normal effort. Lungs cta Abd:  No distention.   Other:  Large amount of right-sided facial swelling, area is tender to palpation, neck is supple, no lymphadenopathy   ED Results / Procedures / Treatments   Labs (all labs ordered are listed, but only abnormal results are displayed) Labs Reviewed  CBC - Abnormal; Notable for the following components:      Result Value   RBC 3.68 (*)     Hemoglobin 10.6 (*)    HCT 33.0 (*)    All other components within normal limits  COMPREHENSIVE METABOLIC PANEL - Abnormal; Notable for the following components:   Sodium 133 (*)    Potassium 3.2 (*)    Chloride 97 (*)    Glucose, Bld 149 (*)    Calcium 8.7 (*)    Albumin 3.1 (*)    All other components within normal limits  LACTIC ACID, PLASMA     EKG     RADIOLOGY CT soft tissue maxillofacial    PROCEDURES:   Procedures   MEDICATIONS ORDERED IN ED: Medications  morphine (PF) 4 MG/ML injection 4 mg (0 mg Intravenous Hold 02/06/23 1412)  ondansetron (ZOFRAN) injection 4 mg (0 mg Intravenous Hold 02/06/23 1412)  clindamycin (CLEOCIN) IVPB 600 mg (0 mg Intravenous Stopped 02/06/23 1522)     IMPRESSION / MDM / ASSESSMENT AND PLAN / ED COURSE  I reviewed the triage vital signs and the nursing notes.  Differential diagnosis includes, but is not limited to, cellulitis, abscess, sepsis  Patient's presentation is most consistent with acute presentation with potential threat to life or bodily function.   Due to the amount of infection noted in the face along with patient having chills we will add a lactic acid to the lab work that they initiated in triage.  Saline lock inserted by nursing staff, clindamycin 600 mg IV ordered.  CT soft tissue maxillofacial ordered.  Patient was given morphine and Zofran for pain   CT maxillofacial, I did independently review and interpret the radiologist report as being positive for large cellulitic but no drainable abscess.  Consult hospitalist for admission, Dr. Fanny Bien in to see the patient  Hospitalist,Dr Huel Cote to admit the patient. Family aware of admission.  Patient agreeable to admission.   FINAL CLINICAL IMPRESSION(S) / ED DIAGNOSES   Final diagnoses:  Facial cellulitis  Dental abscess     Rx / DC Orders   ED Discharge Orders     None        Note:  This document was prepared using  Dragon voice recognition software and may include unintentional dictation errors.    Faythe Ghee, PA-C 02/06/23 Dorthea Cove, MD 02/07/23 [phone]

## 2023-02-06 NOTE — Assessment & Plan Note (Signed)
History of HFrEF with last EF of 25%.  Patient follows with the heart failure clinic.  She appears euvolemic at this time.    - Continue home Lasix and GDMT

## 2023-02-06 NOTE — Assessment & Plan Note (Signed)
-   Continue home Eliquis and carvedilol

## 2023-02-06 NOTE — ED Notes (Signed)
Patient taken to CT scan.

## 2023-02-06 NOTE — ED Provider Notes (Addendum)
Medical screening examination/treatment/procedure(s) were conducted as a shared visit with non-physician practitioner(s) and myself.  I personally evaluated the patient during the encounter.    Sharyn Creamer, MD 02/06/23 1558  I also called and left a message requesting return call back with her oral surgeon who saw her today.  She was referred to the ER per the notation for further treatment in the emergency department after seeing him in the office    Sharyn Creamer, MD 02/06/23 1559

## 2023-02-07 ENCOUNTER — Encounter: Payer: Self-pay | Admitting: Internal Medicine

## 2023-02-07 DIAGNOSIS — I110 Hypertensive heart disease with heart failure: Secondary | ICD-10-CM | POA: Diagnosis present

## 2023-02-07 DIAGNOSIS — K047 Periapical abscess without sinus: Secondary | ICD-10-CM | POA: Diagnosis present

## 2023-02-07 DIAGNOSIS — I5022 Chronic systolic (congestive) heart failure: Secondary | ICD-10-CM | POA: Diagnosis present

## 2023-02-07 DIAGNOSIS — L03211 Cellulitis of face: Secondary | ICD-10-CM | POA: Diagnosis present

## 2023-02-07 DIAGNOSIS — I442 Atrioventricular block, complete: Secondary | ICD-10-CM | POA: Diagnosis present

## 2023-02-07 DIAGNOSIS — E1122 Type 2 diabetes mellitus with diabetic chronic kidney disease: Secondary | ICD-10-CM | POA: Diagnosis present

## 2023-02-07 DIAGNOSIS — Z7901 Long term (current) use of anticoagulants: Secondary | ICD-10-CM | POA: Diagnosis not present

## 2023-02-07 DIAGNOSIS — J9611 Chronic respiratory failure with hypoxia: Secondary | ICD-10-CM | POA: Diagnosis present

## 2023-02-07 DIAGNOSIS — E669 Obesity, unspecified: Secondary | ICD-10-CM | POA: Diagnosis present

## 2023-02-07 DIAGNOSIS — Z8249 Family history of ischemic heart disease and other diseases of the circulatory system: Secondary | ICD-10-CM | POA: Diagnosis not present

## 2023-02-07 DIAGNOSIS — K219 Gastro-esophageal reflux disease without esophagitis: Secondary | ICD-10-CM | POA: Diagnosis present

## 2023-02-07 DIAGNOSIS — Z66 Do not resuscitate: Secondary | ICD-10-CM | POA: Diagnosis present

## 2023-02-07 DIAGNOSIS — E1142 Type 2 diabetes mellitus with diabetic polyneuropathy: Secondary | ICD-10-CM | POA: Diagnosis present

## 2023-02-07 DIAGNOSIS — I482 Chronic atrial fibrillation, unspecified: Secondary | ICD-10-CM | POA: Diagnosis present

## 2023-02-07 DIAGNOSIS — Z86010 Personal history of colonic polyps: Secondary | ICD-10-CM | POA: Diagnosis not present

## 2023-02-07 DIAGNOSIS — Z888 Allergy status to other drugs, medicaments and biological substances status: Secondary | ICD-10-CM | POA: Diagnosis not present

## 2023-02-07 DIAGNOSIS — Z7984 Long term (current) use of oral hypoglycemic drugs: Secondary | ICD-10-CM | POA: Diagnosis not present

## 2023-02-07 DIAGNOSIS — Z794 Long term (current) use of insulin: Secondary | ICD-10-CM | POA: Diagnosis not present

## 2023-02-07 DIAGNOSIS — Z96651 Presence of right artificial knee joint: Secondary | ICD-10-CM | POA: Diagnosis present

## 2023-02-07 DIAGNOSIS — K029 Dental caries, unspecified: Secondary | ICD-10-CM | POA: Diagnosis present

## 2023-02-07 DIAGNOSIS — Z951 Presence of aortocoronary bypass graft: Secondary | ICD-10-CM | POA: Diagnosis not present

## 2023-02-07 DIAGNOSIS — E785 Hyperlipidemia, unspecified: Secondary | ICD-10-CM | POA: Diagnosis present

## 2023-02-07 DIAGNOSIS — I2510 Atherosclerotic heart disease of native coronary artery without angina pectoris: Secondary | ICD-10-CM | POA: Diagnosis present

## 2023-02-07 DIAGNOSIS — Z880 Allergy status to penicillin: Secondary | ICD-10-CM | POA: Diagnosis not present

## 2023-02-07 DIAGNOSIS — Z950 Presence of cardiac pacemaker: Secondary | ICD-10-CM | POA: Diagnosis not present

## 2023-02-07 LAB — GLUCOSE, CAPILLARY
Glucose-Capillary: 112 mg/dL — ABNORMAL HIGH (ref 70–99)
Glucose-Capillary: 178 mg/dL — ABNORMAL HIGH (ref 70–99)
Glucose-Capillary: 158 mg/dL — ABNORMAL HIGH (ref 70–99)
Glucose-Capillary: 185 mg/dL — ABNORMAL HIGH (ref 70–99)

## 2023-02-07 MED ORDER — INSULIN GLARGINE-YFGN 100 UNIT/ML ~~LOC~~ SOLN
5.0000 [IU] | Freq: Every day | SUBCUTANEOUS | Status: DC
  Administered 2023-02-07: 5 [IU] via SUBCUTANEOUS
  Filled 2023-02-07 (×2): qty 0.05

## 2023-02-07 NOTE — Progress Notes (Signed)
Progress Note   Patient: Amber [name] GLO:[id number] DOB: Feb 01, 1930 DOA: 02/06/2023     0 DOS: the patient was seen and examined on 02/07/2023    Subjective:  Patient seen and examined at bedside this morning Still having swelling involving the right side of the face Denies significant pain or discomfort Denies nausea vomiting abdominal pain chest pain or cough  Brief hospital course: Amber [name] is a 89 y.o. female with medical history significant of HFrEF with last EF of 25%, chronic hypoxic respiratory failure on 3 L, type 2 diabetes, complete heart block s/p PPM, CAD s/p CABG (2008), chronic anemia, hypertension, hyperlipidemia, chronic pain, who presents to the ED due to facial swelling.   Amber [name] states that she has been having some difficulty with her bottom right teeth for a while now, but she began to experience most of her difficulty in the last week or so.  She noted that her bottom right molar was loose and painful, and so she "popped it out."  The area bled significantly but stopped on its own and she felt better.  Then approximately 2 days later, the area began to swell up and became quite painful.  ED course:  CT of the maxillofacial region was obtained that demonstrated severe otogenic disease with multiple caries and large periapical lucency with cortical breakthrough from a right maxillary canine with extensive soft tissue stranding extending up to the TMJ and down into the submandibular region.  No abscess.   [facility] contacted for admission.  Assessment and Plan:  Facial cellulitis Facial cellulitis secondary to an infected molar with extension to the TMJ and down into the submandibular region.  CT scan of the maxillofacial region did not show any findings suggestive of Ludwig angina.  No deep space tissue involvement per CT imaging to suggest Ludwig's angina.  Per chart review, patient was treated with amoxicillin in April and then again in June with no  response, possibly due to lack of anaerobic coverage. No evidence of sepsis at this time. Continue current IV antibiotics Continue as needed pain medication    Chronic heart failure (HCC) History of HFrEF with last EF of 25%. Currently euvolemic He follows up with heart failure clinic Continue home dose of Lasix and other GDMT Monitor input and output   Atrial fibrillation, chronic (HCC) Continue home Eliquis and carvedilol   Type 2 diabetes mellitus with diabetic polyneuropathy, with long-term current use of insulin (HCC) - Continue to hold Jardiance - Continue Lantus Continue sliding scale insulin therapy Monitor glucose level  CAD (coronary artery disease) - Continue home Eliquis, statin and beta-blocker   Advance Care Planning:   Code Status: DNR/DNI   Family Communication: Patient's daughter-in-law/caretaker updated at bedside     Physical Exam:  Head: Normocephalic and atraumatic.  Noted swelling in the right cheek and mandibular area Eyes:     Conjunctiva/sclera: Conjunctivae normal.     Pupils: Pupils are equal, round, and reactive to light.  Cardiovascular:     Rate and Rhythm: Normal rate and regular rhythm.     Heart sounds: Murmur (4/6 holosystolic murmur) heard.  Pulmonary:     Effort: Pulmonary effort is normal. No respiratory distress.     Breath sounds: Normal breath sounds. No wheezing, rhonchi or rales.  Abdominal: Nontender Skin:    General: Skin is warm and dry.  Neurological:     General: No focal deficit present.     Mental Status: She is alert and oriented  to person, place, and time. Mental status is at baseline.  Psychiatric:        Mood and Affect: Mood normal.        Behavior: Behavior normal.   Vitals:   02/07/23 0815 02/07/23 0842 02/07/23 1131 02/07/23 1521  BP:  (!) 174/74    Pulse:  61    Resp:  20    Temp:  98 F (36.7 C)    TempSrc:  Oral    SpO2: 94% 95% 97% 97%  Weight:      Height:        Data Reviewed: Have  reviewed patient's maxillofacial CT scan with results as documented above, previous chart, CBC, BMP, nursing documentation ED provider documentation as well as vitals above    Author: Loyce Dys, MD 02/07/2023 3:33 PM  For on call review [email or web address].

## 2023-02-07 NOTE — Plan of Care (Signed)

## 2023-02-08 DIAGNOSIS — L03211 Cellulitis of face: Secondary | ICD-10-CM | POA: Diagnosis not present

## 2023-02-08 LAB — CBC WITH DIFFERENTIAL/PLATELET
Abs Immature Granulocytes: 0.06 10*3/uL (ref 0.00–0.07)
Basophils Absolute: 0.1 10*3/uL (ref 0.0–0.1)
Basophils Relative: 1 %
Eosinophils Absolute: 0.3 10*3/uL (ref 0.0–0.5)
Eosinophils Relative: 3 %
HCT: 33.6 % — ABNORMAL LOW (ref 36.0–46.0)
Hemoglobin: 11.1 g/dL — ABNORMAL LOW (ref 12.0–15.0)
Immature Granulocytes: 1 %
Lymphocytes Relative: 10 %
Lymphs Abs: 1 10*3/uL (ref 0.7–4.0)
MCH: 29.1 pg (ref 26.0–34.0)
MCHC: 33 g/dL (ref 30.0–36.0)
MCV: 88 fL (ref 80.0–100.0)
Monocytes Absolute: 1.2 10*3/uL — ABNORMAL HIGH (ref 0.1–1.0)
Monocytes Relative: 11 %
Neutro Abs: 8 10*3/uL — ABNORMAL HIGH (ref 1.7–7.7)
Neutrophils Relative %: 74 %
Platelets: 387 10*3/uL (ref 150–400)
RBC: 3.82 MIL/uL — ABNORMAL LOW (ref 3.87–5.11)
RDW: 14.7 % (ref 11.5–15.5)
WBC: 10.6 10*3/uL — ABNORMAL HIGH (ref 4.0–10.5)
nRBC: 0 % (ref 0.0–0.2)

## 2023-02-08 LAB — BASIC METABOLIC PANEL WITH GFR
Anion gap: 11 (ref 5–15)
BUN: 19 mg/dL (ref 8–23)
CO2: 23 mmol/L (ref 22–32)
Calcium: 8.9 mg/dL (ref 8.9–10.3)
Chloride: 102 mmol/L (ref 98–111)
Creatinine, Ser: 0.7 mg/dL (ref 0.44–1.00)
GFR, Estimated: >60 mL/min (ref >60)
Glucose, Bld: 164 mg/dL — ABNORMAL HIGH (ref 70–99)
Potassium: 3.8 mmol/L (ref 3.5–5.1)
Sodium: 136 mmol/L (ref 135–145)

## 2023-02-08 LAB — GLUCOSE, CAPILLARY
Glucose-Capillary: 143 mg/dL — ABNORMAL HIGH (ref 70–99)
Glucose-Capillary: 167 mg/dL — ABNORMAL HIGH (ref 70–99)

## 2023-02-08 MED ORDER — AMOXICILLIN-POT CLAVULANATE 875-125 MG PO TABS: 1.0000 | ORAL_TABLET | Freq: Two times a day (BID) | ORAL | 0 refills | Status: DC

## 2023-02-08 MED ORDER — SODIUM CHLORIDE 0.9 % IV SOLN
3.0000 g | Freq: Four times a day (QID) | INTRAVENOUS | Status: DC
  Filled 2023-02-08 (×2): qty 8

## 2023-02-08 MED ORDER — POLYETHYLENE GLYCOL 3350 17 G PO PACK: 17.0000 g | PACK | Freq: Every day | ORAL | 0 refills | Status: AC | PRN

## 2023-02-08 MED ORDER — AMOXICILLIN-POT CLAVULANATE 875-125 MG PO TABS
1.0000 | ORAL_TABLET | Freq: Two times a day (BID) | ORAL | Status: DC
  Administered 2023-02-08: 1 via ORAL
  Filled 2023-02-08: qty 1

## 2023-02-08 MED ORDER — ONDANSETRON HCL 4 MG PO TABS: 4.0000 mg | ORAL_TABLET | Freq: Four times a day (QID) | ORAL | 0 refills | Status: DC | PRN

## 2023-02-08 MED ORDER — AMOXICILLIN-POT CLAVULANATE 875-125 MG PO TABS: 1.0000 | ORAL_TABLET | Freq: Two times a day (BID) | ORAL | Status: DC

## 2023-02-08 MED ORDER — ACETAMINOPHEN 325 MG PO TABS: 650.0000 mg | ORAL_TABLET | Freq: Four times a day (QID) | ORAL | 0 refills | Status: AC | PRN

## 2023-02-08 NOTE — Plan of Care (Signed)
  Problem: Education: Goal: Ability to describe self-care measures that may prevent or decrease complications (Diabetes Survival Skills Education) will improve Outcome: Progressing   Problem: Coping: Goal: Ability to adjust to condition or change in health will improve Outcome: Progressing   Problem: Nutritional: Goal: Maintenance of adequate nutrition will improve Outcome: Progressing   Problem: Health Behavior/Discharge Planning: Goal: Ability to manage health-related needs will improve Outcome: Progressing

## 2023-02-08 NOTE — Discharge Summary (Signed)
Physician Discharge Summary   Patient: Amber [name] MRN: [id number] DOB: [birth year]  Admit date:     02/06/2023  Discharge date: 02/08/23  Discharge Physician: Loyce Dys   PCP: Marguarite Arbour, MD    Discharge Diagnoses: Facial cellulitis in the setting of dental caries Chronic systolic heart failure [facility]) Atrial fibrillation, chronic (HCC) Type 2 diabetes mellitus with diabetic polyneuropathy, with long-term current use of insulin (HCC) CAD (coronary artery disease)     Hospital Course: Amber [name] is a 89 y.o. female with medical history significant of HFrEF with last EF of 25%, chronic hypoxic respiratory failure on 3 L, type 2 diabetes, complete heart block s/p PPM, CAD s/p CABG (2008), chronic anemia, hypertension, hyperlipidemia, chronic pain, who presents to the ED due to facial swelling.  CT of the maxillofacial region was obtained that demonstrated severe otogenic disease with multiple caries and large periapical lucency with cortical breakthrough from a right maxillary canine with extensive soft tissue stranding extending up to the TMJ and down into the submandibular region.  CT scan of the maxillofacial region did not show any findings suggestive of Ludwig angina.  No deep space tissue involvement per CT imaging to suggest Ludwig's angina.  Patient was managed with IV antibiotics with improvement of swelling and resolution of pain and therefore being discharged on oral antibiotics to continue following up with dentist.   Consultants: None Procedures performed: None  Disposition: Home Diet recommendation:  Cardiac diet  DISCHARGE MEDICATION: Allergies as of 02/08/2023       Reactions   Ambien [zolpidem Tartrate] Other (See Comments)   Reaction:  Keeps pt awake    Penicillins Itching, Other (See Comments)   Has patient had a PCN reaction causing immediate rash, facial/tongue/throat swelling, SOB or lightheadedness with hypotension: No Has patient had  a PCN reaction causing severe rash involving mucus membranes or skin necrosis: No Has patient had a PCN reaction that required hospitalization No Has patient had a PCN reaction occurring within the last 10 years: No If all of the above answers are "NO", then may proceed with Cephalosporin use.   Iodine Itching   Succinylcholine Other (See Comments)   Reaction:  Unknown    Etodolac Other (See Comments)   GI upset   Nsaids Other (See Comments)   Reaction: gi upset        Medication List     TAKE these medications    acetaminophen 325 MG tablet Commonly known as: TYLENOL Take 2 tablets (650 mg total) by mouth every 6 (six) hours as needed for mild pain (or Fever >/= 101).   albuterol 108 (90 Base) MCG/ACT inhaler Commonly known as: VENTOLIN HFA Inhale 2 puffs into the lungs every 6 (six) hours as needed for wheezing or shortness of breath.   alendronate 70 MG tablet Commonly known as: FOSAMAX Take 70 mg by mouth every Sunday.   amoxicillin-clavulanate 875-125 MG tablet Commonly known as: AUGMENTIN Take 1 tablet by mouth every 12 (twelve) hours.   apixaban 5 MG Tabs tablet Commonly known as: ELIQUIS Take 5 mg by mouth 2 (two) times daily.   calcium-vitamin D 500-200 MG-UNIT tablet Commonly known as: OSCAL WITH D Take 1 tablet by mouth every other day.   carvedilol 6.25 MG tablet Commonly known as: COREG Take 1 tablet (6.25 mg total) by mouth 2 (two) times daily with a meal.   empagliflozin 10 MG Tabs tablet Commonly known as: JARDIANCE Take 10 mg by mouth daily.  Entresto 24-26 MG Generic drug: sacubitril-valsartan Take 1 tablet by mouth twice daily   furosemide 20 MG tablet Commonly known as: Lasix Take 1 tablet (20 mg total) by mouth 2 (two) times daily. What changed: when to take this   gabapentin 100 MG capsule Commonly known as: NEURONTIN Take 200 mg by mouth 3 (three) times daily.   insulin glargine 100 UNIT/ML injection Commonly known as:  LANTUS Inject 0.05 mLs (5 Units total) into the skin daily. Hold if blood sugar less than 120 or if not eating well.   ipratropium-albuterol 0.5-2.5 (3) MG/3ML Soln Commonly known as: DUONEB Take 3 mLs by nebulization 4 (four) times daily.   omeprazole 40 MG capsule Commonly known as: PRILOSEC Take 40 mg by mouth daily.   ondansetron 4 MG tablet Commonly known as: ZOFRAN Take 1 tablet (4 mg total) by mouth every 6 (six) hours as needed for nausea.   polyethylene glycol 17 g packet Commonly known as: MIRALAX / GLYCOLAX Take 17 g by mouth daily as needed for mild constipation.   pravastatin 40 MG tablet Commonly known as: PRAVACHOL Take 40 mg by mouth at bedtime.   PreserVision AREDS 2 Caps Take 1 capsule by mouth every other day.        Discharge Exam:  Head: Normocephalic and atraumatic.  Noted swelling in the right cheek and mandibular area Eyes:     Conjunctiva/sclera: Conjunctivae normal.     Pupils: Pupils are equal, round, and reactive to light.  Cardiovascular:     Rate and Rhythm: Normal rate and regular rhythm.     Heart sounds: Murmur (4/6 holosystolic murmur) heard.  Pulmonary:     Effort: Pulmonary effort is normal. No respiratory distress.     Breath sounds: Normal breath sounds. No wheezing, rhonchi or rales.  Abdominal: Nontender Skin:    General: Skin is warm and dry.  Neurological:     General: No focal deficit present.     Mental Status: She is alert and oriented to person, place, and time. Mental status is at baseline.  Psychiatric:        Mood and Affect: Mood normal.        Behavior: Behavior normal.  Condition at discharge: good   Discharge time spent:  35 minutes.  Signed: Loyce Dys, MD [facility] 02/08/2023

## 2023-02-08 NOTE — Progress Notes (Signed)
Patient was discharge home with family member present for discharge summary was reviewed with family and teach back method was used to check understanding. All patients belongings were packed and given to family member, IV was taken out and tolerated well.

## 2023-06-11 ENCOUNTER — Other Ambulatory Visit: Payer: Self-pay

## 2023-06-11 ENCOUNTER — Emergency Department: Payer: 59

## 2023-06-11 ENCOUNTER — Inpatient Hospital Stay
Admission: EM | Admit: 2023-06-11 | Discharge: 2023-06-14 | DRG: 158 | Disposition: A | Discharge status: Home / Self Care | Payer: 59 | Attending: Internal Medicine | Admitting: Internal Medicine

## 2023-06-11 ENCOUNTER — Encounter: Payer: Self-pay | Admitting: Emergency Medicine

## 2023-06-11 DIAGNOSIS — J9611 Chronic respiratory failure with hypoxia: Secondary | ICD-10-CM | POA: Diagnosis present

## 2023-06-11 DIAGNOSIS — L03211 Cellulitis of face: Principal | ICD-10-CM | POA: Diagnosis present

## 2023-06-11 DIAGNOSIS — Z886 Allergy status to analgesic agent status: Secondary | ICD-10-CM

## 2023-06-11 DIAGNOSIS — Z7984 Long term (current) use of oral hypoglycemic drugs: Secondary | ICD-10-CM

## 2023-06-11 DIAGNOSIS — M8660 Other chronic osteomyelitis, unspecified site: Secondary | ICD-10-CM

## 2023-06-11 DIAGNOSIS — Z6835 Body mass index (BMI) 35.0-35.9, adult: Secondary | ICD-10-CM

## 2023-06-11 DIAGNOSIS — E785 Hyperlipidemia, unspecified: Secondary | ICD-10-CM | POA: Diagnosis present

## 2023-06-11 DIAGNOSIS — Z951 Presence of aortocoronary bypass graft: Secondary | ICD-10-CM

## 2023-06-11 DIAGNOSIS — Z7901 Long term (current) use of anticoagulants: Secondary | ICD-10-CM

## 2023-06-11 DIAGNOSIS — M272 Inflammatory conditions of jaws: Secondary | ICD-10-CM | POA: Diagnosis not present

## 2023-06-11 DIAGNOSIS — N189 Chronic kidney disease, unspecified: Secondary | ICD-10-CM | POA: Diagnosis present

## 2023-06-11 DIAGNOSIS — Z8049 Family history of malignant neoplasm of other genital organs: Secondary | ICD-10-CM

## 2023-06-11 DIAGNOSIS — A419 Sepsis, unspecified organism: Secondary | ICD-10-CM

## 2023-06-11 DIAGNOSIS — Z7983 Long term (current) use of bisphosphonates: Secondary | ICD-10-CM

## 2023-06-11 DIAGNOSIS — K219 Gastro-esophageal reflux disease without esophagitis: Secondary | ICD-10-CM | POA: Diagnosis present

## 2023-06-11 DIAGNOSIS — I2510 Atherosclerotic heart disease of native coronary artery without angina pectoris: Secondary | ICD-10-CM | POA: Diagnosis present

## 2023-06-11 DIAGNOSIS — J449 Chronic obstructive pulmonary disease, unspecified: Secondary | ICD-10-CM | POA: Diagnosis present

## 2023-06-11 DIAGNOSIS — Z96643 Presence of artificial hip joint, bilateral: Secondary | ICD-10-CM | POA: Diagnosis present

## 2023-06-11 DIAGNOSIS — I5022 Chronic systolic (congestive) heart failure: Secondary | ICD-10-CM | POA: Diagnosis present

## 2023-06-11 DIAGNOSIS — Z79899 Other long term (current) drug therapy: Secondary | ICD-10-CM

## 2023-06-11 DIAGNOSIS — I482 Chronic atrial fibrillation, unspecified: Secondary | ICD-10-CM | POA: Diagnosis present

## 2023-06-11 DIAGNOSIS — Z96651 Presence of right artificial knee joint: Secondary | ICD-10-CM | POA: Diagnosis present

## 2023-06-11 DIAGNOSIS — E1169 Type 2 diabetes mellitus with other specified complication: Principal | ICD-10-CM | POA: Diagnosis present

## 2023-06-11 DIAGNOSIS — Z8249 Family history of ischemic heart disease and other diseases of the circulatory system: Secondary | ICD-10-CM

## 2023-06-11 DIAGNOSIS — Z794 Long term (current) use of insulin: Secondary | ICD-10-CM

## 2023-06-11 DIAGNOSIS — G8929 Other chronic pain: Secondary | ICD-10-CM | POA: Diagnosis present

## 2023-06-11 DIAGNOSIS — I480 Paroxysmal atrial fibrillation: Secondary | ICD-10-CM | POA: Diagnosis present

## 2023-06-11 DIAGNOSIS — Z9981 Dependence on supplemental oxygen: Secondary | ICD-10-CM

## 2023-06-11 DIAGNOSIS — I10 Essential (primary) hypertension: Secondary | ICD-10-CM | POA: Diagnosis present

## 2023-06-11 DIAGNOSIS — Z820 Family history of epilepsy and other diseases of the nervous system: Secondary | ICD-10-CM

## 2023-06-11 DIAGNOSIS — E66812 Obesity, class 2: Secondary | ICD-10-CM | POA: Diagnosis present

## 2023-06-11 DIAGNOSIS — Z888 Allergy status to other drugs, medicaments and biological substances status: Secondary | ICD-10-CM

## 2023-06-11 DIAGNOSIS — Z880 Allergy status to penicillin: Secondary | ICD-10-CM

## 2023-06-11 DIAGNOSIS — I130 Hypertensive heart and chronic kidney disease with heart failure and stage 1 through stage 4 chronic kidney disease, or unspecified chronic kidney disease: Secondary | ICD-10-CM | POA: Diagnosis present

## 2023-06-11 DIAGNOSIS — E1142 Type 2 diabetes mellitus with diabetic polyneuropathy: Secondary | ICD-10-CM | POA: Diagnosis present

## 2023-06-11 DIAGNOSIS — G4733 Obstructive sleep apnea (adult) (pediatric): Secondary | ICD-10-CM | POA: Diagnosis present

## 2023-06-11 DIAGNOSIS — E1122 Type 2 diabetes mellitus with diabetic chronic kidney disease: Secondary | ICD-10-CM | POA: Diagnosis present

## 2023-06-11 DIAGNOSIS — Z950 Presence of cardiac pacemaker: Secondary | ICD-10-CM

## 2023-06-11 LAB — URINALYSIS, W/ REFLEX TO CULTURE (INFECTION SUSPECTED)
Bacteria, UA: NONE SEEN
Bilirubin Urine: NEGATIVE
Glucose, UA: >=500 mg/dL — AB
Hgb urine dipstick: NEGATIVE
Ketones, ur: NEGATIVE mg/dL
Leukocytes,Ua: NEGATIVE
Nitrite: NEGATIVE
Protein, ur: NEGATIVE mg/dL
Specific Gravity, Urine: 1.035 — ABNORMAL HIGH (ref 1.005–1.030)
Squamous Epithelial / HPF: 0 /[HPF] (ref 0–5)
pH: 5 (ref 5.0–8.0)

## 2023-06-11 LAB — CBC WITH DIFFERENTIAL/PLATELET
Abs Immature Granulocytes: 0.04 10*3/uL (ref 0.00–0.07)
Basophils Absolute: 0 10*3/uL (ref 0.0–0.1)
Basophils Relative: 1 %
Eosinophils Absolute: 0.3 10*3/uL (ref 0.0–0.5)
Eosinophils Relative: 4 %
HCT: 35.4 % — ABNORMAL LOW (ref 36.0–46.0)
Hemoglobin: 11 g/dL — ABNORMAL LOW (ref 12.0–15.0)
Immature Granulocytes: 1 %
Lymphocytes Relative: 14 %
Lymphs Abs: 0.9 10*3/uL (ref 0.7–4.0)
MCH: 26.5 pg (ref 26.0–34.0)
MCHC: 31.1 g/dL (ref 30.0–36.0)
MCV: 85.3 fL (ref 80.0–100.0)
Monocytes Absolute: 0.8 10*3/uL (ref 0.1–1.0)
Monocytes Relative: 12 %
Neutro Abs: 4.6 10*3/uL (ref 1.7–7.7)
Neutrophils Relative %: 68 %
Platelets: 431 10*3/uL — ABNORMAL HIGH (ref 150–400)
RBC: 4.15 MIL/uL (ref 3.87–5.11)
RDW: 16.7 % — ABNORMAL HIGH (ref 11.5–15.5)
WBC: 6.7 10*3/uL (ref 4.0–10.5)
nRBC: 0 % (ref 0.0–0.2)

## 2023-06-11 LAB — COMPREHENSIVE METABOLIC PANEL
ALT: 11 U/L (ref 0–44)
AST: 15 U/L (ref 15–41)
Albumin: 3.3 g/dL — ABNORMAL LOW (ref 3.5–5.0)
Alkaline Phosphatase: 63 U/L (ref 38–126)
Anion gap: 12 (ref 5–15)
BUN: 16 mg/dL (ref 8–23)
CO2: 25 mmol/L (ref 22–32)
Calcium: 9.3 mg/dL (ref 8.9–10.3)
Chloride: 99 mmol/L (ref 98–111)
Creatinine, Ser: 0.66 mg/dL (ref 0.44–1.00)
GFR, Estimated: >60 mL/min (ref >60)
Glucose, Bld: 189 mg/dL — ABNORMAL HIGH (ref 70–99)
Potassium: 4.2 mmol/L (ref 3.5–5.1)
Sodium: 136 mmol/L (ref 135–145)
Total Bilirubin: 0.4 mg/dL (ref <1.2)
Total Protein: 7.7 g/dL (ref 6.5–8.1)

## 2023-06-11 LAB — LACTIC ACID, PLASMA
Lactic Acid, Venous: 2.4 mmol/L (ref 0.5–1.9)
Lactic Acid, Venous: 1.5 mmol/L (ref 0.5–1.9)

## 2023-06-11 LAB — PROTIME-INR
INR: 1.4 — ABNORMAL HIGH (ref 0.8–1.2)
Prothrombin Time: 17.1 s — ABNORMAL HIGH (ref 11.4–15.2)

## 2023-06-11 MED ORDER — ONDANSETRON HCL 4 MG/2ML IJ SOLN
4.0000 mg | Freq: Once | INTRAMUSCULAR | Status: AC
Start: 2023-06-11 — End: 2023-06-11
  Administered 2023-06-11: 4 mg via INTRAVENOUS
  Filled 2023-06-11: qty 2

## 2023-06-11 MED ORDER — SODIUM CHLORIDE 0.9 % IV BOLUS (SEPSIS)
250.0000 mL | Freq: Once | INTRAVENOUS | Status: AC
Start: 2023-06-12 — End: 2023-06-12
  Administered 2023-06-12: 250 mL via INTRAVENOUS

## 2023-06-11 MED ORDER — SODIUM CHLORIDE 0.9 % IV BOLUS (SEPSIS)
1000.0000 mL | Freq: Once | INTRAVENOUS | Status: AC
Start: 2023-06-11 — End: 2023-06-12
  Administered 2023-06-11: 1000 mL via INTRAVENOUS

## 2023-06-11 MED ORDER — VANCOMYCIN HCL IN DEXTROSE 1-5 GM/200ML-% IV SOLN
1000.0000 mg | Freq: Once | INTRAVENOUS | Status: DC
  Filled 2023-06-11: qty 200

## 2023-06-11 MED ORDER — SODIUM CHLORIDE 0.9 % IV BOLUS (SEPSIS)
1000.0000 mL | Freq: Once | INTRAVENOUS | Status: AC
  Administered 2023-06-11: 1000 mL via INTRAVENOUS

## 2023-06-11 MED ORDER — SODIUM CHLORIDE 0.9 % IV SOLN
2.0000 g | Freq: Once | INTRAVENOUS | Status: AC
  Administered 2023-06-11: 2 g via INTRAVENOUS
  Filled 2023-06-11: qty 10

## 2023-06-11 MED ORDER — METRONIDAZOLE 500 MG/100ML IV SOLN
500.0000 mg | Freq: Once | INTRAVENOUS | Status: AC
Start: 2023-06-11 — End: 2023-06-12
  Administered 2023-06-11: 500 mg via INTRAVENOUS
  Filled 2023-06-11: qty 100

## 2023-06-11 MED ORDER — MORPHINE SULFATE (PF) 2 MG/ML IV SOLN
2.0000 mg | Freq: Once | INTRAVENOUS | Status: AC
  Administered 2023-06-11: 2 mg via INTRAVENOUS
  Filled 2023-06-11: qty 1

## 2023-06-11 NOTE — ED Triage Notes (Addendum)
Patient to ED via POV for abscess to right side of jaw- inside of mouth and outside of mouth. Reports ongoing problem since April. Puss noted on outside and inside. Admitted in Aug for same.   1 set of blood set sent to lab at this time.

## 2023-06-11 NOTE — Sepsis Progress Note (Signed)
Elink monitoring for the code sepsis protocol.  

## 2023-06-11 NOTE — ED Provider Notes (Signed)
[facility] Provider Note    Event Date/Time   First MD Initiated Contact with Patient 06/11/23 2317     (approximate)   History   Abscess   HPI  Amber [name] is a 89 y.o. female who presents to the ED from home with a chief complaint of right jaw swelling and abscess.  Patient reports ongoing problems with dental abscess since April of this year.  Had an external abscess which burst a day ago and is draining purulent material.  She was admitted in August of this year for the same.  Endorses pain, redness and swelling.  Denies fever/chills, chest pain, shortness of breath, abdominal pain, nausea, vomiting or dizziness.     Past Medical History   Past Medical History:  Diagnosis Date   Acute metabolic encephalopathy 06/20/2022   Anemia    Anxiety    Arthritis    Benign neoplasm of colon    CHF (congestive heart failure) (HCC)    Chronic kidney disease    Chronic pain    Complication of anesthesia 1980's   hard time waking up   Coronary artery disease with unspecified angina pectoris    Cough    Diabetes mellitus without complication (HCC)    Essential hypertension    GERD (gastroesophageal reflux disease)    High risk medication use    Hyperlipidemia    Hypoglycemia 06/20/2022   Myocardial injury 06/20/2022   Plantar fascial fibromatosis    Presence of permanent cardiac pacemaker    Shortness of breath dyspnea      Active Problem List   Patient Active Problem List   Diagnosis Date Noted   Facial cellulitis 02/06/2023   HLD (hyperlipidemia) 06/20/2022   Atrial fibrillation, chronic (HCC) 06/20/2022   AMS (altered mental status) 04/12/2021   Shortness of breath 04/11/2021   Altered mental status 04/11/2021   History of atrial fibrillation 02/25/2020   Pacemaker 02/25/2020   Acute on chronic diastolic CHF (congestive heart failure) (HCC) 02/25/2020   Complete heart block (HCC) 01/24/2020   Injury    S/P ORIF (open reduction  internal fixation) fracture    Hypotension due to hypovolemia    Closed displaced fracture of right tibial tuberosity    Pain    Syncope and collapse 12/06/2019   Status post total hip replacement, left 02/09/2018   Primary osteoarthritis of left hip 01/06/2018   Chronic pain syndrome 04/09/2017   Other specified health status 04/08/2017   Disorder of bone, unspecified 04/08/2017   Long term current use of opiate analgesic 04/08/2017   Chronic low back pain (Primary Area of Pain) (Bilateral) (L>R) 04/08/2017   Chronic pain of both lower extremities  (Tertiary Area of Pain) 04/08/2017   Chronic groin pain (Secondary Area of Pain) 04/08/2017   Chronic neck pain (Fourth Area of Pain) (Bilateral) (L>R) 04/08/2017   Chronic sacroiliac joint pain 04/08/2017   Other long term (current) drug therapy 04/08/2017   Syncope 02/09/2017   Asthmatic bronchitis 01/26/2017   Fall 10/06/2016   Anxiety 08/08/2016   Acute on chronic respiratory failure with hypoxia (HCC) 07/26/2016   Acute on chronic systolic CHF (congestive heart failure) (HCC) 07/26/2016   TIA (transient ischemic attack) 07/25/2015   Sick sinus syndrome (HCC) 03/14/2015   CAD (coronary artery disease) 02/23/2015   Chronic heart failure (HCC) 01/05/2015   Fracture dislocation of ankle 07/27/2014   IDDM (insulin dependent diabetes mellitus) 07/27/2014   Essential hypertension 07/27/2014   Multiple rib fractures  07/27/2014   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   Anemia 12/06/2013   Type 2 diabetes mellitus with diabetic polyneuropathy, with long-term current use of insulin (HCC) 12/06/2013     Past Surgical History   Past Surgical History:  Procedure Laterality Date   ABDOMINAL HYSTERECTOMY     BACK SURGERY  1960's   cage and screws in lower back   CATARACT EXTRACTION W/ INTRAOCULAR LENS  IMPLANT, BILATERAL Bilateral    CORONARY ARTERY BYPASS GRAFT  2008   triple   HARDWARE REMOVAL Right 12/08/2019   Procedure: HARDWARE  REMOVAL;  Surgeon: Kennedy Bucker, MD;  Location: [facility] OR[facility];  Service: Orthopedics;  Laterality: Right;   JOINT REPLACEMENT Right    hip and knee   ORIF ANKLE FRACTURE Right 07/27/2014   Procedure: OPEN REDUCTION INTERNAL FIXATION (ORIF) ANKLE FRACTURE;  Surgeon: Harvie Junior, MD;  Location: [facility] OR;  Service: Orthopedics;  Laterality: Right;   ORIF TIBIA FRACTURE Right 12/08/2019   Procedure: OPEN REDUCTION INTERNAL FIXATION (ORIF) TIBIA FRACTURE;  Surgeon: Kennedy Bucker, MD;  Location: [facility];  Service: Orthopedics;  Laterality: Right;   PACEMAKER INSERTION Left 03/14/2015   Procedure: INSERTION PACEMAKER;  Surgeon: Marcina Millard, MD;  Location: [facility];  Service: Cardiovascular;  Laterality: Left;   PACEMAKER LEADLESS INSERTION N/A 01/24/2020   Procedure: PACEMAKER LEADLESS INSERTION;  Surgeon: Marcina Millard, MD;  Location: [facility];  Service: Cardiovascular;  Laterality: N/A;   ROTATOR CUFF REPAIR Left    TOTAL HIP ARTHROPLASTY Right    TOTAL HIP ARTHROPLASTY Left 02/09/2018   Procedure: TOTAL HIP ARTHROPLASTY ANTERIOR APPROACH;  Surgeon: Kennedy Bucker, MD;  Location: [facility];  Service: Orthopedics;  Laterality: Left;   TOTAL KNEE ARTHROPLASTY Right    VEIN LIGATION AND STRIPPING       Home Medications   Prior to Admission medications   Medication Sig Start Date End Date Taking? Authorizing Provider  acetaminophen (TYLENOL) 325 MG tablet Take 2 tablets (650 mg total) by mouth every 6 (six) hours as needed for mild pain (or Fever >/= 101). 02/08/23   Djan, Scarlette Calico, MD  albuterol (PROVENTIL HFA;VENTOLIN HFA) 108 (90 Base) MCG/ACT inhaler Inhale 2 puffs into the lungs every 6 (six) hours as needed for wheezing or shortness of breath. 01/28/17   Alford Highland, MD  alendronate (FOSAMAX) 70 MG tablet Take 70 mg by mouth every Sunday.    [provider]  amoxicillin-clavulanate (AUGMENTIN) 875-125 MG tablet Take 1 tablet by mouth every 12 (twelve) hours. 02/08/23    Loyce Dys, MD  apixaban (ELIQUIS) 5 MG TABS tablet Take 5 mg by mouth 2 (two) times daily.    [provider]  calcium-vitamin D (OSCAL WITH D) 500-200 MG-UNIT tablet Take 1 tablet by mouth every other day.    [provider]  carvedilol (COREG) 6.25 MG tablet Take 1 tablet (6.25 mg total) by mouth 2 (two) times daily with a meal. 01/28/17   Renae Gloss, Richard, MD  empagliflozin (JARDIANCE) 10 MG TABS tablet Take 10 mg by mouth daily.    [provider]  furosemide (LASIX) 20 MG tablet Take 1 tablet (20 mg total) by mouth 2 (two) times daily. Patient taking differently: Take 20 mg by mouth daily. 02/27/20   Gertha Calkin, MD  gabapentin (NEURONTIN) 100 MG capsule Take 200 mg by mouth 3 (three) times daily.    [provider]  insulin glargine (LANTUS) 100 UNIT/ML injection Inject 0.05 mLs (5 Units total) into the  skin daily. Hold if blood sugar less than 120 or if not eating well. 06/23/22   Esaw Grandchild A, DO  ipratropium-albuterol (DUONEB) 0.5-2.5 (3) MG/3ML SOLN Take 3 mLs by nebulization 4 (four) times daily.    [provider]  Multiple Vitamins-Minerals (PRESERVISION AREDS 2) CAPS Take 1 capsule by mouth every other day.    [provider]  omeprazole (PRILOSEC) 40 MG capsule Take 40 mg by mouth daily.     [provider]  ondansetron (ZOFRAN) 4 MG tablet Take 1 tablet (4 mg total) by mouth every 6 (six) hours as needed for nausea. 02/08/23   Loyce Dys, MD  polyethylene glycol (MIRALAX / GLYCOLAX) 17 g packet Take 17 g by mouth daily as needed for mild constipation. 02/08/23   Loyce Dys, MD  pravastatin (PRAVACHOL) 40 MG tablet Take 40 mg by mouth at bedtime.    [provider]  sacubitril-valsartan Sherryll Burger) 24-26 MG Take 1 tablet by mouth twice daily 01/19/23   Delma Freeze, FNP     Allergies  Ambien [zolpidem tartrate], Penicillins, Iodine, Succinylcholine, Etodolac, and Nsaids   Family History    Family History  Problem Relation Age of Onset   Heart attack Mother    Heart disease Father    Alzheimer's disease Sister    Cervical cancer Sister    Heart failure Son      Physical Exam  Triage Vital Signs: ED Triage Vitals  Encounter Vitals Group     BP 06/11/23 1809 (!) 102/58     Systolic BP Percentile --      Diastolic BP Percentile --      Pulse Rate 06/11/23 1809 72     Resp 06/11/23 1809 18     Temp 06/11/23 1809 98.5 F (36.9 C)     Temp Source 06/11/23 1809 Oral     SpO2 06/11/23 1809 95 %     Weight 06/11/23 1807 153 lb (69.4 kg)     Height 06/11/23 1807 4\' 7"  (1.397 m)     Head Circumference --      Peak Flow --      Pain Score 06/11/23 1809 8     Pain Loc --      Pain Education --      Exclude from Growth Chart --     Updated Vital Signs: BP 119/79   Pulse (!) 57   Temp 98.2 F (36.8 C) (Oral)   Resp 17   Ht 4\' 7"  (1.397 m)   Wt 69.4 kg   SpO2 100%   BMI 35.56 kg/m    General: Awake, mild distress.  CV:  RRR.  Good peripheral perfusion.  Resp:  Normal effort.  CTAB. Abd:  No distention.  Other:  Right jaw swelling with 2x2cm opened, crusted and draining area     ED Results / Procedures / Treatments  Labs (all labs ordered are listed, but only abnormal results are displayed) Labs Reviewed  COMPREHENSIVE METABOLIC PANEL - Abnormal; Notable for the following components:      Result Value   Glucose, Bld 189 (*)    Albumin 3.3 (*)    All other components within normal limits  LACTIC ACID, PLASMA - Abnormal; Notable for the following components:   Lactic Acid, Venous 2.4 (*)    All other components within normal limits  CBC WITH DIFFERENTIAL/PLATELET - Abnormal; Notable for the following components:   Hemoglobin 11.0 (*)    HCT 35.4 (*)  RDW 16.7 (*)    Platelets 431 (*)    All other components within normal limits  PROTIME-INR - Abnormal; Notable for the following components:   Prothrombin Time 17.1 (*)    INR 1.4 (*)    All  other components within normal limits  URINALYSIS, W/ REFLEX TO CULTURE (INFECTION SUSPECTED) - Abnormal; Notable for the following components:   Color, Urine YELLOW (*)    APPearance CLEAR (*)    Specific Gravity, Urine 1.035 (*)    Glucose, UA >=500 (*)    All other components within normal limits  CULTURE, BLOOD (ROUTINE X 2)  CULTURE, BLOOD (ROUTINE X 2)  LACTIC ACID, PLASMA     EKG  None   RADIOLOGY I have independently visualized and interpreted patient's imaging studies as well as noted the radiology interpretation:  Chest x-ray: No acute cardiopulmonary process  CT maxillofacial: Unchanged appearance of chronic right tubular osteomyelitis, overlying soft tissue swelling and edema without discrete fluid collection  Official radiology report(s): CT MAXILLOFACIAL WO CONTRAST  Result Date: 06/12/2023 CLINICAL DATA:  Right facial swelling EXAM: CT MAXILLOFACIAL WITHOUT CONTRAST TECHNIQUE: Multidetector CT imaging of the maxillofacial structures was performed. Multiplanar CT image reconstructions were also generated. RADIATION DOSE REDUCTION: This exam was performed according to the departmental dose-optimization program which includes automated exposure control, adjustment of the mA and/or kV according to patient size and/or use of iterative reconstruction technique. COMPARISON:  02/06/2023 FINDINGS: Osseous/dental: Redemonstration of area of confluent periapical lucency within the right mandibular body, involving the sockets of most of the absent right mandibular teeth. There is overlying soft tissue swelling without discrete fluid collection. There is edema of the soft tissues of the retromolar trigone. Orbits: Negative. No traumatic or inflammatory finding. Sinuses: Clear Soft tissues: Right facial soft tissue edema as above. Limited intracranial: Negative IMPRESSION: Unchanged appearance of chronic right mandibular osteomyelitis. Overlying soft tissue swelling and retromolar  trigone edema without discrete fluid collection. Electronically Signed   By: Deatra Robinson M.D.   On: 06/12/2023 00:26   DG Chest 2 View  Result Date: 06/11/2023 CLINICAL DATA:  Suspected sepsis.  Tooth infection since April. EXAM: CHEST - 2 VIEW COMPARISON:  06/20/2022 FINDINGS: Postoperative changes in the mediastinum, lumbar spine, and left shoulder. A loop recorder is present. Cardiac pacemaker. Mild cardiac enlargement. No airspace disease or consolidation in the lungs. No pleural effusions. No pneumothorax. Mediastinal contours appear intact. Calcification of the aorta. Old bilateral rib fractures. Degenerative changes in the spine and right shoulder. IMPRESSION: Cardiac enlargement.  No evidence of active pulmonary disease. Electronically Signed   By: Burman Nieves M.D.   On: 06/11/2023 20:18     PROCEDURES:  Critical Care performed: Yes, see critical care procedure note(s)  CRITICAL CARE Performed by: Irean Hong   Total critical care time: 30 minutes  Critical care time was exclusive of separately billable procedures and treating other patients.  Critical care was necessary to treat or prevent imminent or life-threatening deterioration.  Critical care was time spent personally by me on the following activities: development of treatment plan with patient and/or surrogate as well as nursing, discussions with consultants, evaluation of patient's response to treatment, examination of patient, obtaining history from patient or surrogate, ordering and performing treatments and interventions, ordering and review of laboratory studies, ordering and review of radiographic studies, pulse oximetry and re-evaluation of patient's condition.   Marland Kitchen1-3 Lead EKG Interpretation  Performed by: Irean Hong, MD Authorized by: Irean Hong, MD  Interpretation: normal     ECG rate:  80   ECG rate assessment: normal     Rhythm: sinus rhythm     Ectopy: none     Conduction: normal   Comments:      Patient placed on cardiac monitor to evaluate for arrhythmias    MEDICATIONS ORDERED IN ED: Medications  sodium chloride 0.9 % bolus 1,000 mL (1,000 mLs Intravenous New Bag/Given 06/11/23 2345)    And  sodium chloride 0.9 % bolus 1,000 mL (1,000 mLs Intravenous New Bag/Given 06/11/23 2353)    And  sodium chloride 0.9 % bolus 250 mL (has no administration in time range)  metroNIDAZOLE (FLAGYL) IVPB 500 mg (500 mg Intravenous New Bag/Given 06/11/23 2350)  vancomycin (VANCOCIN) IVPB 1000 mg/200 mL premix (has no administration in time range)    Followed by  vancomycin (VANCOREADY) IVPB 500 mg/100 mL (has no administration in time range)  aztreonam (AZACTAM) 2 g in sodium chloride 0.9 % 100 mL IVPB (2 g Intravenous New Bag/Given 06/11/23 2348)  morphine (PF) 2 MG/ML injection 2 mg (2 mg Intravenous Given 06/11/23 2357)  ondansetron (ZOFRAN) injection 4 mg (4 mg Intravenous Given 06/11/23 2357)     IMPRESSION / MDM / ASSESSMENT AND PLAN / ED COURSE  I reviewed the triage vital signs and the nursing notes.                             89 year old female presenting with right jaw swelling and purulent drainage.  Differential diagnosis includes but is not limited to abscess, cellulitis, osteomyelitis, etc.  I personally reviewed patient's records and note a cardiology office visit from 04/22/2023 for CAD.  Patient's presentation is most consistent with acute illness / injury with system symptoms.  The patient is on the cardiac monitor to evaluate for evidence of arrhythmia and/or significant heart rate changes.  Laboratory results demonstrate normal WBC 6.7, unremarkable electrolytes, initial lactic acid is 2.4.  ED code sepsis initiated with 30 cc/kilo IV fluids and broad-spectrum IV antibiotics.  Morphine administered for pain.  Obtain CT maxillofacial.  Anticipate admission.  Clinical Course as of 06/12/23 0040  Fri Jun 12, 2023  0981 Updated patient and family member on CT scan results.   Will consult hospitalist services for evaluation and admission. [JS]    Clinical Course User Index [JS] Irean Hong, MD     FINAL CLINICAL IMPRESSION(S) / ED DIAGNOSES   Final diagnoses:  Cellulitis of face  Chronic osteomyelitis (HCC)  Sepsis, due to unspecified organism, unspecified whether acute organ dysfunction present [facility])     Rx / DC Orders   ED Discharge Orders     None        Note:  This document was prepared using Dragon voice recognition software and may include unintentional dictation errors.   Irean Hong, MD 06/12/23 [phone]

## 2023-06-12 ENCOUNTER — Other Ambulatory Visit: Payer: Self-pay

## 2023-06-12 ENCOUNTER — Other Ambulatory Visit: Payer: Self-pay | Admitting: Infectious Diseases

## 2023-06-12 DIAGNOSIS — I130 Hypertensive heart and chronic kidney disease with heart failure and stage 1 through stage 4 chronic kidney disease, or unspecified chronic kidney disease: Secondary | ICD-10-CM | POA: Diagnosis present

## 2023-06-12 DIAGNOSIS — Z8249 Family history of ischemic heart disease and other diseases of the circulatory system: Secondary | ICD-10-CM | POA: Diagnosis not present

## 2023-06-12 DIAGNOSIS — E66812 Obesity, class 2: Secondary | ICD-10-CM | POA: Diagnosis present

## 2023-06-12 DIAGNOSIS — Z6835 Body mass index (BMI) 35.0-35.9, adult: Secondary | ICD-10-CM | POA: Diagnosis not present

## 2023-06-12 DIAGNOSIS — J9611 Chronic respiratory failure with hypoxia: Secondary | ICD-10-CM | POA: Diagnosis present

## 2023-06-12 DIAGNOSIS — Z951 Presence of aortocoronary bypass graft: Secondary | ICD-10-CM | POA: Diagnosis not present

## 2023-06-12 DIAGNOSIS — E1169 Type 2 diabetes mellitus with other specified complication: Secondary | ICD-10-CM | POA: Diagnosis present

## 2023-06-12 DIAGNOSIS — E1142 Type 2 diabetes mellitus with diabetic polyneuropathy: Secondary | ICD-10-CM | POA: Diagnosis present

## 2023-06-12 DIAGNOSIS — Z7984 Long term (current) use of oral hypoglycemic drugs: Secondary | ICD-10-CM | POA: Diagnosis not present

## 2023-06-12 DIAGNOSIS — J449 Chronic obstructive pulmonary disease, unspecified: Secondary | ICD-10-CM | POA: Diagnosis present

## 2023-06-12 DIAGNOSIS — L03211 Cellulitis of face: Secondary | ICD-10-CM

## 2023-06-12 DIAGNOSIS — I5022 Chronic systolic (congestive) heart failure: Secondary | ICD-10-CM | POA: Diagnosis present

## 2023-06-12 DIAGNOSIS — N189 Chronic kidney disease, unspecified: Secondary | ICD-10-CM | POA: Diagnosis present

## 2023-06-12 DIAGNOSIS — Z794 Long term (current) use of insulin: Secondary | ICD-10-CM | POA: Diagnosis not present

## 2023-06-12 DIAGNOSIS — Z7901 Long term (current) use of anticoagulants: Secondary | ICD-10-CM | POA: Diagnosis not present

## 2023-06-12 DIAGNOSIS — G4733 Obstructive sleep apnea (adult) (pediatric): Secondary | ICD-10-CM | POA: Diagnosis present

## 2023-06-12 DIAGNOSIS — I2510 Atherosclerotic heart disease of native coronary artery without angina pectoris: Secondary | ICD-10-CM | POA: Diagnosis present

## 2023-06-12 DIAGNOSIS — Z950 Presence of cardiac pacemaker: Secondary | ICD-10-CM | POA: Diagnosis not present

## 2023-06-12 DIAGNOSIS — Z96651 Presence of right artificial knee joint: Secondary | ICD-10-CM | POA: Diagnosis present

## 2023-06-12 DIAGNOSIS — E785 Hyperlipidemia, unspecified: Secondary | ICD-10-CM | POA: Diagnosis present

## 2023-06-12 DIAGNOSIS — Z9981 Dependence on supplemental oxygen: Secondary | ICD-10-CM | POA: Diagnosis not present

## 2023-06-12 DIAGNOSIS — I480 Paroxysmal atrial fibrillation: Secondary | ICD-10-CM | POA: Diagnosis present

## 2023-06-12 DIAGNOSIS — E1122 Type 2 diabetes mellitus with diabetic chronic kidney disease: Secondary | ICD-10-CM | POA: Diagnosis present

## 2023-06-12 DIAGNOSIS — M272 Inflammatory conditions of jaws: Secondary | ICD-10-CM | POA: Diagnosis present

## 2023-06-12 LAB — COMPREHENSIVE METABOLIC PANEL
ALT: 10 U/L (ref 0–44)
AST: 13 U/L — ABNORMAL LOW (ref 15–41)
Albumin: 2.7 g/dL — ABNORMAL LOW (ref 3.5–5.0)
Alkaline Phosphatase: 54 U/L (ref 38–126)
Anion gap: 9 (ref 5–15)
BUN: 15 mg/dL (ref 8–23)
CO2: 24 mmol/L (ref 22–32)
Calcium: 8.6 mg/dL — ABNORMAL LOW (ref 8.9–10.3)
Chloride: 105 mmol/L (ref 98–111)
Creatinine, Ser: 0.8 mg/dL (ref 0.44–1.00)
GFR, Estimated: >60 mL/min (ref >60)
Glucose, Bld: 136 mg/dL — ABNORMAL HIGH (ref 70–99)
Potassium: 3.6 mmol/L (ref 3.5–5.1)
Sodium: 138 mmol/L (ref 135–145)
Total Bilirubin: 0.5 mg/dL (ref <1.2)
Total Protein: 6.4 g/dL — ABNORMAL LOW (ref 6.5–8.1)

## 2023-06-12 LAB — CBC WITH DIFFERENTIAL/PLATELET
Abs Immature Granulocytes: 0.03 10*3/uL (ref 0.00–0.07)
Basophils Absolute: 0.1 10*3/uL (ref 0.0–0.1)
Basophils Relative: 1 %
Eosinophils Absolute: 0.3 10*3/uL (ref 0.0–0.5)
Eosinophils Relative: 5 %
HCT: 30.4 % — ABNORMAL LOW (ref 36.0–46.0)
Hemoglobin: 9.3 g/dL — ABNORMAL LOW (ref 12.0–15.0)
Immature Granulocytes: 0 %
Lymphocytes Relative: 14 %
Lymphs Abs: 1 10*3/uL (ref 0.7–4.0)
MCH: 26.7 pg (ref 26.0–34.0)
MCHC: 30.6 g/dL (ref 30.0–36.0)
MCV: 87.4 fL (ref 80.0–100.0)
Monocytes Absolute: 0.8 10*3/uL (ref 0.1–1.0)
Monocytes Relative: 12 %
Neutro Abs: 4.6 10*3/uL (ref 1.7–7.7)
Neutrophils Relative %: 68 %
Platelets: 344 10*3/uL (ref 150–400)
RBC: 3.48 MIL/uL — ABNORMAL LOW (ref 3.87–5.11)
RDW: 16.7 % — ABNORMAL HIGH (ref 11.5–15.5)
WBC: 6.8 10*3/uL (ref 4.0–10.5)
nRBC: 0 % (ref 0.0–0.2)

## 2023-06-12 LAB — CBG MONITORING, ED
Glucose-Capillary: 138 mg/dL — ABNORMAL HIGH (ref 70–99)
Glucose-Capillary: 107 mg/dL — ABNORMAL HIGH (ref 70–99)
Glucose-Capillary: 135 mg/dL — ABNORMAL HIGH (ref 70–99)
Glucose-Capillary: 118 mg/dL — ABNORMAL HIGH (ref 70–99)

## 2023-06-12 LAB — MAGNESIUM: Magnesium: 1.8 mg/dL (ref 1.7–2.4)

## 2023-06-12 LAB — HEMOGLOBIN A1C
Hgb A1c MFr Bld: 8 % — ABNORMAL HIGH (ref 4.8–5.6)
Mean Plasma Glucose: 182.9 mg/dL

## 2023-06-12 MED ORDER — PANTOPRAZOLE SODIUM 40 MG PO TBEC
40.0000 mg | DELAYED_RELEASE_TABLET | Freq: Every day | ORAL | Status: DC
  Administered 2023-06-12 – 2023-06-14 (×3): 40 mg via ORAL
  Filled 2023-06-12 (×2): qty 1

## 2023-06-12 MED ORDER — ALBUTEROL SULFATE (2.5 MG/3ML) 0.083% IN NEBU: 3.0000 mL | INHALATION_SOLUTION | Freq: Four times a day (QID) | RESPIRATORY_TRACT | Status: DC | PRN

## 2023-06-12 MED ORDER — SODIUM CHLORIDE 0.9% FLUSH
3.0000 mL | Freq: Two times a day (BID) | INTRAVENOUS | Status: DC
  Administered 2023-06-12 – 2023-06-14 (×4): 3 mL via INTRAVENOUS

## 2023-06-12 MED ORDER — ACETAMINOPHEN 650 MG RE SUPP: 650.0000 mg | Freq: Four times a day (QID) | RECTAL | Status: DC | PRN

## 2023-06-12 MED ORDER — SODIUM CHLORIDE 0.9 % IV SOLN: 2.0000 g | Freq: Two times a day (BID) | INTRAVENOUS | Status: DC

## 2023-06-12 MED ORDER — MORPHINE SULFATE (PF) 2 MG/ML IV SOLN: 2.0000 mg | INTRAVENOUS | Status: DC | PRN

## 2023-06-12 MED ORDER — SODIUM CHLORIDE 0.9 % IV SOLN
2.0000 g | Freq: Two times a day (BID) | INTRAVENOUS | Status: DC
  Administered 2023-06-12: 2 g via INTRAVENOUS
  Filled 2023-06-12: qty 12.5

## 2023-06-12 MED ORDER — VANCOMYCIN HCL IN DEXTROSE 1-5 GM/200ML-% IV SOLN
1000.0000 mg | Freq: Once | INTRAVENOUS | Status: AC
  Administered 2023-06-12: 1000 mg via INTRAVENOUS

## 2023-06-12 MED ORDER — HYDROCODONE-ACETAMINOPHEN 5-325 MG PO TABS
1.0000 | ORAL_TABLET | ORAL | Status: DC | PRN
  Administered 2023-06-13: 1 via ORAL
  Filled 2023-06-12: qty 1

## 2023-06-12 MED ORDER — APIXABAN 5 MG PO TABS
5.0000 mg | ORAL_TABLET | Freq: Two times a day (BID) | ORAL | Status: DC
  Administered 2023-06-12 – 2023-06-14 (×4): 5 mg via ORAL
  Filled 2023-06-12 (×4): qty 1

## 2023-06-12 MED ORDER — PROSIGHT PO TABS
1.0000 | ORAL_TABLET | ORAL | Status: DC
  Administered 2023-06-13: 1 via ORAL
  Filled 2023-06-12: qty 1

## 2023-06-12 MED ORDER — VANCOMYCIN HCL 500 MG/100ML IV SOLN
500.0000 mg | Freq: Once | INTRAVENOUS | Status: AC
Start: 2023-06-12 — End: 2023-06-12
  Administered 2023-06-12: 500 mg via INTRAVENOUS
  Filled 2023-06-12: qty 100

## 2023-06-12 MED ORDER — PRAVASTATIN SODIUM 20 MG PO TABS
40.0000 mg | ORAL_TABLET | Freq: Every day | ORAL | Status: DC
  Administered 2023-06-12 – 2023-06-13 (×2): 40 mg via ORAL
  Filled 2023-06-12 (×2): qty 2

## 2023-06-12 MED ORDER — INSULIN ASPART 100 UNIT/ML IJ SOLN
0.0000 [IU] | Freq: Three times a day (TID) | INTRAMUSCULAR | Status: DC
  Administered 2023-06-12 (×2): 2 [IU] via SUBCUTANEOUS
  Administered 2023-06-13 – 2023-06-14 (×3): 3 [IU] via SUBCUTANEOUS
  Filled 2023-06-12 (×5): qty 1

## 2023-06-12 MED ORDER — SODIUM CHLORIDE 0.9 % IV SOLN
3.0000 g | Freq: Four times a day (QID) | INTRAVENOUS | Status: DC
  Administered 2023-06-12 – 2023-06-14 (×8): 3 g via INTRAVENOUS
  Filled 2023-06-12 (×9): qty 8

## 2023-06-12 MED ORDER — AMOXICILLIN-POT CLAVULANATE ER 1000-62.5 MG PO TB12
2.0000 | ORAL_TABLET | Freq: Two times a day (BID) | ORAL | 0 refills | Status: DC
  Filled 2023-06-12: qty 84, 21d supply, fill #0

## 2023-06-12 MED ORDER — GABAPENTIN 100 MG PO CAPS
200.0000 mg | ORAL_CAPSULE | Freq: Three times a day (TID) | ORAL | Status: DC
  Administered 2023-06-12 – 2023-06-14 (×6): 200 mg via ORAL
  Filled 2023-06-12 (×6): qty 2

## 2023-06-12 MED ORDER — IPRATROPIUM-ALBUTEROL 0.5-2.5 (3) MG/3ML IN SOLN
3.0000 mL | Freq: Four times a day (QID) | RESPIRATORY_TRACT | Status: DC
  Administered 2023-06-12 – 2023-06-14 (×7): 3 mL via RESPIRATORY_TRACT
  Filled 2023-06-12 (×9): qty 3

## 2023-06-12 MED ORDER — ACETAMINOPHEN 325 MG PO TABS: 650.0000 mg | ORAL_TABLET | Freq: Four times a day (QID) | ORAL | Status: DC | PRN

## 2023-06-12 MED ORDER — METRONIDAZOLE 500 MG/100ML IV SOLN
500.0000 mg | Freq: Two times a day (BID) | INTRAVENOUS | Status: DC
  Administered 2023-06-12: 500 mg via INTRAVENOUS
  Filled 2023-06-12: qty 100

## 2023-06-12 MED ORDER — EZETIMIBE 10 MG PO TABS
10.0000 mg | ORAL_TABLET | Freq: Every day | ORAL | Status: DC
  Administered 2023-06-12 – 2023-06-14 (×3): 10 mg via ORAL
  Filled 2023-06-12 (×3): qty 1

## 2023-06-12 MED ORDER — VANCOMYCIN HCL IN DEXTROSE 1-5 GM/200ML-% IV SOLN
1000.0000 mg | INTRAVENOUS | Status: DC
Start: 2023-06-14 — End: 2023-06-12

## 2023-06-12 MED ORDER — POLYETHYLENE GLYCOL 3350 17 G PO PACK
17.0000 g | PACK | Freq: Every day | ORAL | Status: DC | PRN
  Filled 2023-06-12: qty 1

## 2023-06-12 MED ORDER — CARVEDILOL 6.25 MG PO TABS
6.2500 mg | ORAL_TABLET | Freq: Two times a day (BID) | ORAL | Status: DC
  Administered 2023-06-12 – 2023-06-14 (×3): 6.25 mg via ORAL
  Filled 2023-06-12 (×3): qty 1

## 2023-06-12 MED ORDER — VANCOMYCIN HCL 10 G IV SOLR: 1500.0000 mg | INTRAVENOUS | Status: DC

## 2023-06-12 NOTE — Progress Notes (Addendum)
Pharmacy Antibiotic Note  Amber [name] is a 89 y.o. female admitted on 06/11/2023 with cellulitis.  Pharmacy has been consulted for Vancomycin, cefepime dosing.  Aztreonam d/c'd , cefepime consult started b/c pt had received Augmentin and cephalexin previously without any apparent issue.   Plan: Cefepime 2 gm IV Q12H ordered to start on 12/6 @ 0800.  Vancomycin 1500 mg IV X 1 given in ED on 12/6 @ 0055. Vancomycin 1500 mg IV Q48H ordered to start on 12/8 @ 0100.  AUC = 476.1 Vanc trough = 10.6  Height: 4\' 7"  (139.7 cm) Weight: 69.4 kg (153 lb) IBW/kg (Calculated) : 34  Temp (24hrs), Avg:98.3 F (36.8 C), Min:98.1 F (36.7 C), Max:98.5 F (36.9 C)  Recent Labs  Lab 06/11/23 1812 06/11/23 1813 06/11/23 2309  WBC  --  6.7  --   CREATININE  --  0.66  --   LATICACIDVEN 2.4*  --  1.5    Estimated Creatinine Clearance: 35.6 mL/min (by C-G formula based on SCr of 0.66 mg/dL).    Allergies  Allergen Reactions   Ambien [Zolpidem Tartrate] Other (See Comments)    Reaction:  Keeps pt awake    Penicillins Itching and Other (See Comments)    Has patient had a PCN reaction causing immediate rash, facial/tongue/throat swelling, SOB or lightheadedness with hypotension: No Has patient had a PCN reaction causing severe rash involving mucus membranes or skin necrosis: No Has patient had a PCN reaction that required hospitalization No Has patient had a PCN reaction occurring within the last 10 years: No If all of the above answers are "NO", then may proceed with Cephalosporin use.   Iodine Itching   Succinylcholine Other (See Comments)    Reaction:  Unknown    Etodolac Other (See Comments)    GI upset   Nsaids Other (See Comments)    Reaction: gi upset    Antimicrobials this admission:   >>    >>   Dose adjustments this admission:   Microbiology results:  BCx:   UCx:    Sputum:    MRSA PCR:   Thank you for allowing pharmacy to be a part of this patient's  care.  Xzaiver Vayda D 06/12/2023 4:55 AM

## 2023-06-12 NOTE — ED Notes (Signed)
Patient resting comfortably at this time with eyes closed. Respirations even and unlabored. NAD. Family at bedside.

## 2023-06-12 NOTE — ED Notes (Signed)
Bladder scan 43mL

## 2023-06-12 NOTE — Progress Notes (Signed)
CODE SEPSIS - PHARMACY COMMUNICATION  **Broad Spectrum Antibiotics should be administered within 1 hour of Sepsis diagnosis**  Time Code Sepsis Called/Page Received: 12/5 @ 2334  Antibiotics Ordered: Aztreonam, Vancomycin, metronidazole   Time of 1st antibiotic administration: Aztreonam 2 gm IV X 1 on 12/5 @ 2348  Additional action taken by pharmacy:   If necessary, Name of Provider/Nurse Contacted:     Zamari Vea D ,PharmD Clinical Pharmacist  06/12/2023  12:10 AM

## 2023-06-12 NOTE — ED Provider Notes (Incomplete)
[facility] Provider Note    Event Date/Time   First MD Initiated Contact with Patient 06/11/23 2317     (approximate)   History   Abscess   HPI  Amber [name] is a 89 y.o. female who presents to the ED from home with a chief complaint of right jaw swelling and abscess.  Patient reports ongoing problems with dental abscess since April of this year.  Had an external abscess which burst a day ago and is draining purulent material.  She was admitted in August of this year for the same.  Endorses pain, redness and swelling.  Denies fever/chills, chest pain, shortness of breath, abdominal pain, nausea, vomiting or dizziness.     Past Medical History   Past Medical History:  Diagnosis Date  . Acute metabolic encephalopathy 06/20/2022  . Anemia   . Anxiety   . Arthritis   . Benign neoplasm of colon   . CHF (congestive heart failure) (HCC)   . Chronic kidney disease   . Chronic pain   . Complication of anesthesia 1980's   hard time waking up  . Coronary artery disease with unspecified angina pectoris   . Cough   . Diabetes mellitus without complication (HCC)   . Essential hypertension   . GERD (gastroesophageal reflux disease)   . High risk medication use   . Hyperlipidemia   . Hypoglycemia 06/20/2022  . Myocardial injury 06/20/2022  . Plantar fascial fibromatosis   . Presence of permanent cardiac pacemaker   . Shortness of breath dyspnea      Active Problem List   Patient Active Problem List   Diagnosis Date Noted  . Facial cellulitis 02/06/2023  . HLD (hyperlipidemia) 06/20/2022  . Atrial fibrillation, chronic (HCC) 06/20/2022  . AMS (altered mental status) 04/12/2021  . Shortness of breath 04/11/2021  . Altered mental status 04/11/2021  . History of atrial fibrillation 02/25/2020  . Pacemaker 02/25/2020  . Acute on chronic diastolic CHF (congestive heart failure) (HCC) 02/25/2020  . Complete heart block (HCC) 01/24/2020  .  Injury   . S/P ORIF (open reduction internal fixation) fracture   . Hypotension due to hypovolemia   . Closed displaced fracture of right tibial tuberosity   . Pain   . Syncope and collapse 12/06/2019  . Status post total hip replacement, left 02/09/2018  . Primary osteoarthritis of left hip 01/06/2018  . Chronic pain syndrome 04/09/2017  . Other specified health status 04/08/2017  . Disorder of bone, unspecified 04/08/2017  . Long term current use of opiate analgesic 04/08/2017  . Chronic low back pain (Primary Area of Pain) (Bilateral) (L>R) 04/08/2017  . Chronic pain of both lower extremities  (Tertiary Area of Pain) 04/08/2017  . Chronic groin pain (Secondary Area of Pain) 04/08/2017  . Chronic neck pain (Fourth Area of Pain) (Bilateral) (L>R) 04/08/2017  . Chronic sacroiliac joint pain 04/08/2017  . Other long term (current) drug therapy 04/08/2017  . Syncope 02/09/2017  . Asthmatic bronchitis 01/26/2017  . Fall 10/06/2016  . Anxiety 08/08/2016  . Acute on chronic respiratory failure with hypoxia (HCC) 07/26/2016  . Acute on chronic systolic CHF (congestive heart failure) (HCC) 07/26/2016  . TIA (transient ischemic attack) 07/25/2015  . Sick sinus syndrome (HCC) 03/14/2015  . CAD (coronary artery disease) 02/23/2015  . Chronic heart failure (HCC) 01/05/2015  . Fracture dislocation of ankle 07/27/2014  . IDDM (insulin dependent diabetes mellitus) 07/27/2014  . Essential hypertension 07/27/2014  . Multiple rib fractures  07/27/2014  . 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 Anemia 12/06/2013  . Type 2 diabetes mellitus with diabetic polyneuropathy, with long-term current use of insulin (HCC) 12/06/2013     Past Surgical History   Past Surgical History:  Procedure Laterality Date  . ABDOMINAL HYSTERECTOMY    . BACK SURGERY  1960's   cage and screws in lower back  . CATARACT EXTRACTION W/ INTRAOCULAR LENS  IMPLANT, BILATERAL Bilateral   . CORONARY ARTERY BYPASS GRAFT  2008    triple  . HARDWARE REMOVAL Right 12/08/2019   Procedure: HARDWARE REMOVAL;  Surgeon: Kennedy Bucker, MD;  Location: [facility];  Service: Orthopedics;  Laterality: Right;  . JOINT REPLACEMENT Right    hip and knee  . ORIF ANKLE FRACTURE Right 07/27/2014   Procedure: OPEN REDUCTION INTERNAL FIXATION (ORIF) ANKLE FRACTURE;  Surgeon: Harvie Junior, MD;  Location: [facility] OR;  Service: Orthopedics;  Laterality: Right;  . ORIF TIBIA FRACTURE Right 12/08/2019   Procedure: OPEN REDUCTION INTERNAL FIXATION (ORIF) TIBIA FRACTURE;  Surgeon: Kennedy Bucker, MD;  Location: [facility];  Service: Orthopedics;  Laterality: Right;  . PACEMAKER INSERTION Left 03/14/2015   Procedure: INSERTION PACEMAKER;  Surgeon: Marcina Millard, MD;  Location: [facility];  Service: Cardiovascular;  Laterality: Left;  . PACEMAKER LEADLESS INSERTION N/A 01/24/2020   Procedure: PACEMAKER LEADLESS INSERTION;  Surgeon: Marcina Millard, MD;  Location: [facility];  Service: Cardiovascular;  Laterality: N/A;  . ROTATOR CUFF REPAIR Left   . TOTAL HIP ARTHROPLASTY Right   . TOTAL HIP ARTHROPLASTY Left 02/09/2018   Procedure: TOTAL HIP ARTHROPLASTY ANTERIOR APPROACH;  Surgeon: Kennedy Bucker, MD;  Location: [facility];  Service: Orthopedics;  Laterality: Left;  . TOTAL KNEE ARTHROPLASTY Right   . VEIN LIGATION AND STRIPPING       Home Medications   Prior to Admission medications   Medication Sig Start Date End Date Taking? Authorizing Provider  acetaminophen (TYLENOL) 325 MG tablet Take 2 tablets (650 mg total) by mouth every 6 (six) hours as needed for mild pain (or Fever >/= 101). 02/08/23   Djan, Scarlette Calico, MD  albuterol (PROVENTIL HFA;VENTOLIN HFA) 108 (90 Base) MCG/ACT inhaler Inhale 2 puffs into the lungs every 6 (six) hours as needed for wheezing or shortness of breath. 01/28/17   Alford Highland, MD  alendronate (FOSAMAX) 70 MG tablet Take 70 mg by mouth every Sunday.    [provider]  amoxicillin-clavulanate  (AUGMENTIN) 875-125 MG tablet Take 1 tablet by mouth every 12 (twelve) hours. 02/08/23   Loyce Dys, MD  apixaban (ELIQUIS) 5 MG TABS tablet Take 5 mg by mouth 2 (two) times daily.    [provider]  calcium-vitamin D (OSCAL WITH D) 500-200 MG-UNIT tablet Take 1 tablet by mouth every other day.    [provider]  carvedilol (COREG) 6.25 MG tablet Take 1 tablet (6.25 mg total) by mouth 2 (two) times daily with a meal. 01/28/17   Renae Gloss, Richard, MD  empagliflozin (JARDIANCE) 10 MG TABS tablet Take 10 mg by mouth daily.    [provider]  furosemide (LASIX) 20 MG tablet Take 1 tablet (20 mg total) by mouth 2 (two) times daily. Patient taking differently: Take 20 mg by mouth daily. 02/27/20   Gertha Calkin, MD  gabapentin (NEURONTIN) 100 MG capsule Take 200 mg by mouth 3 (three) times daily.    [provider]  insulin glargine (LANTUS) 100 UNIT/ML injection Inject 0.05 mLs (5 Units total) into the  skin daily. Hold if blood sugar less than 120 or if not eating well. 06/23/22   Esaw Grandchild A, DO  ipratropium-albuterol (DUONEB) 0.5-2.5 (3) MG/3ML SOLN Take 3 mLs by nebulization 4 (four) times daily.    [provider]  Multiple Vitamins-Minerals (PRESERVISION AREDS 2) CAPS Take 1 capsule by mouth every other day.    [provider]  omeprazole (PRILOSEC) 40 MG capsule Take 40 mg by mouth daily.     [provider]  ondansetron (ZOFRAN) 4 MG tablet Take 1 tablet (4 mg total) by mouth every 6 (six) hours as needed for nausea. 02/08/23   Loyce Dys, MD  polyethylene glycol (MIRALAX / GLYCOLAX) 17 g packet Take 17 g by mouth daily as needed for mild constipation. 02/08/23   Loyce Dys, MD  pravastatin (PRAVACHOL) 40 MG tablet Take 40 mg by mouth at bedtime.    [provider]  sacubitril-valsartan Sherryll Burger) 24-26 MG Take 1 tablet by mouth twice daily 01/19/23   Delma Freeze, FNP     Allergies  Ambien [zolpidem  tartrate], Penicillins, Iodine, Succinylcholine, Etodolac, and Nsaids   Family History   Family History  Problem Relation Age of Onset  . Heart attack Mother   . Heart disease Father   . Alzheimer's disease Sister   . Cervical cancer Sister   . Heart failure Son      Physical Exam  Triage Vital Signs: ED Triage Vitals  Encounter Vitals Group     BP 06/11/23 1809 (!) 102/58     Systolic BP Percentile --      Diastolic BP Percentile --      Pulse Rate 06/11/23 1809 72     Resp 06/11/23 1809 18     Temp 06/11/23 1809 98.5 F (36.9 C)     Temp Source 06/11/23 1809 Oral     SpO2 06/11/23 1809 95 %     Weight 06/11/23 1807 153 lb (69.4 kg)     Height 06/11/23 1807 4\' 7"  (1.397 m)     Head Circumference --      Peak Flow --      Pain Score 06/11/23 1809 8     Pain Loc --      Pain Education --      Exclude from Growth Chart --     Updated Vital Signs: BP 119/79   Pulse 89   Temp 98.2 F (36.8 C) (Oral)   Resp 19   Ht 4\' 7"  (1.397 m)   Wt 69.4 kg   SpO2 100%   BMI 35.56 kg/m    General: Awake, mild distress.  CV:  RRR.  Good peripheral perfusion.  Resp:  Normal effort.  CTAB. Abd:  No distention.  Other:  Right jaw swelling with opened and draining area   ED Results / Procedures / Treatments  Labs (all labs ordered are listed, but only abnormal results are displayed) Labs Reviewed  COMPREHENSIVE METABOLIC PANEL - Abnormal; Notable for the following components:      Result Value   Glucose, Bld 189 (*)    Albumin 3.3 (*)    All other components within normal limits  LACTIC ACID, PLASMA - Abnormal; Notable for the following components:   Lactic Acid, Venous 2.4 (*)    All other components within normal limits  CBC WITH DIFFERENTIAL/PLATELET - Abnormal; Notable for the following components:   Hemoglobin 11.0 (*)    HCT 35.4 (*)    RDW 16.7 (*)  Platelets 431 (*)    All other components within normal limits  PROTIME-INR - Abnormal; Notable for the  following components:   Prothrombin Time 17.1 (*)    INR 1.4 (*)    All other components within normal limits  URINALYSIS, W/ REFLEX TO CULTURE (INFECTION SUSPECTED) - Abnormal; Notable for the following components:   Color, Urine YELLOW (*)    APPearance CLEAR (*)    Specific Gravity, Urine 1.035 (*)    Glucose, UA >=500 (*)    All other components within normal limits  CULTURE, BLOOD (ROUTINE X 2)  CULTURE, BLOOD (ROUTINE X 2)  LACTIC ACID, PLASMA     EKG  None   RADIOLOGY I have independently visualized and interpreted patient's imaging studies as well as noted the radiology interpretation:  {You MUST document your own interpretation of imaging, as well as the fact that you reviewed the radiologist's report!:1}  Official radiology report(s): DG Chest 2 View  Result Date: 06/11/2023 CLINICAL DATA:  Suspected sepsis.  Tooth infection since April. EXAM: CHEST - 2 VIEW COMPARISON:  06/20/2022 FINDINGS: Postoperative changes in the mediastinum, lumbar spine, and left shoulder. A loop recorder is present. Cardiac pacemaker. Mild cardiac enlargement. No airspace disease or consolidation in the lungs. No pleural effusions. No pneumothorax. Mediastinal contours appear intact. Calcification of the aorta. Old bilateral rib fractures. Degenerative changes in the spine and right shoulder. IMPRESSION: Cardiac enlargement.  No evidence of active pulmonary disease. Electronically Signed   By: Burman Nieves M.D.   On: 06/11/2023 20:18     PROCEDURES:  Critical Care performed: {CriticalCareYesNo:19197::"Yes, see critical care procedure note(s)","No"}  .1-3 Lead EKG Interpretation  Performed by: Irean Hong, MD Authorized by: Irean Hong, MD     Interpretation: normal     ECG rate:  80   ECG rate assessment: normal     Rhythm: sinus rhythm     Ectopy: none     Conduction: normal   Comments:     Patient placed on cardiac monitor to evaluate for arrhythmias    MEDICATIONS  ORDERED IN ED: Medications  sodium chloride 0.9 % bolus 1,000 mL (1,000 mLs Intravenous New Bag/Given 06/11/23 2345)    And  sodium chloride 0.9 % bolus 1,000 mL (1,000 mLs Intravenous New Bag/Given 06/11/23 2353)    And  sodium chloride 0.9 % bolus 250 mL (has no administration in time range)  aztreonam (AZACTAM) 2 g in sodium chloride 0.9 % 100 mL IVPB (2 g Intravenous New Bag/Given 06/11/23 2348)  metroNIDAZOLE (FLAGYL) IVPB 500 mg (500 mg Intravenous New Bag/Given 06/11/23 2350)  vancomycin (VANCOCIN) IVPB 1000 mg/200 mL premix (has no administration in time range)  morphine (PF) 2 MG/ML injection 2 mg (2 mg Intravenous Given 06/11/23 2357)  ondansetron (ZOFRAN) injection 4 mg (4 mg Intravenous Given 06/11/23 2357)     IMPRESSION / MDM / ASSESSMENT AND PLAN / ED COURSE  I reviewed the triage vital signs and the nursing notes.                             89 year old female presenting with right jaw swelling and purulent drainage.  Differential diagnosis includes but is not limited to abscess, cellulitis, osteomyelitis, etc.  I personally reviewed patient's records and note a cardiology office visit from 04/22/2023 for CAD.  Patient's presentation is most consistent with acute illness / injury with system symptoms.  The patient is on the cardiac monitor  to evaluate for evidence of arrhythmia and/or significant heart rate changes.  Laboratory results demonstrate normal WBC 6.7, unremarkable electrolytes, initial lactic acid is 2.4.  ED code sepsis initiated with 30 cc/kilo IV fluids and broad-spectrum IV antibiotics.  Morphine administered for pain.  Obtain CT maxillofacial.  Anticipate admission.      FINAL CLINICAL IMPRESSION(S) / ED DIAGNOSES   Final diagnoses:  None     Rx / DC Orders   ED Discharge Orders     None        Note:  This document was prepared using Dragon voice recognition software and may include unintentional dictation errors.

## 2023-06-12 NOTE — H&P (Addendum)
History and Physical    Patient: Amber [name] YSA:[id number] DOB: 06-Feb-1930 DOA: 06/11/2023 DOS: the patient was seen and examined on 06/12/2023 PCP: Marguarite Arbour, MD  Patient coming from: Home  Chief Complaint:  Chief Complaint  Patient presents with   Abscess   HPI: Amber [name] is a 89 y.o. female with medical history significant for HFrEF with last EF of 25%, chronic hypoxic respiratory failure on 3 L, type 2 diabetes, complete heart block s/p PPM, CAD s/p CABG (2008), chronic anemia, hypertension, hyperlipidemia, chronic pain,  coming for facial cellulitis. Pt was admitted in August 2024 and was admitted for same.CT at that time of the maxillofacial region was obtained that demonstrated severe otogenic disease with multiple caries and large periapical lucency with cortical breakthrough from a right maxillary canine with extensive soft tissue stranding extending up to the TMJ and down into the submandibular region. Patient was managed with IV antibiotics with improvement of swelling and resolution of pain and therefore being discharged on oral antibiotics to continue following up with dentist.  Today pt comes to [place] with a draining open sore/ wound and drainage is yellowish pale thick with scant blood pain on expression of drainage from site. Pt has been dealing with this since April.  Denies fever/chills, chest pain, shortness of breath, abdominal pain, nausea, vomiting or dizziness.        In emergency room vitals trend shows: pt is in no distress and is oriented and baseline is mobile with walker.  Vitals:   06/12/23 0000 06/12/23 0130 06/12/23 0229 06/12/23 0230  BP:  (!) 142/50    Pulse: (!) 57     Temp:   98.1 F (36.7 C)   Resp:  19  14  Height:      Weight:      SpO2: 100%     TempSrc:   Axillary   BMI (Calculated):      Today ct scan shows: IMPRESSION: Unchanged appearance of chronic right mandibular osteomyelitis. Overlying soft tissue swelling and  retromolar trigone edema without discrete fluid collection.  CT is negative for any active pulmonary disease and does show cardiac enlargement. Labs are notable for : -CMP shows glucose 189 albumin 3.3 otherwise normal initial lactic of 2.4, anemia with a hemoglobin of 11 platelets of 431 PT/INR showing 17.1 and 1.4 INR glucose 189, wound culture collected in the emergency room.  In the ED pt received: Medications  vancomycin (VANCOCIN) IVPB 1000 mg/200 mL premix (1,000 mg Intravenous New Bag/Given 06/12/23 0055)    Followed by  vancomycin (VANCOREADY) IVPB 500 mg/100 mL (has no administration in time range)  sodium chloride 0.9 % bolus 1,000 mL (0 mLs Intravenous Stopped 06/12/23 0045)    And  sodium chloride 0.9 % bolus 1,000 mL (0 mLs Intravenous Stopped 06/12/23 0045)    And  sodium chloride 0.9 % bolus 250 mL (250 mLs Intravenous New Bag/Given 06/12/23 0053)  aztreonam (AZACTAM) 2 g in sodium chloride 0.9 % 100 mL IVPB (0 g Intravenous Stopped 06/12/23 0045)  metroNIDAZOLE (FLAGYL) IVPB 500 mg (0 mg Intravenous Stopped 06/12/23 0053)  morphine (PF) 2 MG/ML injection 2 mg (2 mg Intravenous Given 06/11/23 2357)  ondansetron (ZOFRAN) injection 4 mg (4 mg Intravenous Given 06/11/23 2357)   Review of Systems  Musculoskeletal:        Wound cultures collected.     Past Medical History:  Diagnosis Date   Acute metabolic encephalopathy 06/20/2022   Anemia  Anxiety    Arthritis    Benign neoplasm of colon    CHF (congestive heart failure) (HCC)    Chronic kidney disease    Chronic pain    Complication of anesthesia 1980's   hard time waking up   Coronary artery disease with unspecified angina pectoris    Cough    Diabetes mellitus without complication (HCC)    Essential hypertension    GERD (gastroesophageal reflux disease)    High risk medication use    Hyperlipidemia    Hypoglycemia 06/20/2022   Myocardial injury 06/20/2022   Plantar fascial fibromatosis    Presence of permanent  cardiac pacemaker    Shortness of breath dyspnea    Past Surgical History:  Procedure Laterality Date   ABDOMINAL HYSTERECTOMY     BACK SURGERY  1960's   cage and screws in lower back   CATARACT EXTRACTION W/ INTRAOCULAR LENS  IMPLANT, BILATERAL Bilateral    CORONARY ARTERY BYPASS GRAFT  2008   triple   HARDWARE REMOVAL Right 12/08/2019   Procedure: HARDWARE REMOVAL;  Surgeon: Kennedy Bucker, MD;  Location: [facility];  Service: Orthopedics;  Laterality: Right;   JOINT REPLACEMENT Right    hip and knee   ORIF ANKLE FRACTURE Right 07/27/2014   Procedure: OPEN REDUCTION INTERNAL FIXATION (ORIF) ANKLE FRACTURE;  Surgeon: Harvie Junior, MD;  Location: [facility];  Service: Orthopedics;  Laterality: Right;   ORIF TIBIA FRACTURE Right 12/08/2019   Procedure: OPEN REDUCTION INTERNAL FIXATION (ORIF) TIBIA FRACTURE;  Surgeon: Kennedy Bucker, MD;  Location: [facility];  Service: Orthopedics;  Laterality: Right;   PACEMAKER INSERTION Left 03/14/2015   Procedure: INSERTION PACEMAKER;  Surgeon: Marcina Millard, MD;  Location: [facility];  Service: Cardiovascular;  Laterality: Left;   PACEMAKER LEADLESS INSERTION N/A 01/24/2020   Procedure: PACEMAKER LEADLESS INSERTION;  Surgeon: Marcina Millard, MD;  Location: [facility];  Service: Cardiovascular;  Laterality: N/A;   ROTATOR CUFF REPAIR Left    TOTAL HIP ARTHROPLASTY Right    TOTAL HIP ARTHROPLASTY Left 02/09/2018   Procedure: TOTAL HIP ARTHROPLASTY ANTERIOR APPROACH;  Surgeon: Kennedy Bucker, MD;  Location: [facility];  Service: Orthopedics;  Laterality: Left;   TOTAL KNEE ARTHROPLASTY Right    VEIN LIGATION AND STRIPPING      reports that she has never smoked. She has never used smokeless tobacco. She reports that she does not drink alcohol and does not use drugs.  Allergies  Allergen Reactions   Ambien [Zolpidem Tartrate] Other (See Comments)    Reaction:  Keeps pt awake    Penicillins Itching and Other (See Comments)    Has patient had a PCN  reaction causing immediate rash, facial/tongue/throat swelling, SOB or lightheadedness with hypotension: No Has patient had a PCN reaction causing severe rash involving mucus membranes or skin necrosis: No Has patient had a PCN reaction that required hospitalization No Has patient had a PCN reaction occurring within the last 10 years: No If all of the above answers are "NO", then may proceed with Cephalosporin use.   Iodine Itching   Succinylcholine Other (See Comments)    Reaction:  Unknown    Etodolac Other (See Comments)    GI upset   Nsaids Other (See Comments)    Reaction: gi upset   Family History  Problem Relation Age of Onset   Heart attack Mother    Heart disease Father    Alzheimer's disease Sister    Cervical cancer Sister    Heart failure Son  Prior to Admission medications   Medication Sig Start Date End Date Taking? Authorizing Provider  acetaminophen (TYLENOL) 325 MG tablet Take 2 tablets (650 mg total) by mouth every 6 (six) hours as needed for mild pain (or Fever >/= 101). 02/08/23   Djan, Scarlette Calico, MD  albuterol (PROVENTIL HFA;VENTOLIN HFA) 108 (90 Base) MCG/ACT inhaler Inhale 2 puffs into the lungs every 6 (six) hours as needed for wheezing or shortness of breath. 01/28/17   Alford Highland, MD  alendronate (FOSAMAX) 70 MG tablet Take 70 mg by mouth every Sunday.    [provider]  amoxicillin-clavulanate (AUGMENTIN) 875-125 MG tablet Take 1 tablet by mouth every 12 (twelve) hours. 02/08/23   Loyce Dys, MD  apixaban (ELIQUIS) 5 MG TABS tablet Take 5 mg by mouth 2 (two) times daily.    [provider]  calcium-vitamin D (OSCAL WITH D) 500-200 MG-UNIT tablet Take 1 tablet by mouth every other day.    [provider]  carvedilol (COREG) 6.25 MG tablet Take 1 tablet (6.25 mg total) by mouth 2 (two) times daily with a meal. 01/28/17   Renae Gloss, Richard, MD  empagliflozin (JARDIANCE) 10 MG TABS tablet Take 10 mg by mouth daily.    [provider]  furosemide (LASIX) 20 MG tablet Take 1 tablet (20 mg total) by mouth 2 (two) times daily. Patient taking differently: Take 20 mg by mouth daily. 02/27/20   Gertha Calkin, MD  gabapentin (NEURONTIN) 100 MG capsule Take 200 mg by mouth 3 (three) times daily.    [provider]  insulin glargine (LANTUS) 100 UNIT/ML injection Inject 0.05 mLs (5 Units total) into the skin daily. Hold if blood sugar less than 120 or if not eating well. 06/23/22   Esaw Grandchild A, DO  ipratropium-albuterol (DUONEB) 0.5-2.5 (3) MG/3ML SOLN Take 3 mLs by nebulization 4 (four) times daily.    [provider]  Multiple Vitamins-Minerals (PRESERVISION AREDS 2) CAPS Take 1 capsule by mouth every other day.    [provider]  omeprazole (PRILOSEC) 40 MG capsule Take 40 mg by mouth daily.     [provider]  ondansetron (ZOFRAN) 4 MG tablet Take 1 tablet (4 mg total) by mouth every 6 (six) hours as needed for nausea. 02/08/23   Loyce Dys, MD  polyethylene glycol (MIRALAX / GLYCOLAX) 17 g packet Take 17 g by mouth daily as needed for mild constipation. 02/08/23   Loyce Dys, MD  pravastatin (PRAVACHOL) 40 MG tablet Take 40 mg by mouth at bedtime.    [provider]  sacubitril-valsartan Sherryll Burger) 24-26 MG Take 1 tablet by mouth twice daily 01/19/23   Delma Freeze, FNP     Vitals:   06/12/23 0000 06/12/23 0130 06/12/23 0229 06/12/23 0230  BP:  (!) 142/50    Pulse: (!) 57     Resp:  19  14  Temp:   98.1 F (36.7 C)   TempSrc:   Axillary   SpO2: 100%     Weight:      Height:       Physical Exam Vitals and nursing note reviewed.  Constitutional:      General: She is not in acute distress. HENT:     Head: Normocephalic and atraumatic.     Right Ear: Hearing normal.     Left Ear: Hearing normal.     Nose: Nose normal. No nasal deformity.     Mouth/Throat:     Lips: Pink.  Tongue: No lesions.     Pharynx: Oropharynx is clear.  Eyes:      General: Lids are normal.     Extraocular Movements: Extraocular movements intact.  Neck:   Cardiovascular:     Rate and Rhythm: Normal rate and regular rhythm.     Heart sounds: Murmur heard.     Systolic murmur is present with a grade of 4/6.  Pulmonary:     Effort: Pulmonary effort is normal.     Breath sounds: Normal breath sounds.  Abdominal:     General: Bowel sounds are normal. There is no distension.     Palpations: Abdomen is soft. There is no mass.     Tenderness: There is no abdominal tenderness.  Musculoskeletal:     Right lower leg: No edema.     Left lower leg: No edema.  Skin:    General: Skin is warm.  Neurological:     General: No focal deficit present.     Mental Status: She is alert and oriented to person, place, and time.     Cranial Nerves: Cranial nerves 2-12 are intact.  Psychiatric:        Attention and Perception: Attention normal.        Mood and Affect: Mood normal.        Speech: Speech normal.        Behavior: Behavior normal. Behavior is cooperative.      Labs on Admission: I have personally reviewed following labs and imaging studies Results for orders placed or performed during the hospital encounter of 06/11/23 (from the past 24 hour(s))  Lactic acid, plasma     Status: Abnormal   Collection Time: 06/11/23  6:12 PM  Result Value Ref Range   Lactic Acid, Venous 2.4 (HH) 0.5 - 1.9 mmol/L  Comprehensive metabolic panel     Status: Abnormal   Collection Time: 06/11/23  6:13 PM  Result Value Ref Range   Sodium 136 135 - 145 mmol/L   Potassium 4.2 3.5 - 5.1 mmol/L   Chloride 99 98 - 111 mmol/L   CO2 25 22 - 32 mmol/L   Glucose, Bld 189 (H) 70 - 99 mg/dL   BUN 16 8 - 23 mg/dL   Creatinine, Ser 4.09 0.44 - 1.00 mg/dL   Calcium 9.3 8.9 - 81.1 mg/dL   Total Protein 7.7 6.5 - 8.1 g/dL   Albumin 3.3 (L) 3.5 - 5.0 g/dL   AST 15 15 - 41 U/L   ALT 11 0 - 44 U/L   Alkaline Phosphatase 63 38 - 126 U/L   Total Bilirubin 0.4 <1.2 mg/dL   GFR,  Estimated >91 >47 mL/min   Anion gap 12 5 - 15  CBC with Differential     Status: Abnormal   Collection Time: 06/11/23  6:13 PM  Result Value Ref Range   WBC 6.7 4.0 - 10.5 K/uL   RBC 4.15 3.87 - 5.11 MIL/uL   Hemoglobin 11.0 (L) 12.0 - 15.0 g/dL   HCT 82.9 (L) 56.2 - 13.0 %   MCV 85.3 80.0 - 100.0 fL   MCH 26.5 26.0 - 34.0 pg   MCHC 31.1 30.0 - 36.0 g/dL   RDW 86.5 (H) 78.4 - 69.6 %   Platelets 431 (H) 150 - 400 K/uL   nRBC 0.0 0.0 - 0.2 %   Neutrophils Relative % 68 %   Neutro Abs 4.6 1.7 - 7.7 K/uL   Lymphocytes Relative 14 %   Lymphs Abs 0.9  0.7 - 4.0 K/uL   Monocytes Relative 12 %   Monocytes Absolute 0.8 0.1 - 1.0 K/uL   Eosinophils Relative 4 %   Eosinophils Absolute 0.3 0.0 - 0.5 K/uL   Basophils Relative 1 %   Basophils Absolute 0.0 0.0 - 0.1 K/uL   Immature Granulocytes 1 %   Abs Immature Granulocytes 0.04 0.00 - 0.07 K/uL  Protime-INR     Status: Abnormal   Collection Time: 06/11/23  6:13 PM  Result Value Ref Range   Prothrombin Time 17.1 (H) 11.4 - 15.2 seconds   INR 1.4 (H) 0.8 - 1.2  Lactic acid, plasma     Status: None   Collection Time: 06/11/23 11:09 PM  Result Value Ref Range   Lactic Acid, Venous 1.5 0.5 - 1.9 mmol/L  Urinalysis, w/ Reflex to Culture (Infection Suspected) -Urine, Clean Catch     Status: Abnormal   Collection Time: 06/11/23 11:29 PM  Result Value Ref Range   Specimen Source URINE, CATHETERIZED    Color, Urine YELLOW (A) YELLOW   APPearance CLEAR (A) CLEAR   Specific Gravity, Urine 1.035 (H) 1.005 - 1.030   pH 5.0 5.0 - 8.0   Glucose, UA >=500 (A) NEGATIVE mg/dL   Hgb urine dipstick NEGATIVE NEGATIVE   Bilirubin Urine NEGATIVE NEGATIVE   Ketones, ur NEGATIVE NEGATIVE mg/dL   Protein, ur NEGATIVE NEGATIVE mg/dL   Nitrite NEGATIVE NEGATIVE   Leukocytes,Ua NEGATIVE NEGATIVE   RBC / HPF 0-5 0 - 5 RBC/hpf   WBC, UA 0-5 0 - 5 WBC/hpf   Bacteria, UA NONE SEEN NONE SEEN   Squamous Epithelial / HPF 0 0 - 5 /HPF   Hyaline Casts, UA  PRESENT     CBC: Recent Labs  Lab 06/11/23 1813  WBC 6.7  NEUTROABS 4.6  HGB 11.0*  HCT 35.4*  MCV 85.3  PLT 431*   Basic Metabolic Panel: Recent Labs  Lab 06/11/23 1813  NA 136  K 4.2  CL 99  CO2 25  GLUCOSE 189*  BUN 16  CREATININE 0.66  CALCIUM 9.3   GFR: Estimated Creatinine Clearance: 35.6 mL/min (by C-G formula based on SCr of 0.66 mg/dL). Liver Function Tests: Recent Labs  Lab 06/11/23 1813  AST 15  ALT 11  ALKPHOS 63  BILITOT 0.4  PROT 7.7  ALBUMIN 3.3*   No results for input(s): "LIPASE", "AMYLASE" in the last 168 hours. No results for input(s): "AMMONIA" in the last 168 hours. Coagulation Profile: Recent Labs  Lab 06/11/23 1813  INR 1.4*   Cardiac Enzymes: No results for input(s): "CKTOTAL", "CKMB", "CKMBINDEX", "TROPONINI" in the last 168 hours. BNP (last 3 results) No results for input(s): "PROBNP" in the last 8760 hours. HbA1C: No results for input(s): "HGBA1C" in the last 72 hours. CBG: No results for input(s): "GLUCAP" in the last 168 hours. Lipid Profile: No results for input(s): "CHOL", "HDL", "LDLCALC", "TRIG", "CHOLHDL", "LDLDIRECT" in the last 72 hours. Thyroid Function Tests: No results for input(s): "TSH", "T4TOTAL", "FREET4", "T3FREE", "THYROIDAB" in the last 72 hours. Anemia Panel: No results for input(s): "VITAMINB12", "FOLATE", "FERRITIN", "TIBC", "IRON", "RETICCTPCT" in the last 72 hours. Urinalysis    Component Value Date/Time   COLORURINE YELLOW (A) 06/11/2023 2329   APPEARANCEUR CLEAR (A) 06/11/2023 2329   APPEARANCEUR Hazy 11/25/2013 2129   LABSPEC 1.035 (H) 06/11/2023 2329   LABSPEC 1.019 11/25/2013 2129   PHURINE 5.0 06/11/2023 2329   GLUCOSEU >=500 (A) 06/11/2023 2329   GLUCOSEU Negative 11/25/2013 2129   HGBUR  NEGATIVE 06/11/2023 2329   BILIRUBINUR NEGATIVE 06/11/2023 2329   BILIRUBINUR Negative 11/25/2013 2129   KETONESUR NEGATIVE 06/11/2023 2329   PROTEINUR NEGATIVE 06/11/2023 2329   NITRITE NEGATIVE  06/11/2023 2329   LEUKOCYTESUR NEGATIVE 06/11/2023 2329   LEUKOCYTESUR Trace 11/25/2013 2129      Unresulted Labs (From admission, onward)     Start     Ordered   06/12/23 0212  Aerobic Culture w Gram Stain (superficial specimen)  Once,   URGENT       Comments: Hold for fungal culture   Question:  Patient immune status  Answer:  Normal   06/12/23 0214   06/11/23 1812  Culture, blood (Routine x 2)  BLOOD CULTURE X 2,   STAT      06/11/23 1811            Medications  vancomycin (VANCOCIN) IVPB 1000 mg/200 mL premix (1,000 mg Intravenous New Bag/Given 06/12/23 0055)    Followed by  vancomycin (VANCOREADY) IVPB 500 mg/100 mL (has no administration in time range)  sodium chloride 0.9 % bolus 1,000 mL (0 mLs Intravenous Stopped 06/12/23 0045)    And  sodium chloride 0.9 % bolus 1,000 mL (0 mLs Intravenous Stopped 06/12/23 0045)    And  sodium chloride 0.9 % bolus 250 mL (250 mLs Intravenous New Bag/Given 06/12/23 0053)  aztreonam (AZACTAM) 2 g in sodium chloride 0.9 % 100 mL IVPB (0 g Intravenous Stopped 06/12/23 0045)  metroNIDAZOLE (FLAGYL) IVPB 500 mg (0 mg Intravenous Stopped 06/12/23 0053)  morphine (PF) 2 MG/ML injection 2 mg (2 mg Intravenous Given 06/11/23 2357)  ondansetron (ZOFRAN) injection 4 mg (4 mg Intravenous Given 06/11/23 2357)    Radiological Exams on Admission: CT MAXILLOFACIAL WO CONTRAST  Result Date: 06/12/2023 CLINICAL DATA:  Right facial swelling EXAM: CT MAXILLOFACIAL WITHOUT CONTRAST TECHNIQUE: Multidetector CT imaging of the maxillofacial structures was performed. Multiplanar CT image reconstructions were also generated. RADIATION DOSE REDUCTION: This exam was performed according to the departmental dose-optimization program which includes automated exposure control, adjustment of the mA and/or kV according to patient size and/or use of iterative reconstruction technique. COMPARISON:  02/06/2023 FINDINGS: Osseous/dental: Redemonstration of area of confluent  periapical lucency within the right mandibular body, involving the sockets of most of the absent right mandibular teeth. There is overlying soft tissue swelling without discrete fluid collection. There is edema of the soft tissues of the retromolar trigone. Orbits: Negative. No traumatic or inflammatory finding. Sinuses: Clear Soft tissues: Right facial soft tissue edema as above. Limited intracranial: Negative IMPRESSION: Unchanged appearance of chronic right mandibular osteomyelitis. Overlying soft tissue swelling and retromolar trigone edema without discrete fluid collection. Electronically Signed   By: Deatra Robinson M.D.   On: 06/12/2023 00:26   DG Chest 2 View  Result Date: 06/11/2023 CLINICAL DATA:  Suspected sepsis.  Tooth infection since April. EXAM: CHEST - 2 VIEW COMPARISON:  06/20/2022 FINDINGS: Postoperative changes in the mediastinum, lumbar spine, and left shoulder. A loop recorder is present. Cardiac pacemaker. Mild cardiac enlargement. No airspace disease or consolidation in the lungs. No pleural effusions. No pneumothorax. Mediastinal contours appear intact. Calcification of the aorta. Old bilateral rib fractures. Degenerative changes in the spine and right shoulder. IMPRESSION: Cardiac enlargement.  No evidence of active pulmonary disease. Electronically Signed   By: Burman Nieves M.D.   On: 06/11/2023 20:18     Data Reviewed: Relevant notes from primary care and specialist visits, past discharge summaries as available in EHR, including Care Everywhere. Prior  diagnostic testing as pertinent to current admission diagnoses Updated medications and problem lists for reconciliation ED course, including vitals, labs, imaging, treatment and response to treatment Triage notes, nursing and pharmacy notes and ED provider's notes Notable results as noted in HPI  >>Facial/ Right lower jaw/ neck cellulitis / draining wound.  OM per CT scan. Wound cultures collected and sent.  Cont IV abx per  sepsis protocol / ID consult./ MFSX consult. Off of bisphosphonate.   >>C/H A. Fib: Pt on Eliquis and coreg.    >>DM II: Glycemic protocol.   >>CAD; Continue patient's home beta-blocker statin therapy and Eliquis.   DVT prophylaxis:  Eliquis .  Consults:  ID.  Advance Care Planning:    Code Status: Prior   Family Communication:  Daughter at bedside.   Disposition Plan:  Home   Severity of Illness: [id number] appropriate patient status for this patient is INPATIENT. Inpatient status is judged to be reasonable and necessary in order to provide the required intensity of service to ensure the patient's safety. The patient's presenting symptoms, physical exam findings, and initial radiographic and laboratory data in the context of their chronic comorbidities is felt to place them at high risk for further clinical deterioration. Furthermore, it is not anticipated that the patient will be medically stable for discharge from the hospital within 2 midnights of admission.   * I certify that at the point of admission it is my clinical judgment that the patient will require inpatient hospital care spanning beyond 2 midnights from the point of admission due to high intensity of service, high risk for further deterioration and high frequency of surveillance required.*  Author: Gertha Calkin, MD 06/12/2023 3:06 AM  For on call review [email or web address].

## 2023-06-12 NOTE — Progress Notes (Signed)
ED Pharmacy Antibiotic Sign Off An antibiotic consult was received from an ED provider for Aztreonam, Vancomycin per pharmacy dosing for sepsis. A chart review was completed to assess appropriateness.   The following one time order(s) were placed:  Aztreonam 2 gm IV X 1 and Vancomycin 1500 mg IV X 1.   Further antibiotic and/or antibiotic pharmacy consults should be ordered by the admitting provider if indicated.   Thank you for allowing pharmacy to be a part of this patient's care.   Scherrie Gerlach [facility]  Clinical Pharmacist 06/12/23 12:05 AM

## 2023-06-12 NOTE — Progress Notes (Signed)
Sent 3 week prescription of augmentin XR 2 grams BID x 3 weeks to [facility]- Morrie Sheldon will bring it from the pharmacy at 6pm

## 2023-06-12 NOTE — Progress Notes (Addendum)
Progress Note   Patient: Amber [name] ZYS:[id number] DOB: [birth year] DOA: 06/11/2023     0 DOS: the patient was seen and examined on 06/12/2023   Brief hospital course: 89yo with h/o HFrEF, chronic hypoxic respiratory failure on 3L home O2, DM, pacemaker placement, CAD s/p CABG (2008), HTN, HLD, and chronic pain who presented on 12/5 with R jaw swelling.  She was last admitted from 8/2-4 with facial cellulitis in the setting of severe otogenic disease.  She reports a draining open wound of her R submandibular region that she reports has been present since April.  Imaging shows osteomyelitis.  Wound cultures pending, started on Vanc/Flagyl/Aztreonam.  ID was consulted.  Discussed with oral surgery at [facility].  Assessment and Plan:  Chronic R mandibular osteomyelitis Patient presenting with jaw line wound and imaging c/w chronic R mandibular osteomyelitis. Imaging c/w chronic mandibular osteomyelitis She has been treated with PO antibiotics without success She appears to have pus both inside the mouth along the jaw line and externally I&D would be ideal but there is no oral surgery available at [facility] or [facility]; [facility] is willing to consult and provide recommendations with plan for outpatient f/u Will need antibiotics for 4-6 weeks, currently on Unasyn Further important considerations include nutrition, diabetes control; dietician and DM coordinator consults requested ID is consulting as the treatment is likely to be with long-term abx and outpatient f/u Avoid bisphosphonates, as this may have contributed to jaw infection Discussed with [facility] oral surgery - no beds available, can be treated as outpatient, no apparent pathological fracture yet but clearly severe odontogenic infection.  With the degree of erosion, she is at risk for fracture. [facility] see Monday as an outpatient, Dr. Sherrine Maples Reside, at 930AM, at [facility] - [address], [address], [place] - park on 3rd floor of patient  parking deck and look at medical center and it is on the far left.  COPD with chronic hypoxic respiratory failure Wears 3L home O2 Appears to be compensated at this time Continue Duonebs, Albuterol  HFrEF/CAD 04/2021 echo with EF 30-35% and severe AS Has pacemaker S/p CABG 2008, Myoview in 2018 without ischemia Hold Lasix, Entresto, Jardiance Continue Eliquis  DM A1c is 8 Hold Jardiance She appears to be using Lantus on a sliding scale basis Cover with moderate-scale SSI  Continue gabapentin  HTN Continue carvedilol  HLD Continue pravastatin, Zetia     Consultants: ID Maxillofacial surgery - none available  Procedures: None  Antibiotics: Cefepime x 1 Vancomycin x 1 Metronidazole x 1 Aztreonam x 1 Unasyn 12/6-  30 Day Unplanned Readmission Risk Score    Flowsheet Row ED to Hosp-Admission (Current) from 06/11/2023 in [facility] Emergency Department at [facility]  30 Day Unplanned Readmission Risk Score (%) 19.72 Filed at 06/12/2023 0801       This score is the patient's risk of an unplanned readmission within 30 days of being discharged (0 -100%). The score is based on dignosis, age, lab data, medications, orders, and past utilization.   Low:  0-14.9   Medium: 15-21.9   High: 22-29.9   Extreme: 30 and above           Subjective: Somnolent this AM.  History provided by her DIL.   Objective: Vitals:   06/12/23 0634 06/12/23 0634  BP:  134/64  Pulse:  68  Resp:  19  Temp:  97.6 F (36.4 C)  SpO2: 97% 97%    Intake/Output Summary (Last 24 hours)  at 06/12/2023 0837 Last data filed at 06/12/2023 0326 Gross per 24 hour  Intake 250 ml  Output --  Net 250 ml   Filed Weights   06/11/23 1807  Weight: 69.4 kg    Exam:  General:  Appears calm and comfortable and is in NAD Eyes:  EOMI, normal lids, iris ENT:  grossly normal lips & tongue, mmm; poor dentition with greenish drainage along R gumline, pustule that is draining along R  jawline       Cardiovascular:  RRR, no m/r/g. No LE edema.  Respiratory:   CTA bilaterally with no wheezes/rales/rhonchi.  Normal respiratory effort. Abdomen:  soft, NT, ND Skin:  no rash or induration seen on limited exam Musculoskeletal:  grossly normal tone BUE/BLE, good ROM, no bony abnormality Psychiatric:  somnolent mood and affect Neurologic:  unable to perform  Data Reviewed: I have reviewed the patient's lab results since admission.  Pertinent labs for today include:   Glucose 136 Albumin 2.7 WBC 6.8 Hgb 9.3 Wound culture pending Blood cultures NTD    Family Communication: [name] was present throughout evaluation  Disposition: Status is: Inpatient Remains inpatient appropriate because: needs IV antibiotics  Planned Discharge Destination:  TBD    Time spent: 50 minutes  Author: Jonah Blue, MD 06/12/2023 8:29 AM  For on call review [email or web address].

## 2023-06-12 NOTE — ED Notes (Signed)
Bed change and brief change.

## 2023-06-12 NOTE — ED Notes (Signed)
Ambulated pt around her room with O2/Bruno at 5L. Pt was able to ambulate slowly but saturation came down to 86%.

## 2023-06-12 NOTE — Consult Note (Addendum)
NAME: Amber [name]  DOB: May 01, 1930  MRN: [id number]  Date/Time: 06/12/2023 10:03 AM  REQUESTING PROVIDER: Dr.PAtel Subjective:  REASON FOR CONSULT: Mandibular osteomyelitis/osteonecrosis ? Amber [name] is a 89 y.o. with a history of HLD, PAF, COPD, HTN, DM, CHB with PPM, CAD s/p CABG , CKD, Anemia, CHF( EF 25%)b/l THA, RT TKA Was in hospital in Aug  8/2-02/08/23 with dental  pain  bottom right , loose teeth, swelling , CT showing severe odontogenic disease and treated as cellulitis with uansyn and then sent home on augmentin presented to te ED on 06/12/23 with abscess and drainage from the lower jaw Pt has had a swelling and pain rt jaw since April and seen dentist and had been on 2 courses of amoxiclllin. A few days ago it got worse with drainage . No fever as per patient and daughter in law Vitals in the ED  06/11/23  BP 119/79  Temp 98.2 F (36.8 C)  Pulse Rate 59 !  Resp 17  SpO2 99 %  O2 Flow Rate (L/min) 3 L/min    Latest Reference Range & Units 06/11/23  WBC 4.0 - 10.5 K/uL 6.7  Hemoglobin 12.0 - 15.0 g/dL 44.0 (L)  HCT 10.2 - 72.5 % 35.4 (L)  Platelets 150 - 400 K/uL 431 (H)  Creatinine 0.44 - 1.00 mg/dL 3.66   CT mandible shows chronic osteo with overlying soft tissue swelling   Past Medical History:  Diagnosis Date   Acute metabolic encephalopathy 06/20/2022   Anemia    Anxiety    Arthritis    Benign neoplasm of colon    CHF (congestive heart failure) (HCC)    Chronic kidney disease    Chronic pain    Complication of anesthesia 1980's   hard time waking up   Coronary artery disease with unspecified angina pectoris    Cough    Diabetes mellitus without complication (HCC)    Essential hypertension    GERD (gastroesophageal reflux disease)    High risk medication use    Hyperlipidemia    Hypoglycemia 06/20/2022   Myocardial injury 06/20/2022   Plantar fascial fibromatosis    Presence of permanent cardiac pacemaker    Shortness of breath dyspnea      Past Surgical History:  Procedure Laterality Date   ABDOMINAL HYSTERECTOMY     BACK SURGERY  1960's   cage and screws in lower back   CATARACT EXTRACTION W/ INTRAOCULAR LENS  IMPLANT, BILATERAL Bilateral    CORONARY ARTERY BYPASS GRAFT  2008   triple   HARDWARE REMOVAL Right 12/08/2019   Procedure: HARDWARE REMOVAL;  Surgeon: Kennedy Bucker, MD;  Location: [facility] ORS;  Service: Orthopedics;  Laterality: Right;   JOINT REPLACEMENT Right    hip and knee   ORIF ANKLE FRACTURE Right 07/27/2014   Procedure: OPEN REDUCTION INTERNAL FIXATION (ORIF) ANKLE FRACTURE;  Surgeon: Harvie Junior, MD;  Location: [facility] OR;  Service: Orthopedics;  Laterality: Right;   ORIF TIBIA FRACTURE Right 12/08/2019   Procedure: OPEN REDUCTION INTERNAL FIXATION (ORIF) TIBIA FRACTURE;  Surgeon: Kennedy Bucker, MD;  Location: [facility] ORS;  Service: Orthopedics;  Laterality: Right;   PACEMAKER INSERTION Left 03/14/2015   Procedure: INSERTION PACEMAKER;  Surgeon: Marcina Millard, MD;  Location: [facility] ORS;  Service: Cardiovascular;  Laterality: Left;   PACEMAKER LEADLESS INSERTION N/A 01/24/2020   Procedure: PACEMAKER LEADLESS INSERTION;  Surgeon: Marcina Millard, MD;  Location: [facility] INVASIVE CV LAB;  Service: Cardiovascular;  Laterality: N/A;   ROTATOR CUFF REPAIR  Left    TOTAL HIP ARTHROPLASTY Right    TOTAL HIP ARTHROPLASTY Left 02/09/2018   Procedure: TOTAL HIP ARTHROPLASTY ANTERIOR APPROACH;  Surgeon: Kennedy Bucker, MD;  Location: [facility] ORS;  Service: Orthopedics;  Laterality: Left;   TOTAL KNEE ARTHROPLASTY Right    VEIN LIGATION AND STRIPPING      Social History   Socioeconomic History   Marital status: Widowed    Spouse name: Not on file   Number of children: Not on file   Years of education: Not on file   Highest education level: Not on file  Occupational History   Not on file  Tobacco Use   Smoking status: Never   Smokeless tobacco: Never  Vaping Use   Vaping status: Never Used  Substance and Sexual  Activity   Alcohol use: No   Drug use: No   Sexual activity: Not on file  Other Topics Concern   Not on file  Social History Narrative   Not on file   Social Determinants of Health   Financial Resource Strain: Not on file  Food Insecurity: No Food Insecurity (02/06/2023)   Hunger Vital Sign    Worried About Running Out of Food in the Last Year: Never true    Ran Out of Food in the Last Year: Never true  Transportation Needs: No Transportation Needs (02/06/2023)   PRAPARE - Administrator, Civil Service (Medical): No    Lack of Transportation (Non-Medical): No  Physical Activity: Not on file  Stress: Not on file  Social Connections: Not on file  Intimate Partner Violence: Not At Risk (02/06/2023)   Humiliation, Afraid, Rape, and Kick questionnaire    Fear of Current or Ex-Partner: No    Emotionally Abused: No    Physically Abused: No    Sexually Abused: No    Family History  Problem Relation Age of Onset   Heart attack Mother    Heart disease Father    Alzheimer's disease Sister    Cervical cancer Sister    Heart failure Son    Allergies  Allergen Reactions   Ambien [Zolpidem Tartrate] Other (See Comments)    Reaction:  Keeps pt awake    Penicillins Itching and Other (See Comments)    Has patient had a PCN reaction causing immediate rash, facial/tongue/throat swelling, SOB or lightheadedness with hypotension: No Has patient had a PCN reaction causing severe rash involving mucus membranes or skin necrosis: No Has patient had a PCN reaction that required hospitalization No Has patient had a PCN reaction occurring within the last 10 years: No If all of the above answers are "NO", then may proceed with Cephalosporin use.   Iodine Itching   Succinylcholine Other (See Comments)    Reaction:  Unknown    Etodolac Other (See Comments)    GI upset   Nsaids Other (See Comments)    Reaction: gi upset   I? Current Facility-Administered Medications  Medication Dose  Route Frequency Provider Last Rate Last Admin   acetaminophen (TYLENOL) tablet 650 mg  650 mg Oral Q6H PRN Gertha Calkin, MD       Or   acetaminophen (TYLENOL) suppository 650 mg  650 mg Rectal Q6H PRN Gertha Calkin, MD       ceFEPIme (MAXIPIME) 2 g in sodium chloride 0.9 % 100 mL IVPB  2 g Intravenous Q12H Irena Cords V, MD 200 mL/hr at 06/12/23 0910 2 g at 06/12/23 0910   HYDROcodone-acetaminophen (NORCO/VICODIN) 5-325 MG per  tablet 1 tablet  1 tablet Oral Q4H PRN Gertha Calkin, MD       insulin aspart (novoLOG) injection 0-15 Units  0-15 Units Subcutaneous TID WC Gertha Calkin, MD   2 Units at 06/12/23 0835   metroNIDAZOLE (FLAGYL) IVPB 500 mg  500 mg Intravenous Q12H Gertha Calkin, MD       morphine (PF) 2 MG/ML injection 2 mg  2 mg Intravenous Q4H PRN Gertha Calkin, MD       sodium chloride flush (NS) 0.9 % injection 3 mL  3 mL Intravenous Q12H Gertha Calkin, MD       [START ON 06/14/2023] vancomycin (VANCOCIN) 1,500 mg in sodium chloride 0.9 % 500 mL IVPB  1,500 mg Intravenous Q48H Gertha Calkin, MD       Current Outpatient Medications  Medication Sig Dispense Refill   ezetimibe (ZETIA) 10 MG tablet Take 1 tablet by mouth daily.     HYDROcodone-acetaminophen (NORCO/VICODIN) 5-325 MG tablet Take 1 tablet by mouth every 4 (four) hours as needed.     omeprazole (PRILOSEC) 40 MG capsule Take 40 mg by mouth daily.     acetaminophen (TYLENOL) 325 MG tablet Take 2 tablets (650 mg total) by mouth every 6 (six) hours as needed for mild pain (or Fever >/= 101). 20 tablet 0   albuterol (PROVENTIL HFA;VENTOLIN HFA) 108 (90 Base) MCG/ACT inhaler Inhale 2 puffs into the lungs every 6 (six) hours as needed for wheezing or shortness of breath. 1 Inhaler 0   alendronate (FOSAMAX) 70 MG tablet Take 70 mg by mouth every Sunday.     amoxicillin-clavulanate (AUGMENTIN) 875-125 MG tablet Take 1 tablet by mouth every 12 (twelve) hours. 14 tablet 0   apixaban (ELIQUIS) 5 MG TABS tablet Take 5 mg by mouth 2 (two)  times daily.     calcium-vitamin D (OSCAL WITH D) 500-200 MG-UNIT tablet Take 1 tablet by mouth every other day.     carvedilol (COREG) 6.25 MG tablet Take 1 tablet (6.25 mg total) by mouth 2 (two) times daily with a meal. 60 tablet 0   empagliflozin (JARDIANCE) 10 MG TABS tablet Take 10 mg by mouth daily.     furosemide (LASIX) 20 MG tablet Take 1 tablet (20 mg total) by mouth 2 (two) times daily. (Patient taking differently: Take 20 mg by mouth daily.) 60 tablet 0   gabapentin (NEURONTIN) 100 MG capsule Take 200 mg by mouth 3 (three) times daily.     insulin glargine (LANTUS) 100 UNIT/ML injection Inject 0.05 mLs (5 Units total) into the skin daily. Hold if blood sugar less than 120 or if not eating well. 10 mL 0   ipratropium-albuterol (DUONEB) 0.5-2.5 (3) MG/3ML SOLN Take 3 mLs by nebulization 4 (four) times daily.     Multiple Vitamins-Minerals (PRESERVISION AREDS 2) CAPS Take 1 capsule by mouth every other day.     omeprazole (PRILOSEC) 40 MG capsule Take 40 mg by mouth daily.      ondansetron (ZOFRAN) 4 MG tablet Take 1 tablet (4 mg total) by mouth every 6 (six) hours as needed for nausea. 20 tablet 0   polyethylene glycol (MIRALAX / GLYCOLAX) 17 g packet Take 17 g by mouth daily as needed for mild constipation. 14 each 0   pravastatin (PRAVACHOL) 40 MG tablet Take 40 mg by mouth at bedtime.     sacubitril-valsartan (ENTRESTO) 24-26 MG Take 1 tablet by mouth twice daily 60 tablet 5     Abtx:  Anti-infectives (From admission, onward)    Start     Dose/Rate Route Frequency Ordered Stop   06/14/23 0100  vancomycin (VANCOCIN) 1,500 mg in sodium chloride 0.9 % 500 mL IVPB        1,500 mg 257.5 mL/hr over 120 Minutes Intravenous Every 48 hours 06/12/23 0452     06/12/23 1200  metroNIDAZOLE (FLAGYL) IVPB 500 mg        500 mg 100 mL/hr over 60 Minutes Intravenous Every 12 hours 06/12/23 0429     06/12/23 0800  ceFEPIme (MAXIPIME) 2 g in sodium chloride 0.9 % 100 mL IVPB        2 g 200  mL/hr over 30 Minutes Intravenous Every 12 hours 06/12/23 0519     06/12/23 0530  ceFEPIme (MAXIPIME) 2 g in sodium chloride 0.9 % 100 mL IVPB  Status:  Discontinued        2 g 200 mL/hr over 30 Minutes Intravenous Every 12 hours 06/12/23 0519 06/12/23 0519   06/12/23 0015  vancomycin (VANCOCIN) IVPB 1000 mg/200 mL premix       Placed in "Followed by" Linked Group   1,000 mg 200 mL/hr over 60 Minutes Intravenous  Once 06/12/23 0004 06/12/23 0327   06/12/23 0015  vancomycin (VANCOREADY) IVPB 500 mg/100 mL       Placed in "Followed by" Linked Group   500 mg 100 mL/hr over 60 Minutes Intravenous  Once 06/12/23 0004 06/12/23 0454   06/11/23 2345  aztreonam (AZACTAM) 2 g in sodium chloride 0.9 % 100 mL IVPB        2 g 200 mL/hr over 30 Minutes Intravenous  Once 06/11/23 2334 06/12/23 0045   06/11/23 2345  metroNIDAZOLE (FLAGYL) IVPB 500 mg        500 mg 100 mL/hr over 60 Minutes Intravenous  Once 06/11/23 2334 06/12/23 0053   06/11/23 2345  vancomycin (VANCOCIN) IVPB 1000 mg/200 mL premix  Status:  Discontinued        1,000 mg 200 mL/hr over 60 Minutes Intravenous  Once 06/11/23 2334 06/12/23 0003       REVIEW OF SYSTEMS:  Const: negative fever, negative chills, negative weight loss Eyes: negative diplopia or visual changes, negative eye pain ENT: negative coryza, negative sore throat Resp: negative cough, hemoptysis, has dyspnea on exertion Cards: negative for chest pain, palpitations, lower extremity edema GU: negative for frequency, dysuria and hematuria GI: Negative for abdominal pain, diarrhea, bleeding, constipation Skin: negative for rash and pruritus Heme: negative for easy bruising and gum/nose bleeding MS:  weakness Neurolo:rt sided face pain Psych: negative for feelings of anxiety, depression  Endocrine: , diabetes Allergy/Immunology- as above: Objective:  VITALS:  BP 134/64 (BP Location: Right Arm)   Pulse 68   Temp 97.6 F (36.4 C) (Axillary)   Resp 19   Ht 4'  7" (1.397 m)   Wt 69.4 kg   SpO2 97%   BMI 35.56 kg/m   PHYSICAL EXAM:  General: somnolent, but wakes up and is oriented and alert.  Head: Normocephalic, without obvious abnormality, atraumatic. Eyes: Conjunctivae clear, anicteric sclerae. Pupils are equal ENT Nares normal. No drainage or sinus tenderness. Oral cavity- very poor dentition- pus seen in the rt side on the floor of the mouth Swelling of the rt mandible Fistula with drainage       Neck:, symmetrical, no thyroid: non tender no carotid bruit and no JVD. Back: No CVA tenderness. Lungs: Clear to auscultation bilaterally. No Wheezing or Rhonchi. No rales.  Heart: s1sw- systolic murmur= pacemaker in place Abdomen: Soft, non-tender,not distended. Bowel sounds normal. No masses Extremities: atraumatic, no cyanosis. No edema. No clubbing Skin: No rashes or lesions. Or bruising Lymph: Cervical, supraclavicular normal. Neurologic: Grossly non-focal Pertinent Labs Lab Results CBC    Component Value Date/Time   WBC 6.8 06/12/2023 0458   RBC 3.48 (L) 06/12/2023 0458   HGB 9.3 (L) 06/12/2023 0458   HCT 30.4 (L) 06/12/2023 0458   PLT 344 06/12/2023 0458   MCV 87.4 06/12/2023 0458   MCH 26.7 06/12/2023 0458   MCHC 30.6 06/12/2023 0458   RDW 16.7 (H) 06/12/2023 0458   LYMPHSABS 1.0 06/12/2023 0458   MONOABS 0.8 06/12/2023 0458   EOSABS 0.3 06/12/2023 0458   BASOSABS 0.1 06/12/2023 0458       Latest Ref Rng & Units 06/12/2023    4:58 AM 06/11/2023    6:13 PM 02/08/2023    4:21 AM  CMP  Glucose 70 - 99 mg/dL 629  528  413   BUN 8 - 23 mg/dL 15  16  19    Creatinine 0.44 - 1.00 mg/dL 2.44  0.10  2.72   Sodium 135 - 145 mmol/L 138  136  136   Potassium 3.5 - 5.1 mmol/L 3.6  4.2  3.8   Chloride 98 - 111 mmol/L 105  99  102   CO2 22 - 32 mmol/L 24  25  23    Calcium 8.9 - 10.3 mg/dL 8.6  9.3  8.9   Total Protein 6.5 - 8.1 g/dL 6.4  7.7    Total Bilirubin <1.2 mg/dL 0.5  0.4    Alkaline Phos 38 - 126 U/L 54  63    AST  15 - 41 U/L 13  15    ALT 0 - 44 U/L 10  11        Microbiology: Recent Results (from the past 240 hour(s))  Culture, blood (Routine x 2)     Status: None (Preliminary result)   Collection Time: 06/11/23  6:14 PM   Specimen: BLOOD  Result Value Ref Range Status   Specimen Description BLOOD LEFT ANTECUBITAL  Final   Special Requests   Final    BOTTLES DRAWN AEROBIC AND ANAEROBIC Blood Culture adequate volume   Culture   Final    NO GROWTH < 12 HOURS Performed at [facility], [address], [place], [place] [address]    Report Status PENDING  Incomplete  Culture, blood (Routine x 2)     Status: None (Preliminary result)   Collection Time: 06/11/23 11:09 PM   Specimen: BLOOD  Result Value Ref Range Status   Specimen Description BLOOD BLOOD LEFT ARM  Final   Special Requests   Final    BOTTLES DRAWN AEROBIC AND ANAEROBIC Blood Culture results may not be optimal due to an excessive volume of blood received in culture bottles   Culture   Final    NO GROWTH < 12 HOURS Performed at [facility], [address] [place], [place] [address]    Report Status PENDING  Incomplete    IMAGING RESULTS: CT mandible Unchanged appearance of chronic right mandibular osteomyelitis. Overlying soft tissue swelling and retromolar trigone edema without discrete fluid collection. I have personally reviewed the films ? Impression/Recommendation ?Swelling of the rt mandible with sinus formation- odontogenic infection with chronic osteomyelitis of the mandible Need to r/o osteonecrosis due to Fosomax she was taking till aug 2024 [place] organisms are streptococcus, oral  anaerobes  and actinomyces She is currently on vanco/cefepime/flagyl Change to IV unasyn She needs to see OMFS for debridement- She has ana ppt on Monday at [facility] dental On discharge this weekend she can go on PO Augmentin XR 2 grams BID for 3 weeks ( will need upto 6 weeks) with OP follow with me  appt made for  12/17) Our OP pharmacy has this form of augmentin  and prescription sent to them- Morrie Sheldon will deliver the meds to the hosp pharmacy.this will have to be given to her on discharge  Systolic murmur- may consider 2 d echo  Penicillin listed as allergy- she tolerates amoxicillin wthout any issue- remove PCN from allergy list  Anemia  CHF= EF 20%  CHB on PPM  DM  CAD s/p CABG  B/l THA Rt TKA  Discussed the management with patient . Daughter, hospitalist and pharmacist ? I have personally spent  75 ---minutes involved in face-to-face and non-face-to-face activities for this patient on the day of the visit. Professional time spent includes the following activities: Preparing to see the patient (review of tests), Obtaining and/or reviewing separately obtained history (admission/discharge record), Performing a medically appropriate examination and/or evaluation , Ordering medications/tests/procedures, referring and communicating with other health care professionals, Documenting clinical information in the EMR, Independently interpreting results (not separately reported), Communicating results to the patient/family/caregiver, Counseling and educating the patient/family/caregiver and Care coordination (not separately reported).    ________________________________________________  Note:  This document was prepared using Conservation officer, historic buildings and may include unintentional dictation errors.

## 2023-06-12 NOTE — Progress Notes (Signed)
Pharmacy Antibiotic Note  Amber [name] is a 89 y.o. female admitted on 06/11/2023 with facial cellulitis.  Pharmacy has been consulted for Vancomycin, cefepime dosing. cefepime consult started b/c pt had received Augmentin and cephalexin previously without any apparent issue.   Today, ;06/12/2023 Day 1 antibiotics Renal: SCr 0.8 WBC WNL Afebrile 12/5 blood cx: NGTD 12/6 wound cx (swab of drainage) CT maxofacial: no fluid collection,  possible osteomyelitis of mandible  Plan: Adjust vancomycin to 1 gm IV q48h Goal AUC 400-600 Predicted AUC 571 using Scr 0.8, Vd 0.5 L/kg for BMI > 35 Cefepime 2 gm IV Q12H Follow renal function  Follow need to check vancomycin levels Follow cultures ID to see Removed penicillin allergy as patient tolerates ampicillin and amoxicillin.   Height: 4\' 7"  (139.7 cm) Weight: 69.4 kg (153 lb) IBW/kg (Calculated) : 34  Temp (24hrs), Avg:98.1 F (36.7 C), Min:97.6 F (36.4 C), Max:98.5 F (36.9 C)  Recent Labs  Lab 06/11/23 1812 06/11/23 1813 06/11/23 2309 06/12/23 0458  WBC  --  6.7  --  6.8  CREATININE  --  0.66  --  0.80  LATICACIDVEN 2.4*  --  1.5  --     Estimated Creatinine Clearance: 35.6 mL/min (by C-G formula based on SCr of 0.8 mg/dL).    Allergies  Allergen Reactions   Ambien [Zolpidem Tartrate] Other (See Comments)    Reaction:  Keeps pt awake    Iodine Itching   Succinylcholine Other (See Comments)    Reaction:  Unknown    Etodolac Other (See Comments)    GI upset   Nsaids Other (See Comments)    Reaction: gi upset    Antimicrobials this admission: 12/5 Aztreonam x 1  12/5 metronidazole >> 12/6 vancomycin >> 12/6 Cefepime >>  Dose adjustments this admission:   Microbiology results: See above  Thank you for allowing pharmacy to be a part of this patient's care.  Juliette Alcide, PharmD, BCPS, BCIDP Work Cell: [phone] 06/12/2023 10:47 AM

## 2023-06-13 DIAGNOSIS — I5022 Chronic systolic (congestive) heart failure: Secondary | ICD-10-CM | POA: Diagnosis present

## 2023-06-13 DIAGNOSIS — M272 Inflammatory conditions of jaws: Secondary | ICD-10-CM | POA: Diagnosis not present

## 2023-06-13 DIAGNOSIS — G4733 Obstructive sleep apnea (adult) (pediatric): Secondary | ICD-10-CM | POA: Diagnosis present

## 2023-06-13 DIAGNOSIS — J449 Chronic obstructive pulmonary disease, unspecified: Secondary | ICD-10-CM | POA: Diagnosis present

## 2023-06-13 LAB — CBG MONITORING, ED
Glucose-Capillary: 156 mg/dL — ABNORMAL HIGH (ref 70–99)
Glucose-Capillary: 118 mg/dL — ABNORMAL HIGH (ref 70–99)
Glucose-Capillary: 174 mg/dL — ABNORMAL HIGH (ref 70–99)

## 2023-06-13 LAB — BLOOD GAS, ARTERIAL
Acid-base deficit: 0.3 mmol/L (ref 0.0–2.0)
Bicarbonate: 27.1 mmol/L (ref 20.0–28.0)
O2 Content: 4 L/min
O2 Saturation: 96.1 %
Patient temperature: 37
pCO2 arterial: 55 mm[Hg] — ABNORMAL HIGH (ref 32–48)
pH, Arterial: 7.3 — ABNORMAL LOW (ref 7.35–7.45)
pO2, Arterial: 105 mm[Hg] (ref 83–108)

## 2023-06-13 LAB — C-REACTIVE PROTEIN: CRP: 7 mg/dL — ABNORMAL HIGH (ref <1.0)

## 2023-06-13 LAB — BLOOD GAS, VENOUS
Acid-base deficit: 0.6 mmol/L (ref 0.0–2.0)
Bicarbonate: 25.4 mmol/L (ref 20.0–28.0)
Delivery systems: POSITIVE
O2 Content: 4 L/min
O2 Saturation: 87.1 %
Patient temperature: 37
pCO2, Ven: 46 mm[Hg] (ref 44–60)
pH, Ven: 7.35 (ref 7.25–7.43)
pO2, Ven: 56 mm[Hg] — ABNORMAL HIGH (ref 32–45)

## 2023-06-13 LAB — SEDIMENTATION RATE: Sed Rate: 66 mm/h — ABNORMAL HIGH (ref 0–30)

## 2023-06-13 NOTE — ED Notes (Signed)
Pt provided peri care and repositioned in bed. New brief and pad in place. RN applied barrier cream to perineal area.

## 2023-06-13 NOTE — ED Notes (Signed)
Pt has attempted to remove CAP mask multiple times and states she refuses to wear it. Pt is of sound mind and her wish will be respected. [place] 5L humidified placed. O2 98% while pt is currently awake. Provider NP Jon Billings was in room as witness.

## 2023-06-13 NOTE — Inpatient Diabetes Management (Signed)
Inpatient Diabetes Program Recommendations  AACE/ADA: New Consensus Statement on Inpatient Glycemic Control (2015)  Target Ranges:  Prepandial:   less than 140 mg/dL      Peak postprandial:   less than 180 mg/dL (1-2 hours)      Critically ill patients:  140 - 180 mg/dL   Lab Results  Component Value Date   GLUCAP 156 (H) 06/13/2023   HGBA1C 8.0 (H) 06/12/2023    Review of Glycemic Control  Latest Reference Range & Units 06/12/23 11:30 06/12/23 16:19 06/12/23 21:40 06/13/23 08:57  Glucose-Capillary 70 - 99 mg/dL 132 (H) 440 (H) 102 (H) 156 (H)  (H): Data is abnormally high  Diabetes history: DM2 Outpatient Diabetes medications: Lantus 5 units every day, Jardiance 10 mg QD Current orders for Inpatient glycemic control: Novolog 0-15 units TID  Received referral for wound healing.  Will follow glucose trends.  Will continue to follow while inpatient.  Thank you, Dulce Sellar, MSN, CDCES Diabetes Coordinator Inpatient Diabetes Program [phone] (team pager from 8a-5p)

## 2023-06-13 NOTE — Progress Notes (Signed)
Progress Note   Patient: Amber [name] WUJ:[id number] DOB: [birth year] DOA: 06/11/2023     1 DOS: the patient was seen and examined on 06/13/2023   Brief hospital course: 89yo with h/o HFrEF, chronic hypoxic respiratory failure on 3L home O2, DM, pacemaker placement, CAD s/p CABG (2008), HTN, HLD, and chronic pain who presented on 12/5 with R jaw swelling.  She was last admitted from 8/2-4 with facial cellulitis in the setting of severe otogenic disease.  She reports a draining open wound of her R submandibular region that she reports has been present since April.  Imaging shows osteomyelitis.  Wound cultures pending, started on Vanc/Flagyl/Aztreonam.  ID was consulted.  Discussed with oral surgery at [facility].   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 She has been treated with PO antibiotics without success She appears to have pus both inside the mouth along the jaw line and externally I&D would be ideal but there is no oral surgery available at [facility] or [facility]; [facility] is willing to consult and provide recommendations with plan for outpatient f/u on Monday 12/8 Will need antibiotics for 4-6 weeks, currently on Unasyn -> Augmentin per ID at the time of dc ID is consulting as the treatment is likely to be with long-term abx and outpatient f/u Avoid bisphosphonates, as this may have contributed to jaw infection Discussed with [facility] oral surgery - no apparent pathological fracture yet but clearly severe odontogenic infection.  With the degree of erosion, she is at risk for fracture. [facility] see Monday as an outpatient, Dr. Sherrine Maples Reside, at 930AM, at [facility] - [address], [address] [place] - park on 3rd floor of patient parking deck and look at medical center and it is on the far left.   COPD with chronic hypoxic respiratory failure Wears 3L home  O2 Appears to be compensated at this time Continue Duonebs, Albuterol   HFrEF/CAD 04/2021 echo with EF 30-35% and severe AS Has pacemaker S/p CABG 2008, Myoview in 2018 without ischemia Hold Lasix, Entresto, Jardiance Continue Eliquis   DM A1c is 8 Hold Jardiance She appears to be using Lantus on a sliding scale basis Cover with moderate-scale SSI  Continue gabapentin   HTN Continue carvedilol   HLD Continue pravastatin, Zetia  Presumed OSA Patient with desats while sleeping as low as the 50s Mostly refused CPAP  ABG with mild hypoxia on 4L [place] O2       Consultants: ID Maxillofacial surgery - none available   Procedures: None   Antibiotics: Cefepime x 1 Vancomycin x 1 Metronidazole x 1 Aztreonam x 1 Unasyn 12/6-   Subjective: Awake and pleasant today with conversation.  Reports milder but ongoing drainage internally and externally.   Objective: Vitals:   06/13/23 1454 06/13/23 1500  BP:  (!) 154/59  Pulse:  67  Resp:  18  Temp: 98.5 F (36.9 C)   SpO2:  96%    Intake/Output Summary (Last 24 hours) at 06/13/2023 1551 Last data filed at 06/12/2023 1910 Gross per 24 hour  Intake 600 ml  Output --  Net 600 ml   Filed Weights   06/11/23 1807  Weight: 69.4 kg    Exam:  General:  Appears calm and comfortable and is in NAD Eyes:  EOMI, normal lids, iris ENT:  grossly normal hearing, lips & tongue, mmm; poor dentition Neck: L submandibular wound, less  drainage, no surrounding erythema Cardiovascular:  RRR Respiratory:   CTA bilaterally with no wheezes/rales/rhonchi.  Normal respiratory effort. Abdomen:  soft, NT, ND Skin:  no rash or induration seen on limited exam Musculoskeletal:  grossly normal tone BUE/BLE, good ROM, no bony abnormality Psychiatric:  grossly normal mood and affect, speech fluent and appropriate, AOx3 Neurologic:  CN 2-12 grossly intact, moves all extremities in coordinated fashion   Data Reviewed: I have reviewed the  patient's lab results since admission.  Pertinent labs for today include:   ABG: 7.35/46/56/25.4 on 4L A1c 8    Family Communication: None present; I spoke with her daughter today by telephone  Disposition: Status is: Inpatient Remains inpatient appropriate because: ongoing IV antibiotics x 1 more day   Planned Discharge Destination: Home    Time spent: 50 minutes  Author: Jonah Blue, MD 06/13/2023 3:51 PM  For on call review [email or web address].

## 2023-06-13 NOTE — ED Notes (Signed)
Pt pulled up in bed to eat. Meal tray set up for her.

## 2023-06-13 NOTE — ED Notes (Signed)
Pt assisted to toilet in room x2 assist. Pt bed sheet changed, gown changed, brief changed and pad changed. Pt provided clean warm blankets at this time. Pt returned to bed. Call light within reach.

## 2023-06-13 NOTE — ED Notes (Signed)
MD made aware BP 103/45 MAP 64 at this time.

## 2023-06-13 NOTE — Progress Notes (Signed)
   06/13/23 2257  BiPAP/CPAP/SIPAP  $ Non-Invasive Ventilator   (patient declined CPAP at this time)   Patient declined CPAP at bedtime at this time, currently on 4L [place].

## 2023-06-13 NOTE — ED Notes (Signed)
Pt at 75% of meal

## 2023-06-13 NOTE — ED Notes (Signed)
Pt had been dropping her oxygen to to 60-70s through the night, CPAP order was obtained. Pt had been on CPAP and still dropping into 60s then climbing  back into the 90% range. After consulting with another nurse and writing to the provider NP Jon Billings stating I'm noticing pts oxygenation is not sustaining and desiring a second opinion. While in pts room examining for pt positioning, placing new O2 monitor I noticed the pt had reached 56% O2 and had a perfect waveform, I had another nurse stay with the pt and went to grab the ER doc as he was closest. Dr Katrinka Blazing the ER doc made the recommendation to get a ABG as a start because the pt is a DNI. I stayed in the room with the pt while the ABG was being obtained. Once the pt was comfortable and her oxygen was back in the 90% range I came back to my station to inform NP Jon Billings of the circumstances.

## 2023-06-14 DIAGNOSIS — M272 Inflammatory conditions of jaws: Secondary | ICD-10-CM | POA: Diagnosis not present

## 2023-06-14 LAB — CBC WITH DIFFERENTIAL/PLATELET
Abs Immature Granulocytes: 0.04 10*3/uL (ref 0.00–0.07)
Basophils Absolute: 0.1 10*3/uL (ref 0.0–0.1)
Basophils Relative: 1 %
Eosinophils Absolute: 0.5 10*3/uL (ref 0.0–0.5)
Eosinophils Relative: 8 %
HCT: 30.8 % — ABNORMAL LOW (ref 36.0–46.0)
Hemoglobin: 9.3 g/dL — ABNORMAL LOW (ref 12.0–15.0)
Immature Granulocytes: 1 %
Lymphocytes Relative: 16 %
Lymphs Abs: 1 10*3/uL (ref 0.7–4.0)
MCH: 26.5 pg (ref 26.0–34.0)
MCHC: 30.2 g/dL (ref 30.0–36.0)
MCV: 87.7 fL (ref 80.0–100.0)
Monocytes Absolute: 1 10*3/uL (ref 0.1–1.0)
Monocytes Relative: 15 %
Neutro Abs: 3.9 10*3/uL (ref 1.7–7.7)
Neutrophils Relative %: 59 %
Platelets: 320 10*3/uL (ref 150–400)
RBC: 3.51 MIL/uL — ABNORMAL LOW (ref 3.87–5.11)
RDW: 16.8 % — ABNORMAL HIGH (ref 11.5–15.5)
WBC: 6.4 10*3/uL (ref 4.0–10.5)
nRBC: 0 % (ref 0.0–0.2)

## 2023-06-14 LAB — BASIC METABOLIC PANEL
Anion gap: 8 (ref 5–15)
BUN: 17 mg/dL (ref 8–23)
CO2: 26 mmol/L (ref 22–32)
Calcium: 8.9 mg/dL (ref 8.9–10.3)
Chloride: 103 mmol/L (ref 98–111)
Creatinine, Ser: 0.72 mg/dL (ref 0.44–1.00)
GFR, Estimated: >60 mL/min (ref >60)
Glucose, Bld: 131 mg/dL — ABNORMAL HIGH (ref 70–99)
Potassium: 3.9 mmol/L (ref 3.5–5.1)
Sodium: 137 mmol/L (ref 135–145)

## 2023-06-14 LAB — GLUCOSE, CAPILLARY
Glucose-Capillary: 108 mg/dL — ABNORMAL HIGH (ref 70–99)
Glucose-Capillary: 162 mg/dL — ABNORMAL HIGH (ref 70–99)

## 2023-06-14 MED ORDER — HYDROCODONE-ACETAMINOPHEN 5-325 MG PO TABS: 1.0000 | ORAL_TABLET | ORAL | 0 refills | Status: DC | PRN

## 2023-06-14 NOTE — Discharge Summary (Signed)
Physician Discharge Summary   Patient: Amber [name] MRN: [id number] DOB: 12-07-1929  Admit date:     06/11/2023  Discharge date: 06/14/23  Discharge Physician: Jonah Blue   PCP: Marguarite Arbour, MD   Recommendations at discharge:   Follow up tomorrow morning at 930AM (15 minutes prior) at the [facility] ([address] off [address] in [place]) for an appointment with Dr. Reside from oral surgery.  From the 3rd floor of the parking deck, the dental school is just to the left of the medical school. Continue antibiotics as prescribed - Augmentin until stopped by Dr. Reside You would benefit from sleep apnea testing and CPAP machine Do not take bisphosphonates (like Boniva) Take Tylenol as needed for pain with Norco if needed for severe pain; this is a sedating medication and increases risk of falls Follow up with Dr. Judithann Sheen in 1-2 weeks  Discharge Diagnoses: Principal Problem:   Osteomyelitis, jaw chronic Active Problems:   Facial cellulitis   Essential hypertension   HLD (hyperlipidemia)   Type 2 diabetes mellitus with diabetic polyneuropathy, with long-term current use of insulin (HCC)   Atrial fibrillation, chronic (HCC)   Chronic systolic CHF (congestive heart failure) (HCC)   OSA (obstructive sleep apnea)   COPD (chronic obstructive pulmonary disease) [facility])    Hospital Course: 89yo with h/o HFrEF, chronic hypoxic respiratory failure on 3L home O2, DM, pacemaker placement, CAD s/p CABG (2008), HTN, HLD, and chronic pain who presented on 12/5 with R jaw swelling. She was last admitted from 8/2-4 with facial cellulitis in the setting of severe otogenic disease. She reports a draining open wound of her R submandibular region that she reports has been present since April. Imaging shows osteomyelitis. Wound cultures pending, started on Vanc/Flagyl/Aztreonam. ID was consulted. Discussed with oral surgery at [facility].   Assessment and Plan:  Chronic R  mandibular osteomyelitis with facial cellulitis Patient presenting with jaw line wound and imaging c/w chronic R mandibular osteomyelitis. She has been treated with PO antibiotics without success PTA She appears to have pus both inside the mouth along the jaw line and externally I&D would be ideal but there is no oral surgery available at [facility] or [facility]; [facility] Oral Surgery consulted by telephone and made plan for outpatient f/u on Monday 12/8 Will need antibiotics for 4-6 weeks, currently on Unasyn -> Augmentin per ID at the time of dc ID is consulting as the treatment is likely to be with long-term abx and outpatient f/u Avoid bisphosphonates, as this may have contributed to jaw infection Discussed with [facility] oral surgery - no apparent pathological fracture yet but clearly severe odontogenic infection.  With the degree of erosion, she is at risk for fracture. [facility] as [facility], Dr. Sherrine Maples Reside, at 930AM, at [facility] - [address] [place] - park on 3rd floor of patient parking deck and look at medical center and it is on the far left.   COPD with chronic hypoxic respiratory failure Wears 3L home O2 Appears to be compensated at this time Continue Duonebs, Albuterol   HFrEF/CAD 04/2021 echo with EF 30-35% and severe AS Has pacemaker Resume Lasix, Entresto, Jardiance Continue Eliquis   DM A1c is 8 Resume Jardiance and Lantus  Continue gabapentin   HTN Continue carvedilol   HLD Continue pravastatin, Zetia   Presumed OSA Patient with desats while sleeping as low as the 50s Mostly refused CPAP  ABG with mild hypoxia  on 4L [place] O2  Class 2 Obesity Body mass index is 35.51 kg/m.Marland Kitchen  This increases her risk of morbidity and mortality       Consultants: ID Maxillofacial surgery - none available   Procedures: None   Antibiotics: Cefepime x 1 Vancomycin x 1 Metronidazole x 1 Aztreonam x 1 Unasyn 12/6-8 Augmentin 12/8- until  further notice     Pain control - [facility] Controlled Substance Reporting System database was reviewed. and patient was instructed, not to drive, operate heavy machinery, perform activities at heights, swimming or participation in water activities or provide baby-sitting services while on Pain, Sleep and Anxiety Medications; until their outpatient Physician has advised to do so again. Also recommended to not to take more than prescribed Pain, Sleep and Anxiety Medications.    Disposition: Home Diet recommendation:  Carb modified diet DISCHARGE MEDICATION: Allergies as of 06/14/2023       Reactions   Ambien [zolpidem Tartrate] Other (See Comments)   Reaction:  Keeps pt awake    Iodine Itching   Succinylcholine Other (See Comments)   Reaction:  Unknown    Etodolac Other (See Comments)   GI upset   Nsaids Other (See Comments)   Reaction: gi upset        Medication List     TAKE these medications    acetaminophen 325 MG tablet Commonly known as: TYLENOL Take 2 tablets (650 mg total) by mouth every 6 (six) hours as needed for mild pain (or Fever >/= 101).   albuterol 108 (90 Base) MCG/ACT inhaler Commonly known as: VENTOLIN HFA Inhale 2 puffs into the lungs every 6 (six) hours as needed for wheezing or shortness of breath.   amoxicillin-clavulanate 1000-62.5 MG 12 hr tablet Commonly known as: AUGMENTIN XR Take 2 tablets by mouth 2 (two) times daily.   apixaban 5 MG Tabs tablet Commonly known as: ELIQUIS Take 5 mg by mouth 2 (two) times daily.   calcium-vitamin D 500-200 MG-UNIT tablet Commonly known as: OSCAL WITH D Take 1 tablet by mouth every other day.   carvedilol 6.25 MG tablet Commonly known as: COREG Take 1 tablet (6.25 mg total) by mouth 2 (two) times daily with a meal.   empagliflozin 10 MG Tabs tablet Commonly known as: JARDIANCE Take 10 mg by mouth daily.   Entresto 24-26 MG Generic drug: sacubitril-valsartan Take 1 tablet by mouth twice  daily   ezetimibe 10 MG tablet Commonly known as: ZETIA Take 1 tablet by mouth daily.   furosemide 20 MG tablet Commonly known as: Lasix Take 1 tablet (20 mg total) by mouth 2 (two) times daily. What changed: when to take this   gabapentin 100 MG capsule Commonly known as: NEURONTIN Take 200 mg by mouth 3 (three) times daily.   HYDROcodone-acetaminophen 5-325 MG tablet Commonly known as: NORCO/VICODIN Take 1 tablet by mouth every 4 (four) hours as needed for moderate pain (pain score 4-6).   insulin glargine 100 UNIT/ML injection Commonly known as: LANTUS Inject 0.05 mLs (5 Units total) into the skin daily. Hold if blood sugar less than 120 or if not eating well.   ipratropium-albuterol 0.5-2.5 (3) MG/3ML Soln Commonly known as: DUONEB Take 3 mLs by nebulization 4 (four) times daily.   omeprazole 40 MG capsule Commonly known as: PRILOSEC Take 40 mg by mouth daily.   polyethylene glycol 17 g packet Commonly known as: MIRALAX / GLYCOLAX Take 17 g by mouth daily as needed for mild constipation.   pravastatin 40 MG tablet  Commonly known as: PRAVACHOL Take 40 mg by mouth at bedtime.   PreserVision AREDS 2 Caps Take 1 capsule by mouth every other day.        Discharge Exam:    Subjective: Feeling ok.  No specific concerns.   Objective: Vitals:   06/14/23 0802 06/14/23 0818  BP:  (!) 144/90  Pulse:  81  Resp:  20  Temp:  98.1 F (36.7 C)  SpO2: 94% 99%   No intake or output data in the 24 hours ending 06/14/23 1137 Filed Weights   06/11/23 1807 06/14/23 0500  Weight: 69.4 kg 69.3 kg    Exam:  General:  Appears calm and comfortable and is in NAD Eyes:  EOMI, normal lids, iris ENT:  grossly normal hearing, lips & tongue, mmm; poor dentition Neck: L submandibular wound, less drainage, no surrounding erythema Cardiovascular:  RRR Respiratory:   CTA bilaterally with no wheezes/rales/rhonchi.  Normal respiratory effort. Abdomen:  soft, NT, ND Skin:  no  rash or induration seen on limited exam Musculoskeletal:  grossly normal tone BUE/BLE, good ROM, no bony abnormality Psychiatric:  grossly normal mood and affect, speech fluent and appropriate, AOx3 Neurologic:  CN 2-12 grossly intact, moves all extremities in coordinated fashion  Data Reviewed: I have reviewed the patient's lab results since admission.  Pertinent labs for today include:  Glucose 131 WBC 6.4 Hgb 9.3 - stable     Condition at discharge: stable  The results of significant diagnostics from this hospitalization (including imaging, microbiology, ancillary and laboratory) are listed below for reference.   Imaging Studies: CT MAXILLOFACIAL WO CONTRAST  Result Date: 06/12/2023 CLINICAL DATA:  Right facial swelling EXAM: CT MAXILLOFACIAL WITHOUT CONTRAST TECHNIQUE: Multidetector CT imaging of the maxillofacial structures was performed. Multiplanar CT image reconstructions were also generated. RADIATION DOSE REDUCTION: This exam was performed according to the departmental dose-optimization program which includes automated exposure control, adjustment of the mA and/or kV according to patient size and/or use of iterative reconstruction technique. COMPARISON:  02/06/2023 FINDINGS: Osseous/dental: Redemonstration of area of confluent periapical lucency within the right mandibular body, involving the sockets of most of the absent right mandibular teeth. There is overlying soft tissue swelling without discrete fluid collection. There is edema of the soft tissues of the retromolar trigone. Orbits: Negative. No traumatic or inflammatory finding. Sinuses: Clear Soft tissues: Right facial soft tissue edema as above. Limited intracranial: Negative IMPRESSION: Unchanged appearance of chronic right mandibular osteomyelitis. Overlying soft tissue swelling and retromolar trigone edema without discrete fluid collection. Electronically Signed   By: Deatra Robinson M.D.   On: 06/12/2023 00:26   DG Chest 2  View  Result Date: 06/11/2023 CLINICAL DATA:  Suspected sepsis.  Tooth infection since April. EXAM: CHEST - 2 VIEW COMPARISON:  06/20/2022 FINDINGS: Postoperative changes in the mediastinum, lumbar spine, and left shoulder. A loop recorder is present. Cardiac pacemaker. Mild cardiac enlargement. No airspace disease or consolidation in the lungs. No pleural effusions. No pneumothorax. Mediastinal contours appear intact. Calcification of the aorta. Old bilateral rib fractures. Degenerative changes in the spine and right shoulder. IMPRESSION: Cardiac enlargement.  No evidence of active pulmonary disease. Electronically Signed   By: Burman Nieves M.D.   On: 06/11/2023 20:18    Microbiology: Results for orders placed or performed during the hospital encounter of 06/11/23  Culture, blood (Routine x 2)     Status: None (Preliminary result)   Collection Time: 06/11/23  6:14 PM   Specimen: BLOOD  Result Value Ref Range  Status   Specimen Description BLOOD LEFT ANTECUBITAL  Final   Special Requests   Final    BOTTLES DRAWN AEROBIC AND ANAEROBIC Blood Culture adequate volume   Culture   Final    NO GROWTH 3 DAYS Performed at [facility], [address], [place], [place] [address]    Report Status PENDING  Incomplete  Culture, blood (Routine x 2)     Status: None (Preliminary result)   Collection Time: 06/11/23 11:09 PM   Specimen: BLOOD  Result Value Ref Range Status   Specimen Description BLOOD BLOOD LEFT ARM  Final   Special Requests   Final    BOTTLES DRAWN AEROBIC AND ANAEROBIC Blood Culture results may not be optimal due to an excessive volume of blood received in culture bottles   Culture   Final    NO GROWTH 3 DAYS Performed at [facility], [address], [place], [place] [address]    Report Status PENDING  Incomplete  Aerobic Culture w Gram Stain (superficial specimen)     Status: None (Preliminary result)   Collection Time: 06/12/23  2:14 AM   Specimen: Cyst   Result Value Ref Range Status   Specimen Description   Final    CYSTS Performed at [facility], [address], [place], [place] [address]    Special Requests   Final    Normal Performed at [facility], [address] [place], [place] [address]    Gram Stain   Final    RARE WBC PRESENT,BOTH PMN AND MONONUCLEAR NO ORGANISMS SEEN    Culture   Final    RARE STAPHYLOCOCCUS AUREUS CULTURE REINCUBATED FOR BETTER GROWTH Performed at [facility], [address], [place], [place] [address]    Report Status PENDING  Incomplete   Organism ID, Bacteria STAPHYLOCOCCUS AUREUS  Final      Susceptibility   Staphylococcus aureus - MIC*    CIPROFLOXACIN <=0.5 SENSITIVE Sensitive     ERYTHROMYCIN <=0.25 SENSITIVE Sensitive     GENTAMICIN <=0.5 SENSITIVE Sensitive     OXACILLIN 0.5 SENSITIVE Sensitive     TETRACYCLINE <=1 SENSITIVE Sensitive     VANCOMYCIN 1 SENSITIVE Sensitive     TRIMETH/SULFA <=10 SENSITIVE Sensitive     CLINDAMYCIN <=0.25 SENSITIVE Sensitive     RIFAMPIN <=0.5 SENSITIVE Sensitive     Inducible Clindamycin NEGATIVE Sensitive     LINEZOLID 2 SENSITIVE Sensitive     * RARE STAPHYLOCOCCUS AUREUS    Labs: CBC: Recent Labs  Lab 06/11/23 1813 06/12/23 0458 06/14/23 0216  WBC 6.7 6.8 6.4  NEUTROABS 4.6 4.6 3.9  HGB 11.0* 9.3* 9.3*  HCT 35.4* 30.4* 30.8*  MCV 85.3 87.4 87.7  PLT 431* 344 320   Basic Metabolic Panel: Recent Labs  Lab 06/11/23 1813 06/12/23 0458 06/14/23 0216  NA 136 138 137  K 4.2 3.6 3.9  CL 99 105 103  CO2 25 24 26   GLUCOSE 189* 136* 131*  BUN 16 15 17   CREATININE 0.66 0.80 0.72  CALCIUM 9.3 8.6* 8.9  MG  --  1.8  --    Liver Function Tests: Recent Labs  Lab 06/11/23 1813 06/12/23 0458  AST 15 13*  ALT 11 10  ALKPHOS 63 54  BILITOT 0.4 0.5  PROT 7.7 6.4*  ALBUMIN 3.3* 2.7*   CBG: Recent Labs  Lab 06/12/23 2140 06/13/23 0857 06/13/23 1203 06/13/23 1720 06/14/23 0843  GLUCAP 118* 156* 118*  174* 108*  Discharge time spent: greater than 30 minutes.  Signed: Jonah Blue, MD [facility] 06/14/2023

## 2023-06-14 NOTE — Hospital Course (Signed)
89yo with h/o HFrEF, chronic hypoxic respiratory failure on 3L home O2, DM, pacemaker placement, CAD s/p CABG (2008), HTN, HLD, and chronic pain who presented on 12/5 with R jaw swelling. She was last admitted from 8/2-4 with facial cellulitis in the setting of severe otogenic disease. She reports a draining open wound of her R submandibular region that she reports has been present since April. Imaging shows osteomyelitis. Wound cultures pending, started on Vanc/Flagyl/Aztreonam. ID was consulted. Discussed with oral surgery at [facility].

## 2023-06-14 NOTE — Plan of Care (Signed)
  Problem: Education: Goal: Ability to describe self-care measures that may prevent or decrease complications (Diabetes Survival Skills Education) will improve Outcome: Progressing Goal: Individualized Educational Video(s) Outcome: Progressing   Problem: Coping: Goal: Ability to adjust to condition or change in health will improve Outcome: Progressing   Problem: Fluid Volume: Goal: Ability to maintain a balanced intake and output will improve Outcome: Progressing   Problem: Health Behavior/Discharge Planning: Goal: Ability to identify and utilize available resources and services will improve Outcome: Progressing Goal: Ability to manage health-related needs will improve Outcome: Progressing   Problem: Metabolic: Goal: Ability to maintain appropriate glucose levels will improve Outcome: Progressing   Problem: Nutritional: Goal: Maintenance of adequate nutrition will improve Outcome: Progressing Goal: Progress toward achieving an optimal weight will improve Outcome: Progressing   Problem: Skin Integrity: Goal: Risk for impaired skin integrity will decrease Outcome: Progressing   Problem: Tissue Perfusion: Goal: Adequacy of tissue perfusion will improve Outcome: Progressing   Problem: Education: Goal: Knowledge of General Education information will improve Description: Including pain rating scale, medication(s)/side effects and non-pharmacologic comfort measures Outcome: Progressing   Problem: Health Behavior/Discharge Planning: Goal: Ability to manage health-related needs will improve Outcome: Progressing   

## 2023-06-15 LAB — AEROBIC CULTURE W GRAM STAIN (SUPERFICIAL SPECIMEN): Special Requests: NORMAL

## 2023-06-16 LAB — CULTURE, BLOOD (ROUTINE X 2)
Culture: NO GROWTH
Culture: NO GROWTH
Special Requests: ADEQUATE

## 2023-06-25 ENCOUNTER — Ambulatory Visit: Payer: 59 | Attending: Infectious Diseases | Admitting: Infectious Diseases

## 2023-06-25 ENCOUNTER — Encounter: Payer: Self-pay | Admitting: Infectious Diseases

## 2023-06-25 ENCOUNTER — Other Ambulatory Visit
Admission: RE | Admit: 2023-06-25 | Discharge: 2023-06-25 | Disposition: A | Discharge status: Home / Self Care | Payer: 59 | Source: Ambulatory Visit | Attending: Infectious Diseases | Admitting: Infectious Diseases

## 2023-06-25 VITALS — BP 141/82 | HR 60 | Temp 97.3°F

## 2023-06-25 DIAGNOSIS — I442 Atrioventricular block, complete: Secondary | ICD-10-CM | POA: Insufficient documentation

## 2023-06-25 DIAGNOSIS — J449 Chronic obstructive pulmonary disease, unspecified: Secondary | ICD-10-CM | POA: Diagnosis not present

## 2023-06-25 DIAGNOSIS — Z90710 Acquired absence of both cervix and uterus: Secondary | ICD-10-CM | POA: Diagnosis not present

## 2023-06-25 DIAGNOSIS — M272 Inflammatory conditions of jaws: Secondary | ICD-10-CM | POA: Insufficient documentation

## 2023-06-25 DIAGNOSIS — M8788 Other osteonecrosis, other site: Secondary | ICD-10-CM | POA: Diagnosis not present

## 2023-06-25 DIAGNOSIS — I509 Heart failure, unspecified: Secondary | ICD-10-CM | POA: Insufficient documentation

## 2023-06-25 DIAGNOSIS — Z792 Long term (current) use of antibiotics: Secondary | ICD-10-CM | POA: Insufficient documentation

## 2023-06-25 DIAGNOSIS — B9629 Other Escherichia coli [E. coli] as the cause of diseases classified elsewhere: Secondary | ICD-10-CM | POA: Diagnosis not present

## 2023-06-25 DIAGNOSIS — Z794 Long term (current) use of insulin: Secondary | ICD-10-CM | POA: Diagnosis not present

## 2023-06-25 DIAGNOSIS — M8780 Other osteonecrosis, unspecified bone: Secondary | ICD-10-CM | POA: Diagnosis not present

## 2023-06-25 DIAGNOSIS — I130 Hypertensive heart and chronic kidney disease with heart failure and stage 1 through stage 4 chronic kidney disease, or unspecified chronic kidney disease: Secondary | ICD-10-CM | POA: Diagnosis not present

## 2023-06-25 DIAGNOSIS — M8648 Chronic osteomyelitis with draining sinus, other site: Secondary | ICD-10-CM | POA: Diagnosis not present

## 2023-06-25 DIAGNOSIS — E1122 Type 2 diabetes mellitus with diabetic chronic kidney disease: Secondary | ICD-10-CM | POA: Diagnosis not present

## 2023-06-25 DIAGNOSIS — Z950 Presence of cardiac pacemaker: Secondary | ICD-10-CM | POA: Diagnosis not present

## 2023-06-25 DIAGNOSIS — Z96651 Presence of right artificial knee joint: Secondary | ICD-10-CM | POA: Insufficient documentation

## 2023-06-25 DIAGNOSIS — E785 Hyperlipidemia, unspecified: Secondary | ICD-10-CM | POA: Diagnosis not present

## 2023-06-25 DIAGNOSIS — I480 Paroxysmal atrial fibrillation: Secondary | ICD-10-CM | POA: Insufficient documentation

## 2023-06-25 DIAGNOSIS — I2510 Atherosclerotic heart disease of native coronary artery without angina pectoris: Secondary | ICD-10-CM | POA: Insufficient documentation

## 2023-06-25 DIAGNOSIS — Z7984 Long term (current) use of oral hypoglycemic drugs: Secondary | ICD-10-CM | POA: Diagnosis not present

## 2023-06-25 DIAGNOSIS — Z7901 Long term (current) use of anticoagulants: Secondary | ICD-10-CM | POA: Insufficient documentation

## 2023-06-25 DIAGNOSIS — Z951 Presence of aortocoronary bypass graft: Secondary | ICD-10-CM | POA: Diagnosis not present

## 2023-06-25 DIAGNOSIS — N189 Chronic kidney disease, unspecified: Secondary | ICD-10-CM | POA: Diagnosis not present

## 2023-06-25 LAB — COMPREHENSIVE METABOLIC PANEL
ALT: 15 U/L (ref 0–44)
AST: 20 U/L (ref 15–41)
Albumin: 3.2 g/dL — ABNORMAL LOW (ref 3.5–5.0)
Alkaline Phosphatase: 62 U/L (ref 38–126)
Anion gap: 12 (ref 5–15)
BUN: 15 mg/dL (ref 8–23)
CO2: 28 mmol/L (ref 22–32)
Calcium: 8.9 mg/dL (ref 8.9–10.3)
Chloride: 97 mmol/L — ABNORMAL LOW (ref 98–111)
Creatinine, Ser: 0.82 mg/dL (ref 0.44–1.00)
GFR, Estimated: >60 mL/min (ref >60)
Glucose, Bld: 133 mg/dL — ABNORMAL HIGH (ref 70–99)
Potassium: 3.4 mmol/L — ABNORMAL LOW (ref 3.5–5.1)
Sodium: 137 mmol/L (ref 135–145)
Total Bilirubin: 0.8 mg/dL (ref <1.2)
Total Protein: 6.8 g/dL (ref 6.5–8.1)

## 2023-06-25 LAB — CBC WITH DIFFERENTIAL/PLATELET
Abs Immature Granulocytes: 0.03 10*3/uL (ref 0.00–0.07)
Basophils Absolute: 0.1 10*3/uL (ref 0.0–0.1)
Basophils Relative: 1 %
Eosinophils Absolute: 0.2 10*3/uL (ref 0.0–0.5)
Eosinophils Relative: 3 %
HCT: 32.3 % — ABNORMAL LOW (ref 36.0–46.0)
Hemoglobin: 9.6 g/dL — ABNORMAL LOW (ref 12.0–15.0)
Immature Granulocytes: 0 %
Lymphocytes Relative: 14 %
Lymphs Abs: 1 10*3/uL (ref 0.7–4.0)
MCH: 26.4 pg (ref 26.0–34.0)
MCHC: 29.7 g/dL — ABNORMAL LOW (ref 30.0–36.0)
MCV: 88.7 fL (ref 80.0–100.0)
Monocytes Absolute: 0.7 10*3/uL (ref 0.1–1.0)
Monocytes Relative: 10 %
Neutro Abs: 5 10*3/uL (ref 1.7–7.7)
Neutrophils Relative %: 72 %
Platelets: 306 10*3/uL (ref 150–400)
RBC: 3.64 MIL/uL — ABNORMAL LOW (ref 3.87–5.11)
RDW: 17.9 % — ABNORMAL HIGH (ref 11.5–15.5)
WBC: 7.1 10*3/uL (ref 4.0–10.5)
nRBC: 0 % (ref 0.0–0.2)

## 2023-06-25 LAB — C-REACTIVE PROTEIN: CRP: 1.3 mg/dL — ABNORMAL HIGH (ref <1.0)

## 2023-06-25 LAB — SEDIMENTATION RATE: Sed Rate: 43 mm/h — ABNORMAL HIGH (ref 0–30)

## 2023-06-25 NOTE — Progress Notes (Signed)
NAME: Amber [name]  DOB: 26-Nov-1929  MRN: [id number]  Date/Time: 06/25/2023 10:23 PM   Here with her daughter in law   Justis Aten is a 89 y.o. with a history of HLD, PAF, COPD, HTN, DM, CHB with PPM, CAD s/p CABG , CKD, Anemia, CHF( EF 25%)b/l THA, RT TKA Was in hospital in Aug  8/2-02/08/23 with dental  pain  bottom right , loose teeth, swelling , CT showing severe odontogenic disease and treated as cellulitis with uansyn and then sent home on augmentin presented to te ED on 06/12/23 with abscess and drainage from the lower jaw Pt has had a swelling and pain rt jaw since April and seen dentist and had been on 2 courses of amoxiclllin. A few days ago it got worse with drainage . No fever as per patient and daughter in law. She was discharged on 06/14/23  on high dose augmentin 2 grams BID and to follow up at [facility] . She saw them on 06/18/23 and same day had debridement of the mandible and extraction of teeth  by Dr.Reside He sent culture of the bone and it was anerobes, beaded organism ( likely actino) and ESBL ecoli- the latter is intermediate to amox/clav  She denies any side effects from the medication. She had a drain placed post-operatively, which fell off within the past week. She has not been able to inform the surgical team about the drain. She has a follow-up appointment with the dental surgeon at [facility] on December 23rd.   Today she has blood in her mouth She has no fever or chills Swelling rt side of face persist  Past Medical History:  Diagnosis Date   Acute metabolic encephalopathy 06/20/2022   Anemia    Anxiety    Arthritis    Benign neoplasm of colon    CHF (congestive heart failure) (HCC)    Chronic kidney disease    Chronic pain    Complication of anesthesia 1980's   hard time waking up   Coronary artery disease with unspecified angina pectoris    Cough    Diabetes mellitus without complication (HCC)    Essential hypertension    GERD  (gastroesophageal reflux disease)    High risk medication use    Hyperlipidemia    Hypoglycemia 06/20/2022   Myocardial injury 06/20/2022   Plantar fascial fibromatosis    Presence of permanent cardiac pacemaker    Shortness of breath dyspnea     Past Surgical History:  Procedure Laterality Date   ABDOMINAL HYSTERECTOMY     BACK SURGERY  1960's   cage and screws in lower back   CATARACT EXTRACTION W/ INTRAOCULAR LENS  IMPLANT, BILATERAL Bilateral    CORONARY ARTERY BYPASS GRAFT  2008   triple   HARDWARE REMOVAL Right 12/08/2019   Procedure: HARDWARE REMOVAL;  Surgeon: Kennedy Bucker, MD;  Location: [facility] ORS;  Service: Orthopedics;  Laterality: Right;   JOINT REPLACEMENT Right    hip and knee   ORIF ANKLE FRACTURE Right 07/27/2014   Procedure: OPEN REDUCTION INTERNAL FIXATION (ORIF) ANKLE FRACTURE;  Surgeon: Harvie Junior, MD;  Location: [facility] OR;  Service: Orthopedics;  Laterality: Right;   ORIF TIBIA FRACTURE Right 12/08/2019   Procedure: OPEN REDUCTION INTERNAL FIXATION (ORIF) TIBIA FRACTURE;  Surgeon: Kennedy Bucker, MD;  Location: [facility] ORS;  Service: Orthopedics;  Laterality: Right;   PACEMAKER INSERTION Left 03/14/2015   Procedure: INSERTION PACEMAKER;  Surgeon: Marcina Millard, MD;  Location: [facility] ORS;  Service: Cardiovascular;  Laterality: Left;   PACEMAKER LEADLESS INSERTION N/A 01/24/2020   Procedure: PACEMAKER LEADLESS INSERTION;  Surgeon: Marcina Millard, MD;  Location: [facility] INVASIVE CV LAB;  Service: Cardiovascular;  Laterality: N/A;   ROTATOR CUFF REPAIR Left    TOTAL HIP ARTHROPLASTY Right    TOTAL HIP ARTHROPLASTY Left 02/09/2018   Procedure: TOTAL HIP ARTHROPLASTY ANTERIOR APPROACH;  Surgeon: Kennedy Bucker, MD;  Location: [facility] ORS;  Service: Orthopedics;  Laterality: Left;   TOTAL KNEE ARTHROPLASTY Right    VEIN LIGATION AND STRIPPING      Social History   Socioeconomic History   Marital status: Widowed    Spouse name: Not on file   Number of children: Not on file    Years of education: Not on file   Highest education level: Not on file  Occupational History   Not on file  Tobacco Use   Smoking status: Never   Smokeless tobacco: Never  Vaping Use   Vaping status: Never Used  Substance and Sexual Activity   Alcohol use: No   Drug use: No   Sexual activity: Not on file  Other Topics Concern   Not on file  Social History Narrative   Not on file   Social Drivers of Health   Financial Resource Strain: Not on file  Food Insecurity: No Food Insecurity (06/13/2023)   Hunger Vital Sign    Worried About Running Out of Food in the Last Year: Never true    Ran Out of Food in the Last Year: Never true  Transportation Needs: No Transportation Needs (06/13/2023)   PRAPARE - Administrator, Civil Service (Medical): No    Lack of Transportation (Non-Medical): No  Physical Activity: Not on file  Stress: Not on file  Social Connections: Not on file  Intimate Partner Violence: Not At Risk (06/13/2023)   Humiliation, Afraid, Rape, and Kick questionnaire    Fear of Current or Ex-Partner: No    Emotionally Abused: No    Physically Abused: No    Sexually Abused: No    Family History  Problem Relation Age of Onset   Heart attack Mother    Heart disease Father    Alzheimer's disease Sister    Cervical cancer Sister    Heart failure Son    Allergies  Allergen Reactions   Ambien [Zolpidem Tartrate] Other (See Comments)    Reaction:  Keeps pt awake    Iodine Itching   Succinylcholine Other (See Comments)    Reaction:  Unknown    Etodolac Other (See Comments)    GI upset   Nsaids Other (See Comments) and Nausea And Vomiting    Reaction: gi upset  Takes BC powders   I? Current Outpatient Medications  Medication Sig Dispense Refill   acetaminophen (TYLENOL) 325 MG tablet Take 2 tablets (650 mg total) by mouth every 6 (six) hours as needed for mild pain (or Fever >/= 101). 20 tablet 0   albuterol (PROVENTIL HFA;VENTOLIN HFA) 108 (90  Base) MCG/ACT inhaler Inhale 2 puffs into the lungs every 6 (six) hours as needed for wheezing or shortness of breath. 1 Inhaler 0   amoxicillin-clavulanate (AUGMENTIN XR) 1000-62.5 MG 12 hr tablet Take 2 tablets by mouth 2 (two) times daily. 84 tablet 0   apixaban (ELIQUIS) 5 MG TABS tablet Take 5 mg by mouth 2 (two) times daily.     calcium-vitamin D (OSCAL WITH D) 500-200 MG-UNIT tablet Take 1 tablet by mouth every other day.  carvedilol (COREG) 6.25 MG tablet Take 1 tablet (6.25 mg total) by mouth 2 (two) times daily with a meal. 60 tablet 0   empagliflozin (JARDIANCE) 10 MG TABS tablet Take 10 mg by mouth daily.     ezetimibe (ZETIA) 10 MG tablet Take 1 tablet by mouth daily.     furosemide (LASIX) 20 MG tablet Take 1 tablet (20 mg total) by mouth 2 (two) times daily. (Patient taking differently: Take 20 mg by mouth daily.) 60 tablet 0   gabapentin (NEURONTIN) 100 MG capsule Take 200 mg by mouth 3 (three) times daily.     HYDROcodone-acetaminophen (NORCO/VICODIN) 5-325 MG tablet Take 1 tablet by mouth every 4 (four) hours as needed for moderate pain (pain score 4-6). 15 tablet 0   insulin glargine (LANTUS) 100 UNIT/ML injection Inject 0.05 mLs (5 Units total) into the skin daily. Hold if blood sugar less than 120 or if not eating well. 10 mL 0   ipratropium-albuterol (DUONEB) 0.5-2.5 (3) MG/3ML SOLN Take 3 mLs by nebulization 4 (four) times daily.     Multiple Vitamins-Minerals (PRESERVISION AREDS 2) CAPS Take 1 capsule by mouth every other day.     omeprazole (PRILOSEC) 40 MG capsule Take 40 mg by mouth daily.     polyethylene glycol (MIRALAX / GLYCOLAX) 17 g packet Take 17 g by mouth daily as needed for mild constipation. 14 each 0   pravastatin (PRAVACHOL) 40 MG tablet Take 40 mg by mouth at bedtime.     sacubitril-valsartan (ENTRESTO) 24-26 MG Take 1 tablet by mouth twice daily 60 tablet 5   No current facility-administered medications for this visit.     Abtx:  Anti-infectives  (From admission, onward)    None       REVIEW OF SYSTEMS:  Const: negative fever, negative chills, negative weight loss Eyes: negative diplopia or visual changes, negative eye pain ENT: negative coryza, negative sore throat Jaw on the rt side swollen and painful Resp: negative cough, hemoptysis, dyspnea Cards: negative for chest pain, palpitations, lower extremity edema GU: negative for frequency, dysuria and hematuria GI: Negative for abdominal pain, diarrhea, bleeding, constipation Skin: negative for rash and pruritus Heme: negative for easy bruising and gum/nose bleeding ZO:XWRUEA weakness Neurolo:negative for headaches, dizziness, vertigo, memory problems  Psych: negative for feelings of anxiety, depression  Endocrine: negative for thyroid, diabetes Allergy/Immunology- no antibiotic But many other Objective:  VITALS:  BP (!) 141/82   Pulse 60   Temp (!) 97.3 F (36.3 C) (Temporal)   SpO2 90%   PHYSICAL EXAM:  General: Alert, cooperative, no distress, appears stated age.  Head: Normocephalic, without obvious abnormality, atraumatic. Eyes: Conjunctivae clear, anicteric sclerae. Pupils are equal ENT Nares normal.   12/19     12/6   12/6  Oral cavity blood- lrt side lower no teeth Neck: Supple, symmetrical, no adenopathy, thyroid: non tender no carotid bruit and no JVD. Back: No CVA tenderness. Lungs: Clear to auscultation bilaterally. No Wheezing or Rhonchi. No rales. Heart: Regular rate and rhythm, no murmur, rub or gallop. Abdomen: did not examine as in wheel chair Extremities: atraumatic, no cyanosis. No edema. No clubbing Skin: No rashes or lesions. Or bruising Lymph: Cervical, supraclavicular normal. Neurologic: Grossly non-focal Pertinent Labs Lab Results CBC    Component Value Date/Time   WBC 7.1 06/25/2023 1220   RBC 3.64 (L) 06/25/2023 1220   HGB 9.6 (L) 06/25/2023 1220   HCT 32.3 (L) 06/25/2023 1220   PLT 306 06/25/2023 1220   MCV 88.7  06/25/2023 1220   MCH 26.4 06/25/2023 1220   MCHC 29.7 (L) 06/25/2023 1220   RDW 17.9 (H) 06/25/2023 1220   LYMPHSABS 1.0 06/25/2023 1220   MONOABS 0.7 06/25/2023 1220   EOSABS 0.2 06/25/2023 1220   BASOSABS 0.1 06/25/2023 1220       Latest Ref Rng & Units 06/25/2023   12:20 PM 06/14/2023    2:16 AM 06/12/2023    4:58 AM  CMP  Glucose 70 - 99 mg/dL 147  829  562   BUN 8 - 23 mg/dL 15  17  15    Creatinine 0.44 - 1.00 mg/dL 1.30  8.65  7.84   Sodium 135 - 145 mmol/L 137  137  138   Potassium 3.5 - 5.1 mmol/L 3.4  3.9  3.6   Chloride 98 - 111 mmol/L 97  103  105   CO2 22 - 32 mmol/L 28  26  24    Calcium 8.9 - 10.3 mg/dL 8.9  8.9  8.6   Total Protein 6.5 - 8.1 g/dL 6.8   6.4   Total Bilirubin <1.2 mg/dL 0.8   0.5   Alkaline Phos 38 - 126 U/L 62   54   AST 15 - 41 U/L 20   13   ALT 0 - 44 U/L 15   10       Microbiology: Recent Results (from the past 240 hours)  Aerobic Culture w Gram Stain (superficial specimen)     Status: None (Preliminary result)   Collection Time: 06/25/23 10:45 AM   Specimen: Wound  Result Value Ref Range Status   Specimen Description   Final    WOUND Performed at [facility], [address], [place], [place] [address]    Special Requests   Final    NONE Performed at [facility], [address] [place], [place] [address]    Gram Stain   Final    FEW WBC PRESENT, PREDOMINANTLY PMN RARE GRAM POSITIVE COCCI IN PAIRS RARE GRAM POSITIVE COCCI IN CHAINS Performed at [facility], [address], [place], [place] [address]    Culture PENDING  Incomplete   Report Status PENDING  Incomplete    IMAGING RESULTS: none I have personally reviewed the films ? 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 Currently on Augmentin 2 g BID. Noted improvement in wound appearance. E. Coli cultured from  bone which is rESBL. Also anerobes and beaded organisms which is likely actino Augmentin is the drug of choice for the last 2 . As ecoli intermittent to augmentin continue till the repeat culture I sent results May need upto 12 weeks of antibiotic  --If E. Coli persists, consider transition to intravenous antibiotics meropenem . -Spoke to oral surgeon Dr.Reside at [facility] Follow-up appointment at [facility] on 06/29/2023.  PAF on eliquis  ?CHB with PPM,  CAD s/p CABG  COPD B/l THA    ________________________________________________ Discussed with patient, and her daughter in law and Dr.Reside Follow in 2 weeks  Note:  This document was prepared using Dragon voice recognition software and may include unintentional dictation errors.

## 2023-06-25 NOTE — Patient Instructions (Signed)
  You had a follow-up appointment today after your recent hospitalization and surgery for osteonecrosis of the jaw. We discussed your current medication, the status of your wound, and the results of your recent culture.  YOUR PLAN:  -OSTEONECROSIS OF THE JAW: Osteonecrosis of the jaw is a condition where the bone tissue in the jaw dies due to a lack of blood supply. You recently had surgery to remove the dead bone tissue and have been taking Augmentin (amoxicillin clavulanate) to prevent infection. The culture from your wound showed E. coli, which is  partially resistant to your current antibiotic. We will continue your current antibiotic until the culture, I took  return. If the E. coli persists, we may need to switch to intravenous antibiotics. You have a follow-up appointment with your dental surgeon on December 23rd, 2024. If the culture results are favorable, you will continue the current antibiotic regimen for a total of 6 weeks.  INSTRUCTIONS:  Please continue taking Augmentin 2g twice daily as prescribed. We will repeat the culture today to check for the presence of E. coli. If the bacteria persist, we may need to switch to intravenous antibiotics. Make sure to attend your follow-up appointment with the dental surgeon at [facility] on December 23rd, 2024. If you notice any changes or have concerns, please contact our office immediately. Follow up ion 07/16/23- You will hear from me with the result

## 2023-06-28 LAB — AEROBIC CULTURE W GRAM STAIN (SUPERFICIAL SPECIMEN)

## 2023-07-02 ENCOUNTER — Telehealth: Payer: Self-pay

## 2023-07-02 NOTE — Telephone Encounter (Signed)
-----   Message from Lynn Ito sent at 07/02/2023  3:08 PM EST ----- Hi, can you talk to her daughter in law to find out how she is doing ? let her know that she is going to need IV ertapenem because the culture I did also has Ecoli which is partially resistant to the augmentin she is taking- if they agree we can get her in for PICC. thx ----- Message ----- From: Leory Plowman, Lab [facility] [place] Sent: 06/25/2023  12:51 PM EST To: Lynn Ito, MD

## 2023-07-02 NOTE — Telephone Encounter (Signed)
Patient's daughter Annabelle Harman advised of results and ok with proceeding with getting a picc line placed.  Dr. Rivka Safer will be arranging this with [facility] same day surgery. We will follow up with Annabelle Harman once appointment is scheduled. I have also reached out to [facility] letting her know we are working on scheduling and provided her with the medication dosing.  Antonin Meininger Jonathon Resides, CMA

## 2023-07-03 ENCOUNTER — Telehealth: Payer: Self-pay | Admitting: Infectious Diseases

## 2023-07-03 ENCOUNTER — Other Ambulatory Visit: Payer: Self-pay | Admitting: Infectious Diseases

## 2023-07-03 MED ORDER — SULFAMETHOXAZOLE-TRIMETHOPRIM 800-160 MG PO TABS: 1.0000 | ORAL_TABLET | Freq: Two times a day (BID) | ORAL | 1 refills | Status: DC

## 2023-07-03 NOTE — Telephone Encounter (Signed)
Called and spoke to her daughter in law- Annabelle Harman instead of IV ertapenem to treat ESBL ecoli, we will do PO bactrim DS 1 BID She will continue Augmentin XR 2 grams BID She has a n appt with me on 07/16/23 Will check cr, K then or earlier  Explained the sideffects to observe /note and call me .

## 2023-07-16 ENCOUNTER — Ambulatory Visit: Payer: 59 | Attending: Infectious Diseases | Admitting: Infectious Diseases

## 2023-07-16 ENCOUNTER — Other Ambulatory Visit: Payer: Self-pay

## 2023-07-16 ENCOUNTER — Other Ambulatory Visit
Admission: RE | Admit: 2023-07-16 | Discharge: 2023-07-16 | Disposition: A | Discharge status: Home / Self Care | Payer: 59 | Source: Ambulatory Visit | Attending: Infectious Diseases | Admitting: Infectious Diseases

## 2023-07-16 VITALS — BP 117/83 | HR 96 | Temp 97.6°F

## 2023-07-16 DIAGNOSIS — M272 Inflammatory conditions of jaws: Secondary | ICD-10-CM | POA: Insufficient documentation

## 2023-07-16 DIAGNOSIS — N189 Chronic kidney disease, unspecified: Secondary | ICD-10-CM | POA: Insufficient documentation

## 2023-07-16 DIAGNOSIS — E1122 Type 2 diabetes mellitus with diabetic chronic kidney disease: Secondary | ICD-10-CM | POA: Diagnosis not present

## 2023-07-16 DIAGNOSIS — I509 Heart failure, unspecified: Secondary | ICD-10-CM | POA: Diagnosis not present

## 2023-07-16 DIAGNOSIS — Z951 Presence of aortocoronary bypass graft: Secondary | ICD-10-CM | POA: Diagnosis not present

## 2023-07-16 DIAGNOSIS — I2510 Atherosclerotic heart disease of native coronary artery without angina pectoris: Secondary | ICD-10-CM | POA: Insufficient documentation

## 2023-07-16 DIAGNOSIS — I130 Hypertensive heart and chronic kidney disease with heart failure and stage 1 through stage 4 chronic kidney disease, or unspecified chronic kidney disease: Secondary | ICD-10-CM | POA: Diagnosis not present

## 2023-07-16 DIAGNOSIS — Z79899 Other long term (current) drug therapy: Secondary | ICD-10-CM | POA: Insufficient documentation

## 2023-07-16 DIAGNOSIS — Z7901 Long term (current) use of anticoagulants: Secondary | ICD-10-CM | POA: Insufficient documentation

## 2023-07-16 DIAGNOSIS — M8780 Other osteonecrosis, unspecified bone: Secondary | ICD-10-CM

## 2023-07-16 DIAGNOSIS — I480 Paroxysmal atrial fibrillation: Secondary | ICD-10-CM | POA: Insufficient documentation

## 2023-07-16 DIAGNOSIS — Z792 Long term (current) use of antibiotics: Secondary | ICD-10-CM | POA: Insufficient documentation

## 2023-07-16 DIAGNOSIS — Z794 Long term (current) use of insulin: Secondary | ICD-10-CM | POA: Diagnosis not present

## 2023-07-16 DIAGNOSIS — J449 Chronic obstructive pulmonary disease, unspecified: Secondary | ICD-10-CM | POA: Diagnosis not present

## 2023-07-16 LAB — COMPREHENSIVE METABOLIC PANEL
ALT: 18 U/L (ref 0–44)
AST: 28 U/L (ref 15–41)
Albumin: 3.3 g/dL — ABNORMAL LOW (ref 3.5–5.0)
Alkaline Phosphatase: 50 U/L (ref 38–126)
Anion gap: 10 (ref 5–15)
BUN: 25 mg/dL — ABNORMAL HIGH (ref 8–23)
CO2: 24 mmol/L (ref 22–32)
Calcium: 9.2 mg/dL (ref 8.9–10.3)
Chloride: 99 mmol/L (ref 98–111)
Creatinine, Ser: 1.34 mg/dL — ABNORMAL HIGH (ref 0.44–1.00)
GFR, Estimated: 37 mL/min — ABNORMAL LOW (ref >60)
Glucose, Bld: 144 mg/dL — ABNORMAL HIGH (ref 70–99)
Potassium: 4.6 mmol/L (ref 3.5–5.1)
Sodium: 133 mmol/L — ABNORMAL LOW (ref 135–145)
Total Bilirubin: 0.2 mg/dL (ref 0.0–1.2)
Total Protein: 6.9 g/dL (ref 6.5–8.1)

## 2023-07-16 LAB — CBC WITH DIFFERENTIAL/PLATELET
Abs Immature Granulocytes: 0.01 10*3/uL (ref 0.00–0.07)
Basophils Absolute: 0.1 10*3/uL (ref 0.0–0.1)
Basophils Relative: 2 %
Eosinophils Absolute: 0.3 10*3/uL (ref 0.0–0.5)
Eosinophils Relative: 6 %
HCT: 32 % — ABNORMAL LOW (ref 36.0–46.0)
Hemoglobin: 10 g/dL — ABNORMAL LOW (ref 12.0–15.0)
Immature Granulocytes: 0 %
Lymphocytes Relative: 23 %
Lymphs Abs: 1.2 10*3/uL (ref 0.7–4.0)
MCH: 26.2 pg (ref 26.0–34.0)
MCHC: 31.3 g/dL (ref 30.0–36.0)
MCV: 83.8 fL (ref 80.0–100.0)
Monocytes Absolute: 0.5 10*3/uL (ref 0.1–1.0)
Monocytes Relative: 11 %
Neutro Abs: 2.9 10*3/uL (ref 1.7–7.7)
Neutrophils Relative %: 58 %
Platelets: 255 10*3/uL (ref 150–400)
RBC: 3.82 MIL/uL — ABNORMAL LOW (ref 3.87–5.11)
RDW: 17.6 % — ABNORMAL HIGH (ref 11.5–15.5)
WBC: 5 10*3/uL (ref 4.0–10.5)
nRBC: 0 % (ref 0.0–0.2)

## 2023-07-16 LAB — SEDIMENTATION RATE: Sed Rate: 40 mm/h — ABNORMAL HIGH (ref 0–30)

## 2023-07-16 LAB — C-REACTIVE PROTEIN: CRP: <0.5 mg/dL (ref <1.0)

## 2023-07-16 MED ORDER — AMOXICILLIN-POT CLAVULANATE ER 1000-62.5 MG PO TB12
2.0000 | ORAL_TABLET | Freq: Two times a day (BID) | ORAL | 1 refills | Status: DC
  Filled 2023-07-16: qty 92, 23d supply, fill #0
  Filled 2023-07-16: qty 28, 7d supply, fill #0
  Filled 2023-08-24: qty 120, 30d supply, fill #1

## 2023-07-16 NOTE — Progress Notes (Deleted)
 PCP: [name], MD (last seen 10/24) Primary Cardiologist: Florencio Kava, MD/ Elodie Ronal Tinnie Launie, NP (last seen 10/24)  Chief Complaint:  HPI:  Ms Crandell is a 89 y/o female with a history of hyperlipidemia, PAF, COPD, HTN, DM, CHB w/ pacemaker, CAD (3 vessel CABG 2008), chronic pain, CKD, arthritis, anxiety, anemia and chronic heart failure.   Admitted 06/20/22 due to acute on chronic HF. Found to be hypoglycemic. Discharged after 3 days.   Admitted 02/06/23 due to facial swelling. CT of the maxillofacial region was obtained that demonstrated severe otogenic disease with multiple caries and large periapical lucency with cortical breakthrough from a right maxillary canine with extensive soft tissue stranding extending up to the TMJ and down into the submandibular region. CT scan of the maxillofacial region did not show any findings suggestive of Ludwig angina. Initially given IV antibiotics. Improved so transitioned to oral antibiotics before discharge.   Admitted 06/11/23 due to right jaw swelling. Reports a draining open wound of her R submandibular region that she reports has been present since April. Imaging shows osteomyelitis. Wound cultures pending, started on Vanc/Flagyl /Aztreonam . ID was consulted. Discussed with oral surgery at [facility]. Needs I & D but this will be done at [facility] school. May need 4-6 weeks of antibiotics.   Echo 07/27/16: EF of 35-40% along with moderate MR/TR. This is essentially unchanged from a previous echo done August 2017. Echo 01/27/17: EF of 40-45% along with mild MR.  Echo 12/06/19: EF of 55-60% along with mild LVH, moderate LAE and mild MR. Echo 02/06/22: EF of 25% along with moderate LVH.    She presents today for a HF follow-up visit with a chief complaint of    ROS: All systems negative except as listed in HPI, PMH and Problem List.  SH:  Social History   Socioeconomic History   Marital status: Widowed    Spouse name: Not on file    Number of children: Not on file   Years of education: Not on file   Highest education level: Not on file  Occupational History   Not on file  Tobacco Use   Smoking status: Never   Smokeless tobacco: Never  Vaping Use   Vaping status: Never Used  Substance and Sexual Activity   Alcohol use: No   Drug use: No   Sexual activity: Not on file  Other Topics Concern   Not on file  Social History Narrative   Not on file   Social Drivers of Health   Financial Resource Strain: Not on file  Food Insecurity: No Food Insecurity (06/13/2023)   Hunger Vital Sign    Worried About Running Out of Food in the Last Year: Never true    Ran Out of Food in the Last Year: Never true  Transportation Needs: No Transportation Needs (06/13/2023)   PRAPARE - Administrator, Civil Service (Medical): No    Lack of Transportation (Non-Medical): No  Physical Activity: Not on file  Stress: Not on file  Social Connections: Not on file  Intimate Partner Violence: Not At Risk (06/13/2023)   Humiliation, Afraid, Rape, and Kick questionnaire    Fear of Current or Ex-Partner: No    Emotionally Abused: No    Physically Abused: No    Sexually Abused: No    FH:  Family History  Problem Relation Age of Onset   Heart attack Mother    Heart disease Father    Alzheimer's disease Sister  Cervical cancer Sister    Heart failure Son     Past Medical History:  Diagnosis Date   Acute metabolic encephalopathy 06/20/2022   Anemia    Anxiety    Arthritis    Benign neoplasm of colon    CHF (congestive heart failure) (HCC)    Chronic kidney disease    Chronic pain    Complication of anesthesia 1980's   hard time waking up   Coronary artery disease with unspecified angina pectoris    Cough    Diabetes mellitus without complication (HCC)    Essential hypertension    GERD (gastroesophageal reflux disease)    High risk medication use    Hyperlipidemia    Hypoglycemia 06/20/2022   Myocardial  injury 06/20/2022   Plantar fascial fibromatosis    Presence of permanent cardiac pacemaker    Shortness of breath dyspnea     Current Outpatient Medications  Medication Sig Dispense Refill   acetaminophen  (TYLENOL ) 325 MG tablet Take 2 tablets (650 mg total) by mouth every 6 (six) hours as needed for mild pain (or Fever >/= 101). 20 tablet 0   albuterol  (PROVENTIL  HFA;VENTOLIN  HFA) 108 (90 Base) MCG/ACT inhaler Inhale 2 puffs into the lungs every 6 (six) hours as needed for wheezing or shortness of breath. 1 Inhaler 0   amoxicillin -clavulanate (AUGMENTIN  XR) 1000-62.5 MG 12 hr tablet Take 2 tablets by mouth 2 (two) times daily. 120 tablet 1   apixaban  (ELIQUIS ) 5 MG TABS tablet Take 5 mg by mouth 2 (two) times daily.     calcium -vitamin D  (OSCAL WITH D) 500-200 MG-UNIT tablet Take 1 tablet by mouth every other day.     carvedilol  (COREG ) 6.25 MG tablet Take 1 tablet (6.25 mg total) by mouth 2 (two) times daily with a meal. 60 tablet 0   empagliflozin  (JARDIANCE ) 10 MG TABS tablet Take 10 mg by mouth daily.     ezetimibe  (ZETIA ) 10 MG tablet Take 1 tablet by mouth daily.     furosemide  (LASIX ) 20 MG tablet Take 1 tablet (20 mg total) by mouth 2 (two) times daily. (Patient taking differently: Take 20 mg by mouth daily.) 60 tablet 0   gabapentin  (NEURONTIN ) 100 MG capsule Take 200 mg by mouth 3 (three) times daily.     HYDROcodone -acetaminophen  (NORCO/VICODIN) 5-325 MG tablet Take 1 tablet by mouth every 4 (four) hours as needed for moderate pain (pain score 4-6). 15 tablet 0   insulin  glargine (LANTUS ) 100 UNIT/ML injection Inject 0.05 mLs (5 Units total) into the skin daily. Hold if blood sugar less than 120 or if not eating well. 10 mL 0   ipratropium-albuterol  (DUONEB) 0.5-2.5 (3) MG/3ML SOLN Take 3 mLs by nebulization 4 (four) times daily.     Multiple Vitamins-Minerals (PRESERVISION AREDS 2) CAPS Take 1 capsule by mouth every other day.     omeprazole (PRILOSEC) 40 MG capsule Take 40 mg by  mouth daily.     polyethylene glycol (MIRALAX  / GLYCOLAX ) 17 g packet Take 17 g by mouth daily as needed for mild constipation. 14 each 0   pravastatin  (PRAVACHOL ) 40 MG tablet Take 40 mg by mouth at bedtime.     sacubitril -valsartan  (ENTRESTO ) 24-26 MG Take 1 tablet by mouth twice daily 60 tablet 5   sulfamethoxazole -trimethoprim  (BACTRIM  DS) 800-160 MG tablet Take 1 tablet by mouth 2 (two) times daily. 60 tablet 1   No current facility-administered medications for this visit.     PHYSICAL EXAM:  General:  Well appearing.  No resp difficulty HEENT: normal Neck: supple. JVP flat. No lymphadenopathy or thryomegaly appreciated. Cor: PMI normal. Regular rate & rhythm. No rubs, gallops or murmurs. Lungs: clear Abdomen: soft, nontender, nondistended. No hepatosplenomegaly. No bruits or masses. Extremities: no cyanosis, clubbing, rash, edema Neuro: alert & oriented x3, cranial nerves grossly intact. Moves all 4 extremities w/o difficulty. Affect pleasant.   ECG: not done    ASSESSMENT & PLAN:  1: Ischemic heart failure with reduced ejection fraction- - CABG 2008 - NYHA class III - euvolemic - weighing daily; reminded to  call for an overnight weight gain of >2 pounds or a weekly weight gain of >5 pounds.  - weight 159.2 pounds from last visit here 6 months ago - not adding salt to her food and is trying to eat low sodium foods  - continue jardiance  10mg  daily - continue entresto  24/26mg  BID - continue carvedilol  6.25mg  BID - continue furosemide  20mg  daily - will not add spiro as she gets some low BP readings at home and patient is 89 y/o so don't want to run the risk of falls - BNP 06/20/22 was 1239.9  2: HTN- - BP  - saw PCP ([facility]) 10/24 - BMP 07/16/23 reviewed and showed sodium 133, potassium 4.6, creatinine 1.34 and GFR 37  3: DM- - A1c 06/12/23 was 8.0% - glucose at home this morning was  - saw endocrinology Leatrice) 04/24  4: CAD- - 3 vessel CABG 2008 - saw  cardiology Willam Maiden) 10/24 - lipid panel 04/15/23: LDL 87, triglycerides 138  5: COPD- - saw pulmonology Alica) 06/24 - wearing oxygen  @ 3 L  6: Jaw osteomyelitis- - debridement of the right mandible and extraction of remaining dentition on 06/18/23.  - saw ID Judyth) 01/25

## 2023-07-16 NOTE — Patient Instructions (Addendum)
 You are here for follow up of the jaw infection- doing much  buetter Continue Augmentin  XR 2 grams Twice a day ( that is 2 pills in the morning and 2 pills in the evening) Continue sulfa  1 tablet twice a day Today will check blood work for you kidney, liver and white count

## 2023-07-16 NOTE — Progress Notes (Signed)
 NAME: Amber [name]  DOB: 1929/08/09  MRN: [id number]  Date/Time: 07/16/2023 10:11 AM   Here with a friend Follow up for mandible osteo with actino, esbl ecoli, anerobes On augmentin  XR 2 grams BID  Bactrim  DS 1 BID Ran out of augmentin  2 days ago Doing better Swelling of mandible much better  Amber [name] is a 89 y.o. with a history of HLD, PAF, COPD, HTN, DM, CHB with PPM, CAD s/p CABG , CKD, Anemia, CHF( EF 25%)b/l THA, RT TKA Was in hospital in Aug  8/2-02/08/23 with dental  pain  bottom right , loose teeth, swelling , CT showing severe odontogenic disease and treated as cellulitis with uansyn and then sent home on augmentin  presented to te ED on 06/12/23 with abscess and drainage from the lower jaw Pt has had a swelling and pain rt jaw since April and seen dentist and had been on 2 courses of amoxiclllin. A few days ago it got worse with drainage . No fever as per patient and daughter in law. She was discharged on 06/14/23  on high dose augmentin  2 grams BID and to follow up at [facility] . She saw them on 06/18/23 and same day had debridement of the mandible and extraction of teeth  by Dr.Reside He sent culture of the bone and it was anerobes, beaded organism ( likely actino) and ESBL ecoli- the latter is intermediate to amox/clav  She saw [facility] Dr.Reside on 12/23  Past Medical History:  Diagnosis Date   Acute metabolic encephalopathy 06/20/2022   Anemia    Anxiety    Arthritis    Benign neoplasm of colon    CHF (congestive heart failure) (HCC)    Chronic kidney disease    Chronic pain    Complication of anesthesia 1980's   hard time waking up   Coronary artery disease with unspecified angina pectoris    Cough    Diabetes mellitus without complication (HCC)    Essential hypertension    GERD (gastroesophageal reflux disease)    High risk medication use    Hyperlipidemia    Hypoglycemia 06/20/2022   Myocardial injury 06/20/2022   Plantar fascial fibromatosis     Presence of permanent cardiac pacemaker    Shortness of breath dyspnea     Past Surgical History:  Procedure Laterality Date   ABDOMINAL HYSTERECTOMY     BACK SURGERY  1960's   cage and screws in lower back   CATARACT EXTRACTION W/ INTRAOCULAR LENS  IMPLANT, BILATERAL Bilateral    CORONARY ARTERY BYPASS GRAFT  2008   triple   HARDWARE REMOVAL Right 12/08/2019   Procedure: HARDWARE REMOVAL;  Surgeon: Kathlynn Sharper, MD;  Location: [facility] ORS;  Service: Orthopedics;  Laterality: Right;   JOINT REPLACEMENT Right    hip and knee   ORIF ANKLE FRACTURE Right 07/27/2014   Procedure: OPEN REDUCTION INTERNAL FIXATION (ORIF) ANKLE FRACTURE;  Surgeon: Norleen LITTIE Gavel, MD;  Location: [facility];  Service: Orthopedics;  Laterality: Right;   ORIF TIBIA FRACTURE Right 12/08/2019   Procedure: OPEN REDUCTION INTERNAL FIXATION (ORIF) TIBIA FRACTURE;  Surgeon: Kathlynn Sharper, MD;  Location: [facility] ORS;  Service: Orthopedics;  Laterality: Right;   PACEMAKER INSERTION Left 03/14/2015   Procedure: INSERTION PACEMAKER;  Surgeon: Marsa Dooms, MD;  Location: [facility] ORS;  Service: Cardiovascular;  Laterality: Left;   PACEMAKER LEADLESS INSERTION N/A 01/24/2020   Procedure: PACEMAKER LEADLESS INSERTION;  Surgeon: Dooms Marsa, MD;  Location: [facility];  Service:  Cardiovascular;  Laterality: N/A;   ROTATOR CUFF REPAIR Left    TOTAL HIP ARTHROPLASTY Right    TOTAL HIP ARTHROPLASTY Left 02/09/2018   Procedure: TOTAL HIP ARTHROPLASTY ANTERIOR APPROACH;  Surgeon: Kathlynn Sharper, MD;  Location: [facility] ORS;  Service: Orthopedics;  Laterality: Left;   TOTAL KNEE ARTHROPLASTY Right    VEIN LIGATION AND STRIPPING      Social History   Socioeconomic History   Marital status: Widowed    Spouse name: Not on file   Number of children: Not on file   Years of education: Not on file   Highest education level: Not on file  Occupational History   Not on file  Tobacco Use   Smoking status: Never   Smokeless tobacco:  Never  Vaping Use   Vaping status: Never Used  Substance and Sexual Activity   Alcohol use: No   Drug use: No   Sexual activity: Not on file  Other Topics Concern   Not on file  Social History Narrative   Not on file   Social Drivers of Health   Financial Resource Strain: Not on file  Food Insecurity: No Food Insecurity (06/13/2023)   Hunger Vital Sign    Worried About Running Out of Food in the Last Year: Never true    Ran Out of Food in the Last Year: Never true  Transportation Needs: No Transportation Needs (06/13/2023)   PRAPARE - Administrator, Civil Service (Medical): No    Lack of Transportation (Non-Medical): No  Physical Activity: Not on file  Stress: Not on file  Social Connections: Not on file  Intimate Partner Violence: Not At Risk (06/13/2023)   Humiliation, Afraid, Rape, and Kick questionnaire    Fear of Current or Ex-Partner: No    Emotionally Abused: No    Physically Abused: No    Sexually Abused: No    Family History  Problem Relation Age of Onset   Heart attack Mother    Heart disease Father    Alzheimer's disease Sister    Cervical cancer Sister    Heart failure Son    Allergies  Allergen Reactions   Ambien [Zolpidem Tartrate] Other (See Comments)    Reaction:  Keeps pt awake    Iodine Itching   Succinylcholine Other (See Comments)    Reaction:  Unknown    Etodolac Other (See Comments)    GI upset   Nsaids Other (See Comments) and Nausea And Vomiting    Reaction: gi upset  Takes BC powders   I? Current Outpatient Medications  Medication Sig Dispense Refill   acetaminophen  (TYLENOL ) 325 MG tablet Take 2 tablets (650 mg total) by mouth every 6 (six) hours as needed for mild pain (or Fever >/= 101). 20 tablet 0   albuterol  (PROVENTIL  HFA;VENTOLIN  HFA) 108 (90 Base) MCG/ACT inhaler Inhale 2 puffs into the lungs every 6 (six) hours as needed for wheezing or shortness of breath. 1 Inhaler 0   amoxicillin -clavulanate (AUGMENTIN  XR)  1000-62.5 MG 12 hr tablet Take 2 tablets by mouth 2 (two) times daily. 84 tablet 0   apixaban  (ELIQUIS ) 5 MG TABS tablet Take 5 mg by mouth 2 (two) times daily.     calcium -vitamin D  (OSCAL WITH D) 500-200 MG-UNIT tablet Take 1 tablet by mouth every other day.     carvedilol  (COREG ) 6.25 MG tablet Take 1 tablet (6.25 mg total) by mouth 2 (two) times daily with a meal. 60 tablet 0   empagliflozin  (JARDIANCE ) 10  MG TABS tablet Take 10 mg by mouth daily.     ezetimibe  (ZETIA ) 10 MG tablet Take 1 tablet by mouth daily.     furosemide  (LASIX ) 20 MG tablet Take 1 tablet (20 mg total) by mouth 2 (two) times daily. (Patient taking differently: Take 20 mg by mouth daily.) 60 tablet 0   gabapentin  (NEURONTIN ) 100 MG capsule Take 200 mg by mouth 3 (three) times daily.     HYDROcodone -acetaminophen  (NORCO/VICODIN) 5-325 MG tablet Take 1 tablet by mouth every 4 (four) hours as needed for moderate pain (pain score 4-6). 15 tablet 0   insulin  glargine (LANTUS ) 100 UNIT/ML injection Inject 0.05 mLs (5 Units total) into the skin daily. Hold if blood sugar less than 120 or if not eating well. 10 mL 0   ipratropium-albuterol  (DUONEB) 0.5-2.5 (3) MG/3ML SOLN Take 3 mLs by nebulization 4 (four) times daily.     Multiple Vitamins-Minerals (PRESERVISION AREDS 2) CAPS Take 1 capsule by mouth every other day.     omeprazole (PRILOSEC) 40 MG capsule Take 40 mg by mouth daily.     polyethylene glycol (MIRALAX  / GLYCOLAX ) 17 g packet Take 17 g by mouth daily as needed for mild constipation. 14 each 0   pravastatin  (PRAVACHOL ) 40 MG tablet Take 40 mg by mouth at bedtime.     sacubitril -valsartan  (ENTRESTO ) 24-26 MG Take 1 tablet by mouth twice daily 60 tablet 5   sulfamethoxazole -trimethoprim  (BACTRIM  DS) 800-160 MG tablet Take 1 tablet by mouth 2 (two) times daily. 60 tablet 1   No current facility-administered medications for this visit.     Abtx:  Anti-infectives (From admission, onward)    None       REVIEW OF  SYSTEMS:  Const: negative fever, negative chills, negative weight loss Eyes: negative diplopia or visual changes, negative eye pain ENT: negative coryza, negative sore throat Jaw on the rt side swelling resolved Resp: negative cough, hemoptysis, dyspnea Cards: negative for chest pain, palpitations, lower extremity edema GU: negative for frequency, dysuria and hematuria GI: Negative for abdominal pain, diarrhea, bleeding, constipation Skin: negative for rash and pruritus Heme: negative for easy bruising and gum/nose bleeding FD:fldroz weakness Neurolo:negative for headaches, dizziness, vertigo, memory problems  Psych: negative for feelings of anxiety, depression  Endocrine: negative for thyroid , diabetes Allergy/Immunology- no antibiotic But many other Objective:  VITALS:  BP 117/83   Pulse 96   Temp 97.6 F (36.4 C) (Temporal)   PHYSICAL EXAM:  General: pt in wheel chair, tired- but appropriate response Nasal cannula oxygen    Eyes: Conjunctivae clear, anicteric sclerae. Pupils are equal ENT Nares normal.   1/9   12/19     12/6   12/6  Oral cavity - wound has healed well  Neck: Supple, symmetrical, no adenopathy, thyroid : non tender no carotid bruit and no JVD.  Lungs: Clear to auscultation bilaterally. No Wheezing or Rhonchi. No rales. Heart: Regular rate and rhythm, no murmur, rub or gallop. Abdomen: did not examine as in wheel chair Extremities: atraumatic, no cyanosis. No edema. No clubbing Skin: No rashes or lesions. Or bruising Lymph: Cervical, supraclavicular normal. Neurologic: Grossly non-focal Pertinent Labs Will get labs today   ? 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  Draining sinus-  debridement at [facility].culture ESBL ecoli, actinomyces and anerobes  Currently on Augmentin  2 g BID.and bactrim  DS 1 BID ) for esbl ecoli)   Much improved- the draining sinus has healed The inside wound is  healed mostly 33  days of Augmentin  and 13 days of bactrim  Continue both antibiotics Check creatinine and K  PAF on eliquis   HTN/ CHf  on frusemide, entresto , carvedilol   DM on insulin  and   ?CHB with PPM,  CAD s/p CABG  COPD B/l THA    ________________________________________________ Discussed with patient, and her friend Called her daughter in law Amber [name] on the phone and discussed the plan to continue antibiotucs and labs will be done today Follow in 4 weeks  P.s Cr 1.34 ( was 0.82 on 12/19) K is 4.6 Sed rate 40 CRP < 0.5 ( was 7)   5.00pm Called Amber [name] again in the evening and discussed lab result- as cr cl 37 will continue same dose of both antibiotics Increase water intake repeat BMP early next week and if cr going up then decrease the dose

## 2023-07-17 ENCOUNTER — Other Ambulatory Visit: Payer: Self-pay

## 2023-07-20 ENCOUNTER — Encounter: Payer: 59 | Admitting: Family

## 2023-07-23 ENCOUNTER — Other Ambulatory Visit
Admission: RE | Admit: 2023-07-23 | Discharge: 2023-07-23 | Disposition: A | Discharge status: Home / Self Care | Payer: 59 | Attending: Infectious Diseases | Admitting: Infectious Diseases

## 2023-07-23 DIAGNOSIS — M272 Inflammatory conditions of jaws: Secondary | ICD-10-CM | POA: Diagnosis present

## 2023-07-23 LAB — BASIC METABOLIC PANEL
Anion gap: 13 (ref 5–15)
BUN: 19 mg/dL (ref 8–23)
CO2: 25 mmol/L (ref 22–32)
Calcium: 10 mg/dL (ref 8.9–10.3)
Chloride: 96 mmol/L — ABNORMAL LOW (ref 98–111)
Creatinine, Ser: 1.16 mg/dL — ABNORMAL HIGH (ref 0.44–1.00)
GFR, Estimated: 45 mL/min — ABNORMAL LOW (ref >60)
Glucose, Bld: 125 mg/dL — ABNORMAL HIGH (ref 70–99)
Potassium: 5 mmol/L (ref 3.5–5.1)
Sodium: 134 mmol/L — ABNORMAL LOW (ref 135–145)

## 2023-07-30 ENCOUNTER — Other Ambulatory Visit: Payer: Self-pay

## 2023-07-30 ENCOUNTER — Telehealth: Payer: Self-pay

## 2023-07-30 DIAGNOSIS — M272 Inflammatory conditions of jaws: Secondary | ICD-10-CM

## 2023-07-30 NOTE — Telephone Encounter (Signed)
I spoke to patient's daughter and relayed results.  I also advised her patient will need labs Monday or Tues.  Annabelle Harman verbalized understanding.  Kensleigh Gates Jonathon Resides, CMA

## 2023-07-30 NOTE — Telephone Encounter (Signed)
-----   Message from Lynn Ito sent at 07/30/2023  9:40 AM EST ----- Can you let Annabelle Harman know that her cr is better than before and she should continue the same dose of meds and drink plenty of water and get another blood work on Monday or Tuesday ? Cbc /cmp'thx ----- Message ----- From: Leory Plowman, Lab In [place] Sent: 07/23/2023   9:26 AM EST To: Lynn Ito, MD

## 2023-08-03 ENCOUNTER — Other Ambulatory Visit: Payer: Self-pay | Admitting: Family

## 2023-08-07 ENCOUNTER — Other Ambulatory Visit
Admission: RE | Admit: 2023-08-07 | Discharge: 2023-08-07 | Disposition: A | Discharge status: Home / Self Care | Payer: 59 | Attending: Infectious Diseases | Admitting: Infectious Diseases

## 2023-08-07 DIAGNOSIS — M272 Inflammatory conditions of jaws: Secondary | ICD-10-CM | POA: Insufficient documentation

## 2023-08-07 LAB — CBC WITH DIFFERENTIAL/PLATELET
Abs Immature Granulocytes: 0.02 10*3/uL (ref 0.00–0.07)
Basophils Absolute: 0.1 10*3/uL (ref 0.0–0.1)
Basophils Relative: 2 %
Eosinophils Absolute: 1.2 10*3/uL — ABNORMAL HIGH (ref 0.0–0.5)
Eosinophils Relative: 21 %
HCT: 36.6 % (ref 36.0–46.0)
Hemoglobin: 11.6 g/dL — ABNORMAL LOW (ref 12.0–15.0)
Immature Granulocytes: 0 %
Lymphocytes Relative: 28 %
Lymphs Abs: 1.7 10*3/uL (ref 0.7–4.0)
MCH: 26.8 pg (ref 26.0–34.0)
MCHC: 31.7 g/dL (ref 30.0–36.0)
MCV: 84.5 fL (ref 80.0–100.0)
Monocytes Absolute: 0.6 10*3/uL (ref 0.1–1.0)
Monocytes Relative: 10 %
Neutro Abs: 2.3 10*3/uL (ref 1.7–7.7)
Neutrophils Relative %: 39 %
Platelets: 196 10*3/uL (ref 150–400)
RBC: 4.33 MIL/uL (ref 3.87–5.11)
RDW: 19.6 % — ABNORMAL HIGH (ref 11.5–15.5)
WBC: 5.9 10*3/uL (ref 4.0–10.5)
nRBC: 0 % (ref 0.0–0.2)

## 2023-08-07 LAB — COMPREHENSIVE METABOLIC PANEL
ALT: 20 U/L (ref 0–44)
AST: 24 U/L (ref 15–41)
Albumin: 3.8 g/dL (ref 3.5–5.0)
Alkaline Phosphatase: 48 U/L (ref 38–126)
Anion gap: 13 (ref 5–15)
BUN: 20 mg/dL (ref 8–23)
CO2: 24 mmol/L (ref 22–32)
Calcium: 9.8 mg/dL (ref 8.9–10.3)
Chloride: 98 mmol/L (ref 98–111)
Creatinine, Ser: 0.98 mg/dL (ref 0.44–1.00)
GFR, Estimated: 54 mL/min — ABNORMAL LOW (ref >60)
Glucose, Bld: 144 mg/dL — ABNORMAL HIGH (ref 70–99)
Potassium: 4.4 mmol/L (ref 3.5–5.1)
Sodium: 135 mmol/L (ref 135–145)
Total Bilirubin: 0.7 mg/dL (ref 0.0–1.2)
Total Protein: 7.2 g/dL (ref 6.5–8.1)

## 2023-08-18 ENCOUNTER — Ambulatory Visit: Payer: 59 | Admitting: Infectious Diseases

## 2023-08-20 ENCOUNTER — Ambulatory Visit: Payer: 59 | Admitting: Infectious Diseases

## 2023-08-24 ENCOUNTER — Other Ambulatory Visit: Payer: Self-pay

## 2023-08-25 ENCOUNTER — Other Ambulatory Visit: Payer: Self-pay

## 2023-08-27 ENCOUNTER — Ambulatory Visit: Payer: 59 | Attending: Infectious Diseases | Admitting: Infectious Diseases

## 2023-08-27 ENCOUNTER — Ambulatory Visit: Payer: 59 | Admitting: Infectious Diseases

## 2023-08-27 ENCOUNTER — Other Ambulatory Visit: Payer: Self-pay | Admitting: Infectious Diseases

## 2023-08-27 DIAGNOSIS — Z951 Presence of aortocoronary bypass graft: Secondary | ICD-10-CM | POA: Insufficient documentation

## 2023-08-27 DIAGNOSIS — I110 Hypertensive heart disease with heart failure: Secondary | ICD-10-CM | POA: Diagnosis not present

## 2023-08-27 DIAGNOSIS — I509 Heart failure, unspecified: Secondary | ICD-10-CM | POA: Insufficient documentation

## 2023-08-27 DIAGNOSIS — I480 Paroxysmal atrial fibrillation: Secondary | ICD-10-CM | POA: Insufficient documentation

## 2023-08-27 DIAGNOSIS — Z794 Long term (current) use of insulin: Secondary | ICD-10-CM | POA: Diagnosis not present

## 2023-08-27 DIAGNOSIS — M879 Osteonecrosis, unspecified: Secondary | ICD-10-CM | POA: Diagnosis present

## 2023-08-27 DIAGNOSIS — I2510 Atherosclerotic heart disease of native coronary artery without angina pectoris: Secondary | ICD-10-CM | POA: Diagnosis not present

## 2023-08-27 DIAGNOSIS — Z1612 Extended spectrum beta lactamase (ESBL) resistance: Secondary | ICD-10-CM | POA: Diagnosis not present

## 2023-08-27 DIAGNOSIS — M272 Inflammatory conditions of jaws: Secondary | ICD-10-CM | POA: Insufficient documentation

## 2023-08-27 DIAGNOSIS — B9620 Unspecified Escherichia coli [E. coli] as the cause of diseases classified elsewhere: Secondary | ICD-10-CM

## 2023-08-27 DIAGNOSIS — J449 Chronic obstructive pulmonary disease, unspecified: Secondary | ICD-10-CM | POA: Insufficient documentation

## 2023-08-27 DIAGNOSIS — E119 Type 2 diabetes mellitus without complications: Secondary | ICD-10-CM | POA: Diagnosis not present

## 2023-08-27 DIAGNOSIS — Z7901 Long term (current) use of anticoagulants: Secondary | ICD-10-CM | POA: Diagnosis not present

## 2023-08-27 MED ORDER — SULFAMETHOXAZOLE-TRIMETHOPRIM 800-160 MG PO TABS: 1.0000 | ORAL_TABLET | Freq: Two times a day (BID) | ORAL | 0 refills | Status: DC

## 2023-08-27 NOTE — Progress Notes (Signed)
 The purpose of this virtual visit is to provide medical care while limiting exposure to the novel coronavirus (COVID19) for both patient and office staff.   Consent was obtained for video visit:  Yes.   Answered questions that patient had about telehealth interaction:  Yes.   I discussed the limitations, risks, security and privacy concerns of performing an evaluation and management service by telephone. I also discussed with the patient that there may be a patient responsible charge related to this service. The patient expressed understanding and agreed to proceed.   Patient Location: Home Provider Location:office People on call- patient, patient's daughter in law and provider Follow up visit for mandible osteomyelitis  Amber [name] is a 89 y.o. with a history of HLD, PAF, COPD, HTN, DM, CHB with PPM, CAD s/p CABG , CKD, Anemia, CHF( EF 25%)b/l THA, RT TKA Was in hospital in Aug  8/2-02/08/23 with dental  pain  bottom right , loose teeth, swelling , CT showing severe odontogenic disease and treated as cellulitis with uansyn and then sent home on augmentin presented to te ED on 06/12/23 with abscess and drainage from the lower jaw Pt has had a swelling and pain rt jaw since April 2024 and seen dentist and had been on 2 courses of amoxiclllin. On  06/18/23  had debridement of the mandible and extraction of teeth  by Dr[facility]Reside at [facility] He sent culture of the bone and it was anerobes, beaded organism ( likely actino) and ESBL ecoli- the latter is intermediate to amox/clav Pt has been on Augmentin Xl 2 grams Po BID ( 10 weeks+ 5 days now) and bactrim 8 weeks She is doing very well The fistula of the mandible has healed completely Inside the mouth the wound has healed well   Past Medical History:  Diagnosis Date   Acute metabolic encephalopathy 06/20/2022   Anemia    Anxiety    Arthritis    Benign neoplasm of colon    CHF (congestive heart failure) (HCC)    Chronic kidney disease     Chronic pain    Complication of anesthesia 1980's   hard time waking up   Coronary artery disease with unspecified angina pectoris    Cough    Diabetes mellitus without complication (HCC)    Essential hypertension    GERD (gastroesophageal reflux disease)    High risk medication use    Hyperlipidemia    Hypoglycemia 06/20/2022   Myocardial injury 06/20/2022   Plantar fascial fibromatosis    Presence of permanent cardiac pacemaker    Shortness of breath dyspnea     Past Surgical History:  Procedure Laterality Date   ABDOMINAL HYSTERECTOMY     BACK SURGERY  1960's   cage and screws in lower back   CATARACT EXTRACTION W/ INTRAOCULAR LENS  IMPLANT, BILATERAL Bilateral    CORONARY ARTERY BYPASS GRAFT  2008   triple   HARDWARE REMOVAL Right 12/08/2019   Procedure: HARDWARE REMOVAL;  Surgeon: Kennedy Bucker, MD;  Location: [facility] OR[facility];  Service: Orthopedics;  Laterality: Right;   JOINT REPLACEMENT Right    hip and knee   ORIF ANKLE FRACTURE Right 07/27/2014   Procedure: OPEN REDUCTION INTERNAL FIXATION (ORIF) ANKLE FRACTURE;  Surgeon: Harvie Junior, MD;  Location: [facility] OR;  Service: Orthopedics;  Laterality: Right;   ORIF TIBIA FRACTURE Right 12/08/2019   Procedure: OPEN REDUCTION INTERNAL FIXATION (ORIF) TIBIA FRACTURE;  Surgeon: Kennedy Bucker, MD;  Location: [facility] OR[facility];  Service: Orthopedics;  Laterality: Right;  PACEMAKER INSERTION Left 03/14/2015   Procedure: INSERTION PACEMAKER;  Surgeon: Marcina Millard, MD;  Location: [facility] OR[facility];  Service: Cardiovascular;  Laterality: Left;   PACEMAKER LEADLESS INSERTION N/A 01/24/2020   Procedure: PACEMAKER LEADLESS INSERTION;  Surgeon: Marcina Millard, MD;  Location: [facility];  Service: Cardiovascular;  Laterality: N/A;   ROTATOR CUFF REPAIR Left    TOTAL HIP ARTHROPLASTY Right    TOTAL HIP ARTHROPLASTY Left 02/09/2018   Procedure: TOTAL HIP ARTHROPLASTY ANTERIOR APPROACH;  Surgeon: Kennedy Bucker, MD;  Location: [facility] OR[facility];  Service:  Orthopedics;  Laterality: Left;   TOTAL KNEE ARTHROPLASTY Right    VEIN LIGATION AND STRIPPING      Social History   Socioeconomic History   Marital status: Widowed    Spouse name: Not on file   Number of children: Not on file   Years of education: Not on file   Highest education level: Not on file  Occupational History   Not on file  Tobacco Use   Smoking status: Never   Smokeless tobacco: Never  Vaping Use   Vaping status: Never Used  Substance and Sexual Activity   Alcohol use: No   Drug use: No   Sexual activity: Not on file  Other Topics Concern   Not on file  Social History Narrative   Not on file   Social Drivers of Health   Financial Resource Strain: Low Risk  (08/25/2023)   Received [facility]   Overall Financial Resource Strain (CARDIA)    Difficulty of Paying Living Expenses: Not hard at all  Food Insecurity: No Food Insecurity (08/25/2023)   Received from [facility]   Hunger Vital Sign    Worried About Running Out of Food in the Last Year: Never true    Ran Out of Food in the Last Year: Never true  Transportation Needs: No Transportation Needs (08/25/2023)   Received from [facility] - Transportation    In the past 12 months, has lack of transportation kept you from medical appointments or from getting medications?: No    Lack of Transportation (Non-Medical): No  Physical Activity: Not on file  Stress: Not on file  Social Connections: Not on file  Intimate Partner Violence: Not At Risk (06/13/2023)   Humiliation, Afraid, Rape, and Kick questionnaire    Fear of Current or Ex-Partner: No    Emotionally Abused: No    Physically Abused: No    Sexually Abused: No    Family History  Problem Relation Age of Onset   Heart attack Mother    Heart disease Father    Alzheimer's disease Sister    Cervical cancer Sister    Heart failure Son    Allergies  Allergen Reactions   Ambien  [Zolpidem Tartrate] Other (See Comments)    Reaction:  Keeps pt awake    Iodine Itching   Succinylcholine Other (See Comments)    Reaction:  Unknown    Etodolac Other (See Comments)    GI upset   Nsaids Other (See Comments) and Nausea And Vomiting    Reaction: gi upset  Takes BC powders   I? Current Outpatient Medications  Medication Sig Dispense Refill   acetaminophen (TYLENOL) 325 MG tablet Take 2 tablets (650 mg total) by mouth every 6 (six) hours as needed for mild pain (or Fever >/= 101). 20 tablet 0   albuterol (PROVENTIL HFA;VENTOLIN HFA) 108 (90 Base) MCG/ACT inhaler Inhale 2 puffs into  the lungs every 6 (six) hours as needed for wheezing or shortness of breath. 1 Inhaler 0   amoxicillin-clavulanate (AUGMENTIN XR) 1000-62.5 MG 12 hr tablet Take 2 tablets by mouth 2 (two) times daily. 120 tablet 1   apixaban (ELIQUIS) 5 MG TABS tablet Take 5 mg by mouth 2 (two) times daily.     calcium-vitamin D (OSCAL WITH D) 500-200 MG-UNIT tablet Take 1 tablet by mouth every other day.     carvedilol (COREG) 6.25 MG tablet Take 1 tablet (6.25 mg total) by mouth 2 (two) times daily with a meal. 60 tablet 0   empagliflozin (JARDIANCE) 10 MG TABS tablet Take 10 mg by mouth daily.     ezetimibe (ZETIA) 10 MG tablet Take 1 tablet by mouth daily.     furosemide (LASIX) 20 MG tablet Take 1 tablet (20 mg total) by mouth 2 (two) times daily. (Patient taking differently: Take 20 mg by mouth daily.) 60 tablet 0   gabapentin (NEURONTIN) 100 MG capsule Take 200 mg by mouth 3 (three) times daily.     HYDROcodone-acetaminophen (NORCO/VICODIN) 5-325 MG tablet Take 1 tablet by mouth every 4 (four) hours as needed for moderate pain (pain score 4-6). 15 tablet 0   insulin glargine (LANTUS) 100 UNIT/ML injection Inject 0.05 mLs (5 Units total) into the skin daily. Hold if blood sugar less than 120 or if not eating well. 10 mL 0   ipratropium-albuterol (DUONEB) 0.5-2.5 (3) MG/3ML SOLN Take 3 mLs by nebulization 4  (four) times daily.     Multiple Vitamins-Minerals (PRESERVISION AREDS 2) CAPS Take 1 capsule by mouth every other day.     omeprazole (PRILOSEC) 40 MG capsule Take 40 mg by mouth daily.     polyethylene glycol (MIRALAX / GLYCOLAX) 17 g packet Take 17 g by mouth daily as needed for mild constipation. 14 each 0   pravastatin (PRAVACHOL) 40 MG tablet Take 40 mg by mouth at bedtime.     sacubitril-valsartan (ENTRESTO) 24-26 MG Take 1 tablet by mouth twice daily 60 tablet 3   sulfamethoxazole-trimethoprim (BACTRIM DS) 800-160 MG tablet Take 1 tablet by mouth 2 (two) times daily. 60 tablet 1   No current facility-administered medications for this visit.     Abtx:  Anti-infectives (From admission, onward)    None       REVIEW OF SYSTEMS:  Const: negative fever, negative chills, negative weight loss Eyes: negative diplopia or visual changes, negative eye pain ENT: negative coryza, negative sore throat Jaw on the rt side swelling resolved Resp: negative cough, hemoptysis, dyspnea Cards: negative for chest pain, palpitations, lower extremity edema GU: negative for frequency, dysuria and hematuria GI: Negative for abdominal pain, diarrhea, bleeding, constipation Skin: negative for rash and pruritus Heme: negative for easy bruising and gum/nose bleeding MW:UXLKGM weakness Neurolo:negative for headaches, dizziness, vertigo, memory problems  Psych: negative for feelings of anxiety, depression  Endocrine: negative for thyroid, diabetes Allergy/Immunology- no antibiotic But many other Objective:  VITALS:  There were no vitals taken for this visit.  PHYSICAL EXAM:  General: pt is at home- no distress Nasal cannula oxygen     1/9   12/19     12/6   12/6   Labs    Latest Ref Rng & Units 08/07/2023   10:03 AM 07/16/2023   10:52 AM 06/25/2023   12:20 PM  CBC  WBC 4.0 - 10.5 K/uL 5.9  5.0  7.1   Hemoglobin 12.0 - 15.0 g/dL 01.0  27.2  9.6  Hematocrit 36.0 - 46.0 % 36.6   32.0  32.3   Platelets 150 - 400 K/uL 196  255  306        Latest Ref Rng & Units 08/07/2023   10:03 AM 07/23/2023    9:07 AM 07/16/2023   10:52 AM  CMP  Glucose 70 - 99 mg/dL 629  528  413   BUN 8 - 23 mg/dL 20  19  25    Creatinine 0.44 - 1.00 mg/dL 2.44  0.10  2.72   Sodium 135 - 145 mmol/L 135  134  133   Potassium 3.5 - 5.1 mmol/L 4.4  5.0  4.6   Chloride 98 - 111 mmol/L 98  96  99   CO2 22 - 32 mmol/L 24  25  24    Calcium 8.9 - 10.3 mg/dL 9.8  53.6  9.2   Total Protein 6.5 - 8.1 g/dL 7.2   6.9   Total Bilirubin 0.0 - 1.2 mg/dL 0.7   0.2   Alkaline Phos 38 - 126 U/L 48   50   AST 15 - 41 U/L 24   28   ALT 0 - 44 U/L 20   18      ? 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  Draining sinus-  debridement at [facility].culture ESBL ecoli, actinomyces and anerobes  Currently on Augmentin 2 g BID.11 th wek- she will continue for 1 week and stop on 09/04/23 and bactrim DS 1 BID week 8   for esbl ecoli - she will take for 2-4 more weeks    the draining sinus has healed Last cr and K on 08/07/23 was N Will repeat  creatinine and K when she cane come to get her labs  PAF on eliquis  HTN/ CHf  on frusemide, entresto, carvedilol  DM on insulin and   ?CHB with PPM,  CAD s/p CABG  COPD B/l THA    ________________________________________________ Discussed with patient, and her daughter in law Total time spent is 25 min Follow PRN

## 2023-11-30 ENCOUNTER — Other Ambulatory Visit: Payer: Self-pay | Admitting: Family

## 2024-03-10 ENCOUNTER — Telehealth: Payer: Self-pay | Admitting: Family

## 2024-03-10 NOTE — Telephone Encounter (Signed)
 Called to confirm/remind patient of their appointment [facility] on 03/11/24.   Appointment:   [x] Confirmed  [] Left mess   [] No answer/No voice mail  [] VM Full/unable to leave message  [] Phone not in service  Patient reminded to bring all medications and/or complete list.  Confirmed patient has transportation. Gave directions, instructed to utilize valet parking.

## 2024-03-10 NOTE — Progress Notes (Unsigned)
 Advanced Heart Failure Clinic Note   Referring Physician: PCP: [name], MD PCP-Cardiologist: None   Chief Complaint:    HPI:  Ms Mcdonnell is a 89 y/o female with a history of hyperlipidemia, PAF, COPD, HTN, DM, CHB w/ pacemaker, CAD (3 vessel CABG 2008), chronic pain, CKD, arthritis, anxiety, anemia and chronic heart failure.   Admitted 06/20/22 due to acute on chronic HF. Found to be hypoglycemic. Discharged after 3 days.   Echo 07/27/16: EF of 35-40% along with moderate MR/TR. This is essentially unchanged from a previous echo done August 2017. Echo 01/27/17: EF of 40-45% along with mild MR.  Echo 12/06/19: EF of 55-60% along with mild LVH, moderate LAE and mild MR. Echo 02/06/22: EF of 25% along with moderate LVH.    She presents today for a HF follow-up visit with a chief complaint of chronic fatigue with little exertion. Has associated, cough and weight loss along with this. Denies shortness of breath, chest pain, palpitations, abdominal distention, pedal edema or weight gain. Has quite a bit of teeth pain and is planning to get all her teeth pulled hopefully in the next few months. She says that between the heat/ humidity and her teeth pain, she is not eating as much as she was previously.   Did bring her blood pressure and glucose readings to her appointment today.   Has a pacemaker for her CHB: [employer] AV [employer]SCATH PACING SYS Model/Cat number: [id number] Serial number: [id number] E Manufacturer: [employer]     Review of Systems: [y] = yes, [ ]  = no   General: Weight gain [ ] ; Weight loss [ ] ; Anorexia [ ] ; Fatigue [ ] ; Fever [ ] ; Chills [ ] ; Weakness [ ]   Cardiac: Chest pain/pressure [ ] ; Resting SOB [ ] ; Exertional SOB [ ] ; Orthopnea [ ] ; Pedal Edema [ ] ; Palpitations [ ] ; Syncope [ ] ; Presyncope [ ] ; Paroxysmal nocturnal dyspnea[ ]   Pulmonary: Cough [ ] ; Wheezing[ ] ; Hemoptysis[ ] ; Sputum [ ] ; Snoring [ ]   GI: Vomiting[ ] ; Dysphagia[ ] ; Melena[ ] ;  Hematochezia [ ] ; Heartburn[ ] ; Abdominal pain [ ] ; Constipation [ ] ; Diarrhea [ ] ; BRBPR [ ]   GU: Hematuria[ ] ; Dysuria [ ] ; Nocturia[ ]   Vascular: Pain in legs with walking [ ] ; Pain in feet with lying flat [ ] ; Non-healing sores [ ] ; Stroke [ ] ; TIA [ ] ; Slurred speech [ ] ;  Neuro: Headaches[ ] ; Vertigo[ ] ; Seizures[ ] ; Paresthesias[ ] ;Blurred vision [ ] ; Diplopia [ ] ; Vision changes [ ]   Ortho/Skin: Arthritis [ ] ; Joint pain [ ] ; Muscle pain [ ] ; Joint swelling [ ] ; Back Pain [ ] ; Rash [ ]   Psych: Depression[ ] ; Anxiety[ ]   Heme: Bleeding problems [ ] ; Clotting disorders [ ] ; Anemia [ ]   Endocrine: Diabetes [ ] ; Thyroid  dysfunction[ ]    Past Medical History:  Diagnosis Date   Acute metabolic encephalopathy 06/20/2022   Anemia    Anxiety    Arthritis    Benign neoplasm of colon    CHF (congestive heart failure) (HCC)    Chronic kidney disease    Chronic pain    Complication of anesthesia 1980's   hard time waking up   Coronary artery disease with unspecified angina pectoris    Cough    Diabetes mellitus without complication (HCC)    Essential hypertension    GERD (gastroesophageal reflux disease)    High risk medication use    Hyperlipidemia    Hypoglycemia 06/20/2022   Myocardial injury  06/20/2022   Plantar fascial fibromatosis    Presence of permanent cardiac pacemaker    Shortness of breath dyspnea     Current Outpatient Medications  Medication Sig Dispense Refill   acetaminophen  (TYLENOL ) 325 MG tablet Take 2 tablets (650 mg total) by mouth every 6 (six) hours as needed for mild pain (or Fever >/= 101). 20 tablet 0   albuterol  (PROVENTIL  HFA;VENTOLIN  HFA) 108 (90 Base) MCG/ACT inhaler Inhale 2 puffs into the lungs every 6 (six) hours as needed for wheezing or shortness of breath. 1 Inhaler 0   amoxicillin -clavulanate (AUGMENTIN  XR) 1000-62.5 MG 12 hr tablet Take 2 tablets by mouth 2 (two) times daily. 120 tablet 1   apixaban  (ELIQUIS ) 5 MG TABS tablet Take 5 mg by  mouth 2 (two) times daily.     calcium -vitamin D  (OSCAL WITH D) 500-200 MG-UNIT tablet Take 1 tablet by mouth every other day.     carvedilol  (COREG ) 6.25 MG tablet Take 1 tablet (6.25 mg total) by mouth 2 (two) times daily with a meal. 60 tablet 0   empagliflozin  (JARDIANCE ) 10 MG TABS tablet Take 10 mg by mouth daily.     ezetimibe  (ZETIA ) 10 MG tablet Take 1 tablet by mouth daily.     furosemide  (LASIX ) 20 MG tablet Take 1 tablet (20 mg total) by mouth 2 (two) times daily. (Patient taking differently: Take 20 mg by mouth daily.) 60 tablet 0   gabapentin  (NEURONTIN ) 100 MG capsule Take 200 mg by mouth 3 (three) times daily.     HYDROcodone -acetaminophen  (NORCO/VICODIN) 5-325 MG tablet Take 1 tablet by mouth every 4 (four) hours as needed for moderate pain (pain score 4-6). 15 tablet 0   insulin  glargine (LANTUS ) 100 UNIT/ML injection Inject 0.05 mLs (5 Units total) into the skin daily. Hold if blood sugar less than 120 or if not eating well. 10 mL 0   ipratropium-albuterol  (DUONEB) 0.5-2.5 (3) MG/3ML SOLN Take 3 mLs by nebulization 4 (four) times daily.     Multiple Vitamins-Minerals (PRESERVISION AREDS 2) CAPS Take 1 capsule by mouth every other day.     omeprazole (PRILOSEC) 40 MG capsule Take 40 mg by mouth daily.     polyethylene glycol (MIRALAX  / GLYCOLAX ) 17 g packet Take 17 g by mouth daily as needed for mild constipation. 14 each 0   pravastatin  (PRAVACHOL ) 40 MG tablet Take 40 mg by mouth at bedtime.     sacubitril -valsartan  (ENTRESTO ) 24-26 MG Take 1 tablet by mouth twice daily 60 tablet 5   sulfamethoxazole -trimethoprim  (BACTRIM  DS) 800-160 MG tablet Take 1 tablet by mouth 2 (two) times daily. 30 tablet 0   No current facility-administered medications for this visit.    Allergies  Allergen Reactions   Ambien [Zolpidem Tartrate] Other (See Comments)    Reaction:  Keeps pt awake    Iodine Itching   Succinylcholine Other (See Comments)    Reaction:  Unknown    Etodolac Other (See  Comments)    GI upset   Nsaids Other (See Comments) and Nausea And Vomiting    Reaction: gi upset  Takes BC powders      Social History   Socioeconomic History   Marital status: Widowed    Spouse name: Not on file   Number of children: Not on file   Years of education: Not on file   Highest education level: Not on file  Occupational History   Not on file  Tobacco Use   Smoking status: Never   Smokeless  tobacco: Never  Vaping Use   Vaping status: Never Used  Substance and Sexual Activity   Alcohol use: No   Drug use: No   Sexual activity: Not on file  Other Topics Concern   Not on file  Social History Narrative   Not on file   Social Drivers of Health   Financial Resource Strain: Low Risk  (08/25/2023)   Received from [facility]   Overall Financial Resource Strain (CARDIA)    Difficulty of Paying Living Expenses: Not hard at all  Food Insecurity: No Food Insecurity (08/25/2023)   Received [facility]   Hunger Vital Sign    Within the past 12 months, you worried that your food would run out before you got the money to buy more.: Never true    Within the past 12 months, the food you bought just didn't last and you didn't have money to get more.: Never true  Transportation Needs: No Transportation Needs (08/25/2023)   Received from [facility] - Transportation    In the past 12 months, has lack of transportation kept you from medical appointments or from getting medications?: No    Lack of Transportation (Non-Medical): No  Physical Activity: Not on file  Stress: Not on file  Social Connections: Not on file  Intimate Partner Violence: Not At Risk (06/13/2023)   Humiliation, Afraid, Rape, and Kick questionnaire    Fear of Current or Ex-Partner: No    Emotionally Abused: No    Physically Abused: No    Sexually Abused: No      Family History  Problem Relation Age of Onset   Heart attack Mother     Heart disease Father    Alzheimer's disease Sister    Cervical cancer Sister    Heart failure Son     There were no vitals filed for this visit.   PHYSICAL EXAM: General:  Well appearing. No respiratory difficulty HEENT: normal Neck: supple. no JVD. Carotids 2+ bilat; no bruits. No lymphadenopathy or thyromegaly appreciated. Cor: PMI nondisplaced. Regular rate & rhythm. No rubs, gallops or murmurs. Lungs: clear Abdomen: soft, nontender, nondistended. No hepatosplenomegaly. No bruits or masses. Good bowel sounds. Extremities: no cyanosis, clubbing, rash, edema Neuro: alert & oriented x 3, cranial nerves grossly intact. moves all 4 extremities w/o difficulty. Affect pleasant.  ECG:   ASSESSMENT & PLAN:  1: Ischemic heart failure with reduced ejection fraction- - CABG 2008 - NYHA class III - euvolemic - weighing daily; reminded to  call for an overnight weight gain of >2 pounds or a weekly weight gain of >5 pounds.  - weight down 2 pounds from last visit here 3 months ago - not adding salt to her food and is trying to eat low sodium foods  - continue jardiance  10mg  daily - continue entresto  24/26mg  BID - continue carvedilol  6.25mg  BID - continue furosemide  20mg  daily - will not add spiro as she gets some low BP readings at home and patient is 89 y/o so don't want to run the risk of falls - BNP 06/20/22 was 1239.9  2: HTN- - BP 135/74 - home BP log reviewed and shows SBP 132-93 and DBP 87-46 with occasional BP of 89/54, 95/56, 88/52 - saw PCP ([facility]) 06/24 - BMP 10/21/22 reviewed and showed sodium 136, potassium 4.1, creatinine 0.84 and GFR >60  3: DM- - A1c 10/17/22 was 7.2% - glucose at home this morning was 139; ranging from  100-210 over the last 6 weeks - saw endocrinology Leatrice) 04/24  4: CAD- - 3 vessel CABG 2008 - saw cardiology Cloretta) 08/23; returns 09/24 - lipid panel 06/21/22: LDL 119, triglycerides 90  5: COPD- - saw pulmonology Alica) 06/24 -  wearing oxygen  @ 3 L  Return in 6 months, sooner if needed.    Ellouise DELENA Class, FNP 03/10/24

## 2024-03-11 ENCOUNTER — Encounter: Payer: Self-pay | Admitting: Family

## 2024-03-11 ENCOUNTER — Ambulatory Visit (HOSPITAL_BASED_OUTPATIENT_CLINIC_OR_DEPARTMENT_OTHER): Admitting: Family

## 2024-03-11 VITALS — BP 128/66 | HR 61 | Wt 155.4 lb

## 2024-03-11 DIAGNOSIS — J449 Chronic obstructive pulmonary disease, unspecified: Secondary | ICD-10-CM | POA: Insufficient documentation

## 2024-03-11 DIAGNOSIS — R350 Frequency of micturition: Secondary | ICD-10-CM | POA: Insufficient documentation

## 2024-03-11 DIAGNOSIS — I5022 Chronic systolic (congestive) heart failure: Secondary | ICD-10-CM | POA: Diagnosis not present

## 2024-03-11 DIAGNOSIS — Z79899 Other long term (current) drug therapy: Secondary | ICD-10-CM | POA: Insufficient documentation

## 2024-03-11 DIAGNOSIS — S72115A Nondisplaced fracture of greater trochanter of left femur, initial encounter for closed fracture: Secondary | ICD-10-CM | POA: Diagnosis not present

## 2024-03-11 DIAGNOSIS — E1122 Type 2 diabetes mellitus with diabetic chronic kidney disease: Secondary | ICD-10-CM | POA: Insufficient documentation

## 2024-03-11 DIAGNOSIS — G8929 Other chronic pain: Secondary | ICD-10-CM | POA: Insufficient documentation

## 2024-03-11 DIAGNOSIS — Z9981 Dependence on supplemental oxygen: Secondary | ICD-10-CM | POA: Insufficient documentation

## 2024-03-11 DIAGNOSIS — Z7984 Long term (current) use of oral hypoglycemic drugs: Secondary | ICD-10-CM | POA: Insufficient documentation

## 2024-03-11 DIAGNOSIS — Z951 Presence of aortocoronary bypass graft: Secondary | ICD-10-CM | POA: Insufficient documentation

## 2024-03-11 DIAGNOSIS — I480 Paroxysmal atrial fibrillation: Secondary | ICD-10-CM | POA: Insufficient documentation

## 2024-03-11 DIAGNOSIS — I130 Hypertensive heart and chronic kidney disease with heart failure and stage 1 through stage 4 chronic kidney disease, or unspecified chronic kidney disease: Secondary | ICD-10-CM | POA: Insufficient documentation

## 2024-03-11 DIAGNOSIS — E119 Type 2 diabetes mellitus without complications: Secondary | ICD-10-CM | POA: Diagnosis not present

## 2024-03-11 DIAGNOSIS — M272 Inflammatory conditions of jaws: Secondary | ICD-10-CM

## 2024-03-11 DIAGNOSIS — N189 Chronic kidney disease, unspecified: Secondary | ICD-10-CM | POA: Insufficient documentation

## 2024-03-11 DIAGNOSIS — M1990 Unspecified osteoarthritis, unspecified site: Secondary | ICD-10-CM | POA: Insufficient documentation

## 2024-03-11 DIAGNOSIS — E785 Hyperlipidemia, unspecified: Secondary | ICD-10-CM | POA: Insufficient documentation

## 2024-03-11 DIAGNOSIS — I2510 Atherosclerotic heart disease of native coronary artery without angina pectoris: Secondary | ICD-10-CM | POA: Diagnosis not present

## 2024-03-11 DIAGNOSIS — I10 Essential (primary) hypertension: Secondary | ICD-10-CM

## 2024-03-11 DIAGNOSIS — Z794 Long term (current) use of insulin: Secondary | ICD-10-CM | POA: Insufficient documentation

## 2024-03-11 DIAGNOSIS — S72009A Fracture of unspecified part of neck of unspecified femur, initial encounter for closed fracture: Secondary | ICD-10-CM | POA: Diagnosis not present

## 2024-03-11 DIAGNOSIS — E1169 Type 2 diabetes mellitus with other specified complication: Secondary | ICD-10-CM | POA: Insufficient documentation

## 2024-03-11 NOTE — Patient Instructions (Signed)
It was good to see you today!   Call us in the future if you need [place] for anything.

## 2024-03-13 ENCOUNTER — Emergency Department

## 2024-03-13 ENCOUNTER — Inpatient Hospital Stay
Admission: EM | Admit: 2024-03-13 | Discharge: 2024-03-16 | DRG: 536 | Disposition: A | Discharge status: Skilled Nursing Facility | Attending: Internal Medicine | Admitting: Internal Medicine

## 2024-03-13 ENCOUNTER — Other Ambulatory Visit: Payer: Self-pay

## 2024-03-13 DIAGNOSIS — W06XXXA Fall from bed, initial encounter: Secondary | ICD-10-CM | POA: Diagnosis present

## 2024-03-13 DIAGNOSIS — J9611 Chronic respiratory failure with hypoxia: Secondary | ICD-10-CM | POA: Diagnosis present

## 2024-03-13 DIAGNOSIS — Z96641 Presence of right artificial hip joint: Secondary | ICD-10-CM | POA: Diagnosis present

## 2024-03-13 DIAGNOSIS — J449 Chronic obstructive pulmonary disease, unspecified: Secondary | ICD-10-CM | POA: Diagnosis present

## 2024-03-13 DIAGNOSIS — Z8249 Family history of ischemic heart disease and other diseases of the circulatory system: Secondary | ICD-10-CM

## 2024-03-13 DIAGNOSIS — E785 Hyperlipidemia, unspecified: Secondary | ICD-10-CM | POA: Diagnosis present

## 2024-03-13 DIAGNOSIS — Z950 Presence of cardiac pacemaker: Secondary | ICD-10-CM

## 2024-03-13 DIAGNOSIS — Z9842 Cataract extraction status, left eye: Secondary | ICD-10-CM

## 2024-03-13 DIAGNOSIS — Z888 Allergy status to other drugs, medicaments and biological substances status: Secondary | ICD-10-CM

## 2024-03-13 DIAGNOSIS — M1612 Unilateral primary osteoarthritis, left hip: Secondary | ICD-10-CM | POA: Diagnosis present

## 2024-03-13 DIAGNOSIS — Z9841 Cataract extraction status, right eye: Secondary | ICD-10-CM

## 2024-03-13 DIAGNOSIS — K219 Gastro-esophageal reflux disease without esophagitis: Secondary | ICD-10-CM | POA: Diagnosis present

## 2024-03-13 DIAGNOSIS — Z820 Family history of epilepsy and other diseases of the nervous system: Secondary | ICD-10-CM

## 2024-03-13 DIAGNOSIS — Z6836 Body mass index (BMI) 36.0-36.9, adult: Secondary | ICD-10-CM

## 2024-03-13 DIAGNOSIS — E669 Obesity, unspecified: Secondary | ICD-10-CM | POA: Diagnosis not present

## 2024-03-13 DIAGNOSIS — I2510 Atherosclerotic heart disease of native coronary artery without angina pectoris: Secondary | ICD-10-CM | POA: Diagnosis present

## 2024-03-13 DIAGNOSIS — Z66 Do not resuscitate: Secondary | ICD-10-CM | POA: Diagnosis present

## 2024-03-13 DIAGNOSIS — M8738 Other secondary osteonecrosis, other site: Secondary | ICD-10-CM | POA: Diagnosis present

## 2024-03-13 DIAGNOSIS — S72009A Fracture of unspecified part of neck of unspecified femur, initial encounter for closed fracture: Secondary | ICD-10-CM | POA: Diagnosis present

## 2024-03-13 DIAGNOSIS — E1169 Type 2 diabetes mellitus with other specified complication: Secondary | ICD-10-CM | POA: Diagnosis present

## 2024-03-13 DIAGNOSIS — M9702XA Periprosthetic fracture around internal prosthetic left hip joint, initial encounter: Secondary | ICD-10-CM | POA: Diagnosis present

## 2024-03-13 DIAGNOSIS — Z794 Long term (current) use of insulin: Secondary | ICD-10-CM | POA: Diagnosis not present

## 2024-03-13 DIAGNOSIS — R633 Feeding difficulties, unspecified: Secondary | ICD-10-CM | POA: Diagnosis present

## 2024-03-13 DIAGNOSIS — E1165 Type 2 diabetes mellitus with hyperglycemia: Secondary | ICD-10-CM | POA: Diagnosis present

## 2024-03-13 DIAGNOSIS — E782 Mixed hyperlipidemia: Secondary | ICD-10-CM | POA: Diagnosis present

## 2024-03-13 DIAGNOSIS — N1831 Chronic kidney disease, stage 3a: Secondary | ICD-10-CM | POA: Diagnosis present

## 2024-03-13 DIAGNOSIS — Z7984 Long term (current) use of oral hypoglycemic drugs: Secondary | ICD-10-CM

## 2024-03-13 DIAGNOSIS — Z993 Dependence on wheelchair: Secondary | ICD-10-CM

## 2024-03-13 DIAGNOSIS — I5022 Chronic systolic (congestive) heart failure: Secondary | ICD-10-CM | POA: Diagnosis present

## 2024-03-13 DIAGNOSIS — Z91148 Patient's other noncompliance with medication regimen for other reason: Secondary | ICD-10-CM

## 2024-03-13 DIAGNOSIS — Z7983 Long term (current) use of bisphosphonates: Secondary | ICD-10-CM | POA: Diagnosis not present

## 2024-03-13 DIAGNOSIS — Z7901 Long term (current) use of anticoagulants: Secondary | ICD-10-CM

## 2024-03-13 DIAGNOSIS — Z951 Presence of aortocoronary bypass graft: Secondary | ICD-10-CM

## 2024-03-13 DIAGNOSIS — Z860100 Personal history of colon polyps, unspecified: Secondary | ICD-10-CM

## 2024-03-13 DIAGNOSIS — S72002A Fracture of unspecified part of neck of left femur, initial encounter for closed fracture: Secondary | ICD-10-CM

## 2024-03-13 DIAGNOSIS — Z96651 Presence of right artificial knee joint: Secondary | ICD-10-CM | POA: Diagnosis present

## 2024-03-13 DIAGNOSIS — D649 Anemia, unspecified: Secondary | ICD-10-CM | POA: Diagnosis present

## 2024-03-13 DIAGNOSIS — Z91048 Other nonmedicinal substance allergy status: Secondary | ICD-10-CM

## 2024-03-13 DIAGNOSIS — Z9981 Dependence on supplemental oxygen: Secondary | ICD-10-CM

## 2024-03-13 DIAGNOSIS — Z8049 Family history of malignant neoplasm of other genital organs: Secondary | ICD-10-CM

## 2024-03-13 DIAGNOSIS — I480 Paroxysmal atrial fibrillation: Secondary | ICD-10-CM | POA: Diagnosis present

## 2024-03-13 DIAGNOSIS — Z90710 Acquired absence of both cervix and uterus: Secondary | ICD-10-CM

## 2024-03-13 DIAGNOSIS — E1122 Type 2 diabetes mellitus with diabetic chronic kidney disease: Secondary | ICD-10-CM | POA: Diagnosis present

## 2024-03-13 DIAGNOSIS — M9702XS Periprosthetic fracture around internal prosthetic left hip joint, sequela: Secondary | ICD-10-CM | POA: Diagnosis not present

## 2024-03-13 DIAGNOSIS — T458X6A Underdosing of other primarily systemic and hematological agents, initial encounter: Secondary | ICD-10-CM | POA: Diagnosis present

## 2024-03-13 DIAGNOSIS — Z79899 Other long term (current) drug therapy: Secondary | ICD-10-CM

## 2024-03-13 DIAGNOSIS — Z886 Allergy status to analgesic agent status: Secondary | ICD-10-CM

## 2024-03-13 DIAGNOSIS — S72115A Nondisplaced fracture of greater trochanter of left femur, initial encounter for closed fracture: Secondary | ICD-10-CM | POA: Diagnosis present

## 2024-03-13 DIAGNOSIS — S72002D Fracture of unspecified part of neck of left femur, subsequent encounter for closed fracture with routine healing: Secondary | ICD-10-CM | POA: Diagnosis not present

## 2024-03-13 DIAGNOSIS — Z961 Presence of intraocular lens: Secondary | ICD-10-CM | POA: Diagnosis present

## 2024-03-13 DIAGNOSIS — I130 Hypertensive heart and chronic kidney disease with heart failure and stage 1 through stage 4 chronic kidney disease, or unspecified chronic kidney disease: Secondary | ICD-10-CM | POA: Diagnosis present

## 2024-03-13 DIAGNOSIS — J439 Emphysema, unspecified: Secondary | ICD-10-CM | POA: Diagnosis not present

## 2024-03-13 DIAGNOSIS — Z880 Allergy status to penicillin: Secondary | ICD-10-CM

## 2024-03-13 LAB — CBC WITH DIFFERENTIAL/PLATELET
Abs Immature Granulocytes: 0.03 K/uL (ref 0.00–0.07)
Basophils Absolute: 0.1 K/uL (ref 0.0–0.1)
Basophils Relative: 1 %
Eosinophils Absolute: 0.2 K/uL (ref 0.0–0.5)
Eosinophils Relative: 2 %
HCT: 32.2 % — ABNORMAL LOW (ref 36.0–46.0)
Hemoglobin: 9.4 g/dL — ABNORMAL LOW (ref 12.0–15.0)
Immature Granulocytes: 0 %
Lymphocytes Relative: 11 %
Lymphs Abs: 0.8 K/uL (ref 0.7–4.0)
MCH: 20.7 pg — ABNORMAL LOW (ref 26.0–34.0)
MCHC: 29.2 g/dL — ABNORMAL LOW (ref 30.0–36.0)
MCV: 70.9 fL — ABNORMAL LOW (ref 80.0–100.0)
Monocytes Absolute: 0.8 K/uL (ref 0.1–1.0)
Monocytes Relative: 11 %
Neutro Abs: 5.4 K/uL (ref 1.7–7.7)
Neutrophils Relative %: 75 %
Platelets: 166 K/uL (ref 150–400)
RBC: 4.54 MIL/uL (ref 3.87–5.11)
RDW: 21.5 % — ABNORMAL HIGH (ref 11.5–15.5)
Smear Review: NORMAL
WBC: 7.3 K/uL (ref 4.0–10.5)
nRBC: 0 % (ref 0.0–0.2)

## 2024-03-13 LAB — TROPONIN I (HIGH SENSITIVITY)
Troponin I (High Sensitivity): 23 ng/L — ABNORMAL HIGH (ref <18)
Troponin I (High Sensitivity): 25 ng/L — ABNORMAL HIGH (ref <18)

## 2024-03-13 LAB — BASIC METABOLIC PANEL WITH GFR
Anion gap: 13 (ref 5–15)
BUN: 22 mg/dL (ref 8–23)
CO2: 27 mmol/L (ref 22–32)
Calcium: 9.2 mg/dL (ref 8.9–10.3)
Chloride: 99 mmol/L (ref 98–111)
Creatinine, Ser: 0.75 mg/dL (ref 0.44–1.00)
GFR, Estimated: >60 mL/min (ref >60)
Glucose, Bld: 136 mg/dL — ABNORMAL HIGH (ref 70–99)
Potassium: 3.8 mmol/L (ref 3.5–5.1)
Sodium: 139 mmol/L (ref 135–145)

## 2024-03-13 LAB — GLUCOSE, CAPILLARY: Glucose-Capillary: 121 mg/dL — ABNORMAL HIGH (ref 70–99)

## 2024-03-13 LAB — CBG MONITORING, ED: Glucose-Capillary: 131 mg/dL — ABNORMAL HIGH (ref 70–99)

## 2024-03-13 LAB — HEMOGLOBIN A1C
Hgb A1c MFr Bld: 7.9 % — ABNORMAL HIGH (ref 4.8–5.6)
Mean Plasma Glucose: 180.03 mg/dL

## 2024-03-13 MED ORDER — HYDROCODONE-ACETAMINOPHEN 5-325 MG PO TABS
1.0000 | ORAL_TABLET | Freq: Four times a day (QID) | ORAL | Status: DC | PRN
  Administered 2024-03-13: 2 via ORAL
  Administered 2024-03-15: 1 via ORAL
  Filled 2024-03-13 (×2): qty 2
  Filled 2024-03-13: qty 1

## 2024-03-13 MED ORDER — HYDROMORPHONE HCL 1 MG/ML IJ SOLN: 0.5000 mg | INTRAMUSCULAR | Status: DC | PRN

## 2024-03-13 MED ORDER — POLYETHYLENE GLYCOL 3350 17 G PO PACK: 17.0000 g | PACK | Freq: Every day | ORAL | Status: DC | PRN

## 2024-03-13 MED ORDER — APIXABAN 5 MG PO TABS
5.0000 mg | ORAL_TABLET | Freq: Two times a day (BID) | ORAL | Status: DC
  Administered 2024-03-14 – 2024-03-16 (×6): 5 mg via ORAL
  Filled 2024-03-13 (×6): qty 1

## 2024-03-13 MED ORDER — INFLUENZA VAC SPLIT HIGH-DOSE 0.5 ML IM SUSY
0.5000 mL | PREFILLED_SYRINGE | INTRAMUSCULAR | Status: DC
  Filled 2024-03-13: qty 0.5

## 2024-03-13 MED ORDER — SACUBITRIL-VALSARTAN 24-26 MG PO TABS
1.0000 | ORAL_TABLET | Freq: Two times a day (BID) | ORAL | Status: DC
  Administered 2024-03-14 – 2024-03-16 (×6): 1 via ORAL
  Filled 2024-03-13 (×8): qty 1

## 2024-03-13 MED ORDER — EZETIMIBE 10 MG PO TABS
10.0000 mg | ORAL_TABLET | Freq: Every day | ORAL | Status: DC
  Administered 2024-03-14 – 2024-03-16 (×3): 10 mg via ORAL
  Filled 2024-03-13 (×3): qty 1

## 2024-03-13 MED ORDER — ALBUTEROL SULFATE (2.5 MG/3ML) 0.083% IN NEBU: 3.0000 mL | INHALATION_SOLUTION | Freq: Four times a day (QID) | RESPIRATORY_TRACT | Status: DC | PRN

## 2024-03-13 MED ORDER — ACETAMINOPHEN 325 MG PO TABS: 650.0000 mg | ORAL_TABLET | Freq: Four times a day (QID) | ORAL | Status: DC | PRN

## 2024-03-13 MED ORDER — CARVEDILOL 3.125 MG PO TABS
6.2500 mg | ORAL_TABLET | Freq: Two times a day (BID) | ORAL | Status: DC
  Administered 2024-03-14 – 2024-03-16 (×5): 6.25 mg via ORAL
  Filled 2024-03-13 (×5): qty 2

## 2024-03-13 MED ORDER — INSULIN ASPART 100 UNIT/ML IJ SOLN
0.0000 [IU] | Freq: Three times a day (TID) | INTRAMUSCULAR | Status: DC
  Administered 2024-03-14: 1 [IU] via SUBCUTANEOUS
  Administered 2024-03-15: 3 [IU] via SUBCUTANEOUS
  Administered 2024-03-15: 2 [IU] via SUBCUTANEOUS
  Administered 2024-03-16 (×2): 3 [IU] via SUBCUTANEOUS
  Filled 2024-03-13 (×4): qty 1

## 2024-03-13 MED ORDER — MUPIROCIN 2 % EX OINT
1.0000 | TOPICAL_OINTMENT | Freq: Two times a day (BID) | CUTANEOUS | Status: DC
  Administered 2024-03-13 – 2024-03-16 (×7): 1 via NASAL
  Filled 2024-03-13: qty 22

## 2024-03-13 MED ORDER — PRAVASTATIN SODIUM 20 MG PO TABS
40.0000 mg | ORAL_TABLET | Freq: Every day | ORAL | Status: DC
  Administered 2024-03-13 – 2024-03-16 (×4): 40 mg via ORAL
  Filled 2024-03-13 (×4): qty 2

## 2024-03-13 MED ORDER — EMPAGLIFLOZIN 10 MG PO TABS
10.0000 mg | ORAL_TABLET | Freq: Every day | ORAL | Status: DC
  Administered 2024-03-14 – 2024-03-16 (×3): 10 mg via ORAL
  Filled 2024-03-13 (×4): qty 1

## 2024-03-13 MED ORDER — GABAPENTIN 100 MG PO CAPS
200.0000 mg | ORAL_CAPSULE | Freq: Three times a day (TID) | ORAL | Status: DC
  Administered 2024-03-13 – 2024-03-16 (×10): 200 mg via ORAL
  Filled 2024-03-13 (×10): qty 2

## 2024-03-13 MED ORDER — FUROSEMIDE 20 MG PO TABS
20.0000 mg | ORAL_TABLET | Freq: Every day | ORAL | Status: DC
  Administered 2024-03-14 – 2024-03-16 (×3): 20 mg via ORAL
  Filled 2024-03-13 (×3): qty 1

## 2024-03-13 MED ORDER — ONDANSETRON HCL 4 MG/2ML IJ SOLN: 4.0000 mg | Freq: Four times a day (QID) | INTRAMUSCULAR | Status: DC | PRN

## 2024-03-13 MED ORDER — IPRATROPIUM-ALBUTEROL 0.5-2.5 (3) MG/3ML IN SOLN
3.0000 mL | Freq: Four times a day (QID) | RESPIRATORY_TRACT | Status: DC
  Administered 2024-03-13: 3 mL via RESPIRATORY_TRACT
  Filled 2024-03-13: qty 3

## 2024-03-13 MED ORDER — PANTOPRAZOLE SODIUM 40 MG PO TBEC
40.0000 mg | DELAYED_RELEASE_TABLET | Freq: Every day | ORAL | Status: DC
  Administered 2024-03-14 – 2024-03-16 (×3): 40 mg via ORAL
  Filled 2024-03-13 (×3): qty 1

## 2024-03-13 NOTE — ED Notes (Signed)
Brief changed, pt repositioned

## 2024-03-13 NOTE — ED Provider Notes (Signed)
 [facility] Provider Note    Event Date/Time   First MD Initiated Contact with Patient 03/13/24 1053     (approximate)   History   Fall   HPI  Amber [name] is a 89 y.o. female with a history of hyperlipidemia, PAF, COPD, hypertension, diabetes, complete heart block with pacemaker, CAD, CKD, chronic pain, arthritis, anemia, heart failure, and anxiety who presents with left hip pain after fall last night.  The patient states that she rolled out of bed.  She did not hit her head or lose consciousness.  She denies feeling dizzy or weak.  She denies any neck or back pain.  She states that she normally is wheelchair-bound and does not walk on her own.  I reviewed the past medical records.  The patient was seen at the heart failure clinic on 9/5 reporting increased shortness of breath and fatigue.  She was thought to be euvolemic and was instructed to continue on her home medications.   Physical Exam   Triage Vital Signs: ED Triage Vitals  Encounter Vitals Group     BP 03/13/24 1042 (!) 161/89     Girls Systolic BP Percentile --      Girls Diastolic BP Percentile --      Boys Systolic BP Percentile --      Boys Diastolic BP Percentile --      Pulse Rate 03/13/24 1042 92     Resp 03/13/24 1042 18     Temp 03/13/24 1042 98.6 F (37 C)     Temp Source 03/13/24 1042 Oral     SpO2 03/13/24 1042 100 %     Weight --      Height --      Head Circumference --      Peak Flow --      Pain Score 03/13/24 1041 5     Pain Loc --      Pain Education --      Exclude from Growth Chart --     Most recent vital signs: Vitals:   03/13/24 1330 03/13/24 1415  BP: (!) 169/152   Pulse: (!) 55 60  Resp: 19 15  Temp:    SpO2: 90% 99%     General: Awake, no distress.  CV:  Good peripheral perfusion.  Resp:  Normal effort.  Abd:  No distention.  Other:  No midline spinal tenderness.  Motor intact in all extremities.  Left hip tenderness and pain on range of  motion.   ED Results / Procedures / Treatments   Labs (all labs ordered are listed, but only abnormal results are displayed) Labs Reviewed  BASIC METABOLIC PANEL WITH GFR - Abnormal; Notable for the following components:      Result Value   Glucose, Bld 136 (*)    All other components within normal limits  CBC WITH DIFFERENTIAL/PLATELET - Abnormal; Notable for the following components:   Hemoglobin 9.4 (*)    HCT 32.2 (*)    MCV 70.9 (*)    MCH 20.7 (*)    MCHC 29.2 (*)    RDW 21.5 (*)    All other components within normal limits  TROPONIN I (HIGH SENSITIVITY) - Abnormal; Notable for the following components:   Troponin I (High Sensitivity) 23 (*)    All other components within normal limits  TROPONIN I (HIGH SENSITIVITY) - Abnormal; Notable for the following components:   Troponin I (High Sensitivity) 25 (*)    All other  components within normal limits  HEMOGLOBIN A1C     EKG  ED ECG REPORT I, Waylon Cassis, the attending physician, personally viewed and interpreted this ECG.  Date: 03/13/2024 EKG Time: 1300 Rate: 60 Rhythm: normal sinus rhythm QRS Axis: normal Intervals: LBBB ST/T Wave abnormalities: normal Narrative Interpretation: no evidence of acute ischemia; incorrectly read by machine as acute MI, but unchanged from prior EKGs    RADIOLOGY  XR L hip: I independently viewed and interpreted the images; there is a left periprosthetic fracture of the greater trochanter  PROCEDURES:  Critical Care performed: No  Procedures   MEDICATIONS ORDERED IN ED: Medications  acetaminophen  (TYLENOL ) tablet 650 mg (has no administration in time range)  carvedilol  (COREG ) tablet 6.25 mg (has no administration in time range)  ezetimibe  (ZETIA ) tablet 10 mg (has no administration in time range)  furosemide  (LASIX ) tablet 20 mg (has no administration in time range)  pravastatin  (PRAVACHOL ) tablet 40 mg (has no administration in time range)  sacubitril -valsartan   (ENTRESTO ) 24-26 mg per tablet (has no administration in time range)  empagliflozin  (JARDIANCE ) tablet 10 mg (has no administration in time range)  pantoprazole  (PROTONIX ) EC tablet 40 mg (has no administration in time range)  polyethylene glycol (MIRALAX  / GLYCOLAX ) packet 17 g (has no administration in time range)  apixaban  (ELIQUIS ) tablet 5 mg (has no administration in time range)  gabapentin  (NEURONTIN ) capsule 200 mg (has no administration in time range)  albuterol  (PROVENTIL ) (2.5 MG/3ML) 0.083% nebulizer solution 3 mL (has no administration in time range)  ipratropium-albuterol  (DUONEB) 0.5-2.5 (3) MG/3ML nebulizer solution 3 mL (has no administration in time range)  HYDROcodone -acetaminophen  (NORCO/VICODIN) 5-325 MG per tablet 1-2 tablet (has no administration in time range)  HYDROmorphone  (DILAUDID ) injection 0.5 mg (has no administration in time range)  ondansetron  (ZOFRAN ) injection 4 mg (has no administration in time range)  insulin  aspart (novoLOG ) injection 0-9 Units (has no administration in time range)     IMPRESSION / MDM / ASSESSMENT AND PLAN / ED COURSE  I reviewed the triage vital signs and the nursing notes.  89 year old female with PMH as noted above presents with left hip pain after mechanical fall out of bed last night.  She denies hitting her head.  On exam she has no midline spinal tenderness or other apparent injuries.  Differential diagnosis includes, but is not limited to, contusion, fracture.  X-rays confirm fracture of the left hip.  I will consult orthopedics, obtain lab workup, and reassess.  Patient's presentation is most consistent with acute presentation with potential threat to life or bodily function.  The patient is on the cardiac monitor to evaluate for evidence of arrhythmia and/or significant heart rate changes.  ----------------------------------------- 2:13 PM on 03/13/2024 -----------------------------------------  X-rays reveal a left  periprosthetic greater trochanteric fracture.  I consulted and discussed the case with Dr. Maryrose from orthopedics who will evaluate.  BMP and CBC show no acute findings except for chronic anemia.  EKG shows stable left bundle branch block.  The patient has no chest pain.  The machine read on the EKG is acute MI, however it is unchanged from prior EKGs.  Troponin is negative.  The patient will need admission for further management.  I consulted Dr. Laurita from the hospitalist service; based on our discussion he agrees to evaluate the patient for admission.  FINAL CLINICAL IMPRESSION(S) / ED DIAGNOSES   Final diagnoses:  Closed nondisplaced fracture of greater trochanter of left femur, initial encounter (HCC)     Rx /  DC Orders   ED Discharge Orders     None        Note:  This document was prepared using Dragon voice recognition software and may include unintentional dictation errors.    Jacolyn Pae, MD 03/13/24 1610

## 2024-03-13 NOTE — H&P (Signed)
 History and Physical    Amber [name] FMW:[id number] DOB: 15-Mar-1930 DOA: 03/13/2024  PCP: Amber [name] BIRCH, MD (Confirm with patient/family/NH records and if not entered, this has to be entered at [facility] point of entry) Patient coming from: Home  I have personally briefly reviewed patient's old medical records in [facility]  Chief Complaint: Fall and broke left hip  HPI: Amber [name] is a 89 y.o. female with medical history significant of chronic HFrEF with LVEF 30-35%, chronic hypoxic respiratory failure on 2 L home O2 continuously, IDDM, PPM, CAD status post CABG, HTN, HLD, chronic pain, OA status post left hip replacement, presented with fall and left hip pain.  Patient has a chronic ambulation impairment, uses wheelchair at baseline, according to family she can briefly stand for few seconds and able to transfer from bed to wheelchair.  Last night however patient fell on left hip for the first time we will transfer her tomorrow wheelchair to toilet seat.  Family was able to pull her back to wheelchair but this morning she fell again on the same side hip and this time she developed excruciating pain and could not be moved. ED Course: Afebrile, nontachycardic blood pressure 169/80 O2 saturation 97%on 2 L.  X-ray showed comminuted left femoral periprosthetic fracture involving the left greater trochanter.  Review of Systems: As per HPI otherwise 14 point review of systems negative.    Past Medical History:  Diagnosis Date   Acute metabolic encephalopathy 06/20/2022   Anemia    Anxiety    Arthritis    Benign neoplasm of colon    CHF (congestive heart failure) (HCC)    Chronic kidney disease    Chronic pain    Complication of anesthesia 1980's   hard time waking up   Coronary artery disease with unspecified angina pectoris    Cough    Diabetes mellitus without complication (HCC)    Essential hypertension    GERD (gastroesophageal reflux disease)    High risk  medication use    Hyperlipidemia    Hypoglycemia 06/20/2022   Myocardial injury 06/20/2022   Plantar fascial fibromatosis    Presence of permanent cardiac pacemaker    Shortness of breath dyspnea     Past Surgical History:  Procedure Laterality Date   ABDOMINAL HYSTERECTOMY     BACK SURGERY  1960's   cage and screws in lower back   CATARACT EXTRACTION W/ INTRAOCULAR LENS  IMPLANT, BILATERAL Bilateral    CORONARY ARTERY BYPASS GRAFT  2008   triple   HARDWARE REMOVAL Right 12/08/2019   Procedure: HARDWARE REMOVAL;  Surgeon: Amber Sharper, MD;  Location: [facility] ORS;  Service: Orthopedics;  Laterality: Right;   JOINT REPLACEMENT Right    hip and knee   ORIF ANKLE FRACTURE Right 07/27/2014   Procedure: OPEN REDUCTION INTERNAL FIXATION (ORIF) ANKLE FRACTURE;  Surgeon: Amber LITTIE Gavel, MD;  Location: [facility] OR;  Service: Orthopedics;  Laterality: Right;   ORIF TIBIA FRACTURE Right 12/08/2019   Procedure: OPEN REDUCTION INTERNAL FIXATION (ORIF) TIBIA FRACTURE;  Surgeon: Amber Sharper, MD;  Location: [facility] ORS;  Service: Orthopedics;  Laterality: Right;   PACEMAKER INSERTION Left 03/14/2015   Procedure: INSERTION PACEMAKER;  Surgeon: Amber Dooms, MD;  Location: [facility] ORS;  Service: Cardiovascular;  Laterality: Left;   PACEMAKER LEADLESS INSERTION N/A 01/24/2020   Procedure: PACEMAKER LEADLESS INSERTION;  Surgeon: [name] Marsa, MD;  Location: [facility] INVASIVE CV LAB;  Service: Cardiovascular;  Laterality: N/A;   ROTATOR CUFF REPAIR Left  TOTAL HIP ARTHROPLASTY Right    TOTAL HIP ARTHROPLASTY Left 02/09/2018   Procedure: TOTAL HIP ARTHROPLASTY ANTERIOR APPROACH;  Surgeon: Amber Sharper, MD;  Location: [facility] ORS;  Service: Orthopedics;  Laterality: Left;   TOTAL KNEE ARTHROPLASTY Right    VEIN LIGATION AND STRIPPING       reports that she has never smoked. She has never used smokeless tobacco. She reports that she does not drink alcohol and does not use drugs.  Allergies  Allergen Reactions    Ambien [Zolpidem Tartrate] Other (See Comments)    Reaction:  Keeps pt awake    Iodine Itching   Succinylcholine Other (See Comments)    Reaction:  Unknown    Etodolac Other (See Comments)    GI upset   Nsaids Other (See Comments) and Nausea And Vomiting    Reaction: gi upset  Takes BC powders    Family History  Problem Relation Age of Onset   Heart attack Mother    Heart disease Father    Alzheimer's disease Sister    Cervical cancer Sister    Heart failure Son      Prior to Admission medications   Medication Sig Start Date End Date Taking? Authorizing Provider  acetaminophen  (TYLENOL ) 325 MG tablet Take 2 tablets (650 mg total) by mouth every 6 (six) hours as needed for mild pain (or Fever >/= 101). 02/08/23   Amber Drue DASEN, MD  albuterol  (PROVENTIL  HFA;VENTOLIN  HFA) 108 (90 Base) MCG/ACT inhaler Inhale 2 puffs into the lungs every 6 (six) hours as needed for wheezing or shortness of breath. 01/28/17   Amber Ade, MD  alendronate  (FOSAMAX ) 70 MG tablet Take 70 mg by mouth once a week. Patient not taking: Reported on 03/11/2024 02/26/24   [provider]  apixaban  (ELIQUIS ) 5 MG TABS tablet Take 5 mg by mouth 2 (two) times daily.    [provider]  calcium -vitamin D  (OSCAL WITH D) 500-200 MG-UNIT tablet Take 1 tablet by mouth every other day. Patient not taking: Reported on 03/11/2024    [provider]  carvedilol  (COREG ) 6.25 MG tablet Take 1 tablet (6.25 mg total) by mouth 2 (two) times daily with a meal. 01/28/17   Amber Ade, MD  empagliflozin  (JARDIANCE ) 10 MG TABS tablet Take 10 mg by mouth daily.    [provider]  ezetimibe  (ZETIA ) 10 MG tablet Take 1 tablet by mouth daily. 04/22/23 04/21/24  [provider]  furosemide  (LASIX ) 20 MG tablet Take 1 tablet (20 mg total) by mouth 2 (two) times daily. Patient taking differently: Take 20 mg by mouth daily. 02/27/20   Amber Mario GAILS, MD  gabapentin  (NEURONTIN ) 100 MG capsule  Take 200 mg by mouth 3 (three) times daily.    [provider]  insulin  glargine (LANTUS ) 100 UNIT/ML injection Inject 0.05 mLs (5 Units total) into the skin daily. Hold if blood sugar less than 120 or if not eating well. 06/23/22   Fausto Sor A, DO  ipratropium-albuterol  (DUONEB) 0.5-2.5 (3) MG/3ML SOLN Take 3 mLs by nebulization 4 (four) times daily.    [provider]  Multiple Vitamins-Minerals (PRESERVISION AREDS 2) CAPS Take 1 capsule by mouth every other day.    [provider]  omeprazole (PRILOSEC) 40 MG capsule Take 40 mg by mouth daily. 04/03/23   [provider]  polyethylene glycol (MIRALAX  / GLYCOLAX ) 17 g packet Take 17 g by mouth daily as needed for mild constipation. 02/08/23   Amber Drue DASEN, MD  pravastatin  (PRAVACHOL ) 40 MG tablet Take 40 mg by mouth at bedtime.    [provider]  sacubitril -valsartan  (ENTRESTO ) 24-26 MG Take 1 tablet by mouth twice daily 12/01/23   Donette Ellouise LABOR, FNP    Physical Exam: Vitals:   03/13/24 1042 03/13/24 1300 03/13/24 1330 03/13/24 1415  BP: (!) 161/89 (!) 161/80 (!) 169/152   Pulse: 92 63 (!) 55 60  Resp: 18 18 19 15   Temp: 98.6 F (37 C)     TempSrc: Oral     SpO2: 100% 97% 90% 99%    Constitutional: NAD, calm, comfortable Vitals:   03/13/24 1042 03/13/24 1300 03/13/24 1330 03/13/24 1415  BP: (!) 161/89 (!) 161/80 (!) 169/152   Pulse: 92 63 (!) 55 60  Resp: 18 18 19 15   Temp: 98.6 F (37 C)     TempSrc: Oral     SpO2: 100% 97% 90% 99%   Eyes: PERRL, lids and conjunctivae normal ENMT: Mucous membranes are moist. Posterior pharynx clear of any exudate or lesions.Normal dentition.  Neck: normal, supple, no masses, no thyromegaly Respiratory: clear to auscultation bilaterally, no wheezing, no crackles. Normal respiratory effort. No accessory muscle use.  Cardiovascular: Regular rate and rhythm, no murmurs / rubs / gallops. No extremity edema. 2+ pedal pulses. No carotid bruits.   Abdomen: no tenderness, no masses palpated. No hepatosplenomegaly. Bowel sounds positive.  Musculoskeletal: Left leg rotated and shortened Skin: no rashes, lesions, ulcers. No induration Neurologic: CN 2-12 grossly intact. Sensation intact, DTR normal. Strength 5/5 in all 4.  Psychiatric: Normal judgment and insight. Alert and oriented x 3. Normal mood.     Labs on Admission: I have personally reviewed following labs and imaging studies  CBC: Recent Labs  Lab 03/13/24 1256  WBC 7.3  NEUTROABS 5.4  HGB 9.4*  HCT 32.2*  MCV 70.9*  PLT 166   Basic Metabolic Panel: Recent Labs  Lab 03/13/24 1256  NA 139  K 3.8  CL 99  CO2 27  GLUCOSE 136*  BUN 22  CREATININE 0.75  CALCIUM  9.2   GFR: CrCl cannot be calculated (Unknown ideal weight.). Liver Function Tests: No results for input(s): AST, ALT, ALKPHOS, BILITOT, PROT, ALBUMIN  in the last 168 hours. No results for input(s): LIPASE, AMYLASE in the last 168 hours. No results for input(s): AMMONIA in the last 168 hours. Coagulation Profile: No results for input(s): INR, PROTIME in the last 168 hours. Cardiac Enzymes: No results for input(s): CKTOTAL, CKMB, CKMBINDEX, TROPONINI in the last 168 hours. BNP (last 3 results) No results for input(s): PROBNP in the last 8760 hours. HbA1C: No results for input(s): HGBA1C in the last 72 hours. CBG: No results for input(s): GLUCAP in the last 168 hours. Lipid Profile: No results for input(s): CHOL, HDL, LDLCALC, TRIG, CHOLHDL, LDLDIRECT in the last 72 hours. Thyroid  Function Tests: No results for input(s): TSH, T4TOTAL, FREET4, T3FREE, THYROIDAB in the last 72 hours. Anemia Panel: No results for input(s): VITAMINB12, FOLATE, FERRITIN, TIBC, IRON, RETICCTPCT in the last 72 hours. Urine analysis:    Component Value Date/Time   COLORURINE YELLOW (A) 06/11/2023 2329   APPEARANCEUR CLEAR (A) 06/11/2023 2329    APPEARANCEUR Hazy 11/25/2013 2129   LABSPEC 1.035 (H) 06/11/2023 2329   LABSPEC 1.019 11/25/2013 2129   PHURINE 5.0 06/11/2023 2329   GLUCOSEU >=500 (A) 06/11/2023 2329   GLUCOSEU Negative 11/25/2013 2129   HGBUR NEGATIVE 06/11/2023 2329   BILIRUBINUR NEGATIVE 06/11/2023 2329   BILIRUBINUR Negative 11/25/2013 2129  KETONESUR NEGATIVE 06/11/2023 2329   PROTEINUR NEGATIVE 06/11/2023 2329   NITRITE NEGATIVE 06/11/2023 2329   LEUKOCYTESUR NEGATIVE 06/11/2023 2329   LEUKOCYTESUR Trace 11/25/2013 2129    Radiological Exams on Admission: DG Hip Unilat W or Wo Pelvis 2-3 Views Left Result Date: 03/13/2024 CLINICAL DATA:  Status post fall with left hip pain EXAM: DG HIP (WITH OR WITHOUT PELVIS) 3V LEFT COMPARISON:  Right hip and pelvis radiograph dated 09/01/2018 FINDINGS: Postsurgical changes of bilateral hip arthroplasties. Hardware appears intact and well seated. Comminuted fracture involving the left greater trochanter. No acute hip dislocation. IMPRESSION: Comminuted periprosthetic fracture involving the left greater trochanter. Electronically Signed   By: Limin  Xu M.D.   On: 03/13/2024 12:40    EKG: Independently reviewed.  Sinus, chronic LBBB  Assessment/Plan Principal Problem:   Hip fracture (HCC) Active Problems:   Hip fracture, unspecified laterality, closed, initial encounter (HCC)  (please populate well all problems here in Problem List. (For example, if patient is on BP meds at home and you resume or decide to hold them, it is a problem that needs to be her. Same for CAD, COPD, HLD and so on)  Left hip fracture - Secondary to mechanical fall - Given patient's already acquired decreased baseline ambulatory function, orthopedic surgery offered conservative management. - Explained to the patient regarding complication associated with hip fracture, including pneumonia, atelectasis, UTI.  Confirmed with family that patient is DNR/DNI, family declined palliative care at this point. -  Pain management - Will wait for orthopedic surgeries instruction for PT OT  Chronic HF R EF - Euvolemic - Continue Lasix , continue Entresto   COPD with chronic hypoxic respiratory failure - Incentive spirometry - Oxygen  level at baseline, no abdominal signs of acute exacerbation - Continue as needed DuoNebs  IDDM -SSI for now  Mixed HLD - Continue statin - Continue Zetia   Morbid obesity - BMI= 36 - Not interested in calorie control   Total time spent on patient care 55 minutes  DVT prophylaxis: Heparin  subcu Code Status: DNR/DNI Family Communication: Daughter/POA at bedside Disposition Plan: Patient is sick with left hip fracture requiring inpatient PT evaluation and inpatient orthopedic surgery consultation and follow-up, expect more than 2 midnight hospital stay Consults called: Orthopedic surgery Admission status: Telemetry admission   Cort ONEIDA Mana MD [facility] Pager [phone] 03/13/2024, 2:37 PM

## 2024-03-13 NOTE — ED Triage Notes (Signed)
 Patient to ED via [facility] from home; patient's family reported patient got into an argument with her son and rolled out of bed onto hardwood floor. Complaining of pain to left hip.  BP 141/88 HR 92 O2 97% on 3L CBG 138

## 2024-03-13 NOTE — Consult Note (Signed)
 ORTHOPEDIC CONSULTATION  Amber [name] [id number]  03/13/2024  CC:  Chief Complaint  Patient presents with   Fall    History of Present IlIness: The patient is a 89 y.o. female who presented to the local emergency department complaining of left hip pain after ground-level fall.  Her family reports that she is minimally ambulatory and spends most of her time in her wheelchair or seated.  She can use a walker to stand for transfers.  Admission history for completeness:  Amber [name] is a 89 y.o. female with medical history significant of chronic HFrEF with LVEF 30-35%, chronic hypoxic respiratory failure on 2 L home O2 continuously, IDDM, PPM, CAD status post CABG, HTN, HLD, chronic pain, OA status post left hip replacement, presented with fall and left hip pain.   Patient has a chronic ambulation impairment, uses wheelchair at baseline, according to family she can briefly stand for few seconds and able to transfer from bed to wheelchair.  Last night however patient fell on left hip for the first time we will transfer her tomorrow wheelchair to toilet seat.  Family was able to pull her back to wheelchair but this morning she fell again on the same side hip and this time she developed excruciating pain and could not be moved.  PMH:  Past Medical History:  Diagnosis Date   Acute metabolic encephalopathy 06/20/2022   Anemia    Anxiety    Arthritis    Benign neoplasm of colon    CHF (congestive heart failure) (HCC)    Chronic kidney disease    Chronic pain    Complication of anesthesia 1980's   hard time waking up   Coronary artery disease with unspecified angina pectoris    Cough    Diabetes mellitus without complication (HCC)    Essential hypertension    GERD (gastroesophageal reflux disease)    High risk medication use    Hyperlipidemia    Hypoglycemia 06/20/2022   Myocardial injury 06/20/2022   Plantar fascial fibromatosis    Presence of permanent cardiac pacemaker     Shortness of breath dyspnea     SH:  Past Surgical History:  Procedure Laterality Date   ABDOMINAL HYSTERECTOMY     BACK SURGERY  1960's   cage and screws in lower back   CATARACT EXTRACTION W/ INTRAOCULAR LENS  IMPLANT, BILATERAL Bilateral    CORONARY ARTERY BYPASS GRAFT  2008   triple   HARDWARE REMOVAL Right 12/08/2019   Procedure: HARDWARE REMOVAL;  Surgeon: Kathlynn Sharper, MD;  Location: [facility] ORS;  Service: Orthopedics;  Laterality: Right;   JOINT REPLACEMENT Right    hip and knee   ORIF ANKLE FRACTURE Right 07/27/2014   Procedure: OPEN REDUCTION INTERNAL FIXATION (ORIF) ANKLE FRACTURE;  Surgeon: Norleen LITTIE Gavel, MD;  Location: [facility] OR;  Service: Orthopedics;  Laterality: Right;   ORIF TIBIA FRACTURE Right 12/08/2019   Procedure: OPEN REDUCTION INTERNAL FIXATION (ORIF) TIBIA FRACTURE;  Surgeon: Kathlynn Sharper, MD;  Location: [facility] ORS;  Service: Orthopedics;  Laterality: Right;   PACEMAKER INSERTION Left 03/14/2015   Procedure: INSERTION PACEMAKER;  Surgeon: Marsa Dooms, MD;  Location: [facility] ORS;  Service: Cardiovascular;  Laterality: Left;   PACEMAKER LEADLESS INSERTION N/A 01/24/2020   Procedure: PACEMAKER LEADLESS INSERTION;  Surgeon: Dooms Marsa, MD;  Location: [facility] INV[facility] CV LAB;  Service: Cardiovascular;  Laterality: N/A;   ROTATOR CUFF REPAIR Left    TOTAL HIP ARTHROPLASTY Right    TOTAL HIP ARTHROPLASTY Left 02/09/2018  Procedure: TOTAL HIP ARTHROPLASTY ANTERIOR APPROACH;  Surgeon: Kathlynn Sharper, MD;  Location: [facility] ORS;  Service: Orthopedics;  Laterality: Left;   TOTAL KNEE ARTHROPLASTY Right    VEIN LIGATION AND STRIPPING      ALL:  Allergies  Allergen Reactions   Ambien [Zolpidem Tartrate] Other (See Comments)    Reaction:  Keeps pt awake    Iodine Itching   Succinylcholine Other (See Comments)    Reaction:  Unknown    Etodolac Other (See Comments)    GI upset   Nsaids Other (See Comments) and Nausea And Vomiting    Reaction: gi upset  Takes BC  powders    MED: (Not in a hospital admission)   All home medications have been reviewed as documented in the medication reconciliation portion of the patient record.  FH:  Family History  Problem Relation Age of Onset   Heart attack Mother    Heart disease Father    Alzheimer's disease Sister    Cervical cancer Sister    Heart failure Son     Social:  reports that she has never smoked. She has never used smokeless tobacco. She reports that she does not drink alcohol and does not use drugs.  Review of Systems: General: Denies fever, chills, weight loss Eyes: Denies blurry vision, changes in vision ENT: Denies sore throat, congestions, nosebleeds CV: Denies chest pain, palpitations Respiratory: Denies shortness of breath, wheezing, cough Gl: Denies abdominal pain, nausea, vomiting GU: Denies hematuria Integumentary: Denies rashes or lesions Neuro: Denies headache, dizziness Psych: Negative Hem/Onc: Denies easy bruising or bleeding disorders Musculoskeletal: See HPI above.  Vitals: BP (!) 169/152   Pulse 60   Temp 98.6 F (37 C) (Oral)   Resp 15   SpO2 99%    Physical Exam: General: Awake, alert and oriented, no acute distress. Eyes: Pupils reactive, EOMI, normal conjunctiva, no scleral icterus. HENT: Normocephalic, atraumatic, normal hearing, moist oral mucosa Neck: Supple, non-tender, no cervical lymphadenopathy. Lungs: Chest rise is symmetric, non-labored respiration, chest wall nontender to palpation Heart: Normal rate by palpation, normal peripheral perfusion Abdomen: Soft, non-tender, non-distended. Pelvis is stable. Skin: Skin envelope intact, dry and pink, no rashes or lesions, no signs of infection. Neurologic: Awake, alert, and oriented X3 Psychiatric: Cooperative, appropriate mood and affect.  Musculoskeletal: Evaluation of the patient's bilateral upper extremities and right lower extremity reveal no areas of tenderness or limitations to range of motion.  Any motion about the patient's left hip is extremely painful. Palpation of the left distal femur, knee, tib-fib region, ankle and foot show no areas of tenderness. The patient's calves are soft and nontender with no palpable cords. Homans testing is negative. Dorsalis pedis and posterior tibial pulse are 2+ and symmetric. The patient's toes are pink and warm with a brisk capillary refill time. The patient is able to gently move their ankles and toes on command. Sensation is intact over all lower extremity dermatomal patterns.   Radiographic findings: Radiographs of the patient's pelvis and bilateral proximal femurs were independently evaluated on the PACS system.  The patient is status post bilateral total hip arthroplasties.  There are no acute pelvic fractures.  The right hip appears stable.  The left hip has nondisplaced comminuted fractures through the greater trochanter.  The implant stem appears stable.   Labs:  Recent Labs    03/13/24 1256  HGB 9.4*   Recent Labs    03/13/24 1256  WBC 7.3  RBC 4.54  HCT 32.2*  PLT 166  Recent Labs    03/13/24 1256  NA 139  K 3.8  CL 99  CO2 27  BUN 22  CREATININE 0.75  GLUCOSE 136*  CALCIUM  9.2   No results for input(s): LABPT, INR in the last 72 hours.   Assessment/Plan:    Assessment: 89 year old minimally ambulatory female status post ground-level fall with closed nondisplaced comminuted left greater trochanter periprosthetic fractures.   Plan: I discussed with the patient and her family that I would recommend very conservative nonoperative treatment for this injury.  The patient is very minimally ambulatory and usually only can take a few steps with a walker which she mainly uses for transfers.  Physical therapy can begin gentle active and passive range of motion exercises of the left hip.  I would not anticipate her returning to ambulatory status past her baseline.  Will be available if needed otherwise we will sign  off.  Amber [name] M.D. 03/13/2024 3:52 PM

## 2024-03-13 NOTE — Discharge Instructions (Signed)
 ORTHOPEDIC DISCHARGE INSTRUCTIONS  Follow Up Appointment:  Follow-up in the office in 10-14 days.  Please contact the [facility] at [phone]  to schedule your follow-up appointment.  Dressing and cast care instructions:  No dressing is needed.  Weight-bearing status:  You are weightbearing as tolerated on the LEFT LOWER EXTREMITY with your walker/crutches.   Diet:   You may resume your regular diet as tolerated.  Begin with clear liquids and slowly advance to your normal diet.  Medications:   Take your medicines as prescribed. You may have written prescriptions or your prescriptions may have been E-prescribed to your pharmacy. If you were given prescriptions for any blood thinners, it is a very important that you take these medications as prescribed to prevent blood clots.  General instructions:  Elevate the affected extremity to control pain and swelling. Apply ice packs to the affected area to control pain and swelling.  Surgery and pain medications may lead to constipation. It is easier to prevent this than it is to treat it. We recommend MiraLAX  or Senna/Senakot for laxatives and Colace as a stool softener as needed after surgery. Drink plenty of fluids to stay well-hydrated. Consult your local pharmacist for other treatment recommendations.   Please perform deep breathing exercises every hour to increase the airflow in your lungs which could predispose you to running a low-grade fever and increase your risk of pneumonia.

## 2024-03-13 NOTE — ED Notes (Signed)
 Risk for fall bracelet placed to left wrist, bilateral non skid socks applied and bed alarm on.

## 2024-03-13 NOTE — ED Notes (Signed)
Patient declined warm blanket.

## 2024-03-14 DIAGNOSIS — S72002D Fracture of unspecified part of neck of left femur, subsequent encounter for closed fracture with routine healing: Secondary | ICD-10-CM | POA: Diagnosis not present

## 2024-03-14 LAB — GLUCOSE, CAPILLARY
Glucose-Capillary: 116 mg/dL — ABNORMAL HIGH (ref 70–99)
Glucose-Capillary: 135 mg/dL — ABNORMAL HIGH (ref 70–99)
Glucose-Capillary: 112 mg/dL — ABNORMAL HIGH (ref 70–99)

## 2024-03-14 MED ORDER — IPRATROPIUM-ALBUTEROL 0.5-2.5 (3) MG/3ML IN SOLN
3.0000 mL | Freq: Four times a day (QID) | RESPIRATORY_TRACT | Status: DC
  Administered 2024-03-14: 3 mL via RESPIRATORY_TRACT
  Filled 2024-03-14: qty 3

## 2024-03-14 MED ORDER — ENSURE PLUS HIGH PROTEIN PO LIQD
237.0000 mL | Freq: Two times a day (BID) | ORAL | Status: DC
  Administered 2024-03-14 – 2024-03-16 (×5): 237 mL via ORAL

## 2024-03-14 MED ORDER — ADULT MULTIVITAMIN W/MINERALS CH
1.0000 | ORAL_TABLET | Freq: Every day | ORAL | Status: DC
  Administered 2024-03-14 – 2024-03-16 (×3): 1 via ORAL
  Filled 2024-03-14 (×3): qty 1

## 2024-03-14 MED ORDER — IPRATROPIUM-ALBUTEROL 0.5-2.5 (3) MG/3ML IN SOLN
3.0000 mL | Freq: Two times a day (BID) | RESPIRATORY_TRACT | Status: DC
  Administered 2024-03-14 – 2024-03-16 (×5): 3 mL via RESPIRATORY_TRACT
  Filled 2024-03-14 (×5): qty 3

## 2024-03-14 NOTE — Progress Notes (Signed)
 ORTHOPEDIC PROGRESS NOTE  SUBJECTIVE:     The patient is resting quietly in bed.  OBJECTIVE:  Vitals:   03/14/24 0835 03/14/24 1004  BP:  (!) 142/73  Pulse:  60  Resp:  18  Temp:  97.6 F (36.4 C)  SpO2: 98% 98%    The patient's bilateral lower extremities are neurovascularly intact.  Their toes are pink and warm with brisk capillary refill time.  Dorsalis pedis and posterior tibial pulse are 2+ and symmetric. The patient's calves are soft, nontender, with a negative Homans test bilaterally. The patient is making slow progress with physical therapy as expected based on their overall medical condition.  Labs:  Recent Labs    03/13/24 1256  HGB 9.4*   Recent Labs    03/13/24 1256  WBC 7.3  RBC 4.54  HCT 32.2*  PLT 166   Recent Labs    03/13/24 1256  NA 139  K 3.8  CL 99  CO2 27  BUN 22  CREATININE 0.75  GLUCOSE 136*  CALCIUM  9.2   No results for input(s): LABPT, INR in the last 72 hours.    ASSESSMENT:  Assessment: 89 year old minimally ambulatory female status post ground-level fall with closed nondisplaced comminuted left greater trochanter periprosthetic fractures.   The patient's orthopedic condition is stable and they continue to improve daily.   PLAN:  Continue PT to mobilize patient as tolerated. Continue DVT prophylaxis.   Amber [name] M.D. 03/14/2024 11:58 AM

## 2024-03-14 NOTE — Evaluation (Signed)
 Occupational Therapy Evaluation Patient Details Name: Amber [name] MRN: [id number] DOB: [birth year] Today's Date: 03/14/2024   History of Present Illness   89 year old minimally ambulatory female status post ground-level fall with closed nondisplaced comminuted left greater trochanter periprosthetic fractures. WBAT to LLE. Non-surgical interventions at this point. PMH: chronic HFrEF with LVEF 30-35%, chronic hypoxic respiratory failure on 2 L home O2 continuously, IDDM, PPM, CAD status post CABG, HTN, HLD, chronic pain   Clinical Impressions Ms Whitaker was seen for OT evaluation this date. Pt is poor historian, reports PRN assist from family for w/c transfers. Pt currently requires MAX A x2 bed mobility, fair static sitting balance. MAX A x2 + RW bed>chair pivot t/f. Poor eccentric control. Pt would benefit from skilled OT to address noted impairments and functional limitations (see below for any additional details). Upon hospital discharge, pt may return home with +2 assistance for all mobility, if +2 assist unavailable will require STR stay.    If plan is discharge home, recommend the following:   Two people to help with walking and/or transfers;Two people to help with bathing/dressing/bathroom;Help with stairs or ramp for entrance     Functional Status Assessment   Patient has had a recent decline in their functional status and demonstrates the ability to make significant improvements in function in a reasonable and predictable amount of time.     Equipment Recommendations   BSC/3in1     Recommendations for Other Services         Precautions/Restrictions   Precautions Precautions: None Recall of Precautions/Restrictions: Impaired Restrictions Weight Bearing Restrictions Per Provider Order: Yes LLE Weight Bearing Per Provider Order: Weight bearing as tolerated     Mobility Bed Mobility Overal bed mobility: Needs Assistance Bed Mobility: Supine to Sit      Supine to sit: Max assist, +2 for physical assistance          Transfers Overall transfer level: Needs assistance Equipment used: Rolling walker (2 wheels) Transfers: Sit to/from Stand, Bed to chair/wheelchair/BSC Sit to Stand: Max assist, +2 physical assistance Stand pivot transfers: Max assist, +2 physical assistance                Balance Overall balance assessment: Needs assistance Sitting-balance support: Bilateral upper extremity supported, Feet supported Sitting balance-Leahy Scale: Fair     Standing balance support: Bilateral upper extremity supported Standing balance-Leahy Scale: Poor                             ADL either performed or assessed with clinical judgement   ADL Overall ADL's : Needs assistance/impaired                                       General ADL Comments: MAX A x2 + RW simulated BSC t/f. MAX A don B socks in sitting     Vision         Perception         Praxis         Pertinent Vitals/Pain Pain Assessment Pain Assessment: PAINAD Breathing: normal Negative Vocalization: none Facial Expression: sad, frightened, frown Body Language: relaxed Consolability: no need to console PAINAD Score: 1 Pain Intervention(s): Limited activity within patient's tolerance     Extremity/Trunk Assessment Upper Extremity Assessment Upper Extremity Assessment: Generalized weakness   Lower Extremity Assessment Lower Extremity Assessment: Generalized weakness LLE  Deficits / Details: pain with movement LLE: Unable to fully assess due to pain       Communication Communication Communication: No apparent difficulties   Cognition Arousal: Alert Behavior During Therapy: WFL for tasks assessed/performed Cognition: No family/caregiver present to determine baseline                               Following commands: Intact       Cueing  General Comments   Cueing Techniques: Verbal cues;Tactile  cues;Visual cues      Exercises     Shoulder Instructions      Home Living Family/patient expects to be discharged to:: Private residence Living Arrangements: Children   Type of Home: House                       Home Equipment: Wheelchair - manual   Additional Comments: pt tangential and unclear on home setup      Prior Functioning/Environment Prior Level of Function : Independent/Modified Independent             Mobility Comments: assist for w/c t/fs, IND bed mobility, self-propels w/c household distances per pt report ADLs Comments: reports assist    OT Problem List: Decreased strength;Decreased range of motion;Decreased activity tolerance;Impaired balance (sitting and/or standing);Decreased safety awareness   OT Treatment/Interventions: Self-care/ADL training;Therapeutic exercise;Energy conservation;DME and/or AE instruction;Therapeutic activities;Patient/family education;Balance training      OT Goals(Current goals can be found in the care plan section)   Acute Rehab OT Goals Patient Stated Goal: to go to rehab OT Goal Formulation: With patient Time For Goal Achievement: 03/28/24 Potential to Achieve Goals: Good ADL Goals Pt Will Perform Grooming: with set-up;with supervision;sitting Pt Will Perform Lower Body Dressing: with mod assist;sit to/from stand Pt Will Transfer to Toilet: with mod assist;stand pivot transfer;bedside commode   OT Frequency:  Min 2X/week    Co-evaluation PT/OT/SLP Co-Evaluation/Treatment: Yes Reason for Co-Treatment: For patient/therapist safety PT goals addressed during session: Mobility/safety with mobility;Balance;Strengthening/ROM        AM-PAC OT 6 Clicks Daily Activity     Outcome Measure Help from another person eating meals?: None Help from another person taking care of personal grooming?: A Little Help from another person toileting, which includes using toliet, bedpan, or urinal?: A Lot Help from another  person bathing (including washing, rinsing, drying)?: A Lot Help from another person to put on and taking off regular upper body clothing?: A Little Help from another person to put on and taking off regular lower body clothing?: A Lot 6 Click Score: 16   End of Session Nurse Communication: Mobility status  Activity Tolerance: Patient tolerated treatment well Patient left: in chair;with call bell/phone within reach;with chair alarm set  OT Visit Diagnosis: Other abnormalities of gait and mobility (R26.89);Muscle weakness (generalized) (M62.81)                Time: 9040-8976 OT Time Calculation (min): 24 min Charges:  OT General Charges $OT Visit: 1 Visit OT Evaluation $OT Eval Moderate Complexity: 1 Mod OT Treatments $Self Care/Home Management : 8-22 mins  Elston Slot, M.S. OTR/L  03/14/24, 1:26 PM  ascom [phone]

## 2024-03-14 NOTE — Evaluation (Signed)
 Physical Therapy Evaluation Patient Details Name: Amber [name] MRN: [id number] DOB: 07-22-1929 Today's Date: 03/14/2024  History of Present Illness  89 year old minimally ambulatory female status post ground-level fall with closed nondisplaced comminuted left greater trochanter periprosthetic fractures. WBAT to LLE. Non-surgical interventions at this point.  Clinical Impression  Pt agreeable to PT and OT evaluation. Pt alert, pleasant and cooperative. Pt is poor historian.  Low level functions at baseline. Ind with transfers and w/c mobility at household level. Has good family support. PT assessment revealed pain with movement and none at rest, difficulty WB to LLE in standing. Pt requires time to complete the transfers and bed mobility. Pt needs max of 2 for bed mobility and transfers at this point . PT will continue in acute care and will needs 24 hrs care/sup/assist at home for safe discharge or pt will benefit from continued Rehab to return to PLOF confirmed by [facility] score.       If plan is discharge home, recommend the following: Two people to help with walking and/or transfers;Two people to help with bathing/dressing/bathroom;Assistance with cooking/housework;Assistance with feeding;Direct supervision/assist for medications management;Direct supervision/assist for financial management;Assist for transportation;Help with stairs or ramp for entrance;Supervision due to cognitive status   Can travel by private vehicle        Equipment Recommendations BSC/3in1  Recommendations for Other Services       Functional Status Assessment Patient has had a recent decline in their functional status and demonstrates the ability to make significant improvements in function in a reasonable and predictable amount of time.     Precautions / Restrictions Precautions Precautions: None Recall of Precautions/Restrictions: Impaired Restrictions Weight Bearing Restrictions Per Provider Order:  Yes LLE Weight Bearing Per Provider Order: Weight bearing as tolerated      Mobility  Bed Mobility Overal bed mobility: Needs Assistance Bed Mobility: Supine to Sit     Supine to sit: Max assist, +2 for physical assistance     General bed mobility comments: pain and need Max asssit for hand placment and sequencing of the  task.    Transfers Overall transfer level: Needs assistance Equipment used: Rolling walker (2 wheels) Transfers: Sit to/from Stand, Bed to chair/wheelchair/BSC Sit to Stand: Max assist, +2 physical assistance Stand pivot transfers: Max assist, +2 physical assistance         General transfer comment: slow and unable to weightbear and achieve uprigth positon during the pivot transfer.    Ambulation/Gait Ambulation/Gait assistance: Total assist             General Gait Details: Not appropriate at this point.  Stairs            Wheelchair Mobility     Tilt Bed    Modified Rankin (Stroke Patients Only)       Balance Overall balance assessment: Needs assistance Sitting-balance support: Bilateral upper extremity supported, Feet supported Sitting balance-Leahy Scale: Fair Sitting balance - Comments: sitting balance imporves with time.   Standing balance support: Bilateral upper extremity supported Standing balance-Leahy Scale: Poor Standing balance comment: using FWW and Max of 2                             Pertinent Vitals/Pain Pain Assessment Pain Assessment: PAINAD Pain Intervention(s): Limited activity within patient's tolerance    Home Living Family/patient expects to be discharged to:: Private residence Living Arrangements: Children   Type of Home: House  Prior Function Prior Level of Function : Independent/Modified Independent             Mobility Comments: Pt Ind with Bed to W/C trnasfers and Ind with W/C mobility at household level. ADLs Comments: unknown at this point.      Extremity/Trunk Assessment   Upper Extremity Assessment Upper Extremity Assessment: Defer to OT evaluation    Lower Extremity Assessment Lower Extremity Assessment: Generalized weakness;LLE deficits/detail LLE Deficits / Details: pain with movement LLE: Unable to fully assess due to pain       Communication   Communication Communication: No apparent difficulties    Cognition Arousal: Alert Behavior During Therapy: WFL for tasks assessed/performed                           PT - Cognition Comments: mild cognitive impairment. Following commands: Intact       Cueing Cueing Techniques: Verbal cues, Tactile cues, Visual cues     General Comments      Exercises Other Exercises Other Exercises: Hip surgery Handout  provided but needs reenfporcewment for carry over.   Assessment/Plan    PT Assessment Patient needs continued PT services  PT Problem List Decreased strength;Decreased activity tolerance;Decreased range of motion;Decreased balance;Decreased mobility;Decreased safety awareness;Pain;Obesity       PT Treatment Interventions Functional mobility training;Therapeutic activities;Therapeutic exercise;Balance training;Neuromuscular re-education;Patient/family education;Wheelchair mobility training    PT Goals (Current goals can be found in the Care Plan section)  Acute Rehab PT Goals Patient Stated Goal: unable to set goals Time For Goal Achievement: 03/28/24 Potential to Achieve Goals: Good    Frequency Min 1X/week     Co-evaluation PT/OT/SLP Co-Evaluation/Treatment: Yes Reason for Co-Treatment: For patient/therapist safety PT goals addressed during session: Mobility/safety with mobility;Balance;Strengthening/ROM         AM-PAC PT 6 Clicks Mobility  Outcome Measure Help needed turning from your back to your side while in a flat bed without using bedrails?: A Lot Help needed moving from lying on your back to sitting on the side of a flat  bed without using bedrails?: A Lot Help needed moving to and from a bed to a chair (including a wheelchair)?: A Lot Help needed standing up from a chair using your arms (e.g., wheelchair or bedside chair)?: A Lot Help needed to walk in hospital room?: Total Help needed climbing 3-5 steps with a railing? : Total 6 Click Score: 10    End of Session Equipment Utilized During Treatment: Gait belt Activity Tolerance: Patient limited by pain Patient left: in chair;with call bell/phone within reach;with chair alarm set Nurse Communication: Mobility status;Precautions;Weight bearing status PT Visit Diagnosis: Other abnormalities of gait and mobility (R26.89);Repeated falls (R29.6);Muscle weakness (generalized) (M62.81);History of falling (Z91.81);Difficulty in walking, not elsewhere classified (R26.2);Pain    Time: 9042-8977 PT Time Calculation (min) (ACUTE ONLY): 25 min   Charges:   PT Evaluation $PT Eval Low Complexity: 1 Low PT Treatments $Therapeutic Activity: 8-22 mins PT General Charges $$ ACUTE PT VISIT: 1 Visit        Amber [name] PT DPT 1:10 PM,03/14/24

## 2024-03-14 NOTE — Evaluation (Signed)
 Clinical/Bedside Swallow Evaluation Patient Details  Name: Nayelie Gionfriddo MRN: [id number] Date of Birth: 10-17-1929  Today's Date: 03/14/2024 Time: SLP Start Time (ACUTE ONLY): 1100 SLP Stop Time (ACUTE ONLY): 1115 SLP Time Calculation (min) (ACUTE ONLY): 15 min  Past Medical History:  Past Medical History:  Diagnosis Date   Acute metabolic encephalopathy 06/20/2022   Anemia    Anxiety    Arthritis    Benign neoplasm of colon    CHF (congestive heart failure) (HCC)    Chronic kidney disease    Chronic pain    Complication of anesthesia 1980's   hard time waking up   Coronary artery disease with unspecified angina pectoris    Cough    Diabetes mellitus without complication (HCC)    Essential hypertension    GERD (gastroesophageal reflux disease)    High risk medication use    Hyperlipidemia    Hypoglycemia 06/20/2022   Myocardial injury 06/20/2022   Plantar fascial fibromatosis    Presence of permanent cardiac pacemaker    Shortness of breath dyspnea    Past Surgical History:  Past Surgical History:  Procedure Laterality Date   ABDOMINAL HYSTERECTOMY     BACK SURGERY  1960's   cage and screws in lower back   CATARACT EXTRACTION W/ INTRAOCULAR LENS  IMPLANT, BILATERAL Bilateral    CORONARY ARTERY BYPASS GRAFT  2008   triple   HARDWARE REMOVAL Right 12/08/2019   Procedure: HARDWARE REMOVAL;  Surgeon: Kathlynn Sharper, MD;  Location: [facility] ORS;  Service: Orthopedics;  Laterality: Right;   JOINT REPLACEMENT Right    hip and knee   ORIF ANKLE FRACTURE Right 07/27/2014   Procedure: OPEN REDUCTION INTERNAL FIXATION (ORIF) ANKLE FRACTURE;  Surgeon: Norleen LITTIE Gavel, MD;  Location: [facility] OR;  Service: Orthopedics;  Laterality: Right;   ORIF TIBIA FRACTURE Right 12/08/2019   Procedure: OPEN REDUCTION INTERNAL FIXATION (ORIF) TIBIA FRACTURE;  Surgeon: Kathlynn Sharper, MD;  Location: [facility] ORS;  Service: Orthopedics;  Laterality: Right;   PACEMAKER INSERTION Left 03/14/2015   Procedure:  INSERTION PACEMAKER;  Surgeon: Marsa Dooms, MD;  Location: [facility] ORS;  Service: Cardiovascular;  Laterality: Left;   PACEMAKER LEADLESS INSERTION N/A 01/24/2020   Procedure: PACEMAKER LEADLESS INSERTION;  Surgeon: Dooms Marsa, MD;  Location: [facility];  Service: Cardiovascular;  Laterality: N/A;   ROTATOR CUFF REPAIR Left    TOTAL HIP ARTHROPLASTY Right    TOTAL HIP ARTHROPLASTY Left 02/09/2018   Procedure: TOTAL HIP ARTHROPLASTY ANTERIOR APPROACH;  Surgeon: Kathlynn Sharper, MD;  Location: [facility] OR[facility];  Service: Orthopedics;  Laterality: Left;   TOTAL KNEE ARTHROPLASTY Right    VEIN LIGATION AND STRIPPING     HPI:  Per H&P, Barby Colvard is a 89 y.o. female with medical history significant of chronic HFrEF with LVEF 30-35%, chronic hypoxic respiratory failure on 2 L home O2 continuously, IDDM, PPM, CAD status post CABG, HTN, HLD, chronic pain, OA status post left hip replacement, presented with fall and left hip pain.    Assessment / Plan / Recommendation  Clinical Impression  Pt seen for bedside swallow evaluation. Pt with hx of hiatal hernia/GERD. Pt is s/p debridement of right mandible on 06/18/2023. On 10/26/23 pt seen by oral surgery with complaint of gagging related to excess soft tissue on posterior alveolar ridge. Pt reporting that soft tissue present gags her. No surgery recommended at that time. Pt seen by SLP service in 04/2021 with last recommendation of nectar with puree related to AMS (  encephalopathy) and SOB. No current report of issue with PO intake.   Today, pt resting in chair. Pt on 3L nasal canula (chronically uses 2L) and edentulous. RN denied issue with medication pass. Pt seen with regular solids and thin liquids. No overt or subtle s/sx pharyngeal dysphagia noted. No change to vocal quality across trials. Oral phase min prolonged related to lack of dentition, though liquid wash aided oral clearance. Noted belching with intake- likely in congruence  with baseline GERD.   Suspect that pt is close to baseline oropharyngeal swallow function. Recommend continuation of regular solids and thin liquids with chopped meat and extra sauce. Based on acute on chronic deconditioning, limited mobility, esophogeal complications (GERD, hx of hiatal hernia) and respiratory status (chronic hypoxic respiratory failure), pt is at increased risk for aspiration. Recommend general aspiration precautions, slow rate, small bites, elevated HOB, and alert for PO intake. MD and RN aware of recommendations. No further acute SLP services indicated.   SLP Visit Diagnosis: Dysphagia, unspecified (R13.10)    Aspiration Risk  Mild aspiration risk    Diet Recommendation   Age appropriate regular (cut meats)  Medication Administration: Whole meds with liquid    Other  Recommendations Oral Care Recommendations: Oral care BID;Staff/trained caregiver to provide oral care     Assistance Recommended at Discharge  Intermittent supervision for PO intake  Functional Status Assessment Patient has not had a recent decline in their functional status    Swallow Study   General Date of Onset: 03/14/24 HPI: Per H&P, Shawneen Deetz is a 89 y.o. female with medical history significant of chronic HFrEF with LVEF 30-35%, chronic hypoxic respiratory failure on 2 L home O2 continuously, IDDM, PPM, CAD status post CABG, HTN, HLD, chronic pain, OA status post left hip replacement, presented with fall and left hip pain. Type of Study: Bedside Swallow Evaluation Previous Swallow Assessment: Pt seen by SLP service in 04/2021 with last recommendation of nectar with puree related to AMS (encephalopathy) and SOB. Diet Prior to this Study: Thin liquids (Level 0);Regular Temperature Spikes Noted: No Respiratory Status: Nasal cannula (3L) History of Recent Intubation: No Behavior/Cognition: Alert;Cooperative Oral Cavity Assessment: Within Functional Limits Oral Care Completed by SLP:  Recent completion by staff Oral Cavity - Dentition: Edentulous Vision: Functional for self-feeding Self-Feeding Abilities: Able to feed self Patient Positioning: Upright in chair Baseline Vocal Quality: Normal Volitional Cough: Strong Volitional Swallow: Unable to elicit    Oral/Motor/Sensory Function Overall Oral Motor/Sensory Function: Within functional limits   Ice Chips Ice chips: Not tested   Thin Liquid Thin Liquid: Within functional limits Presentation: Self Fed;Straw    Nectar Thick Nectar Thick Liquid: Not tested   Honey Thick Honey Thick Liquid: Not tested   Puree Puree: Not tested   Solid     Solid: Within functional limits Presentation: Self Fed     [place] Vashawn Ekstein Clapp, MS, CCC-SLP Speech Language Pathologist Rehab Services; [facility] [phone] (ascom)   [place] J Clapp 03/14/2024,12:37 PM

## 2024-03-14 NOTE — Progress Notes (Signed)
 Initial Nutrition Assessment  DOCUMENTATION CODES:   Obesity unspecified  INTERVENTION:   -Liberalize diet to 2 gram sodium forw ider variety of meal selections -MVI with minerals daily -Ensure Plus High Protein po BID, each supplement provides 350 kcal and 20 grams of protein   NUTRITION DIAGNOSIS:   Increased nutrient needs related to acute illness as evidenced by estimated needs.  GOAL:   Patient will meet greater than or equal to 90% of their needs  MONITOR:   PO intake, Supplement acceptance  REASON FOR ASSESSMENT:   Consult Assessment of nutrition requirement/status  ASSESSMENT:   Pt with medical history significant of chronic HFrEF with LVEF 30-35%, chronic hypoxic respiratory failure on 2 L home O2 continuously, IDDM, PPM, CAD status post CABG, HTN, HLD, chronic pain, OA status post left hip replacement, presented with fall and left hip pain.  Pt admitted with lt hip fracture secondary to mechanical fall.   Reviewed I/O's: +100 ml x 24 hours  Per MD notes, pt chronic ambulation impairment, uses wheelchair at baseline. Plan for non-operative treatment due to pt being minimally ambulatory at baseline.   Pt working with PT at time of visit. Unable to obtain good history.   Pt currently on a heart healthy, carb modified diet. Noted meal completions 0-90%.   Pt is DNR/ DNI.   Medications reviewed and include lasix  and neurontin .   Lab Results  Component Value Date   HGBA1C 7.9 (H) 03/13/2024   PTA DM medications are 5 units insulin  glargine daily and 10 mg jardiance  daily.   Labs reviewed: CBGS: 116-131 (inpatient orders for glycemic control are 0-9 units insulin  aspart TID with meals).    NUTRITION - FOCUSED PHYSICAL EXAM:  Flowsheet Row Most Recent Value  Orbital Region No depletion  Upper Arm Region No depletion  Thoracic and Lumbar Region No depletion  Buccal Region No depletion  Temple Region No depletion  Clavicle Bone Region No depletion   Clavicle and Acromion Bone Region No depletion  Scapular Bone Region No depletion  Dorsal Hand No depletion  Patellar Region No depletion  Anterior Thigh Region No depletion  Posterior Calf Region No depletion  Edema (RD Assessment) None  Hair Reviewed  Eyes Reviewed  Mouth Reviewed  Skin Reviewed  Nails Reviewed    Diet Order:   Diet Order             Diet 2 gram sodium Fluid consistency: Thin  Diet effective now                   EDUCATION NEEDS:   No education needs have been identified at this time  Skin:  Skin Assessment: Reviewed RN Assessment  Last BM:  Unknown  Height:   Ht Readings from Last 1 Encounters:  03/13/24 4' 7 (1.397 m)    Weight:   Wt Readings from Last 1 Encounters:  03/13/24 70.4 kg    Ideal Body Weight:  41.7 kg  BMI:  Body mass index is 36.07 kg/m.  Estimated Nutritional Needs:   Kcal:  1550-1750  Protein:  75-90 grams  Fluid:  1.5-1.7 L    Margery ORN, RD, LDN, CDCES Registered Dietitian III Certified Diabetes Care and Education Specialist If unable to reach this RD, please use RD Inpatient group chat on secure chat between hours of 8am-4 pm daily

## 2024-03-14 NOTE — Progress Notes (Signed)
 PROGRESS NOTE    Amber [name]  FMW:[id number] DOB: Aug 06, 1929 DOA: 03/13/2024 PCP: Amber [name] BIRCH, MD    Brief Narrative:   89 y.o. female with medical history significant of chronic HFrEF with LVEF 30-35%, chronic hypoxic respiratory failure on 2 L home O2 continuously, IDDM, PPM, CAD status post CABG, HTN, HLD, chronic pain, OA status post left hip replacement, presented with fall and left hip pain.   Patient has a chronic ambulation impairment, uses wheelchair at baseline, according to family she can briefly stand for few seconds and able to transfer from bed to wheelchair.  Last night however patient fell on left hip for the first time we will transfer her tomorrow wheelchair to toilet seat.  Family was able to pull her back to wheelchair but this morning she fell again on the same side hip and this time she developed excruciating pain and could not be moved. ED Course: Afebrile, nontachycardic blood pressure 169/80 O2 saturation 97%on 2 L.  X-ray showed comminuted left femoral periprosthetic fracture involving the left greater trochanter.   Assessment & Plan:   Principal Problem:   Hip fracture (HCC) Active Problems:   Hip fracture, unspecified laterality, closed, initial encounter (HCC)  Left hip fracture - Secondary to mechanical fall - Given patient's already acquired decreased baseline ambulatory function, orthopedic surgery offered conservative management. - Explained to the patient regarding complication associated with hip fracture, including pneumonia, atelectasis, UTI.  Confirmed with family that patient is DNR/DNI, family declined palliative care at this point. Plan: Therapy efforts Pain management SNF referral   Chronic HF R EF - Euvolemic - Continue Lasix , continue Entresto    COPD with chronic hypoxic respiratory failure - Incentive spirometry - Oxygen  level at baseline, no abdominal signs of acute exacerbation - Continue as needed DuoNebs    IDDM -SSI for now   Mixed HLD - Continue statin - Continue Zetia    Morbid obesity - BMI= 36 - Not interested in calorie control    DVT prophylaxis: SQH Code Status: DNR limited Family Communication: Daughter via phone 9/8 Disposition Plan: Status is: Inpatient Remains inpatient appropriate because: Hip fracture.  Nonoperative management.  Symptomatic care.  Need skilled nursing facility.   Level of care: Telemetry Medical  Consultants:  Orthopedics, signed off  Procedures:  None  Antimicrobials: None   Subjective: Seen and examined.  Sitting up in chair.  No distress.  Objective: Vitals:   03/13/24 2159 03/14/24 0330 03/14/24 0835 03/14/24 1004  BP:  (!) 159/72  (!) 142/73  Pulse:  66  60  Resp:  16  18  Temp:  98.9 F (37.2 C)  97.6 F (36.4 C)  TempSrc:  Oral  Oral  SpO2:  100% 98% 98%  Weight:      Height: 4' 7 (1.397 m)       Intake/Output Summary (Last 24 hours) at 03/14/2024 1435 Last data filed at 03/14/2024 1300 Gross per 24 hour  Intake 340 ml  Output --  Net 340 ml   Filed Weights   03/13/24 2001  Weight: 70.4 kg    Examination:  General exam: Appears chronically ill otherwise stable Respiratory system: Clear to auscultation. Respiratory effort normal. Cardiovascular system: S1-S2, RRR, no murmurs, no pedal edema Gastrointestinal system: Soft, NT/ND, normal bowel sounds Central nervous system: Alert and oriented. No focal neurological deficits. Extremities: Left lower extremity shortened and rotated Skin: No rashes, lesions or ulcers Psychiatry: Judgement and insight appear normal. Mood & affect appropriate.  Data Reviewed: I have personally reviewed following labs and imaging studies  CBC: Recent Labs  Lab 03/13/24 1256  WBC 7.3  NEUTROABS 5.4  HGB 9.4*  HCT 32.2*  MCV 70.9*  PLT 166   Basic Metabolic Panel: Recent Labs  Lab 03/13/24 1256  NA 139  K 3.8  CL 99  CO2 27  GLUCOSE 136*  BUN 22  CREATININE 0.75   CALCIUM  9.2   GFR: Estimated Creatinine Clearance: 35.1 mL/min (by C-G formula based on SCr of 0.75 mg/dL). Liver Function Tests: No results for input(s): AST, ALT, ALKPHOS, BILITOT, PROT, ALBUMIN  in the last 168 hours. No results for input(s): LIPASE, AMYLASE in the last 168 hours. No results for input(s): AMMONIA in the last 168 hours. Coagulation Profile: No results for input(s): INR, PROTIME in the last 168 hours. Cardiac Enzymes: No results for input(s): CKTOTAL, CKMB, CKMBINDEX, TROPONINI in the last 168 hours. BNP (last 3 results) No results for input(s): PROBNP in the last 8760 hours. HbA1C: Recent Labs    03/13/24 1256  HGBA1C 7.9*   CBG: Recent Labs  Lab 03/13/24 1644 03/13/24 2128 03/14/24 0801 03/14/24 1215  GLUCAP 131* 121* 116* 135*   Lipid Profile: No results for input(s): CHOL, HDL, LDLCALC, TRIG, CHOLHDL, LDLDIRECT in the last 72 hours. Thyroid  Function Tests: No results for input(s): TSH, T4TOTAL, FREET4, T3FREE, THYROIDAB in the last 72 hours. Anemia Panel: No results for input(s): VITAMINB12, FOLATE, FERRITIN, TIBC, IRON, RETICCTPCT in the last 72 hours. Sepsis Labs: No results for input(s): PROCALCITON, LATICACIDVEN in the last 168 hours.  No results found for this or any previous visit (from the past 240 hours).       Radiology Studies: DG Hip Unilat W or Wo Pelvis 2-3 Views Left Result Date: 03/13/2024 CLINICAL DATA:  Status post fall with left hip pain EXAM: DG HIP (WITH OR WITHOUT PELVIS) 3V LEFT COMPARISON:  Right hip and pelvis radiograph dated 09/01/2018 FINDINGS: Postsurgical changes of bilateral hip arthroplasties. Hardware appears intact and well seated. Comminuted fracture involving the left greater trochanter. No acute hip dislocation. IMPRESSION: Comminuted periprosthetic fracture involving the left greater trochanter. Electronically Signed   By: Limin  Xu M.D.   On:  03/13/2024 12:40        Scheduled Meds:  apixaban   5 mg Oral BID   carvedilol   6.25 mg Oral BID WC   empagliflozin   10 mg Oral Daily   ezetimibe   10 mg Oral Daily   feeding supplement  237 mL Oral BID BM   furosemide   20 mg Oral Daily   gabapentin   200 mg Oral TID   Influenza vac split trivalent PF  0.5 mL Intramuscular Tomorrow-1000   insulin  aspart  0-9 Units Subcutaneous TID WC   ipratropium-albuterol   3 mL Nebulization BID   multivitamin with minerals  1 tablet Oral Daily   mupirocin  ointment  1 Application Nasal BID   pantoprazole   40 mg Oral Daily   pravastatin   40 mg Oral QHS   sacubitril -valsartan   1 tablet Oral BID   Continuous Infusions:   LOS: 1 day      Calvin KATHEE Robson, MD [facility]   If 7PM-7AM, please contact night-coverage  03/14/2024, 2:35 PM

## 2024-03-15 DIAGNOSIS — S72002D Fracture of unspecified part of neck of left femur, subsequent encounter for closed fracture with routine healing: Secondary | ICD-10-CM | POA: Diagnosis not present

## 2024-03-15 LAB — GLUCOSE, CAPILLARY
Glucose-Capillary: 91 mg/dL (ref 70–99)
Glucose-Capillary: 166 mg/dL — ABNORMAL HIGH (ref 70–99)
Glucose-Capillary: 156 mg/dL — ABNORMAL HIGH (ref 70–99)
Glucose-Capillary: 232 mg/dL — ABNORMAL HIGH (ref 70–99)
Glucose-Capillary: 118 mg/dL — ABNORMAL HIGH (ref 70–99)

## 2024-03-15 MED ORDER — ENSURE PLUS HIGH PROTEIN PO LIQD: 237.0000 mL | Freq: Two times a day (BID) | ORAL | Status: AC

## 2024-03-15 NOTE — Plan of Care (Signed)
   Problem: Coping: Goal: Ability to adjust to condition or change in health will improve Outcome: Progressing   Problem: Fluid Volume: Goal: Ability to maintain a balanced intake and output will improve Outcome: Progressing

## 2024-03-15 NOTE — Progress Notes (Signed)
 PROGRESS NOTE    Alexander Mcauley  FMW:[id number] DOB: 02-19-1930 DOA: 03/13/2024 PCP: [name], MD    Brief Narrative:    89 y.o. female with medical history significant of chronic HFrEF with LVEF 30-35%, chronic hypoxic respiratory failure on 2 L home O2 continuously, IDDM, PPM, CAD status post CABG, HTN, HLD, chronic pain, OA status post left hip replacement, presented with fall and left hip pain.   Patient has a chronic ambulation impairment, uses wheelchair at baseline, according to family she can briefly stand for few seconds and able to transfer from bed to wheelchair.  Last night however patient fell on left hip for the first time we will transfer her tomorrow wheelchair to toilet seat.  Family was able to pull her back to wheelchair but this morning she fell again on the same side hip and this time she developed excruciating pain and could not be moved.  ED Course: Afebrile, nontachycardic blood pressure 169/80 O2 saturation 97%on 2 L.  X-ray showed comminuted left femoral periprosthetic fracture involving the left greater trochanter.   Assessment & Plan:   Principal Problem:   Hip fracture (HCC) Active Problems:   Hip fracture, unspecified laterality, closed, initial encounter (HCC)  Left hip fracture - Secondary to mechanical fall - Given patient's already acquired decreased baseline ambulatory function, orthopedic surgery offered conservative management. - Explained to the patient regarding complication associated with hip fracture, including pneumonia, atelectasis, UTI.  Confirmed with family that patient is DNR/DNI, family declined palliative care at this point. Plan: Therapy efforts Pain management SNF referral Medically stable for discharge   Chronic HF R EF - Euvolemic - Continue Lasix , continue Entresto    COPD with chronic hypoxic respiratory failure - Incentive spirometry - Oxygen  level at baseline, no abdominal signs of acute exacerbation -  Continue as needed DuoNebs   IDDM -SSI for now   Mixed HLD - Continue statin - Continue Zetia    Morbid obesity - BMI= 36 - Not interested in calorie control    DVT prophylaxis: SQH Code Status: DNR limited Family Communication: Daughter via phone 9/8 Disposition Plan: Status is: Inpatient Remains inpatient appropriate because: Hip fracture.  Nonoperative management.  Symptomatic care.  Need skilled nursing facility.  Medically stable   Level of care: Telemetry Medical  Consultants:  Orthopedics, signed off  Procedures:  None  Antimicrobials: None   Subjective: Seen and examined.  Sitting up in chair.  No distress  Objective: Vitals:   03/14/24 2014 03/15/24 0433 03/15/24 0739 03/15/24 0831  BP: (!) 149/69 139/66  119/89  Pulse: 64 62  65  Resp: 17 17  16   Temp: 97.6 F (36.4 C) 97.8 F (36.6 C)  98.2 F (36.8 C)  TempSrc: Oral Oral  Oral  SpO2: 100% 100% 94% 100%  Weight:      Height:        Intake/Output Summary (Last 24 hours) at 03/15/2024 1442 Last data filed at 03/15/2024 0849 Gross per 24 hour  Intake --  Output 1200 ml  Net -1200 ml   Filed Weights   03/13/24 2001  Weight: 70.4 kg    Examination:  General exam: Appears frail and chronically otherwise stable Respiratory system: Clear to auscultation. Respiratory effort normal. Cardiovascular system: S1-S2, RRR, no murmurs, no pedal edema Gastrointestinal system: Soft, NT/ND, normal bowel sounds Central nervous system: Alert and oriented. No focal neurological deficits. Extremities: Left lower extremity shortened and rotated Skin: No rashes, lesions or ulcers Psychiatry: Judgement and insight appear  normal. Mood & affect appropriate.     Data Reviewed: I have personally reviewed following labs and imaging studies  CBC: Recent Labs  Lab 03/13/24 1256  WBC 7.3  NEUTROABS 5.4  HGB 9.4*  HCT 32.2*  MCV 70.9*  PLT 166   Basic Metabolic Panel: Recent Labs  Lab 03/13/24 1256  NA  139  K 3.8  CL 99  CO2 27  GLUCOSE 136*  BUN 22  CREATININE 0.75  CALCIUM  9.2   GFR: Estimated Creatinine Clearance: 35.1 mL/min (by C-G formula based on SCr of 0.75 mg/dL). Liver Function Tests: No results for input(s): AST, ALT, ALKPHOS, BILITOT, PROT, ALBUMIN  in the last 168 hours. No results for input(s): LIPASE, AMYLASE in the last 168 hours. No results for input(s): AMMONIA in the last 168 hours. Coagulation Profile: No results for input(s): INR, PROTIME in the last 168 hours. Cardiac Enzymes: No results for input(s): CKTOTAL, CKMB, CKMBINDEX, TROPONINI in the last 168 hours. BNP (last 3 results) No results for input(s): PROBNP in the last 8760 hours. HbA1C: Recent Labs    03/13/24 1256  HGBA1C 7.9*   CBG: Recent Labs  Lab 03/14/24 1215 03/14/24 1808 03/15/24 0816 03/15/24 1201 03/15/24 1334  GLUCAP 135* 112* 91 166* 156*   Lipid Profile: No results for input(s): CHOL, HDL, LDLCALC, TRIG, CHOLHDL, LDLDIRECT in the last 72 hours. Thyroid  Function Tests: No results for input(s): TSH, T4TOTAL, FREET4, T3FREE, THYROIDAB in the last 72 hours. Anemia Panel: No results for input(s): VITAMINB12, FOLATE, FERRITIN, TIBC, IRON, RETICCTPCT in the last 72 hours. Sepsis Labs: No results for input(s): PROCALCITON, LATICACIDVEN in the last 168 hours.  No results found for this or any previous visit (from the past 240 hours).       Radiology Studies: No results found.       Scheduled Meds:  apixaban   5 mg Oral BID   carvedilol   6.25 mg Oral BID WC   empagliflozin   10 mg Oral Daily   ezetimibe   10 mg Oral Daily   feeding supplement  237 mL Oral BID BM   furosemide   20 mg Oral Daily   gabapentin   200 mg Oral TID   Influenza vac split trivalent PF  0.5 mL Intramuscular Tomorrow-1000   insulin  aspart  0-9 Units Subcutaneous TID WC   ipratropium-albuterol   3 mL Nebulization BID   multivitamin  with minerals  1 tablet Oral Daily   mupirocin  ointment  1 Application Nasal BID   pantoprazole   40 mg Oral Daily   pravastatin   40 mg Oral QHS   sacubitril -valsartan   1 tablet Oral BID   Continuous Infusions:   LOS: 2 days      Calvin KATHEE Robson, MD [facility]   If 7PM-7AM, please contact night-coverage  03/15/2024, 2:42 PM

## 2024-03-15 NOTE — Progress Notes (Signed)
 Physical Therapy Treatment Patient Details Name: Amber [name] MRN: [id number] DOB: Nov 25, 1929 Today's Date: 03/15/2024   History of Present Illness 89 year old minimally ambulatory female status post ground-level fall with closed nondisplaced comminuted left greater trochanter periprosthetic fractures. WBAT to LLE. Non-surgical interventions at this point. PMH: chronic HFrEF with LVEF 30-35%, chronic hypoxic respiratory failure on 2 L home O2 continuously, IDDM, PPM, CAD status post CABG, HTN, HLD, chronic pain    PT Comments  Pt was long sitting in bed with RN tech at bedside taking vitals. Pt had incontinence episode in bed. Pt is alert and able to follow commands however has cognition deficits observed. I live with my mom and dad Pt agreed to PT session and OOB activity to allow bed linen change and hygiene care. Pt also unaware she had not only urinated in bed but also had BM. Pt stood 2 x EOB with bed height elevated prior to stand stand pivot to recliner from EOB surface. Poor standing posture observed with vsc for correction throughout. Pt struggles to accept wt on LLE however with vcs and assistance, was able to clear RLE from floor. Pt is overall limited by pain and fatigue. DC recs remain appropriate. Acute PT will continue to follow and progress per current POC. Pt will benefit form th post acute PT setting to maximize her independence and safety with all ADLs.    If plan is discharge home, recommend the following: Two people to help with walking and/or transfers;Two people to help with bathing/dressing/bathroom;Assistance with cooking/housework;Assistance with feeding;Direct supervision/assist for medications management;Direct supervision/assist for financial management;Assist for transportation;Help with stairs or ramp for entrance;Supervision due to cognitive status     Equipment Recommendations  Other (comment) (Defer to next level of care)       Precautions / Restrictions  Precautions Recall of Precautions/Restrictions: Impaired Restrictions Weight Bearing Restrictions Per Provider Order: Yes LLE Weight Bearing Per Provider Order: Weight bearing as tolerated     Mobility  Bed Mobility Overal bed mobility: Needs Assistance Bed Mobility: Supine to Sit  Supine to sit: Max assist, HOB elevated, Used rails   Transfers Overall transfer level: Needs assistance Equipment used: Rolling walker (2 wheels) Transfers: Sit to/from Stand, Bed to chair/wheelchair/BSC Sit to Stand: Mod assist, Max assist, From elevated surface Stand pivot transfers: Max assist, From elevated surface  General transfer comment: Pt stood EOB 2 x to have hygiene care performed after having BM. on 3rd STS pt perfromed stnd pivot with max assist + vcs. poor standing posture throughout.    Ambulation/Gait Ambulation/Gait assistance: Max assist Gait Distance (Feet): 3 Feet Assistive device: Rolling walker (2 wheels) Gait Pattern/deviations: Step-to pattern, Shuffle, Antalgic, Decreased step length - left Gait velocity: decreased  General Gait Details: Pt struggles to clear RLE due to poor wt acceptance/pain on LLE    Balance Overall balance assessment: Needs assistance Sitting-balance support: Bilateral upper extremity supported, Feet supported Sitting balance-Leahy Scale: Fair     Standing balance support: Bilateral upper extremity supported, During functional activity, Reliant on assistive device for balance Standing balance-Leahy Scale: Poor Standing balance comment: pt remains high fall risk      Communication Communication Communication: Impaired Factors Affecting Communication: Hearing impaired  Cognition Arousal: Alert Behavior During Therapy: WFL for tasks assessed/performed   PT - Cognitive impairments: History of cognitive impairments    PT - Cognition Comments: Pt knew she was in the hospital however unaware of situation or able to sttae who she lives with. I live  with  my mom and dad. Following commands: Intact      Cueing Cueing Techniques: Verbal cues, Tactile cues         Pertinent Vitals/Pain Pain Assessment Pain Assessment: 0-10 Pain Score: 3      PT Goals (current goals can now be found in the care plan section) Acute Rehab PT Goals Patient Stated Goal: get better Progress towards PT goals: Progressing toward goals    Frequency    Min 1X/week           Co-evaluation     PT goals addressed during session: Mobility/safety with mobility;Balance;Proper use of DME;Strengthening/ROM        AM-PAC PT 6 Clicks Mobility   Outcome Measure  Help needed turning from your back to your side while in a flat bed without using bedrails?: A Lot Help needed moving from lying on your back to sitting on the side of a flat bed without using bedrails?: A Lot Help needed moving to and from a bed to a chair (including a wheelchair)?: A Lot Help needed standing up from a chair using your arms (e.g., wheelchair or bedside chair)?: A Lot Help needed to walk in hospital room?: A Lot Help needed climbing 3-5 steps with a railing? : Total 6 Click Score: 11    End of Session Equipment Utilized During Treatment: Gait belt Activity Tolerance: Patient tolerated treatment well;Patient limited by pain Patient left: in chair;with call bell/phone within reach;with chair alarm set Nurse Communication: Mobility status;Precautions;Weight bearing status PT Visit Diagnosis: Other abnormalities of gait and mobility (R26.89);Repeated falls (R29.6);Muscle weakness (generalized) (M62.81);History of falling (Z91.81);Difficulty in walking, not elsewhere classified (R26.2);Pain Pain - Right/Left: Left Pain - part of body: Hip     Time: 9173-9153 PT Time Calculation (min) (ACUTE ONLY): 20 min  Charges:    $Therapeutic Activity: 8-22 mins PT General Charges $$ ACUTE PT VISIT: 1 Visit                    Rankin Essex PTA 03/15/24, 9:32 AM

## 2024-03-16 DIAGNOSIS — D649 Anemia, unspecified: Secondary | ICD-10-CM

## 2024-03-16 DIAGNOSIS — M9702XS Periprosthetic fracture around internal prosthetic left hip joint, sequela: Secondary | ICD-10-CM

## 2024-03-16 DIAGNOSIS — J9611 Chronic respiratory failure with hypoxia: Secondary | ICD-10-CM

## 2024-03-16 DIAGNOSIS — I5022 Chronic systolic (congestive) heart failure: Secondary | ICD-10-CM

## 2024-03-16 DIAGNOSIS — E669 Obesity, unspecified: Secondary | ICD-10-CM

## 2024-03-16 DIAGNOSIS — E785 Hyperlipidemia, unspecified: Secondary | ICD-10-CM

## 2024-03-16 DIAGNOSIS — J439 Emphysema, unspecified: Secondary | ICD-10-CM | POA: Diagnosis not present

## 2024-03-16 DIAGNOSIS — M9702XA Periprosthetic fracture around internal prosthetic left hip joint, initial encounter: Secondary | ICD-10-CM

## 2024-03-16 LAB — GLUCOSE, CAPILLARY
Glucose-Capillary: 110 mg/dL — ABNORMAL HIGH (ref 70–99)
Glucose-Capillary: 207 mg/dL — ABNORMAL HIGH (ref 70–99)
Glucose-Capillary: 168 mg/dL — ABNORMAL HIGH (ref 70–99)

## 2024-03-16 MED ORDER — GABAPENTIN 100 MG PO CAPS: 200.0000 mg | ORAL_CAPSULE | Freq: Three times a day (TID) | ORAL | 0 refills | Status: AC

## 2024-03-16 MED ORDER — INSULIN GLARGINE 100 UNIT/ML ~~LOC~~ SOLN
4.0000 [IU] | Freq: Every day | SUBCUTANEOUS | Status: DC
  Administered 2024-03-16: 4 [IU] via SUBCUTANEOUS
  Filled 2024-03-16: qty 0.04

## 2024-03-16 MED ORDER — INSULIN GLARGINE 100 UNIT/ML ~~LOC~~ SOLN: 4.0000 [IU] | Freq: Every day | SUBCUTANEOUS | Status: AC

## 2024-03-16 MED ORDER — OXYCODONE HCL 5 MG PO TABS: 5.0000 mg | ORAL_TABLET | Freq: Four times a day (QID) | ORAL | 0 refills | Status: AC | PRN

## 2024-03-16 NOTE — Assessment & Plan Note (Signed)
-   Continue Zetia and statin 

## 2024-03-16 NOTE — TOC Progression Note (Signed)
 Transition of Care [facility]) - Progression Note    Patient Details  Name: Amber [name] MRN: [id number] Date of Birth: 16-Jul-1929  [facility]) CM/SW Contact  Alvaro Louder, [place] Phone Number: 03/16/2024, 1:17 PM  Clinical Narrative:   Patient and family selected [facility]. Auth started for [facility] to [facility].  Pending [id number]   TOC to follow for discharge                    Expected Discharge Plan and Services         Expected Discharge Date: 03/15/24                                     Social Drivers of Health (SDOH) Interventions SDOH Screenings   Food Insecurity: No Food Insecurity (03/13/2024)  Housing: Low Risk  (03/13/2024)  Transportation Needs: No Transportation Needs (03/13/2024)  Utilities: Not At Risk (03/13/2024)  Depression (PHQ2-9): Low Risk  (06/25/2023)  Financial Resource Strain: Low Risk  (08/25/2023)   Received from [facility]  Social Connections: Moderately Integrated (03/13/2024)  Tobacco Use: Low Risk  (03/13/2024)    Readmission Risk Interventions     No data to display

## 2024-03-16 NOTE — Care Management Important Message (Signed)
 Important Message  Patient Details  Name: Amber [name] MRN: [id number] Date of Birth: [birth year]   Important Message Given:  Yes - Medicare IM     [name] W, CMA 03/16/2024, 11:43 AM

## 2024-03-16 NOTE — Assessment & Plan Note (Signed)
 Uncontrolled with a hemoglobin A1c of 7.9.  Patient on sliding scale insulin  here we will add back her long-acting insulin  5 units

## 2024-03-16 NOTE — TOC Transition Note (Signed)
 Transition of Care [facility]) - Discharge Note   Patient Details  Name: Amber [name] MRN: [id number] Date of Birth: 1930/07/03  [facility]) CM/SW Contact:  Alvaro Louder, LCSW Phone Number: 03/16/2024, 3:45 PM   Clinical Narrative:     LCSWA received insurance approval for patient to admit to SNF [facility]. LCSWA confirmed with MD that patient is stable for discharge. LCSWA notified the patient and they are in agreement with discharge. LCSWA confirmed bed is available at SNF. Transport arranged with [facility] for next available.  RM E-1, Number to call report [phone]   [facility] Signing off  Final next level of care: Skilled Nursing Facility Barriers to Discharge: No Barriers Identified   Patient Goals and CMS Choice            Discharge Placement              Patient chooses bed at:  ([facility]) Patient to be transferred to facility by: [facility] Name of family member notified: Lonell Patient and family notified of of transfer: 03/16/24  Discharge Plan and Services Additional resources added to the After Visit Summary for                                       Social Drivers of Health (SDOH) Interventions SDOH Screenings   Food Insecurity: No Food Insecurity (03/13/2024)  Housing: Low Risk  (03/13/2024)  Transportation Needs: No Transportation Needs (03/13/2024)  Utilities: Not At Risk (03/13/2024)  Depression (PHQ2-9): Low Risk  (06/25/2023)  Financial Resource Strain: Low Risk  (08/25/2023)   [facility]  Social Connections: Moderately Integrated (03/13/2024)  Tobacco Use: Low Risk  (03/13/2024)     Readmission Risk Interventions     No data to display

## 2024-03-16 NOTE — Discharge Summary (Signed)
 Physician Discharge Summary   Patient: Amber [name] MRN: [id number] DOB: 12-29-1929  Admit date:     03/13/2024  Discharge date: 03/16/24  Discharge Physician: Charlie Patterson   PCP: [name], MD   Recommendations at discharge:   Follow-up team at rehab 5 days Check CBC and BMP 1 week.  Discharge Diagnoses: Principal Problem:   Periprosthetic fracture around internal prosthetic left hip joint (HCC) Active Problems:   Chronic HFrEF (heart failure with reduced ejection fraction) (HCC)   COPD (chronic obstructive pulmonary disease) (HCC)   Chronic respiratory failure with hypoxia (HCC)   Hyperlipidemia, unspecified   Obesity (BMI 30-39.9)   Anemia, unspecified   Hospital Course: 89 y.o. female with medical history significant of chronic HFrEF with LVEF 30-35%, chronic hypoxic respiratory failure on 2 L home O2 continuously, IDDM, PPM, CAD status post CABG, HTN, HLD, chronic pain, OA status post left hip replacement, presented with fall and left hip pain.   Patient has a chronic ambulation impairment, uses wheelchair at baseline, according to family she can briefly stand for few seconds and able to transfer from bed to wheelchair.  Last night however patient fell on left hip for the first time we will transfer her tomorrow wheelchair to toilet seat.  Family was able to pull her back to wheelchair but this morning she fell again on the same side hip and this time she developed excruciating pain and could not be moved.   ED Course: Afebrile, nontachycardic blood pressure 169/80 O2 saturation 97%on 2 L.  X-ray showed comminuted left femoral periprosthetic fracture involving the left greater trochanter.  9/10.  Notified that insurance authorization for rehab started  Assessment and Plan: * Periprosthetic fracture around internal prosthetic left hip joint (HCC) Seen by orthopedic and recommended nonoperative management.  PT and OT consultations appreciated, pain controlled.   Patient will need rehab.  Patient on Eliquis  for anticoagulation.  Obtained insurance authorization.  Chronic HFrEF (heart failure with reduced ejection fraction) (HCC) Last echo is 30% EF.  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  IDDM (insulin  dependent diabetes mellitus) Uncontrolled with a hemoglobin A1c of 7.9.  Patient on sliding scale insulin  here we will add back her long-acting insulin  5 units         Consultants: Orthopedic surgery Procedures performed: None Disposition: Rehabilitation facility Diet recommendation:  Discharge Diet Orders (From admission, onward)     Start     Ordered   03/15/24 0000  Diet - low sodium heart healthy        03/15/24 1445            DISCHARGE MEDICATION: Allergies as of 03/16/2024       Reactions   Ambien [zolpidem Tartrate] Other (See Comments)   Reaction:  Keeps pt awake    Penicillins Itching, Other (See Comments)   Has patient had a PCN reaction causing immediate rash, facial/tongue/throat swelling, SOB or lightheadedness with hypotension: No Has patient had a PCN reaction causing severe rash involving mucus membranes or skin necrosis: No Has patient had a PCN reaction that required hospitalization No Has patient had a PCN reaction occurring within the last 10 years: No If all of the above answers are NO, then may proceed with  Cephalosporin use. Other Reaction(s): Not available   Iodine Itching   Succinylcholine Other (See Comments)   Reaction:  Unknown    Etodolac Other (See Comments)   GI upset   Nsaids Other (See Comments), Nausea And Vomiting   Reaction: gi upset Takes BC powders         Medication List     TAKE these medications    acetaminophen  325 MG tablet Commonly known as: TYLENOL  Take 2 tablets (650 mg total) by mouth every 6 (six) hours as needed for mild pain (or Fever >/= 101).   albuterol  108 (90 Base) MCG/ACT inhaler Commonly known as: VENTOLIN  HFA Inhale 2 puffs into the lungs every 6 (six) hours as needed for wheezing or shortness of breath.   alendronate  70 MG tablet Commonly known as: FOSAMAX  Take 70 mg by mouth once a week.   apixaban  5 MG Tabs tablet Commonly known as: ELIQUIS  Take 5 mg by mouth 2 (two) times daily.   calcium -vitamin D  500-200 MG-UNIT tablet Commonly known as: OSCAL WITH D Take 1 tablet by mouth every other day.   carvedilol  6.25 MG tablet Commonly known as: COREG  Take 1 tablet (6.25 mg total) by mouth 2 (two) times daily with a meal.   empagliflozin  10 MG Tabs tablet Commonly known as: JARDIANCE  Take 10 mg by mouth daily.   Entresto  24-26 MG Generic drug: sacubitril -valsartan  Take 1 tablet by mouth twice daily   ezetimibe  10 MG tablet Commonly known as: ZETIA  Take 1 tablet by mouth daily.   feeding supplement Liqd Take 237 mLs by mouth 2 (two) times daily between meals.   furosemide  20 MG tablet Commonly known as: Lasix  Take 1 tablet (20 mg total) by mouth 2 (two) times daily. What changed: when to take this   gabapentin  100 MG capsule Commonly known as: NEURONTIN  Take 2 capsules (200 mg total) by mouth 3 (three) times daily.   insulin  glargine 100 UNIT/ML injection Commonly known as: LANTUS  Inject 0.04 mLs (4 Units total) into the skin at bedtime. Hold if blood sugar less than 120 or if not eating well. What changed:  how much to take when to take this   ipratropium-albuterol  0.5-2.5 (3) MG/3ML Soln Commonly known as: DUONEB Take 3 mLs by nebulization 4 (four) times daily.   omeprazole 40 MG capsule Commonly known as: PRILOSEC Take 40 mg by mouth daily.   oxyCODONE  5 MG immediate release  tablet Commonly known as: Roxicodone  Take 1 tablet (5 mg total) by mouth every 6 (six) hours as needed for severe pain (pain score 7-10).   polyethylene glycol 17 g packet Commonly known as: MIRALAX  / GLYCOLAX  Take 17 g by mouth daily as needed for mild constipation.   pravastatin  40 MG tablet Commonly known as: PRAVACHOL  Take 40 mg by mouth at bedtime.   PreserVision AREDS 2 Caps Take 1 capsule by mouth every other day.        Discharge Exam: Filed Weights   03/13/24 2001  Weight: 70.4 kg   Physical Exam HENT:     Head: Normocephalic.  Eyes:     General: Lids are normal.     Conjunctiva/sclera: Conjunctivae normal.  Cardiovascular:     Rate and Rhythm: Normal rate and regular rhythm.     Heart sounds: Normal heart sounds, S1 normal and S2 normal.  Pulmonary:     Breath sounds: No decreased breath sounds, wheezing, rhonchi or rales.  Abdominal:     Palpations: Abdomen is soft.  Tenderness: There is no abdominal tenderness.  Musculoskeletal:     Right lower leg: No swelling.     Left lower leg: No swelling.  Skin:    General: Skin is warm.     Findings: No rash.  Neurological:     Mental Status: She is alert and oriented to person, place, and time.     Comments: Unable to straight leg raise with left leg      Condition at discharge: stable  The results of significant diagnostics from this hospitalization (including imaging, microbiology, ancillary and laboratory) are listed below for reference.   Imaging Studies: DG Hip Unilat W or Wo Pelvis 2-3 Views Left Result Date: 03/13/2024 CLINICAL DATA:  Status post fall with left hip pain EXAM: DG HIP (WITH OR WITHOUT PELVIS) 3V LEFT COMPARISON:  Right hip and pelvis radiograph dated 09/01/2018 FINDINGS: Postsurgical changes of bilateral hip arthroplasties. Hardware appears intact and well seated. Comminuted fracture involving the left greater trochanter. No acute hip dislocation. IMPRESSION: Comminuted periprosthetic  fracture involving the left greater trochanter. Electronically Signed   By: Limin  Xu M.D.   On: 03/13/2024 12:40    Labs: CBC: Recent Labs  Lab 03/13/24 1256  WBC 7.3  NEUTROABS 5.4  HGB 9.4*  HCT 32.2*  MCV 70.9*  PLT 166   Basic Metabolic Panel: Recent Labs  Lab 03/13/24 1256  NA 139  K 3.8  CL 99  CO2 27  GLUCOSE 136*  BUN 22  CREATININE 0.75  CALCIUM  9.2   Liver Function Tests: No results for input(s): AST, ALT, ALKPHOS, BILITOT, PROT, ALBUMIN  in the last 168 hours. CBG: Recent Labs  Lab 03/15/24 1334 03/15/24 1731 03/15/24 2128 03/16/24 0926 03/16/24 1217  GLUCAP 156* 232* 118* 110* 207*    Discharge time spent: greater than 30 minutes.  Signed: Charlie Patterson, MD [facility] 03/16/2024

## 2024-03-16 NOTE — Progress Notes (Signed)
 Progress Note   Patient: Amber [name] FMW:[id number] DOB: 11-07-1929 DOA: 03/13/2024     3 DOS: the patient was seen and examined on 03/16/2024   Brief hospital course: 89 y.o. female with medical history significant of chronic HFrEF with LVEF 30-35%, chronic hypoxic respiratory failure on 2 L home O2 continuously, IDDM, PPM, CAD status post CABG, HTN, HLD, chronic pain, OA status post left hip replacement, presented with fall and left hip pain.   Patient has a chronic ambulation impairment, uses wheelchair at baseline, according to family she can briefly stand for few seconds and able to transfer from bed to wheelchair.  Last night however patient fell on left hip for the first time we will transfer her tomorrow wheelchair to toilet seat.  Family was able to pull her back to wheelchair but this morning she fell again on the same side hip and this time she developed excruciating pain and could not be moved.   ED Course: Afebrile, nontachycardic blood pressure 169/80 O2 saturation 97%on 2 L.  X-ray showed comminuted left femoral periprosthetic fracture involving the left greater trochanter.  9/10.  Notified that insurance authorization for rehab started  Assessment and Plan: * Periprosthetic fracture around internal prosthetic left hip joint (HCC) Seen by orthopedic and recommended nonoperative management.  PT and OT consultations appreciated, pain controlled.  Patient will need rehab.  Chronic HFrEF (heart failure with reduced ejection fraction) (HCC) Last echo is 30% EF.  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  Check hemoglobin tomorrow and check her ferritin  IDDM  (insulin  dependent diabetes mellitus) Uncontrolled with a hemoglobin A1c of 7.9.  Patient on sliding scale insulin  here we will add back her long-acting insulin  5 units        Subjective: Patient feels okay.  Some hip pain.  Came in with hip fracture.  Physical Exam: Vitals:   03/15/24 1936 03/15/24 2051 03/16/24 0436 03/16/24 0929  BP: (!) 109/59  (!) 112/58 (!) 108/59  Pulse: 62  65 93  Resp: 17  17 18   Temp: 98 F (36.7 C)  98 F (36.7 C) 98.3 F (36.8 C)  TempSrc: Oral  Oral   SpO2: 99% 100% 100% 93%  Weight:      Height:       Physical Exam HENT:     Head: Normocephalic.  Eyes:     General: Lids are normal.     Conjunctiva/sclera: Conjunctivae normal.  Cardiovascular:     Rate and Rhythm: Normal rate and regular rhythm.     Heart sounds: Normal heart sounds, S1 normal and S2 normal.  Pulmonary:     Breath sounds: No decreased breath sounds, wheezing, rhonchi or rales.  Abdominal:     Palpations: Abdomen is soft.     Tenderness: There is no abdominal tenderness.  Musculoskeletal:     Right lower leg: No swelling.     Left lower leg: No swelling.  Skin:    General: Skin is warm.     Findings: No rash.  Neurological:     Mental Status: She is alert and oriented to person, place, and time.     Comments: Unable to straight leg raise with left leg     Data Reviewed: Hemoglobin  A1c 7.9, creatinine 0.75, hemoglobin 9.4 Family Communication: Updated daughter on phone  Disposition: Status is: Inpatient Remains inpatient appropriate because: Insurance authorization started for rehab  Planned Discharge Destination: Rehab    Time spent: 28 minutes  Author: Charlie Patterson, MD 03/16/2024 1:52 PM  For on call review [email or web address].

## 2024-03-16 NOTE — Assessment & Plan Note (Signed)
Respiratory status stable. 

## 2024-03-16 NOTE — Progress Notes (Signed)
 Occupational Therapy Treatment Patient Details Name: Amber [name] MRN: [id number] DOB: January 07, 1930 Today's Date: 03/16/2024   History of present illness 89 year old minimally ambulatory female status post ground-level fall with closed nondisplaced comminuted left greater trochanter periprosthetic fractures. WBAT to LLE. Non-surgical interventions at this point. PMH: chronic HFrEF with LVEF 30-35%, chronic hypoxic respiratory failure on 2 L home O2 continuously, IDDM, PPM, CAD status post CABG, HTN, HLD, chronic pain   OT comments  [name] was seen for OT treatment on this date. Upon arrival to room pt in bed, agreeable to tx. Pt requires MAX A sup<>sit. MOD A + RW sit<>stand x3 trials. MAX A pericare standing. Unable to take steps or shuffle feet, returned to bed. Pt making progress toward goals, will continue to follow POC. Discharge recommendation remains appropriate.        If plan is discharge home, recommend the following:  Two people to help with walking and/or transfers;Two people to help with bathing/dressing/bathroom;Help with stairs or ramp for entrance   Equipment Recommendations  BSC/3in1    Recommendations for Other Services      Precautions / Restrictions Precautions Precautions: None Recall of Precautions/Restrictions: Impaired Restrictions Weight Bearing Restrictions Per Provider Order: Yes LLE Weight Bearing Per Provider Order: Weight bearing as tolerated       Mobility Bed Mobility Overal bed mobility: Needs Assistance Bed Mobility: Supine to Sit, Sit to Supine     Supine to sit: Max assist, HOB elevated, Used rails Sit to supine: Max assist        Transfers Overall transfer level: Needs assistance Equipment used: Rolling walker (2 wheels) Transfers: Sit to/from Stand Sit to Stand: Mod assist                 Balance Overall balance assessment: Needs assistance Sitting-balance support: Bilateral upper extremity supported, Feet  supported Sitting balance-Leahy Scale: Fair     Standing balance support: Bilateral upper extremity supported, During functional activity, Reliant on assistive device for balance Standing balance-Leahy Scale: Poor                             ADL either performed or assessed with clinical judgement   ADL Overall ADL's : Needs assistance/impaired                                       General ADL Comments: MAX A don B socks sititng. MAX A pericare standing     Communication Communication Communication: Impaired   Cognition Arousal: Alert Behavior During Therapy: WFL for tasks assessed/performed Cognition: No family/caregiver present to determine baseline                               Following commands: Intact        Cueing      Exercises      Shoulder Instructions       General Comments      Pertinent Vitals/ Pain       Pain Assessment Pain Assessment: Faces Faces Pain Scale: Hurts little more Pain Location: L hip Pain Descriptors / Indicators: Discomfort Pain Intervention(s): Limited activity within patient's tolerance, Repositioned   Frequency  Min 2X/week        Progress Toward Goals  OT Goals(current goals can now be found in the care  plan section)  Progress towards OT goals: Progressing toward goals  Acute Rehab OT Goals OT Goal Formulation: With patient Time For Goal Achievement: 03/28/24 Potential to Achieve Goals: Good ADL Goals Pt Will Perform Grooming: with set-up;with supervision;sitting Pt Will Perform Lower Body Dressing: with mod assist;sit to/from stand Pt Will Transfer to Toilet: with mod assist;stand pivot transfer;bedside commode  Plan      Co-evaluation                 AM-PAC OT 6 Clicks Daily Activity     Outcome Measure   Help from another person eating meals?: None Help from another person taking care of personal grooming?: A Little Help from another person toileting,  which includes using toliet, bedpan, or urinal?: A Lot Help from another person bathing (including washing, rinsing, drying)?: A Lot Help from another person to put on and taking off regular upper body clothing?: A Little Help from another person to put on and taking off regular lower body clothing?: A Lot 6 Click Score: 16    End of Session Equipment Utilized During Treatment: Rolling walker (2 wheels)  OT Visit Diagnosis: Other abnormalities of gait and mobility (R26.89);Muscle weakness (generalized) (M62.81)   Activity Tolerance Patient tolerated treatment well   Patient Left in bed;with call bell/phone within reach;with bed alarm set   Nurse Communication          Time: 8472-8459 OT Time Calculation (min): 13 min  Charges: OT General Charges $OT Visit: 1 Visit OT Treatments $Self Care/Home Management : 8-22 mins  Elston Slot, M.S. OTR/L  03/16/24, 4:12 PM  ascom [phone]

## 2024-03-16 NOTE — Assessment & Plan Note (Signed)
Patient on chronic oxygen

## 2024-03-16 NOTE — Hospital Course (Signed)
 89 y.o. female with medical history significant of chronic HFrEF with LVEF 30-35%, chronic hypoxic respiratory failure on 2 L home O2 continuously, IDDM, PPM, CAD status post CABG, HTN, HLD, chronic pain, OA status post left hip replacement, presented with fall and left hip pain.   Patient has a chronic ambulation impairment, uses wheelchair at baseline, according to family she can briefly stand for few seconds and able to transfer from bed to wheelchair.  Last night however patient fell on left hip for the first time we will transfer her tomorrow wheelchair to toilet seat.  Family was able to pull her back to wheelchair but this morning she fell again on the same side hip and this time she developed excruciating pain and could not be moved.   ED Course: Afebrile, nontachycardic blood pressure 169/80 O2 saturation 97%on 2 L.  X-ray showed comminuted left femoral periprosthetic fracture involving the left greater trochanter.  9/10.  Notified that insurance authorization for rehab started

## 2024-03-16 NOTE — Assessment & Plan Note (Signed)
 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

## 2024-03-16 NOTE — [place] FL2 (Signed)
 [place]  MEDICAID [id number] LEVEL OF CARE FORM     IDENTIFICATION  Patient Name: Amber [name] Birthdate: [birth year] Sex: female Admission Date (Current Location): 03/13/2024  [facility] and [place] Number:  Chiropodist and Address:  [facility], [address], [place], [place] [address]      Provider Number: [id number]  Attending Physician Name and Address:  Josette Ade, MD  Relative Name and Phone Number:       Current Level of Care: Hospital Recommended Level of Care: Skilled Nursing Facility Prior Approval Number:    Date Approved/Denied:   PASRR Number: [id number]  Discharge Plan: SNF    Current Diagnoses: Patient Active Problem List   Diagnosis Date Noted   Hip fracture, unspecified laterality, closed, initial encounter (HCC) 03/13/2024   Hip fracture (HCC) 03/13/2024   Osteomyelitis, jaw chronic 06/13/2023   Chronic systolic CHF (congestive heart failure) (HCC) 06/13/2023   OSA (obstructive sleep apnea) 06/13/2023   COPD (chronic obstructive pulmonary disease) (HCC) 06/13/2023   Facial cellulitis 02/06/2023   HLD (hyperlipidemia) 06/20/2022   Atrial fibrillation, chronic (HCC) 06/20/2022   AMS (altered mental status) 04/12/2021   Shortness of breath 04/11/2021   Altered mental status 04/11/2021   History of atrial fibrillation 02/25/2020   Pacemaker 02/25/2020   Acute on chronic diastolic CHF (congestive heart failure) (HCC) 02/25/2020   Complete heart block (HCC) 01/24/2020   Injury    S/P ORIF (open reduction internal fixation) fracture    Hypotension due to hypovolemia    Closed displaced fracture of right tibial tuberosity    Pain    Syncope and collapse 12/06/2019   Status post total hip replacement, left 02/09/2018   Primary osteoarthritis of left hip 01/06/2018   Chronic pain syndrome 04/09/2017   Other specified health status 04/08/2017   Disorder of bone, unspecified 04/08/2017   Long term current use  of opiate analgesic 04/08/2017   Chronic low back pain (Primary Area of Pain) (Bilateral) (L>R) 04/08/2017   Chronic pain of both lower extremities  (Tertiary Area of Pain) 04/08/2017   Chronic groin pain (Secondary Area of Pain) 04/08/2017   Chronic neck pain (Fourth Area of Pain) (Bilateral) (L>R) 04/08/2017   Chronic sacroiliac joint pain 04/08/2017   Other long term (current) drug therapy 04/08/2017   Syncope 02/09/2017   Asthmatic bronchitis 01/26/2017   Fall 10/06/2016   Anxiety 08/08/2016   Acute on chronic respiratory failure with hypoxia (HCC) 07/26/2016   Acute on chronic systolic CHF (congestive heart failure) (HCC) 07/26/2016   TIA (transient ischemic attack) 07/25/2015   Sick sinus syndrome (HCC) 03/14/2015   CAD (coronary artery disease) 02/23/2015   Chronic heart failure (HCC) 01/05/2015   Fracture dislocation of ankle 07/27/2014   IDDM (insulin  dependent diabetes mellitus) 07/27/2014   Essential hypertension 07/27/2014   Multiple rib fractures 07/27/2014   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   Anemia 12/06/2013   Type 2 diabetes mellitus with diabetic polyneuropathy, with long-term current use of insulin  (HCC) 12/06/2013    Orientation RESPIRATION BLADDER Height & Weight     Situation, Self, Place  O2 (2 L) Incontinent Weight: 155 lb 3.3 oz (70.4 kg) Height:  4' 7 (139.7 cm)  BEHAVIORAL SYMPTOMS/MOOD NEUROLOGICAL BOWEL NUTRITION STATUS      Continent Diet  AMBULATORY STATUS COMMUNICATION OF NEEDS Skin   Extensive Assist Verbally Normal  Personal Care Assistance Level of Assistance  Feeding, Bathing, Dressing Bathing Assistance: Limited assistance Feeding assistance: Independent Dressing Assistance: Limited assistance     Functional Limitations Info  Sight, Hearing, Speech Sight Info: Adequate Hearing Info: Impaired Speech Info: Impaired    SPECIAL CARE FACTORS FREQUENCY  PT (By licensed PT), OT (By licensed OT)     PT  Frequency: 5x/week OT Frequency: 5x/week            Contractures      Additional Factors Info  Code Status, Allergies Code Status Info: DNR Limited Allergies Info: Ambien (Zolpidem Tartrate), Penicillins, Iodine, Succinylcholine, Etodolac, Nsaids           Current Medications (03/16/2024):  This is the current hospital active medication list Current Facility-Administered Medications  Medication Dose Route Frequency Provider Last Rate Last Admin   acetaminophen  (TYLENOL ) tablet 650 mg  650 mg Oral Q6H PRN Laurita Manor T, MD       albuterol  (PROVENTIL ) (2.5 MG/3ML) 0.083% nebulizer solution 3 mL  3 mL Inhalation Q6H PRN Laurita Manor T, MD       apixaban  (ELIQUIS ) tablet 5 mg  5 mg Oral BID Laurita Manor T, MD   5 mg at 03/15/24 2043   carvedilol  (COREG ) tablet 6.25 mg  6.25 mg Oral BID WC Laurita Manor T, MD   6.25 mg at 03/15/24 0957   empagliflozin  (JARDIANCE ) tablet 10 mg  10 mg Oral Daily Zhang, Ping T, MD   10 mg at 03/15/24 9043   ezetimibe  (ZETIA ) tablet 10 mg  10 mg Oral Daily Zhang, Ping T, MD   10 mg at 03/15/24 0957   feeding supplement (ENSURE PLUS HIGH PROTEIN) liquid 237 mL  237 mL Oral BID BM Sreenath, Sudheer B, MD   237 mL at 03/15/24 1339   furosemide  (LASIX ) tablet 20 mg  20 mg Oral Daily Zhang, Ping T, MD   20 mg at 03/15/24 9043   gabapentin  (NEURONTIN ) capsule 200 mg  200 mg Oral TID Laurita Manor T, MD   200 mg at 03/15/24 2043   HYDROcodone -acetaminophen  (NORCO/VICODIN) 5-325 MG per tablet 1-2 tablet  1-2 tablet Oral Q6H PRN Laurita Manor T, MD   1 tablet at 03/15/24 2314   HYDROmorphone  (DILAUDID ) injection 0.5 mg  0.5 mg Intravenous Q2H PRN Laurita Manor T, MD       Influenza vac split trivalent PF (FLUZONE HIGH-DOSE) injection 0.5 mL  0.5 mL Intramuscular Tomorrow-1000 Laurita Manor T, MD       insulin  aspart (novoLOG ) injection 0-9 Units  0-9 Units Subcutaneous TID WC Laurita Manor DASEN, MD   3 Units at 03/15/24 1748   ipratropium-albuterol  (DUONEB) 0.5-2.5 (3) MG/3ML  nebulizer solution 3 mL  3 mL Nebulization BID Jhonny Sahara B, MD   3 mL at 03/16/24 0835   multivitamin with minerals tablet 1 tablet  1 tablet Oral Daily Sreenath, Sudheer B, MD   1 tablet at 03/15/24 9043   mupirocin  ointment (BACTROBAN ) 2 % 1 Application  1 Application Nasal BID Laurita Manor DASEN, MD   1 Application at 03/15/24 2043   ondansetron  (ZOFRAN ) injection 4 mg  4 mg Intravenous Q6H PRN Laurita Manor T, MD       pantoprazole  (PROTONIX ) EC tablet 40 mg  40 mg Oral Daily Laurita Manor T, MD   40 mg at 03/15/24 0957   polyethylene glycol (MIRALAX  / GLYCOLAX ) packet 17 g  17 g Oral Daily PRN Laurita Manor DASEN, MD  pravastatin  (PRAVACHOL ) tablet 40 mg  40 mg Oral QHS Laurita Manor T, MD   40 mg at 03/15/24 2043   sacubitril -valsartan  (ENTRESTO ) 24-26 mg per tablet  1 tablet Oral BID Laurita Manor DASEN, MD   1 tablet at 03/15/24 2043     Discharge Medications: Please see discharge summary for a list of discharge medications.  Relevant Imaging Results:  Relevant Lab Results:   Additional Information SSN: [id number]  Alvaro Louder, LCSW

## 2024-03-16 NOTE — TOC Initial Note (Signed)
 Transition of Care [facility]) - Initial/Assessment Note    Patient Details  Name: Amber [name] MRN: [id number] Date of Birth: 1929-10-01  [facility] CM/SW Contact:    Alvaro Louder, LCSW Phone Number: 03/16/2024, 9:10 AM  Clinical Narrative:     ISRA[facility] Cowboy out information to SNF's in [place]. Family prefers [facility]. LCSWA will present Facilities to patient at the bedside.        TOC to follow for discharge       Patient Goals and CMS Choice            Expected Discharge Plan and Services         Expected Discharge Date: 03/15/24                                    Prior Living Arrangements/Services                       Activities of Daily Living   ADL Screening (condition at time of admission) Independently performs ADLs?: No Does the patient have a NEW difficulty with bathing/dressing/toileting/self-feeding that is expected to last >3 days?: No Does the patient have a NEW difficulty with getting in/out of bed, walking, or climbing stairs that is expected to last >3 days?: No Does the patient have a NEW difficulty with communication that is expected to last >3 days?: No Is the patient deaf or have difficulty hearing?: No Does the patient have difficulty seeing, even when wearing glasses/contacts?: No Does the patient have difficulty concentrating, remembering, or making decisions?: No  Permission Sought/Granted                  Emotional Assessment              Admission diagnosis:  Hip fracture (HCC) [S72.009A] Closed nondisplaced fracture of greater trochanter of left femur, initial encounter (HCC) [S72.115A] Patient Active Problem List   Diagnosis Date Noted   Hip fracture, unspecified laterality, closed, initial encounter (HCC) 03/13/2024   Hip fracture (HCC) 03/13/2024   Osteomyelitis, jaw chronic 06/13/2023   Chronic systolic CHF (congestive heart failure) (HCC) 06/13/2023   OSA (obstructive sleep  apnea) 06/13/2023   COPD (chronic obstructive pulmonary disease) (HCC) 06/13/2023   Facial cellulitis 02/06/2023   HLD (hyperlipidemia) 06/20/2022   Atrial fibrillation, chronic (HCC) 06/20/2022   AMS (altered mental status) 04/12/2021   Shortness of breath 04/11/2021   Altered mental status 04/11/2021   History of atrial fibrillation 02/25/2020   Pacemaker 02/25/2020   Acute on chronic diastolic CHF (congestive heart failure) (HCC) 02/25/2020   Complete heart block (HCC) 01/24/2020   Injury    S/P ORIF (open reduction internal fixation) fracture    Hypotension due to hypovolemia    Closed displaced fracture of right tibial tuberosity    Pain    Syncope and collapse 12/06/2019   Status post total hip replacement, left 02/09/2018   Primary osteoarthritis of left hip 01/06/2018   Chronic pain syndrome 04/09/2017   Other specified health status 04/08/2017   Disorder of bone, unspecified 04/08/2017   Long term current use of opiate analgesic 04/08/2017   Chronic low back pain (Primary Area of Pain) (Bilateral) (L>R) 04/08/2017   Chronic pain of both lower extremities  (Tertiary Area of Pain) 04/08/2017   Chronic groin pain (Secondary Area of Pain) 04/08/2017   Chronic neck pain (Fourth Area of  Pain) (Bilateral) (L>R) 04/08/2017   Chronic sacroiliac joint pain 04/08/2017   Other long term (current) drug therapy 04/08/2017   Syncope 02/09/2017   Asthmatic bronchitis 01/26/2017   Fall 10/06/2016   Anxiety 08/08/2016   Acute on chronic respiratory failure with hypoxia (HCC) 07/26/2016   Acute on chronic systolic CHF (congestive heart failure) (HCC) 07/26/2016   TIA (transient ischemic attack) 07/25/2015   Sick sinus syndrome (HCC) 03/14/2015   CAD (coronary artery disease) 02/23/2015   Chronic heart failure (HCC) 01/05/2015   Fracture dislocation of ankle 07/27/2014   IDDM (insulin  dependent diabetes mellitus) 07/27/2014   Essential hypertension 07/27/2014   Multiple rib fractures  07/27/2014   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   Anemia 12/06/2013   Type 2 diabetes mellitus with diabetic polyneuropathy, with long-term current use of insulin  (HCC) 12/06/2013   PCP:  [name], MD Pharmacy:   [facility] [address] [place] - [address] [place] [address] Phone: [phone] Fax: [phone]  [facility] [address] ([address]), [place] - [address] [place] ([place]) [place] [address] Phone: [phone] Fax: [phone]  [facility] [address] [place] [address] Phone: [phone] Fax: [phone]     Social Drivers of Health (SDOH) Social History: SDOH Screenings   Food Insecurity: No Food Insecurity (03/13/2024)  Housing: Low Risk  (03/13/2024)  Transportation Needs: No Transportation Needs (03/13/2024)  Utilities: Not At Risk (03/13/2024)  Depression (PHQ2-9): Low Risk  (06/25/2023)  Financial Resource Strain: Low Risk  (08/25/2023)   Received from [facility]  Social Connections: Moderately Integrated (03/13/2024)  Tobacco Use: Low Risk  (03/13/2024)   SDOH Interventions:     Readmission Risk Interventions     No data to display

## 2024-03-16 NOTE — Assessment & Plan Note (Addendum)
 Hemoglobin 9.4.

## 2024-03-16 NOTE — Assessment & Plan Note (Addendum)
 Seen by orthopedic and recommended nonoperative management.  PT and OT consultations appreciated, pain controlled.  Patient will need rehab.  Patient on Eliquis  for anticoagulation.  Obtained insurance authorization.

## 2024-03-16 NOTE — Plan of Care (Signed)
  Problem: Metabolic: Goal: Ability to maintain appropriate glucose levels will improve Outcome: Progressing   Problem: Clinical Measurements: Goal: Ability to maintain clinical measurements within normal limits will improve Outcome: Progressing   Problem: Activity: Goal: Risk for activity intolerance will decrease Outcome: Progressing   Problem: Elimination: Goal: Will not experience complications related to bowel motility Outcome: Progressing Goal: Will not experience complications related to urinary retention Outcome: Progressing   Problem: Pain Managment: Goal: General experience of comfort will improve and/or be controlled Outcome: Progressing   Problem: Safety: Goal: Ability to remain free from injury will improve Outcome: Progressing   Problem: Skin Integrity: Goal: Risk for impaired skin integrity will decrease Outcome: Progressing

## 2024-03-16 NOTE — Assessment & Plan Note (Signed)
 Last echo is 30% EF.  Patient on Jardiance , low-dose Lasix  and Entresto
